# Patient Record
Sex: Male | Born: 1935
Health system: Southern US, Community
[De-identification: ages and names within clinical notes are randomized; demographics above are authoritative.]

## PROBLEM LIST (undated history)

## (undated) DIAGNOSIS — J90 Pleural effusion, not elsewhere classified: Secondary | ICD-10-CM

## (undated) DIAGNOSIS — K449 Diaphragmatic hernia without obstruction or gangrene: Secondary | ICD-10-CM

## (undated) DIAGNOSIS — K5790 Diverticulosis of intestine, part unspecified, without perforation or abscess without bleeding: Secondary | ICD-10-CM

## (undated) DIAGNOSIS — E039 Hypothyroidism, unspecified: Secondary | ICD-10-CM

## (undated) DIAGNOSIS — I480 Paroxysmal atrial fibrillation: Secondary | ICD-10-CM

## (undated) DIAGNOSIS — T4145XA Adverse effect of unspecified anesthetic, initial encounter: Secondary | ICD-10-CM

## (undated) DIAGNOSIS — Z860100 Personal history of colon polyps, unspecified: Secondary | ICD-10-CM

## (undated) DIAGNOSIS — I2510 Atherosclerotic heart disease of native coronary artery without angina pectoris: Secondary | ICD-10-CM

## (undated) DIAGNOSIS — Z8679 Personal history of other diseases of the circulatory system: Secondary | ICD-10-CM

## (undated) DIAGNOSIS — C61 Malignant neoplasm of prostate: Secondary | ICD-10-CM

## (undated) DIAGNOSIS — I10 Essential (primary) hypertension: Secondary | ICD-10-CM

## (undated) DIAGNOSIS — Z9289 Personal history of other medical treatment: Secondary | ICD-10-CM

## (undated) DIAGNOSIS — Z951 Presence of aortocoronary bypass graft: Secondary | ICD-10-CM

## (undated) DIAGNOSIS — K589 Irritable bowel syndrome without diarrhea: Secondary | ICD-10-CM

## (undated) DIAGNOSIS — T8859XA Other complications of anesthesia, initial encounter: Secondary | ICD-10-CM

## (undated) DIAGNOSIS — Z9889 Other specified postprocedural states: Secondary | ICD-10-CM

## (undated) HISTORY — DX: Personal history of colon polyps, unspecified: Z86.0100

## (undated) HISTORY — DX: Personal history of other medical treatment: Z92.89

## (undated) HISTORY — DX: Irritable bowel syndrome, unspecified: K58.9

## (undated) HISTORY — PX: PROSTATE BIOPSY: SHX241

## (undated) HISTORY — DX: Diaphragmatic hernia without obstruction or gangrene: K44.9

## (undated) HISTORY — DX: Essential (primary) hypertension: I10

## (undated) HISTORY — DX: Personal history of colonic polyps: Z86.010

## (undated) HISTORY — DX: Malignant neoplasm of prostate: C61

## (undated) HISTORY — DX: Diverticulosis of intestine, part unspecified, without perforation or abscess without bleeding: K57.90

## (undated) HISTORY — PX: HEMORRHOID SURGERY: SHX153

## (undated) HISTORY — DX: Hypothyroidism, unspecified: E03.9

---

## 1989-02-22 DIAGNOSIS — C61 Malignant neoplasm of prostate: Secondary | ICD-10-CM

## 1989-02-22 HISTORY — DX: Malignant neoplasm of prostate: C61

## 2000-09-01 ENCOUNTER — Other Ambulatory Visit: Admission: RE | Admit: 2000-09-01 | Discharge: 2000-09-01 | Payer: Self-pay | Admitting: Urology

## 2000-09-01 ENCOUNTER — Encounter (INDEPENDENT_AMBULATORY_CARE_PROVIDER_SITE_OTHER): Payer: Self-pay | Admitting: Specialist

## 2000-09-12 ENCOUNTER — Other Ambulatory Visit: Admission: RE | Admit: 2000-09-12 | Discharge: 2000-09-12 | Payer: Self-pay | Admitting: Gastroenterology

## 2000-09-12 ENCOUNTER — Encounter (INDEPENDENT_AMBULATORY_CARE_PROVIDER_SITE_OTHER): Payer: Self-pay

## 2000-12-24 ENCOUNTER — Encounter: Payer: Self-pay | Admitting: Emergency Medicine

## 2000-12-24 ENCOUNTER — Emergency Department (HOSPITAL_COMMUNITY): Admission: EM | Admit: 2000-12-24 | Discharge: 2000-12-24 | Payer: Self-pay | Admitting: Emergency Medicine

## 2001-10-27 ENCOUNTER — Encounter: Payer: Self-pay | Admitting: General Surgery

## 2001-11-02 ENCOUNTER — Encounter (INDEPENDENT_AMBULATORY_CARE_PROVIDER_SITE_OTHER): Payer: Self-pay | Admitting: *Deleted

## 2001-11-02 ENCOUNTER — Ambulatory Visit (HOSPITAL_COMMUNITY): Admission: RE | Admit: 2001-11-02 | Discharge: 2001-11-02 | Payer: Self-pay | Admitting: General Surgery

## 2002-07-28 ENCOUNTER — Encounter: Payer: Self-pay | Admitting: *Deleted

## 2002-07-28 ENCOUNTER — Emergency Department (HOSPITAL_COMMUNITY): Admission: EM | Admit: 2002-07-28 | Discharge: 2002-07-28 | Payer: Self-pay | Admitting: *Deleted

## 2002-07-29 ENCOUNTER — Emergency Department (HOSPITAL_COMMUNITY): Admission: EM | Admit: 2002-07-29 | Discharge: 2002-07-29 | Payer: Self-pay | Admitting: Emergency Medicine

## 2002-07-29 ENCOUNTER — Encounter: Payer: Self-pay | Admitting: Emergency Medicine

## 2004-02-26 ENCOUNTER — Ambulatory Visit: Payer: Self-pay | Admitting: Family Medicine

## 2004-05-09 ENCOUNTER — Emergency Department (HOSPITAL_COMMUNITY): Admission: EM | Admit: 2004-05-09 | Discharge: 2004-05-09 | Payer: Self-pay | Admitting: Emergency Medicine

## 2004-06-02 ENCOUNTER — Ambulatory Visit: Payer: Self-pay | Admitting: Family Medicine

## 2004-07-16 ENCOUNTER — Ambulatory Visit: Payer: Self-pay | Admitting: Family Medicine

## 2004-08-04 ENCOUNTER — Ambulatory Visit: Payer: Self-pay | Admitting: Family Medicine

## 2004-08-13 ENCOUNTER — Ambulatory Visit: Payer: Self-pay | Admitting: Family Medicine

## 2004-12-31 ENCOUNTER — Ambulatory Visit: Payer: Self-pay | Admitting: Family Medicine

## 2005-07-29 ENCOUNTER — Ambulatory Visit: Payer: Self-pay | Admitting: Family Medicine

## 2005-08-05 ENCOUNTER — Ambulatory Visit: Payer: Self-pay | Admitting: Family Medicine

## 2005-12-17 ENCOUNTER — Ambulatory Visit: Payer: Self-pay | Admitting: Family Medicine

## 2006-07-21 ENCOUNTER — Ambulatory Visit: Payer: Self-pay | Admitting: Family Medicine

## 2006-07-21 LAB — CONVERTED CEMR LAB
ALT: 20 units/L (ref 0–40)
AST: 19 units/L (ref 0–37)
Albumin: 4.1 g/dL (ref 3.5–5.2)
Alkaline Phosphatase: 56 units/L (ref 39–117)
BUN: 11 mg/dL (ref 6–23)
Basophils Absolute: 0 10*3/uL (ref 0.0–0.1)
Basophils Relative: 0.4 % (ref 0.0–1.0)
Bilirubin, Direct: 0.1 mg/dL (ref 0.0–0.3)
CO2: 31 meq/L (ref 19–32)
Calcium: 9.2 mg/dL (ref 8.4–10.5)
Chloride: 106 meq/L (ref 96–112)
Cholesterol: 233 mg/dL (ref 0–200)
Creatinine, Ser: 1 mg/dL (ref 0.4–1.5)
Direct LDL: 163.8 mg/dL
Eosinophils Absolute: 0.1 10*3/uL (ref 0.0–0.6)
Eosinophils Relative: 1.5 % (ref 0.0–5.0)
GFR calc Af Amer: 93 mL/min
GFR calc non Af Amer: 77 mL/min
Glucose, Bld: 94 mg/dL (ref 70–99)
HCT: 48.3 % (ref 39.0–52.0)
HDL: 31.1 mg/dL — ABNORMAL LOW (ref >39.0)
Hemoglobin: 16.6 g/dL (ref 13.0–17.0)
Lymphocytes Relative: 32.2 % (ref 12.0–46.0)
MCHC: 34.3 g/dL (ref 30.0–36.0)
MCV: 90.2 fL (ref 78.0–100.0)
Monocytes Absolute: 0.5 10*3/uL (ref 0.2–0.7)
Monocytes Relative: 8.7 % (ref 3.0–11.0)
Neutro Abs: 3.6 10*3/uL (ref 1.4–7.7)
Neutrophils Relative %: 57.2 % (ref 43.0–77.0)
PSA: 12.47 ng/mL — ABNORMAL HIGH (ref 0.10–4.00)
Platelets: 209 10*3/uL (ref 150–400)
Potassium: 3.9 meq/L (ref 3.5–5.1)
RBC: 5.36 M/uL (ref 4.22–5.81)
RDW: 12.5 % (ref 11.5–14.6)
Sodium: 142 meq/L (ref 135–145)
TSH: 3.04 microintl units/mL (ref 0.35–5.50)
Total Bilirubin: 1.2 mg/dL (ref 0.3–1.2)
Total CHOL/HDL Ratio: 7.5
Total Protein: 6.9 g/dL (ref 6.0–8.3)
Triglycerides: 189 mg/dL — ABNORMAL HIGH (ref 0–149)
VLDL: 38 mg/dL (ref 0–40)
WBC: 6.2 10*3/uL (ref 4.5–10.5)

## 2006-09-05 DIAGNOSIS — Z860100 Personal history of colon polyps, unspecified: Secondary | ICD-10-CM | POA: Insufficient documentation

## 2006-09-05 DIAGNOSIS — M129 Arthropathy, unspecified: Secondary | ICD-10-CM | POA: Insufficient documentation

## 2006-09-05 DIAGNOSIS — I10 Essential (primary) hypertension: Secondary | ICD-10-CM | POA: Insufficient documentation

## 2006-12-15 ENCOUNTER — Ambulatory Visit: Payer: Self-pay | Admitting: Family Medicine

## 2006-12-15 DIAGNOSIS — E785 Hyperlipidemia, unspecified: Secondary | ICD-10-CM | POA: Insufficient documentation

## 2006-12-15 DIAGNOSIS — R972 Elevated prostate specific antigen [PSA]: Secondary | ICD-10-CM | POA: Insufficient documentation

## 2006-12-16 LAB — CONVERTED CEMR LAB
Cholesterol: 191 mg/dL (ref 0–200)
HDL: 31.4 mg/dL — ABNORMAL LOW (ref >39.0)
LDL Cholesterol: 134 mg/dL — ABNORMAL HIGH (ref 0–99)
PSA: 12.56 ng/mL — ABNORMAL HIGH (ref 0.10–4.00)
Total CHOL/HDL Ratio: 6.1
Triglycerides: 126 mg/dL (ref 0–149)
VLDL: 25 mg/dL (ref 0–40)

## 2007-05-17 ENCOUNTER — Ambulatory Visit: Payer: Self-pay | Admitting: Family Medicine

## 2007-05-18 LAB — CONVERTED CEMR LAB
Cholesterol: 215 mg/dL (ref 0–200)
Direct LDL: 159.3 mg/dL
HDL: 33 mg/dL — ABNORMAL LOW (ref >39.0)
PSA: 13.37 ng/mL — ABNORMAL HIGH (ref 0.10–4.00)
Total CHOL/HDL Ratio: 6.5
Triglycerides: 137 mg/dL (ref 0–149)
VLDL: 27 mg/dL (ref 0–40)

## 2007-08-02 ENCOUNTER — Other Ambulatory Visit: Payer: Self-pay | Admitting: Family Medicine

## 2007-08-02 ENCOUNTER — Ambulatory Visit: Payer: Self-pay | Admitting: Family Medicine

## 2007-08-02 DIAGNOSIS — M949 Disorder of cartilage, unspecified: Secondary | ICD-10-CM

## 2007-08-02 DIAGNOSIS — T50995A Adverse effect of other drugs, medicaments and biological substances, initial encounter: Secondary | ICD-10-CM | POA: Insufficient documentation

## 2007-08-02 DIAGNOSIS — D649 Anemia, unspecified: Secondary | ICD-10-CM | POA: Insufficient documentation

## 2007-08-02 DIAGNOSIS — M899 Disorder of bone, unspecified: Secondary | ICD-10-CM | POA: Insufficient documentation

## 2007-08-02 DIAGNOSIS — K219 Gastro-esophageal reflux disease without esophagitis: Secondary | ICD-10-CM | POA: Insufficient documentation

## 2007-08-02 DIAGNOSIS — R350 Frequency of micturition: Secondary | ICD-10-CM | POA: Insufficient documentation

## 2007-08-02 DIAGNOSIS — E039 Hypothyroidism, unspecified: Secondary | ICD-10-CM | POA: Insufficient documentation

## 2007-08-02 LAB — CONVERTED CEMR LAB
Bilirubin Urine: NEGATIVE
Blood in Urine, dipstick: NEGATIVE
Glucose, Urine, Semiquant: NEGATIVE
Ketones, urine, test strip: NEGATIVE
Nitrite: NEGATIVE
PSA: ABNORMAL ng/mL
Protein, U semiquant: NEGATIVE
Specific Gravity, Urine: 1.025
Urobilinogen, UA: 0.2
WBC Urine, dipstick: NEGATIVE
pH: 5

## 2007-08-04 LAB — CONVERTED CEMR LAB
AST: 22 units/L (ref 0–37)
Basophils Absolute: 0 10*3/uL (ref 0.0–0.1)
Basophils Relative: 0.1 % (ref 0.0–1.0)
Bilirubin, Direct: 0.1 mg/dL (ref 0.0–0.3)
Calcium: 8.8 mg/dL (ref 8.4–10.5)
Chloride: 106 meq/L (ref 96–112)
Cholesterol: 181 mg/dL (ref 0–200)
Creatinine, Ser: 1 mg/dL (ref 0.4–1.5)
Eosinophils Absolute: 0.1 10*3/uL (ref 0.0–0.7)
GFR calc non Af Amer: 77 mL/min
HDL: 30.4 mg/dL — ABNORMAL LOW (ref >39.0)
LDL Cholesterol: 127 mg/dL — ABNORMAL HIGH (ref 0–99)
MCHC: 35.9 g/dL (ref 30.0–36.0)
MCV: 90.7 fL (ref 78.0–100.0)
Neutrophils Relative %: 58.9 % (ref 43.0–77.0)
PSA: 13.01 ng/mL — ABNORMAL HIGH (ref 0.10–4.00)
RBC: 4.79 M/uL (ref 4.22–5.81)
RDW: 12.3 % (ref 11.5–14.6)
Sodium: 140 meq/L (ref 135–145)
TSH: 2.49 microintl units/mL (ref 0.35–5.50)
Total Bilirubin: 1 mg/dL (ref 0.3–1.2)
Triglycerides: 117 mg/dL (ref 0–149)
VLDL: 23 mg/dL (ref 0–40)

## 2007-08-11 ENCOUNTER — Ambulatory Visit: Payer: Self-pay | Admitting: Family Medicine

## 2007-08-11 LAB — CONVERTED CEMR LAB
OCCULT 1: POSITIVE
OCCULT 2: POSITIVE
OCCULT 3: NEGATIVE

## 2007-08-31 ENCOUNTER — Ambulatory Visit: Payer: Self-pay | Admitting: Family Medicine

## 2007-09-05 ENCOUNTER — Encounter: Payer: Self-pay | Admitting: Family Medicine

## 2007-11-21 ENCOUNTER — Telehealth: Payer: Self-pay | Admitting: Family Medicine

## 2007-12-06 ENCOUNTER — Ambulatory Visit: Payer: Self-pay | Admitting: Family Medicine

## 2008-07-29 ENCOUNTER — Ambulatory Visit: Payer: Self-pay | Admitting: Family Medicine

## 2008-08-08 ENCOUNTER — Ambulatory Visit: Payer: Self-pay | Admitting: Family Medicine

## 2008-08-08 DIAGNOSIS — M722 Plantar fascial fibromatosis: Secondary | ICD-10-CM | POA: Insufficient documentation

## 2008-08-08 DIAGNOSIS — R011 Cardiac murmur, unspecified: Secondary | ICD-10-CM | POA: Insufficient documentation

## 2008-08-08 LAB — CONVERTED CEMR LAB
ALT: 17 U/L (ref 0–53)
AST: 17 U/L (ref 0–37)
Albumin: 3.6 g/dL (ref 3.5–5.2)
Alkaline Phosphatase: 56 U/L (ref 39–117)
BUN: 17 mg/dL (ref 6–23)
Basophils Absolute: 0 K/uL (ref 0.0–0.1)
Basophils Relative: 0.2 % (ref 0.0–3.0)
Bilirubin Urine: NEGATIVE
Bilirubin, Direct: 0.1 mg/dL (ref 0.0–0.3)
CO2: 29 meq/L (ref 19–32)
Calcium: 8.8 mg/dL (ref 8.4–10.5)
Chloride: 111 meq/L (ref 96–112)
Cholesterol: 189 mg/dL (ref 0–200)
Creatinine, Ser: 0.9 mg/dL (ref 0.4–1.5)
Eosinophils Absolute: 0.1 K/uL (ref 0.0–0.7)
Eosinophils Relative: 2 % (ref 0.0–5.0)
GFR calc non Af Amer: 86.55 mL/min (ref >60)
Glucose, Bld: 87 mg/dL (ref 70–99)
HCT: 45.5 % (ref 39.0–52.0)
HDL: 34.9 mg/dL — ABNORMAL LOW (ref >39.00)
Hemoglobin: 16.1 g/dL (ref 13.0–17.0)
Ketones, ur: NEGATIVE mg/dL
LDL Cholesterol: 128 mg/dL — ABNORMAL HIGH (ref 0–99)
Leukocytes, UA: NEGATIVE
Lymphocytes Relative: 37.6 % (ref 12.0–46.0)
Lymphs Abs: 2.1 K/uL (ref 0.7–4.0)
MCHC: 35.5 g/dL (ref 30.0–36.0)
MCV: 89.8 fL (ref 78.0–100.0)
Monocytes Absolute: 0.5 K/uL (ref 0.1–1.0)
Monocytes Relative: 8.3 % (ref 3.0–12.0)
Neutro Abs: 2.8 K/uL (ref 1.4–7.7)
Neutrophils Relative %: 51.9 % (ref 43.0–77.0)
Nitrite: NEGATIVE
PSA: 18.38 ng/mL — ABNORMAL HIGH (ref 0.10–4.00)
Platelets: 170 K/uL (ref 150.0–400.0)
Potassium: 4.2 meq/L (ref 3.5–5.1)
RBC: 5.06 M/uL (ref 4.22–5.81)
RDW: 12.2 % (ref 11.5–14.6)
Sodium: 143 meq/L (ref 135–145)
Specific Gravity, Urine: >=1.03 (ref 1.000–1.030)
TSH: 2.93 u[IU]/mL (ref 0.35–5.50)
Total Bilirubin: 1.1 mg/dL (ref 0.3–1.2)
Total CHOL/HDL Ratio: 5
Total Protein, Urine: NEGATIVE mg/dL
Total Protein: 6.7 g/dL (ref 6.0–8.3)
Triglycerides: 132 mg/dL (ref 0.0–149.0)
Urine Glucose: NEGATIVE mg/dL
Urobilinogen, UA: 0.2 (ref 0.0–1.0)
VLDL: 26.4 mg/dL (ref 0.0–40.0)
WBC: 5.5 10*3/microliter (ref 4.5–10.5)
pH: 5.5 (ref 5.0–8.0)

## 2008-08-08 LAB — HM COLONOSCOPY

## 2008-08-13 ENCOUNTER — Encounter: Payer: Self-pay | Admitting: Gastroenterology

## 2008-08-21 ENCOUNTER — Ambulatory Visit: Payer: Self-pay

## 2008-08-21 ENCOUNTER — Encounter: Payer: Self-pay | Admitting: Family Medicine

## 2008-09-04 ENCOUNTER — Ambulatory Visit: Payer: Self-pay | Admitting: Family Medicine

## 2008-09-04 LAB — CONVERTED CEMR LAB
OCCULT 1: NEGATIVE
OCCULT 3: NEGATIVE

## 2008-11-06 ENCOUNTER — Telehealth: Payer: Self-pay | Admitting: Family Medicine

## 2009-01-01 ENCOUNTER — Encounter (INDEPENDENT_AMBULATORY_CARE_PROVIDER_SITE_OTHER): Payer: Self-pay | Admitting: *Deleted

## 2009-01-22 ENCOUNTER — Encounter: Payer: Self-pay | Admitting: Family Medicine

## 2009-02-04 ENCOUNTER — Telehealth: Payer: Self-pay | Admitting: Family Medicine

## 2009-02-05 ENCOUNTER — Ambulatory Visit: Payer: Self-pay | Admitting: Family Medicine

## 2009-02-07 ENCOUNTER — Telehealth (INDEPENDENT_AMBULATORY_CARE_PROVIDER_SITE_OTHER): Payer: Self-pay | Admitting: *Deleted

## 2009-02-18 ENCOUNTER — Encounter: Payer: Self-pay | Admitting: Family Medicine

## 2009-02-22 HISTORY — PX: COLONOSCOPY: SHX174

## 2009-02-24 ENCOUNTER — Encounter: Payer: Self-pay | Admitting: Family Medicine

## 2009-03-11 ENCOUNTER — Encounter: Payer: Self-pay | Admitting: Family Medicine

## 2009-03-11 LAB — CONVERTED CEMR LAB
CO2: 31 meq/L (ref 19–32)
Calcium: 9.5 mg/dL (ref 8.4–10.5)
GFR calc non Af Amer: 99.02 mL/min (ref >60)
Sodium: 140 meq/L (ref 135–145)

## 2009-06-20 ENCOUNTER — Ambulatory Visit: Payer: Self-pay | Admitting: Family Medicine

## 2009-06-20 LAB — CONVERTED CEMR LAB: PSA: 22.56 ng/mL — ABNORMAL HIGH (ref 0.10–4.00)

## 2009-08-06 ENCOUNTER — Ambulatory Visit: Payer: Self-pay | Admitting: Family Medicine

## 2009-08-13 ENCOUNTER — Ambulatory Visit: Payer: Self-pay | Admitting: Family Medicine

## 2009-08-13 DIAGNOSIS — L723 Sebaceous cyst: Secondary | ICD-10-CM | POA: Insufficient documentation

## 2009-08-13 DIAGNOSIS — R059 Cough, unspecified: Secondary | ICD-10-CM | POA: Insufficient documentation

## 2009-08-13 DIAGNOSIS — R05 Cough: Secondary | ICD-10-CM

## 2009-08-13 DIAGNOSIS — C61 Malignant neoplasm of prostate: Secondary | ICD-10-CM | POA: Insufficient documentation

## 2009-08-13 LAB — CONVERTED CEMR LAB
Bilirubin Urine: NEGATIVE
Bilirubin, Direct: 0.1 mg/dL (ref 0.0–0.3)
Cholesterol: 178 mg/dL (ref 0–200)
Eosinophils Absolute: 0.1 10*3/uL (ref 0.0–0.7)
GFR calc non Af Amer: 90.98 mL/min (ref >60)
HDL: 35.9 mg/dL — ABNORMAL LOW (ref >39.00)
Leukocytes, UA: NEGATIVE
Lymphs Abs: 2.1 10*3/uL (ref 0.7–4.0)
MCHC: 34.9 g/dL (ref 30.0–36.0)
MCV: 92.5 fL (ref 78.0–100.0)
Monocytes Absolute: 0.5 10*3/uL (ref 0.1–1.0)
Neutrophils Relative %: 53.5 % (ref 43.0–77.0)
Nitrite: NEGATIVE
PSA: 23.38 ng/mL — ABNORMAL HIGH (ref 0.10–4.00)
Platelets: 185 10*3/uL (ref 150.0–400.0)
Potassium: 4.3 meq/L (ref 3.5–5.1)
Sodium: 142 meq/L (ref 135–145)
TSH: 3.67 microintl units/mL (ref 0.35–5.50)
Total Bilirubin: 0.8 mg/dL (ref 0.3–1.2)
Total Protein: 6.3 g/dL (ref 6.0–8.3)
Triglycerides: 153 mg/dL — ABNORMAL HIGH (ref 0.0–149.0)
Urobilinogen, UA: 0.2 (ref 0.0–1.0)
VLDL: 30.6 mg/dL (ref 0.0–40.0)
pH: 6 (ref 5.0–8.0)

## 2009-08-29 ENCOUNTER — Ambulatory Visit: Payer: Self-pay | Admitting: Family Medicine

## 2009-10-16 ENCOUNTER — Ambulatory Visit: Payer: Self-pay | Admitting: Family Medicine

## 2009-10-21 LAB — CONVERTED CEMR LAB: OCCULT 3: NEGATIVE

## 2009-12-31 ENCOUNTER — Ambulatory Visit: Payer: Self-pay | Admitting: Family Medicine

## 2010-03-26 NOTE — Procedures (Signed)
Summary: Recall / [facility]  Recall / [facility]   Imported By: Lennie Odor 07/25/2009 14:07:42  _____________________________________________________________________  External Attachment:    Type:   Image     Comment:   External Document

## 2010-03-26 NOTE — Assessment & Plan Note (Signed)
Summary: FLU-SHOT/RCD   Nurse Visit   Allergies: No Known Drug Allergies  Orders Added: 1)  Admin 1st Vaccine [90471] 2)  Flu Vaccine 65yrs + [52841]                Flu Vaccine Consent Questions     Do you have a history of severe allergic reactions to this vaccine? no    Any prior history of allergic reactions to egg and/or gelatin? no    Do you have a sensitivity to the preservative Thimersol? no    Do you have a past history of Guillan-Barre Syndrome? no    Do you currently have an acute febrile illness? no    Have you ever had a severe reaction to latex? no    Vaccine information given and explained to patient? yes    Are you currently pregnant? no    Lot Number:[id number]   Exp Date:08/22/2010   Site Given  Left Deltoid IMu Romualdo Bolk, CMA (AAMA)  December 31, 2009 10:24 AM

## 2010-03-26 NOTE — Letter (Signed)
Summary: [facility]  [facility]   Imported By: Maryln Gottron 02/27/2009 11:02:02  _____________________________________________________________________  External Attachment:    Type:   Image     Comment:   External Document

## 2010-03-26 NOTE — Letter (Signed)
Summary: Letter from patient  Letter from patient   Imported By: Maryln Gottron 04/18/2009 13:16:05  _____________________________________________________________________  External Attachment:    Type:   Image     Comment:   External Document

## 2010-03-26 NOTE — Letter (Signed)
Summary: [facility]  [facility]   Imported By: Maryln Gottron 04/30/2009 13:03:42  _____________________________________________________________________  External Attachment:    Type:   Image     Comment:   External Document

## 2010-03-26 NOTE — Assessment & Plan Note (Signed)
Summary: cpx/njr   Vital Signs:  Patient profile:   77 year old male Height:      70 inches Weight:      198 pounds BMI:     28.51 O2 Sat:      95 % Temp:     98.1 degrees F Pulse rate:   82 / minute Pulse rhythm:   regular BP sitting:   130 / 82  (left arm) Cuff size:   regular  Vitals Entered By: Pura Spice, RN (August 13, 2009 8:30 AM) CC: go over problems  refill meds.     History of Present Illness: This 77 year old white married male he is in today to discuss his medical problems and refill his meds her medications. He has had proximal leg cancer diagnosed and has been watching but now is planning to help hold on therapy Also has a multiple right thigh would like to be examined Past urinary frequency no nocturia no dysuria and no difficulty urinating Blood pressure has been well-controlle has occasional unexplained cough  Allergies (verified): No Known Drug Allergies  Past History:  Past Surgical History: Last updated: 09/05/2006 Cholecystectomy Hemorrhoidectomy Prostate Bx Colonoscopy-10/02/2004  Family History: Last updated: 09/05/2006 Family History Diabetes 1st degree relative Family History Hypertension Family History Lung cancer Family History of Sudden Death Family History of Cardiovascular disorder Fam hx Stroke  Social History: Last updated: 09/05/2006 Retired Married Never Smoked Alcohol use-yes Drug use-no Regular exercise-no  Past Medical History: Colonic polyps, hx of Hypertension prostate cancer   Past History:  Care Management: Urology: Dr Sindy Guadeloupe   Ophthalmology: Dr Nile Riggs Dermatology: Terri Piedra   Review of Systems      See HPI  The patient denies anorexia, fever, weight loss, weight gain, vision loss, decreased hearing, hoarseness, chest pain, syncope, dyspnea on exertion, peripheral edema, prolonged cough, headaches, hemoptysis, abdominal pain, melena, hematochezia, severe indigestion/heartburn, hematuria, incontinence,  genital sores, muscle weakness, suspicious skin lesions, transient blindness, difficulty walking, depression, unusual weight change, abnormal bleeding, enlarged lymph nodes, angioedema, breast masses, and testicular masses.    Physical Exam  General:  Well-developed,well-nourished,in no acute distress; alert,appropriate and cooperative throughout examination Head:  Normocephalic and atraumatic without obvious abnormalities. No apparent alopecia or balding. Eyes:  No corneal or conjunctival inflammation noted. EOMI. Perrla. Funduscopic exam benign, without hemorrhages, exudates or papilledema. Vision grossly normal. Ears:  External ear exam shows no significant lesions or deformities.  Otoscopic examination reveals clear canals, tympanic membranes are intact bilaterally without bulging, retraction, inflammation or discharge. Hearing is grossly normal bilaterally. Nose:  External nasal examination shows no deformity or inflammation. Nasal mucosa are pink and moist without lesions or exudates. Mouth:  Oral mucosa and oropharynx without lesions or exudates.  Teeth in good repair. Neck:  No deformities, masses, or tenderness noted. Chest Wall:  No deformities, masses, tenderness or gynecomastia noted. Breasts:  No masses or gynecomastia noted Lungs:  Normal respiratory effort, chest expands symmetrically. Lungs are clear to auscultation, no crackles or wheezes. Heart:  grade 1-2  systolic aortic murmur, unchanged Abdomen:  Bowel sounds positive,abdomen soft and non-tender without masses, organomegaly or hernias noted. Rectal:  urolgist Prostate:  uROLOGIS EXAMINED Msk:  No deformity or scoliosis noted of thoracic or lumbar spine.   Pulses:  R and L carotid,radial,femoral,dorsalis pedis and posterior tibial pulses are full and equal bilaterally Extremities:  No clubbing, cyanosis, edema, or deformity noted with normal full range of motion of all joints.   Neurologic:  No cranial nerve deficits noted.  Station and gait are normal. Plantar reflexes are down-going bilaterally. DTRs are symmetrical throughout. Sensory, motor and coordinative functions appear intact. Skin:  O.5 CM sebaceous cyst expressed easily Cervical Nodes:  No lymphadenopathy noted Axillary Nodes:  No palpable lymphadenopathy Inguinal Nodes:  No significant adenopathy Psych:  Cognition and judgment appear intact. Alert and cooperative with normal attention span and concentration. No apparent delusions, illusions, hallucinations   Impression & Recommendations:  Problem # 1:  SEBACEOUS CYST (ICD-706.2) Assessment New  expressed, no other tx  Orders: Prescription Created Electronically [phone])  Problem # 2:  ADENOCARCINOMA, PROSTATE, GLEASON GRADE 3 (ICD-185) Assessment: New  Under care M>D> at [facility]  Orders: Prescription Created Electronically [phone])  Problem # 3:  HEART MURMUR, SYSTOLIC (ICD-785.2) Assessment: Unchanged aortic murmur unchanged grade 2  Problem # 4:  HYPERTENSION (ICD-401.9) Assessment: Improved  His updated medication list for this problem includes:    Lisinopril 20 Mg Tabs (Lisinopril) .[name]... 1 by mouth once daily  Orders: Prescription Created Electronically [phone])  Complete Medication List: 1)  Lisinopril 20 Mg Tabs (Lisinopril) .[name].. 1 by mouth once daily 2)  Aspirin Ec 325 Mg Tbec (Aspirin)  Other Orders: T-2 View CXR ([id number])  Patient Instructions: 1)  Pt doing fine except prostate cancer, withPSA increasing 2)  considering hormone therapy 3)  refilled lisinopril for BP 4)  to get chest Xray for unexplained periodic coughin Prescriptions: LISINOPRIL 20 MG  TABS (LISINOPRIL) 1 by mouth once daily  #30 x 11   Entered and Authorized by:   Judithann Sheen MD   Signed by:   Judithann Sheen MD on 08/13/2009   Method used:   Electronically to        [facility]  [address] [phone]* (retail)       [address]       [place], [place]  [address]        Ph: [phone] or [phone]       Fax: [phone]   RxID:   [id number]

## 2010-05-14 ENCOUNTER — Other Ambulatory Visit: Payer: Self-pay | Admitting: Family Medicine

## 2010-05-14 ENCOUNTER — Telehealth: Payer: Self-pay | Admitting: Family Medicine

## 2010-05-14 ENCOUNTER — Other Ambulatory Visit: Payer: Self-pay

## 2010-05-14 DIAGNOSIS — R972 Elevated prostate specific antigen [PSA]: Secondary | ICD-10-CM

## 2010-05-14 DIAGNOSIS — Z0000 Encounter for general adult medical examination without abnormal findings: Secondary | ICD-10-CM

## 2010-05-14 NOTE — Telephone Encounter (Signed)
[facility] called and needed lab orders put in for pt.  done

## 2010-05-19 ENCOUNTER — Emergency Department (HOSPITAL_COMMUNITY)
Admission: EM | Admit: 2010-05-19 | Discharge: 2010-05-19 | Disposition: A | Discharge status: Home / Self Care | Payer: Medicare Other | Attending: Emergency Medicine | Admitting: Emergency Medicine

## 2010-05-19 DIAGNOSIS — S0180XA Unspecified open wound of other part of head, initial encounter: Secondary | ICD-10-CM | POA: Insufficient documentation

## 2010-05-19 DIAGNOSIS — W1809XA Striking against other object with subsequent fall, initial encounter: Secondary | ICD-10-CM | POA: Insufficient documentation

## 2010-07-10 NOTE — Op Note (Signed)
TNAMEJAMAAR, HOWES NO.:  [id number]   MEDICAL RECORD NO.:  [id number]                   PATIENT TYPE:  AMB   LOCATION:  DAY                                  FACILITY:  [facility]   PHYSICIAN:  Timothy E. Earlene Plater, M.D.              DATE OF BIRTH:  1927-09-25   DATE OF PROCEDURE:  11/02/2001  DATE OF DISCHARGE:                                 OPERATIVE REPORT   PREOPERATIVE DIAGNOSES:  Internal and external hemorrhoids, mucosal prolapse  syndrome.   POSTOPERATIVE DIAGNOSES:  Internal and external hemorrhoids, mucosal  prolapse syndrome.   PROCEDURE:  TPH anopexy, complex hemorrhoidectomy.   SURGEON:  Timothy E. Earlene Plater, M.D.   ASSISTANT:  Ollen Gross. Carolynne Edouard, M.D.   ANESTHESIA:  CRNA supervised M.D.   INDICATIONS FOR PROCEDURE:  Mr. Cuffee has been seen and followed in the  office for hemorrhoidal protrusion, soilage and bleeding. He has been  treated conservatively but surgery has been recommended. Because of his  circumferential hemorrhoidal prolapse not clinical rectal prolapse, a TPH  anopexy has been recommended. Outside consultation was offered, he elected  to stay in [place]. He was prepared at home. His preoperative laboratory  data, CBC, chemistry profile, chest x-ray and EKG were completely normal. He  was identified and the permit signed.   DESCRIPTION OF PROCEDURE:  The patient was taken to the operating room,  placed supine, general endotracheal anesthesia administered. He was then  placed in the lithotomy position, perianal area was inspected, prepped and  draped in the usual fashion. The anus was patulous but the sphincters were  intact, gentle dilatation was accomplished and anoscopy carried out. No new  findings. The anus was injected around and about with 20 cc of 0.5% Marcaine  with epinephrine mixed 1:20 with Wydase. This was carried out around and  about the anal orifice and deep into the sphincters. The anterior of the  anal rectum  was avoided. This was massaged in well. Then using the standard  ASCRS and Eth[employer] protocol, the [facility] stapler device kit was used, the  introducer was placed, the anus was dilated. This was then replaced with a  33 mm fandular anoscope which was carefully placed and positioned and then  using a 2-0 Prolene suture on an SH needle, a pursestring suture was applied  to the mucosa only at approximately 5 cm proximal to the dentate line. Once  this had been secured and inspected and examined and found to be  satisfactory, the [facility] stapler was opened completely. The head of the staple  device was passed above the pursestring suture and then the remainder of the  stapler was carefully positioned in the anorectal canal and the stapling  device was then carefully closed completely. Pressure was held for 30  seconds. The stapler was fired and pressure was held for 30 seconds and then  the stapler was gently retracted. A gauze was  placed in the anal canal. The  stapler was taken to the back table and a complete ring of hemorrhoidal  mucosa was completely removed. This measured in longitudinal distance to be  2-3 cm. The anal rectum was then carefully inspected and examined for the  next 15 minutes to check for any bleeding. A single 3-0 Vicryl suture was  placed posteriorly. A second suture was placed in the left lateral position  at the staple line. The staple line was a little more distal than we would  have liked. It was approximately 1 cm proximal to the dentate line. The  prolapse was repaired, minimal anodermal hemorrhoids were still present.  This was as expected. Again after careful inspection and watchful waiting,  there was no evidence of bleeding or complications and the procedure was  complete. Gelfoam gauze was placed in the anal canal. A dry sterile dressing  was applied. He was then removed to the recovery room in good condition.   I have spoke with the patient in the day surgery  discharge area and careful  instructions have been given verbally and written. A prescription for  Vicodin was given for #36 to use as needed. He will be followed carefully as  an outpatient.                                               Timothy E. Earlene Plater, M.D.    TED/MEDQ  D:  11/02/2001  T:  11/02/2001  Job:  [id number]

## 2010-08-13 ENCOUNTER — Other Ambulatory Visit (INDEPENDENT_AMBULATORY_CARE_PROVIDER_SITE_OTHER): Payer: Medicare Other | Admitting: Family Medicine

## 2010-08-13 ENCOUNTER — Other Ambulatory Visit (INDEPENDENT_AMBULATORY_CARE_PROVIDER_SITE_OTHER): Payer: Medicare Other

## 2010-08-13 ENCOUNTER — Other Ambulatory Visit: Payer: Self-pay | Admitting: Family Medicine

## 2010-08-13 DIAGNOSIS — Z0000 Encounter for general adult medical examination without abnormal findings: Secondary | ICD-10-CM

## 2010-08-13 DIAGNOSIS — Z79899 Other long term (current) drug therapy: Secondary | ICD-10-CM

## 2010-08-13 DIAGNOSIS — I10 Essential (primary) hypertension: Secondary | ICD-10-CM

## 2010-08-13 DIAGNOSIS — I4891 Unspecified atrial fibrillation: Secondary | ICD-10-CM

## 2010-08-13 LAB — CBC WITH DIFFERENTIAL/PLATELET
Basophils Relative: 0.5 % (ref 0.0–3.0)
Eosinophils Relative: 1.4 % (ref 0.0–5.0)
HCT: 46.8 % (ref 39.0–52.0)
Hemoglobin: 16.4 g/dL (ref 13.0–17.0)
Lymphs Abs: 2.4 10*3/uL (ref 0.7–4.0)
Monocytes Relative: 8.3 % (ref 3.0–12.0)
Neutro Abs: 3.2 10*3/uL (ref 1.4–7.7)
Platelets: 207 10*3/uL (ref 150.0–400.0)
RBC: 5.21 Mil/uL (ref 4.22–5.81)
WBC: 6.3 10*3/uL (ref 4.5–10.5)

## 2010-08-13 LAB — URINALYSIS, ROUTINE W REFLEX MICROSCOPIC
Bilirubin Urine: NEGATIVE
Nitrite: NEGATIVE
Total Protein, Urine: NEGATIVE
Urine Glucose: NEGATIVE
pH: 5.5 (ref 5.0–8.0)

## 2010-08-13 LAB — BASIC METABOLIC PANEL
BUN: 17 mg/dL (ref 6–23)
Calcium: 9.1 mg/dL (ref 8.4–10.5)
Chloride: 107 mEq/L (ref 96–112)
Creatinine, Ser: 0.9 mg/dL (ref 0.4–1.5)

## 2010-08-13 LAB — LIPID PANEL
Cholesterol: 186 mg/dL (ref 0–200)
LDL Cholesterol: 126 mg/dL — ABNORMAL HIGH (ref 0–99)
Total CHOL/HDL Ratio: 5

## 2010-08-13 LAB — TSH: TSH: 4.18 u[IU]/mL (ref 0.35–5.50)

## 2010-08-13 LAB — HEPATIC FUNCTION PANEL: Total Bilirubin: 1.2 mg/dL (ref 0.3–1.2)

## 2010-08-13 LAB — PSA: PSA: 23.5 ng/mL — ABNORMAL HIGH (ref 0.10–4.00)

## 2010-08-20 ENCOUNTER — Encounter: Payer: Self-pay | Admitting: Family Medicine

## 2010-08-20 ENCOUNTER — Ambulatory Visit (INDEPENDENT_AMBULATORY_CARE_PROVIDER_SITE_OTHER): Payer: Medicare Other | Admitting: Family Medicine

## 2010-08-20 VITALS — BP 110/80 | HR 97 | Temp 98.7°F | Ht 69.5 in | Wt 183.0 lb

## 2010-08-20 DIAGNOSIS — I10 Essential (primary) hypertension: Secondary | ICD-10-CM

## 2010-08-20 DIAGNOSIS — I4891 Unspecified atrial fibrillation: Secondary | ICD-10-CM

## 2010-08-20 DIAGNOSIS — Z9889 Other specified postprocedural states: Secondary | ICD-10-CM

## 2010-08-20 DIAGNOSIS — K589 Irritable bowel syndrome without diarrhea: Secondary | ICD-10-CM

## 2010-08-20 DIAGNOSIS — Z0000 Encounter for general adult medical examination without abnormal findings: Secondary | ICD-10-CM

## 2010-08-20 DIAGNOSIS — M47812 Spondylosis without myelopathy or radiculopathy, cervical region: Secondary | ICD-10-CM

## 2010-08-20 DIAGNOSIS — Z136 Encounter for screening for cardiovascular disorders: Secondary | ICD-10-CM

## 2010-08-20 MED ORDER — LISINOPRIL 20 MG PO TABS: 20.0000 mg | ORAL_TABLET | Freq: Every day | ORAL | Status: DC

## 2010-08-20 MED ORDER — DICYCLOMINE HCL 20 MG PO TABS: ORAL_TABLET | ORAL | Status: DC

## 2010-08-21 ENCOUNTER — Encounter: Payer: Self-pay | Admitting: Family Medicine

## 2010-08-21 NOTE — Progress Notes (Signed)
  Subjective:    Patient ID: AMI THORNSBERRY, male    DOB: [birth year], 77 y.o.   MRN: [id number] this 77 year old white married male with history of adenocarcinoma prostate lesion grade 3 is in for a yearly physical examination relating that he is on watch and wait treatment for prostate  Hypertension is well controlled 110/80 his new complaints consist of muscle spasm on the right side of the neck and trapezius and this had been over the past few month one of his main complaints at this time and that in the last few months he had been having a change in bowel habits in that he has a normal bowel movement in the morning and after breakfast he has a loose stool overall length of the abdomen but also after lunch no matter what type food more years he has diarrhea no problem at night is having no blood on the other complaint that he has muscle spasm over the left lumbar CVA region also needs to schedule his colonoscopic exam immunizations are up-to-date except for zostavax : Return for this injection laterand he is considering thisHPI    Review of Systems see history of present illness     Objective:   Physical Exam the patient is a well-developed well-nourished white male who does not appear he is given age of 77 he is alert cooperative and very pleasant. HEENT ear nose and throat are negative carotid pulses good ir Lungs clear to palpation percussion and auscultation no dullness no rales no wheezing Heart normal size no murmurs irregular rhythm, electrocardiogram reveals atrial fibrillation slow ventricular rate Abdomen liver spleen and kidneys are nonpalpable no tenderness no masses slightly hyperactive bowel sounds Rectal examination reveals slightly enlarged prostate firm no nodules no pain Spine has tenderness of the cervical spine on the right Trapeziustriradiate pains into the right shoulder Muscle tightness or spasm left paravertebral lumbar spine Neurological examination negative Skin  examination negative        Assessment & Plan:  Hypertension continue Lisinopril Adenocarcinoma prostate continuedare Iof urologist Cervical arthritis to use ibuprofen when he feels indicated or needs Patient fibrillation and slow ventricular rate no treatment indicated this time discussed with cardiologist which he desires to wait Gen. Feels cold health is good Reviewed lab studies PSA has decreased since March

## 2010-08-21 NOTE — Patient Instructions (Signed)
I will schedule a :colon Examination with Dr. Jarold Motto Her year-old bowel syndrome Will prescribe dicyclomine 20 mg 30 minutes before breakfast and lunch Hypertension well controlled continue lisinopril Continued care under the urologist regarding carcinoma of the prostate

## 2010-08-24 ENCOUNTER — Encounter: Payer: Self-pay | Admitting: Gastroenterology

## 2010-09-30 ENCOUNTER — Encounter: Payer: Self-pay | Admitting: *Deleted

## 2010-10-01 ENCOUNTER — Encounter: Payer: Self-pay | Admitting: Gastroenterology

## 2010-10-01 ENCOUNTER — Ambulatory Visit (INDEPENDENT_AMBULATORY_CARE_PROVIDER_SITE_OTHER): Payer: Medicare Other | Admitting: Gastroenterology

## 2010-10-01 VITALS — BP 104/60 | HR 64 | Ht 72.0 in | Wt 193.8 lb

## 2010-10-01 DIAGNOSIS — Z1211 Encounter for screening for malignant neoplasm of colon: Secondary | ICD-10-CM

## 2010-10-01 DIAGNOSIS — Z86010 Personal history of colonic polyps: Secondary | ICD-10-CM

## 2010-10-01 MED ORDER — PEG-KCL-NACL-NASULF-NA ASC-C 100 G PO SOLR: 1.0000 | Freq: Once | ORAL | Status: DC

## 2010-10-01 NOTE — Progress Notes (Signed)
This is an creamy pleasant 77 year old Caucasian male new for followup colonoscopy per her history of recurrent colon polyps. He currently denies any GI symptomatology except for occasional crampy abdominal pain and loose stools alleviated by dicyclomine use prescribed by Dr. Dianna Limbo. Patient has a past history of IBS and previous hemorrhoidectomy. Family history is noncontributory. He denies significant acid reflux symptoms, has been treated for prostate cancer, has had previous cholecystectomy. Appetite is good his weight is stable. There is no history of melena, hematochezia, or systemic complaints such as fever or chills.   Current Medications, Allergies, Past Medical History, Past Surgical History, Family History and Social History were reviewed in [employer] record.  Pertinent Review of Systems Negative   Physical Exam: Elderly appearing elderly patient appearing younger than his stated age. He does have some papular red facial lesion  of Rosacea, and I cannot appreciate stigmata of chronic liver disease. Chest is clear and cardiac exam is unremarkable. I cannot appreciate hepatosplenomegaly, abdominal masses or tenderness. Bowel sounds are normal. Peripheral extremities are unremarkable without edema, phlebitis, or swollen joints. Mental status is normal without gross neurological deficits.    Assessment and Plan: This patient seems to be in excellent health and is a good candidate for followup colonoscopy. He has had colon polyps on several exams, and we have scheduled this followup exam with him at his convenience. I've explained to her new split dose balanced electrolyte prep, and he agrees to proceed as planned. He is continue his other medications as per primary care. Encounter Diagnoses  Name Primary?  . Special screening for malignant neoplasms, colon Yes  . Personal history of colonic polyps

## 2010-10-01 NOTE — Patient Instructions (Signed)
Your prescription(s) have been sent to you pharmacy.  Your procedure has been scheduled for 10/05/2010, please follow the seperate instructions.

## 2010-10-02 ENCOUNTER — Encounter: Payer: Self-pay | Admitting: Gastroenterology

## 2010-10-05 ENCOUNTER — Encounter: Payer: Self-pay | Admitting: Gastroenterology

## 2010-10-05 ENCOUNTER — Ambulatory Visit (AMBULATORY_SURGERY_CENTER): Payer: Medicare Other | Admitting: Gastroenterology

## 2010-10-05 DIAGNOSIS — K5730 Diverticulosis of large intestine without perforation or abscess without bleeding: Secondary | ICD-10-CM | POA: Insufficient documentation

## 2010-10-05 DIAGNOSIS — D126 Benign neoplasm of colon, unspecified: Secondary | ICD-10-CM

## 2010-10-05 DIAGNOSIS — Z86010 Personal history of colonic polyps: Secondary | ICD-10-CM

## 2010-10-05 DIAGNOSIS — Z1211 Encounter for screening for malignant neoplasm of colon: Secondary | ICD-10-CM

## 2010-10-05 MED ORDER — SODIUM CHLORIDE 0.9 % IV SOLN: 500.0000 mL | INTRAVENOUS | Status: DC

## 2010-10-05 NOTE — Patient Instructions (Signed)
Please read the handouts given to you by your recovery room nurse.   Your biopsy report will be mailed to you within 2 weeks.  You need to increase the fiber in your diet due to your diverticulosis.  You may resume your routine medications today.   IF you have any questions,  Please call us at [phone].  Thank-you.

## 2010-10-05 NOTE — Progress Notes (Signed)
Pt tolerated the procedure very well. maw

## 2010-10-06 ENCOUNTER — Telehealth: Payer: Self-pay

## 2010-10-06 NOTE — Telephone Encounter (Signed)
No ID on answering machine. 

## 2010-10-13 ENCOUNTER — Encounter: Payer: Self-pay | Admitting: Gastroenterology

## 2010-10-14 NOTE — Progress Notes (Signed)
Quick Note:  A copy has been sent out to pt. ______

## 2010-12-01 ENCOUNTER — Telehealth: Payer: Self-pay | Admitting: Family Medicine

## 2010-12-01 DIAGNOSIS — N400 Enlarged prostate without lower urinary tract symptoms: Secondary | ICD-10-CM

## 2010-12-01 NOTE — Telephone Encounter (Signed)
Pt would like to have PSA done tomorrow. Pt has appt with urologist at [facility] on 12-09-2010. Doc stafford will not be in office until 12-15-10.

## 2010-12-01 NOTE — Telephone Encounter (Signed)
Order a PSA for " BPH with obstruction "

## 2010-12-03 ENCOUNTER — Other Ambulatory Visit (INDEPENDENT_AMBULATORY_CARE_PROVIDER_SITE_OTHER): Payer: Medicare Other

## 2010-12-03 DIAGNOSIS — N401 Enlarged prostate with lower urinary tract symptoms: Secondary | ICD-10-CM

## 2010-12-03 DIAGNOSIS — N139 Obstructive and reflux uropathy, unspecified: Secondary | ICD-10-CM

## 2010-12-03 LAB — PSA: PSA: 25.91 ng/mL — ABNORMAL HIGH (ref 0.10–4.00)

## 2010-12-03 NOTE — Telephone Encounter (Signed)
Future order placed 

## 2010-12-03 NOTE — Telephone Encounter (Signed)
Pt is sch for psa today at 2pm

## 2010-12-06 ENCOUNTER — Emergency Department (HOSPITAL_COMMUNITY): Payer: Medicare Other

## 2010-12-06 ENCOUNTER — Observation Stay (HOSPITAL_COMMUNITY)
Admission: EM | Admit: 2010-12-06 | Discharge: 2010-12-08 | Disposition: A | Discharge status: Home / Self Care | Payer: Medicare Other | Attending: Internal Medicine | Admitting: Internal Medicine

## 2010-12-06 DIAGNOSIS — M25519 Pain in unspecified shoulder: Principal | ICD-10-CM | POA: Insufficient documentation

## 2010-12-06 DIAGNOSIS — Z8546 Personal history of malignant neoplasm of prostate: Secondary | ICD-10-CM | POA: Insufficient documentation

## 2010-12-06 DIAGNOSIS — I10 Essential (primary) hypertension: Secondary | ICD-10-CM | POA: Insufficient documentation

## 2010-12-06 DIAGNOSIS — I4891 Unspecified atrial fibrillation: Secondary | ICD-10-CM | POA: Insufficient documentation

## 2010-12-06 LAB — DIFFERENTIAL
Basophils Absolute: 0 10*3/uL (ref 0.0–0.1)
Lymphocytes Relative: 25 % (ref 12–46)
Lymphs Abs: 1.9 10*3/uL (ref 0.7–4.0)
Monocytes Absolute: 0.7 10*3/uL (ref 0.1–1.0)
Monocytes Relative: 9 % (ref 3–12)
Neutro Abs: 5 10*3/uL (ref 1.7–7.7)

## 2010-12-06 LAB — PROTIME-INR: INR: 1.09 (ref 0.00–1.49)

## 2010-12-06 LAB — CARDIAC PANEL(CRET KIN+CKTOT+MB+TROPI)
CK, MB: 3.4 ng/mL (ref 0.3–4.0)
Total CK: 79 U/L (ref 7–232)

## 2010-12-06 LAB — CBC
HCT: 48.6 % (ref 39.0–52.0)
Hemoglobin: 17.1 g/dL — ABNORMAL HIGH (ref 13.0–17.0)
MCHC: 35.2 g/dL (ref 30.0–36.0)
MCV: 88.4 fL (ref 78.0–100.0)

## 2010-12-06 LAB — BASIC METABOLIC PANEL
CO2: 26 mEq/L (ref 19–32)
Calcium: 9.6 mg/dL (ref 8.4–10.5)
GFR calc non Af Amer: 83 mL/min — ABNORMAL LOW (ref >90)
Sodium: 139 mEq/L (ref 135–145)

## 2010-12-06 LAB — POCT I-STAT TROPONIN I

## 2010-12-07 ENCOUNTER — Telehealth: Payer: Self-pay | Admitting: Family Medicine

## 2010-12-07 DIAGNOSIS — I359 Nonrheumatic aortic valve disorder, unspecified: Secondary | ICD-10-CM

## 2010-12-07 DIAGNOSIS — I4891 Unspecified atrial fibrillation: Secondary | ICD-10-CM

## 2010-12-07 LAB — BASIC METABOLIC PANEL
CO2: 23 mEq/L (ref 19–32)
Glucose, Bld: 108 mg/dL — ABNORMAL HIGH (ref 70–99)
Potassium: 3.9 mEq/L (ref 3.5–5.1)
Sodium: 137 mEq/L (ref 135–145)

## 2010-12-07 LAB — CARDIAC PANEL(CRET KIN+CKTOT+MB+TROPI)
CK, MB: 2.7 ng/mL (ref 0.3–4.0)
Relative Index: INVALID (ref 0.0–2.5)
Total CK: 70 U/L (ref 7–232)
Troponin I: <0.3 ng/mL (ref <0.30)

## 2010-12-07 LAB — APTT: aPTT: 31 seconds (ref 24–37)

## 2010-12-07 LAB — CBC
Platelets: 172 10*3/uL (ref 150–400)
RDW: 13.2 % (ref 11.5–15.5)
WBC: 7.6 10*3/uL (ref 4.0–10.5)

## 2010-12-07 LAB — PROTIME-INR: INR: 1.16 (ref 0.00–1.49)

## 2010-12-07 NOTE — Telephone Encounter (Signed)
Pt would like psa results.

## 2010-12-08 NOTE — Consult Note (Signed)
[name], [name] NO.:  [id number]  MEDICAL RECORD NO.:  [id number]  LOCATION:  1428                         FACILITY:  [facility]  PHYSICIAN:  Pricilla Riffle, MD, FACCDATE OF BIRTH:  07-Oct-1927  DATE OF CONSULTATION:  12/07/2010 DATE OF DISCHARGE:                                CONSULTATION   IDENTIFICATION:  The patient is an 77 year old gentleman, who we are asked to see regarding atrial fibrillation.  HISTORY:  The patient was admitted on December 06, 2010 with right clavicle pain, noted no chest pain, some dyspnea on exertion.  He was admitted and he was noted to be in atrial fibrillation with heart rates in the 80s-120s.  Echocardiogram was ordered and a thyroid panel ordered.  On talking to the patient, he denies palpitations, no dizziness, no chest pain on the left.  No passing out.  His right clavicle pain has improved since admission.  It is positional, worse with moving his shoulder.  Note:  He had a fall earlier this year, tangled with vacuum cleaner cord, usually does not fall.  No history of GI bleeding.  ALLERGIES:  None.  MEDICATIONS:  At home: 1. Lisinopril 10 here in the hospital. 2. Aspirin 325. 3. Lisinopril 10 mg. 4. Protonix 40.  PAST MEDICAL HISTORY: 1. Atrial fibrillation, first documented in June 2011 per his report. 2. Hypertension. 3. Prostate cancer being followed. 4. History of a murmur.  PAST SURGICAL HISTORY:  Hemorrhoid surgery.  SOCIAL HISTORY:  The patient is married, retired.  No tobacco and no ETOH.  PHYSICAL EXAMINATION:  GENERAL:  The patient is in no acute distress at rest. VITAL SIGNS:  Blood pressure is 121-138/76-87, pulse is in the 90s, temperature is 98, O2 sat on room air is 98%. HEENT:  Normocephalic, atraumatic.  EOMI, PERRL. NECK:  JVP is normal.  No bruits.  No thyromegaly. LUNGS:  Clear to auscultation.  No rales or wheezes. CARDIAC:  Irregularly irregular S1 and S2.  Grade 1-2/6 systolic  murmur heard best at the apex. ABDOMEN:  Supple, nontender.  No hepatomegaly.  Normal bowel sounds.  No masses.  EXTREMITIES:  No edema, 2+ pulses in the posterior tibial.  LABORATORY DATA:  Significant for hemoglobin of 15, WBC 7.6, platelets 172.  CK, troponin negative x3.  BUN and creatinine of 15 and 0.82. Potassium of 3.9.  PTT is 31, PT of 1.16.  TSH 2.577.  Chest x-ray shows cardiomegaly, hiatal hernia.  Telemetry, atrial fibrillation 90s-120s. Echocardiogram done today shows an LVEF of 55-60%, mild LVH, severe left atrial enlargement.  Mild mitral regurgitation.  Mild aortic insufficiency.  Twelve-lead EKG:  Atrial fibrillation, 92 beats per minute.  IMPRESSION: 1. Atrial fibrillation has been noted on EKGs for over a year.  He is     asymptomatic.  Right clavicle discomfort appears to be muscular.     Rates are not controlled.  I agree with discontinuation of     lisinopril as has been done.  I would change his metoprolol to 25     q.6 hours and follow heart rate, blood pressure, could then change     to b.i.d. dosing.  The patient should be  on Coumadin given his age and history of hypertension.  He is not at fall risk nor has he had a history of gastrointestinal bleeding.  He understands the increased risk of cerebrovascular accident, but would like to discuss this with Dr. Scotty Court before starting.  Dr. Scotty Court returns to the office on December 15, 2010, he will need an appointment at that time.  For now, would recommend 325 aspirin.  1. Hypertension.  Follow with change.     Pricilla Riffle, MD, [facility]     PVR/MEDQ  D:  12/07/2010  T:  12/08/2010  Job:  [id number]

## 2010-12-09 NOTE — Telephone Encounter (Signed)
Called and pt request labs be sent to his home address.  Pt is aware

## 2010-12-23 NOTE — Discharge Summary (Signed)
  NAMEAVROHOM, MCKELVIN NO.:  [id number]  MEDICAL RECORD NO.:  [id number]  LOCATION:  1428                         FACILITY:  [facility]  PHYSICIAN:  Hartley Barefoot, MD    DATE OF BIRTH:  [birth year]  DATE OF ADMISSION:  12/06/2010 DATE OF DISCHARGE:  12/08/2010                              DISCHARGE SUMMARY   DISCHARGE DIAGNOSIS:  Atrial fibrillation, rapid ventricular response.  The patient was admitted to the hospital on Telemetry.  He was started on metoprolol.  His lisinopril was stopped.  He will be discharged on 50 mg p.o. b.i.d. of metoprolol.  Cardiology was consulted.  He can follow with Dr. Tenny Craw or with Dr. Elease Hashimoto as an outpatient.  The patient was continued on aspirin 325 mg p.o. daily.  TSH was normal at 2.5.  Cardiac enzymes x3 negative.  The patient wants to discuss Coumadin with his primary care physician.  He had a 2D echo that showed normal ejection fraction of 60%.  Wall motion was normal.  No regional wall motion abnormalities.  Right clavicle pain, this is likely musculoskeletal.  This pain has resolved with ibuprofen.  Patient can follow with his primary care physician for further care.  He said that he had a prior history of right clavicle fracture that was not shown on the x-ray in this hospitalization.  The patient will need to follow up with his primary care physician for further evaluation.  His pain has resolved at this time.  Hypertension.  We will discontinue lisinopril now that he is going to be on metoprolol.  Blood pressure in the 130 range.  On the day of discharge, blood pressure 133/75, sat 96% on room air, respirations 20, pulse 84, temperature 97.9.  White blood cell 7.6, hemoglobin 15, platelet 172.  Sodium 137, potassium 3.9, chloride 105, bicarb 23, glucose 108, BUN 15, creatinine 0.82, calcium 9.0.  The patient was discharged in improved condition.  The patient was given prescription for Protonix in case that he need to  take more ibuprofen to avoid gastritis.     Hartley Barefoot, MD     BR/MEDQ  D:  12/08/2010  T:  12/09/2010  Job:  [id number]  Electronically Signed by Hartley Barefoot MD on 12/23/2010 04:10:43 PM

## 2010-12-23 NOTE — H&P (Signed)
NAMEOSMAN, CALZADILLA NO.:  [id number]  MEDICAL RECORD NO.:  [id number]  LOCATION:  1428                         FACILITY:  [facility]  PHYSICIAN:  Hartley Barefoot, MD    DATE OF BIRTH:  02/14/1928  DATE OF ADMISSION:  12/06/2010 DATE OF DISCHARGE:                             HISTORY & PHYSICAL   CHIEF COMPLAINT:  Right clavicle pain.  HISTORY OF PRESENT ILLNESS:  This is an 77 year old, history of prostate cancer, hypertension, prior history of AFib, who presents to the hospital complaining of right shoulder and right clavicle pain.  He was found to be on AFib with heart rate goes into the 126.  Patient related that night prior to admission, he was found to have pain on his right shoulder and clavicle.  The pain was getting worse over a course of the night.  He took an aspirin, but that did not help.  He said that he had a prior history of a right clavicle dislocation diagnosed by body scan in 2010.  He relates some shortness of breath on exertion. Denies any chest pain.  No shortness of breath.  He has been having some cough for the last 6 months.  Nothing significant.  ALLERGIES:  No known drug allergy.  REVIEW OF SYSTEMS:  Negative except as per HPI.  PAST MEDICAL HISTORY: 1. History of prostate cancer. 2. History of hypertension. 3. History of AFib. 4. History of heart murmur.  SOCIAL HISTORY:  He is retired.  He used to work with life [employer].  Denies alcohol, recreational drugs, or smoking.  He lives with wife in [place].  MEDICATIONS:  Lisinopril 10 mg p.o. daily.  PAST SURGICAL HISTORY: 1. Cholecystectomy. 2. Hemorrhoidectomy.  PHYSICAL EXAMINATION:  VITAL SIGNS:  Temperature 99.1, pulse 96 to 126, respirations 21, sat 98.4 on room air, blood pressure 137/94. GENERAL: Patient is well developed, in no acute distress. HEENT:  Head atraumatic, normocephalic. EYES:  Anicteric.  Pupils equal and reactive to light.   Extraocular muscles intact. NECK:  Supple.  No JVD.  No carotid bruits. CARDIOVASCULAR:  S1, S2.  Irregular rhythm and rate.  No rubs, murmurs, or gallops. LUNGS:  Bilateral good air movement.  No wheezing, crackles, or rhonchi. ABDOMEN:  Bowel sounds present, soft, nontender, and nondistended.  No rigidity.  No guarding. EXTREMITIES:  No edema. NEURO EXAM:  Alert and oriented x3.  Cranial nerves II-XII intact. Sensation grossly intact.  Motor strength 5/5, although some limitation on the right arm due to pain.  ASSESSMENT AND PLAN: 1. Atrial fibrillation.  Rate uncontrolled in the 120s, but to go     back, nonsustained, goes down to the 80s.  We will admit, cycle     cardiac enzymes, monitor on telemetry.  We will check a 2D echo,     check thyroid enzymes.  We will consider medication depending on     what his heart rate does.  The patient is not too eager to be on     multiple medications. 2. Right shoulder clavicle pain, probably arthritis.  Ibuprofen is the     only medication that he agreed to take.  Protonix to protect  stomach.3. Hypertension.  Continue with lisinopril. 4. Deep vein thrombosis prophylaxis,  sequential compression devices.     Hartley Barefoot, MD     BR/MEDQ  D:  12/06/2010  T:  12/07/2010  Job:  [id number]  Electronically Signed by Hartley Barefoot MD on 12/23/2010 04:12:39 PM

## 2010-12-29 ENCOUNTER — Other Ambulatory Visit: Payer: Self-pay | Admitting: Family Medicine

## 2011-01-04 ENCOUNTER — Telehealth: Payer: Self-pay | Admitting: Family Medicine

## 2011-01-04 MED ORDER — METOPROLOL TARTRATE 50 MG PO TABS: 50.0000 mg | ORAL_TABLET | Freq: Two times a day (BID) | ORAL | Status: DC

## 2011-01-04 NOTE — Telephone Encounter (Signed)
Call in metoprolol tartrate 50 mg bid , #60 with 2 rf

## 2011-01-04 NOTE — Telephone Encounter (Signed)
Script sent e-scribe 

## 2011-01-04 NOTE — Telephone Encounter (Signed)
Pt left a voice message and said to tell you that he is taking Metoprolol Tartrate.

## 2011-01-08 ENCOUNTER — Encounter: Payer: Self-pay | Admitting: Family Medicine

## 2011-01-08 ENCOUNTER — Ambulatory Visit (INDEPENDENT_AMBULATORY_CARE_PROVIDER_SITE_OTHER): Payer: Medicare Other | Admitting: Family Medicine

## 2011-01-08 VITALS — BP 122/80 | HR 89 | Temp 98.4°F | Wt 191.0 lb

## 2011-01-08 DIAGNOSIS — I4891 Unspecified atrial fibrillation: Secondary | ICD-10-CM

## 2011-01-08 DIAGNOSIS — I480 Paroxysmal atrial fibrillation: Secondary | ICD-10-CM

## 2011-01-08 DIAGNOSIS — C61 Malignant neoplasm of prostate: Secondary | ICD-10-CM

## 2011-01-08 NOTE — Progress Notes (Signed)
  Subjective:    Patient ID: [name], male    DOB: [birth year], 77 y.o.   MRN: [id number]  HPI77 yr old male to establish with me and to ask advice. He has a diagnosis of biopsy proven prostate cancer, but he has been told that this is very slow growing and will probably never bother him. He has been advised to do nothing about this,, and he feels this wa.himself. He asks ,y opinion. Also he has had several bouts of atrial fibrillation. He has been advised to take Coumadin, but he has resisted this due to his fear of side effects like bleeding. He does take an aspirin a day. He asks my advice.he feels fine in general.    Review of Systems  Constitutional: Negative.   Respiratory: Negative.   Cardiovascular: Negative.        Objective:   Physical Exam  Constitutional: He appears well-developed and well-nourished.  Neck: No thyromegaly present.  Cardiovascular: Normal rate, regular rhythm, normal heart sounds and intact distal pulses.   Pulmonary/Chest: Effort normal and breath sounds normal.  Lymphadenopathy:    He has no cervical adenopathy.          Assessment & Plan:  As for the prostate cancer, I totally agree with him to do nothing aggressive. Simply observe at this time. As for the atrial fibrillation, I advised him to definitely get on Coumadin because I think the odds of having a stroke far outweigh the odds of having a bleed. However he disagrees and he wants to stick with only an aspirin a day. We will monitor this closely.

## 2011-01-10 ENCOUNTER — Encounter: Payer: Self-pay | Admitting: Family Medicine

## 2011-02-14 ENCOUNTER — Emergency Department (HOSPITAL_COMMUNITY)
Admission: EM | Admit: 2011-02-14 | Discharge: 2011-02-14 | Disposition: A | Discharge status: Home / Self Care | Payer: Medicare Other | Attending: Emergency Medicine | Admitting: Emergency Medicine

## 2011-02-14 ENCOUNTER — Encounter (HOSPITAL_COMMUNITY): Payer: Self-pay

## 2011-02-14 ENCOUNTER — Other Ambulatory Visit: Payer: Self-pay

## 2011-02-14 ENCOUNTER — Emergency Department (HOSPITAL_COMMUNITY): Payer: Medicare Other

## 2011-02-14 DIAGNOSIS — R059 Cough, unspecified: Secondary | ICD-10-CM | POA: Insufficient documentation

## 2011-02-14 DIAGNOSIS — J3489 Other specified disorders of nose and nasal sinuses: Secondary | ICD-10-CM | POA: Insufficient documentation

## 2011-02-14 DIAGNOSIS — J111 Influenza due to unidentified influenza virus with other respiratory manifestations: Secondary | ICD-10-CM | POA: Insufficient documentation

## 2011-02-14 DIAGNOSIS — R5381 Other malaise: Secondary | ICD-10-CM | POA: Insufficient documentation

## 2011-02-14 DIAGNOSIS — R55 Syncope and collapse: Secondary | ICD-10-CM | POA: Insufficient documentation

## 2011-02-14 DIAGNOSIS — K589 Irritable bowel syndrome without diarrhea: Secondary | ICD-10-CM | POA: Insufficient documentation

## 2011-02-14 DIAGNOSIS — R05 Cough: Secondary | ICD-10-CM | POA: Insufficient documentation

## 2011-02-14 DIAGNOSIS — R5383 Other fatigue: Secondary | ICD-10-CM | POA: Insufficient documentation

## 2011-02-14 DIAGNOSIS — E860 Dehydration: Secondary | ICD-10-CM | POA: Insufficient documentation

## 2011-02-14 DIAGNOSIS — I10 Essential (primary) hypertension: Secondary | ICD-10-CM | POA: Insufficient documentation

## 2011-02-14 DIAGNOSIS — R509 Fever, unspecified: Secondary | ICD-10-CM | POA: Insufficient documentation

## 2011-02-14 LAB — URINALYSIS, ROUTINE W REFLEX MICROSCOPIC
Glucose, UA: NEGATIVE mg/dL
Leukocytes, UA: NEGATIVE
Specific Gravity, Urine: 1.031 — ABNORMAL HIGH (ref 1.005–1.030)
pH: 5.5 (ref 5.0–8.0)

## 2011-02-14 LAB — COMPREHENSIVE METABOLIC PANEL
Albumin: 3.5 g/dL (ref 3.5–5.2)
Alkaline Phosphatase: 72 U/L (ref 39–117)
BUN: 17 mg/dL (ref 6–23)
Calcium: 9.3 mg/dL (ref 8.4–10.5)
Potassium: 4.1 mEq/L (ref 3.5–5.1)
Total Protein: 7.3 g/dL (ref 6.0–8.3)

## 2011-02-14 LAB — DIFFERENTIAL
Basophils Relative: 0 % (ref 0–1)
Eosinophils Absolute: 0.1 10*3/uL (ref 0.0–0.7)
Monocytes Relative: 15 % — ABNORMAL HIGH (ref 3–12)
Neutrophils Relative %: 61 % (ref 43–77)

## 2011-02-14 LAB — CBC
Hemoglobin: 15.7 g/dL (ref 13.0–17.0)
MCH: 31.4 pg (ref 26.0–34.0)
MCHC: 35.2 g/dL (ref 30.0–36.0)
Platelets: 170 10*3/uL (ref 150–400)

## 2011-02-14 LAB — URINE MICROSCOPIC-ADD ON

## 2011-02-14 MED ORDER — OSELTAMIVIR PHOSPHATE 75 MG PO CAPS: 75.0000 mg | ORAL_CAPSULE | Freq: Two times a day (BID) | ORAL | Status: AC

## 2011-02-14 MED ORDER — SODIUM CHLORIDE 0.9 % IV BOLUS (SEPSIS)
500.0000 mL | Freq: Once | INTRAVENOUS | Status: AC
  Administered 2011-02-14: 500 mL via INTRAVENOUS

## 2011-02-14 MED ORDER — OSELTAMIVIR PHOSPHATE 75 MG PO CAPS
75.0000 mg | ORAL_CAPSULE | Freq: Two times a day (BID) | ORAL | Status: AC
  Administered 2011-02-14: 75 mg via ORAL
  Filled 2011-02-14: qty 1

## 2011-02-14 NOTE — ED Provider Notes (Signed)
Patient states he feels better after getting some IV fluids. He states he has been up and has felt fine.  Results for orders placed during the hospital encounter of 02/14/11  URINALYSIS, ROUTINE W REFLEX MICROSCOPIC      Component Value Range   Color, Urine AMBER (*) YELLOW    APPearance CLOUDY (*) CLEAR    Specific Gravity, Urine 1.031 (*) 1.005 - 1.030    pH 5.5  5.0 - 8.0    Glucose, UA NEGATIVE  NEGATIVE (mg/dL)   Hgb urine dipstick NEGATIVE  NEGATIVE    Bilirubin Urine NEGATIVE  NEGATIVE    Ketones, ur TRACE (*) NEGATIVE (mg/dL)   Protein, ur 30 (*) NEGATIVE (mg/dL)   Urobilinogen, UA 0.2  0.0 - 1.0 (mg/dL)   Nitrite NEGATIVE  NEGATIVE    Leukocytes, UA NEGATIVE  NEGATIVE   TROPONIN I      Component Value Range   Troponin I <0.30  <0.30 (ng/mL)  URINE MICROSCOPIC-ADD ON      Component Value Range   Squamous Epithelial / LPF RARE  RARE    WBC, UA 3-6  <3 (WBC/hpf)   RBC / HPF 3-6  <3 (RBC/hpf)   Bacteria, UA MANY (*) RARE    Casts HYALINE CASTS (*) NEGATIVE    Urine-Other MUCOUS PRESENT     Laboratory interpretation all normal except urine concentrated consistent with dehydration.  We'll check orthostatic blood pressures and see how patient feels when he ambulates in the hall and if he does well he can go home   1755 patient's blood pressure remained stable when he stands however his heart rate goes from 84 lying to 100 standing. Patient is not having any vomiting so he should be able to drink fluids at home. Patient is eager to be discharged. Dr.Belfi had discussed with me to send him home on Tamiflu.  Diagnoses that have been ruled out:  Diagnoses that are still under consideration:  Final diagnoses:  Influenza  Dehydration  Syncope   New Prescriptions   OSELTAMIVIR (TAMIFLU) 75 MG CAPSULE    Take 1 capsule (75 mg total) by mouth every 12 (twelve) hours.    Plan discharge  Devoria Albe, MD, Franz Dell, MD 02/14/11 [phone]

## 2011-02-14 NOTE — ED Provider Notes (Addendum)
History     CSN: [id number]  Arrival date & time 02/14/11  1248   First MD Initiated Contact with Patient 02/14/11 1343      Chief Complaint  Patient presents with  . Fatigue  . Nasal Congestion    (Consider location/radiation/quality/duration/timing/severity/associated sxs/prior treatment) HPI Comments: Pt with 2 days hx of nasal congestion/cough/fatigue.  Feels weak all over.  No n/v.  Has had decreased appetitie and some loose stools.  Fevers up to 101.  Was eating breakfast today and had brief syncopal episode.  No neuro deficits, slid down on to floor.  Did not hit his head.  No pain.  No CP/SOB.    Patient is a 77 y.o. male presenting with URI. The history is provided by the patient.  URI The primary symptoms include fever, fatigue and cough. Primary symptoms do not include headaches, abdominal pain, nausea, vomiting, arthralgias or rash. The current episode started 2 days ago. The problem has been gradually worsening.  Symptoms associated with the illness include congestion. The illness is not associated with chills or rhinorrhea.    Past Medical History  Diagnosis Date  . Personal history of colonic polyps 09/12/2000    tubular adenoma  . Hypertension   . Irritable bowel syndrome   . Prostate cancer 1991    sees Dr. Gala Lewandowsky at [place], observation only   . Paroxysmal atrial fibrillation     Past Surgical History  Procedure Date  . Cholecystectomy   . Hemorrhoid surgery   . Prostate biopsy     Family History  Problem Relation Age of Onset  . Diabetes    . Hypertension    . Lung cancer Brother   . Sudden death    . Heart disease    . Stroke    . Heart disease Son   . Stomach cancer Brother     History  Substance Use Topics  . Smoking status: Never Smoker   . Smokeless tobacco: Never Used  . Alcohol Use: No      Review of Systems  Constitutional: Positive for fever and fatigue. Negative for chills and diaphoresis.  HENT: Positive for congestion.  Negative for rhinorrhea and sneezing.   Eyes: Negative.   Respiratory: Positive for cough. Negative for chest tightness and shortness of breath.   Cardiovascular: Negative for chest pain and leg swelling.  Gastrointestinal: Negative for nausea, vomiting, abdominal pain, diarrhea and blood in stool.  Genitourinary: Negative for frequency, hematuria, flank pain and difficulty urinating.  Musculoskeletal: Negative for back pain and arthralgias.  Skin: Negative for rash.  Neurological: Positive for syncope and light-headedness. Negative for dizziness, speech difficulty, weakness, numbness and headaches.    Allergies  Review of patient's allergies indicates no known allergies.  Home Medications   Current Outpatient Rx  Name Route Sig Dispense Refill  . ASPIRIN 325 MG PO TABS Oral Take 325 mg by mouth daily.      [name] VITAMIN D 1000 UNITS PO CAPS Oral Take 2,000 Units by mouth daily.      [name] DICYCLOMINE HCL 20 MG PO TABS Oral Take 20 mg by mouth every 6 (six) hours as needed. 1 TAB  BEFORE BREAKFAST AND 1 30-40 MINUTES BEFORE LUNCH     . OMEGA-3 FATTY ACIDS 1000 MG PO CAPS Oral Take 2 g by mouth daily.      [name] METOPROLOL TARTRATE 50 MG PO TABS Oral Take 1 tablet (50 mg total) by mouth 2 (two) times daily. 60 tablet 2  .  NAPROXEN SODIUM 220 MG PO TABS Oral Take 220 mg by mouth 2 (two) times daily as needed. For pain     . VITAMIN C 500 MG PO TABS Oral Take 500 mg by mouth daily.     [name] VITAMIN E 400 UNITS PO CAPS Oral Take 400 Units by mouth daily.        BP 94/55  Pulse 83  Temp(Src) 98.2 F (36.8 C) (Oral)  Resp 18  SpO2 98%  Physical Exam  Constitutional: He is oriented to person, place, and time. He appears well-developed and well-nourished.  HENT:  Head: Normocephalic and atraumatic.  Eyes: Pupils are equal, round, and reactive to light.  Neck: Normal range of motion. Neck supple.  Cardiovascular: Normal rate and regular rhythm.   Murmur heard. Pulmonary/Chest: Effort normal and  breath sounds normal. No respiratory distress. He has no wheezes. He has no rales. He exhibits no tenderness.  Abdominal: Soft. Bowel sounds are normal. There is no tenderness. There is no rebound and no guarding.  Musculoskeletal: Normal range of motion. He exhibits no edema.  Lymphadenopathy:    He has no cervical adenopathy.  Neurological: He is alert and oriented to person, place, and time. He has normal strength. No cranial nerve deficit or sensory deficit. Coordination normal. GCS eye subscore is 4. GCS verbal subscore is 5. GCS motor subscore is 6.  Skin: Skin is warm and dry. No rash noted.  Psychiatric: He has a normal mood and affect.    ED Course  Procedures (including critical care time)  Results for orders placed during the hospital encounter of 02/14/11  CBC      Component Value Range   WBC 5.3  4.0 - 10.5 (K/uL)   RBC 5.00  4.22 - 5.81 (MIL/uL)   Hemoglobin 15.7  13.0 - 17.0 (g/dL)   HCT 16.1  09.6 - 04.5 (%)   MCV 89.2  78.0 - 100.0 (fL)   MCH 31.4  26.0 - 34.0 (pg)   MCHC 35.2  30.0 - 36.0 (g/dL)   RDW 40.9  81.1 - 91.4 (%)   Platelets 170  150 - 400 (K/uL)  DIFFERENTIAL      Component Value Range   Neutrophils Relative 61  43 - 77 (%)   Neutro Abs 3.2  1.7 - 7.7 (K/uL)   Lymphocytes Relative 23  12 - 46 (%)   Lymphs Abs 1.2  0.7 - 4.0 (K/uL)   Monocytes Relative 15 (*) 3 - 12 (%)   Monocytes Absolute 0.8  0.1 - 1.0 (K/uL)   Eosinophils Relative 1  0 - 5 (%)   Eosinophils Absolute 0.1  0.0 - 0.7 (K/uL)   Basophils Relative 0  0 - 1 (%)   Basophils Absolute 0.0  0.0 - 0.1 (K/uL)  COMPREHENSIVE METABOLIC PANEL      Component Value Range   Sodium 134 (*) 135 - 145 (mEq/L)   Potassium 4.1  3.5 - 5.1 (mEq/L)   Chloride 100  96 - 112 (mEq/L)   CO2 23  19 - 32 (mEq/L)   Glucose, Bld 113 (*) 70 - 99 (mg/dL)   BUN 17  6 - 23 (mg/dL)   Creatinine, Ser 7.82  0.50 - 1.35 (mg/dL)   Calcium 9.3  8.4 - 95.6 (mg/dL)   Total Protein 7.3  6.0 - 8.3 (g/dL)   Albumin 3.5   3.5 - 5.2 (g/dL)   AST 21  0 - 37 (U/L)   ALT 15  0 -  53 (U/L)   Alkaline Phosphatase 72  39 - 117 (U/L)   Total Bilirubin 0.4  0.3 - 1.2 (mg/dL)   GFR calc non Af Amer 60 (*) >90 (mL/min)   GFR calc Af Amer 70 (*) >90 (mL/min)   Dg Chest 2 View  02/14/2011  *RADIOLOGY REPORT*  Clinical Data: Congestion and shortness of breath.  CHEST - 2 VIEW  Comparison: PA and lateral chest 12/06/2010.  Findings: Large hiatal hernia is again seen.  Lungs are clear.  No pneumothorax or effusion.  Heart size is mildly enlarged. Old left clavicle fracture is noted.  IMPRESSION:  1.  Mild cardiomegaly without acute disease. 2.  Negative abdomen. 3.  Hiatal hernia.  Original Report Authenticated By: Bernadene Bell. Maricela Curet, M.D.   Results for orders placed during the hospital encounter of 02/14/11  CBC      Component Value Range   WBC 5.3  4.0 - 10.5 (K/uL)   RBC 5.00  4.22 - 5.81 (MIL/uL)   Hemoglobin 15.7  13.0 - 17.0 (g/dL)   HCT 16.1  09.6 - 04.5 (%)   MCV 89.2  78.0 - 100.0 (fL)   MCH 31.4  26.0 - 34.0 (pg)   MCHC 35.2  30.0 - 36.0 (g/dL)   RDW 40.9  81.1 - 91.4 (%)   Platelets 170  150 - 400 (K/uL)  DIFFERENTIAL      Component Value Range   Neutrophils Relative 61  43 - 77 (%)   Neutro Abs 3.2  1.7 - 7.7 (K/uL)   Lymphocytes Relative 23  12 - 46 (%)   Lymphs Abs 1.2  0.7 - 4.0 (K/uL)   Monocytes Relative 15 (*) 3 - 12 (%)   Monocytes Absolute 0.8  0.1 - 1.0 (K/uL)   Eosinophils Relative 1  0 - 5 (%)   Eosinophils Absolute 0.1  0.0 - 0.7 (K/uL)   Basophils Relative 0  0 - 1 (%)   Basophils Absolute 0.0  0.0 - 0.1 (K/uL)  COMPREHENSIVE METABOLIC PANEL      Component Value Range   Sodium 134 (*) 135 - 145 (mEq/L)   Potassium 4.1  3.5 - 5.1 (mEq/L)   Chloride 100  96 - 112 (mEq/L)   CO2 23  19 - 32 (mEq/L)   Glucose, Bld 113 (*) 70 - 99 (mg/dL)   BUN 17  6 - 23 (mg/dL)   Creatinine, Ser 7.82  0.50 - 1.35 (mg/dL)   Calcium 9.3  8.4 - 95.6 (mg/dL)   Total Protein 7.3  6.0 - 8.3 (g/dL)    Albumin 3.5  3.5 - 5.2 (g/dL)   AST 21  0 - 37 (U/L)   ALT 15  0 - 53 (U/L)   Alkaline Phosphatase 72  39 - 117 (U/L)   Total Bilirubin 0.4  0.3 - 1.2 (mg/dL)   GFR calc non Af Amer 60 (*) >90 (mL/min)   GFR calc Af Amer 70 (*) >90 (mL/min)   Dg Chest 2 View  02/14/2011  *RADIOLOGY REPORT*  Clinical Data: Congestion and shortness of breath.  CHEST - 2 VIEW  Comparison: PA and lateral chest 12/06/2010.  Findings: Large hiatal hernia is again seen.  Lungs are clear.  No pneumothorax or effusion.  Heart size is mildly enlarged. Old left clavicle fracture is noted.  IMPRESSION:  1.  Mild cardiomegaly without acute disease. 2.  Negative abdomen. 3.  Hiatal hernia.  Original Report Authenticated By: Bernadene Bell. Maricela Curet, M.D.     Date:  02/14/2011  Rate: 74  Rhythm: atrial fibrillation  QRS Axis: normal  Intervals: normal  ST/T Wave abnormalities: nonspecific ST changes  Conduction Disutrbances:none  Narrative Interpretation:   Old EKG Reviewed: unchanged     No diagnosis found.    MDM  Pt with flu-like symptoms/cough/fever.  Had very brief syncopal episode, likely related to illness and dehydration.  No CP/SOB/palpitations.  BP slightly low.  Will give IVFs, check labs/EKG  Waiting for troponin, pt getting IVFs now.  Feel that if pt is feeling better, labs okay, pt can go home.  Pt turned over to oncoming physician       Rolan Bucco, MD 02/14/11 1553  Rolan Bucco, MD 02/14/11 1555

## 2011-02-14 NOTE — ED Notes (Signed)
Pt denied dizziness during ambulation, reports feeling better after eating, drinking and IV fluid, will monitor.

## 2011-02-14 NOTE — ED Notes (Signed)
Pt complains of a low fever, congestion and weakness for two days. Wife says this am he was trying to eat his cereal and the spoon was in his mouth but the cereal was coming out, was able to drink his juice and coffee and took his medication. No Nausea at all but he states that he feels terrible

## 2011-02-26 ENCOUNTER — Encounter: Payer: Self-pay | Admitting: Family Medicine

## 2011-02-26 ENCOUNTER — Ambulatory Visit (INDEPENDENT_AMBULATORY_CARE_PROVIDER_SITE_OTHER): Payer: Medicare Other | Admitting: Family Medicine

## 2011-02-26 VITALS — BP 128/80 | HR 102 | Temp 98.9°F | Wt 186.0 lb

## 2011-02-26 DIAGNOSIS — J40 Bronchitis, not specified as acute or chronic: Secondary | ICD-10-CM

## 2011-02-26 MED ORDER — HYDROCODONE-HOMATROPINE 5-1.5 MG/5ML PO SYRP: 5.0000 mL | ORAL_SOLUTION | ORAL | Status: AC | PRN

## 2011-02-26 MED ORDER — AZITHROMYCIN 250 MG PO TABS: ORAL_TABLET | ORAL | Status: AC

## 2011-02-26 NOTE — Progress Notes (Signed)
  Subjective:    Patient ID: [name], male    DOB: Nov 07, 1927, 78 y.o.   MRN: [id number]  HPI Here for several weeks of HA, chest tightness, and a dry cough. He was seen in the ED on 02-14-11 for a viral illness and dehydration. His labs and CXR were clear. Given IV fluids and Tamiflu. He has been coughing ever since. No fevers. No NVD. Drinking fluids.    Review of Systems  Constitutional: Positive for fatigue. Negative for fever and chills.  HENT: Negative.   Eyes: Negative.   Respiratory: Positive for cough.        Objective:   Physical Exam  Constitutional: He appears well-developed and well-nourished.  HENT:  Right Ear: External ear normal.  Left Ear: External ear normal.  Nose: Nose normal.  Mouth/Throat: Oropharynx is clear and moist. No oropharyngeal exudate.  Eyes: Conjunctivae are normal.  Pulmonary/Chest: Effort normal and breath sounds normal. No respiratory distress. He has no wheezes. He has no rales. He exhibits no tenderness.  Lymphadenopathy:    He has no cervical adenopathy.          Assessment & Plan:  Recheck prn

## 2011-03-02 ENCOUNTER — Ambulatory Visit: Payer: Medicare Other | Admitting: Family Medicine

## 2011-04-06 ENCOUNTER — Other Ambulatory Visit: Payer: Self-pay | Admitting: Family Medicine

## 2011-05-30 ENCOUNTER — Other Ambulatory Visit: Payer: Self-pay | Admitting: Family Medicine

## 2011-06-28 ENCOUNTER — Other Ambulatory Visit: Payer: Self-pay | Admitting: Family Medicine

## 2011-07-08 ENCOUNTER — Telehealth: Payer: Self-pay | Admitting: Family Medicine

## 2011-07-08 DIAGNOSIS — D6859 Other primary thrombophilia: Secondary | ICD-10-CM

## 2011-07-08 DIAGNOSIS — Z0000 Encounter for general adult medical examination without abnormal findings: Secondary | ICD-10-CM

## 2011-07-08 NOTE — Telephone Encounter (Signed)
He has to have the labs drawn at the [facility] lab per clinic policy. I have no idea what he means by "anticoagulation labs"

## 2011-07-08 NOTE — Telephone Encounter (Signed)
Patient stated that he need to have an anticoagulation test done when he gets the labs done per last convo with Dr. Clent Ridges. Please advise.

## 2011-07-08 NOTE — Telephone Encounter (Signed)
Patient wants to have his cpx labs done at [facility] for July. Please advise

## 2011-07-09 NOTE — Telephone Encounter (Signed)
I spoke with pt and advised him to call lab to see if he needs a appointment.

## 2011-07-09 NOTE — Telephone Encounter (Signed)
Pt refused to come here for labs, he lives right across the [address] and has always gone there for labs. He is coming in for a CPE and needs the lab order put in computer. He requested to check his PSA, PT & INR level. Please call pt when the orders are in computer.

## 2011-07-09 NOTE — Telephone Encounter (Signed)
Orders were sent to [facility] lab

## 2011-07-09 NOTE — Telephone Encounter (Signed)
Addended by: Gershon Crane A on: 07/09/2011 04:31 PM   Modules accepted: Orders

## 2011-08-18 ENCOUNTER — Other Ambulatory Visit (INDEPENDENT_AMBULATORY_CARE_PROVIDER_SITE_OTHER): Payer: Medicare Other

## 2011-08-18 DIAGNOSIS — D6859 Other primary thrombophilia: Secondary | ICD-10-CM

## 2011-08-18 DIAGNOSIS — Z125 Encounter for screening for malignant neoplasm of prostate: Secondary | ICD-10-CM

## 2011-08-18 DIAGNOSIS — Z0000 Encounter for general adult medical examination without abnormal findings: Secondary | ICD-10-CM

## 2011-08-18 DIAGNOSIS — Z79899 Other long term (current) drug therapy: Secondary | ICD-10-CM

## 2011-08-18 LAB — HEPATIC FUNCTION PANEL
ALT: 15 U/L (ref 0–53)
AST: 17 U/L (ref 0–37)
Bilirubin, Direct: 0.2 mg/dL (ref 0.0–0.3)
Total Bilirubin: 1.1 mg/dL (ref 0.3–1.2)
Total Protein: 6.3 g/dL (ref 6.0–8.3)

## 2011-08-18 LAB — CBC WITH DIFFERENTIAL/PLATELET
Basophils Absolute: 0 10*3/uL (ref 0.0–0.1)
Eosinophils Absolute: 0.2 10*3/uL (ref 0.0–0.7)
HCT: 47.6 % (ref 39.0–52.0)
Lymphs Abs: 2.4 10*3/uL (ref 0.7–4.0)
MCHC: 33.3 g/dL (ref 30.0–36.0)
MCV: 94 fl (ref 78.0–100.0)
Monocytes Absolute: 0.6 10*3/uL (ref 0.1–1.0)
Platelets: 189 10*3/uL (ref 150.0–400.0)
RDW: 13.5 % (ref 11.5–14.6)

## 2011-08-18 LAB — LIPID PANEL
Total CHOL/HDL Ratio: 5
Triglycerides: 108 mg/dL (ref 0.0–149.0)

## 2011-08-18 LAB — BASIC METABOLIC PANEL
CO2: 28 mEq/L (ref 19–32)
Chloride: 107 mEq/L (ref 96–112)
Glucose, Bld: 90 mg/dL (ref 70–99)
Sodium: 141 mEq/L (ref 135–145)

## 2011-08-18 LAB — PROTIME-INR: Prothrombin Time: 12.4 s (ref 10.2–12.4)

## 2011-08-30 ENCOUNTER — Ambulatory Visit (INDEPENDENT_AMBULATORY_CARE_PROVIDER_SITE_OTHER): Payer: Medicare Other | Admitting: Family Medicine

## 2011-08-30 ENCOUNTER — Encounter: Payer: Self-pay | Admitting: Family Medicine

## 2011-08-30 VITALS — BP 116/62 | HR 80 | Temp 98.5°F | Ht 68.0 in | Wt 190.0 lb

## 2011-08-30 DIAGNOSIS — Z0000 Encounter for general adult medical examination without abnormal findings: Secondary | ICD-10-CM

## 2011-08-30 MED ORDER — MUPIROCIN CALCIUM 2 % EX CREA: TOPICAL_CREAM | Freq: Three times a day (TID) | CUTANEOUS | Status: AC

## 2011-08-30 NOTE — Progress Notes (Signed)
  Subjective:    Patient ID: [name], male    DOB: 03-06-1927, 78 y.o.   MRN: [id number]  HPI 78 yr old male for a cpx. He is doing well but has a few concerns. He has a scabbed lesion on the right temple that was frozen off a few years ago. This has grown back. Also he has intermittent sores in the anterior right nostril for the past few months. His BP is stable. He has been seeing Dr. Gala Lewandowsky once a year for prostate cancer, and he has been getting PSA levels twice a year. These levels are stable, and they are simply observing him now due to his age.    Review of Systems  Constitutional: Negative.   HENT: Negative.   Eyes: Negative.   Respiratory: Negative.   Cardiovascular: Negative.   Gastrointestinal: Negative.   Genitourinary: Negative.   Musculoskeletal: Negative.   Skin: Negative.   Neurological: Negative.   Hematological: Negative.   Psychiatric/Behavioral: Negative.        Objective:   Physical Exam  Constitutional: He is oriented to person, place, and time. He appears well-developed and well-nourished. No distress.  HENT:  Head: Normocephalic and atraumatic.  Right Ear: External ear normal.  Left Ear: External ear normal.  Nose: Nose normal.  Mouth/Throat: Oropharynx is clear and moist. No oropharyngeal exudate.  Eyes: Conjunctivae and EOM are normal. Pupils are equal, round, and reactive to light. Right eye exhibits no discharge. Left eye exhibits no discharge. No scleral icterus.  Neck: Neck supple. No JVD present. No tracheal deviation present. No thyromegaly present.  Cardiovascular: Normal rate, regular rhythm, normal heart sounds and intact distal pulses.  Exam reveals no gallop and no friction rub.   No murmur heard. Pulmonary/Chest: Effort normal and breath sounds normal. No respiratory distress. He has no wheezes. He has no rales. He exhibits no tenderness.  Abdominal: Soft. Bowel sounds are normal. He exhibits no distension and no mass. There is no  tenderness. There is no rebound and no guarding.  Genitourinary: Rectum normal, prostate normal and penis normal. Guaiac negative stool. No penile tenderness.  Musculoskeletal: Normal range of motion. He exhibits no edema and no tenderness.  Lymphadenopathy:    He has no cervical adenopathy.  Neurological: He is alert and oriented to person, place, and time. He has normal reflexes. No cranial nerve deficit. He exhibits normal muscle tone. Coordination normal.  Skin: Skin is warm and dry. No rash noted. He is not diaphoretic. No erythema. No pallor.       There is a scabbed crusty lesion on the right temple   Psychiatric: He has a normal mood and affect. His behavior is normal. Judgment and thought content normal.          Assessment & Plan:  Well exam. He will increase his OTC fish oil capsules to two a day. He will see Dr. Terri Piedra soon for the actinic keratosis on the temple. Use Bactroban in the nostrils bid for what is probably a colonizing staph infection.

## 2011-11-01 ENCOUNTER — Ambulatory Visit (INDEPENDENT_AMBULATORY_CARE_PROVIDER_SITE_OTHER): Payer: Medicare Other | Admitting: Family Medicine

## 2011-11-01 DIAGNOSIS — Z23 Encounter for immunization: Secondary | ICD-10-CM

## 2011-11-02 ENCOUNTER — Ambulatory Visit: Payer: Medicare Other | Admitting: Family Medicine

## 2011-11-24 ENCOUNTER — Other Ambulatory Visit: Payer: Self-pay | Admitting: Dermatology

## 2012-01-27 ENCOUNTER — Other Ambulatory Visit (INDEPENDENT_AMBULATORY_CARE_PROVIDER_SITE_OTHER): Payer: Medicare Other

## 2012-01-27 ENCOUNTER — Telehealth: Payer: Self-pay | Admitting: Family Medicine

## 2012-01-27 DIAGNOSIS — Z8546 Personal history of malignant neoplasm of prostate: Secondary | ICD-10-CM

## 2012-01-27 NOTE — Telephone Encounter (Signed)
He needs a PSA to take to Dr. Gala Lewandowsky when he goes

## 2012-01-28 ENCOUNTER — Telehealth: Payer: Self-pay | Admitting: Family Medicine

## 2012-01-28 NOTE — Progress Notes (Signed)
Quick Note:  I spoke with pt ______ 

## 2012-01-28 NOTE — Telephone Encounter (Signed)
Error

## 2012-02-23 HISTORY — PX: CATARACT EXTRACTION, BILATERAL: SHX1313

## 2012-03-03 ENCOUNTER — Encounter (HOSPITAL_COMMUNITY): Payer: Self-pay | Admitting: *Deleted

## 2012-03-03 ENCOUNTER — Emergency Department (HOSPITAL_COMMUNITY)
Admission: EM | Admit: 2012-03-03 | Discharge: 2012-03-03 | Disposition: A | Discharge status: Home / Self Care | Payer: Medicare Other | Attending: Emergency Medicine | Admitting: Emergency Medicine

## 2012-03-03 ENCOUNTER — Emergency Department (HOSPITAL_COMMUNITY): Payer: Medicare Other

## 2012-03-03 DIAGNOSIS — I499 Cardiac arrhythmia, unspecified: Secondary | ICD-10-CM | POA: Insufficient documentation

## 2012-03-03 DIAGNOSIS — C61 Malignant neoplasm of prostate: Secondary | ICD-10-CM | POA: Insufficient documentation

## 2012-03-03 DIAGNOSIS — I4891 Unspecified atrial fibrillation: Secondary | ICD-10-CM | POA: Insufficient documentation

## 2012-03-03 DIAGNOSIS — R5381 Other malaise: Secondary | ICD-10-CM | POA: Insufficient documentation

## 2012-03-03 DIAGNOSIS — R05 Cough: Secondary | ICD-10-CM | POA: Insufficient documentation

## 2012-03-03 DIAGNOSIS — K589 Irritable bowel syndrome without diarrhea: Secondary | ICD-10-CM | POA: Insufficient documentation

## 2012-03-03 DIAGNOSIS — IMO0001 Reserved for inherently not codable concepts without codable children: Secondary | ICD-10-CM | POA: Insufficient documentation

## 2012-03-03 DIAGNOSIS — Z79899 Other long term (current) drug therapy: Secondary | ICD-10-CM | POA: Insufficient documentation

## 2012-03-03 DIAGNOSIS — B9789 Other viral agents as the cause of diseases classified elsewhere: Secondary | ICD-10-CM | POA: Insufficient documentation

## 2012-03-03 DIAGNOSIS — Z860100 Personal history of colon polyps, unspecified: Secondary | ICD-10-CM | POA: Insufficient documentation

## 2012-03-03 DIAGNOSIS — I10 Essential (primary) hypertension: Secondary | ICD-10-CM | POA: Insufficient documentation

## 2012-03-03 DIAGNOSIS — R5383 Other fatigue: Secondary | ICD-10-CM | POA: Insufficient documentation

## 2012-03-03 DIAGNOSIS — R059 Cough, unspecified: Secondary | ICD-10-CM | POA: Insufficient documentation

## 2012-03-03 DIAGNOSIS — J111 Influenza due to unidentified influenza virus with other respiratory manifestations: Secondary | ICD-10-CM

## 2012-03-03 DIAGNOSIS — Z7982 Long term (current) use of aspirin: Secondary | ICD-10-CM | POA: Insufficient documentation

## 2012-03-03 MED ORDER — ACETAMINOPHEN 325 MG PO TABS
650.0000 mg | ORAL_TABLET | Freq: Once | ORAL | Status: AC
  Administered 2012-03-03: 650 mg via ORAL
  Filled 2012-03-03: qty 2

## 2012-03-03 NOTE — ED Notes (Signed)
Hx controlled a fib. Unable to obtain accurate HR on pulse ox. Pt placed on monitor and EKG performed.

## 2012-03-03 NOTE — ED Provider Notes (Signed)
History     CSN: [id number]  Arrival date & time 03/03/12  1534   First MD Initiated Contact with Patient 03/03/12 1840      Chief Complaint  Patient presents with  . Flu Like Symptoms     (Consider location/radiation/quality/duration/timing/severity/associated sxs/prior treatment) HPI This 79 year old male has a one-day history of fever cough chills body aches and some mild generalized weakness, he is taking fluids well, he is no vomiting or diarrhea, is no rash, is no confusion headache or stiff neck. He is not short of breath he has no abdominal pain. There is no treatment prior to arrival. He does not want to take Tamiflu. He does not take Coumadin due to this the risk of bleeding. He has chronic atrial fibrillation which is rate controlled on his blood pressure medicine. Past Medical History  Diagnosis Date  . Personal history of colonic polyps     tubular adenoma  . Hypertension   . Irritable bowel syndrome   . Prostate cancer 1991    sees Dr. Gala Lewandowsky at [place], observation only   . Paroxysmal atrial fibrillation     Past Surgical History  Procedure Date  . Hemorrhoid surgery   . Prostate biopsy   . Colonoscopy 2011    per Dr. Jarold Motto, benign polyps, no repeats needed     Family History  Problem Relation Age of Onset  . Diabetes    . Hypertension    . Lung cancer Brother   . Sudden death    . Heart disease    . Stroke    . Heart disease Son   . Stomach cancer Brother     History  Substance Use Topics  . Smoking status: Never Smoker   . Smokeless tobacco: Never Used  . Alcohol Use: No      Review of Systems 10 Systems reviewed and are negative for acute change except as noted in the HPI. Allergies  Review of patient's allergies indicates no known allergies.  Home Medications   Current Outpatient Rx  Name  Route  Sig  Dispense  Refill  . ASPIRIN 325 MG PO TABS   Oral   Take 325 mg by mouth daily.          [name] VITAMIN D 1000 UNITS PO CAPS  Oral   Take 2,000 Units by mouth every morning.          [name] OMEGA-3 FATTY ACIDS 1000 MG PO CAPS   Oral   Take 1 g by mouth every morning.          [name] METOPROLOL TARTRATE 50 MG PO TABS   Oral   Take 50 mg by mouth 2 (two) times daily.         [name] VITAMIN C 500 MG PO TABS   Oral   Take 500 mg by mouth every morning.          [name] VITAMIN E 400 UNITS PO CAPS   Oral   Take 400 Units by mouth every morning.            BP 140/78  Pulse 90  Temp 100.5 F (38.1 C) (Oral)  Resp 20  SpO2 100%  Physical Exam  Nursing note and vitals reviewed. Constitutional:       Awake, alert, nontoxic appearance.  HENT:  Head: Atraumatic.  Eyes: Right eye exhibits no discharge. Left eye exhibits no discharge.  Neck: Neck supple.  Cardiovascular: Normal rate.   No murmur heard.  Irregularly irregular rate and rhythm  Pulmonary/Chest: Effort normal and breath sounds normal. No respiratory distress. He has no wheezes. He has no rales. He exhibits no tenderness.  Abdominal: Soft. Bowel sounds are normal. He exhibits no distension and no mass. There is no tenderness. There is no rebound and no guarding.  Musculoskeletal: He exhibits no edema and no tenderness.       Baseline ROM, no obvious new focal weakness.  Neurological: He is alert.       Mental status and motor strength appears baseline for patient and situation.  Skin: No rash noted.  Psychiatric: He has a normal mood and affect.    ED Course  Procedures (including critical care time) ECG: History fibrillation, ventricular rate 92, normal axis, nonspecific diffuse T wave changes, no comparison ECG immediately available Labs Reviewed - No data to display Dg Chest 2 View  03/03/2012  *RADIOLOGY REPORT*  Clinical Data: Cough and fever.  CHEST - 2 VIEW  Comparison: Chest x-ray 02/14/2011.  Findings: Lung volumes are normal.  No acute consolidative air space disease.  No pleural effusions.  Mild interstitial prominence which appears  in part to correspond to some mild diffuse peribronchial cuffing.  No evidence of pulmonary edema.  Heart size is borderline enlarged.  Large hiatal hernia.  Mediastinal contours are otherwise unremarkable.  Atherosclerosis in the thoracic aorta.  IMPRESSION: 1.  Mild diffuse interstitial prominence and peribronchial cuffing, concerning for acute bronchitis. 2.  Large hiatal hernia. 3.  Atherosclerosis.   Original Report Authenticated By: Trudie Reed, M.D.      1. Influenza       MDM  Pt stable in ED with no significant deterioration in condition.Patient / Family / Caregiver informed of clinical course, understand medical decision-making process, and agree with plan.  I doubt any other EMC precluding discharge at this time including, but not necessarily limited to the following: Sepsis.  The patient does not want Tamiflu.         Hurman Horn, MD 03/04/12 [phone]

## 2012-03-03 NOTE — ED Notes (Signed)
MD at bedside to assess patient.

## 2012-03-03 NOTE — ED Notes (Signed)
Pt reports flu like symptoms of generalized body aches and fever with "throat getting raspy" since this am. Fever up to 103 at home. Has not taken any meds for symptoms.

## 2012-05-24 ENCOUNTER — Emergency Department (HOSPITAL_COMMUNITY)
Admission: EM | Admit: 2012-05-24 | Discharge: 2012-05-24 | Disposition: A | Discharge status: Home / Self Care | Payer: Medicare Other | Attending: Emergency Medicine | Admitting: Emergency Medicine

## 2012-05-24 ENCOUNTER — Encounter (HOSPITAL_COMMUNITY): Payer: Self-pay | Admitting: *Deleted

## 2012-05-24 ENCOUNTER — Emergency Department (HOSPITAL_COMMUNITY): Payer: Medicare Other

## 2012-05-24 DIAGNOSIS — W19XXXA Unspecified fall, initial encounter: Secondary | ICD-10-CM

## 2012-05-24 DIAGNOSIS — W1809XA Striking against other object with subsequent fall, initial encounter: Secondary | ICD-10-CM | POA: Insufficient documentation

## 2012-05-24 DIAGNOSIS — S0003XA Contusion of scalp, initial encounter: Secondary | ICD-10-CM | POA: Insufficient documentation

## 2012-05-24 DIAGNOSIS — Z860100 Personal history of colon polyps, unspecified: Secondary | ICD-10-CM | POA: Insufficient documentation

## 2012-05-24 DIAGNOSIS — Z79899 Other long term (current) drug therapy: Secondary | ICD-10-CM | POA: Insufficient documentation

## 2012-05-24 DIAGNOSIS — Z8679 Personal history of other diseases of the circulatory system: Secondary | ICD-10-CM | POA: Insufficient documentation

## 2012-05-24 DIAGNOSIS — Y9301 Activity, walking, marching and hiking: Secondary | ICD-10-CM | POA: Insufficient documentation

## 2012-05-24 DIAGNOSIS — Z8546 Personal history of malignant neoplasm of prostate: Secondary | ICD-10-CM | POA: Insufficient documentation

## 2012-05-24 DIAGNOSIS — S0180XA Unspecified open wound of other part of head, initial encounter: Secondary | ICD-10-CM | POA: Insufficient documentation

## 2012-05-24 DIAGNOSIS — S0083XA Contusion of other part of head, initial encounter: Secondary | ICD-10-CM

## 2012-05-24 DIAGNOSIS — S0181XA Laceration without foreign body of other part of head, initial encounter: Secondary | ICD-10-CM

## 2012-05-24 DIAGNOSIS — Y9289 Other specified places as the place of occurrence of the external cause: Secondary | ICD-10-CM | POA: Insufficient documentation

## 2012-05-24 DIAGNOSIS — W010XXA Fall on same level from slipping, tripping and stumbling without subsequent striking against object, initial encounter: Secondary | ICD-10-CM | POA: Insufficient documentation

## 2012-05-24 DIAGNOSIS — I10 Essential (primary) hypertension: Secondary | ICD-10-CM | POA: Insufficient documentation

## 2012-05-24 DIAGNOSIS — Z8719 Personal history of other diseases of the digestive system: Secondary | ICD-10-CM | POA: Insufficient documentation

## 2012-05-24 DIAGNOSIS — Z7982 Long term (current) use of aspirin: Secondary | ICD-10-CM | POA: Insufficient documentation

## 2012-05-24 LAB — CBC
Hemoglobin: 15.4 g/dL (ref 13.0–17.0)
MCV: 88.5 fL (ref 78.0–100.0)
Platelets: 172 10*3/uL (ref 150–400)
RBC: 4.95 MIL/uL (ref 4.22–5.81)
WBC: 6 10*3/uL (ref 4.0–10.5)

## 2012-05-24 LAB — POCT I-STAT, CHEM 8
BUN: 19 mg/dL (ref 6–23)
Chloride: 109 mEq/L (ref 96–112)
Creatinine, Ser: 0.9 mg/dL (ref 0.50–1.35)
Sodium: 141 mEq/L (ref 135–145)

## 2012-05-24 NOTE — ED Provider Notes (Signed)
History     CSN: [id number]  Arrival date & time 05/24/12  1623   First MD Initiated Contact with Patient 05/24/12 1657      Chief Complaint  Patient presents with  . Fall  . Facial Laceration    (Consider location/radiation/quality/duration/timing/severity/associated sxs/prior treatment) HPI Comments: [name] is a 79 y.o. Male who was walking in a parking deck, when he mis-judged, a step, off a curb. He stumbled forward, striking his head on the pavement. He did not lose consciousness. He presents complaining of pain only in his forehead and right cheek. He was treated seen by EMS, but decided to seek care, on his own. He made several stops, then went home, and then drove to the emergency department to be evaluated. He denies headache, neck pain, back pain, chest pain, abdominal pain, weakness, or dizziness. He's been using his medications, as directed. The pain in his forehead is worse with touch. There are no palliative factors.  Patient is a 79 y.o. male presenting with fall. The history is provided by the patient.  Fall    Past Medical History  Diagnosis Date  . Personal history of colonic polyps     tubular adenoma  . Hypertension   . Irritable bowel syndrome   . Prostate cancer 1991    sees Dr. Gala Lewandowsky at [place], observation only   . Paroxysmal atrial fibrillation     Past Surgical History  Procedure Laterality Date  . Hemorrhoid surgery    . Prostate biopsy    . Colonoscopy  2011    per Dr. Jarold Motto, benign polyps, no repeats needed     Family History  Problem Relation Age of Onset  . Diabetes    . Hypertension    . Lung cancer Brother   . Sudden death    . Heart disease    . Stroke    . Heart disease Son   . Stomach cancer Brother     History  Substance Use Topics  . Smoking status: Never Smoker   . Smokeless tobacco: Never Used  . Alcohol Use: No      Review of Systems  All other systems reviewed and are negative.    Allergies   Review of patient's allergies indicates no known allergies.  Home Medications   Current Outpatient Rx  Name  Route  Sig  Dispense  Refill  . aspirin 325 MG tablet   Oral   Take 325 mg by mouth daily.          . Cholecalciferol (VITAMIN D) 1000 UNITS capsule   Oral   Take 2,000 Units by mouth every morning.          . fish oil-omega-3 fatty acids 1000 MG capsule   Oral   Take 1 g by mouth every morning.          . metoprolol (LOPRESSOR) 50 MG tablet   Oral   Take 50 mg by mouth 2 (two) times daily.         . vitamin C (ASCORBIC ACID) 500 MG tablet   Oral   Take 500 mg by mouth every morning.            BP 144/91  Pulse 77  Temp(Src) 98.6 F (37 C) (Oral)  Resp 20  SpO2 98%  Physical Exam  Nursing note and vitals reviewed. Constitutional: He is oriented to person, place, and time. He appears well-developed and well-nourished.  HENT:  Head: Normocephalic.  Right Ear:  External ear normal.  Left Ear: External ear normal.  Contusion and abrasion, right cheek. No crepitation of the zygoma. Small contusion and laceration present on the right lateral eyebrow. The laceration is gaping and bleeding, somewhat. There is no associated crepitation around the orbit.  Eyes: Conjunctivae and EOM are normal. Pupils are equal, round, and reactive to light.  Neck: Normal range of motion and phonation normal. Neck supple.  Cardiovascular: Normal rate, regular rhythm, normal heart sounds and intact distal pulses.   Pulmonary/Chest: Effort normal and breath sounds normal. He exhibits no bony tenderness.  Abdominal: Soft. Normal appearance. There is no tenderness.  Musculoskeletal: Normal range of motion.  No tenderness at the midline posterior cervical spine  Neurological: He is alert and oriented to person, place, and time. He has normal strength. No cranial nerve deficit or sensory deficit. He exhibits normal muscle tone. Coordination normal.  Skin: Skin is warm, dry and  intact.  Psychiatric: He has a normal mood and affect. His behavior is normal. Judgment and thought content normal.    ED Course  Procedures (including critical care time)   LACERATION REPAIR Performed by: Flint Melter Consent: Verbal consent obtained. Risks and benefits: risks, benefits and alternatives were discussed Patient identity confirmed: provided demographic data Time out performed prior to procedure Prepped and Draped in normal sterile fashion Wound explored Laceration Location: right lateral eyebrow Laceration Length: 2.0cm No Foreign Bodies seen or palpated Anesthesia: local infiltration  Amount of cleaning: standard Skin closure: Dermabond Number of sutures or staples: NA Technique: Tissue Adhesive Patient tolerance: Patient tolerated the procedure well with no immediate complications.   Labs Reviewed  CBC  POCT I-STAT, CHEM 8   Ct Head Wo Contrast  05/24/2012  *RADIOLOGY REPORT*  Clinical Data: Larey Seat.  Hit head.  CT HEAD WITHOUT CONTRAST  Technique:  Contiguous axial images were obtained from the base of the skull through the vertex without contrast.  Comparison: None.  Findings: The ventricles are normal for age.  No extra-axial fluid collections are seen.  The brainstem and cerebellum are unremarkable. Moderate periventricular white matter disease.  No acute intracranial findings such as infarction or hemorrhage.  No mass lesions. Dense vascular calcifications.  The bony calvarium is intact.  The visualized paranasal sinuses and mastoid air cells are clear.  IMPRESSION: No acute intracranial findings or skull fracture.   Original Report Authenticated By: Rudie Meyer, M.D.    Nursing Notes Reviewed/ Care Coordinated, and agree without changes. Applicable Imaging Reviewed Interpretation of Laboratory Data incorporated into ED treatment  1. Fall, initial encounter   2. Contusion of face, initial encounter   3. Laceration of face, initial encounter        MDM  Evaluation is consistent with mechanical fall and isolated facial injuries. Doubt intracranial abnormality.Doubt metabolic instability, serious bacterial infection or impending vascular collapse; the patient is stable for discharge.      Plan: Home Medications- usual; Home Treatments- wound care; Recommended follow up- PCP prn     Flint Melter, MD 05/25/12 [phone]

## 2012-05-24 NOTE — ED Notes (Signed)
Pt to CT

## 2012-05-24 NOTE — ED Notes (Signed)
md at bedside

## 2012-05-24 NOTE — ED Notes (Signed)
Pt was walking in parking deck, misjudged step distance and fell forwards. Minor abrasion to both knees, minor abrasion to both hands, two quarter size lacerations to right side of face. Bleeding controlled. Pt reports hx of a fib, takes aspirin. Pt reports face is starting to ache 2/10.

## 2012-05-25 ENCOUNTER — Encounter (HOSPITAL_COMMUNITY): Payer: Self-pay | Admitting: Emergency Medicine

## 2012-05-25 ENCOUNTER — Emergency Department (HOSPITAL_COMMUNITY): Payer: Medicare Other

## 2012-05-25 ENCOUNTER — Emergency Department (HOSPITAL_COMMUNITY)
Admission: EM | Admit: 2012-05-25 | Discharge: 2012-05-25 | Disposition: A | Discharge status: Home / Self Care | Payer: Medicare Other | Attending: Emergency Medicine | Admitting: Emergency Medicine

## 2012-05-25 DIAGNOSIS — W010XXA Fall on same level from slipping, tripping and stumbling without subsequent striking against object, initial encounter: Secondary | ICD-10-CM | POA: Insufficient documentation

## 2012-05-25 DIAGNOSIS — Y9301 Activity, walking, marching and hiking: Secondary | ICD-10-CM | POA: Insufficient documentation

## 2012-05-25 DIAGNOSIS — IMO0002 Reserved for concepts with insufficient information to code with codable children: Secondary | ICD-10-CM | POA: Insufficient documentation

## 2012-05-25 DIAGNOSIS — I10 Essential (primary) hypertension: Secondary | ICD-10-CM | POA: Insufficient documentation

## 2012-05-25 DIAGNOSIS — W1809XA Striking against other object with subsequent fall, initial encounter: Secondary | ICD-10-CM | POA: Insufficient documentation

## 2012-05-25 DIAGNOSIS — Z8719 Personal history of other diseases of the digestive system: Secondary | ICD-10-CM | POA: Insufficient documentation

## 2012-05-25 DIAGNOSIS — I4891 Unspecified atrial fibrillation: Secondary | ICD-10-CM | POA: Insufficient documentation

## 2012-05-25 DIAGNOSIS — S0003XA Contusion of scalp, initial encounter: Secondary | ICD-10-CM | POA: Insufficient documentation

## 2012-05-25 DIAGNOSIS — Z7982 Long term (current) use of aspirin: Secondary | ICD-10-CM | POA: Insufficient documentation

## 2012-05-25 DIAGNOSIS — S63509A Unspecified sprain of unspecified wrist, initial encounter: Secondary | ICD-10-CM | POA: Insufficient documentation

## 2012-05-25 DIAGNOSIS — Z860100 Personal history of colon polyps, unspecified: Secondary | ICD-10-CM | POA: Insufficient documentation

## 2012-05-25 DIAGNOSIS — Z8546 Personal history of malignant neoplasm of prostate: Secondary | ICD-10-CM | POA: Insufficient documentation

## 2012-05-25 DIAGNOSIS — S060X9A Concussion with loss of consciousness of unspecified duration, initial encounter: Secondary | ICD-10-CM

## 2012-05-25 DIAGNOSIS — R110 Nausea: Secondary | ICD-10-CM | POA: Insufficient documentation

## 2012-05-25 DIAGNOSIS — Y9289 Other specified places as the place of occurrence of the external cause: Secondary | ICD-10-CM | POA: Insufficient documentation

## 2012-05-25 DIAGNOSIS — Z79899 Other long term (current) drug therapy: Secondary | ICD-10-CM | POA: Insufficient documentation

## 2012-05-25 LAB — POCT I-STAT, CHEM 8
BUN: 19 mg/dL (ref 6–23)
Creatinine, Ser: 1 mg/dL (ref 0.50–1.35)
Glucose, Bld: 151 mg/dL — ABNORMAL HIGH (ref 70–99)
Potassium: 4 mEq/L (ref 3.5–5.1)
Sodium: 141 mEq/L (ref 135–145)

## 2012-05-25 NOTE — ED Notes (Signed)
Per EMS--pt was seen yesterday for a fall. CT was negative and this am pt was altered on their arrival with complaints of right wrist pain. States that the pt was unable to stand however on route pt became more oriented.

## 2012-05-25 NOTE — ED Notes (Signed)
[id number]> Expected date:<BR> Expected time:<BR> Means of arrival:<BR> Comments:<BR> Hold for triage

## 2012-05-25 NOTE — ED Notes (Signed)
Pt is able to tell me with detail every event of yesterday including fall. States that he began to feel lightheaded this am after taken his grandson to court. States that he was cold and sweating and states that he was never really dizzy but was lightheaded. States that he ate breakfast this am and his first episode of lightheadedness at 0900, states that he had another episode of lightheadedness after using the restroom. States that he took ibuprofen and his regular am medications.

## 2012-05-25 NOTE — ED Notes (Signed)
Patient transported to X-ray 

## 2012-05-25 NOTE — ED Notes (Signed)
Returned from rad all scans complete.

## 2012-05-25 NOTE — ED Provider Notes (Addendum)
History    CSN: [id number] Arrival date & time 05/25/12  1000 First MD Initiated Contact with Patient 05/25/12 1014     Chief Complaint  Patient presents with  . Dizziness  . Wrist Pain   HPI Patient presents to the emergency room for evaluation of right wrist pain as well and is dizziness this morning. Patient fell yesterday when walking in a parking deck. He misjudged a step and tripped off a curb. Patient stumbled forward striking his head and face on the pavement. He did not lose consciousness at that time but did come to the emergency room for evaluation. Patient had a CT scan of the head that was normal. The patient was treated and released. Patient states he had been able to drive yesterday without any difficulty. He woke up this morning however he felt slightly dizzy. His wife was also concerned that he might of been somewhat confused in that she was not answering her questions and EMS reported that he did not answer all their questions.. Patient denies feeling confused. He states he is not sure he heard those people and they were trying to ask him questions. He did have a little bit of nausea earlier but denies any vomiting. She denies any trouble with his coordination.  Patient also noted this morning that he is having a lot of pain in his right wrist. He did not really notice that yesterday when he was evaluated. Patient denies any other complaints this morning Past Medical History  Diagnosis Date  . Personal history of colonic polyps     tubular adenoma  . Hypertension   . Irritable bowel syndrome   . Prostate cancer 1991    sees Dr. Gala Lewandowsky at [place], observation only   . Paroxysmal atrial fibrillation     Past Surgical History  Procedure Laterality Date  . Hemorrhoid surgery    . Prostate biopsy    . Colonoscopy  2011    per Dr. Jarold Motto, benign polyps, no repeats needed     Family History  Problem Relation Age of Onset  . Diabetes    . Hypertension    . Lung cancer  Brother   . Sudden death    . Heart disease    . Stroke    . Heart disease Son   . Stomach cancer Brother     History  Substance Use Topics  . Smoking status: Never Smoker   . Smokeless tobacco: Never Used  . Alcohol Use: No      Review of Systems  All other systems reviewed and are negative.    Allergies  Review of patient's allergies indicates no known allergies.  Home Medications   Current Outpatient Rx  Name  Route  Sig  Dispense  Refill  . aspirin 325 MG tablet   Oral   Take 325 mg by mouth daily.          . Cholecalciferol (VITAMIN D) 1000 UNITS capsule   Oral   Take 2,000 Units by mouth every morning.          . fish oil-omega-3 fatty acids 1000 MG capsule   Oral   Take 1 g by mouth every morning.          [name] ibuprofen (ADVIL,MOTRIN) 200 MG tablet   Oral   Take 200 mg by mouth every 6 (six) hours as needed for pain (pain).         . metoprolol (LOPRESSOR) 50 MG tablet   Oral  Take 50 mg by mouth 2 (two) times daily.         . vitamin C (ASCORBIC ACID) 500 MG tablet   Oral   Take 500 mg by mouth every morning.            BP 130/88  Pulse 89  Temp(Src) 97.6 F (36.4 C) (Oral)  Resp 16  SpO2 98%  Physical Exam  Nursing note and vitals reviewed. Constitutional: He appears well-developed and well-nourished. No distress.  HENT:  Head: Normocephalic.  Right Ear: External ear normal.  Left Ear: External ear normal.  Abrasion and contusion right forehead and face, periorbital edema and ecchymosis on the right side  Eyes: Conjunctivae are normal. Right eye exhibits no discharge. Left eye exhibits no discharge. No scleral icterus.  Neck: Neck supple. No tracheal deviation present.  Cardiovascular: Normal rate, regular rhythm and intact distal pulses.   Pulmonary/Chest: Effort normal and breath sounds normal. No stridor. No respiratory distress. He has no wheezes. He has no rales.  Abdominal: Soft. Bowel sounds are normal. He exhibits  no distension. There is no tenderness. There is no rebound and no guarding.  Musculoskeletal: He exhibits tenderness. He exhibits no edema.       Right wrist: He exhibits decreased range of motion, tenderness and bony tenderness. He exhibits no swelling and no effusion.       Cervical back: Normal. He exhibits no tenderness and no bony tenderness.  Neurological: He is alert. He has normal strength. No sensory deficit. Cranial nerve deficit:  no gross defecits noted. He exhibits normal muscle tone. He displays no seizure activity. Coordination normal. GCS eye subscore is 4. GCS verbal subscore is 5. GCS motor subscore is 6.  Skin: Skin is warm and dry. No rash noted.  Psychiatric: He has a normal mood and affect.    ED Course  Procedures (including critical care time)  Labs Reviewed  POCT I-STAT, CHEM 8 - Abnormal; Notable for the following:    Glucose, Bld 151 (*)    All other components within normal limits   Dg Chest 2 View  05/25/2012  *RADIOLOGY REPORT*  Clinical Data: Dizziness.  History of recent fall.  Right lower anterior rib pain.  CHEST - 2 VIEW  Comparison: Chest x-ray 03/03/2012.  Findings: Lung volumes are low.  No acute consolidative airspace disease.  No pneumothorax.  No definite pleural effusions. Probable mild bibasilar scarring, similar to prior examinations. Mild pulmonary venous congestion, without frank pulmonary edema. Mild cardiomegaly.  Large hiatal hernia.  Upper mediastinal contours are unremarkable.  Atherosclerosis in the thoracic aorta. Visualized bony thorax appears grossly intact.  IMPRESSION: 1.  Low lung volumes without radiographic evidence of acute cardiopulmonary disease. 2.  Mild cardiomegaly. 3.  Atherosclerosis. 4.  Large hiatal hernia.   Original Report Authenticated By: Trudie Reed, M.D.    Dg Wrist Complete Right  05/25/2012  *RADIOLOGY REPORT*  Clinical Data: Right wrist pain after fall  RIGHT WRIST - COMPLETE 3+ VIEW  Comparison: None.  Findings: No  fracture or dislocation is noted.  Sclerosis and narrowing of first carpometacarpal joint is noted consistent with osteoarthritis. Other joint spaces appear intact.  IMPRESSION: Degenerative changes as described above.  No acute abnormality seen in the right wrist.   Original Report Authenticated By: Lupita Raider.,  M.D.    Ct Head Wo Contrast  05/25/2012  *RADIOLOGY REPORT*  Clinical Data: Dizziness, recent fall, confusion  CT HEAD WITHOUT CONTRAST  Technique:  Contiguous axial images  were obtained from the base of the skull through the vertex without contrast.  Comparison: 05/24/2012  Findings:  There is mild diffuse atrophy.  Scattered periventricular and basal ganglia hypodensities compatible microvascular ischemic disease. No CT evidence of acute large territory infarct.  No intraparenchymal or extra-axial mass or hemorrhage.  Normal size and configuration of the ventricles and basilar cisterns. Vascular calcifications.  Regional soft tissues are normal.  No displaced calvarial fracture. Limited visualization of the paranasal sinuses and mastoid air cells are normal.  An osteoid osteoma versus an impacted molar is seen within the left maxillary sinus.  IMPRESSION: 1.  No acute intracranial process. 2.  Stable findings of atrophy and microvascular ischemic disease.   Original Report Authenticated By: Tacey Ruiz, MD    Ct Head Wo Contrast  05/24/2012  *RADIOLOGY REPORT*  Clinical Data: Larey Seat.  Hit head.  CT HEAD WITHOUT CONTRAST  Technique:  Contiguous axial images were obtained from the base of the skull through the vertex without contrast.  Comparison: None.  Findings: The ventricles are normal for age.  No extra-axial fluid collections are seen.  The brainstem and cerebellum are unremarkable. Moderate periventricular white matter disease.  No acute intracranial findings such as infarction or hemorrhage.  No mass lesions. Dense vascular calcifications.  The bony calvarium is intact.  The visualized  paranasal sinuses and mastoid air cells are clear.  IMPRESSION: No acute intracranial findings or skull fracture.   Original Report Authenticated By: Rudie Meyer, M.D.      1. Concussion, with loss of consciousness of unspecified duration, initial encounter   2. Sprain of wrist or hand, right, initial encounter       MDM  Pt without signs of fracture or serious injury.  He may have some mild concussive symptoms.  Doubt TIA, stroke. He has no deficits neruologically here in the ED.  He is alert, answering questions appropriately.    No sign of fracture of the wrist.  Pt mentioned upon discharge that his ribs are also a little sore.  Will perform chest xray but anticipate his injuries are soft tissue.  Offered prescription rx for pain but pt states he does not need it.     Celene Kras, MD 05/25/12 1239

## 2012-05-30 ENCOUNTER — Encounter: Payer: Self-pay | Admitting: Family Medicine

## 2012-05-30 ENCOUNTER — Ambulatory Visit (INDEPENDENT_AMBULATORY_CARE_PROVIDER_SITE_OTHER): Payer: Medicare Other | Admitting: Family Medicine

## 2012-05-30 VITALS — BP 130/84 | HR 74 | Temp 99.3°F | Wt 192.0 lb

## 2012-05-30 DIAGNOSIS — S060X1D Concussion with loss of consciousness of 30 minutes or less, subsequent encounter: Secondary | ICD-10-CM

## 2012-05-30 DIAGNOSIS — S0181XD Laceration without foreign body of other part of head, subsequent encounter: Secondary | ICD-10-CM

## 2012-05-30 DIAGNOSIS — S63501D Unspecified sprain of right wrist, subsequent encounter: Secondary | ICD-10-CM

## 2012-05-30 DIAGNOSIS — Z5189 Encounter for other specified aftercare: Secondary | ICD-10-CM

## 2012-05-30 MED ORDER — DIPHENOXYLATE-ATROPINE 2.5-0.025 MG PO TABS: 2.0000 | ORAL_TABLET | Freq: Four times a day (QID) | ORAL | Status: DC | PRN

## 2012-05-30 NOTE — Progress Notes (Signed)
  Subjective:    Patient ID: [name]., male    DOB: 19-Dec-1927, 79 y.o.   MRN: [id number]  HPI Here to follow up injuries form a fall on 05-24-12 when he stepped off a curb and fell forward. He struck his face on the ground and had a laceration along the right eyebrow. No LOC but he did sustain a concussion. He was seen in the ER that day and again on 05-25-12 after he had some spells of confusion at home. He had 2 successive CT scan of the head which were unremarkable. He has had no further HAs or nausea or neurologic deficits during the past 5 days. His main complain today is pain in the right wrist. Xrays in the ED were negative for fractures. He is wearing a wrist brace and a sling.    Review of Systems  Constitutional: Negative.   Respiratory: Negative.   Cardiovascular: Negative.   Musculoskeletal: Positive for joint swelling and arthralgias.  Neurological: Negative.        Objective:   Physical Exam  Constitutional: He is oriented to person, place, and time. He appears well-developed and well-nourished.  HENT:  There is a large closed laceration along the lateral right eyebrow, covered with [place]. There are some abrasions along the right zygoma  Cardiovascular: Normal rate, regular rhythm, normal heart sounds and intact distal pulses.   Pulmonary/Chest: Effort normal and breath sounds normal.  Musculoskeletal:  The right wrist is swollen and tender with full ROM   Neurological: He is alert and oriented to person, place, and time. He has normal reflexes. No cranial nerve deficit. He exhibits normal muscle tone. Coordination normal.          Assessment & Plan:  He seems to have recovered from the concussion, and the facial wounds are healing well. However the he has a significant wrist injury, probably some torn ligaments. We will have Orthopedics see him today.

## 2012-06-29 ENCOUNTER — Other Ambulatory Visit: Payer: Self-pay | Admitting: Family Medicine

## 2012-08-23 ENCOUNTER — Telehealth: Payer: Self-pay | Admitting: Family Medicine

## 2012-08-23 ENCOUNTER — Other Ambulatory Visit: Payer: Self-pay | Admitting: Family Medicine

## 2012-08-23 DIAGNOSIS — Z0000 Encounter for general adult medical examination without abnormal findings: Secondary | ICD-10-CM

## 2012-08-23 NOTE — Telephone Encounter (Signed)
I put future lab order in computer and I spoke with pt.

## 2012-09-19 ENCOUNTER — Other Ambulatory Visit (INDEPENDENT_AMBULATORY_CARE_PROVIDER_SITE_OTHER): Payer: Medicare Other

## 2012-09-19 DIAGNOSIS — Z0000 Encounter for general adult medical examination without abnormal findings: Secondary | ICD-10-CM

## 2012-09-19 DIAGNOSIS — R972 Elevated prostate specific antigen [PSA]: Secondary | ICD-10-CM

## 2012-09-19 DIAGNOSIS — E785 Hyperlipidemia, unspecified: Secondary | ICD-10-CM

## 2012-09-19 LAB — BASIC METABOLIC PANEL
BUN: 14 mg/dL (ref 6–23)
Calcium: 9.1 mg/dL (ref 8.4–10.5)
Chloride: 106 mEq/L (ref 96–112)
Creatinine, Ser: 0.8 mg/dL (ref 0.4–1.5)
GFR: 99.56 mL/min (ref >60.00)

## 2012-09-19 LAB — CBC WITH DIFFERENTIAL/PLATELET
Basophils Relative: 0.6 % (ref 0.0–3.0)
Eosinophils Absolute: 0.1 10*3/uL (ref 0.0–0.7)
HCT: 46.9 % (ref 39.0–52.0)
Hemoglobin: 15.8 g/dL (ref 13.0–17.0)
Lymphs Abs: 2.4 10*3/uL (ref 0.7–4.0)
MCHC: 33.8 g/dL (ref 30.0–36.0)
MCV: 93 fl (ref 78.0–100.0)
Monocytes Absolute: 0.5 10*3/uL (ref 0.1–1.0)
Neutro Abs: 3.8 10*3/uL (ref 1.4–7.7)
RBC: 5.05 Mil/uL (ref 4.22–5.81)

## 2012-09-19 LAB — LIPID PANEL
HDL: 37.6 mg/dL — ABNORMAL LOW (ref >39.00)
Total CHOL/HDL Ratio: 5

## 2012-09-19 LAB — URINALYSIS, ROUTINE W REFLEX MICROSCOPIC
Bilirubin Urine: NEGATIVE
Nitrite: NEGATIVE
Total Protein, Urine: NEGATIVE
WBC, UA: NONE SEEN (ref 0–?)
pH: 5.5 (ref 5.0–8.0)

## 2012-09-19 LAB — HEPATIC FUNCTION PANEL
AST: 20 U/L (ref 0–37)
Total Bilirubin: 1.3 mg/dL — ABNORMAL HIGH (ref 0.3–1.2)

## 2012-09-19 LAB — PSA: PSA: 20.9 ng/mL — ABNORMAL HIGH (ref 0.10–4.00)

## 2012-09-19 NOTE — Progress Notes (Signed)
Quick Note:  I left voice message with results. Pt has appointment on 09/25/12 will go over further then. ______

## 2012-09-25 ENCOUNTER — Ambulatory Visit (INDEPENDENT_AMBULATORY_CARE_PROVIDER_SITE_OTHER): Payer: Medicare Other | Admitting: Family Medicine

## 2012-09-25 ENCOUNTER — Encounter: Payer: Self-pay | Admitting: Family Medicine

## 2012-09-25 VITALS — BP 124/70 | HR 71 | Temp 98.8°F | Ht 69.0 in | Wt 190.0 lb

## 2012-09-25 DIAGNOSIS — I4891 Unspecified atrial fibrillation: Secondary | ICD-10-CM

## 2012-09-25 DIAGNOSIS — Z0000 Encounter for general adult medical examination without abnormal findings: Secondary | ICD-10-CM

## 2012-09-25 MED ORDER — DICYCLOMINE HCL 20 MG PO TABS: 20.0000 mg | ORAL_TABLET | Freq: Three times a day (TID) | ORAL | Status: DC

## 2012-09-25 MED ORDER — METOPROLOL TARTRATE 50 MG PO TABS: 50.0000 mg | ORAL_TABLET | Freq: Two times a day (BID) | ORAL | Status: DC

## 2012-09-25 MED ORDER — LEVOTHYROXINE SODIUM 50 MCG PO TABS: 50.0000 ug | ORAL_TABLET | Freq: Every day | ORAL | Status: DC

## 2012-09-25 NOTE — Progress Notes (Signed)
  Subjective:    Patient ID: [name]., male    DOB: August 16, 1927, 79 y.o.   MRN: [id number]  HPI 79 yr old male for a cpx. His only complaint is of generalized fatigue for the past few months. No chest pain or SOB or palpitations. His weight is stable. He has a hx of paroxysmal atrial fib, but this seemed to go away around 2012.    Review of Systems  Constitutional: Positive for fatigue. Negative for fever, chills, diaphoresis, activity change, appetite change and unexpected weight change.  HENT: Negative.   Eyes: Negative.   Respiratory: Negative.   Cardiovascular: Negative.   Gastrointestinal: Negative.   Genitourinary: Negative.   Musculoskeletal: Negative.   Skin: Negative.   Neurological: Negative.   Psychiatric/Behavioral: Negative.        Objective:   Physical Exam  Constitutional: He is oriented to person, place, and time. He appears well-developed and well-nourished. No distress.  HENT:  Head: Normocephalic and atraumatic.  Right Ear: External ear normal.  Left Ear: External ear normal.  Nose: Nose normal.  Mouth/Throat: Oropharynx is clear and moist. No oropharyngeal exudate.  Eyes: Conjunctivae and EOM are normal. Pupils are equal, round, and reactive to light. Right eye exhibits no discharge. Left eye exhibits no discharge. No scleral icterus.  Neck: Neck supple. No JVD present. No tracheal deviation present. No thyromegaly present.  Cardiovascular: Normal rate, normal heart sounds and intact distal pulses.  Exam reveals no gallop and no friction rub.   No murmur heard. Irregularly irregular rhythm. EKG shows atrial fibrillation with a controlled ventricular rate   Pulmonary/Chest: Effort normal and breath sounds normal. No respiratory distress. He has no wheezes. He has no rales. He exhibits no tenderness.  Abdominal: Soft. Bowel sounds are normal. He exhibits no distension and no mass. There is no tenderness. There is no rebound and no guarding.   Genitourinary: Rectum normal, prostate normal and penis normal. Guaiac negative stool. No penile tenderness.  Musculoskeletal: Normal range of motion. He exhibits no edema and no tenderness.  Lymphadenopathy:    He has no cervical adenopathy.  Neurological: He is alert and oriented to person, place, and time. He has normal reflexes. No cranial nerve deficit. He exhibits normal muscle tone. Coordination normal.  Skin: Skin is warm and dry. No rash noted. He is not diaphoretic. No erythema. No pallor.  Psychiatric: He has a normal mood and affect. His behavior is normal. Judgment and thought content normal.          Assessment & Plan:  Well exam. He is hypothyroid so we will start him on Synthroid and recheck a TSH in 90 days. He is on atrial fibrillation again so we will have him see cardiology soon.

## 2012-11-01 ENCOUNTER — Ambulatory Visit (INDEPENDENT_AMBULATORY_CARE_PROVIDER_SITE_OTHER): Payer: Medicare Other

## 2012-11-01 DIAGNOSIS — Z23 Encounter for immunization: Secondary | ICD-10-CM

## 2012-11-20 ENCOUNTER — Ambulatory Visit (INDEPENDENT_AMBULATORY_CARE_PROVIDER_SITE_OTHER): Payer: Medicare Other | Admitting: Internal Medicine

## 2012-11-20 ENCOUNTER — Encounter: Payer: Self-pay | Admitting: Internal Medicine

## 2012-11-20 VITALS — BP 105/73 | HR 73 | Ht 69.0 in | Wt 187.1 lb

## 2012-11-20 DIAGNOSIS — R0602 Shortness of breath: Secondary | ICD-10-CM

## 2012-11-20 DIAGNOSIS — E039 Hypothyroidism, unspecified: Secondary | ICD-10-CM

## 2012-11-20 DIAGNOSIS — I4891 Unspecified atrial fibrillation: Secondary | ICD-10-CM

## 2012-11-20 LAB — TSH: TSH: 2.45 u[IU]/mL (ref 0.35–5.50)

## 2012-11-20 NOTE — Patient Instructions (Signed)
Your physician recommends that you return for lab work in: today  Your physician has requested that you have an echocardiogram. Echocardiography is a painless test that uses sound waves to create images of your heart. It provides your doctor with information about the size and shape of your heart and how well your heart's chambers and valves are working. This procedure takes approximately one hour. There are no restrictions for this procedure.  Your physician has recommended that you wear a holter monitor. Holter monitors are medical devices that record the heart's electrical activity. Doctors most often use these monitors to diagnose arrhythmias. Arrhythmias are problems with the speed or rhythm of the heartbeat. The monitor is a small, portable device. You can wear one while you do your normal daily activities. This is usually used to diagnose what is causing palpitations/syncope (passing out).  Your physician wants you to follow-up in: 6 months. You will receive a reminder letter in the mail two months in advance. If you don't receive a letter, please call our office to schedule the follow-up appointment.

## 2012-11-20 NOTE — Progress Notes (Signed)
HPI Patient has a long history of afib  I saw him in 2012 while hospitalized at [facility]  Was in afib atthat time  Did not sense palpitations.  F/U with Dr Scotty Court  Not on coumadin  Patient seen by Dr Clent Ridges in August  Complained of fatigue, dyspnea.  No palpitations.  No CP Thyroid supplemented  Was in afib.     No CP  Occasional pain in L shoulder  Attrib to clavicle fx in past.  Occurs with and without activity  Patient has had 2 occasions One fall  Stopped off curb  Hit head  Next am go up  Passed out at table.  No Known Allergies  Current Outpatient Prescriptions  Medication Sig Dispense Refill  . aspirin 325 MG tablet Take 325 mg by mouth daily.       . Cholecalciferol (VITAMIN D) 1000 UNITS capsule Take 2,000 Units by mouth every morning.       . dicyclomine (BENTYL) 20 MG tablet Take 1 tablet (20 mg total) by mouth 3 (three) times daily.  270 tablet  3  . diphenoxylate-atropine (LOMOTIL) 2.5-0.025 MG per tablet Take 2 tablets by mouth 4 (four) times daily as needed for diarrhea or loose stools.  60 tablet  5  . fish oil-omega-3 fatty acids 1000 MG capsule Take 1 g by mouth every morning.       [name] ibuprofen (ADVIL,MOTRIN) 200 MG tablet Take 200 mg by mouth every 6 (six) hours as needed for pain (pain).      [name] levothyroxine (SYNTHROID, LEVOTHROID) 50 MCG tablet Take 1 tablet (50 mcg total) by mouth daily.  90 tablet  3  . metoprolol (LOPRESSOR) 50 MG tablet Take 1 tablet (50 mg total) by mouth 2 (two) times daily.  180 tablet  3  . vitamin C (ASCORBIC ACID) 500 MG tablet Take 500 mg by mouth every morning.        No current facility-administered medications for this visit.    Past Medical History  Diagnosis Date  . Personal history of colonic polyps     tubular adenoma  . Hypertension   . Irritable bowel syndrome   . Prostate cancer 1991    sees Dr. Gala Lewandowsky at [place], observation only   . Paroxysmal atrial fibrillation     Past Surgical History  Procedure Laterality Date  .  Hemorrhoid surgery    . Prostate biopsy    . Colonoscopy  2011    per Dr. Jarold Motto, benign polyps, no repeats needed   . Cataract extraction, bilateral  2014    per Dr. Nile Riggs     Family History  Problem Relation Age of Onset  . Diabetes    . Hypertension    . Lung cancer Brother   . Sudden death    . Heart disease    . Stroke    . Heart disease Son   . Stomach cancer Brother     History   Social History  . Marital Status: Married    Spouse Name: N/A    Number of Children: 3  . Years of Education: N/A   Occupational History  . Retired    Social History Main Topics  . Smoking status: Never Smoker   . Smokeless tobacco: Never Used  . Alcohol Use: No  . Drug Use: No  . Sexual Activity: No   Other Topics Concern  . Not on file   Social History Narrative  . No narrative on file  Review of Systems:  All systems reviewed.  They are negative to the above problem except as previously stated.  Vital Signs: BP 105/73  Pulse 73  Ht 5\' 9"  (1.753 m)  Wt 187 lb 1.9 oz (84.877 kg)  BMI 27.62 kg/m2  Physical Exam Patient is in NAD HEENT:  Normocephalic, atraumatic. EOMI, PERRLA.  Neck: JVP is normal.  No bruits.  Lungs: clear to auscultation. No rales no wheezes.  Heart: Regular rate and rhythm. Normal S1, S2. No S3.   No significant murmurs. PMI not displaced.  Abdomen:  Supple, nontender. Normal bowel sounds. No masses. No hepatomegaly.  Extremities:   Good distal pulses throughout. No lower extremity edema.  Musculoskeletal :moving all extremities.  Neuro:   alert and oriented x3.  CN II-XII grossly intact.  Assessment and Plan:  1. Fatigue.  I am not convinced it is related to his afib  I would get an echo and a holter monitor to evaluate  2.  Atrial fib Discussed risks of CVA with patient  I would recomm Xarelto or Eliquis  Patient is reflecting  Will get back with him    3. Hypothyroid  Continue supplement.

## 2012-11-23 ENCOUNTER — Telehealth: Payer: Self-pay | Admitting: Internal Medicine

## 2012-11-23 NOTE — Telephone Encounter (Signed)
Follow Up  Pt returning a call for thyroid test results.

## 2012-11-23 NOTE — Telephone Encounter (Signed)
Spoke with pt, aware of normal tsh

## 2012-12-01 ENCOUNTER — Encounter (INDEPENDENT_AMBULATORY_CARE_PROVIDER_SITE_OTHER): Payer: Medicare Other

## 2012-12-01 ENCOUNTER — Ambulatory Visit (HOSPITAL_COMMUNITY): Payer: Medicare Other | Attending: Internal Medicine | Admitting: Cardiology

## 2012-12-01 ENCOUNTER — Encounter: Payer: Self-pay | Admitting: Radiology

## 2012-12-01 ENCOUNTER — Other Ambulatory Visit (HOSPITAL_COMMUNITY): Payer: Self-pay | Admitting: Family Medicine

## 2012-12-01 DIAGNOSIS — I4891 Unspecified atrial fibrillation: Secondary | ICD-10-CM

## 2012-12-01 DIAGNOSIS — R0602 Shortness of breath: Secondary | ICD-10-CM

## 2012-12-01 NOTE — Progress Notes (Signed)
Patient ID: [name]., male   DOB: Jul 16, 1927, 79 y.o.   MRN: [id number] E Cardio 24 hr Holter Monitor applied

## 2012-12-01 NOTE — Progress Notes (Signed)
Echo performed. 

## 2012-12-11 ENCOUNTER — Telehealth: Payer: Self-pay | Admitting: *Deleted

## 2012-12-11 NOTE — Telephone Encounter (Signed)
Left message for pt to call, monitor reviewed by dr Tenny Craw shows atrial fib. Rates 38 to 109 bpm. Average HR 72 bpm. Rare PVC's, longest pause 2.75 sec. Keep on the same regimen.

## 2012-12-11 NOTE — Telephone Encounter (Signed)
Spoke with pt, aware of monitor and echo results

## 2013-02-09 ENCOUNTER — Telehealth: Payer: Self-pay | Admitting: Family Medicine

## 2013-02-09 NOTE — Telephone Encounter (Signed)
Per Dr. Clent Ridges okay to order lab and call pt to let him know.

## 2013-02-09 NOTE — Telephone Encounter (Signed)
Pt left a voice message and stated that he needed [place] to order a PSA and pt needs to have drawn on 02/12/13 at [facility]. He stated that this is something we normally handle and then he follows up with maybe [facility]?

## 2013-02-12 ENCOUNTER — Other Ambulatory Visit (INDEPENDENT_AMBULATORY_CARE_PROVIDER_SITE_OTHER): Payer: Medicare Other

## 2013-02-12 ENCOUNTER — Other Ambulatory Visit: Payer: Self-pay | Admitting: Family Medicine

## 2013-02-12 DIAGNOSIS — R972 Elevated prostate specific antigen [PSA]: Secondary | ICD-10-CM

## 2013-02-12 NOTE — Telephone Encounter (Signed)
I put future lab order in for [name] and pt is there now to have labs drawn.

## 2013-02-22 HISTORY — PX: CARDIAC CATHETERIZATION: SHX172

## 2013-02-25 ENCOUNTER — Inpatient Hospital Stay (HOSPITAL_COMMUNITY)
Admission: EM | Admit: 2013-02-25 | Discharge: 2013-03-06 | DRG: 217 | Disposition: A | Discharge status: Skilled Nursing Facility | Payer: Medicare HMO | Attending: Thoracic Surgery (Cardiothoracic Vascular Surgery) | Admitting: Thoracic Surgery (Cardiothoracic Vascular Surgery)

## 2013-02-25 ENCOUNTER — Encounter (HOSPITAL_COMMUNITY): Payer: Self-pay | Admitting: Emergency Medicine

## 2013-02-25 ENCOUNTER — Emergency Department (HOSPITAL_COMMUNITY): Payer: Medicare HMO

## 2013-02-25 DIAGNOSIS — J988 Other specified respiratory disorders: Secondary | ICD-10-CM | POA: Diagnosis not present

## 2013-02-25 DIAGNOSIS — I200 Unstable angina: Secondary | ICD-10-CM | POA: Diagnosis present

## 2013-02-25 DIAGNOSIS — Z860100 Personal history of colon polyps, unspecified: Secondary | ICD-10-CM

## 2013-02-25 DIAGNOSIS — I080 Rheumatic disorders of both mitral and aortic valves: Secondary | ICD-10-CM | POA: Diagnosis present

## 2013-02-25 DIAGNOSIS — Z8679 Personal history of other diseases of the circulatory system: Secondary | ICD-10-CM

## 2013-02-25 DIAGNOSIS — I959 Hypotension, unspecified: Secondary | ICD-10-CM | POA: Diagnosis not present

## 2013-02-25 DIAGNOSIS — G8918 Other acute postprocedural pain: Secondary | ICD-10-CM | POA: Diagnosis not present

## 2013-02-25 DIAGNOSIS — Z823 Family history of stroke: Secondary | ICD-10-CM

## 2013-02-25 DIAGNOSIS — J9819 Other pulmonary collapse: Secondary | ICD-10-CM | POA: Diagnosis not present

## 2013-02-25 DIAGNOSIS — E8779 Other fluid overload: Secondary | ICD-10-CM | POA: Diagnosis not present

## 2013-02-25 DIAGNOSIS — R269 Unspecified abnormalities of gait and mobility: Secondary | ICD-10-CM | POA: Diagnosis present

## 2013-02-25 DIAGNOSIS — R079 Chest pain, unspecified: Secondary | ICD-10-CM

## 2013-02-25 DIAGNOSIS — E785 Hyperlipidemia, unspecified: Secondary | ICD-10-CM | POA: Diagnosis present

## 2013-02-25 DIAGNOSIS — K589 Irritable bowel syndrome without diarrhea: Secondary | ICD-10-CM | POA: Diagnosis present

## 2013-02-25 DIAGNOSIS — E039 Hypothyroidism, unspecified: Secondary | ICD-10-CM | POA: Diagnosis present

## 2013-02-25 DIAGNOSIS — Y832 Surgical operation with anastomosis, bypass or graft as the cause of abnormal reaction of the patient, or of later complication, without mention of misadventure at the time of the procedure: Secondary | ICD-10-CM | POA: Diagnosis present

## 2013-02-25 DIAGNOSIS — E876 Hypokalemia: Secondary | ICD-10-CM | POA: Diagnosis not present

## 2013-02-25 DIAGNOSIS — Z79899 Other long term (current) drug therapy: Secondary | ICD-10-CM

## 2013-02-25 DIAGNOSIS — I2582 Chronic total occlusion of coronary artery: Secondary | ICD-10-CM | POA: Diagnosis present

## 2013-02-25 DIAGNOSIS — D62 Acute posthemorrhagic anemia: Secondary | ICD-10-CM | POA: Diagnosis not present

## 2013-02-25 DIAGNOSIS — Z801 Family history of malignant neoplasm of trachea, bronchus and lung: Secondary | ICD-10-CM

## 2013-02-25 DIAGNOSIS — I509 Heart failure, unspecified: Secondary | ICD-10-CM | POA: Diagnosis present

## 2013-02-25 DIAGNOSIS — E46 Unspecified protein-calorie malnutrition: Secondary | ICD-10-CM | POA: Diagnosis present

## 2013-02-25 DIAGNOSIS — I10 Essential (primary) hypertension: Secondary | ICD-10-CM | POA: Diagnosis present

## 2013-02-25 DIAGNOSIS — I5032 Chronic diastolic (congestive) heart failure: Secondary | ICD-10-CM | POA: Diagnosis present

## 2013-02-25 DIAGNOSIS — R112 Nausea with vomiting, unspecified: Secondary | ICD-10-CM | POA: Diagnosis not present

## 2013-02-25 DIAGNOSIS — Z800 Family history of malignant neoplasm of digestive organs: Secondary | ICD-10-CM

## 2013-02-25 DIAGNOSIS — Z7982 Long term (current) use of aspirin: Secondary | ICD-10-CM

## 2013-02-25 DIAGNOSIS — Z9849 Cataract extraction status, unspecified eye: Secondary | ICD-10-CM

## 2013-02-25 DIAGNOSIS — I2510 Atherosclerotic heart disease of native coronary artery without angina pectoris: Principal | ICD-10-CM | POA: Diagnosis present

## 2013-02-25 DIAGNOSIS — Z951 Presence of aortocoronary bypass graft: Secondary | ICD-10-CM

## 2013-02-25 DIAGNOSIS — Z8249 Family history of ischemic heart disease and other diseases of the circulatory system: Secondary | ICD-10-CM

## 2013-02-25 DIAGNOSIS — Z8546 Personal history of malignant neoplasm of prostate: Secondary | ICD-10-CM

## 2013-02-25 DIAGNOSIS — I440 Atrioventricular block, first degree: Secondary | ICD-10-CM | POA: Diagnosis not present

## 2013-02-25 DIAGNOSIS — F05 Delirium due to known physiological condition: Secondary | ICD-10-CM | POA: Diagnosis not present

## 2013-02-25 DIAGNOSIS — Z833 Family history of diabetes mellitus: Secondary | ICD-10-CM

## 2013-02-25 DIAGNOSIS — I480 Paroxysmal atrial fibrillation: Secondary | ICD-10-CM | POA: Diagnosis present

## 2013-02-25 DIAGNOSIS — Z9889 Other specified postprocedural states: Secondary | ICD-10-CM

## 2013-02-25 DIAGNOSIS — C61 Malignant neoplasm of prostate: Secondary | ICD-10-CM | POA: Diagnosis present

## 2013-02-25 DIAGNOSIS — Y9210 Unspecified residential institution as the place of occurrence of the external cause: Secondary | ICD-10-CM | POA: Diagnosis present

## 2013-02-25 DIAGNOSIS — I4891 Unspecified atrial fibrillation: Secondary | ICD-10-CM

## 2013-02-25 DIAGNOSIS — R55 Syncope and collapse: Secondary | ICD-10-CM | POA: Diagnosis not present

## 2013-02-25 DIAGNOSIS — Z7901 Long term (current) use of anticoagulants: Secondary | ICD-10-CM

## 2013-02-25 DIAGNOSIS — F411 Generalized anxiety disorder: Secondary | ICD-10-CM | POA: Diagnosis present

## 2013-02-25 HISTORY — DX: Presence of aortocoronary bypass graft: Z95.1

## 2013-02-25 HISTORY — DX: Personal history of other diseases of the circulatory system: Z86.79

## 2013-02-25 HISTORY — DX: Other specified postprocedural states: Z98.890

## 2013-02-25 LAB — CBC
HEMATOCRIT: 42 % (ref 39.0–52.0)
Hemoglobin: 15.1 g/dL (ref 13.0–17.0)
MCH: 31.7 pg (ref 26.0–34.0)
MCHC: 36 g/dL (ref 30.0–36.0)
MCV: 88.2 fL (ref 78.0–100.0)
Platelets: 160 10*3/uL (ref 150–400)
RBC: 4.76 MIL/uL (ref 4.22–5.81)
RDW: 13 % (ref 11.5–15.5)
WBC: 5.6 10*3/uL (ref 4.0–10.5)

## 2013-02-25 LAB — PROTIME-INR
INR: 1.16 (ref 0.00–1.49)
PROTHROMBIN TIME: 14.6 s (ref 11.6–15.2)

## 2013-02-25 LAB — APTT: APTT: 27 s (ref 24–37)

## 2013-02-25 LAB — COMPREHENSIVE METABOLIC PANEL
ALT: 23 U/L (ref 0–53)
AST: 27 U/L (ref 0–37)
Albumin: 3.4 g/dL — ABNORMAL LOW (ref 3.5–5.2)
Alkaline Phosphatase: 68 U/L (ref 39–117)
BUN: 23 mg/dL (ref 6–23)
CO2: 24 meq/L (ref 19–32)
Calcium: 9.1 mg/dL (ref 8.4–10.5)
Chloride: 107 mEq/L (ref 96–112)
Creatinine, Ser: 0.9 mg/dL (ref 0.50–1.35)
GFR, EST AFRICAN AMERICAN: 89 mL/min — AB (ref >90)
GFR, EST NON AFRICAN AMERICAN: 77 mL/min — AB (ref >90)
GLUCOSE: 103 mg/dL — AB (ref 70–99)
Potassium: 3.9 mEq/L (ref 3.7–5.3)
Sodium: 142 mEq/L (ref 137–147)
Total Bilirubin: 0.4 mg/dL (ref 0.3–1.2)
Total Protein: 6.5 g/dL (ref 6.0–8.3)

## 2013-02-25 LAB — POCT I-STAT TROPONIN I: Troponin i, poc: 0.05 ng/mL (ref 0.00–0.08)

## 2013-02-25 MED ORDER — SODIUM CHLORIDE 0.9 % IV SOLN
1000.0000 mL | INTRAVENOUS | Status: DC
  Administered 2013-02-25: 1000 mL via INTRAVENOUS

## 2013-02-25 MED ORDER — ASPIRIN 81 MG PO CHEW
324.0000 mg | CHEWABLE_TABLET | Freq: Once | ORAL | Status: AC
  Administered 2013-02-25: 324 mg via ORAL
  Filled 2013-02-25 (×2): qty 4

## 2013-02-25 NOTE — ED Provider Notes (Signed)
CSN: [id number]     Arrival date & time 02/25/13  1924 History   First MD Initiated Contact with Patient 02/25/13 2029     Chief Complaint  Patient presents with  . Chest Pain    Patient is a 80 y.o. male presenting with chest pain. The history is provided by the patient.  Chest Pain Pain location:  L chest Pain quality: aching   Pain radiates to:  L shoulder Pain severity:  Moderate Duration: 10-20. Timing:  Intermittent Chronicity:  New Context comment:  This would occur with exertion Relieved by:  Rest Associated symptoms: fever   Associated symptoms: no abdominal pain, no shortness of breath and not vomiting    the patient does not have a history of coronary artery disease. He does have history date fibrillation. He does not take any anticoagulation other than aspirin. The patient is followed by Dr. Harrington Challenger. Patient denies any previous episodes of chest pain. He's never had a stress test or heart catheterization. This is the first time he's never had any type of chest discomfort  Past Medical History  Diagnosis Date  . Personal history of colonic polyps     tubular adenoma  . Hypertension   . Irritable bowel syndrome   . Prostate cancer 1991    sees Dr. Carlota Raspberry at [place], observation only   . Paroxysmal atrial fibrillation   . Atrial fibrillation    Past Surgical History  Procedure Laterality Date  . Hemorrhoid surgery    . Prostate biopsy    . Colonoscopy  2011    per Dr. Sharlett Iles, benign polyps, no repeats needed   . Cataract extraction, bilateral  2014    per Dr. Gershon Crane    Family History  Problem Relation Age of Onset  . Diabetes    . Hypertension    . Lung cancer Brother   . Sudden death    . Heart disease    . Stroke    . Heart disease Son   . Stomach cancer Brother    History  Substance Use Topics  . Smoking status: Never Smoker   . Smokeless tobacco: Never Used  . Alcohol Use: No    Review of Systems  Constitutional: Positive for fever.   Respiratory: Negative for shortness of breath.   Cardiovascular: Positive for chest pain.  Gastrointestinal: Negative for vomiting and abdominal pain.  All other systems reviewed and are negative.    Allergies  Review of patient's allergies indicates no known allergies.  Home Medications   Current Outpatient Rx  Name  Route  Sig  Dispense  Refill  . aspirin 325 MG tablet   Oral   Take 325 mg by mouth daily.          . Cholecalciferol (VITAMIN D) 1000 UNITS capsule   Oral   Take 2,000 Units by mouth every morning.          . fish oil-omega-3 fatty acids 1000 MG capsule   Oral   Take 1 g by mouth every morning.          [name] ibuprofen (ADVIL,MOTRIN) 200 MG tablet   Oral   Take 200 mg by mouth every 6 (six) hours as needed for pain (pain).         [name] levothyroxine (SYNTHROID, LEVOTHROID) 50 MCG tablet   Oral   Take 1 tablet (50 mcg total) by mouth daily.   90 tablet   3   . metoprolol (LOPRESSOR) 50 MG tablet  Oral   Take 1 tablet (50 mg total) by mouth 2 (two) times daily.   180 tablet   3   . vitamin C (ASCORBIC ACID) 500 MG tablet   Oral   Take 500 mg by mouth every morning.           BP 156/91  Temp(Src) 98.1 F (36.7 C) (Oral)  Resp 15  Ht 5\' 9"  (1.753 m)  Wt 189 lb (85.73 kg)  BMI 27.90 kg/m2  SpO2 99% Physical Exam  Nursing note and vitals reviewed. Constitutional: He appears well-developed and well-nourished. No distress.  HENT:  Head: Normocephalic and atraumatic.  Right Ear: External ear normal.  Left Ear: External ear normal.  Eyes: Conjunctivae are normal. Right eye exhibits no discharge. Left eye exhibits no discharge. No scleral icterus.  Neck: Neck supple. No tracheal deviation present.  Cardiovascular: Normal rate and intact distal pulses.  An irregularly irregular rhythm present.  Pulmonary/Chest: Effort normal and breath sounds normal. No stridor. No respiratory distress. He has no wheezes. He has no rales.  Abdominal: Soft.  Bowel sounds are normal. He exhibits no distension. There is no tenderness. There is no rebound and no guarding.  Musculoskeletal: He exhibits no edema and no tenderness.  Neurological: He is alert. He has normal strength. No sensory deficit. Cranial nerve deficit:  no gross defecits noted. He exhibits normal muscle tone. He displays no seizure activity. Coordination normal.  Skin: Skin is warm and dry. No rash noted.  Psychiatric: He has a normal mood and affect.    ED Course  Procedures (including critical care time) Labs Review Labs Reviewed  COMPREHENSIVE METABOLIC PANEL - Abnormal; Notable for the following:    Glucose, Bld 103 (*)    Albumin 3.4 (*)    GFR calc non Af Amer 77 (*)    GFR calc Af Amer 89 (*)    All other components within normal limits  CBC  PROTIME-INR  APTT  POCT I-STAT TROPONIN I   Imaging Review Dg Chest Portable 1 View  02/25/2013   CLINICAL DATA:  Chest pain  EXAM: PORTABLE CHEST - 1 VIEW  COMPARISON:  DG CHEST 2 VIEW dated 05/25/2012; DG CHEST 2 VIEW dated 03/03/2012; DG CHEST 2 VIEW dated 12/06/2010  FINDINGS: There is stable mild cardiomegaly. Atherosclerotic calcification thoracic aortic arch is noted. Lung volumes are slightly low. Interstitial markings are mildly prominent, and unchanged compared to prior studies. Negative for edema, focal airspace disease, or visible pleural effusion. Negative for pneumothorax. A large hiatal hernia contour is similar to priors. Remote healed left clavicle fracture noted. No acute osseous abnormality.  IMPRESSION: 1. Low lung volumes with chronic mild interstitial prominence. No definite acute findings. 2. Stable cardiomegaly. 3. Stable large hiatal hernia. 4. Atherosclerosis.   Electronically Signed   By: Curlene Dolphin M.D.   On: 02/25/2013 22:06    EKG Interpretation    Date/Time:  Sunday February 25 2013 19:31:00 EST Ventricular Rate:  106 PR Interval:  189 QRS Duration: 93 QT Interval:  360 QTC Calculation: 478 R  Axis:   10 Text Interpretation:  Atrial fibrillation Borderline repol abnormality, lateral leads Borderline prolonged QT interval Baseline wander in lead(s) V1 V3 V4 V6 No significant change since last tracing Confirmed by Braelyn Jenson  MD-J, Tineka Uriegas ([id number]) on 02/25/2013 7:35:50 PM           Medications  0.9 %  sodium chloride infusion (1,000 mLs Intravenous New Bag/Given 02/25/13 2129)  aspirin chewable tablet 324 mg (  324 mg Oral Given 02/25/13 2155)    MDM   1. Chest pain    Pt has history of atrial fibrillation but no CAD.  Symptoms are concerning for new exercise induced angina.  Will consult medicine regarding admission for rule out, further evaluation.    Kathalene Frames, MD 02/25/13 [phone]

## 2013-02-25 NOTE — H&P (Addendum)
PCP: [name], MD  Cardiology Dr. Harrington Challenger  Chief Complaint:  Chest pain  HPI: [name]. is a 80 y.o. male   has a past medical history of Personal history of colonic polyps; Hypertension; Irritable bowel syndrome; Prostate cancer (1991); Paroxysmal atrial fibrillation; and Atrial fibrillation.   Presented with  Patient has hx of A.fib but no known hx of CAD. Last stress test was 1983. Patient was doing some unpacking and heavy lifting. He went up and down the stairs and started to noted that he was having chest pain with walking up the stairs and lifting boxes. The pain would last up to 20 min and would go away with rest. Felt like tightness it radiated slightly to left shoulder. Denies shortness of breath or diaphoresis associated with this. This is the most activity he has done in a while.  Currently chest pain free.   Review of Systems:    Pertinent positives include: chest pain,   Constitutional:  No weight loss, night sweats, Fevers, chills, fatigue, weight loss  HEENT:  No headaches, Difficulty swallowing,Tooth/dental problems,Sore throat,  No sneezing, itching, ear ache, nasal congestion, post nasal drip,  Cardio-vascular:  No  Orthopnea, PND, anasarca, dizziness, palpitations.no Bilateral lower extremity swelling  GI:  No heartburn, indigestion, abdominal pain, nausea, vomiting, diarrhea, change in bowel habits, loss of appetite, melena, blood in stool, hematemesis Resp:  no shortness of breath at rest. No dyspnea on exertion, No excess mucus, no productive cough, No non-productive cough, No coughing up of blood.No change in color of mucus.No wheezing. Skin:  no rash or lesions. No jaundice GU:  no dysuria, change in color of urine, no urgency or frequency. No straining to urinate.  No flank pain.  Musculoskeletal:  No joint pain or no joint swelling. No decreased range of motion. No back pain.  Psych:  No change in mood or affect. No depression or anxiety. No  memory loss.  Neuro: no localizing neurological complaints, no tingling, no weakness, no double vision, no gait abnormality, no slurred speech, no confusion  Otherwise ROS are negative except for above, 10 systems were reviewed  Past Medical History: Past Medical History  Diagnosis Date  . Personal history of colonic polyps     tubular adenoma  . Hypertension   . Irritable bowel syndrome   . Prostate cancer 1991    sees Dr. Carlota Raspberry at [place], observation only   . Paroxysmal atrial fibrillation   . Atrial fibrillation    Past Surgical History  Procedure Laterality Date  . Hemorrhoid surgery    . Prostate biopsy    . Colonoscopy  2011    per Dr. Sharlett Iles, benign polyps, no repeats needed   . Cataract extraction, bilateral  2014    per Dr. Gershon Crane      Medications: Prior to Admission medications   Medication Sig Start Date End Date Taking? Authorizing Provider  aspirin 325 MG tablet Take 325 mg by mouth daily.    Yes Historical Provider, MD  Cholecalciferol (VITAMIN D) 1000 UNITS capsule Take 2,000 Units by mouth every morning.    Yes Historical Provider, MD  fish oil-omega-3 fatty acids 1000 MG capsule Take 1 g by mouth every morning.    Yes Historical Provider, MD  ibuprofen (ADVIL,MOTRIN) 200 MG tablet Take 200 mg by mouth every 6 (six) hours as needed for pain (pain).   Yes Historical Provider, MD  levothyroxine (SYNTHROID, LEVOTHROID) 50 MCG tablet Take 1 tablet (50 mcg total) by mouth  daily. 09/25/12  Yes [name], MD  metoprolol (LOPRESSOR) 50 MG tablet Take 1 tablet (50 mg total) by mouth 2 (two) times daily. 09/25/12  Yes [name], MD  vitamin C (ASCORBIC ACID) 500 MG tablet Take 500 mg by mouth every morning.    Yes Historical Provider, MD    Allergies:  No Known Allergies  Social History:  Ambulatory   Independently  Lives at  Home with family   reports that he has never smoked. He has never used smokeless tobacco. He reports that he does not drink  alcohol or use illicit drugs.   Family History: family history includes Diabetes in an other family member; Heart disease in his son and another family member; Hypertension in an other family member; Lung cancer in his brother; Stomach cancer in his brother; Stroke in an other family member; Sudden death in an other family member.    Physical Exam: Patient Vitals for the past 24 hrs:  BP Temp Temp src Resp SpO2 Height Weight  02/25/13 1929 156/91 mmHg 98.1 F (36.7 C) Oral 15 99 % 5\' 9"  (1.753 m) 85.73 kg (189 lb)    1. General:  in No Acute distress 2. Psychological: Alert and Oriented 3. Head/ENT:   Moist Mucous Membranes                          Head Non traumatic, neck supple                          Normal Dentition 4. SKIN: normal   Skin turgor,  Skin clean Dry and intact no rash 5. Heart: Regular rate and rhythm no Murmur, Rub or gallop 6. Lungs: Clear to auscultation bilaterally, no wheezes or crackles   7. Abdomen: Soft, non-tender, Non distended 8. Lower extremities: no clubbing, cyanosis, or edema 9. Neurologically Grossly intact, moving all 4 extremities equally 10. MSK: Normal range of motion  body mass index is 27.9 kg/(m^2).   Labs on Admission:   Recent Labs  02/25/13 2133  NA 142  K 3.9  CL 107  CO2 24  GLUCOSE 103*  BUN 23  CREATININE 0.90  CALCIUM 9.1    Recent Labs  02/25/13 2133  AST 27  ALT 23  ALKPHOS 68  BILITOT 0.4  PROT 6.5  ALBUMIN 3.4*   No results found for this basename: LIPASE, AMYLASE,  in the last 72 hours  Recent Labs  02/25/13 2133  WBC 5.6  HGB 15.1  HCT 42.0  MCV 88.2  PLT 160   No results found for this basename: CKTOTAL, CKMB, CKMBINDEX, TROPONINI,  in the last 72 hours No results found for this basename: TSH, T4TOTAL, FREET3, T3FREE, THYROIDAB,  in the last 72 hours No results found for this basename: VITAMINB12, FOLATE, FERRITIN, TIBC, IRON, RETICCTPCT,  in the last 72 hours No results found for this  basename: HGBA1C    Estimated Creatinine Clearance: 67.5 ml/min (by C-G formula based on Cr of 0.9). ABG    Component Value Date/Time   TCO2 28 05/25/2012 1123     No results found for this basename: DDIMER     Other results:  I have pearsonaly reviewed this: ECG REPORT  Rate: 106  Rhythm: a.fib ST&T Change: slight ST segment depresion in V4-v6 possibly rate related   Cultures: No results found for this basename: sdes, specrequest, cult, reptstatus  Radiological Exams on Admission: Dg Chest Portable 1 View  02/25/2013   CLINICAL DATA:  Chest pain  EXAM: PORTABLE CHEST - 1 VIEW  COMPARISON:  DG CHEST 2 VIEW dated 05/25/2012; DG CHEST 2 VIEW dated 03/03/2012; DG CHEST 2 VIEW dated 12/06/2010  FINDINGS: There is stable mild cardiomegaly. Atherosclerotic calcification thoracic aortic arch is noted. Lung volumes are slightly low. Interstitial markings are mildly prominent, and unchanged compared to prior studies. Negative for edema, focal airspace disease, or visible pleural effusion. Negative for pneumothorax. A large hiatal hernia contour is similar to priors. Remote healed left clavicle fracture noted. No acute osseous abnormality.  IMPRESSION: 1. Low lung volumes with chronic mild interstitial prominence. No definite acute findings. 2. Stable cardiomegaly. 3. Stable large hiatal hernia. 4. Atherosclerosis.   Electronically Signed   By: Curlene Dolphin M.D.   On: 02/25/2013 22:06    Chart has been reviewed  Assessment/Plan  80 year old gentleman with history of hypertension, atrial fibrillation on aspirin 325 presents with new chest pain induced by activity worrisome for angina  Present on Admission:  . Chest pain on exertion - worrisome for unstable angina. Slightly elevated troponin. Will transfer to [facility]. Initiate heparin have spoken to cardiology who will see patient once he arrives. N.p.o. for possible procedure in a.m. We'll continue to cycle cardiac markers monitor on  telemetry obtain serial ECG. will obtain lipid panel, daily aspirin  . Atrial fibrillation - at this point rate control we'll continue metoprolol and aspirin  . HYPERTENSION continue metoprolol currently under good control   Prophylaxis:  Heparin  CODE STATUS: FULL CODE  Other plan as per orders.  I have spent a total of 65 min on this admission extra time has been spent to discuss care of with cardiology on call  [place] 02/25/2013, 10:56 PM

## 2013-02-25 NOTE — ED Notes (Signed)
Patient is alert and oriented x3.  He is complaining of intermittent chest pain that started today. He states that the chest pain only happens when he is exerting himself and resolves when he rest. He denies every having this problem before.  Currently he denies pain while sitting.

## 2013-02-26 ENCOUNTER — Other Ambulatory Visit: Payer: Self-pay | Admitting: *Deleted

## 2013-02-26 ENCOUNTER — Encounter (HOSPITAL_COMMUNITY)
Admission: EM | Disposition: A | Discharge status: Skilled Nursing Facility | Payer: Self-pay | Source: Home / Self Care | Attending: Thoracic Surgery (Cardiothoracic Vascular Surgery)

## 2013-02-26 DIAGNOSIS — I200 Unstable angina: Secondary | ICD-10-CM | POA: Diagnosis present

## 2013-02-26 DIAGNOSIS — I2510 Atherosclerotic heart disease of native coronary artery without angina pectoris: Secondary | ICD-10-CM

## 2013-02-26 HISTORY — PX: LEFT HEART CATHETERIZATION WITH CORONARY ANGIOGRAM: SHX5451

## 2013-02-26 LAB — POCT ACTIVATED CLOTTING TIME: Activated Clotting Time: 177 seconds

## 2013-02-26 LAB — CBC
HCT: 41.3 % (ref 39.0–52.0)
HEMATOCRIT: 42.9 % (ref 39.0–52.0)
Hemoglobin: 15 g/dL (ref 13.0–17.0)
Hemoglobin: 14.6 g/dL (ref 13.0–17.0)
MCH: 31.1 pg (ref 26.0–34.0)
MCH: 31 pg (ref 26.0–34.0)
MCHC: 35 g/dL (ref 30.0–36.0)
MCHC: 35.4 g/dL (ref 30.0–36.0)
MCV: 89 fL (ref 78.0–100.0)
MCV: 87.7 fL (ref 78.0–100.0)
PLATELETS: 147 10*3/uL — AB (ref 150–400)
Platelets: 159 10*3/uL (ref 150–400)
RBC: 4.82 MIL/uL (ref 4.22–5.81)
RBC: 4.71 MIL/uL (ref 4.22–5.81)
RDW: 13.1 % (ref 11.5–15.5)
RDW: 13 % (ref 11.5–15.5)
WBC: 5.6 10*3/uL (ref 4.0–10.5)
WBC: 5.1 10*3/uL (ref 4.0–10.5)

## 2013-02-26 LAB — PHOSPHORUS: Phosphorus: 3.1 mg/dL (ref 2.3–4.6)

## 2013-02-26 LAB — HEMOGLOBIN A1C
Hgb A1c MFr Bld: 5.7 % — ABNORMAL HIGH (ref <5.7)
Mean Plasma Glucose: 117 mg/dL — ABNORMAL HIGH (ref <117)

## 2013-02-26 LAB — TSH: TSH: 2.789 u[IU]/mL (ref 0.350–4.500)

## 2013-02-26 LAB — COMPREHENSIVE METABOLIC PANEL
ALK PHOS: 70 U/L (ref 39–117)
ALT: 21 U/L (ref 0–53)
AST: 23 U/L (ref 0–37)
Albumin: 3.5 g/dL (ref 3.5–5.2)
BILIRUBIN TOTAL: 0.7 mg/dL (ref 0.3–1.2)
BUN: 18 mg/dL (ref 6–23)
CALCIUM: 8.9 mg/dL (ref 8.4–10.5)
CO2: 23 meq/L (ref 19–32)
Chloride: 106 mEq/L (ref 96–112)
Creatinine, Ser: 0.84 mg/dL (ref 0.50–1.35)
GFR, EST NON AFRICAN AMERICAN: 79 mL/min — AB (ref >90)
GLUCOSE: 98 mg/dL (ref 70–99)
Potassium: 3.6 mEq/L — ABNORMAL LOW (ref 3.7–5.3)
SODIUM: 142 meq/L (ref 137–147)
Total Protein: 6.4 g/dL (ref 6.0–8.3)

## 2013-02-26 LAB — HEPARIN LEVEL (UNFRACTIONATED): Heparin Unfractionated: 0.54 IU/mL (ref 0.30–0.70)

## 2013-02-26 LAB — PROTIME-INR
INR: 1.21 (ref 0.00–1.49)
Prothrombin Time: 15 seconds (ref 11.6–15.2)

## 2013-02-26 LAB — LIPID PANEL
CHOLESTEROL: 155 mg/dL (ref 0–200)
HDL: 37 mg/dL — ABNORMAL LOW (ref >39)
LDL Cholesterol: 102 mg/dL — ABNORMAL HIGH (ref 0–99)
TRIGLYCERIDES: 78 mg/dL (ref <150)
Total CHOL/HDL Ratio: 4.2 RATIO
VLDL: 16 mg/dL (ref 0–40)

## 2013-02-26 LAB — TROPONIN I
Troponin I: <0.3 ng/mL (ref <0.30)
Troponin I: <0.3 ng/mL (ref <0.30)

## 2013-02-26 LAB — POCT I-STAT TROPONIN I: TROPONIN I, POC: 0.1 ng/mL — AB (ref 0.00–0.08)

## 2013-02-26 LAB — MAGNESIUM: Magnesium: 1.9 mg/dL (ref 1.5–2.5)

## 2013-02-26 SURGERY — LEFT HEART CATHETERIZATION WITH CORONARY ANGIOGRAM
Anesthesia: LOCAL

## 2013-02-26 MED ORDER — DOCUSATE SODIUM 100 MG PO CAPS
100.0000 mg | ORAL_CAPSULE | Freq: Two times a day (BID) | ORAL | Status: DC
  Filled 2013-02-26 (×8): qty 1

## 2013-02-26 MED ORDER — SODIUM CHLORIDE 0.9 % IJ SOLN: 3.0000 mL | Freq: Two times a day (BID) | INTRAMUSCULAR | Status: DC

## 2013-02-26 MED ORDER — MIDAZOLAM HCL 2 MG/2ML IJ SOLN
INTRAMUSCULAR | Status: AC
  Filled 2013-02-26: qty 2

## 2013-02-26 MED ORDER — ONDANSETRON HCL 4 MG/2ML IJ SOLN: 4.0000 mg | Freq: Four times a day (QID) | INTRAMUSCULAR | Status: DC | PRN

## 2013-02-26 MED ORDER — HEPARIN (PORCINE) IN NACL 100-0.45 UNIT/ML-% IJ SOLN
1100.0000 [IU]/h | INTRAMUSCULAR | Status: AC
  Administered 2013-02-26: 1100 [IU]/h via INTRAVENOUS
  Filled 2013-02-26 (×2): qty 250

## 2013-02-26 MED ORDER — ACETAMINOPHEN 650 MG RE SUPP: 650.0000 mg | Freq: Four times a day (QID) | RECTAL | Status: DC | PRN

## 2013-02-26 MED ORDER — SODIUM CHLORIDE 0.9 % IV SOLN: 250.0000 mL | INTRAVENOUS | Status: DC | PRN

## 2013-02-26 MED ORDER — SODIUM CHLORIDE 0.9 % IV SOLN: 1.0000 mL/kg/h | INTRAVENOUS | Status: AC

## 2013-02-26 MED ORDER — SODIUM CHLORIDE 0.9 % IJ SOLN: 3.0000 mL | INTRAMUSCULAR | Status: DC | PRN

## 2013-02-26 MED ORDER — HYDROCODONE-ACETAMINOPHEN 5-325 MG PO TABS: 1.0000 | ORAL_TABLET | ORAL | Status: DC | PRN

## 2013-02-26 MED ORDER — ACETAMINOPHEN 325 MG PO TABS: 650.0000 mg | ORAL_TABLET | Freq: Four times a day (QID) | ORAL | Status: DC | PRN

## 2013-02-26 MED ORDER — VERAPAMIL HCL 2.5 MG/ML IV SOLN
INTRAVENOUS | Status: AC
Start: 2013-02-26 — End: 2013-02-26
  Filled 2013-02-26: qty 2

## 2013-02-26 MED ORDER — HEPARIN BOLUS VIA INFUSION
4000.0000 [IU] | Freq: Once | INTRAVENOUS | Status: AC
  Administered 2013-02-26: 4000 [IU] via INTRAVENOUS
  Filled 2013-02-26: qty 4000

## 2013-02-26 MED ORDER — HEPARIN (PORCINE) IN NACL 2-0.9 UNIT/ML-% IJ SOLN
INTRAMUSCULAR | Status: AC
  Filled 2013-02-26: qty 1500

## 2013-02-26 MED ORDER — HEPARIN (PORCINE) IN NACL 100-0.45 UNIT/ML-% IJ SOLN
1100.0000 [IU]/h | INTRAMUSCULAR | Status: DC
  Administered 2013-02-26: 1100 [IU]/h via INTRAVENOUS
  Filled 2013-02-26 (×2): qty 250

## 2013-02-26 MED ORDER — LIDOCAINE HCL (PF) 1 % IJ SOLN
INTRAMUSCULAR | Status: AC
  Filled 2013-02-26: qty 30

## 2013-02-26 MED ORDER — LEVOTHYROXINE SODIUM 50 MCG PO TABS
50.0000 ug | ORAL_TABLET | Freq: Every day | ORAL | Status: DC
  Administered 2013-02-26 – 2013-02-27 (×2): 50 ug via ORAL
  Filled 2013-02-26 (×5): qty 1

## 2013-02-26 MED ORDER — ASPIRIN 325 MG PO TABS: 325.0000 mg | ORAL_TABLET | Freq: Every day | ORAL | Status: DC

## 2013-02-26 MED ORDER — METOPROLOL TARTRATE 50 MG PO TABS
50.0000 mg | ORAL_TABLET | Freq: Two times a day (BID) | ORAL | Status: DC
  Administered 2013-02-26 – 2013-02-27 (×3): 50 mg via ORAL
  Filled 2013-02-26 (×4): qty 1
  Filled 2013-02-26: qty 2
  Filled 2013-02-26 (×3): qty 1

## 2013-02-26 MED ORDER — ASPIRIN 325 MG PO TABS
325.0000 mg | ORAL_TABLET | Freq: Every day | ORAL | Status: DC
  Administered 2013-02-27: 325 mg via ORAL
  Filled 2013-02-26 (×3): qty 1

## 2013-02-26 MED ORDER — ASPIRIN 81 MG PO CHEW
81.0000 mg | CHEWABLE_TABLET | ORAL | Status: AC
  Administered 2013-02-26: 81 mg via ORAL
  Filled 2013-02-26: qty 1

## 2013-02-26 MED ORDER — SODIUM CHLORIDE 0.9 % IV SOLN: 1.0000 mL/kg/h | INTRAVENOUS | Status: DC

## 2013-02-26 MED ORDER — ASPIRIN 325 MG PO TABS
325.0000 mg | ORAL_TABLET | Freq: Every day | ORAL | Status: DC
  Filled 2013-02-26: qty 1

## 2013-02-26 MED ORDER — ASPIRIN 81 MG PO CHEW
81.0000 mg | CHEWABLE_TABLET | ORAL | Status: DC
  Filled 2013-02-26: qty 1

## 2013-02-26 MED ORDER — NITROGLYCERIN 0.2 MG/ML ON CALL CATH LAB
INTRAVENOUS | Status: AC
  Filled 2013-02-26: qty 1

## 2013-02-26 MED ORDER — HEPARIN SODIUM (PORCINE) 1000 UNIT/ML IJ SOLN
INTRAMUSCULAR | Status: AC
  Filled 2013-02-26: qty 1

## 2013-02-26 MED ORDER — ONDANSETRON HCL 4 MG PO TABS: 4.0000 mg | ORAL_TABLET | Freq: Four times a day (QID) | ORAL | Status: DC | PRN

## 2013-02-26 MED ORDER — SODIUM CHLORIDE 0.9 % IV SOLN
250.0000 mL | INTRAVENOUS | Status: DC | PRN
Start: 2013-02-26 — End: 2013-02-26

## 2013-02-26 MED ORDER — FENTANYL CITRATE 0.05 MG/ML IJ SOLN
INTRAMUSCULAR | Status: AC
  Filled 2013-02-26: qty 2

## 2013-02-26 NOTE — Progress Notes (Signed)
Patients belongings given to [employer]) to transport over to [facility].

## 2013-02-26 NOTE — Progress Notes (Signed)
Patient ID: Luis Buba., male   DOB: 1927/10/13, 80 y.o.   MRN: [id number]    SUBJECTIVE: No further chest pain, no complaints this morning.   [name] aspirin  325 mg Oral Daily  . docusate sodium  100 mg Oral BID  . levothyroxine  50 mcg Oral QAC breakfast  . metoprolol  50 mg Oral BID  . sodium chloride  3 mL Intravenous Q12H  . sodium chloride  3 mL Intravenous Q12H  heparin gtt    Filed Vitals:   02/25/13 1929 02/26/13 0254 02/26/13 0532  BP: 156/91 136/88 127/83  Pulse:  74 108  Temp: 98.1 F (36.7 C) 98.6 F (37 C) 98.1 F (36.7 C)  TempSrc: Oral Oral Oral  Resp: 15 16 16   Height: 5\' 9"  (1.753 m) 5\' 9"  (1.753 m)   Weight: 85.73 kg (189 lb) 86.592 kg (190 lb 14.4 oz)   SpO2: 99% 97% 96%    Intake/Output Summary (Last 24 hours) at 02/26/13 0752 Last data filed at 02/26/13 0700  Gross per 24 hour  Intake      0 ml  Output    375 ml  Net   -375 ml    LABS: Basic Metabolic Panel:  Recent Labs  02/25/13 2133 02/26/13 0521  NA 142 142  K 3.9 3.6*  CL 107 106  CO2 24 23  GLUCOSE 103* 98  BUN 23 18  CREATININE 0.90 0.84  CALCIUM 9.1 8.9  MG  --  1.9  PHOS  --  3.1   Liver Function Tests:  Recent Labs  02/25/13 2133 02/26/13 0521  AST 27 23  ALT 23 21  ALKPHOS 68 70  BILITOT 0.4 0.7  PROT 6.5 6.4  ALBUMIN 3.4* 3.5   No results found for this basename: LIPASE, AMYLASE,  in the last 72 hours CBC:  Recent Labs  02/25/13 2133 02/26/13 0521  WBC 5.6 5.6  HGB 15.1 15.0  HCT 42.0 42.9  MCV 88.2 89.0  PLT 160 159   Cardiac Enzymes:  Recent Labs  02/26/13 0521  TROPONINI <0.30   BNP: No components found with this basename: POCBNP,  D-Dimer: No results found for this basename: DDIMER,  in the last 72 hours Hemoglobin A1C: No results found for this basename: HGBA1C,  in the last 72 hours Fasting Lipid Panel: No results found for this basename: CHOL, HDL, LDLCALC, TRIG, CHOLHDL, LDLDIRECT,  in the last 72 hours Thyroid Function  Tests: No results found for this basename: TSH, T4TOTAL, FREET3, T3FREE, THYROIDAB,  in the last 72 hours Anemia Panel: No results found for this basename: VITAMINB12, FOLATE, FERRITIN, TIBC, IRON, RETICCTPCT,  in the last 72 hours  RADIOLOGY: Dg Chest Portable 1 View  02/25/2013   CLINICAL DATA:  Chest pain  EXAM: PORTABLE CHEST - 1 VIEW  COMPARISON:  DG CHEST 2 VIEW dated 05/25/2012; DG CHEST 2 VIEW dated 03/03/2012; DG CHEST 2 VIEW dated 12/06/2010  FINDINGS: There is stable mild cardiomegaly. Atherosclerotic calcification thoracic aortic arch is noted. Lung volumes are slightly low. Interstitial markings are mildly prominent, and unchanged compared to prior studies. Negative for edema, focal airspace disease, or visible pleural effusion. Negative for pneumothorax. A large hiatal hernia contour is similar to priors. Remote healed left clavicle fracture noted. No acute osseous abnormality.  IMPRESSION: 1. Low lung volumes with chronic mild interstitial prominence. No definite acute findings. 2. Stable cardiomegaly. 3. Stable large hiatal hernia. 4. Atherosclerosis.   Electronically Signed   By: Manuela Schwartz  Turner M.D.   On: 02/25/2013 22:06    PHYSICAL EXAM General: NAD Neck: No JVD, no thyromegaly or thyroid nodule.  Lungs: Clear to auscultation bilaterally with normal respiratory effort. CV: Nondisplaced PMI.  Heart regular S1/S2, no S3/S4, no murmur.  No peripheral edema.  No carotid bruit.  Normal pedal pulses.  Abdomen: Soft, nontender, no hepatosplenomegaly, no distention.  Neurologic: Alert and oriented x 3.  Psych: Normal affect. Extremities: No clubbing or cyanosis.   TELEMETRY: Reviewed telemetry pt in NSR  ASSESSMENT AND PLAN: 80 yo with history of chronic atrial fibrillation and HTN presented with unstable angina.  1. CAD: Patient has symptoms that are quite concerning for unstable angina.  He had one POC TnI that was 0.1, other next TnI was < 0.3.  I am not sure that this is truly an  NSTEMI but pre-test probability high based on unstable exertional symptoms that he has an unstable plaque.  - Continue ASA, metoprolol, heparin gtt - Add statin - LHC today (discussed with patient).  2. Chronic atrial fibrillation: Has refused coumadin in the past.  Scared of NOACs because of class action adds on TV.  Would reconsider but needs to think about it. I did discuss stroke risk with him.  Anticoagulation will be a bit complicated if he needs a stent.  For now, will not be anticoagulated.  Continue metoprolol.   Loralie Champagne 02/26/2013 7:56 AM

## 2013-02-26 NOTE — Consult Note (Signed)
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  The patient apparently was first told he had atrial fibrillation many years ago by Dr. Joni Fears. Patient has always resisted anticoagulation treatment with Coumadin for a variety of reasons including a history of frequent falls.  In 2012 the patient was hospitalized with along with rapid atrial fibrillation. At that time the patient had left shoulder pain but was not aware of irregular heart rhythms.  The patient was seen this past October for atrial fibrillation by Dr. Harrington Challenger. Other than feeling chronic fatigue with intermittent left shoulder pain, the patient did not seem to be symptomatic. The patient remained reluctant to consider long-term anticoagulation therapy to decrease his risk of stroke.  The patient has remained reasonably active physically for a gentleman his age although he does walk using a cane and still has problems with balance. This past weekend he was performing more strenuous exertion while he was working around the house, lifting and unpacking boxes. He develop chest discomfort described as tightness and heaviness radiating across his chest to left shoulder. Symptoms improved with rest. Over the next 48 hours the patient had recurrent symptoms with exertion, ultimately prompting him to be admitted to [facility]. The patient ruled out for acute myocardial infarction based on serial cardiac enzymes.   He has not had any recurrent chest pain since admission. He underwent diagnostic cardiac catheterization earlier today by Dr. Ellyn Hack demonstrating the presence of severe three-vessel coronary artery disease with preserved left ventricular systolic function. Cardiothoracic surgical consultation was requested.  Past Medical History  Diagnosis Date  . Personal history of colonic polyps     tubular adenoma  . Hypertension   . Irritable bowel syndrome   . Prostate cancer 1991    sees Dr. Carlota [name] at [place], observation only   . Paroxysmal atrial fibrillation   . Atrial fibrillation     Past Surgical History  Procedure Laterality Date  . Hemorrhoid surgery    . Prostate biopsy    . Colonoscopy  2011    per Dr. Sharlett Iles, benign polyps, no repeats needed   . Cataract extraction, bilateral  2014    per Dr. Gershon Crane     Family History  Problem Relation Age of Onset  . Diabetes    . Hypertension    . Lung cancer Brother   . Sudden death    . Heart disease    . Stroke    . Heart disease Son   . Stomach cancer Brother     History   Social History  . Marital Status: Married    Spouse Name: N/A    Number of Children: 3  . Years of Education: N/A   Occupational History  . Retired    Social History Main Topics  . Smoking status: Never Smoker   . Smokeless tobacco: Never Used  . Alcohol Use: No  .  Drug Use: No  . Sexual Activity: No   Other Topics Concern  . Not on file   Social History Narrative  . No narrative on file    Prior to Admission medications   Medication Sig Start Date End Date Taking? Authorizing Provider  aspirin 325 MG tablet Take 325 mg by mouth daily.    Yes Historical Provider, MD  Cholecalciferol (VITAMIN D) 1000 UNITS capsule Take 2,000 Units by mouth every morning.    Yes Historical Provider, MD  fish oil-omega-3 fatty acids 1000 MG capsule Take 1 g by mouth every morning.    Yes Historical Provider, MD  ibuprofen (ADVIL,MOTRIN) 200 MG tablet  Take 200 mg by mouth every 6 (six) hours as needed for pain (pain).   Yes Historical Provider, MD  levothyroxine (SYNTHROID, LEVOTHROID) 50 MCG tablet Take 1 tablet (50 mcg total) by mouth daily. 09/25/12  Yes [name], MD  metoprolol (LOPRESSOR) 50 MG tablet Take 1 tablet (50 mg total) by mouth 2 (two) times daily. 09/25/12  Yes [name], MD  vitamin C (ASCORBIC ACID) 500 MG tablet Take 500 mg by mouth every morning.    Yes Historical Provider, MD    Current Facility-Administered Medications  Medication Dose Route Frequency Provider Last Rate Last Dose  . 0.9 %  sodium chloride infusion  250 mL Intravenous PRN Leonie Man, MD      . acetaminophen (TYLENOL) tablet 650 mg  650 mg Oral Q6H PRN Toy Baker, MD       Or  . acetaminophen (TYLENOL) suppository 650 mg  650 mg Rectal Q6H PRN Toy Baker, MD      . Derrill Memo ON 02/27/2013] aspirin tablet 325 mg  325 mg Oral Daily Toy Baker, MD      . docusate sodium (COLACE) capsule 100 mg  100 mg Oral BID Toy Baker, MD      . heparin ADULT infusion 100 units/mL (25000 units/250 mL)  1,100 Units/hr Intravenous Continuous Rexene Alberts, MD      . HYDROcodone-acetaminophen (NORCO/VICODIN) 5-325 MG per tablet 1-2 tablet  1-2 tablet Oral Q4H PRN Toy Baker, MD      . levothyroxine (SYNTHROID, LEVOTHROID) tablet 50 mcg  50 mcg Oral QAC breakfast Toy Baker, MD   50 mcg at 02/26/13 0733  . metoprolol (LOPRESSOR) tablet 50 mg  50 mg Oral BID Toy Baker, MD      . ondansetron (ZOFRAN) tablet 4 mg  4 mg Oral Q6H PRN Toy Baker, MD       Or  . ondansetron (ZOFRAN) injection 4 mg  4 mg Intravenous Q6H PRN Toy Baker, MD      . sodium chloride 0.9 % injection 3 mL  3 mL Intravenous Q12H Leonie Man, MD      . sodium chloride 0.9 % injection 3 mL  3 mL Intravenous PRN Leonie Man, MD        No Known Allergies    Review of Systems:   General:  normal appetite, decreased  energy, no weight gain, no weight loss, no fever  Cardiac:  + chest pain with exertion, no chest pain at rest, no SOB with exertion, no resting SOB, no PND, no orthopnea, + palpitations, + arrhythmia, + atrial fibrillation, no LE edema, + dizzy spells, + syncope in the past but not recently  Respiratory:  no shortness of breath, no home oxygen, no productive cough, + dry cough, no bronchitis, no wheezing, no hemoptysis, no asthma,  no pain with inspiration or cough, no sleep apnea, no CPAP at night  GI:   no difficulty swallowing, no reflux, no frequent heartburn, no hiatal hernia, no abdominal pain, no constipation, + diarrhea with h/o IBS, no hematochezia, no hematemesis, no melena  GU:   no dysuria,  no frequency, no urinary tract infection, no hematuria, + enlarged prostate with prostate cancer being treated conservatively, no kidney stones, no kidney disease  Vascular:  no pain suggestive of claudication, no pain in feet, no leg cramps, no varicose veins, no DVT, no non-healing foot ulcer  Neuro:   No known stroke, no TIA's, no seizures, no headaches, no temporary blindness one eye,  no slurred speech, no peripheral neuropathy, no chronic pain, + some instability of gait, + mild memory/cognitive dysfunction  Musculoskeletal: no arthritis, no joint swelling, no myalgias, mild difficulty walking due to balance, fairly normal mobility   Skin:   no rash, no itching, no skin infections, no pressure sores or ulcerations  Psych:   no anxiety, no depression, no nervousness, no unusual recent stress  Eyes:   no blurry vision, no floaters, no recent vision changes, + wears glasses or contacts  ENT:   no hearing loss, no loose or painful teeth, no dentures, last saw dentist   Hematologic:  no easy bruising, no abnormal bleeding, no clotting disorder, no frequent epistaxis  Endocrine:  no diabetes, does not check CBG's at home     Physical Exam:   BP 157/74  Pulse 77  Temp(Src) 98.4 F (36.9 C) (Oral)   Resp 17  Ht _0  (1.753 m)  Wt 84 kg (185 lb 3 oz)  BMI 27.33 kg/m2  SpO2 100%  General:  Elderly but  well-appearing  HEENT:  Unremarkable   Neck:   no JVD, no bruits, no adenopathy   Chest:   clear to auscultation, symmetrical breath sounds, no wheezes, no rhonchi   CV:   Irregular rate and rhythm, no murmur   Abdomen:  soft, non-tender, no masses   Extremities:  warm, well-perfused, pulses diminished but palpable, no edema  Rectal/GU  Deferred  Neuro:   Grossly non-focal and symmetrical throughout  Skin:   Clean and dry, no rashes, no breakdown  Diagnostic Tests:  Echocardiography  Patient: Willett, Lefeber MR #: [id number] Study Date: 12/01/2012 Gender: M Age: 66 Height: 175.3cm Weight: 84.8kg BSA: 2.49m2 Pt. Status: Room:  ATTENDING RJacobo Forest [address]REFERRING FAlysia PennaA PERFORMING MZacarias Pontes [address] SONOGRAPHER WCoralyn Hellingcc:  ------------------------------------------------------------ LV EF: 60% - 65%  ------------------------------------------------------------ Indications: Atrial fibrillation - chronic 427.31.  ------------------------------------------------------------ History: PMH: Acquired from the patient and from the patient's chart. Atrial fibrillation. Atrial fibrillation. Coronary artery disease. Risk factors: Lifelong nonsmoker. Hypertension. Diabetes mellitus.  ------------------------------------------------------------ Study Conclusions  - Left ventricle: Systolic function was normal. The estimated ejection fraction was in the range of 60% to 65%. Wall motion was normal; there were no regional wall motion abnormalities. - Aortic valve: Mild to moderate regurgitation. - Mitral valve: Prolapse. Mild regurgitation. - Left atrium: The atrium was mildly to moderately dilated. - Right ventricle: The cavity size was mildly dilated. - Right atrium: The atrium was mildly dilated. Echocardiography. M-mode, complete 2D,  spectral Doppler, and color Doppler. Height: Height: 175.3cm. Height: 69in. Weight: Weight: 84.8kg. Weight: 186.6lb. Body mass index: BMI: 27.6kg/m^2. Body surface area: BSA: 2.022m. Blood pressure: 105/73. Patient status: Outpatient.  ------------------------------------------------------------  ------------------------------------------------------------ Left ventricle: Systolic function was normal. The estimated ejection fraction was  in the range of 60% to 65%. Wall motion was normal; there were no regional wall motion abnormalities. Early diastolic septal annular tissue Doppler velocities Ea were abnormal.  ------------------------------------------------------------ Aortic valve: Doppler: There was no stenosis. Mild to moderate regurgitation.  ------------------------------------------------------------ Aorta: Aortic root: The aortic root was normal in size. Ascending aorta: The ascending aorta was mildly dilated.  ------------------------------------------------------------ Mitral valve: There is mild mitral valve prolapse of the posterior leaflet. Prolapse. Doppler: Mild regurgitation. Peak gradient: 89mm Hg (D).  ------------------------------------------------------------ Left atrium: The atrium was mildly to moderately dilated.  ------------------------------------------------------------ Right ventricle: The cavity size was mildly dilated. Systolic function was normal.  ------------------------------------------------------------ Pulmonic valve: Structurally normal valve. Cusp separation was normal. Doppler: Transvalvular velocity was within the normal range. Trivial regurgitation.  ------------------------------------------------------------ Tricuspid valve: Doppler: Trivial regurgitation.  ------------------------------------------------------------ Pulmonary artery: Systolic pressure was within the  normal range.  ------------------------------------------------------------ Right atrium: The atrium was mildly dilated.  ------------------------------------------------------------ Pericardium: There was no pericardial effusion.  ------------------------------------------------------------ Systemic veins: Inferior vena cava: The vessel was normal in size; the respirophasic diameter changes were in the normal range (= 50%); findings are consistent with normal central venous pressure.  ------------------------------------------------------------  2D measurements Normal Doppler measurements Normal Left ventricle Main pulmonary LVID ED, 45.9 mm 43-52 artery chord, Pressure, 20 mm Hg =30 PLAX S LVID ES, 32 mm 23-38 Left ventricle chord, Ea, lat 10.95 cm/s ------ PLAX ann, tiss FS, chord, 30 % >29 DP PLAX E/Ea, lat 12.15 ------ LVPW, ED 10.8 mm ------ ann, tiss IVS/LVPW 1.19 <1.3 DP ratio, ED Ea, med 6.11 cm/s ------ Ventricular septum ann, tiss IVS, ED 12.9 mm ------ DP LVOT E/Ea, med 21.77 ------ Diam, S 23 mm ------ ann, tiss Area 4.15 cm^2 ------ DP Diam 23 mm ------ LVOT Aorta Peak vel, 66.4 cm/s ------ Root diam, 33 mm ------ S ED VTI, S 13.5 cm ------ AAo AP 41 mm ------ Stroke vol 56.1 ml ------ diam, S Stroke 27.9 ml/m^ ------ Left atrium index 2 AP dim 46 mm ------ Aortic valve AP dim 2.29 cm/m^2 <2.2 Regurg PHT 674 ms ------ index Mitral valve Peak E vel 133 cm/s ------ Decelerati 216 ms 150-23 on time 0 Peak 7 mm Hg ------ gradient, D Tricuspid valve Regurg 196 cm/s ------ peak vel Peak RV-RA 15 mm Hg ------ gradient, S Systemic veins Estimated 5 mm Hg ------ CVP Right ventricle Pressure, 20 mm Hg <30 S Sa vel, 9.35 cm/s ------ lat ann, tiss DP  ------------------------------------------------------------ Prepared and Electronically Authenticated by  Mertie Moores 2014-10-10T16:23:[id number]        CARDIAC CATHETERIZATION REPORT    NAME: [name]. MRN: [id number]  DOB: Jun 25, 1927 ADMIT DATE: 02/25/2013  Procedure Date: 02/26/2013  INTERVENTIONAL CARDIOLOGIST: Leonie Man, M.D., MS  PRIMARY CARE PROVIDER: [name], MD  PRIMARY CARDIOLOGIST: Loralie Champagne, MD  PATIENT: [name]. is a 80 y.o. male with PMH of atrial fibrillation and hypertension as well as prostate cancer who follows with Dr. Harrington Challenger for atrial fibrillation who has noted chest pain over the last several days while performing heavier work such as lifting and unpacking. The chest pain is heavy and tight and associated with some radiation to the left shoulder. The pain improves with rest and has lasted as long as 20 minutes. He kept working, having CP, stopping to rest and repeating ultimately coming in to be evalauted. He was initially seen and admitted at [facility] and his troponin was noted to be mildly elevated with symptoms concerning for  unstable angina so cardiology consulted, heparin gtt initiated and patient made NPO for potential LHC. We discussed the plan for likely LHC and the bed shortage at [facility].  PRE-OPERATIVE DIAGNOSIS:  UNSTABLE ANGINA  CLASS III-IV ANGINA  CHRONIC ATRIAL FIBRILLATION PROCEDURES PERFORMED:  DIFFICULT LEFT HEART CATHETERIZATION WITH CORONARY ANGIOGRAPHY  PROCEDURE:Consent: Risks of procedure as well as the alternatives and risks of each were explained to the (patient/caregiver). Consent for procedure obtained.  Consent for signed by MD and patient with RN witness -- placed on chart.  PROCEDURE: The patient was brought to the 2nd [facility] in the fasting state and prepped and draped in the usual sterile fashion for Right groin or radial access. A modified Allen's test with plethysmography was performed, revealing excellent Ulnar artery collateral flow. Sterile technique was used including antiseptics, cap, gloves, gown, hand hygiene, mask and sheet. Skin prep:  Chlorhexidine.  Time Out: Verified patient identification, verified procedure, site/side was marked, verified correct patient position, special equipment/implants available, medications/allergies/relevent history reviewed, required imaging and test results available. Performed  Access:  Right Radial Artery; 6 Fr Sheath -- Seldinger technique (Angiocath Micropuncture Kit) IA Radial Cocktail, IV Heparin  After multiple unsuccessful attempts were made via the radial access to engage either the right or left coronary artery due to significant subclavian artery tortuosity, the decision was made to convert to femoral access.  Right Common Femoral: 5 French Sheath -- fluoroscopically guided, modified Seldinger technique Diagnostic Left Heart Catheterization:  Radial Attempt: 5 French JACKI 4.0, JR4, EBU 3.5 - catheters advanced & exchanged over long exchange safety-J wire  The patient's right subclavian artery was extremely tortuous with several bands making catheter torquing nearly impossible. The wire was used for catheter support, despite this I was still unable to engage either the right or left coronary artery.  An essentials kit was used to allow the use of wire for catheter support, but unable to engage either the RCA or LCA.  The decision was made to convert to femoral artery access. Femoral Access Attempt: 5 French catheters advanced and exchanged over standard J-wire  Right Coronary Artery Angiography: First a JR 4 catheter followed by a No Torque catheter was used to engage the RCA  Left Coronary Artery Angiography: EBU 3.5 Guide  LV Hemodynamics (LV Gram hand-injection ): JR 4, after unsuccessful attempts at crossing the catheter, the JR 4 catheter was used. The need to switch from radial to femoral access due to tortuous subclavian artery, as well as very difficult to engage native coronary arteries regardless of the approach makes is a very difficult procedure.  TR Band: 1220 Hours, 13 mL air   Femoral Sheath: To be removed in the holding area with manual pressure  MEDICATIONS:  Anesthesia: Local Lidocaine 2 ml at right wrist; 18 right groin  Sedation: 2 mg IV Versed, 50 mcg IV fentanyl ;  Omnipaque Contrast: 180 ml  Anticoagulation: IV Heparin 4500 Units ;  Radial Cocktail: 5 mg Verapamil, 400 mcg NTG, 2 ml 2% Lidocaine in 10 ml NS Hemodynamics:  Central Aortic / Mean Pressures: 129/73 mmHg; 99 mmHg  Left Ventricular Pressures / EDP: 127/12 mmHg; 19 mmHg Left Ventriculography:  EF: Likely 55-60 %  Wall Motion: Possible inferior hypokinesis Coronary Anatomy:  Left Main: Heavily calcified, large-caliber vessel that bifurcates into the LAD, and Circumflex LAD: Large-caliber vessel that gives off 2 relatively proximal diagonal branches (D1, D2) both of which have ostial lesions. The proximal portion of the LAD  is heavily calcified with the terminal portion of the calcified segment having and 70-90% irregular stenosis just prior to the third diagonal branch (D3). There are septal to RPDA collaterals filling a portion of the PDA.  D1: Moderate caliber vessel with ostial 70-80% stenosis. This vessel courses along the anterolateral wall and otherwise has diffuse luminal irregularities.  D2: Moderate caliber vessel with proximal subtotal occlusion (95-99%) with downstream flow revealing a significant diagonal branch that bifurcates on the anterolateral wall. There is no significant disease downstream. It is a good target for bypass. Left Circumflex: Large-caliber vessel that takes a tortuous takeoff from the left main. There is a proximal subtotal occlusion (95-99%) just prior to the bifurcation into a branching, large caliber Lateral OM 1 and the AV groove circumflex that terminates as a left posterior lateral branch.  OM1: Large-caliber lateral vessel that takes off just after the significant circumflex lesion. In the early proximal segment there is a inferior branch there is a 100% occluded  that takes off from a roughly 70% stenosis. Beyond this the vessel bifurcates into moderate to large caliber superior to inferior branches. The larger superior branch has an ostial/proximal 90% stenosis followed by mesna disease in a tortuous vessel. The inferior branch is relatively free of disease.  The AV groove circumflex terminates as a Left Posterolateral Branch and gives collaterals to what appears to be the distal Right Posterolateral system. Diffuse mild luminal irregularities noted.  RCA: likely large caliber vessel that is 100% flush occluded with the SA nodal and atrial branch visible.. The atrial branch provides collaterals to the distal right coronary artery.  PATIENT DISPOSITION:  The patient was transferred to the PACU holding area in a hemodynamicaly stable, chest pain free condition.  The patient tolerated the procedure well, and there were no complications. EBL: < 15 ml  The patient was stable before, during, and after the procedure. POST-OPERATIVE DIAGNOSIS:  Severe multivessel CAD including a 100% ostial RCA and proximal inferior branch of OM 1, subtotal occlusion of proximal D2 and proximal potentially codominant Circumflex, ostial 90% superior branch of lateral OM as well as D1, mid LAD 70-90% stenosis.  Relatively preserved LVEF in a vertical heart making wall motion difficult to determine..  Moderately elevated LVEDP. PLAN OF CARE:  The patient will be transferred from [facility] to the [place] (2 H). He'll be transferred to the [facility] service currently being covered by Dr. Liam Rogers who is aware the patient's condition.  CVTS consultation has been initiated for multivessel CABG  Restart IV heparin as appropriate post catheterization.  Continue medication adjustment per primary service. Leonie Man, M.D., M.S.  [facility]  [address] [place], [place] [address]  [phone]  02/26/2013  1:09  PM     Impression:  The patient has severe three-vessel coronary artery disease with preserved left ventricular systolic function and recent onset symptoms of angina pectoris. The patient also has chronic persistent atrial fibrillation, and the patient has remained resistant to the idea of long-term anticoagulation to decrease his risk of stroke for a variety of reasons including history of multiple falls in the past. Although risks associated with surgery will be somewhat elevated because of the patient's advanced age, I agree that he would best be treated with surgical revascularization. It might be reasonable to consider concomitant Maze procedure, although I suspect this will come with significant potential for the development of bradycardia postoperatively, potentially requiring permanent pacemaker  placement.    Plan:  I have reviewed the indications, risks, and potential benefits of coronary artery bypass grafting with the patient and his family.  Alternative treatment strategies have been discussed.  The patient understands and accepts all potential associated risks of surgery including but not limited to risk of death, stroke or other neurologic complication, myocardial infarction, congestive heart failure, respiratory failure, renal failure, bleeding requiring blood transfusion and/or reexploration, aortic dissection or other major vascular complication, arrhythmia, heart block or bradycardia requiring permanent pacemaker, pneumonia, pleural effusion, wound infection, pulmonary embolus or other thromboembolic complication, chronic pain or other delayed complications related to median sternotomy, or the late recurrence of symptomatic ischemic heart disease and/or congestive heart failure.  The importance of long term risk modification have been emphasized.  We have also discussed the relative risks and benefits of concomitant Maze procedure. I've mentioned my concern that the patient may be at  somewhat increased risk for perioperative stroke, postoperative delirium, and/or problems with returning to normal physical mobility such that he may be at increased risk for the need of inpatient or outpatient rehabilitation during his recovery.  All questions have been answered. We tentatively plan to proceed with surgery on Wednesday, January 7.      Valentina Gu. Roxy Manns, MD 02/26/2013 8:39 PM  I spent in excess of 90 minutes of time directly involved in the conduct of this consultation.

## 2013-02-26 NOTE — Interval H&P Note (Signed)
History and Physical Interval Note:  02/26/2013 11:09 AM  [name].  has presented today for surgery, with the diagnosis of chest pain concerning for unstable/crescendo angina. He noted chest pressure and dyspnea with routine activity yesterday. He has not had any chest pain since admission to [facility]. He was seen and evaluated this morning by Dr. Marigene Ehlers, who has referred him for invasive cardiac evaluation for a possible unstable angina - that is more consistent with crescendo class 3-4 angina..   The various methods of treatment have been discussed with the patient and family. After consideration of risks, benefits and other options for treatment, the patient has consented to  Procedure(s): LEFT HEART CATHETERIZATION WITH CORONARY ANGIOGRAM (N/A) as a surgical intervention .  The patient's history has been reviewed, patient examined, no change in status, stable for surgery.  I have reviewed the patient's chart and labs.  Questions were answered to the patient's satisfaction.     HARDING,DAVID W  Cath Lab Visit (complete for each Cath Lab visit)  Clinical Evaluation Leading to the Procedure:   ACS: yes  Non-ACS:    Anginal Classification: CCS III  Anti-ischemic medical therapy: Minimal Therapy (1 class of medications)  Non-Invasive Test Results: No non-invasive testing performed  Prior CABG: No previous CABG

## 2013-02-26 NOTE — Progress Notes (Signed)
ANTICOAGULATION CONSULT NOTE - Follow Up Consult  Pharmacy Consult for Heparin Indication: chest pain/ACS  No Known Allergies  Patient Measurements: Height: 5\' 9"  (175.3 cm) Weight: 185 lb 3 oz (84 kg) IBW/kg (Calculated) : 70.7 Heparin Dosing Weight: 85.7 kg   Vital Signs: Temp: 97.7 F (36.5 C) (01/05 0852) Temp src: Oral (01/05 0852) BP: 142/85 mmHg (01/05 0852) Pulse Rate: 83 (01/05 0852)  Labs:  Recent Labs  02/25/13 2133 02/26/13 0521 02/26/13 0920  HGB 15.1 15.0 14.6  HCT 42.0 42.9 41.3  PLT 160 159 147*  APTT 27  --   --   LABPROT 14.6  --  15.0  INR 1.16  --  1.21  HEPARINUNFRC  --   --  0.54  CREATININE 0.90 0.84  --   TROPONINI  --  <0.30  --     Estimated Creatinine Clearance: 66.6 ml/min (by C-G formula based on Cr of 0.84).   Medical History: Past Medical History  Diagnosis Date  . Personal history of colonic polyps     tubular adenoma  . Hypertension   . Irritable bowel syndrome   . Prostate cancer 1991    sees Dr. Carlota Raspberry at [place], observation only   . Paroxysmal atrial fibrillation   . Atrial fibrillation     Assessment:  80 yr male with h/o prostate CA, AFib but no known CAD with complaint of chest pain with exertion  IV heparin started early this AM for r/o ACS/STEMI.  Cardiology saw patient this AM and notes that symptoms are concerning for unstable angina, possible NSTEMI.  Continuing heparin and plan is for cath today.  ASA, metoprolol, statin started.  1st Heparin level is therapeutic at 0.54 with infusion at 1100 units/hr.  Goal of Therapy:  Heparin level 0.3-0.7 units/ml Monitor platelets by anticoagulation protocol: Yes   Plan:   Continue heparin infusion @ 1100 units/hr.  Recheck heparin level at 1700.  F/u plan for cath.  Check daily heparin level & CBC while on heparin  Hershal Coria, PharmD 02/26/2013,10:15 AM

## 2013-02-26 NOTE — Progress Notes (Signed)
ANTICOAGULATION CONSULT NOTE - Initial Consult  Pharmacy Consult for Heparin Indication: chest pain/ACS  No Known Allergies  Patient Measurements: Height: 5\' 9"  (175.3 cm) Weight: 189 lb (85.73 kg) IBW/kg (Calculated) : 70.7 Heparin Dosing Weight: 85.7 kg   Vital Signs: Temp: 98.1 F (36.7 C) (01/04 1929) Temp src: Oral (01/04 1929) BP: 156/91 mmHg (01/04 1929)  Labs:  Recent Labs  02/25/13 2133  HGB 15.1  HCT 42.0  PLT 160  APTT 27  LABPROT 14.6  INR 1.16  CREATININE 0.90    Estimated Creatinine Clearance: 67.5 ml/min (by C-G formula based on Cr of 0.9).   Medical History: Past Medical History  Diagnosis Date  . Personal history of colonic polyps     tubular adenoma  . Hypertension   . Irritable bowel syndrome   . Prostate cancer 1991    sees Dr. Carlota Raspberry at [place], observation only   . Paroxysmal atrial fibrillation   . Atrial fibrillation     Medications:  Scheduled:   Infusions:  . sodium chloride 1,000 mL (02/25/13 2129)  . heparin     Followed by  . heparin      Assessment:  80 yr male with h/o prostate CA, AFib but no known CAD with complaint of chest pain with exertion  IV heparin to begin for r/o ACS/STEMI  Goal of Therapy:  Heparin level 0.3-0.7 units/ml Monitor platelets by anticoagulation protocol: Yes   Plan:   Heparin 4000 unit IV bolus x 1 followed by infusion @ 1100 units/hr  Check heparin level 8 hrs after heparin started  Check daily heparin level & CBC while on heparin  Melora Menon, Toribio Harbour, PharmD 02/26/2013,12:47 AM

## 2013-02-26 NOTE — CV Procedure (Signed)
CARDIAC CATHETERIZATION REPORT  NAME:  [name].   MRN: [id number] DOB:  1927/11/05   ADMIT DATE: 02/25/2013 Procedure Date: 02/26/2013  INTERVENTIONAL CARDIOLOGIST: Leonie Man, M.D., MS PRIMARY CARE PROVIDER: [name], MD PRIMARY CARDIOLOGIST: Loralie Champagne, MD  PATIENT:  [name]. is a 80 y.o. male with PMH of atrial fibrillation and hypertension as well as prostate cancer who follows with Dr. Harrington Challenger for atrial fibrillation who has noted chest pain over the last several days while performing heavier work such as lifting and unpacking. The chest pain is heavy and tight and associated with some radiation to the left shoulder. The pain improves with rest and has lasted as long as 20 minutes. He kept working, having CP, stopping to rest and repeating ultimately coming in to be evalauted. He was initially seen and admitted at [facility] and his troponin was noted to be mildly elevated with symptoms concerning for unstable angina so cardiology consulted, heparin gtt initiated and patient made NPO for potential LHC. We discussed the plan for likely LHC and the bed shortage at [facility].  PRE-OPERATIVE DIAGNOSIS:    UNSTABLE ANGINA  CLASS III-IV ANGINA  CHRONIC ATRIAL FIBRILLATION  PROCEDURES PERFORMED:    DIFFICULT LEFT HEART CATHETERIZATION WITH CORONARY ANGIOGRAPHY   PROCEDURE:Consent:  Risks of procedure as well as the alternatives and risks of each were explained to the (patient/caregiver).  Consent for procedure obtained. Consent for signed by MD and patient with RN witness -- placed on chart.   PROCEDURE: The patient was brought to the 2nd [facility] in the fasting state and prepped and draped in the usual sterile fashion for Right groin or radial access. A modified Allen's test with plethysmography was performed, revealing excellent Ulnar artery collateral flow.  Sterile technique was used including antiseptics, cap, gloves,  gown, hand hygiene, mask and sheet.  Skin prep: Chlorhexidine.  Time Out: Verified patient identification, verified procedure, site/side was marked, verified correct patient position, special equipment/implants available, medications/allergies/relevent history reviewed, required imaging and test results available.  Performed  Access:  Right Radial Artery; 6 Fr Sheath -- Seldinger technique (Angiocath Micropuncture Kit)  IA Radial Cocktail, IV Heparin   After multiple unsuccessful attempts were made via the radial access to engage either the right or left coronary artery due to significant subclavian artery tortuosity, the decision was made to convert to femoral access.  Right Common Femoral: 5 French Sheath -- fluoroscopically guided, modified Seldinger technique  Diagnostic Left Heart Catheterization:    Radial Attempt: 5 French JACKI 4.0, JR4, EBU 3.5 - catheters advanced & exchanged over long exchange safety-J wire  The patient's right subclavian artery was extremely tortuous with several bands making catheter torquing nearly impossible. The wire was used for catheter support, despite this I was still unable to engage either the right or left coronary artery.  An essentials kit was used to allow the use of wire for catheter support, but unable to engage either the RCA or LCA.  The decision was made to convert to femoral artery access.  Femoral Access Attempt: 5 French catheters advanced and exchanged over standard J-wire  Right Coronary Artery Angiography: First a JR 4 catheter followed by a No Torque catheter was used to engage the RCA  Left Coronary Artery Angiography:  EBU 3.5 Guide  LV Hemodynamics (LV Gram hand-injection ):  JR 4, after unsuccessful attempts at crossing the catheter, the JR 4 catheter was used.  The need  to switch from radial to femoral access due to tortuous subclavian artery, as well as very difficult to engage native coronary arteries regardless of the  approach makes is a very difficult procedure.  TR Band:  1220 Hours,  13 mL air  Femoral Sheath: To be removed in the holding area with manual pressure  MEDICATIONS:  Anesthesia:  Local Lidocaine  2 ml at right wrist; 18 right groin   Sedation:  2 mg IV Versed,  50 mcg IV fentanyl ;   Omnipaque Contrast:  180 ml  Anticoagulation:  IV Heparin  4500 Units ;   Radial Cocktail: 5 mg Verapamil, 400 mcg NTG, 2 ml 2% Lidocaine in 10 ml NS  Hemodynamics:  Central Aortic / Mean Pressures:  129/73 mmHg;  99 mmHg  Left Ventricular Pressures / EDP:  127/12 mmHg;  19 mmHg  Left Ventriculography:  EF:  Likely 55-60 %  Wall Motion:  Possible inferior hypokinesis  Coronary Anatomy:  Left Main:  Heavily calcified, large-caliber vessel that bifurcates into the LAD, and Circumflex LAD:  Large-caliber vessel that gives off 2 relatively proximal diagonal branches (D1, D2) both of which have ostial lesions. The proximal portion of the LAD is heavily calcified with the terminal portion of the calcified segment having and 70-90% irregular stenosis just prior to the third diagonal branch (D3). There are septal to RPDA collaterals filling a portion of the PDA.  D1:  Moderate caliber vessel with ostial 70-80% stenosis. This vessel courses along the anterolateral wall and otherwise has diffuse luminal irregularities.  D2:  Moderate caliber vessel with proximal subtotal occlusion (95-99%) with downstream flow revealing a significant diagonal branch that bifurcates on the anterolateral wall. There is no significant disease downstream. It is a good target for bypass. Left Circumflex:  Large-caliber vessel that takes a tortuous takeoff from the left main. There is a proximal subtotal occlusion (95-99%) just prior to the bifurcation into a branching, large caliber Lateral OM 1 and the AV groove circumflex that terminates as a left posterior lateral branch.  OM1:  Large-caliber lateral vessel that takes off  just after the significant circumflex lesion. In the early proximal segment there is a inferior branch there is a 100% occluded that takes off from a roughly 70% stenosis. Beyond this the vessel bifurcates into moderate to large caliber superior to inferior branches. The larger superior branch has an ostial/proximal 90% stenosis followed by mesna disease in a tortuous vessel. The inferior branch is relatively free of disease.  The AV groove circumflex terminates as a Left Posterolateral Branch and gives collaterals to what appears to be the distal Right Posterolateral system. Diffuse mild luminal irregularities noted.    RCA:  likely large caliber vessel that is 100% flush occluded with the SA nodal and atrial branch visible.. The atrial branch provides collaterals to the distal right coronary artery.    PATIENT DISPOSITION:    The patient was transferred to the PACU holding area in a hemodynamicaly stable, chest pain free condition.  The patient tolerated the procedure well, and there were no complications.  EBL:   < 15 ml  The patient was stable before, during, and after the procedure.  POST-OPERATIVE DIAGNOSIS:     Severe multivessel CAD including a 100% ostial RCA and proximal inferior branch of OM 1, subtotal occlusion of proximal D2 and proximal potentially codominant Circumflex, ostial 90% superior branch of lateral OM as well as D1, mid LAD 70-90% stenosis.  Relatively preserved LVEF in a vertical heart  making wall motion difficult to determine..  Moderately elevated LVEDP.  PLAN OF CARE:  The patient will be transferred from [facility] to the [place] (2 H). He'll be transferred to the [facility] service currently being covered by Dr. Liam Rogers who is aware the patient's condition.  CVTS consultation has been initiated for multivessel CABG  Restart IV heparin as appropriate post catheterization.  Continue medication adjustment per primary  service.   Leonie Man, M.D., M.S. [facility] [address]. [place], [place]  [address]  [phone]  02/26/2013 1:09 PM

## 2013-02-26 NOTE — Consult Note (Signed)
Reason for Consult: chest pain/unstable angina Referring Physician: Dr. Doutova  Luis E Mccloud Jr. is an 80 y.o. male.  HPI: [name] is an 80 yo man with PMH of atrial fibrillation and hypertension as well as prostate cancer who follows with Dr. Ross for atrial fibrillation who has noted chest pain over the last several days while performing heavier work such as lifting and unpacking. The chest pain is heavy and tight and associated with some radiation to the left shoulder. The pain improves with rest and has lasted as long as 20 minutes. He kept working, having CP, stopping to rest and repeating ultimately coming in to be evalauted. He was initially seen and admitted at [place] and his troponin was noted to be mildly elevated with symptoms concerning for unstable angina so cardiology consulted, heparin gtt initiated and patient made NPO for potential LHC. We discussed the plan for likely LHC and the bed shortage at [place]. No easy bleeding and no upcoming surgeries planned.    Past Medical History  Diagnosis Date  . Personal history of colonic polyps     tubular adenoma  . Hypertension   . Irritable bowel syndrome   . Prostate cancer 1991    sees Dr. Badlani at [facility], observation only   . Paroxysmal atrial fibrillation   . Atrial fibrillation     Past Surgical History  Procedure Laterality Date  . Hemorrhoid surgery    . Prostate biopsy    . Colonoscopy  2011    per Dr. Patterson, benign polyps, no repeats needed   . Cataract extraction, bilateral  2014    per Dr. Shapiro     Family History  Problem Relation Age of Onset  . Diabetes    . Hypertension    . Lung cancer Brother   . Sudden death    . Heart disease    . Stroke    . Heart disease Son   . Stomach cancer Brother     Social History:  reports that he has never smoked. He has never used smokeless tobacco. He reports that he does not drink alcohol or use illicit drugs.  Allergies: No Known  Allergies  Medications:  I have reviewed the patient's current medications. Current Facility-Administered Medications  Medication Dose Route Frequency Provider Last Rate Last Dose  . 0.9 %  sodium chloride infusion  1,000 mL Intravenous Continuous Jon R Knapp, MD 15 mL/hr at 02/25/13 2129 1,000 mL at 02/25/13 2129  . heparin ADULT infusion 100 units/mL (25000 units/250 mL)  1,100 Units/hr Intravenous Continuous Leann Trefz Poindexter, RPH 11 mL/hr at 02/26/13 0121 1,100 Units/hr at 02/26/13 0121   Current Outpatient Prescriptions  Medication Sig Dispense Refill  . aspirin 325 MG tablet Take 325 mg by mouth daily.       . Cholecalciferol (VITAMIN D) 1000 UNITS capsule Take 2,000 Units by mouth every morning.       . fish oil-omega-3 fatty acids 1000 MG capsule Take 1 g by mouth every morning.       . ibuprofen (ADVIL,MOTRIN) 200 MG tablet Take 200 mg by mouth every 6 (six) hours as needed for pain (pain).      . levothyroxine (SYNTHROID, LEVOTHROID) 50 MCG tablet Take 1 tablet (50 mcg total) by mouth daily.  90 tablet  3  . metoprolol (LOPRESSOR) 50 MG tablet Take 1 tablet (50 mg total) by mouth 2 (two) times daily.  180 tablet  3  . vitamin C (ASCORBIC ACID)   500 MG tablet Take 500 mg by mouth every morning.         Prior to Admission:  (Not in a hospital admission) Scheduled:  Continuous: . sodium chloride 1,000 mL (02/25/13 2129)  . heparin 1,100 Units/hr (02/26/13 0121)    Results for orders placed during the hospital encounter of 02/25/13 (from the past 48 hour(s))  CBC     Status: None   Collection Time    02/25/13  9:33 PM      Result Value Range   WBC 5.6  4.0 - 10.5 K/uL   RBC 4.76  4.22 - 5.81 MIL/uL   Hemoglobin 15.1  13.0 - 17.0 g/dL   HCT 42.0  39.0 - 52.0 %   MCV 88.2  78.0 - 100.0 fL   MCH 31.7  26.0 - 34.0 pg   MCHC 36.0  30.0 - 36.0 g/dL   RDW 13.0  11.5 - 15.5 %   Platelets 160  150 - 400 K/uL  COMPREHENSIVE METABOLIC PANEL     Status: Abnormal   Collection  Time    02/25/13  9:33 PM      Result Value Range   Sodium 142  137 - 147 mEq/L   Comment: Please note change in reference range.   Potassium 3.9  3.7 - 5.3 mEq/L   Comment: Please note change in reference range.   Chloride 107  96 - 112 mEq/L   CO2 24  19 - 32 mEq/L   Glucose, Bld 103 (*) 70 - 99 mg/dL   BUN 23  6 - 23 mg/dL   Creatinine, Ser 0.90  0.50 - 1.35 mg/dL   Calcium 9.1  8.4 - 10.5 mg/dL   Total Protein 6.5  6.0 - 8.3 g/dL   Albumin 3.4 (*) 3.5 - 5.2 g/dL   AST 27  0 - 37 U/L   ALT 23  0 - 53 U/L   Alkaline Phosphatase 68  39 - 117 U/L   Total Bilirubin 0.4  0.3 - 1.2 mg/dL   GFR calc non Af Amer 77 (*) >90 mL/min   GFR calc Af Amer 89 (*) >90 mL/min   Comment: (NOTE)     The eGFR has been calculated using the CKD EPI equation.     This calculation has not been validated in all clinical situations.     eGFR's persistently <90 mL/min signify possible Chronic Kidney     Disease.  PROTIME-INR     Status: None   Collection Time    02/25/13  9:33 PM      Result Value Range   Prothrombin Time 14.6  11.6 - 15.2 seconds   INR 1.16  0.00 - 1.49  APTT     Status: None   Collection Time    02/25/13  9:33 PM      Result Value Range   aPTT 27  24 - 37 seconds  POCT I-STAT TROPONIN I     Status: None   Collection Time    02/25/13  9:38 PM      Result Value Range   Troponin i, poc 0.05  0.00 - 0.08 ng/mL   Comment 3            Comment: Due to the release kinetics of cTnI,     a negative result within the first hours     of the onset of symptoms does not rule out     myocardial infarction with certainty.       If myocardial infarction is still suspected,     repeat the test at appropriate intervals.  POCT I-STAT TROPONIN I     Status: Abnormal   Collection Time    02/25/13 11:54 PM      Result Value Range   Troponin i, poc 0.10 (*) 0.00 - 0.08 ng/mL   Comment NOTIFIED PHYSICIAN     Comment 3            Comment: Due to the release kinetics of cTnI,     a negative  result within the first hours     of the onset of symptoms does not rule out     myocardial infarction with certainty.     If myocardial infarction is still suspected,     repeat the test at appropriate intervals.    Dg Chest Portable 1 View  02/25/2013   CLINICAL DATA:  Chest pain  EXAM: PORTABLE CHEST - 1 VIEW  COMPARISON:  DG CHEST 2 VIEW dated 05/25/2012; DG CHEST 2 VIEW dated 03/03/2012; DG CHEST 2 VIEW dated 12/06/2010  FINDINGS: There is stable mild cardiomegaly. Atherosclerotic calcification thoracic aortic arch is noted. Lung volumes are slightly low. Interstitial markings are mildly prominent, and unchanged compared to prior studies. Negative for edema, focal airspace disease, or visible pleural effusion. Negative for pneumothorax. A large hiatal hernia contour is similar to priors. Remote healed left clavicle fracture noted. No acute osseous abnormality.  IMPRESSION: 1. Low lung volumes with chronic mild interstitial prominence. No definite acute findings. 2. Stable cardiomegaly. 3. Stable large hiatal hernia. 4. Atherosclerosis.   Electronically Signed   By: Susan  Turner M.D.   On: 02/25/2013 22:06    Review of Systems  Constitutional: Negative for fever, chills and weight loss.  HENT: Negative for ear pain.   Eyes: Negative for blurred vision and pain.  Respiratory: Negative for cough and sputum production.   Cardiovascular: Positive for chest pain. Negative for palpitations and leg swelling.  Gastrointestinal: Negative for nausea, vomiting and abdominal pain.  Genitourinary: Negative for dysuria and hematuria.  Musculoskeletal: Positive for joint pain. Negative for back pain and myalgias.       Left shoulder  Skin: Negative for rash.  Neurological: Negative for dizziness, tingling, tremors and headaches.  Endo/Heme/Allergies: Negative for environmental allergies. Does not bruise/bleed easily.  Psychiatric/Behavioral: Negative for depression, suicidal ideas and substance abuse.    Blood pressure 156/91, temperature 98.1 F (36.7 C), temperature source Oral, resp. rate 15, height 5' 9" (1.753 m), weight 85.73 kg (189 lb), SpO2 99.00%. Physical Exam  Nursing note and vitals reviewed. Constitutional: He is oriented to person, place, and time. He appears well-developed and well-nourished. No distress.  HENT:  Head: Normocephalic and atraumatic.  Nose: Nose normal.  Mouth/Throat: Oropharynx is clear and moist. No oropharyngeal exudate.  Eyes: Conjunctivae and EOM are normal. Pupils are equal, round, and reactive to light. No scleral icterus.  Neck: Normal range of motion. Neck supple. No JVD present. No tracheal deviation present.  Cardiovascular: Normal rate, normal heart sounds and intact distal pulses.   No murmur heard. Irregularly irregular  Respiratory: Effort normal and breath sounds normal. No respiratory distress. He has no wheezes.  GI: Soft. Bowel sounds are normal. He exhibits no distension. There is no tenderness. There is no rebound.  Musculoskeletal: Normal range of motion. He exhibits no edema.  Neurological: He is alert and oriented to person, place, and time. He has normal reflexes. No cranial nerve deficit. Coordination normal.    Skin: Skin is warm and dry. No rash noted. He is not diaphoretic. No erythema.  Psychiatric: He has a normal mood and affect. His behavior is normal. Thought content normal.   Labs reviewed; inr 1.16, Trop 0.05 --> 0.1 Na 142, K 3.9, bun/cr 23/0.9, albumin 3.4, total protein 6.5, ast/alt 27/23 10/14 Echo EF 60-65%, mild/moderate AI, mild MR EKG atrial fibrillation, subtle ST-t wave/depression inf/laterally - reviewed 3 ECGs, ? Anterior infarct old Chest x-ray: cardiomegaly, chronic pulmonary changes, no acute process  Problem List Chest Pain/Unstable Angina Atrial fibrillation Hypertension Hypothyroidism Prostate Cancer  Assessment/Plan: [name] is a very pleasant 80 yo man with PMH of atrial fibrillation,  hypertension, hypothyroidism, prior prostate cancer transferred from [facility] for unstable angina and potential LHC in the AM. He follows with Dr. Harrington Challenger. He is having chest pain that is worse with exertion, concerning for angina with pain lasting upwards of 40 minutes. Other possibilities include musculoskeletal pain; however, pretest probability is high so LHC is preferred strategy. The pain sounds very typical for angina with age being his biggest risk factor.  - telemetry, NPO - received large aspirin, on heparin gtt - atorvastatin 80 mg qHS, metoprolol 50 mg q12h - agree with continued synthroid - Keep NPO, please keep [facility] notified of our preference to transfer to facilitate LHC today, 02/28/12.    Akshita Italiano 02/26/2013, 1:35 AM

## 2013-02-26 NOTE — ED Notes (Signed)
[facility] ED[facility] and Hillard Danker made aware of I Stat trop results.

## 2013-02-26 NOTE — H&P (View-Only) (Signed)
Reason for Consult: chest pain/unstable angina Referring Physician: Dr. Geralynn Ochs. is an 81 y.o. male.  HPI: Mr. Weintraub is an 80 yo man with PMH of atrial fibrillation and hypertension as well as prostate cancer who follows with Dr. Harrington Challenger for atrial fibrillation who has noted chest pain over the last several days while performing heavier work such as lifting and unpacking. The chest pain is heavy and tight and associated with some radiation to the left shoulder. The pain improves with rest and has lasted as long as 20 minutes. He kept working, having CP, stopping to rest and repeating ultimately coming in to be evalauted. He was initially seen and admitted at [facility] and his troponin was noted to be mildly elevated with symptoms concerning for unstable angina so cardiology consulted, heparin gtt initiated and patient made NPO for potential LHC. We discussed the plan for likely LHC and the bed shortage at [facility]. No easy bleeding and no upcoming surgeries planned.    Past Medical History  Diagnosis Date  . Personal history of colonic polyps     tubular adenoma  . Hypertension   . Irritable bowel syndrome   . Prostate cancer 1991    sees Dr. Carlota Raspberry at [place], observation only   . Paroxysmal atrial fibrillation   . Atrial fibrillation     Past Surgical History  Procedure Laterality Date  . Hemorrhoid surgery    . Prostate biopsy    . Colonoscopy  2011    per Dr. Sharlett Iles, benign polyps, no repeats needed   . Cataract extraction, bilateral  2014    per Dr. Gershon Crane     Family History  Problem Relation Age of Onset  . Diabetes    . Hypertension    . Lung cancer Brother   . Sudden death    . Heart disease    . Stroke    . Heart disease Son   . Stomach cancer Brother     Social History:  reports that he has never smoked. He has never used smokeless tobacco. He reports that he does not drink alcohol or use illicit drugs.  Allergies: No Known  Allergies  Medications:  I have reviewed the patient's current medications. Current Facility-Administered Medications  Medication Dose Route Frequency Provider Last Rate Last Dose  . 0.9 %  sodium chloride infusion  1,000 mL Intravenous Continuous Kathalene Frames, MD 15 mL/hr at 02/25/13 2129 1,000 mL at 02/25/13 2129  . heparin ADULT infusion 100 units/mL (25000 units/250 mL)  1,100 Units/hr Intravenous Continuous Leann Trefz Poindexter, RPH 11 mL/hr at 02/26/13 0121 1,100 Units/hr at 02/26/13 0121   Current Outpatient Prescriptions  Medication Sig Dispense Refill  . aspirin 325 MG tablet Take 325 mg by mouth daily.       . Cholecalciferol (VITAMIN D) 1000 UNITS capsule Take 2,000 Units by mouth every morning.       . fish oil-omega-3 fatty acids 1000 MG capsule Take 1 g by mouth every morning.       [name] ibuprofen (ADVIL,MOTRIN) 200 MG tablet Take 200 mg by mouth every 6 (six) hours as needed for pain (pain).      [name] levothyroxine (SYNTHROID, LEVOTHROID) 50 MCG tablet Take 1 tablet (50 mcg total) by mouth daily.  90 tablet  3  . metoprolol (LOPRESSOR) 50 MG tablet Take 1 tablet (50 mg total) by mouth 2 (two) times daily.  180 tablet  3  . vitamin C (ASCORBIC ACID)  500 MG tablet Take 500 mg by mouth every morning.         Prior to Admission:  (Not in a hospital admission) Scheduled:  Continuous: . sodium chloride 1,000 mL (02/25/13 2129)  . heparin 1,100 Units/hr (02/26/13 0121)    Results for orders placed during the hospital encounter of 02/25/13 (from the past 48 hour(s))  CBC     Status: None   Collection Time    02/25/13  9:33 PM      Result Value Range   WBC 5.6  4.0 - 10.5 K/uL   RBC 4.76  4.22 - 5.81 MIL/uL   Hemoglobin 15.1  13.0 - 17.0 g/dL   HCT 42.0  39.0 - 52.0 %   MCV 88.2  78.0 - 100.0 fL   MCH 31.7  26.0 - 34.0 pg   MCHC 36.0  30.0 - 36.0 g/dL   RDW 13.0  11.5 - 15.5 %   Platelets 160  150 - 400 K/uL  COMPREHENSIVE METABOLIC PANEL     Status: Abnormal   Collection  Time    02/25/13  9:33 PM      Result Value Range   Sodium 142  137 - 147 mEq/L   Comment: Please note change in reference range.   Potassium 3.9  3.7 - 5.3 mEq/L   Comment: Please note change in reference range.   Chloride 107  96 - 112 mEq/L   CO2 24  19 - 32 mEq/L   Glucose, Bld 103 (*) 70 - 99 mg/dL   BUN 23  6 - 23 mg/dL   Creatinine, Ser 0.90  0.50 - 1.35 mg/dL   Calcium 9.1  8.4 - 10.5 mg/dL   Total Protein 6.5  6.0 - 8.3 g/dL   Albumin 3.4 (*) 3.5 - 5.2 g/dL   AST 27  0 - 37 U/L   ALT 23  0 - 53 U/L   Alkaline Phosphatase 68  39 - 117 U/L   Total Bilirubin 0.4  0.3 - 1.2 mg/dL   GFR calc non Af Amer 77 (*) >90 mL/min   GFR calc Af Amer 89 (*) >90 mL/min   Comment: (NOTE)     The eGFR has been calculated using the CKD EPI equation.     This calculation has not been validated in all clinical situations.     eGFR's persistently <90 mL/min signify possible Chronic Kidney     Disease.  PROTIME-INR     Status: None   Collection Time    02/25/13  9:33 PM      Result Value Range   Prothrombin Time 14.6  11.6 - 15.2 seconds   INR 1.16  0.00 - 1.49  APTT     Status: None   Collection Time    02/25/13  9:33 PM      Result Value Range   aPTT 27  24 - 37 seconds  POCT I-STAT TROPONIN I     Status: None   Collection Time    02/25/13  9:38 PM      Result Value Range   Troponin i, poc 0.05  0.00 - 0.08 ng/mL   Comment 3            Comment: Due to the release kinetics of cTnI,     a negative result within the first hours     of the onset of symptoms does not rule out     myocardial infarction with certainty.  If myocardial infarction is still suspected,     repeat the test at appropriate intervals.  POCT I-STAT TROPONIN I     Status: Abnormal   Collection Time    02/25/13 11:54 PM      Result Value Range   Troponin i, poc 0.10 (*) 0.00 - 0.08 ng/mL   Comment NOTIFIED PHYSICIAN     Comment 3            Comment: Due to the release kinetics of cTnI,     a negative  result within the first hours     of the onset of symptoms does not rule out     myocardial infarction with certainty.     If myocardial infarction is still suspected,     repeat the test at appropriate intervals.    Dg Chest Portable 1 View  02/25/2013   CLINICAL DATA:  Chest pain  EXAM: PORTABLE CHEST - 1 VIEW  COMPARISON:  DG CHEST 2 VIEW dated 05/25/2012; DG CHEST 2 VIEW dated 03/03/2012; DG CHEST 2 VIEW dated 12/06/2010  FINDINGS: There is stable mild cardiomegaly. Atherosclerotic calcification thoracic aortic arch is noted. Lung volumes are slightly low. Interstitial markings are mildly prominent, and unchanged compared to prior studies. Negative for edema, focal airspace disease, or visible pleural effusion. Negative for pneumothorax. A large hiatal hernia contour is similar to priors. Remote healed left clavicle fracture noted. No acute osseous abnormality.  IMPRESSION: 1. Low lung volumes with chronic mild interstitial prominence. No definite acute findings. 2. Stable cardiomegaly. 3. Stable large hiatal hernia. 4. Atherosclerosis.   Electronically Signed   By: Susan  Turner M.D.   On: 02/25/2013 22:06    Review of Systems  Constitutional: Negative for fever, chills and weight loss.  HENT: Negative for ear pain.   Eyes: Negative for blurred vision and pain.  Respiratory: Negative for cough and sputum production.   Cardiovascular: Positive for chest pain. Negative for palpitations and leg swelling.  Gastrointestinal: Negative for nausea, vomiting and abdominal pain.  Genitourinary: Negative for dysuria and hematuria.  Musculoskeletal: Positive for joint pain. Negative for back pain and myalgias.       Left shoulder  Skin: Negative for rash.  Neurological: Negative for dizziness, tingling, tremors and headaches.  Endo/Heme/Allergies: Negative for environmental allergies. Does not bruise/bleed easily.  Psychiatric/Behavioral: Negative for depression, suicidal ideas and substance abuse.    Blood pressure 156/91, temperature 98.1 F (36.7 C), temperature source Oral, resp. rate 15, height 5' 9" (1.753 m), weight 85.73 kg (189 lb), SpO2 99.00%. Physical Exam  Nursing note and vitals reviewed. Constitutional: He is oriented to person, place, and time. He appears well-developed and well-nourished. No distress.  HENT:  Head: Normocephalic and atraumatic.  Nose: Nose normal.  Mouth/Throat: Oropharynx is clear and moist. No oropharyngeal exudate.  Eyes: Conjunctivae and EOM are normal. Pupils are equal, round, and reactive to light. No scleral icterus.  Neck: Normal range of motion. Neck supple. No JVD present. No tracheal deviation present.  Cardiovascular: Normal rate, normal heart sounds and intact distal pulses.   No murmur heard. Irregularly irregular  Respiratory: Effort normal and breath sounds normal. No respiratory distress. He has no wheezes.  GI: Soft. Bowel sounds are normal. He exhibits no distension. There is no tenderness. There is no rebound.  Musculoskeletal: Normal range of motion. He exhibits no edema.  Neurological: He is alert and oriented to person, place, and time. He has normal reflexes. No cranial nerve deficit. Coordination normal.    Skin: Skin is warm and dry. No rash noted. He is not diaphoretic. No erythema.  Psychiatric: He has a normal mood and affect. His behavior is normal. Thought content normal.   Labs reviewed; inr 1.16, Trop 0.05 --> 0.1 Na 142, K 3.9, bun/cr 23/0.9, albumin 3.4, total protein 6.5, ast/alt 27/23 10/14 Echo EF 60-65%, mild/moderate AI, mild MR EKG atrial fibrillation, subtle ST-t wave/depression inf/laterally - reviewed 3 ECGs, ? Anterior infarct old Chest x-ray: cardiomegaly, chronic pulmonary changes, no acute process  Problem List Chest Pain/Unstable Angina Atrial fibrillation Hypertension Hypothyroidism Prostate Cancer  Assessment/Plan: Mr. Tavano is a very pleasant 80 yo man with PMH of atrial fibrillation,  hypertension, hypothyroidism, prior prostate cancer transferred from [facility] for unstable angina and potential LHC in the AM. He follows with Dr. Harrington Challenger. He is having chest pain that is worse with exertion, concerning for angina with pain lasting upwards of 40 minutes. Other possibilities include musculoskeletal pain; however, pretest probability is high so LHC is preferred strategy. The pain sounds very typical for angina with age being his biggest risk factor.  - telemetry, NPO - received large aspirin, on heparin gtt - atorvastatin 80 mg qHS, metoprolol 50 mg q12h - agree with continued synthroid - Keep NPO, please keep [facility] notified of our preference to transfer to facilitate LHC today, 02/28/12.    Zamia Tyminski 02/26/2013, 1:35 AM

## 2013-02-27 ENCOUNTER — Inpatient Hospital Stay (HOSPITAL_COMMUNITY): Payer: Medicare HMO

## 2013-02-27 ENCOUNTER — Encounter (HOSPITAL_COMMUNITY): Payer: Self-pay | Admitting: Certified Registered Nurse Anesthetist

## 2013-02-27 DIAGNOSIS — Z01810 Encounter for preprocedural cardiovascular examination: Secondary | ICD-10-CM

## 2013-02-27 DIAGNOSIS — R079 Chest pain, unspecified: Secondary | ICD-10-CM

## 2013-02-27 DIAGNOSIS — I059 Rheumatic mitral valve disease, unspecified: Secondary | ICD-10-CM

## 2013-02-27 DIAGNOSIS — I200 Unstable angina: Secondary | ICD-10-CM

## 2013-02-27 DIAGNOSIS — I2510 Atherosclerotic heart disease of native coronary artery without angina pectoris: Secondary | ICD-10-CM

## 2013-02-27 DIAGNOSIS — I4891 Unspecified atrial fibrillation: Secondary | ICD-10-CM

## 2013-02-27 LAB — TYPE AND SCREEN
ABO/RH(D): O POS
ANTIBODY SCREEN: NEGATIVE

## 2013-02-27 LAB — BLOOD GAS, ARTERIAL
Acid-base deficit: 1.1 mmol/L (ref 0.0–2.0)
Bicarbonate: 22.4 mEq/L (ref 20.0–24.0)
Drawn by: 225631
FIO2: 0.21 %
O2 Saturation: 97 %
PO2 ART: 79.3 mmHg — AB (ref 80.0–100.0)
Patient temperature: 98.6
TCO2: 23.4 mmol/L (ref 0–100)
pCO2 arterial: 33.4 mmHg — ABNORMAL LOW (ref 35.0–45.0)
pH, Arterial: 7.442 (ref 7.350–7.450)

## 2013-02-27 LAB — CBC
HEMATOCRIT: 42.4 % (ref 39.0–52.0)
HEMOGLOBIN: 15.1 g/dL (ref 13.0–17.0)
MCH: 31.7 pg (ref 26.0–34.0)
MCHC: 35.6 g/dL (ref 30.0–36.0)
MCV: 88.9 fL (ref 78.0–100.0)
Platelets: 155 10*3/uL (ref 150–400)
RBC: 4.77 MIL/uL (ref 4.22–5.81)
RDW: 13.4 % (ref 11.5–15.5)
WBC: 8 10*3/uL (ref 4.0–10.5)

## 2013-02-27 LAB — URINALYSIS, ROUTINE W REFLEX MICROSCOPIC
GLUCOSE, UA: NEGATIVE mg/dL
Ketones, ur: 15 mg/dL — AB
Leukocytes, UA: NEGATIVE
NITRITE: NEGATIVE
PH: 5.5 (ref 5.0–8.0)
Protein, ur: NEGATIVE mg/dL
SPECIFIC GRAVITY, URINE: 1.029 (ref 1.005–1.030)
Urobilinogen, UA: 0.2 mg/dL (ref 0.0–1.0)

## 2013-02-27 LAB — PULMONARY FUNCTION TEST
FEF 25-75 Post: 2.23 L/sec
FEF 25-75 Pre: 1.74 L/sec
FEF2575-%CHANGE-POST: 28 %
FEF2575-%Pred-Post: 130 %
FEF2575-%Pred-Pre: 101 %
FEV1-%CHANGE-POST: 9 %
FEV1-%Pred-Post: 102 %
FEV1-%Pred-Pre: 93 %
FEV1-Post: 2.67 L
FEV1-Pre: 2.43 L
FEV1FVC-%Change-Post: 5 %
FEV1FVC-%Pred-Pre: 101 %
FEV6-%Change-Post: 4 %
FEV6-%PRED-PRE: 97 %
FEV6-%Pred-Post: 101 %
FEV6-POST: 3.5 L
FEV6-Pre: 3.34 L
FEV6FVC-%Change-Post: 0 %
FEV6FVC-%PRED-POST: 106 %
FEV6FVC-%PRED-PRE: 106 %
FVC-%Change-Post: 4 %
FVC-%PRED-PRE: 91 %
FVC-%Pred-Post: 95 %
FVC-POST: 3.53 L
FVC-Pre: 3.38 L
Post FEV1/FVC ratio: 76 %
Post FEV6/FVC ratio: 99 %
Pre FEV1/FVC ratio: 72 %
Pre FEV6/FVC Ratio: 99 %

## 2013-02-27 LAB — ABO/RH: ABO/RH(D): O POS

## 2013-02-27 LAB — SURGICAL PCR SCREEN
MRSA, PCR: NEGATIVE
STAPHYLOCOCCUS AUREUS: POSITIVE — AB

## 2013-02-27 LAB — HEPARIN LEVEL (UNFRACTIONATED): HEPARIN UNFRACTIONATED: 0.33 [IU]/mL (ref 0.30–0.70)

## 2013-02-27 LAB — URINE MICROSCOPIC-ADD ON

## 2013-02-27 MED ORDER — VANCOMYCIN HCL 1000 MG IV SOLR
INTRAVENOUS | Status: AC
  Administered 2013-02-28: 08:00:00
  Filled 2013-02-27: qty 1000

## 2013-02-27 MED ORDER — DOPAMINE-DEXTROSE 3.2-5 MG/ML-% IV SOLN
2.0000 ug/kg/min | INTRAVENOUS | Status: DC
  Filled 2013-02-27: qty 250

## 2013-02-27 MED ORDER — MUPIROCIN 2 % EX OINT
1.0000 "application " | TOPICAL_OINTMENT | Freq: Two times a day (BID) | CUTANEOUS | Status: DC
  Administered 2013-02-27: 1 via NASAL
  Filled 2013-02-27: qty 22

## 2013-02-27 MED ORDER — BISACODYL 5 MG PO TBEC
5.0000 mg | DELAYED_RELEASE_TABLET | Freq: Once | ORAL | Status: DC
  Filled 2013-02-27: qty 1

## 2013-02-27 MED ORDER — TEMAZEPAM 15 MG PO CAPS: 15.0000 mg | ORAL_CAPSULE | Freq: Once | ORAL | Status: AC | PRN

## 2013-02-27 MED ORDER — EPINEPHRINE HCL 1 MG/ML IJ SOLN
0.5000 ug/min | INTRAVENOUS | Status: DC
  Filled 2013-02-27: qty 4

## 2013-02-27 MED ORDER — CHLORHEXIDINE GLUCONATE CLOTH 2 % EX PADS
6.0000 | MEDICATED_PAD | Freq: Every day | CUTANEOUS | Status: DC
  Administered 2013-02-27: 20:00:00 6 via TOPICAL

## 2013-02-27 MED ORDER — NITROGLYCERIN IN D5W 200-5 MCG/ML-% IV SOLN
2.0000 ug/min | INTRAVENOUS | Status: DC
  Filled 2013-02-27: qty 250

## 2013-02-27 MED ORDER — CHLORHEXIDINE GLUCONATE 4 % EX LIQD
60.0000 mL | Freq: Once | CUTANEOUS | Status: AC
  Administered 2013-02-27: 4 via TOPICAL

## 2013-02-27 MED ORDER — METOPROLOL TARTRATE 12.5 MG HALF TABLET
12.5000 mg | ORAL_TABLET | Freq: Once | ORAL | Status: AC
Start: 2013-02-28 — End: 2013-02-28
  Administered 2013-02-28: 06:00:00 12.5 mg via ORAL
  Filled 2013-02-27: qty 1

## 2013-02-27 MED ORDER — SODIUM CHLORIDE 0.9 % IV SOLN
INTRAVENOUS | Status: DC
  Filled 2013-02-27: qty 40

## 2013-02-27 MED ORDER — DEXTROSE 5 % IV SOLN
30.0000 ug/min | INTRAVENOUS | Status: DC
  Filled 2013-02-27: qty 2

## 2013-02-27 MED ORDER — ALBUTEROL SULFATE (2.5 MG/3ML) 0.083% IN NEBU
2.5000 mg | INHALATION_SOLUTION | Freq: Once | RESPIRATORY_TRACT | Status: AC
  Administered 2013-02-27: 2.5 mg via RESPIRATORY_TRACT

## 2013-02-27 MED ORDER — CHLORHEXIDINE GLUCONATE 4 % EX LIQD
60.0000 mL | Freq: Once | CUTANEOUS | Status: AC
  Administered 2013-02-28: 4 via TOPICAL

## 2013-02-27 MED ORDER — MAGNESIUM SULFATE 50 % IJ SOLN
40.0000 meq | INTRAMUSCULAR | Status: DC
  Filled 2013-02-27: qty 10

## 2013-02-27 MED ORDER — SODIUM CHLORIDE 0.9 % IV SOLN
INTRAVENOUS | Status: DC
  Filled 2013-02-27: qty 30

## 2013-02-27 MED ORDER — DEXTROSE 5 % IV SOLN
750.0000 mg | INTRAVENOUS | Status: DC
  Filled 2013-02-27: qty 750

## 2013-02-27 MED ORDER — POTASSIUM CHLORIDE 2 MEQ/ML IV SOLN
80.0000 meq | INTRAVENOUS | Status: DC
  Filled 2013-02-27: qty 40

## 2013-02-27 MED ORDER — DEXTROSE 5 % IV SOLN
1.5000 g | INTRAVENOUS | Status: DC
  Filled 2013-02-27: qty 1.5

## 2013-02-27 MED ORDER — DEXMEDETOMIDINE HCL IN NACL 400 MCG/100ML IV SOLN
0.1000 ug/kg/h | INTRAVENOUS | Status: DC
  Filled 2013-02-27: qty 100

## 2013-02-27 MED ORDER — PLASMA-LYTE 148 IV SOLN
INTRAVENOUS | Status: AC
  Administered 2013-02-28: 08:00:00
  Filled 2013-02-27: qty 2.5

## 2013-02-27 MED ORDER — SODIUM CHLORIDE 0.9 % IV SOLN
INTRAVENOUS | Status: DC
  Administered 2013-02-28: 17:00:00 via INTRAVENOUS
  Filled 2013-02-27: qty 1

## 2013-02-27 MED ORDER — SODIUM CHLORIDE 0.9 % IV SOLN
1250.0000 mg | INTRAVENOUS | Status: AC
  Administered 2013-02-28: 1250 mg via INTRAVENOUS
  Filled 2013-02-27: qty 1250

## 2013-02-27 NOTE — Progress Notes (Signed)
  Echocardiogram 2D Echocardiogram has been performed.  [name] 02/27/2013, 9:28 AM

## 2013-02-27 NOTE — Progress Notes (Addendum)
*  Preliminary Results*   Pre-op Cardiac Surgery  Carotid Findings:  Findings suggest 1-39% internal carotid artery stenosis bilaterally. Vertebral arteries are patent with antegrade flow; the left vertebral artery appears to have an atypical waveform.  Upper Extremity Right Left  Brachial Pressures 105-Triphasic 93-Triphasic  Radial Waveforms Biphasic Biphasic  Ulnar Waveforms Biphasic Biphasic  Palmar Arch (Allen's Test) Within normal limits Signal obliterates with radial compression, is unaffected with ulnar compression.   Findings:   Bilateral palpable pedal pulses.  02/27/2013 3:54 PM Maudry Mayhew, RVT, RDCS, RDMS  Landry Mellow, RVT, RDMS

## 2013-02-27 NOTE — Progress Notes (Signed)
[address]       [place] [address]             [phone]     CARDIOTHORACIC SURGERY PROGRESS NOTE  1 Day Post-Op  S/P Procedure(s) (LRB): LEFT HEART CATHETERIZATION WITH CORONARY ANGIOGRAM (N/A)  Subjective: No complaints.  Denies chest pain or SOB  Objective: Vital signs in last 24 hours: Temp:  [97.7 F (36.5 C)-98.7 F (37.1 C)] 98.1 F (36.7 C) (01/06 1140) Pulse Rate:  [70-83] 70 (01/06 1140) Cardiac Rhythm:  [-] Atrial fibrillation (01/06 0745) Resp:  [16-20] 20 (01/06 1140) BP: (104-166)/(64-90) 104/64 mmHg (01/06 1140) SpO2:  [95 %-100 %] 95 % (01/06 1140) Weight:  [84.3 kg (185 lb 13.6 oz)] 84.3 kg (185 lb 13.6 oz) (01/06 0053)  Physical Exam:  Rhythm:   Afib  Breath sounds: clear  Heart sounds:  irreg  Incisions:  n/a  Abdomen:  soft  Extremities:  warm   Intake/Output from previous day: 01/05 0701 - 01/06 0700 In: 240 [P.O.:240] Out: 925 [Urine:925] Intake/Output this shift: Total I/O In: 720 [P.O.:720] Out: 150 [Urine:150]  Lab Results:  Recent Labs  02/26/13 0920 02/27/13 0520  WBC 5.1 8.0  HGB 14.6 15.1  HCT 41.3 42.4  PLT 147* 155   BMET:  Recent Labs  02/25/13 2133 02/26/13 0521  NA 142 142  K 3.9 3.6*  CL 107 106  CO2 24 23  GLUCOSE 103* 98  BUN 23 18  CREATININE 0.90 0.84  CALCIUM 9.1 8.9    CBG (last 3)  No results found for this basename: GLUCAP,  in the last 72 hours PT/INR:   Recent Labs  02/26/13 0920  LABPROT 15.0  INR 1.21    CXR:   COMPARISON: DG CHEST 2 VIEW dated 05/25/2012; DG CHEST 2 VIEW dated  03/03/2012; DG CHEST 2 VIEW dated 12/06/2010  FINDINGS:  There is stable mild cardiomegaly. Atherosclerotic calcification  thoracic aortic arch is noted. Lung volumes are slightly low.  Interstitial markings are mildly prominent, and unchanged compared  to prior studies. Negative for edema, focal airspace disease, or  visible pleural effusion. Negative for pneumothorax. A large hiatal   hernia contour is similar to priors. Remote healed left clavicle  fracture noted. No acute osseous abnormality.  IMPRESSION:  1. Low lung volumes with chronic mild interstitial prominence. No  definite acute findings.  2. Stable cardiomegaly.  3. Stable large hiatal hernia.  4. Atherosclerosis.  Electronically Signed  By: Curlene Dolphin M.D.  On: 02/25/2013 22:06   Transthoracic Echocardiography  Patient: [name], [name] MR #: [id number] Study Date: 02/27/2013 Gender: M Age: 80 Height: 175.3cm Weight: 83.9kg BSA: 14m^2 Pt. Status: Room: 6C03C  ATTENDING Nahser, Unice Cobble SONOGRAPHER [place], Inpatient cc:  ------------------------------------------------------------ LV EF: 60%  ------------------------------------------------------------ Indications: CAD of native vessels 414.01.  ------------------------------------------------------------ History: PMH: Chest pain. Atrial fibrillation. Risk factors: Dyslipidemia.  ------------------------------------------------------------ Study Conclusions  - Left ventricle: Technically difficult study. The cavity size was normal. Wall thickness was normal. The estimated ejection fraction was 60%. Wall motion was normal; there were no regional wall motion abnormalities. - Aortic valve: Sclerosis without stenosis. Trivial regurgitation. - Mitral valve: Mild regurgitation. - Left atrium: The atrium was mildly dilated. - Right ventricle: Poorly visualized. The cavity size was mildly dilated. Systolic function was mildly reduced. - Right atrium: The atrium was mildly dilated. - Pulmonary arteries: PA peak pressure: 98mm Hg (  S). Transthoracic echocardiography. M-mode, complete 2D, spectral Doppler, and color Doppler. Height: Height: 175.3cm. Height: 69in. Weight: Weight: 83.9kg. Weight: 184.6lb. Body mass index: BMI: 27.3kg/m^2. Body  surface area: BSA: 29m^2. Blood pressure: 155/89. Patient status: Inpatient. Location: ICU/CCU  ------------------------------------------------------------  ------------------------------------------------------------ Left ventricle: Technically difficult study. The cavity size was normal. Wall thickness was normal. The estimated ejection fraction was 60%. Wall motion was normal; there were no regional wall motion abnormalities.  ------------------------------------------------------------ Aortic valve: Sclerosis without stenosis. Doppler: Trivial regurgitation.  ------------------------------------------------------------ Aorta: Aortic root: The aortic root was normal in size.  ------------------------------------------------------------ Mitral valve: Mildly thickened leaflets . Doppler: Mild regurgitation. Peak gradient: 58mm Hg (D).  ------------------------------------------------------------ Left atrium: The atrium was mildly dilated.  ------------------------------------------------------------ Right ventricle: Poorly visualized. The cavity size was mildly dilated. Systolic function was mildly reduced.  ------------------------------------------------------------ Pulmonic valve: The valve appears to be grossly normal. Doppler: Mild regurgitation.  ------------------------------------------------------------ Tricuspid valve: Structurally normal valve. Leaflet separation was normal. Doppler: Transvalvular velocity was within the normal range. Mild regurgitation.  ------------------------------------------------------------ Right atrium: The atrium was mildly dilated.  ------------------------------------------------------------ Pericardium: There was no pericardial effusion.  ------------------------------------------------------------  2D measurements Normal Doppler Normal Left ventricle measurements LVID ED, 50.1 mm 43-52 Main pulmonary chord, artery PLAX  Pressure, S 38 mm =30 LVID ES, 39.2 mm 23-38 Hg chord, Left ventricle PLAX Ea, lat 14 cm/ ------- FS, chord, 22 % >29 ann, tiss s PLAX DP LVPW, ED 8.95 mm ------ E/Ea, lat 9.93 ------- IVS/LVPW 1.02 <1.3 ann, tiss ratio, ED DP Ventricular septum Ea, med 6.94 cm/ ------- IVS, ED 9.15 mm ------ ann, tiss s Aorta DP Root diam, 32 mm ------ E/Ea, med 20.03 ------- ED ann, tiss Left atrium DP AP dim 46 mm ------ Mitral valve AP dim 2.3 cm/m^2 <2.2 Peak E vel 139 cm/ ------- index s Deceleratio 218 ms 150-230 n time Peak 8 mm ------- gradient, D Hg Max regurg 517 cm/ ------- vel s Tricuspid valve Regurg peak 279 cm/ ------- vel s Peak RV-RA 31 mm ------- gradient, S Hg Max regurg 279 cm/ ------- vel s Systemic veins Estimated 7 mm ------- CVP Hg Right ventricle Pressure, S 38 mm <30 Hg Sa vel, lat 6.4 cm/ ------- ann, tiss s DP  ------------------------------------------------------------ Prepared and Electronically Authenticated by  Dola Argyle 2015-01-06T12:32:[id number]         Assessment/Plan: S/P Procedure(s) (LRB): LEFT HEART CATHETERIZATION WITH CORONARY ANGIOGRAM (N/A)  Patient remains clinically stable post catheterization. I have again reviewed the indications, risks, and potential benefits of surgery with the patient and his family. We plan to proceed with surgery tomorrow. All of their questions been addressed.    Alisabeth Selkirk H 02/27/2013 3:47 PM

## 2013-02-27 NOTE — Progress Notes (Signed)
[id number] Discussed with pt and family importance of mobility after surgery. Demonstrated getting up and down without use of arms due to sternal precautions. Pt has IS and states can get to 2590ml. Gave [facility] information sheet for family to read and [facility] booklet. Discussed how to pull up preop video to view on TV.  Will follow up after surgery. Graylon Good RN BSN 02/27/2013 1:26 PM

## 2013-02-27 NOTE — Progress Notes (Signed)
Heparin per pharmacy  80 yo male here from [facility] w/ CP. Patient s/p cath with multivessel CAD for CVTS consult  PMH: atrial fibrillation and hypertension as well as prostate cancer   Anticoagulation: heparin s/p cath. Started 8 hrs post sheath. Last rate was 1100 units/hr with heparin level at 0.54.  HL >1.1 this am but was drawn from same arm as hep.  Redrawn HL was 0.33.  Hgb 15.1 and Plt 155 K.  Will go to OR for CABG in am  Plan Continue heparin at 1100 units/hr until 2300 tonight as ordered by MD for CABG in am Daily heparin level and CBC

## 2013-02-27 NOTE — Progress Notes (Signed)
Subjective: Sleeping soundly.  No complaints  Objective: Vital signs in last 24 hours: Temp:  [97.7 F (36.5 C)-98.7 F (37.1 C)] 97.7 F (36.5 C) (01/06 0527) Pulse Rate:  [76-94] 82 (01/06 0527) Resp:  [16-18] 18 (01/06 0527) BP: (142-166)/(74-90) 154/90 mmHg (01/06 0527) SpO2:  [95 %-100 %] 95 % (01/06 0053) Weight:  [185 lb 3 oz (84 kg)-185 lb 13.6 oz (84.3 kg)] 185 lb 13.6 oz (84.3 kg) (01/06 0053) Last BM Date: 02/25/13  Intake/Output from previous day: 01/05 0701 - 01/06 0700 In: 240 [P.O.:240] Out: 925 [Urine:925] Intake/Output this shift: Total I/O In: 240 [P.O.:240] Out: 700 [Urine:700]  Medications Current Facility-Administered Medications  Medication Dose Route Frequency Provider Last Rate Last Dose  . 0.9 %  sodium chloride infusion  250 mL Intravenous PRN Leonie Man, MD      . acetaminophen (TYLENOL) tablet 650 mg  650 mg Oral Q6H PRN Toy Baker, MD       Or  . acetaminophen (TYLENOL) suppository 650 mg  650 mg Rectal Q6H PRN Toy Baker, MD      . aspirin tablet 325 mg  325 mg Oral Daily Toy Baker, MD      . bisacodyl (DULCOLAX) EC tablet 5 mg  5 mg Oral Once Rexene Alberts, MD      . chlorhexidine (HIBICLENS) 4 % liquid 4 application  60 mL Topical Once Rexene Alberts, MD       And  . Derrill Memo ON 02/28/2013] chlorhexidine (HIBICLENS) 4 % liquid 4 application  60 mL Topical Once Rexene Alberts, MD      . docusate sodium (COLACE) capsule 100 mg  100 mg Oral BID Toy Baker, MD      . heparin ADULT infusion 100 units/mL (25000 units/250 mL)  1,100 Units/hr Intravenous Continuous Rexene Alberts, MD 11 mL/hr at 02/27/13 0200 1,100 Units/hr at 02/27/13 0200  . HYDROcodone-acetaminophen (NORCO/VICODIN) 5-325 MG per tablet 1-2 tablet  1-2 tablet Oral Q4H PRN Toy Baker, MD      . levothyroxine (SYNTHROID, LEVOTHROID) tablet 50 mcg  50 mcg Oral QAC breakfast Toy Baker, MD   50 mcg at 02/26/13 0733  . metoprolol  (LOPRESSOR) tablet 50 mg  50 mg Oral BID Toy Baker, MD   50 mg at 02/26/13 2157  . [START ON 02/28/2013] metoprolol tartrate (LOPRESSOR) tablet 12.5 mg  12.5 mg Oral Once Rexene Alberts, MD      . ondansetron [facility]) tablet 4 mg  4 mg Oral Q6H PRN Toy Baker, MD       Or  . ondansetron (ZOFRAN) injection 4 mg  4 mg Intravenous Q6H PRN Toy Baker, MD      . sodium chloride 0.9 % injection 3 mL  3 mL Intravenous Q12H Leonie Man, MD      . sodium chloride 0.9 % injection 3 mL  3 mL Intravenous PRN Leonie Man, MD      . temazepam (RESTORIL) capsule 15 mg  15 mg Oral Once PRN Rexene Alberts, MD        PE: General appearance: alert, cooperative and no distress Lungs: clear to auscultation bilaterally Heart: irregularly irregular rhythm and No MRG Extremities: No LEE Pulses: 2+ and symmetric 1+ PTs Skin: Mild ecchymosis in the right wrist cath site.  Right groin is nontender.  No hematoma or ecchymosis.  Neurologic: Grossly normal  Lab Results:   Recent Labs  02/26/13 0521 02/26/13 0920 02/27/13 0520  WBC 5.6 5.1 8.0  HGB 15.0 14.6 15.1  HCT 42.9 41.3 42.4  PLT 159 147* 155   BMET  Recent Labs  02/25/13 2133 02/26/13 0521  NA 142 142  K 3.9 3.6*  CL 107 106  CO2 24 23  GLUCOSE 103* 98  BUN 23 18  CREATININE 0.90 0.84  CALCIUM 9.1 8.9   PT/INR  Recent Labs  02/25/13 2133 02/26/13 0920  LABPROT 14.6 15.0  INR 1.16 1.21   Cholesterol  Recent Labs  02/26/13 0521  CHOL 155   Lipid Panel     Component Value Date/Time   CHOL 155 02/26/2013 0521   TRIG 78 02/26/2013 0521   HDL 37* 02/26/2013 0521   CHOLHDL 4.2 02/26/2013 0521   VLDL 16 02/26/2013 0521   LDLCALC 102* 02/26/2013 0521     Cardiac Panel (last 3 results)  Recent Labs  02/26/13 0521 02/26/13 0920  TROPONINI <0.30 <0.30    Assessment/Plan   Principal Problem:   Unstable angina Active Problems:   HYPERTENSION   Chest pain on exertion   Atrial  fibrillation  Plan:  SP left heart cath which revealed severe multivessel CAD including a 100% ostial RCA and proximal inferior branch of OM 1, subtotal occlusion of proximal D2 and proximal potentially codominant Circumflex, ostial 90% superior branch of lateral OM as well as D1, mid LAD 70-90% stenosis. Relatively preserved LVEF in a vertical heart making wall motion difficult to determine.. Moderately elevated LVEDP.   CABG planned for Wednesday, Jan 7.  ASA, lopressor 50mg  BID.  BP elevated.  Will see how he is after morning meds.    LOS: 2 days    [name] 02/27/2013 6:25 AM

## 2013-02-27 NOTE — Progress Notes (Signed)
I have seen and evaluated the patient this AM along with Tarri Fuller, PA. I agree with his findings, examination as well as impression recommendations.  No further CP on IV Heparin.  Multivessel CAD noted on Cath yesterday - seen by Dr. Roxy Manns from [place] last PM - plan is CABG in AM.    BP is a bit elevated, but am reluctant to increase BP meds pre-operatively. On ASA.  Will add Statin.  Continue IV Heparin based upon extent of CAD with ACS presentation. Echo ordered.  Leonie Man, M.D., M.S. [facility] [address] [place], [place]  [address]  [phone] Pager # [phone] 02/27/2013 10:23 AM

## 2013-02-28 ENCOUNTER — Inpatient Hospital Stay (HOSPITAL_COMMUNITY): Payer: Medicare HMO | Admitting: Certified Registered Nurse Anesthetist

## 2013-02-28 ENCOUNTER — Inpatient Hospital Stay (HOSPITAL_COMMUNITY): Payer: Medicare HMO

## 2013-02-28 ENCOUNTER — Encounter (HOSPITAL_COMMUNITY)
Admission: EM | Disposition: A | Discharge status: Skilled Nursing Facility | Payer: Medicare HMO | Source: Home / Self Care | Attending: Thoracic Surgery (Cardiothoracic Vascular Surgery)

## 2013-02-28 ENCOUNTER — Encounter (HOSPITAL_COMMUNITY): Payer: Self-pay | Admitting: Thoracic Surgery (Cardiothoracic Vascular Surgery)

## 2013-02-28 ENCOUNTER — Encounter (HOSPITAL_COMMUNITY): Payer: Medicare HMO | Admitting: Certified Registered Nurse Anesthetist

## 2013-02-28 DIAGNOSIS — Z8679 Personal history of other diseases of the circulatory system: Secondary | ICD-10-CM

## 2013-02-28 DIAGNOSIS — Z9889 Other specified postprocedural states: Secondary | ICD-10-CM

## 2013-02-28 DIAGNOSIS — I4891 Unspecified atrial fibrillation: Secondary | ICD-10-CM

## 2013-02-28 DIAGNOSIS — I059 Rheumatic mitral valve disease, unspecified: Secondary | ICD-10-CM

## 2013-02-28 DIAGNOSIS — I2510 Atherosclerotic heart disease of native coronary artery without angina pectoris: Secondary | ICD-10-CM

## 2013-02-28 DIAGNOSIS — Z951 Presence of aortocoronary bypass graft: Secondary | ICD-10-CM

## 2013-02-28 HISTORY — PX: MITRAL VALVE REPAIR: SHX2039

## 2013-02-28 HISTORY — PX: CORONARY ARTERY BYPASS GRAFT: SHX141

## 2013-02-28 HISTORY — PX: MAZE: SHX5063

## 2013-02-28 HISTORY — DX: Other specified postprocedural states: Z98.890

## 2013-02-28 HISTORY — DX: Personal history of other diseases of the circulatory system: Z86.79

## 2013-02-28 HISTORY — DX: Presence of aortocoronary bypass graft: Z95.1

## 2013-02-28 HISTORY — PX: INTRAOPERATIVE TRANSESOPHAGEAL ECHOCARDIOGRAM: SHX5062

## 2013-02-28 LAB — POCT I-STAT, CHEM 8
BUN: 11 mg/dL (ref 6–23)
CHLORIDE: 107 meq/L (ref 96–112)
Calcium, Ion: 1.21 mmol/L (ref 1.13–1.30)
Creatinine, Ser: 0.8 mg/dL (ref 0.50–1.35)
GLUCOSE: 131 mg/dL — AB (ref 70–99)
HEMATOCRIT: 26 % — AB (ref 39.0–52.0)
Hemoglobin: 8.8 g/dL — ABNORMAL LOW (ref 13.0–17.0)
Potassium: 3.5 mEq/L — ABNORMAL LOW (ref 3.7–5.3)
Sodium: 142 mEq/L (ref 137–147)
TCO2: 21 mmol/L (ref 0–100)

## 2013-02-28 LAB — POCT I-STAT 4, (NA,K, GLUC, HGB,HCT)
Glucose, Bld: 96 mg/dL (ref 70–99)
Glucose, Bld: 107 mg/dL — ABNORMAL HIGH (ref 70–99)
Glucose, Bld: 94 mg/dL (ref 70–99)
Glucose, Bld: 115 mg/dL — ABNORMAL HIGH (ref 70–99)
Glucose, Bld: 117 mg/dL — ABNORMAL HIGH (ref 70–99)
Glucose, Bld: 129 mg/dL — ABNORMAL HIGH (ref 70–99)
Glucose, Bld: 122 mg/dL — ABNORMAL HIGH (ref 70–99)
HCT: 37 % — ABNORMAL LOW (ref 39.0–52.0)
HCT: 30 % — ABNORMAL LOW (ref 39.0–52.0)
HCT: 27 % — ABNORMAL LOW (ref 39.0–52.0)
HCT: 28 % — ABNORMAL LOW (ref 39.0–52.0)
HEMATOCRIT: 35 % — AB (ref 39.0–52.0)
HEMATOCRIT: 29 % — AB (ref 39.0–52.0)
HEMATOCRIT: 28 % — AB (ref 39.0–52.0)
HEMOGLOBIN: 11.9 g/dL — AB (ref 13.0–17.0)
HEMOGLOBIN: 9.9 g/dL — AB (ref 13.0–17.0)
HEMOGLOBIN: 9.5 g/dL — AB (ref 13.0–17.0)
Hemoglobin: 12.6 g/dL — ABNORMAL LOW (ref 13.0–17.0)
Hemoglobin: 10.2 g/dL — ABNORMAL LOW (ref 13.0–17.0)
Hemoglobin: 9.2 g/dL — ABNORMAL LOW (ref 13.0–17.0)
Hemoglobin: 9.5 g/dL — ABNORMAL LOW (ref 13.0–17.0)
POTASSIUM: 3.7 meq/L (ref 3.7–5.3)
Potassium: 3.7 mEq/L (ref 3.7–5.3)
Potassium: 3.6 meq/L — ABNORMAL LOW (ref 3.7–5.3)
Potassium: 4.5 meq/L (ref 3.7–5.3)
Potassium: 4.8 meq/L (ref 3.7–5.3)
Potassium: 4 mEq/L (ref 3.7–5.3)
Potassium: 4.7 mEq/L (ref 3.7–5.3)
SODIUM: 142 meq/L (ref 137–147)
Sodium: 141 mEq/L (ref 137–147)
Sodium: 142 meq/L (ref 137–147)
Sodium: 137 meq/L (ref 137–147)
Sodium: 140 meq/L (ref 137–147)
Sodium: 141 mEq/L (ref 137–147)
Sodium: 141 mEq/L (ref 137–147)

## 2013-02-28 LAB — CBC
HCT: 41.3 % (ref 39.0–52.0)
HCT: 26.8 % — ABNORMAL LOW (ref 39.0–52.0)
HEMATOCRIT: 27.5 % — AB (ref 39.0–52.0)
HEMATOCRIT: 26.8 % — AB (ref 39.0–52.0)
HEMOGLOBIN: 10.1 g/dL — AB (ref 13.0–17.0)
HEMOGLOBIN: 9.6 g/dL — AB (ref 13.0–17.0)
Hemoglobin: 14.5 g/dL (ref 13.0–17.0)
Hemoglobin: 9.5 g/dL — ABNORMAL LOW (ref 13.0–17.0)
MCH: 31.3 pg (ref 26.0–34.0)
MCH: 31.6 pg (ref 26.0–34.0)
MCH: 32.3 pg (ref 26.0–34.0)
MCH: 31.7 pg (ref 26.0–34.0)
MCHC: 35.1 g/dL (ref 30.0–36.0)
MCHC: 35.4 g/dL (ref 30.0–36.0)
MCHC: 36.7 g/dL — AB (ref 30.0–36.0)
MCHC: 35.8 g/dL (ref 30.0–36.0)
MCV: 89.2 fL (ref 78.0–100.0)
MCV: 89 fL (ref 78.0–100.0)
MCV: 87.9 fL (ref 78.0–100.0)
MCV: 88.4 fL (ref 78.0–100.0)
Platelets: 143 10*3/uL — ABNORMAL LOW (ref 150–400)
Platelets: 120 K/uL — ABNORMAL LOW (ref 150–400)
Platelets: 96 10*3/uL — ABNORMAL LOW (ref 150–400)
Platelets: 106 10*3/uL — ABNORMAL LOW (ref 150–400)
RBC: 4.63 MIL/uL (ref 4.22–5.81)
RBC: 3.01 MIL/uL — ABNORMAL LOW (ref 4.22–5.81)
RBC: 3.13 MIL/uL — ABNORMAL LOW (ref 4.22–5.81)
RBC: 3.03 MIL/uL — AB (ref 4.22–5.81)
RDW: 13.4 % (ref 11.5–15.5)
RDW: 13.4 % (ref 11.5–15.5)
RDW: 13.3 % (ref 11.5–15.5)
RDW: 13.3 % (ref 11.5–15.5)
WBC: 5.4 10*3/uL (ref 4.0–10.5)
WBC: 6.6 K/uL (ref 4.0–10.5)
WBC: 6.5 10*3/uL (ref 4.0–10.5)
WBC: 8.1 10*3/uL (ref 4.0–10.5)

## 2013-02-28 LAB — POCT I-STAT 3, ART BLOOD GAS (G3+)
ACID-BASE EXCESS: 1 mmol/L (ref 0.0–2.0)
BICARBONATE: 25.2 meq/L — AB (ref 20.0–24.0)
Bicarbonate: 24.6 mEq/L — ABNORMAL HIGH (ref 20.0–24.0)
O2 Saturation: 100 %
O2 Saturation: 97 %
PCO2 ART: 38.6 mmHg (ref 35.0–45.0)
PCO2 ART: 35.7 mmHg (ref 35.0–45.0)
PO2 ART: 81 mmHg (ref 80.0–100.0)
Patient temperature: 36.4
TCO2: 26 mmol/L (ref 0–100)
TCO2: 26 mmol/L (ref 0–100)
pH, Arterial: 7.413 (ref 7.350–7.450)
pH, Arterial: 7.454 — ABNORMAL HIGH (ref 7.350–7.450)
pO2, Arterial: 434 mmHg — ABNORMAL HIGH (ref 80.0–100.0)

## 2013-02-28 LAB — PROTIME-INR
INR: 1.57 — AB (ref 0.00–1.49)
PROTHROMBIN TIME: 18.3 s — AB (ref 11.6–15.2)

## 2013-02-28 LAB — CREATININE, SERUM
Creatinine, Ser: 0.74 mg/dL (ref 0.50–1.35)
GFR calc Af Amer: >90 mL/min (ref >90)
GFR, EST NON AFRICAN AMERICAN: 83 mL/min — AB (ref >90)

## 2013-02-28 LAB — HEMOGLOBIN AND HEMATOCRIT, BLOOD
HCT: 28.2 % — ABNORMAL LOW (ref 39.0–52.0)
Hemoglobin: 10.1 g/dL — ABNORMAL LOW (ref 13.0–17.0)

## 2013-02-28 LAB — APTT
aPTT: 37 s (ref 24–37)
aPTT: 34 seconds (ref 24–37)

## 2013-02-28 LAB — GLUCOSE, CAPILLARY
GLUCOSE-CAPILLARY: 106 mg/dL — AB (ref 70–99)
GLUCOSE-CAPILLARY: 109 mg/dL — AB (ref 70–99)
GLUCOSE-CAPILLARY: 126 mg/dL — AB (ref 70–99)
GLUCOSE-CAPILLARY: 133 mg/dL — AB (ref 70–99)
Glucose-Capillary: 122 mg/dL — ABNORMAL HIGH (ref 70–99)
Glucose-Capillary: 119 mg/dL — ABNORMAL HIGH (ref 70–99)

## 2013-02-28 LAB — POCT I-STAT GLUCOSE
Glucose, Bld: 103 mg/dL — ABNORMAL HIGH (ref 70–99)
Operator id: 3402

## 2013-02-28 LAB — MAGNESIUM: Magnesium: 2.6 mg/dL — ABNORMAL HIGH (ref 1.5–2.5)

## 2013-02-28 LAB — PLATELET COUNT: PLATELETS: 101 10*3/uL — AB (ref 150–400)

## 2013-02-28 LAB — HEPARIN LEVEL (UNFRACTIONATED): Heparin Unfractionated: <0.1 IU/mL — ABNORMAL LOW (ref 0.30–0.70)

## 2013-02-28 SURGERY — CORONARY ARTERY BYPASS GRAFTING (CABG)
Anesthesia: General | Site: Chest

## 2013-02-28 MED ORDER — SODIUM CHLORIDE 0.9 % IR SOLN
Status: DC | PRN
  Administered 2013-02-28: 3000 mL

## 2013-02-28 MED ORDER — OXYCODONE HCL 5 MG PO TABS: 5.0000 mg | ORAL_TABLET | ORAL | Status: DC | PRN

## 2013-02-28 MED ORDER — MILRINONE IN DEXTROSE 20 MG/100ML IV SOLN
INTRAVENOUS | Status: DC | PRN
  Administered 2013-02-28: .3 ug/kg/min via INTRAVENOUS

## 2013-02-28 MED ORDER — MIDAZOLAM HCL 5 MG/5ML IJ SOLN
INTRAMUSCULAR | Status: DC | PRN
  Administered 2013-02-28: 5 mg via INTRAVENOUS
  Administered 2013-02-28: 3 mg via INTRAVENOUS
  Administered 2013-02-28 (×2): 1 mg via INTRAVENOUS

## 2013-02-28 MED ORDER — PHENYLEPHRINE HCL 10 MG/ML IJ SOLN
0.0000 ug/min | INTRAMUSCULAR | Status: DC
  Filled 2013-02-28: qty 2

## 2013-02-28 MED ORDER — CALCIUM CHLORIDE 10 % IV SOLN
INTRAVENOUS | Status: DC | PRN
  Administered 2013-02-28: 500 mg via INTRAVENOUS
  Administered 2013-02-28: 250 mg via INTRAVENOUS

## 2013-02-28 MED ORDER — ASPIRIN 81 MG PO CHEW: 324.0000 mg | CHEWABLE_TABLET | Freq: Every day | ORAL | Status: DC

## 2013-02-28 MED ORDER — METOPROLOL TARTRATE 12.5 MG HALF TABLET
12.5000 mg | ORAL_TABLET | Freq: Two times a day (BID) | ORAL | Status: DC
  Administered 2013-03-01 – 2013-03-02 (×2): 12.5 mg via ORAL
  Filled 2013-02-28 (×5): qty 1

## 2013-02-28 MED ORDER — VECURONIUM BROMIDE 10 MG IV SOLR
INTRAVENOUS | Status: DC | PRN
  Administered 2013-02-28: 5 mg via INTRAVENOUS
  Administered 2013-02-28: 10 mg via INTRAVENOUS
  Administered 2013-02-28: 5 mg via INTRAVENOUS

## 2013-02-28 MED ORDER — ALBUMIN HUMAN 5 % IV SOLN
250.0000 mL | INTRAVENOUS | Status: AC | PRN
  Administered 2013-02-28 – 2013-03-01 (×4): 250 mL via INTRAVENOUS
  Filled 2013-02-28 (×2): qty 250

## 2013-02-28 MED ORDER — VANCOMYCIN HCL IN DEXTROSE 1-5 GM/200ML-% IV SOLN
1000.0000 mg | Freq: Once | INTRAVENOUS | Status: AC
  Administered 2013-02-28: 1000 mg via INTRAVENOUS
  Filled 2013-02-28: qty 200

## 2013-02-28 MED ORDER — METOPROLOL TARTRATE 25 MG/10 ML ORAL SUSPENSION
12.5000 mg | Freq: Two times a day (BID) | ORAL | Status: DC
  Administered 2013-02-28: 12.5 mg
  Filled 2013-02-28 (×5): qty 5

## 2013-02-28 MED ORDER — SODIUM CHLORIDE 0.9 % IV SOLN
200.0000 ug | INTRAVENOUS | Status: DC | PRN
  Administered 2013-02-28: .3 ug/kg/h via INTRAVENOUS

## 2013-02-28 MED ORDER — POTASSIUM CHLORIDE 10 MEQ/50ML IV SOLN
10.0000 meq | INTRAVENOUS | Status: AC
  Administered 2013-02-28 – 2013-03-01 (×3): 10 meq via INTRAVENOUS

## 2013-02-28 MED ORDER — ACETAMINOPHEN 650 MG RE SUPP
650.0000 mg | Freq: Once | RECTAL | Status: AC
  Administered 2013-02-28: 650 mg via RECTAL

## 2013-02-28 MED ORDER — ONDANSETRON HCL 4 MG/2ML IJ SOLN
4.0000 mg | Freq: Four times a day (QID) | INTRAMUSCULAR | Status: DC | PRN
Start: 2013-02-28 — End: 2013-03-06
  Administered 2013-03-03: 4 mg via INTRAVENOUS
  Filled 2013-02-28: qty 2

## 2013-02-28 MED ORDER — INSULIN REGULAR HUMAN 100 UNIT/ML IJ SOLN
INTRAMUSCULAR | Status: DC
  Filled 2013-02-28: qty 1

## 2013-02-28 MED ORDER — CHLORHEXIDINE GLUCONATE CLOTH 2 % EX PADS
6.0000 | MEDICATED_PAD | Freq: Every day | CUTANEOUS | Status: AC
  Administered 2013-02-28 – 2013-03-04 (×4): 6 via TOPICAL

## 2013-02-28 MED ORDER — DOPAMINE-DEXTROSE 3.2-5 MG/ML-% IV SOLN: 0.0000 ug/kg/min | INTRAVENOUS | Status: DC

## 2013-02-28 MED ORDER — FENTANYL CITRATE 0.05 MG/ML IJ SOLN
INTRAMUSCULAR | Status: DC | PRN
  Administered 2013-02-28 (×3): 100 ug via INTRAVENOUS
  Administered 2013-02-28: 250 ug via INTRAVENOUS
  Administered 2013-02-28: 50 ug via INTRAVENOUS
  Administered 2013-02-28: 100 ug via INTRAVENOUS
  Administered 2013-02-28: 200 ug via INTRAVENOUS
  Administered 2013-02-28: 100 ug via INTRAVENOUS
  Administered 2013-02-28: 250 ug via INTRAVENOUS

## 2013-02-28 MED ORDER — NITROGLYCERIN IN D5W 200-5 MCG/ML-% IV SOLN: 0.0000 ug/min | INTRAVENOUS | Status: DC

## 2013-02-28 MED ORDER — DOCUSATE SODIUM 100 MG PO CAPS
200.0000 mg | ORAL_CAPSULE | Freq: Every day | ORAL | Status: DC
  Filled 2013-02-28 (×2): qty 2

## 2013-02-28 MED ORDER — ACETAMINOPHEN 160 MG/5ML PO SOLN
1000.0000 mg | Freq: Four times a day (QID) | ORAL | Status: DC
  Administered 2013-02-28: 1000 mg
  Filled 2013-02-28: qty 40.6

## 2013-02-28 MED ORDER — CEFAZOLIN SODIUM-DEXTROSE 2-3 GM-% IV SOLR: INTRAVENOUS | Status: DC | PRN

## 2013-02-28 MED ORDER — ROCURONIUM BROMIDE 100 MG/10ML IV SOLN
INTRAVENOUS | Status: DC | PRN
  Administered 2013-02-28: 100 mg via INTRAVENOUS

## 2013-02-28 MED ORDER — BISACODYL 5 MG PO TBEC
10.0000 mg | DELAYED_RELEASE_TABLET | Freq: Every day | ORAL | Status: DC
Start: 2013-03-01 — End: 2013-03-03
  Filled 2013-02-28: qty 2

## 2013-02-28 MED ORDER — MILRINONE IN DEXTROSE 20 MG/100ML IV SOLN
0.3000 ug/kg/min | INTRAVENOUS | Status: DC
  Administered 2013-02-28 – 2013-03-01 (×2): 0.3 ug/kg/min via INTRAVENOUS
  Filled 2013-02-28: qty 100

## 2013-02-28 MED ORDER — PHENYLEPHRINE HCL 10 MG/ML IJ SOLN
10.0000 mg | INTRAVENOUS | Status: DC | PRN
  Administered 2013-02-28: 15 ug/min via INTRAVENOUS

## 2013-02-28 MED ORDER — DEXMEDETOMIDINE HCL IN NACL 200 MCG/50ML IV SOLN
0.1000 ug/kg/h | INTRAVENOUS | Status: DC
  Administered 2013-02-28: 0.6 ug/kg/h via INTRAVENOUS
  Administered 2013-02-28: 0.7 ug/kg/h via INTRAVENOUS
  Administered 2013-03-01: 0.5 ug/kg/h via INTRAVENOUS
  Filled 2013-02-28: qty 100
  Filled 2013-02-28: qty 50

## 2013-02-28 MED ORDER — MORPHINE SULFATE 2 MG/ML IJ SOLN
1.0000 mg | INTRAMUSCULAR | Status: AC | PRN
  Administered 2013-02-28 (×2): 2 mg via INTRAVENOUS
  Filled 2013-02-28: qty 1

## 2013-02-28 MED ORDER — ACETAMINOPHEN 500 MG PO TABS
1000.0000 mg | ORAL_TABLET | Freq: Four times a day (QID) | ORAL | Status: AC
  Administered 2013-03-01 – 2013-03-03 (×9): 1000 mg via ORAL
  Administered 2013-03-04 (×4): 500 mg via ORAL
  Administered 2013-03-05: 1000 mg via ORAL
  Filled 2013-02-28 (×18): qty 2

## 2013-02-28 MED ORDER — AMIODARONE HCL IN DEXTROSE 360-4.14 MG/200ML-% IV SOLN
60.0000 mg/h | INTRAVENOUS | Status: DC
Start: 2013-02-28 — End: 2013-02-28
  Filled 2013-02-28: qty 200

## 2013-02-28 MED ORDER — LACTATED RINGERS IV SOLN
INTRAVENOUS | Status: DC | PRN
  Administered 2013-02-28 (×2): via INTRAVENOUS

## 2013-02-28 MED ORDER — 0.9 % SODIUM CHLORIDE (POUR BTL) OPTIME
TOPICAL | Status: DC | PRN
  Administered 2013-02-28: 6000 mL

## 2013-02-28 MED ORDER — MAGNESIUM SULFATE 40 MG/ML IJ SOLN
4.0000 g | Freq: Once | INTRAMUSCULAR | Status: AC
  Administered 2013-02-28: 4 g via INTRAVENOUS
  Filled 2013-02-28: qty 100

## 2013-02-28 MED ORDER — SODIUM CHLORIDE 0.9 % IV SOLN
10.0000 g | INTRAVENOUS | Status: DC | PRN
  Administered 2013-02-28: 5 g/h via INTRAVENOUS

## 2013-02-28 MED ORDER — ALBUMIN HUMAN 5 % IV SOLN
INTRAVENOUS | Status: DC | PRN
  Administered 2013-02-28 (×2): via INTRAVENOUS

## 2013-02-28 MED ORDER — PANTOPRAZOLE SODIUM 40 MG PO TBEC
40.0000 mg | DELAYED_RELEASE_TABLET | Freq: Every day | ORAL | Status: DC
  Administered 2013-03-02 – 2013-03-06 (×5): 40 mg via ORAL
  Filled 2013-02-28 (×5): qty 1

## 2013-02-28 MED ORDER — POTASSIUM CHLORIDE 10 MEQ/50ML IV SOLN: 10.0000 meq | INTRAVENOUS | Status: AC

## 2013-02-28 MED ORDER — DEXTROSE 5 % IV SOLN
1.5000 g | Freq: Two times a day (BID) | INTRAVENOUS | Status: AC
  Administered 2013-02-28 – 2013-03-02 (×4): 1.5 g via INTRAVENOUS
  Filled 2013-02-28 (×4): qty 1.5

## 2013-02-28 MED ORDER — MIDAZOLAM HCL 2 MG/2ML IJ SOLN
2.0000 mg | INTRAMUSCULAR | Status: DC | PRN
  Administered 2013-02-28 – 2013-03-01 (×3): 2 mg via INTRAVENOUS
  Filled 2013-02-28 (×3): qty 2

## 2013-02-28 MED ORDER — LIDOCAINE HCL (CARDIAC) 20 MG/ML IV SOLN
INTRAVENOUS | Status: DC | PRN
  Administered 2013-02-28: 100 mg via INTRAVENOUS

## 2013-02-28 MED ORDER — INSULIN REGULAR BOLUS VIA INFUSION
0.0000 [IU] | Freq: Three times a day (TID) | INTRAVENOUS | Status: DC
  Filled 2013-02-28: qty 10

## 2013-02-28 MED ORDER — LACTATED RINGERS IV SOLN
INTRAVENOUS | Status: DC | PRN
  Administered 2013-02-28: 08:00:00 via INTRAVENOUS

## 2013-02-28 MED ORDER — MILRINONE IN DEXTROSE 20 MG/100ML IV SOLN
0.2500 ug/kg/min | INTRAVENOUS | Status: DC
  Filled 2013-02-28: qty 100

## 2013-02-28 MED ORDER — SODIUM CHLORIDE 0.9 % IV SOLN: INTRAVENOUS | Status: AC

## 2013-02-28 MED ORDER — ACETAMINOPHEN 160 MG/5ML PO SOLN: 650.0000 mg | Freq: Once | ORAL | Status: AC

## 2013-02-28 MED ORDER — SODIUM CHLORIDE 0.9 % IV SOLN
100.0000 [IU] | INTRAVENOUS | Status: DC | PRN
  Administered 2013-02-28: 1.1 [IU]/h via INTRAVENOUS

## 2013-02-28 MED ORDER — SODIUM CHLORIDE 0.9 % IV SOLN: 250.0000 mL | INTRAVENOUS | Status: DC

## 2013-02-28 MED ORDER — SODIUM CHLORIDE 0.9 % IJ SOLN
3.0000 mL | INTRAMUSCULAR | Status: DC | PRN
  Administered 2013-03-02: 3 mL via INTRAVENOUS

## 2013-02-28 MED ORDER — SODIUM CHLORIDE 0.9 % IJ SOLN
OROMUCOSAL | Status: DC | PRN
  Administered 2013-02-28 (×3): via TOPICAL

## 2013-02-28 MED ORDER — SODIUM CHLORIDE 0.45 % IV SOLN
INTRAVENOUS | Status: DC
  Administered 2013-02-28 (×2): via INTRAVENOUS

## 2013-02-28 MED ORDER — LACTATED RINGERS IV SOLN: 500.0000 mL | Freq: Once | INTRAVENOUS | Status: AC | PRN

## 2013-02-28 MED ORDER — LACTATED RINGERS IV SOLN: INTRAVENOUS | Status: DC

## 2013-02-28 MED ORDER — MUPIROCIN 2 % EX OINT
1.0000 "application " | TOPICAL_OINTMENT | Freq: Two times a day (BID) | CUTANEOUS | Status: AC
  Administered 2013-02-28 – 2013-03-05 (×10): 1 via NASAL
  Filled 2013-02-28: qty 22

## 2013-02-28 MED ORDER — PROPOFOL 10 MG/ML IV BOLUS
INTRAVENOUS | Status: DC | PRN
  Administered 2013-02-28: 50 mg via INTRAVENOUS

## 2013-02-28 MED ORDER — AMIODARONE LOAD VIA INFUSION
150.0000 mg | Freq: Once | INTRAVENOUS | Status: DC
Start: 2013-02-28 — End: 2013-02-28
  Filled 2013-02-28: qty 83.34

## 2013-02-28 MED ORDER — ASPIRIN EC 325 MG PO TBEC
325.0000 mg | DELAYED_RELEASE_TABLET | Freq: Every day | ORAL | Status: DC
  Administered 2013-03-01 – 2013-03-02 (×2): 325 mg via ORAL
  Filled 2013-02-28 (×2): qty 1

## 2013-02-28 MED ORDER — HEPARIN SODIUM (PORCINE) 1000 UNIT/ML IJ SOLN
INTRAMUSCULAR | Status: DC | PRN
  Administered 2013-02-28: 3 mL via INTRAVENOUS
  Administered 2013-02-28: 27 mL via INTRAVENOUS

## 2013-02-28 MED ORDER — LACTATED RINGERS IV SOLN
INTRAVENOUS | Status: DC | PRN
  Administered 2013-02-28 (×2): via INTRAVENOUS

## 2013-02-28 MED ORDER — METOPROLOL TARTRATE 1 MG/ML IV SOLN
2.5000 mg | INTRAVENOUS | Status: DC | PRN
  Administered 2013-03-02: 2.5 mg via INTRAVENOUS

## 2013-02-28 MED ORDER — PROTAMINE SULFATE 10 MG/ML IV SOLN
INTRAVENOUS | Status: DC | PRN
  Administered 2013-02-28: 50 mg via INTRAVENOUS
  Administered 2013-02-28: 230 mg via INTRAVENOUS

## 2013-02-28 MED ORDER — FAMOTIDINE IN NACL 20-0.9 MG/50ML-% IV SOLN
20.0000 mg | Freq: Two times a day (BID) | INTRAVENOUS | Status: AC
  Administered 2013-02-28 – 2013-03-01 (×2): 20 mg via INTRAVENOUS
  Filled 2013-02-28 (×2): qty 50

## 2013-02-28 MED ORDER — SODIUM CHLORIDE 0.9 % IJ SOLN
3.0000 mL | Freq: Two times a day (BID) | INTRAMUSCULAR | Status: DC
  Administered 2013-03-01 – 2013-03-05 (×4): 3 mL via INTRAVENOUS

## 2013-02-28 MED ORDER — MORPHINE SULFATE 2 MG/ML IJ SOLN
2.0000 mg | INTRAMUSCULAR | Status: DC | PRN
  Administered 2013-03-01: 2 mg via INTRAVENOUS
  Filled 2013-02-28 (×2): qty 1

## 2013-02-28 MED ORDER — BISACODYL 10 MG RE SUPP: 10.0000 mg | Freq: Every day | RECTAL | Status: DC

## 2013-02-28 SURGICAL SUPPLY — 162 items
ADAPTER CARDIO PERF ANTE/RETRO (ADAPTER) ×3 IMPLANT
APPLIER CLIP 9.375 MED OPEN (MISCELLANEOUS)
APPLIER CLIP 9.375 SM OPEN (CLIP)
ATRICLIP EXCLUSION 40 STD HAND (Clip) ×3 IMPLANT
ATTRACTOMAT 16X20 MAGNETIC DRP (DRAPES) ×6 IMPLANT
BAG DECANTER FOR FLEXI CONT (MISCELLANEOUS) ×6 IMPLANT
BANDAGE ELASTIC 4 VELCRO ST LF (GAUZE/BANDAGES/DRESSINGS) ×3 IMPLANT
BANDAGE ELASTIC 6 VELCRO ST LF (GAUZE/BANDAGES/DRESSINGS) ×3 IMPLANT
BANDAGE GAUZE ELAST BULKY 4 IN (GAUZE/BANDAGES/DRESSINGS) ×3 IMPLANT
BASKET HEART (ORDER IN 25'S) (MISCELLANEOUS) ×1
BASKET HEART (ORDER IN 25S) (MISCELLANEOUS) ×2 IMPLANT
BENZOIN TINCTURE PRP APPL 2/3 (GAUZE/BANDAGES/DRESSINGS) ×3 IMPLANT
BLADE STERNUM SYSTEM 6 (BLADE) ×6 IMPLANT
BLADE SURG 11 STRL SS (BLADE) ×9 IMPLANT
BLADE SURG ROTATE 9660 (MISCELLANEOUS) IMPLANT
BNDG GAUZE ELAST 4 BULKY (GAUZE/BANDAGES/DRESSINGS) ×3 IMPLANT
CANISTER SUCTION 2500CC (MISCELLANEOUS) ×6 IMPLANT
CANN PRFSN 3/8X14X24FR PCFC (MISCELLANEOUS)
CANN PRFSN 3/8XCNCT ST RT ANG (MISCELLANEOUS)
CANNULA EZ GLIDE AORTIC 21FR (CANNULA) ×3 IMPLANT
CANNULA FEM VENOUS REMOTE 22FR (CANNULA) ×3 IMPLANT
CANNULA GUNDRY RCSP 15FR (MISCELLANEOUS) ×6 IMPLANT
CANNULA PRFSN 3/8X14X24FR PCFC (MISCELLANEOUS) IMPLANT
CANNULA PRFSN 3/8XCNCT RT ANG (MISCELLANEOUS) IMPLANT
CANNULA VEN MTL TIP RT (MISCELLANEOUS)
CANNULA VENNOUS METAL TIP 20FR (CANNULA) ×3 IMPLANT
CANNULA VENOUS LOW PROF 34X46 (CANNULA) ×3 IMPLANT
CANNULA VRC MALB SNGL STG 24FR (MISCELLANEOUS) ×2 IMPLANT
CARDIAC SUCTION (MISCELLANEOUS) ×3 IMPLANT
CATH CPB KIT OWEN (MISCELLANEOUS) ×3 IMPLANT
CATH THORACIC 28FR (CATHETERS) IMPLANT
CATH THORACIC 28FR RT ANG (CATHETERS) IMPLANT
CATH THORACIC 36FR (CATHETERS) ×3 IMPLANT
CATH THORACIC 36FR RT ANG (CATHETERS) ×3 IMPLANT
CLAMP ISOLATOR SYNERGY LG (MISCELLANEOUS) ×3 IMPLANT
CLAMP OLL ABLATION (MISCELLANEOUS) ×3 IMPLANT
CLIP APPLIE 9.375 MED OPEN (MISCELLANEOUS) IMPLANT
CLIP APPLIE 9.375 SM OPEN (CLIP) IMPLANT
CLIP FOGARTY SPRING 6M (CLIP) IMPLANT
CLIP RETRACTION 3.0MM CORONARY (MISCELLANEOUS) ×3 IMPLANT
CLIP TI MEDIUM 24 (CLIP) IMPLANT
CLIP TI WIDE RED SMALL 24 (CLIP) IMPLANT
CONN 1/2X1/2X1/2  BEN (MISCELLANEOUS) ×1
CONN 1/2X1/2X1/2 BEN (MISCELLANEOUS) ×2 IMPLANT
CONN 3/8X1/2 ST GISH (MISCELLANEOUS) ×6 IMPLANT
CONN ST 1/4X3/8  BEN (MISCELLANEOUS) ×2
CONN ST 1/4X3/8 BEN (MISCELLANEOUS) ×4 IMPLANT
CONN Y 3/8X3/8X3/8  BEN (MISCELLANEOUS)
CONN Y 3/8X3/8X3/8 BEN (MISCELLANEOUS) IMPLANT
CONNECTOR 1/2X3/8X1/2 3 WAY (MISCELLANEOUS) ×1
CONNECTOR 1/2X3/8X1/2 3WAY (MISCELLANEOUS) ×2 IMPLANT
CONT SPEC 4OZ CLIKSEAL STRL BL (MISCELLANEOUS) ×3 IMPLANT
COUNTER NEEDLE 20 DBL MAG RED (NEEDLE) ×3 IMPLANT
COVER SURGICAL LIGHT HANDLE (MISCELLANEOUS) ×6 IMPLANT
CRADLE DONUT ADULT HEAD (MISCELLANEOUS) ×6 IMPLANT
DERMABOND ADVANCED (GAUZE/BANDAGES/DRESSINGS) ×1
DERMABOND ADVANCED .7 DNX12 (GAUZE/BANDAGES/DRESSINGS) ×2 IMPLANT
DEVICE SUT CK QUICK LOAD INDV (Prosthesis & Implant Heart) ×6 IMPLANT
DEVICE SUT CK QUICK LOAD MINI (Prosthesis & Implant Heart) ×3 IMPLANT
DRAIN CHANNEL 32F RND 10.7 FF (WOUND CARE) ×9 IMPLANT
DRAPE CARDIOVASCULAR INCISE (DRAPES) ×1
DRAPE SLUSH/WARMER DISC (DRAPES) ×6 IMPLANT
DRAPE SRG 135X102X78XABS (DRAPES) ×2 IMPLANT
DRSG COVADERM 4X14 (GAUZE/BANDAGES/DRESSINGS) ×3 IMPLANT
ELECT REM PT RETURN 9FT ADLT (ELECTROSURGICAL) ×6
ELECTRODE REM PT RTRN 9FT ADLT (ELECTROSURGICAL) ×4 IMPLANT
GLOVE BIO SURGEON STRL SZ 6 (GLOVE) ×6 IMPLANT
GLOVE BIO SURGEON STRL SZ 6.5 (GLOVE) ×6 IMPLANT
GLOVE BIO SURGEON STRL SZ7 (GLOVE) IMPLANT
GLOVE BIO SURGEON STRL SZ7.5 (GLOVE) IMPLANT
GLOVE BIOGEL PI IND STRL 6 (GLOVE) IMPLANT
GLOVE BIOGEL PI IND STRL 6.5 (GLOVE) ×4 IMPLANT
GLOVE BIOGEL PI IND STRL 7.0 (GLOVE) ×8 IMPLANT
GLOVE BIOGEL PI INDICATOR 6 (GLOVE)
GLOVE BIOGEL PI INDICATOR 6.5 (GLOVE) ×2
GLOVE BIOGEL PI INDICATOR 7.0 (GLOVE) ×4
GLOVE EUDERMIC 7 POWDERFREE (GLOVE) IMPLANT
GLOVE ORTHO TXT STRL SZ7.5 (GLOVE) ×12 IMPLANT
GOWN STRL NON-REIN LRG LVL3 (GOWN DISPOSABLE) ×24 IMPLANT
HEMOSTAT POWDER SURGIFOAM 1G (HEMOSTASIS) ×9 IMPLANT
INSERT FOGARTY 61MM (MISCELLANEOUS) IMPLANT
INSERT FOGARTY XLG (MISCELLANEOUS) ×9 IMPLANT
IV NS IRRIG 3000ML ARTHROMATIC (IV SOLUTION) ×3 IMPLANT
KIT BASIN OR (CUSTOM PROCEDURE TRAY) ×6 IMPLANT
KIT DILATOR VASC 18G NDL (KITS) ×3 IMPLANT
KIT DRAINAGE VACCUM ASSIST (KITS) ×3 IMPLANT
KIT ROOM TURNOVER OR (KITS) ×6 IMPLANT
KIT SUCTION CATH 14FR (SUCTIONS) ×24 IMPLANT
KIT SUT CK MINI COMBO 4X17 (Prosthesis & Implant Heart) ×3 IMPLANT
KIT VASOVIEW W/TROCAR VH 2000 (KITS) ×3 IMPLANT
LEAD PACING MYOCARDI (MISCELLANEOUS) ×3 IMPLANT
LINE VENT (MISCELLANEOUS) ×3 IMPLANT
LOOP VESSEL SUPERMAXI WHITE (MISCELLANEOUS) ×6 IMPLANT
MARKER GRAFT CORONARY BYPASS (MISCELLANEOUS) ×9 IMPLANT
NS IRRIG 1000ML POUR BTL (IV SOLUTION) ×33 IMPLANT
PACK OPEN HEART (CUSTOM PROCEDURE TRAY) ×3 IMPLANT
PAD ARMBOARD 7.5X6 YLW CONV (MISCELLANEOUS) ×9 IMPLANT
PAD ELECT DEFIB RADIOL ZOLL (MISCELLANEOUS) ×3 IMPLANT
PENCIL BUTTON HOLSTER BLD 10FT (ELECTRODE) ×3 IMPLANT
PROBE CRYO2-ABLATION MALLABLE (MISCELLANEOUS) ×3 IMPLANT
PUNCH AORTIC ROTATE 4.0MM (MISCELLANEOUS) ×3 IMPLANT
PUNCH AORTIC ROTATE 4.5MM 8IN (MISCELLANEOUS) IMPLANT
PUNCH AORTIC ROTATE 5MM 8IN (MISCELLANEOUS) IMPLANT
RING MITRAL MEMO 3D 30MM SMD30 (Prosthesis & Implant Heart) ×3 IMPLANT
SET CARDIOPLEGIA MPS 5001102 (MISCELLANEOUS) ×3 IMPLANT
SET IRRIG TUBING LAPAROSCOPIC (IRRIGATION / IRRIGATOR) ×3 IMPLANT
SOLUTION ANTI FOG 6CC (MISCELLANEOUS) ×3 IMPLANT
SPONGE GAUZE 4X4 12PLY (GAUZE/BANDAGES/DRESSINGS) ×3 IMPLANT
SPONGE GAUZE 4X4 12PLY STER LF (GAUZE/BANDAGES/DRESSINGS) ×3 IMPLANT
SPONGE LAP 18X18 X RAY DECT (DISPOSABLE) ×6 IMPLANT
SPONGE LAP 4X18 X RAY DECT (DISPOSABLE) ×3 IMPLANT
SUCKER INTRACARDIAC WEIGHTED (SUCKER) ×6 IMPLANT
SUT BONE WAX W31G (SUTURE) ×6 IMPLANT
SUT ETHIBOND (SUTURE) ×12 IMPLANT
SUT ETHIBOND 2 0 SH (SUTURE) ×2
SUT ETHIBOND 2 0 SH 36X2 (SUTURE) ×4 IMPLANT
SUT ETHIBOND 2-0 RB-1 WHT (SUTURE) ×9 IMPLANT
SUT ETHIBOND 3 0 SH 1 (SUTURE) ×3 IMPLANT
SUT ETHIBOND X763 2 0 SH 1 (SUTURE) ×12 IMPLANT
SUT GORETEX CV-5THC-13 36IN (SUTURE) ×33 IMPLANT
SUT MNCRL AB 3-0 PS2 18 (SUTURE) ×12 IMPLANT
SUT MNCRL AB 4-0 PS2 18 (SUTURE) IMPLANT
SUT PDS AB 1 CTX 36 (SUTURE) ×12 IMPLANT
SUT PROLENE 2 0 SH DA (SUTURE) IMPLANT
SUT PROLENE 3 0 SH 1 (SUTURE) ×3 IMPLANT
SUT PROLENE 3 0 SH DA (SUTURE) ×21 IMPLANT
SUT PROLENE 3 0 SH1 36 (SUTURE) IMPLANT
SUT PROLENE 4 0 RB 1 (SUTURE) ×6
SUT PROLENE 4 0 SH DA (SUTURE) ×9 IMPLANT
SUT PROLENE 4-0 RB1 .5 CRCL 36 (SUTURE) ×12 IMPLANT
SUT PROLENE 5 0 C 1 36 (SUTURE) IMPLANT
SUT PROLENE 6 0 C 1 30 (SUTURE) ×15 IMPLANT
SUT PROLENE 7.0 RB 3 (SUTURE) ×9 IMPLANT
SUT PROLENE 8 0 BV175 6 (SUTURE) IMPLANT
SUT PROLENE BLUE 7 0 (SUTURE) ×3 IMPLANT
SUT PROLENE POLY MONO (SUTURE) IMPLANT
SUT SILK  1 MH (SUTURE) ×4
SUT SILK 1 MH (SUTURE) ×8 IMPLANT
SUT STEEL 6MS V (SUTURE) IMPLANT
SUT STEEL STERNAL CCS#1 18IN (SUTURE) IMPLANT
SUT STEEL SZ 6 DBL 3X14 BALL (SUTURE) IMPLANT
SUT VIC AB 1 CTX 36 (SUTURE)
SUT VIC AB 1 CTX36XBRD ANBCTR (SUTURE) IMPLANT
SUT VIC AB 2-0 CT1 27 (SUTURE)
SUT VIC AB 2-0 CT1 TAPERPNT 27 (SUTURE) IMPLANT
SUT VIC AB 2-0 CTX 27 (SUTURE) IMPLANT
SUT VIC AB 3-0 SH 27 (SUTURE)
SUT VIC AB 3-0 SH 27X BRD (SUTURE) IMPLANT
SUT VIC AB 3-0 X1 27 (SUTURE) IMPLANT
SUT VICRYL 4-0 PS2 18IN ABS (SUTURE) IMPLANT
SUTURE E-PAK OPEN HEART (SUTURE) ×3 IMPLANT
SYS ATRICLIP LAA EXCLUSION 45 (CLIP) IMPLANT
SYSTEM SAHARA CHEST DRAIN ATS (WOUND CARE) ×6 IMPLANT
TAPE CLOTH SURG 4X10 WHT LF (GAUZE/BANDAGES/DRESSINGS) ×3 IMPLANT
TAPE PAPER 2X10 WHT MICROPORE (GAUZE/BANDAGES/DRESSINGS) ×3 IMPLANT
TOWEL OR 17X24 6PK STRL BLUE (TOWEL DISPOSABLE) ×12 IMPLANT
TOWEL OR 17X26 10 PK STRL BLUE (TOWEL DISPOSABLE) ×12 IMPLANT
TRAY FOLEY IC TEMP SENS 14FR (CATHETERS) ×6 IMPLANT
TUBING INSUFFLATION 10FT LAP (TUBING) ×3 IMPLANT
UNDERPAD 30X30 INCONTINENT (UNDERPADS AND DIAPERS) ×3 IMPLANT
VRC MALLEABLE SINGLE STG 24FR (MISCELLANEOUS) ×3
WATER STERILE IRR 1000ML POUR (IV SOLUTION) ×6 IMPLANT

## 2013-02-28 NOTE — Transfer of Care (Signed)
Immediate Anesthesia Transfer of Care Note  Patient: [name].  Procedure(s) Performed: Procedure(s) with comments: CORONARY ARTERY BYPASS GRAFTING (CABG) (N/A) - CABG x 2,  using left internal mammary artery and right leg saphenous vein harvested endoscopically INTRAOPERATIVE TRANSESOPHAGEAL ECHOCARDIOGRAM (N/A) MAZE (N/A)  Patient Location: SICU  Anesthesia Type:General  Level of Consciousness: Patient remains intubated per anesthesia plan  Airway & Oxygen Therapy: Patient remains intubated per anesthesia plan  Post-op Assessment: Report given to PACU RN  Post vital signs: Reviewed and stable  Complications: No apparent anesthesia complications

## 2013-02-28 NOTE — Preoperative (Signed)
Beta Blockers   Reason not to administer Beta Blockers  metoprolol 12.5 am

## 2013-02-28 NOTE — Progress Notes (Signed)
Attempting to rapid wean Pt post-op CABG/Maze/ MV ring , precedex off at 2030 and verbal queuing provided, Pt became agitated thrashing head, ventilator asynchrony and increase of BP/heart rate and peak airway pressures. Reassurance provided and attempted pain control with MSO4 2mg  IV PRN given. Pt continues to fight ventilator although being able to [facility]. Precedex returned to a rate of 0.4mg  and PRN versed IV given for agitation. Will attempt for vent weaning in 1-2 hours as Pt tolerates.

## 2013-02-28 NOTE — Progress Notes (Signed)
Echocardiogram Echocardiogram Transesophageal has been performed.  Joelene Millin 02/28/2013, 9:49 AM

## 2013-02-28 NOTE — Anesthesia Preprocedure Evaluation (Signed)
Anesthesia Evaluation  Patient identified by MRN, date of birth, ID band Patient awake    Reviewed: Allergy & Precautions, H&P , NPO status , Patient's Chart, lab work & pertinent test results, reviewed documented beta blocker date and time   History of Anesthesia Complications Negative for: history of anesthetic complications  Airway Mallampati: II TM Distance: >3 FB Neck ROM: Full    Dental  (+) Missing and Teeth Intact   Pulmonary neg pulmonary ROS,    Pulmonary exam normal       Cardiovascular hypertension, Pt. on medications and Pt. on home beta blockers + angina + CAD + dysrhythmias Atrial Fibrillation Rhythm:Irregular Rate:Normal - Systolic murmurs and - Diastolic murmurs    Neuro/Psych negative neurological ROS  negative psych ROS   GI/Hepatic Neg liver ROS, GERD-  Controlled,  Endo/Other  Hypothyroidism   Renal/GU negative Renal ROS   H/O urinary retention post op (hernia surgery). Unaware of BPH    Musculoskeletal   Abdominal Normal abdominal exam  (+)   Peds  Hematology negative hematology ROS (+)   Anesthesia Other Findings   Reproductive/Obstetrics                           Anesthesia Physical Anesthesia Plan  ASA: IV  Anesthesia Plan: General   Post-op Pain Management:    Induction: Intravenous  Airway Management Planned: Oral ETT  Additional Equipment: Arterial line, 3D TEE, CVP, PA Cath and Ultrasound Guidance Line Placement  Intra-op Plan:   Post-operative Plan: Post-operative intubation/ventilation  Informed Consent: I have reviewed the patients History and Physical, chart, labs and discussed the procedure including the risks, benefits and alternatives for the proposed anesthesia with the patient or authorized representative who has indicated his/her understanding and acceptance.   Dental advisory given  Plan Discussed with: CRNA and Surgeon  Anesthesia  Plan Comments:         Anesthesia Quick Evaluation

## 2013-02-28 NOTE — OR Nursing (Signed)
First call to SICU at 1453, Second call to SICU at [facility]

## 2013-02-28 NOTE — Op Note (Addendum)
CARDIOTHORACIC SURGERY OPERATIVE NOTE  Date of Procedure:  02/28/2013  Preoperative Diagnosis:   Severe 3-vessel Coronary Artery Disease with New-onset Angina Pectoris  Chronic Persistent Atrial Fibrillation  Mild Mitral Regurgitation  Mild Aortic Insufficiency  Postoperative Diagnosis:   Severe 3-vessel Coronary Artery Disease with New-onset Angina Pectoris  Chronic Persistent Atrial Fibrillation  Moderate-severe Mitral Regurgitation  Mild-moderate Aortic Insufficiency  Procedure:   Coronary Artery Bypass Grafting x 2  Left Internal Mammary Artery to Distal Left Anterior Descending Coronary Artery Saphenous Vein Graft to First Obtuse Marginal Branch of Left Circumflex Coronary Artery Endoscopic Vein Harvest from Right Thigh   Mitral Valve Repair  Complex valvuloplasty including triangular resection of flail posterior leaflet (P2)  Plication of prolapsing P3 portion of posterior leaflet  Sorin Memo 3D ring annuloplasty  (size 57mm, catalog #[id number], serial #[id number])   Maze Procedure   complete bilateral atrial lesion set using bipolar radiofrequency and cryothermy ablation  clipping of left atrial appendage (Atriclip size 84mm)    Surgeon: Valentina Gu. Roxy Manns, MD  Assistant: Suzzanne Cloud, PA-C and John Giovanni, PA-C  Anesthesia: Laurie Panda, MD  Operative Findings: Normal LV systolic function  Fibroelastic deficiency type degenerative disease of the mitral valve with small flail segment of posterior leaflet (P2)  Type II dysfunction with moderate-severe (3+) mitral regurgitation  Mild-moderate (2+) aortic insufficiency  Good quality LIMA conduit for grafting  Good quality SVG conduit for grafting  Distal right coronary and posterior descending coronary chronically occluded and too diseased for grafting  Intramyocardial left anterior descending coronary artery  No residual mitral regurgitation after successful valve repair            BRIEF CLINICAL NOTE  AND INDICATIONS FOR SURGERY  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 The patient apparently was first told he had atrial fibrillation many years ago by Dr. Joni Fears. Patient has always resisted anticoagulation treatment with Coumadin for a variety of reasons including a history of frequent falls. In 2012 the patient was hospitalized with along with rapid atrial fibrillation. At that time the patient had left shoulder pain but was not aware of irregular heart rhythms. The patient was seen this past October for atrial fibrillation by Dr. Harrington Challenger. Other than feeling chronic fatigue with intermittent left shoulder pain, the patient did not seem to be symptomatic. The patient remained reluctant to consider long-term anticoagulation therapy to decrease his risk of stroke.  The patient has remained reasonably active physically for a gentleman his age although he does walk using a cane and still has problems with balance. This past weekend he was performing more strenuous exertion while he was working around the house, lifting and unpacking boxes. He develop chest discomfort described as tightness and heaviness radiating across his chest to left shoulder. Symptoms improved with rest. Over the next 48 hours the patient had recurrent symptoms with exertion, ultimately prompting him to be admitted to [facility]. The patient ruled out for acute myocardial infarction based on serial cardiac enzymes. He has not had any recurrent chest pain since admission. He underwent diagnostic cardiac catheterization earlier today by Dr. Ellyn Hack demonstrating the presence of severe three-vessel coronary artery disease with preserved left ventricular systolic function. Cardiothoracic surgical consultation was requested.  A  transthoracic echocardiogram was performed the following day and felt to demonstrate mild aortic insufficiency and mild mitral regurgitation, although  sound transmission was notably poor.  The patient has been seen in consultation and counseled at length regarding the indications, risks and potential benefits of surgery.  All questions have been answered, and the patient provides full informed consent for the operation as described.      DETAILS OF THE OPERATIVE PROCEDURE  Preparation:  The patient is brought to the operating room on the above mentioned date and central monitoring was established by the anesthesia team including placement of Swan-Ganz catheter and radial arterial line. The patient is placed in the supine position on the operating table.  Intravenous antibiotics are administered. General endotracheal anesthesia is induced uneventfully. A Foley catheter is placed.  Baseline transesophageal echocardiogram was performed.  Findings were notable for normal left ventricular size and systolic function. The heart was somewhat rotated in the chest and echocardiography was felt to be challenging. However, transesophageal echocardiogram clearly demonstrated the presence of significant mitral valve prolapse with moderate to severe mitral regurgitation. There was a central jet of mitral regurgitation that filled most of the left atrium, and a second eccentric jet that coursed anteriorly away from a segment of severe prolapse involving the middle scallop (P2) of the posterior leaflet. There was some calcification in the annulus. The severity of mitral regurgitation was quantified using PISA with the vena contracta width ranging between 0.6 and 0.9. The severity of mitral regurgitation was graded 3+, although it seemed to wax and wane a little bit given the patient's irregular heart rhythm in atrial fibrillation. There was no clear flow reversal in the pulmonary veins although there was systolic  blunting. There was mild to moderate aortic insufficiency. The aortic valve is tricuspid and all 3 leaflets moved well.  The patient's chest, abdomen, both groins, and both lower extremities are prepared and draped in a sterile manner. A time out procedure is performed.   Surgical Approach and Conduit Harvest:  A median sternotomy incision was performed and the left internal mammary artery is dissected from the chest wall and prepared for bypass grafting. The left internal mammary artery is notably good quality conduit. Simultaneously, saphenous vein is obtained from the patient's right thigh using endoscopic vein harvest technique. The saphenous vein is notably good quality conduit. After removal of the saphenous vein, the small surgical incisions in the lower extremity are closed with absorbable suture. Following systemic heparinization, the left internal mammary artery was transected distally noted to have excellent flow.    Extracorporeal Cardiopulmonary Bypass and Myocardial Protection:  The pericardium is opened. The ascending aorta is mildly dilated but otherwise normal in appearance.  The right common femoral vein is cannulated using the Seldinger technique and a guidewire advanced into the right atrium using TEE guidance.  The patient is heparinized systemically and the femoral vein cannulated using a 22 Fr long femoral venous cannula.  The ascending aorta is cannulated for cardiopulmonary bypass.  Adequate heparinization is verified.   A retrograde cardioplegia cannula is placed through the right atrium into the coronary sinus.  The entire pre-bypass portion of the operation was notable for stable hemodynamics.  Cardiopulmonary bypass was begun and the surface of the heart is inspected.  Distal target vessels are selected for coronary artery bypass grafting. A second venous cannula is placed directly into the superior vena cava.  A cardioplegia cannula is placed in the ascending aorta.  A  temperature probe was placed in the interventricular septum.  The patient is cooled to 32C systemic temperature.  The aortic cross clamp is applied  and cold blood cardioplegia is delivered initially in an antegrade fashion through the aortic root.   Supplemental cardioplegia is given retrograde through the coronary sinus catheter.  Iced saline slush is applied for topical hypothermia.  The initial cardioplegic arrest is rapid with early diastolic arrest.  Repeat doses of cardioplegia are administered intermittently throughout the entire cross clamp portion of the operation through the aortic root and through the coronary sinus catheter in order to maintain completely flat electrocardiogram and septal myocardial temperature below 15C.  Myocardial protection was felt to be excellent.   Coronary Artery Bypass Grafting:   The first obtuse marginal branch of the left circumflex coronary artery was grafted using a reversed saphenous vein graft in an end-to-side fashion.  At the site of distal anastomosis the target vessel was good quality and measured approximately 2.0 mm in diameter.  The distal left anterior coronary artery was grafted with the left internal mammary artery in an end-to-side fashion.  The left internal mammary artery was notably intramyocardial throughout most of its course. Subsequently, the distal anastomosis was placed quite far distal towards the apex.  At the site of distal anastomosis the target vessel was good quality and measured approximately 1.3 mm in diameter.  Of note, the distal right coronary artery and the posterior descending coronary artery were chronically occluded. There was no suitable site for grafting in the distal right coronary circulation.     Maze Procedure (left atrial lesion set):  The AtriCure Synergy bipolar radiofrequency ablation clamp is used for all radiofrequency ablation lesions for the maze procedure.  The Atricure CryoICE nitrous oxide cryothermy  system is utilized for all cryothermy ablation lesions.   The heart is retracted towards the surgeon's side and the left sided pulmonary veins exposed.  An elliptical ablation lesion is created around the base of the left sided pulmonary veins.  A similar elliptical lesion was created around the base of the left atrial appendage.  The left atrial appendage was obliterated using an Atricure left atrial appendage clip (Atriclip, size 75mm).  The heart was replaced into the pericardial sac.  A left atriotomy incision was performed through the interatrial groove and extended partially across the back wall of the left atrium after opening the oblique sinus inferiorly.  The floor of the left atrium and the mitral valve were exposed using a self-retaining retractor.  Of note, exposure of the left atrium and in particular the mitral valve was technically challenging do to the rotated orientation of the heart.  An ablation lesion was placed around the right sided pulmonary veins using the bipolar clamp with one limb of the clamp along the endocardial surface and one along the epicardial surface posteriorly.  A bipolar ablation lesion was placed across the dome of the left atrium from the cephalad apex of the atriotomy incision to reach the cephalad apex of the elliptical lesion around the left sided pulmonary veins.  A similar bipolar lesion was placed across the back wall of the left atrium from the caudad apex of the atriotomy incision to reach the caudad apex of the elliptical lesion around the left sided pulmonary veins, thereby completing a box.  Finally another bipolar lesion was placed across the back wall of the left atrium from the caudad apex of the atriotomy incision towards the posterior mitral valve annulus.  This lesion was completed along the endocardial surface onto the posterior mitral annulus with a 3 minute duration cryothermy lesion, followed by a second cryothermy lesion along the  posterior  epicardial surface of the left atrium to the coronary sinus.  This completes the entire left side lesion set of the Cox maze procedure.   Mitral Valve Repair:  The mitral valve was inspected and notable for fibroelastic deficiency type degenerative disease. There was actually a small flail segment of P2 with 2 ruptured primary cords. There was prolapse without flail involving P3. There was moderate calcification in the posterior annulus and some sclerosis throughout both the anterior and posterior leaflets.  Interrupted 2-0 Ethibond sutures are placed circumferentially around the mitral annulus.  The flail segment of the middle scallop (P2) of the posterior leaflet is corrected using a small triangular resection. The intervening  defect in the leaflet is closed using interrupted everting simple CV 5 Gore-Tex sutures. There remains some prolapse without flail segment it P3. This was corrected using several interrupted everting CV 5 Gore-Tex plication sutures placed at the adjacent clefts between P2 and P3, and between P3 and the posterior commissure.  The valve was sized to accept a 30 mm annuloplasty ring based upon the surface area size of the anterior leaflet as well as the width and height of the anterior leaflet. A Sorin memo 3-D annuloplasty ring (size 20mm, catalog #[id number], serial [id number]) is secured in place. Each of the sutures are secured using a Cor-knot device.  After completion of the valve repair the valve was tested with saline and appears to be perfectly competent with a broad symmetrical line of coaptation of the anterior and posterior leaflets.  The atriotomy was closed using a 2-layer closure of running 3-0 Prolene suture after placing a sump drain across the mitral valve to serve as a left ventricular vent.  The single proximal coronary anastomosis is performed directly to the ascending aorta prior to removal of the aortic cross clamp.  One final dose of warm retrograde "hot shot"  cardioplegia was administered retrograde through the coronary sinus catheter while all air was evacuated through the aortic root.  The aortic cross clamp was removed after a total cross clamp time of 172 minutes.   Maze Procedure (right atrial lesion set):  The retrograde cardioplegia cannula was removed and the small hole in the right atrium extended a short distance.  The AtriCure Synergy bipolar radiofrequency ablation clamp is utilized to create a series of linear lesions in the right atrium, each with one limb of the clamp along the endocardial surface and the other along the epicardial surface. The first lesion is placed from the posterior apex of the atriotomy incision and along the lateral wall of the right atrium to reach the lateral aspect of the superior vena cava. A second lesion is placed in the opposite direction from the posterior apex of the atriotomy incision along the lateral wall to reach the lateral aspect of the inferior vena cava. A third lesion is placed from the midportion of the atriotomy incision extending at a right angle to reach the tip of the right atrial appendage. A fourth lesion is placed from the anterior apex of the atriotomy incision in an anterior and inferior direction to reach the acute margin of the heart. Finally, the cryotherapy probe is utilized to complete the right atrial lesion set by placing the probe along the endocardial surface of the right atrium from the anterior apex of the atriotomy incision to reach the tricuspid annulus at the 2:00 position. The right atriotomy incision is closed with a 2 layer closure of running 4-0 Prolene suture.   Procedure Completion:  All proximal and distal coronary anastomoses were inspected for hemostasis and appropriate graft orientation. Epicardial pacing wires are fixed to the right ventricular outflow tract and to the right atrial appendage. The patient is rewarmed to 37C temperature. The aortic and left ventricular  vents are removed.  The patient is weaned and disconnected from cardiopulmonary bypass.  The patient's rhythm at separation from bypass was sinus.  The patient was weaned from cardioplegic bypass on low dose dopamine and milrinone. Total cardiopulmonary bypass time for the operation was 207 minutes.  Followup transesophageal echocardiogram performed after separation from bypass revealed a well-seated annuloplasty ring in the mitral position. The mitral valve was functioning normally with no residual mitral regurgitation. There remained 2+ aortic insufficiency. Left ventricular function was unchanged from preoperatively.  The aortic and superior vena cava cannula were removed uneventfully. Protamine was administered to reverse the anticoagulation. The femoral venous cannula was removed and manual pressure held on the groin for 30 minutes.  The mediastinum and pleural space were inspected for hemostasis and irrigated with saline solution. There was moderate coagulopathy.  The patient received a total of 2 packs adult platelets and 2 units fresh frozen plasma due to coagulopathy and thrombocytopenia after separation from cardiopulmonary bypass and reversal of heparin with protamine.  The mediastinum and left pleural space were drained using 3 chest tubes placed through separate stab incisions inferiorly.  The soft tissues anterior to the aorta were reapproximated loosely. The sternum is closed with double strength sternal wire. The soft tissues anterior to the sternum were closed in multiple layers and the skin is closed with a running subcuticular skin closure.  The post-bypass portion of the operation was notable for stable rhythm and hemodynamics.     Patient Disposition:  The patient tolerated the procedure well and is transported to the surgical intensive care in stable condition. There are no intraoperative complications. All sponge instrument and needle counts are verified correct at completion of  the operation.     Valentina Gu. Roxy Manns MD 02/28/2013 3:42 PM

## 2013-02-28 NOTE — Progress Notes (Addendum)
Patient ID: Luis Buba., male   DOB: 1927/11/20, 80 y.o.   MRN: [id number]  SICU Evening rounds:  Hemodynamically stable on Milrinone 0.3 and dop 3 CI 2.2 Sinus rhythm  Remains asleep on vent.  Urine output ok Chest tube output low  CBC    Component Value Date/Time   WBC 6.5 02/28/2013 1615   RBC 3.13* 02/28/2013 1615   HGB 10.1* 02/28/2013 1615   HGB 9.5* 02/28/2013 1615   HCT 27.5* 02/28/2013 1615   HCT 28.0* 02/28/2013 1615   PLT 96* 02/28/2013 1615   MCV 87.9 02/28/2013 1615   MCH 32.3 02/28/2013 1615   MCHC 36.7* 02/28/2013 1615   RDW 13.3 02/28/2013 1615   LYMPHSABS 2.4 09/19/2012 0738   MONOABS 0.5 09/19/2012 0738   EOSABS 0.1 09/19/2012 0738   BASOSABS 0.0 09/19/2012 0738    BMET    Component Value Date/Time   NA 141 02/28/2013 1615   K 4.7 02/28/2013 1615   CL 106 02/26/2013 0521   CO2 23 02/26/2013 0521   GLUCOSE 122* 02/28/2013 1615   BUN 18 02/26/2013 0521   CREATININE 0.84 02/26/2013 0521   CALCIUM 8.9 02/26/2013 0521   GFRNONAA 79* 02/26/2013 0521   GFRAA >90 02/26/2013 0521    A/P: stable, wean vent as tolerated when awake.

## 2013-02-28 NOTE — Brief Op Note (Addendum)
      [address]       [place] [address]             [phone]     02/25/2013 - 02/28/2013  1:40 PM  PATIENT:  [name].  80 y.o. male  PRE-OPERATIVE DIAGNOSIS:  CAD A-FIB  POST-OPERATIVE DIAGNOSIS:  CAD A-FIB MITRAL REGURGITATION  PROCEDURE:  Procedure(s): CORONARY ARTERY BYPASS GRAFTING (CABG)X2 LIMA-LAD; SVG-OM COMPLEX MITRAL VALVE REPAIR/RING ANNULOPLASTY W/ MEMO 3-D #30 INTRAOPERATIVE TRANSESOPHAGEAL ECHOCARDIOGRAM MAZE PROCEDURE- COMPLETE EVH RIGHT THIGH  SURGEON:    Rexene Alberts, MD  ASSISTANTS:  John Giovanni, PA-C  ANESTHESIA:   Laurie Panda, MD  CROSSCLAMP TIME:   22'  CARDIOPULMONARY BYPASS TIME: 207'  FINDINGS:  Normal LV systolic function  Fibroelastic deficiency type degenerative disease of the mitral valve with small flail segment of posterior leaflet (P2)  Type II dysfunction with moderate-severe (3+) mitral regurgitation  Mild-moderate (2+) aortic insufficiency  Good quality LIMA conduit for grafting  Good quality SVG conduit for grafting  Distal right coronary and posterior descending coronary chronically occluded and too diseased for grafting  Intramyocardial left anterior descending coronary artery  No residual mitral regurgitation after successful valve repair  COMPLICATIONS: None   PRE-OPERATIVE WEIGHT: 83.5kg  PATIENT DISPOSITION:   TO SICU IN STABLE CONDITION  [name],Luis H 02/28/2013 3:32 PM

## 2013-02-28 NOTE — OR Nursing (Signed)
09:45am - called volunteer desk to update family of patient status per Dr. Roxy Manns.

## 2013-03-01 ENCOUNTER — Inpatient Hospital Stay (HOSPITAL_COMMUNITY): Payer: Medicare HMO

## 2013-03-01 LAB — POCT I-STAT 3, ART BLOOD GAS (G3+)
ACID-BASE DEFICIT: 2 mmol/L (ref 0.0–2.0)
Acid-base deficit: 3 mmol/L — ABNORMAL HIGH (ref 0.0–2.0)
Bicarbonate: 21.2 mEq/L (ref 20.0–24.0)
Bicarbonate: 20.9 mEq/L (ref 20.0–24.0)
O2 Saturation: 97 %
O2 Saturation: 94 %
TCO2: 22 mmol/L (ref 0–100)
TCO2: 22 mmol/L (ref 0–100)
pCO2 arterial: 29.2 mmHg — ABNORMAL LOW (ref 35.0–45.0)
pCO2 arterial: 33.3 mmHg — ABNORMAL LOW (ref 35.0–45.0)
pH, Arterial: 7.47 — ABNORMAL HIGH (ref 7.350–7.450)
pH, Arterial: 7.408 (ref 7.350–7.450)
pO2, Arterial: 88 mmHg (ref 80.0–100.0)
pO2, Arterial: 72 mmHg — ABNORMAL LOW (ref 80.0–100.0)

## 2013-03-01 LAB — PREPARE PLATELET PHERESIS
UNIT DIVISION: 0
Unit division: 0

## 2013-03-01 LAB — MAGNESIUM
MAGNESIUM: 2 mg/dL (ref 1.5–2.5)
Magnesium: 2.3 mg/dL (ref 1.5–2.5)

## 2013-03-01 LAB — PREPARE FRESH FROZEN PLASMA
Unit division: 0
Unit division: 0

## 2013-03-01 LAB — GLUCOSE, CAPILLARY
GLUCOSE-CAPILLARY: 107 mg/dL — AB (ref 70–99)
GLUCOSE-CAPILLARY: 114 mg/dL — AB (ref 70–99)
GLUCOSE-CAPILLARY: 114 mg/dL — AB (ref 70–99)
GLUCOSE-CAPILLARY: 133 mg/dL — AB (ref 70–99)
GLUCOSE-CAPILLARY: 149 mg/dL — AB (ref 70–99)
Glucose-Capillary: 114 mg/dL — ABNORMAL HIGH (ref 70–99)
Glucose-Capillary: 123 mg/dL — ABNORMAL HIGH (ref 70–99)
Glucose-Capillary: 111 mg/dL — ABNORMAL HIGH (ref 70–99)
Glucose-Capillary: 115 mg/dL — ABNORMAL HIGH (ref 70–99)
Glucose-Capillary: 120 mg/dL — ABNORMAL HIGH (ref 70–99)
Glucose-Capillary: 104 mg/dL — ABNORMAL HIGH (ref 70–99)
Glucose-Capillary: 104 mg/dL — ABNORMAL HIGH (ref 70–99)
Glucose-Capillary: 113 mg/dL — ABNORMAL HIGH (ref 70–99)
Glucose-Capillary: 113 mg/dL — ABNORMAL HIGH (ref 70–99)
Glucose-Capillary: 129 mg/dL — ABNORMAL HIGH (ref 70–99)
Glucose-Capillary: 125 mg/dL — ABNORMAL HIGH (ref 70–99)
Glucose-Capillary: 119 mg/dL — ABNORMAL HIGH (ref 70–99)

## 2013-03-01 LAB — CREATININE, SERUM
Creatinine, Ser: 0.91 mg/dL (ref 0.50–1.35)
GFR calc non Af Amer: 76 mL/min — ABNORMAL LOW (ref >90)
GFR, EST AFRICAN AMERICAN: 88 mL/min — AB (ref >90)

## 2013-03-01 LAB — CBC
HCT: 25.7 % — ABNORMAL LOW (ref 39.0–52.0)
HEMATOCRIT: 26.6 % — AB (ref 39.0–52.0)
HEMOGLOBIN: 9.5 g/dL — AB (ref 13.0–17.0)
Hemoglobin: 9.5 g/dL — ABNORMAL LOW (ref 13.0–17.0)
MCH: 32.1 pg (ref 26.0–34.0)
MCH: 31.8 pg (ref 26.0–34.0)
MCHC: 37 g/dL — ABNORMAL HIGH (ref 30.0–36.0)
MCHC: 35.7 g/dL (ref 30.0–36.0)
MCV: 86.8 fL (ref 78.0–100.0)
MCV: 89 fL (ref 78.0–100.0)
PLATELETS: 93 10*3/uL — AB (ref 150–400)
PLATELETS: 125 10*3/uL — AB (ref 150–400)
RBC: 2.96 MIL/uL — ABNORMAL LOW (ref 4.22–5.81)
RBC: 2.99 MIL/uL — AB (ref 4.22–5.81)
RDW: 13.1 % (ref 11.5–15.5)
RDW: 13.7 % (ref 11.5–15.5)
WBC: 8.7 10*3/uL (ref 4.0–10.5)
WBC: 12 10*3/uL — AB (ref 4.0–10.5)

## 2013-03-01 LAB — BASIC METABOLIC PANEL
BUN: 13 mg/dL (ref 6–23)
CALCIUM: 8.4 mg/dL (ref 8.4–10.5)
CO2: 21 meq/L (ref 19–32)
CREATININE: 0.73 mg/dL (ref 0.50–1.35)
Chloride: 108 mEq/L (ref 96–112)
GFR calc Af Amer: >90 mL/min (ref >90)
GFR, EST NON AFRICAN AMERICAN: 83 mL/min — AB (ref >90)
GLUCOSE: 124 mg/dL — AB (ref 70–99)
Potassium: 3.8 mEq/L (ref 3.7–5.3)
Sodium: 143 mEq/L (ref 137–147)

## 2013-03-01 LAB — POCT I-STAT, CHEM 8
BUN: 20 mg/dL (ref 6–23)
CREATININE: 0.9 mg/dL (ref 0.50–1.35)
Calcium, Ion: 1.16 mmol/L (ref 1.13–1.30)
Chloride: 106 mEq/L (ref 96–112)
GLUCOSE: 127 mg/dL — AB (ref 70–99)
HCT: 26 % — ABNORMAL LOW (ref 39.0–52.0)
HEMOGLOBIN: 8.8 g/dL — AB (ref 13.0–17.0)
POTASSIUM: 5.3 meq/L (ref 3.7–5.3)
Sodium: 139 mEq/L (ref 137–147)
TCO2: 22 mmol/L (ref 0–100)

## 2013-03-01 MED ORDER — TRAMADOL HCL 50 MG PO TABS
50.0000 mg | ORAL_TABLET | Freq: Four times a day (QID) | ORAL | Status: DC | PRN
  Administered 2013-03-01 – 2013-03-06 (×5): 50 mg via ORAL
  Filled 2013-03-01 (×5): qty 1

## 2013-03-01 MED ORDER — FUROSEMIDE 10 MG/ML IJ SOLN
10.0000 mg/h | INTRAVENOUS | Status: AC
  Administered 2013-03-01: 6 mg/h via INTRAVENOUS
  Filled 2013-03-01 (×2): qty 25

## 2013-03-01 MED ORDER — POTASSIUM CHLORIDE 10 MEQ/50ML IV SOLN
10.0000 meq | INTRAVENOUS | Status: AC
  Administered 2013-03-01 (×3): 10 meq via INTRAVENOUS

## 2013-03-01 MED ORDER — POTASSIUM CHLORIDE 10 MEQ/50ML IV SOLN
10.0000 meq | INTRAVENOUS | Status: AC
  Administered 2013-03-01 (×2): 10 meq via INTRAVENOUS
  Filled 2013-03-01 (×2): qty 50

## 2013-03-01 MED ORDER — INSULIN ASPART 100 UNIT/ML ~~LOC~~ SOLN
0.0000 [IU] | SUBCUTANEOUS | Status: DC
  Administered 2013-03-01 – 2013-03-02 (×3): 2 [IU] via SUBCUTANEOUS

## 2013-03-01 MED ORDER — OXYCODONE HCL 5 MG PO TABS
5.0000 mg | ORAL_TABLET | Freq: Four times a day (QID) | ORAL | Status: DC | PRN
  Filled 2013-03-01: qty 1

## 2013-03-01 MED FILL — Lidocaine HCl IV Inj 20 MG/ML: INTRAVENOUS | Qty: 5 | Status: AC

## 2013-03-01 MED FILL — Mannitol IV Soln 20%: INTRAVENOUS | Qty: 500 | Status: AC

## 2013-03-01 MED FILL — Heparin Sodium (Porcine) Inj 1000 Unit/ML: INTRAMUSCULAR | Qty: 10 | Status: AC

## 2013-03-01 MED FILL — Sodium Bicarbonate IV Soln 8.4%: INTRAVENOUS | Qty: 50 | Status: AC

## 2013-03-01 MED FILL — Sodium Chloride IV Soln 0.9%: INTRAVENOUS | Qty: 2000 | Status: AC

## 2013-03-01 MED FILL — Electrolyte-R (PH 7.4) Solution: INTRAVENOUS | Qty: 4000 | Status: AC

## 2013-03-01 NOTE — Progress Notes (Signed)
Patient ID: Luis Buba., male   DOB: 1927/07/17, 80 y.o.   MRN: [id number] EVENING ROUNDS NOTE :     [address]       [place] [address]             [phone]                 1 Day Post-Op Procedure(s) (LRB): CORONARY ARTERY BYPASS GRAFTING (CABG) (N/A) INTRAOPERATIVE TRANSESOPHAGEAL ECHOCARDIOGRAM (N/A) MAZE (N/A)  Total Length of Stay:  LOS: 4 days  BP 127/96  Pulse 95  Temp(Src) 99.7 F (37.6 C) (Oral)  Resp 17  Ht 5\' 9"  (1.753 m)  Wt 202 lb 13.2 oz (92 kg)  BMI 29.94 kg/m2  SpO2 97%  .Intake/Output     01/08 0701 - 01/09 0700   P.O. 480   I.V. (mL/kg) 537.1 (5.8)   Blood    NG/GT    IV Piggyback 250   Total Intake(mL/kg) 1267.1 (13.8)   Urine (mL/kg/hr) 655 (0.6)   Chest Tube 170 (0.2)   Total Output 825   Net +442.1         . sodium chloride 20 mL/hr at 03/01/13 1800  . sodium chloride    . DOPamine Stopped (03/01/13 1800)  . furosemide (LASIX) infusion 6 mg/hr (03/01/13 1800)  . lactated ringers 20 mL/hr at 03/01/13 1800  . phenylephrine (NEO-SYNEPHRINE) Adult infusion Stopped (02/28/13 1630)     Lab Results  Component Value Date   WBC 12.0* 03/01/2013   HGB 8.8* 03/01/2013   HCT 26.0* 03/01/2013   PLT 125* 03/01/2013   GLUCOSE 127* 03/01/2013   CHOL 155 02/26/2013   TRIG 78 02/26/2013   HDL 37* 02/26/2013   LDLDIRECT 159.3 05/17/2007   LDLCALC 102* 02/26/2013   ALT 21 02/26/2013   AST 23 02/26/2013   NA 139 03/01/2013   K 5.3 03/01/2013   CL 106 03/01/2013   CREATININE 0.90 03/01/2013   BUN 20 03/01/2013   CO2 21 03/01/2013   TSH 2.789 02/26/2013   PSA 20.80* 02/12/2013   INR 1.57* 02/28/2013   HGBA1C 5.7* 02/26/2013   Stable day On lasix drip Cr 0.9  Grace Isaac MD  Beeper [phone] Office [phone] 03/01/2013 7:06 PM

## 2013-03-01 NOTE — Progress Notes (Addendum)
TCTS DAILY ICU PROGRESS NOTE                   [address]            [place] [address]          [phone]   1 Day Post-Op Procedure(s) (LRB): CORONARY ARTERY BYPASS GRAFTING (CABG) (N/A) INTRAOPERATIVE TRANSESOPHAGEAL ECHOCARDIOGRAM (N/A) MAZE (N/A)  Total Length of Stay:  LOS: 4 days   Subjective: Failed multiple attempts to wean vent last night. H/o anxiety.  Slightly agitated this am, but awake, following commands.   Objective: Vital signs in last 24 hours: Temp:  [97 F (36.1 C)-99.9 F (37.7 C)] 99.3 F (37.4 C) (01/08 0800) Pulse Rate:  [56-110] 85 (01/08 0800) Cardiac Rhythm:  [-] Normal sinus rhythm;Heart block (01/08 0800) Resp:  [5-25] 19 (01/08 0800) BP: (91-125)/(55-73) 94/55 mmHg (01/08 0800) SpO2:  [97 %-100 %] 99 % (01/08 0800) FiO2 (%):  [50 %] 50 % (01/08 0800) Weight:  [184 lb 1.4 oz (83.5 kg)-202 lb 13.2 oz (92 kg)] 202 lb 13.2 oz (92 kg) (01/08 0630)  Filed Weights   02/27/13 2313 02/28/13 2015 03/01/13 0630  Weight: 184 lb 1.4 oz (83.5 kg) 184 lb 1.4 oz (83.5 kg) 202 lb 13.2 oz (92 kg)  PRE-OPERATIVE WEIGHT: 83.5kg   Weight change: 0 lb (0 kg)   Hemodynamic parameters for last 24 hours: PAP: (22-52)/(9-25) 36/19 mmHg CO:  [6.4 L/min-7 L/min] 6.6 L/min CI:  [3.1 L/min/m2-3.5 L/min/m2] 3.3 L/min/m2  Intake/Output from previous day: 01/07 0701 - 01/08 0700 In: 9257.5 [I.V.:5432.5; Blood:1625; NG/GT:100; IV FEOFHQRFX:5883] Out: 2549 [Urine:3440; Chest Tube:690]  Intake/Output this shift: Total I/O In: 158.2 [I.V.:58.2; IV Piggyback:100] Out: 60 [Urine:30; Chest Tube:30]  Current Meds: Scheduled Meds: . acetaminophen  1,000 mg Oral Q6H   Or  . acetaminophen (TYLENOL) oral liquid 160 mg/5 mL  1,000 mg Per Tube Q6H  . aspirin EC  325 mg Oral Daily   Or  . aspirin  324 mg Per Tube Daily  . bisacodyl  10 mg Oral Daily   Or  . bisacodyl  10 mg Rectal Daily  . cefUROXime (ZINACEF)  IV  1.5 g Intravenous Q12H  .  Chlorhexidine Gluconate Cloth  6 each Topical Daily  . docusate sodium  200 mg Oral Daily  . famotidine (PEPCID) IV  20 mg Intravenous Q12H  . insulin regular  0-10 Units Intravenous TID WC  . metoprolol tartrate  12.5 mg Oral BID   Or  . metoprolol tartrate  12.5 mg Per Tube BID  . mupirocin ointment  1 application Nasal BID  . [START ON 03/02/2013] pantoprazole  40 mg Oral Daily  . potassium chloride  10 mEq Intravenous Q1 Hr x 3  . sodium chloride  3 mL Intravenous Q12H   Continuous Infusions: . sodium chloride 20 mL/hr at 02/28/13 1900  . sodium chloride    . dexmedetomidine 0.2 mcg/kg/hr (03/01/13 0800)  . DOPamine 3 mcg/kg/min (02/28/13 1900)  . insulin (NOVOLIN-R) infusion    . lactated ringers 20 mL/hr at 02/28/13 1900  . milrinone 0.3 mcg/kg/min (03/01/13 0019)  . nitroGLYCERIN Stopped (02/28/13 1630)  . phenylephrine (NEO-SYNEPHRINE) Adult infusion Stopped (02/28/13 1630)   PRN Meds:.metoprolol, midazolam, morphine injection, ondansetron (ZOFRAN) IV, oxyCODONE, sodium chloride  CBGs [id number]   Physical Exam: General appearance: alert and awake on vent Heart: RRR with some PACs/PVCs Lungs: clear to auscultation bilaterally Extremities: no edema, redness or tenderness in the calves  or thighs Wound: Dressed and dry  Lab Results: CBC: Recent Labs  02/28/13 2200 02/28/13 2203 03/01/13 0330  WBC 8.1  --  8.7  HGB 9.6* 8.8* 9.5*  HCT 26.8* 26.0* 25.7*  PLT 106*  --  93*   BMET:  Recent Labs  02/28/13 2203 03/01/13 0330  NA 142 143  K 3.5* 3.8  CL 107 108  CO2  --  21  GLUCOSE 131* 124*  BUN 11 13  CREATININE 0.80 0.73  CALCIUM  --  8.4    PT/INR:  Recent Labs  02/28/13 1615  LABPROT 18.3*  INR 1.57*   Radiology: Dg Chest 2 View  02/27/2013   CLINICAL DATA:  Shortness of breath  EXAM: CHEST  2 VIEW  COMPARISON:  02/25/2013  FINDINGS: Cardiac shadow remains enlarged. A large hiatal hernia is again  identified. The lungs are well aerated bilaterally. No focal infiltrate is seen. No acute bony abnormality is noted. An old left clavicular fracture is again noted.  IMPRESSION: No acute abnormality seen.   Electronically Signed   By: Inez Catalina M.D.   On: 02/27/2013 16:18   Dg Chest Portable 1 View In Am  03/01/2013   CLINICAL DATA:  Status post CABG. Coronary artery disease and atrial fibrillation. Hypertension.  EXAM: PORTABLE CHEST - 1 VIEW  COMPARISON:  DG CHEST 1V PORT dated 02/28/2013; DG CHEST 2 VIEW dated 02/27/2013  FINDINGS: Endotracheal tube terminates at the level of the clavicles, 6.2 cm above carina. Nasogastric tube extends beyond the inferior aspect of the film.  A right IJ Swan-Ganz catheter is unchanged in position. The tip is likely within the proximal right pulmonary artery or pulmonary outflow tract.  Mediastinal drain and at least 1 left-sided chest tube. Left atrial occlusion device.  Cardiomegaly accentuated by AP portable technique. Probable small left pleural effusion. No pneumothorax. Low lung volumes with resultant pulmonary interstitial prominence. No change in left base airspace disease/ atelectasis.  IMPRESSION: Endotracheal tube borderline high in position, 6.2 cm above carina. Consider advancement or attention on follow-up.  Otherwise, expected appearance after median sternotomy with left base atelectasis and possible small left pleural effusion.  No pneumothorax.   Electronically Signed   By: Abigail Miyamoto M.D.   On: 03/01/2013 07:53   Dg Chest Portable 1 View  02/28/2013   CLINICAL DATA:  Postoperative from CABG  EXAM: PORTABLE CHEST - 1 VIEW  COMPARISON:  PA and lateral chest dated February 27, 2013  FINDINGS: The lungs are slightly less well inflated today. The interstitial markings are not dramatically changed. The cardiopericardial silhouette is mildly enlarged. The pulmonary vascularity is minimally prominent centrally. There is no evidence of a pneumothorax nor significant  pleural effusion.  The endotracheal tube tip lies approximately 6.5 cm above the crotch of the carina. The Swan-Ganz type catheter tip lies fairly centrally likely in the proximal right main pulmonary artery. The esophagogastric tube tip projects at the level of the GE junction and advancement is needed. The left lower chest tube tip lies in the region of the medial costophrenic angle. A mediastinal drain tip projects at approximately the T7 level. A left atrial appendage clip is demonstrated.  IMPRESSION: 1. The appearance of the mediastinal structures and lung parenchyma is consistent with the post CABG state. Very mild interstitial edema and subsegmental atelectasis is noted on the left. 2. The support tubes and lines appear to be appropriately positioned with the exception of the esophagogastric tube which needs to be advanced by  at least 20 cm.   Electronically Signed   By: David  Martinique   On: 02/28/2013 16:49     Assessment/Plan: S/P Procedure(s) (LRB): CORONARY ARTERY BYPASS GRAFTING (CABG) (N/A) INTRAOPERATIVE TRANSESOPHAGEAL ECHOCARDIOGRAM (N/A) MAZE (N/A) Pulm- stable, weaning vent at present.  Hopefully will be able to extubate this am. CV- generally SR overnight, few brief episodes AF.  On Dopamine, Milrinone.  Wean drips as able. CBGs stable on insulin drip.  Wean as tolerated. No h/o DM. Routine POD#1 progression.  COLLINS,GINA H 03/01/2013 8:30 AM  Recently extubated Neuro intact NSR w/ few PAC's Will wean drips, diurese and mibilize OOB to chair

## 2013-03-01 NOTE — Progress Notes (Signed)
Second attempt to wean precedex and rapid wean vent. unsuccessful d/t Pt becoming agitated and restless with ventilator asynchrony when precedex rate decreased to 0.2mg , pain management attempted again with MSO4 2mg  IV given without success. Precedex drip increased back to 0.7mg  and Versed 2mg  IV given to maintain RASS -2 till 0530 when another attempt will take place, this was explained to Pt with a nod of understanding. Will continue to monitor/assess.

## 2013-03-01 NOTE — Procedures (Signed)
Extubation Procedure Note  Patient Details:   Name: [name]. DOB: [birth year] MRN: [id number]      Pt extubated to [facility] per SICU wean protocol.  Pt tolerated well. Parameters done on vent; NIF -20, FVC 0.81 with moderate effort.     Evaluation  O2 sats: stable throughout Complications: No apparent complications Patient did tolerate procedure well. Bilateral Breath Sounds: Clear;Diminished Suctioning: Airway Yes  [name] 03/01/2013, 9:24 AM

## 2013-03-01 NOTE — Anesthesia Postprocedure Evaluation (Signed)
5 Anesthesia Post-op Note  Patient: [name].  Procedure(s) Performed: Procedure(s) with comments: CORONARY ARTERY BYPASS GRAFTING (CABG) (N/A) - CABG x 2,  using left internal mammary artery and right leg saphenous vein harvested endoscopically INTRAOPERATIVE TRANSESOPHAGEAL ECHOCARDIOGRAM (N/A) MAZE (N/A)  Patient Location: SICU  Anesthesia Type:General  Level of Consciousness: awake and confused  Airway and Oxygen Therapy: Patient Spontanous Breathing, aerosol face mask and Pt was just extubated this morning  Post-op Pain: moderate  Post-op Assessment: Post-op Vital signs reviewed, Respiratory Function Stable, Patent Airway and No signs of Nausea or vomiting  Post-op Vital Signs: Reviewed and stable  Complications: No apparent anesthesia complications

## 2013-03-01 NOTE — Progress Notes (Signed)
K+= 3.5 and creat= 0.8 w/ urine o/p > 30cc/hr; TCTS KCL protocol initiated with 10 mEq KCL in 50cc IV x 3, each over one hour.

## 2013-03-02 ENCOUNTER — Encounter (HOSPITAL_COMMUNITY): Payer: Self-pay | Admitting: Anesthesiology

## 2013-03-02 ENCOUNTER — Encounter (HOSPITAL_COMMUNITY): Payer: Self-pay | Admitting: Thoracic Surgery (Cardiothoracic Vascular Surgery)

## 2013-03-02 ENCOUNTER — Inpatient Hospital Stay (HOSPITAL_COMMUNITY): Payer: Medicare HMO

## 2013-03-02 LAB — BASIC METABOLIC PANEL
BUN: 19 mg/dL (ref 6–23)
CO2: 22 mEq/L (ref 19–32)
Calcium: 8.4 mg/dL (ref 8.4–10.5)
Chloride: 105 mEq/L (ref 96–112)
Creatinine, Ser: 1.1 mg/dL (ref 0.50–1.35)
GFR calc Af Amer: 70 mL/min — ABNORMAL LOW (ref >90)
GFR calc non Af Amer: 60 mL/min — ABNORMAL LOW (ref >90)
Glucose, Bld: 138 mg/dL — ABNORMAL HIGH (ref 70–99)
Potassium: 3.8 mEq/L (ref 3.7–5.3)
Sodium: 140 mEq/L (ref 137–147)

## 2013-03-02 LAB — CBC
HCT: 25.6 % — ABNORMAL LOW (ref 39.0–52.0)
Hemoglobin: 9 g/dL — ABNORMAL LOW (ref 13.0–17.0)
MCH: 31.6 pg (ref 26.0–34.0)
MCHC: 35.2 g/dL (ref 30.0–36.0)
MCV: 89.8 fL (ref 78.0–100.0)
Platelets: 96 10*3/uL — ABNORMAL LOW (ref 150–400)
RBC: 2.85 MIL/uL — ABNORMAL LOW (ref 4.22–5.81)
RDW: 13.9 % (ref 11.5–15.5)
WBC: 10.1 10*3/uL (ref 4.0–10.5)

## 2013-03-02 LAB — GLUCOSE, CAPILLARY
GLUCOSE-CAPILLARY: 125 mg/dL — AB (ref 70–99)
GLUCOSE-CAPILLARY: 135 mg/dL — AB (ref 70–99)
Glucose-Capillary: 118 mg/dL — ABNORMAL HIGH (ref 70–99)

## 2013-03-02 MED ORDER — WARFARIN - PHARMACIST DOSING INPATIENT
Freq: Every day | Status: DC
  Administered 2013-03-02 – 2013-03-05 (×2)

## 2013-03-02 MED ORDER — WARFARIN SODIUM 2.5 MG PO TABS
2.5000 mg | ORAL_TABLET | Freq: Every day | ORAL | Status: DC
  Administered 2013-03-02 – 2013-03-05 (×4): 2.5 mg via ORAL
  Filled 2013-03-02 (×5): qty 1

## 2013-03-02 MED ORDER — FUROSEMIDE 40 MG PO TABS
40.0000 mg | ORAL_TABLET | Freq: Two times a day (BID) | ORAL | Status: AC
  Administered 2013-03-03 – 2013-03-05 (×6): 40 mg via ORAL
  Filled 2013-03-02 (×7): qty 1

## 2013-03-02 MED ORDER — POTASSIUM CHLORIDE CRYS ER 20 MEQ PO TBCR
20.0000 meq | EXTENDED_RELEASE_TABLET | Freq: Two times a day (BID) | ORAL | Status: DC
  Filled 2013-03-02 (×2): qty 1

## 2013-03-02 MED ORDER — SODIUM CHLORIDE 0.9 % IJ SOLN
3.0000 mL | Freq: Two times a day (BID) | INTRAMUSCULAR | Status: DC
  Administered 2013-03-02 – 2013-03-05 (×6): 3 mL via INTRAVENOUS

## 2013-03-02 MED ORDER — LEVOTHYROXINE SODIUM 50 MCG PO TABS
50.0000 ug | ORAL_TABLET | Freq: Every day | ORAL | Status: DC
  Administered 2013-03-03 – 2013-03-06 (×4): 50 ug via ORAL
  Filled 2013-03-02 (×5): qty 1

## 2013-03-02 MED ORDER — ASPIRIN EC 81 MG PO TBEC
81.0000 mg | DELAYED_RELEASE_TABLET | Freq: Every day | ORAL | Status: DC
  Administered 2013-03-03 – 2013-03-06 (×4): 81 mg via ORAL
  Filled 2013-03-02 (×5): qty 1

## 2013-03-02 MED ORDER — SODIUM CHLORIDE 0.9 % IV SOLN: 250.0000 mL | INTRAVENOUS | Status: DC | PRN

## 2013-03-02 MED ORDER — MOVING RIGHT ALONG BOOK
Freq: Once | Status: AC
  Administered 2013-03-02: 10:00:00
  Filled 2013-03-02: qty 1

## 2013-03-02 MED ORDER — MAGNESIUM HYDROXIDE 400 MG/5ML PO SUSP: 30.0000 mL | Freq: Every day | ORAL | Status: DC | PRN

## 2013-03-02 MED ORDER — METOPROLOL TARTRATE 12.5 MG HALF TABLET: 12.5000 mg | ORAL_TABLET | Freq: Two times a day (BID) | ORAL | Status: DC

## 2013-03-02 MED ORDER — SODIUM CHLORIDE 0.9 % IJ SOLN: 3.0000 mL | INTRAMUSCULAR | Status: DC | PRN

## 2013-03-02 MED ORDER — WARFARIN VIDEO
Freq: Once | Status: AC
  Administered 2013-03-02: 18:00:00

## 2013-03-02 MED ORDER — METOPROLOL TARTRATE 25 MG PO TABS
25.0000 mg | ORAL_TABLET | Freq: Two times a day (BID) | ORAL | Status: DC
  Filled 2013-03-02 (×2): qty 1

## 2013-03-02 MED ORDER — ENOXAPARIN SODIUM 40 MG/0.4ML ~~LOC~~ SOLN
40.0000 mg | SUBCUTANEOUS | Status: DC
  Administered 2013-03-03 – 2013-03-05 (×3): 40 mg via SUBCUTANEOUS
  Filled 2013-03-02 (×4): qty 0.4

## 2013-03-02 MED ORDER — AMIODARONE HCL 200 MG PO TABS
200.0000 mg | ORAL_TABLET | Freq: Two times a day (BID) | ORAL | Status: DC
  Administered 2013-03-02 – 2013-03-06 (×8): 200 mg via ORAL
  Filled 2013-03-02 (×10): qty 1

## 2013-03-02 MED ORDER — COUMADIN BOOK
Freq: Once | Status: AC
  Administered 2013-03-02: 11:00:00
  Filled 2013-03-02: qty 1

## 2013-03-02 MED ORDER — METOPROLOL TARTRATE 12.5 MG HALF TABLET
12.5000 mg | ORAL_TABLET | Freq: Two times a day (BID) | ORAL | Status: DC
  Administered 2013-03-02 – 2013-03-06 (×7): 12.5 mg via ORAL
  Filled 2013-03-02 (×9): qty 1

## 2013-03-02 NOTE — Progress Notes (Signed)
After sleeve removal while pressure applied to central line IJ site for hemostasis pt HR dropped in to 50's with concomitant drop in BP - see attached VS. (Pt also received AM 12.5 mg of lopressor) Pacemaker attached and started AAI @ 80 and BP returned 114/66 (78). Pt remained A and O x4, GCS 15, denied any dizziness, pain, discomfort, or difficulty breathing. Will continue to monitor pt.

## 2013-03-02 NOTE — Evaluation (Signed)
Physical Therapy Evaluation Patient Details Name: [name]. MRN: [id number] DOB: 1927/11/12 Today's Date: 03/02/2013 Time: 0865-7846 PT Time Calculation (min): 22 min  PT Assessment / Plan / Recommendation History of Present Illness  Patient has hx of A.fib but no known hx of CAD. Last stress test was 1983. Patient was doing some unpacking and heavy lifting. He went up and down the stairs and started to noted that he was having chest pain with walking up the stairs and lifting boxes. The pain would last up to 20 min and would go away with rest. Felt like tightness it radiated slightly to left shoulder. Denies shortness of breath or diaphoresis associated with this. This is the most activity he has done in a while. On admission, pt underwent cath and angiogram.  Now s/p CABG.  Clinical Impression  Pt admitted with SOB,cp.  Pt currently with functional limitations due to the deficits listed below (see PT Problem List).  Pt will benefit from skilled PT to increase their independence and safety with mobility to allow discharge home with wife's available assist.      PT Assessment  Patient needs continued PT services    Follow Up Recommendations  Home health PT;Supervision for mobility/OOB    Does the patient have the potential to tolerate intense rehabilitation      Barriers to Discharge        Equipment Recommendations   (TBA)    Recommendations for Other Services     Frequency Min 3X/week    Precautions / Restrictions Precautions Precautions: Fall;Sternal   Pertinent Vitals/Pain sats with ambulation maintained in upper 90's      Mobility  Bed Mobility General bed mobility comments: not tested Transfers Overall transfer level: Needs assistance Transfers: Sit to/from Stand Sit to Stand: Min assist General transfer comment: help to come forward given sternal precautions Ambulation/Gait Ambulation/Gait assistance: Min guard;Min assist Ambulation Distance (Feet): 330  Feet Assistive device: Rolling walker (2 wheeled) Gait Pattern/deviations: Step-through pattern;Scissoring;Wide base of support Gait velocity: slower Gait velocity interpretation: Below normal speed for age/gender    Exercises     PT Diagnosis: Generalized weakness;Other (comment) (decr activity tolerance)  PT Problem List: Decreased strength;Decreased activity tolerance;Decreased balance;Decreased knowledge of use of DME;Decreased knowledge of precautions;Cardiopulmonary status limiting activity PT Treatment Interventions: DME instruction;Gait training;Stair training;Functional mobility training;Therapeutic activities;Patient/family education;Balance training     PT Goals(Current goals can be found in the care plan section) Acute Rehab PT Goals Patient Stated Goal: Get home and be able to do everything I need to do . PT Goal Formulation: With patient Time For Goal Achievement: 03/16/13 Potential to Achieve Goals: Good  Visit Information  Last PT Received On: 03/02/13 Assistance Needed: +1 History of Present Illness: Patient has hx of A.fib but no known hx of CAD. Last stress test was 1983. Patient was doing some unpacking and heavy lifting. He went up and down the stairs and started to noted that he was having chest pain with walking up the stairs and lifting boxes. The pain would last up to 20 min and would go away with rest. Felt like tightness it radiated slightly to left shoulder. Denies shortness of breath or diaphoresis associated with this. This is the most activity he has done in a while. On admission, pt underwent cath and angiogram.  Now s/p CABG.       Prior [place] expects to be discharged to:: Private residence Living Arrangements: Spouse/significant other Available Help at Discharge: Family  Type of Home: House Home Access: Stairs to enter [employer] of Steps: 3 Entrance Stairs-Rails: Right;Left Home Layout: Two  level;Bed/bath upstairs Alternate Level Stairs-Number of Steps: 13 Alternate Level Stairs-Rails: Right;Left Home Equipment: Cane - single point (TBA) Prior Function Level of Independence: Independent Communication Communication: No difficulties;HOH    Cognition  Cognition Arousal/Alertness: Awake/alert Behavior During Therapy: WFL for tasks assessed/performed Overall Cognitive Status: Within Functional Limits for tasks assessed    Extremity/Trunk Assessment Upper Extremity Assessment Upper Extremity Assessment: Defer to OT evaluation Lower Extremity Assessment Lower Extremity Assessment: Overall WFL for tasks assessed;Generalized weakness Cervical / Trunk Assessment Cervical / Trunk Assessment: Normal   Balance Balance Overall balance assessment: Needs assistance (as he fatigued started drifting L with occaisional scissorin)  End of Session PT - End of Session Activity Tolerance: Patient tolerated treatment well Patient left: in chair;with call bell/phone within reach Nurse Communication: Mobility status  GP     Essica Kiker, Tessie Fass 03/02/2013, 4:28 PM

## 2013-03-02 NOTE — OR Nursing (Signed)
LATE ENTRY: Added mitral valve repair to procedures performed.

## 2013-03-02 NOTE — Progress Notes (Signed)
[address]       [place] [address]             [phone]        CARDIOTHORACIC SURGERY PROGRESS NOTE   R2 Days Post-Op Procedure(s) (LRB): CORONARY ARTERY BYPASS GRAFTING (CABG) (N/A) INTRAOPERATIVE TRANSESOPHAGEAL ECHOCARDIOGRAM (N/A) MAZE (N/A) MITRAL VALVE REPAIR  Subjective: Looks very good.  Mild soreness in chest.  Ambulating w/ assistance.  Breathing comfortably.  Wants breakfast.  Objective: Vital signs: BP Readings from Last 1 Encounters:  03/02/13 112/71   Pulse Readings from Last 1 Encounters:  03/02/13 81   Resp Readings from Last 1 Encounters:  03/02/13 19   Temp Readings from Last 1 Encounters:  03/02/13 98.1 F (36.7 C) Oral    Hemodynamics: PAP: (47-53)/(22-34) 48/22 mmHg  Physical Exam:  Rhythm:   Sinus w/ PAC's  Breath sounds: Clear w/ few bibasilar crackles  Heart sounds:  RRR w/out murmur  Incisions:  Dressings dry, intact  Abdomen:  Soft, non-distended, non-tender  Extremities:  Warm, well-perfused    Intake/Output from previous day: 01/08 0701 - 01/09 0700 In: 1725.1 [P.O.:480; I.V.:895.1; IV Piggyback:350] Out: 2035 [Urine:1515; Chest Tube:520] Intake/Output this shift: Total I/O In: 266 [P.O.:240; I.V.:26] Out: 200 [Urine:200]  Lab Results:  CBC: Recent Labs  03/01/13 1629 03/01/13 1635 03/02/13 0410  WBC 12.0*  --  10.1  HGB 9.5* 8.8* 9.0*  HCT 26.6* 26.0* 25.6*  PLT 125*  --  96*    BMET:  Recent Labs  03/01/13 0330  03/01/13 1635 03/02/13 0410  NA 143  --  139 140  K 3.8  --  5.3 3.8  CL 108  --  106 105  CO2 21  --   --  22  GLUCOSE 124*  --  127* 138*  BUN 13  --  20 19  CREATININE 0.73  < > 0.90 1.10  CALCIUM 8.4  --   --  8.4  < > = values in this interval not displayed.   CBG (last 3)   Recent Labs  03/01/13 1631 03/01/13 2329 03/02/13 0740  GLUCAP 125* 119* 118*    ABG    Component Value Date/Time   PHART 7.408 03/01/2013 0901   PCO2ART 33.3* 03/01/2013 0901   PO2ART 72.0* 03/01/2013 0901   HCO3 20.9 03/01/2013 0901   TCO2 22 03/01/2013 1635   ACIDBASEDEF 3.0* 03/01/2013 0901   O2SAT 94.0 03/01/2013 0901    CXR: CLINICAL DATA: Postop for median sternotomy. Hypertension.  EXAM:  PORTABLE CHEST - 1 VIEW  COMPARISON: DG CHEST 1V PORT dated 03/01/2013; DG CHEST 1V PORT dated  02/28/2013  FINDINGS:  Removal of endotracheal and nasogastric tubes. Swan-Ganz catheter  removal with right IJ Cordis sheath remaining in place. The sheath  is likely kinked at the skin surface. Median sternotomy with left  atrial appendage occlusion device. Removal of mediastinal drain . A  left-sided chest tube remains in place.  Cardiomegaly accentuated by AP portable technique. Probable small  bilateral pleural effusions. No pneumothorax. Diminished lung  volumes. This accentuates the pulmonary interstitium. Question mild  superimposed pulmonary venous congestion. Increased right and  similar left base airspace disease.  IMPRESSION:  Extubation. Decreased lung volumes with suspicion of developing  pulmonary venous congestion.  Increased right and similar left base Airspace disease, likely  atelectasis.  Probable small bilateral pleural effusions, but no pneumothorax.  Electronically Signed  By: Abigail Miyamoto M.D.  On: 03/02/2013 07:32   Assessment/Plan:  S/P Procedure(s) (LRB): CORONARY ARTERY BYPASS GRAFTING (CABG) (N/A) INTRAOPERATIVE TRANSESOPHAGEAL ECHOCARDIOGRAM (N/A) MAZE (N/A)  Doing very well POD2 Maintaining sinus rhythm w/ PACs, BP stable off all drips Expected post op acute blood loss anemia, stable Expected post op volume excess, mild, diuresing some on lasix drip Expected post op atelectasis, mild-moderate Slightly limited mobility w/ unstable gait preoperatively   D/C chest tubes  Increase metoprolol and add low dose amiodarone  Start coumadin  Lovenox for DVT prophylaxis  Continue lasix drip today  Transfer step down   [name],[name]  H 03/02/2013 9:31 AM

## 2013-03-02 NOTE — Progress Notes (Signed)
POD # 2 CABG, mitral repair, maze  BP 110/78  Pulse 71  Temp(Src) 98.2 F (36.8 C) (Oral)  Resp 27  Ht 5\' 9"  (1.753 m)  Wt 197 lb 1.5 oz (89.4 kg)  BMI 29.09 kg/m2  SpO2 100%   Intake/Output Summary (Last 24 hours) at 03/02/13 1658 Last data filed at 03/02/13 1600  Gross per 24 hour  Intake 1695.05 ml  Output   1980 ml  Net -284.95 ml    Currently in junctional rhythm  Awaiting bed on 2000

## 2013-03-03 ENCOUNTER — Inpatient Hospital Stay (HOSPITAL_COMMUNITY): Payer: Medicare HMO

## 2013-03-03 LAB — CBC
HEMATOCRIT: 27.4 % — AB (ref 39.0–52.0)
Hemoglobin: 9.6 g/dL — ABNORMAL LOW (ref 13.0–17.0)
MCH: 31.6 pg (ref 26.0–34.0)
MCHC: 35 g/dL (ref 30.0–36.0)
MCV: 90.1 fL (ref 78.0–100.0)
Platelets: 120 10*3/uL — ABNORMAL LOW (ref 150–400)
RBC: 3.04 MIL/uL — AB (ref 4.22–5.81)
RDW: 14.1 % (ref 11.5–15.5)
WBC: 11.4 10*3/uL — AB (ref 4.0–10.5)

## 2013-03-03 LAB — BASIC METABOLIC PANEL
BUN: 26 mg/dL — ABNORMAL HIGH (ref 6–23)
CHLORIDE: 100 meq/L (ref 96–112)
CO2: 27 meq/L (ref 19–32)
Calcium: 8.5 mg/dL (ref 8.4–10.5)
Creatinine, Ser: 1.18 mg/dL (ref 0.50–1.35)
GFR calc Af Amer: 64 mL/min — ABNORMAL LOW (ref >90)
GFR calc non Af Amer: 55 mL/min — ABNORMAL LOW (ref >90)
Glucose, Bld: 115 mg/dL — ABNORMAL HIGH (ref 70–99)
POTASSIUM: 3.4 meq/L — AB (ref 3.7–5.3)
Sodium: 139 mEq/L (ref 137–147)

## 2013-03-03 LAB — PROTIME-INR
INR: 1.48 (ref 0.00–1.49)
PROTHROMBIN TIME: 17.5 s — AB (ref 11.6–15.2)

## 2013-03-03 MED ORDER — BISACODYL 10 MG RE SUPP: 10.0000 mg | Freq: Every day | RECTAL | Status: DC | PRN

## 2013-03-03 MED ORDER — BISACODYL 5 MG PO TBEC
10.0000 mg | DELAYED_RELEASE_TABLET | Freq: Every day | ORAL | Status: DC | PRN
  Administered 2013-03-05: 10 mg via ORAL
  Filled 2013-03-03: qty 2

## 2013-03-03 MED ORDER — POTASSIUM CHLORIDE CRYS ER 20 MEQ PO TBCR
40.0000 meq | EXTENDED_RELEASE_TABLET | Freq: Two times a day (BID) | ORAL | Status: AC
  Administered 2013-03-03 – 2013-03-05 (×6): 40 meq via ORAL
  Filled 2013-03-03 (×7): qty 2

## 2013-03-03 NOTE — Progress Notes (Signed)
Pt ambulated in hallway 600 ft with rolling walker on room air. Pt tolerated activity well. Will continue to monitor.

## 2013-03-03 NOTE — Progress Notes (Signed)
Pt ambulated in hallway 500 ft with rolling walker on room air. Pt slightly unsteady at times. Pt tolerated activity well. Will continue to monitor.

## 2013-03-03 NOTE — Progress Notes (Signed)
Pt completed ambulation with RN upon arrival.  Pt stated he will ambulate again this pm.  Reviewed CRP II with pt and pt is interested in CRP II in [place].  Leverne Humbles, RN 3:15 PM 03/03/2013

## 2013-03-03 NOTE — Progress Notes (Addendum)
[address]       [place] [address]             [phone]          3 Days Post-Op Procedure(s) (LRB): CORONARY ARTERY BYPASS GRAFTING (CABG) (N/A) INTRAOPERATIVE TRANSESOPHAGEAL ECHOCARDIOGRAM (N/A) MAZE (N/A) MITRAL VALVE REPAIR (MVR) (N/A)  Subjective: Sore. Had an episode of nausea and vomiting overnight, but feels better this am and ate a good breakfast.   Objective: Vital signs in last 24 hours: Patient Vitals for the past 24 hrs:  BP Temp Temp src Pulse Resp SpO2 Height Weight  03/03/13 0356 107/71 mmHg 98.5 F (36.9 C) Oral 91 18 95 % - 193 lb 12.6 oz (87.9 kg)  03/02/13 2028 120/70 mmHg 98.2 F (36.8 C) Oral 94 18 96 % - -  03/02/13 1805 134/81 mmHg 97.8 F (36.6 C) Oral 86 20 99 % 5\' 9"  (1.753 m) 200 lb 11.2 oz (91.037 kg)  03/02/13 1700 - - - - - 100 % - -  03/02/13 1600 110/78 mmHg - - 71 27 100 % - -  03/02/13 1539 - 98.2 F (36.8 C) Oral - - - - -  03/02/13 1500 98/58 mmHg - - 73 15 95 % - -  03/02/13 1400 117/77 mmHg - - 78 20 100 % - -  03/02/13 1300 120/80 mmHg - - 80 21 99 % - -  03/02/13 1200 116/75 mmHg - - 81 21 100 % - -  03/02/13 1100 114/66 mmHg 98.3 F (36.8 C) Oral 79 15 99 % - -  03/02/13 1050 84/47 mmHg - - 55 20 97 % - -  03/02/13 1000 100/73 mmHg - - 81 17 100 % - -  03/02/13 0900 112/71 mmHg - - 81 19 99 % - -   Current Weight  03/03/13 193 lb 12.6 oz (87.9 kg)   PRE-OPERATIVE WEIGHT: 83.5kg    Intake/Output from previous day: 01/09 0701 - 01/10 0700 In: 658 [P.O.:560; I.V.:98] Out: 2295 [Urine:2245; Chest Tube:50]    PHYSICAL EXAM:  Heart: RRR Lungs: Few coarse BS bilaterally Wound: Clean and dry Extremities: Trace LE edema    Lab Results: CBC: Recent Labs  03/02/13 0410 03/03/13 0242  WBC 10.1 11.4*  HGB 9.0* 9.6*  HCT 25.6* 27.4*  PLT 96* 120*   BMET:  Recent Labs  03/02/13 0410 03/03/13 0242  NA 140 139  K 3.8 3.4*  CL 105 100  CO2 22 27  GLUCOSE 138* 115*  BUN 19 26*   CREATININE 1.10 1.18  CALCIUM 8.4 8.5    PT/INR:  Recent Labs  03/03/13 0242  LABPROT 17.5*  INR 1.48   CXR: FINDINGS:  Right IJ sheath has been removed. The patient is status post CABG.  There are small bilateral pleural effusions and basilar atelectasis.  Interstitial edema seen on the prior study is resolved. Hiatal  hernia is noted.  IMPRESSION:  Small bilateral pleural effusions and basilar atelectasis.  Resolved interstitial edema.  Hiatal hernia.    Assessment/Plan: S/P Procedure(s) (LRB): CORONARY ARTERY BYPASS GRAFTING (CABG) (N/A) INTRAOPERATIVE TRANSESOPHAGEAL ECHOCARDIOGRAM (N/A) MAZE (N/A) MITRAL VALVE REPAIR (MVR) (N/A) CV- SR/accelerated junctional, BPs low normal.  Continue Amio, Lopressor, Coumadin. HR stable so will roll and tape EPWs. Mild volume overload- diurese. Hypokalemia- replace K+. Pulm- continue pulm toilet, IS, will add flutter valve. Pt states he has IBS and requests d/c [place]. Mobilize, continue PT/CRPI.     LOS: 6  days    COLLINS,GINA H 03/03/2013  Patient seen and examined, agree with above

## 2013-03-04 LAB — PROTIME-INR
INR: 1.37 (ref 0.00–1.49)
Prothrombin Time: 16.5 seconds — ABNORMAL HIGH (ref 11.6–15.2)

## 2013-03-04 NOTE — Progress Notes (Signed)
Pt ambulated with rolling walker on RA 526ft. Tolerated well.

## 2013-03-04 NOTE — Progress Notes (Signed)
Pt ambulated in hallway 350 ft with rolling walker on room air. Pt slightly unsteady at times. Pt tolerated activity well. Will continue to monitor

## 2013-03-04 NOTE — Progress Notes (Signed)
   CARE MANAGEMENT NOTE 03/04/2013  Patient:  [name], [name]   Account Number:  [id number]  Date Initiated:  03/02/2013  Documentation initiated by:  [facility]  Subjective/Objective Assessment:   adm: Chest Pain     Action/Plan:   discharge planning   Anticipated DC Date:  03/05/2013   Anticipated DC Plan:  [place]  CM consult      [facility] Choice  HOME HEALTH   Choice offered to / List presented to:  C-1 Patient        [place] arranged  HH-1 RN  [place].   Status of service:  In process, will continue to follow Medicare Important Message given?   (If response is "NO", the following Medicare IM given date fields will be blank) Date Medicare IM given:   Date Additional Medicare IM given:    Discharge Disposition:  [place]  Per UR Regulation:    If discussed at Long Length of Stay Meetings, dates discussed:    Comments:  03/04/13 CM spoke with pt and spouse of pt in room to offer choice.  Pt chooses [facility] to render HHPT/RN services. Address and contact number were verified.  DME may still be ordered.  Referral faxed to [facility] with probable discharge 1/12-13.  No other CM needs were communicated.  CM will continue to follow for possible DME orders.  Mariane Masters, BSN, CM [phone].

## 2013-03-04 NOTE — Progress Notes (Addendum)
       [address]       [place] [address]             [phone]          4 Days Post-Op Procedure(s) (LRB): CORONARY ARTERY BYPASS GRAFTING (CABG) (N/A) INTRAOPERATIVE TRANSESOPHAGEAL ECHOCARDIOGRAM (N/A) MAZE (N/A) MITRAL VALVE REPAIR (MVR) (N/A)  Subjective: Feeling well, no complaints this am.   Objective: Vital signs in last 24 hours: Patient Vitals for the past 24 hrs:  BP Temp Temp src Pulse Resp SpO2 Weight  03/04/13 0457 100/56 mmHg 98.2 F (36.8 C) Oral 75 18 96 % 192 lb (87.091 kg)  03/04/13 0100 124/76 mmHg - - 80 - - -  03/03/13 2131 - - - 74 - - -  03/03/13 2100 94/45 mmHg - - 75 - - -  03/03/13 1952 105/55 mmHg 99.5 F (37.5 C) Oral 80 16 97 % -  03/03/13 1410 103/56 mmHg 99.4 F (37.4 C) Oral 69 18 100 % -  03/03/13 1030 109/66 mmHg - - 87 18 100 % -   Current Weight  03/04/13 192 lb (87.091 kg)  PRE-OPERATIVE WEIGHT: 83.5kg    Intake/Output from previous day: 01/10 0701 - 01/11 0700 In: 1260 [P.O.:1260] Out: 1225 [Urine:1225]    PHYSICAL EXAM:  Heart: RRR Lungs: Clear Wound: Clean and dry Extremities: Mild LE edema    Lab Results: CBC: Recent Labs  03/02/13 0410 03/03/13 0242  WBC 10.1 11.4*  HGB 9.0* 9.6*  HCT 25.6* 27.4*  PLT 96* 120*   BMET:  Recent Labs  03/02/13 0410 03/03/13 0242  NA 140 139  K 3.8 3.4*  CL 105 100  CO2 22 27  GLUCOSE 138* 115*  BUN 19 26*  CREATININE 1.10 1.18  CALCIUM 8.4 8.5    PT/INR:  Recent Labs  03/04/13 0506  LABPROT 16.5*  INR 1.37      Assessment/Plan: S/P Procedure(s) (LRB): CORONARY ARTERY BYPASS GRAFTING (CABG) (N/A) INTRAOPERATIVE TRANSESOPHAGEAL ECHOCARDIOGRAM (N/A) MAZE (N/A) MITRAL VALVE REPAIR (MVR) (N/A)  CV- acc junctional rhythm, no AF.  Continue Amio, Lopressor, Coumadin.  Vol overload- diurese.  Ambulate, pulm toilet.    He is doing very well.  He lives with his wife, who is handicapped, and he states they are working on other  assistance so he can go home. Will place case management consult and order HHRN/PT as recommended.  Hopefully home in next few days.   LOS: 7 days    COLLINS,GINA H 03/04/2013  Patient seen and examined, agree with above

## 2013-03-05 ENCOUNTER — Encounter (HOSPITAL_COMMUNITY): Payer: Self-pay | Admitting: Thoracic Surgery (Cardiothoracic Vascular Surgery)

## 2013-03-05 DIAGNOSIS — Z9889 Other specified postprocedural states: Secondary | ICD-10-CM

## 2013-03-05 DIAGNOSIS — Z951 Presence of aortocoronary bypass graft: Secondary | ICD-10-CM

## 2013-03-05 LAB — PROTIME-INR
INR: 1.31 (ref 0.00–1.49)
Prothrombin Time: 16 seconds — ABNORMAL HIGH (ref 11.6–15.2)

## 2013-03-05 MED ORDER — LISINOPRIL 2.5 MG PO TABS
2.5000 mg | ORAL_TABLET | Freq: Every day | ORAL | Status: DC
  Administered 2013-03-05 – 2013-03-06 (×2): 2.5 mg via ORAL
  Filled 2013-03-05 (×2): qty 1

## 2013-03-05 MED ORDER — POTASSIUM CHLORIDE CRYS ER 20 MEQ PO TBCR
20.0000 meq | EXTENDED_RELEASE_TABLET | Freq: Every day | ORAL | Status: DC
  Administered 2013-03-06: 20 meq via ORAL
  Filled 2013-03-05 (×2): qty 1

## 2013-03-05 MED ORDER — CEFUROXIME SODIUM 1.5 G IJ SOLR: 1.5000 g | INTRAMUSCULAR | Status: DC | PRN

## 2013-03-05 MED ORDER — FUROSEMIDE 40 MG PO TABS
40.0000 mg | ORAL_TABLET | Freq: Every day | ORAL | Status: DC
  Filled 2013-03-05: qty 1

## 2013-03-05 MED ORDER — DEXTROSE 5 % IV SOLN
1.5000 g | INTRAVENOUS | Status: DC | PRN
  Administered 2013-02-28: .75 g via INTRAVENOUS
  Administered 2013-02-28: 1.5 g via INTRAVENOUS

## 2013-03-05 MED ORDER — ATORVASTATIN CALCIUM 10 MG PO TABS
10.0000 mg | ORAL_TABLET | Freq: Every day | ORAL | Status: DC
Start: 2013-03-05 — End: 2013-03-06
  Administered 2013-03-05: 10 mg via ORAL
  Filled 2013-03-05 (×3): qty 1

## 2013-03-05 MED ORDER — POTASSIUM CHLORIDE CRYS ER 20 MEQ PO TBCR
20.0000 meq | EXTENDED_RELEASE_TABLET | Freq: Once | ORAL | Status: AC
  Administered 2013-03-05: 20 meq via ORAL

## 2013-03-05 NOTE — Progress Notes (Signed)
      [address]       [place] [address]             [phone]     CARDIOTHORACIC SURGERY PROGRESS NOTE  5 Days Post-Op  S/P Procedure(s) (LRB): CORONARY ARTERY BYPASS GRAFTING (CABG) (N/A) INTRAOPERATIVE TRANSESOPHAGEAL ECHOCARDIOGRAM (N/A) MAZE (N/A) MITRAL VALVE REPAIR (MVR) (N/A)  Subjective: Looks and feels well.  Slept well last night.  Mild soreness in chest.  Good appetite.  Bowels working.  No SOB.  Reportedly walking fairly well and hopes to go home soon.  Objective: Vital signs in last 24 hours: Temp:  [98.5 F (36.9 C)-98.8 F (37.1 C)] 98.7 F (37.1 C) (01/12 0300) Pulse Rate:  [78-82] 82 (01/12 0300) Cardiac Rhythm:  [-] Normal sinus rhythm;Heart block (01/12 0743) Resp:  [18] 18 (01/12 0300) BP: (114-135)/(60-97) 123/72 mmHg (01/12 0300) SpO2:  [95 %-98 %] 98 % (01/12 0300) Weight:  [86.4 kg (190 lb 7.6 oz)] 86.4 kg (190 lb 7.6 oz) (01/12 0300)  Physical Exam:  Rhythm:   sinus  Breath sounds: Few bibasilar crackles  Heart sounds:  RRR w/out murmur  Incisions:  Clean and dry  Abdomen:  Soft, non-distended, non-tender  Extremities:  Warm, well-perfused, no edema   Intake/Output from previous day: 01/11 0701 - 01/12 0700 In: 720 [P.O.:720] Out: 400 [Urine:400] Intake/Output this shift: Total I/O In: 120 [P.O.:120] Out: 100 [Urine:100]  Lab Results:  Recent Labs  03/03/13 0242  WBC 11.4*  HGB 9.6*  HCT 27.4*  PLT 120*   BMET:  Recent Labs  03/03/13 0242  NA 139  K 3.4*  CL 100  CO2 27  GLUCOSE 115*  BUN 26*  CREATININE 1.18  CALCIUM 8.5    CBG (last 3)  No results found for this basename: GLUCAP,  in the last 72 hours PT/INR:   Recent Labs  03/05/13 0455  LABPROT 16.0*  INR 1.31    CXR:  N/A  Assessment/Plan: S/P Procedure(s) (LRB): CORONARY ARTERY BYPASS GRAFTING (CABG) (N/A) INTRAOPERATIVE TRANSESOPHAGEAL ECHOCARDIOGRAM (N/A) MAZE (N/A) MITRAL VALVE REPAIR (MVR) (N/A)  Doing very well  POD5 Maintaining NSR w/ stable BP Expected post op acute blood loss anemia, stable Expected post op volume excess, mild, diuresing, weight approaching baseline Expected post op atelectasis, mild Mildly limited mobility with slight instability of gait, present pre op and exacerbated by surgery   Mobilize  PT  Continue coumadin  Start low dose ACE-I and statin  Possible d/c home tomorrow if appropriate home arrangements in place   [facility] H 03/05/2013 9:04 AM

## 2013-03-05 NOTE — Progress Notes (Signed)
CARDIAC REHAB PHASE I   PRE:  Rate/Rhythm: 90 SR  BP:  Supine:   Sitting: 129/66  Standing:    SaO2: 97 RA  MODE:  Ambulation: 1040 ft   POST:  Rate/Rhythm: 94  BP:  Supine:   Sitting: 134/80  Standing:    SaO2: 96 RA 6060-0459 Assisted X 1 and used walker to ambulate. Gait steady with walker. VS stable. Pt would like rolling walker to use at home. As pt walked further his gait seemed to improve. Pt back to recliner after walk with call light in reach.   Rodney Langton RN 03/05/2013 10:23 AM

## 2013-03-05 NOTE — Progress Notes (Signed)
EPW d/c at this time per MD order; VSS; bedrest until 1145; will cont. To monitor.

## 2013-03-05 NOTE — Care Management Note (Signed)
Page 1 of 2   03/06/2013     3:25:26 PM   CARE MANAGEMENT NOTE 03/06/2013  Patient:  [name], [name]   Account Number:  [id number]  Date Initiated:  03/02/2013  Documentation initiated by:  [facility]  Subjective/Objective Assessment:   adm: Chest Pain     Action/Plan:   discharge planning   Anticipated DC Date:  03/06/2013   Anticipated DC Plan:  [place]  CM consult      [facility] Choice  HOME HEALTH   Choice offered to / List presented to:  C-1 Patient   DME arranged  [place]      DME agency  [place] arranged  HH-1 RN  [place].   Status of service:  Completed, signed off Medicare Important Message given?   (If response is "NO", the following Medicare IM given date fields will be blank) Date Medicare IM given:   Date Additional Medicare IM given:    Discharge Disposition:  [place]  Per UR Regulation:  Reviewed for med. necessity/level of care/duration of stay  If discussed at [place] of Stay Meetings, dates discussed:    Comments:  03/06/13 Brooks Stotz,RN,BSN [phone] MET WITH PT AND DAUGHTER, BRYNN TO [place]. DAUGHTER UPSET, STATES "YOU HAVEN'T DONE YOUR JOB AND ORDERED THE CHAIR FROM [facility], IT HASN'T BEEN DELIVERED." EXPLAINED THAT CHAIR HAD BEEN ORDERED, AND [facility] WOULD CONTACT HER FOR DELIVERY.  JUSTIN WITH [facility] WAS CALLED TO ROOM TO ASSIST; DAUGHTER NOW STATING SHE WANTS LIFT CHAIR INSTEAD OF GERI CHAIR.  WE EXPLAINED THAT SHE OR A FAMILY MEMBER WOULD HAVE TO GO PICK OUT THE CHAIR AT [facility]. I OFFERED TO FAX ORDER FOR LIFT CHAIR TO THE RETAIL STORE FOR HER, AS CURRENTLY ALL PA'S ARE IN SURGERY, AND UNABLE TO WRITE RX FOR LIFT CHAIR.  FAXED ORDER FOR LIFT CHAIR TO [phone].([facility] FOR [facility]) 1200 CALLED TO PT'S ROOM, AS DTR FURIOUS, STATING THAT SHE IS GOING TO  CALL MY BOSS, AS I AM NOT DOING MY JOB.  PT STATES I NEED ADDITIONAL TRAINING FOR MY JOB.  DTR STATES I HAVE NOT FAXED THE ORDER FOR CHAIR, AS THEY DO NOT HAVE IT. ASSURED DTR THAT I DID FAX IT, THAT PERHAPS IT HAS NOT MADE IT TO RETAIL STORE YET FROM CENTRAL INTAKE.    SHE STATED THAT I WAS MAKING EXCUSES AND INSISTED ON MY BOSS'S NAME AND PHONE #.  INFORMATION GIVEN.  DTR RELIEVED ME OF MY DUTIES, AND THEN CALLED ME A STUPID BI_CH.  03/05/13 Jillane Po,RN,BSN [phone] PT REQUESTS RW FOR HOME AND GERI CHAIR.  PT STATES WIFE IS IN WHEELCHAIR, BUT IS VERY CAPABLE; COOKS FOR THEM, ETC. GERI CHAIR COVERED BY INSURANCE, AND PT WILL PAY ONLY $26/MO FOR RENTAL.  HE IS HAPPY WITH THAT.  REFERRAL TO [facility] FOR DME NEEDS; PLAN DELIVERY OF GERI CHAIR TO PT HOME ON 1/13.  CONFIRMED WITH PT WIFE AND DAUGHTER BRYNN.[phone])    ATTEMPTED TO SPEAK WITH SON,LARRY IN [place], BUT HE WAS NOT AVAILABLE.  LEFT MESSAGE ON VM.   ([place]).  03/04/13 CM spoke with pt and spouse of pt in room to offer choice.  Pt chooses [facility] to render HHPT/RN services. Address and contact number  were verified.  DME may still be ordered.  Referral faxed to [facility] with probable discharge 1/12-13.  No other CM needs were communicated.  CM will continue to follow for possible DME orders.  Mariane Masters, BSN, CM [phone].

## 2013-03-05 NOTE — Progress Notes (Signed)
Physical Therapy Treatment Patient Details Name: [name]. MRN: [id number] DOB: 18-Apr-1927 Today's Date: 03/05/2013 Time: 2458-0998 PT Time Calculation (min): 31 min  PT Assessment / Plan / Recommendation  History of Present Illness Patient has hx of A.fib but no known hx of CAD. Last stress test was 1983. Patient was doing some unpacking and heavy lifting. He went up and down the stairs and started to noted that he was having chest pain with walking up the stairs and lifting boxes. The pain would last up to 20 min and would go away with rest. Felt like tightness it radiated slightly to left shoulder. Denies shortness of breath or diaphoresis associated with this. This is the most activity he has done in a while. On admission, pt underwent cath and angiogram.  Now s/p CABG.   PT Comments   Pt progressing well towards physical therapy goals. Able to perform standing exercise well with VC's to not bear too much weight through UE's on walker. May be able to progress to [facility] or no AD next session.   Follow Up Recommendations  Home health PT;Supervision for mobility/OOB     Does the patient have the potential to tolerate intense rehabilitation     Barriers to Discharge        Equipment Recommendations  Rolling walker with 5" wheels    Recommendations for Other Services    Frequency Min 3X/week   Progress towards PT Goals Progress towards PT goals: Progressing toward goals  Plan Current plan remains appropriate    Precautions / Restrictions Precautions Precautions: Fall;Sternal Precaution Comments: Pt able to recall 2 sternal precautions. Pt educated on all sternal precautions. Restrictions Weight Bearing Restrictions: No   Pertinent Vitals/Pain Pt reports no pain throughout session.     Mobility  Bed Mobility General bed mobility comments: NT, as pt was received sitting in chair Transfers Overall transfer level: Needs assistance Equipment used: Rolling walker (2  wheeled) Transfers: Sit to/from Stand Sit to Stand: Min guard General transfer comment: VC's for hand placement close to body to maintain sternal precautions. Pt able to use momentum to stand without physical assist.  Ambulation/Gait Ambulation/Gait assistance: Supervision Ambulation Distance (Feet): 500 Feet Assistive device: Rolling walker (2 wheeled) Gait Pattern/deviations: Step-through pattern;Decreased stride length;Trunk flexed Gait velocity: Decreased General Gait Details: Pt wanting to continue ambulating, but gait training ended by therapist for time. Pt cued for walker placement and safety awareness, as he was pushing the walker aside to turn and sit in the chair. Pt often stopped ambulation to talk, and then would continue ambulating.     Exercises General Exercises - Lower Extremity Straight Leg Raises: 15 reps;Standing Hip Flexion/Marching: 15 reps Toe Raises: 15 reps Heel Raises: 15 reps Mini-Sqauts: 10 reps   PT Diagnosis:    PT Problem List:   PT Treatment Interventions:     PT Goals (current goals can now be found in the care plan section) Acute Rehab PT Goals Patient Stated Goal: Get home and be able to do everything I need to do . PT Goal Formulation: With patient Time For Goal Achievement: 03/16/13 Potential to Achieve Goals: Good  Visit Information  Last PT Received On: 03/05/13 Assistance Needed: +1 History of Present Illness: Patient has hx of A.fib but no known hx of CAD. Last stress test was 1983. Patient was doing some unpacking and heavy lifting. He went up and down the stairs and started to noted that he was having chest pain with walking up the  stairs and lifting boxes. The pain would last up to 20 min and would go away with rest. Felt like tightness it radiated slightly to left shoulder. Denies shortness of breath or diaphoresis associated with this. This is the most activity he has done in a while. On admission, pt underwent cath and angiogram.  Now  s/p CABG.    Subjective Data  Subjective: "Let's go a little further." Patient Stated Goal: Get home and be able to do everything I need to do .   Cognition  Cognition Arousal/Alertness: Awake/alert Behavior During Therapy: WFL for tasks assessed/performed Overall Cognitive Status: Within Functional Limits for tasks assessed    Balance  Balance Overall balance assessment: No apparent balance deficits (not formally assessed)  End of Session PT - End of Session Equipment Utilized During Treatment: Gait belt Activity Tolerance: Patient tolerated treatment well Patient left: in chair;with call bell/phone within reach;with family/visitor present Nurse Communication: Mobility status   GP     Jolyn Lent 03/05/2013, 3:38 PM  Jolyn Lent, [place], DPT [phone]

## 2013-03-05 NOTE — Discharge Summary (Signed)
[address]       [place] [address]             [phone]              Discharge Summary  Name: [name]. DOB: 1927/05/16 80 y.o. MRN: [id number]   Admission Date: 02/25/2013 Discharge Date:     Admitting Diagnosis:   Discharge Diagnosis:  Severe 3 vessel coronary artery disease Chronic persistent atrial fibrillation Moderate to severe mitral regurgitation Mild to moderate aortic insufficiency Expected postop blood loss anemia  Past Medical History  Diagnosis Date  . Personal history of colonic polyps     tubular adenoma  . Hypertension   . Irritable bowel syndrome   . Prostate cancer 1991    sees Dr. Carlota Raspberry at [facility], observation only   . Atrial fibrillation   . S/P mitral valve repair, maze procedure, and CABG x2 02/28/2013    Complex valvuloplasty including triangular resection of posterior leaflet, 30 mm Sorin Memo 3D ring annuloplasty  . S/P CABG x 2 02/28/2013    LIMA to LAD, SVG to OM1, EVH via right thigh  . S/P Maze operation for atrial fibrillation 02/28/2013    Complete bilateral atrial lesion set using bipolar radiofrequency and cryothermy ablation with clipping of LA appendage      Procedures: 02/28/2013 Coronary Artery Bypass Grafting x 2  Left Internal Mammary Artery to Distal Left Anterior Descending Coronary Artery  Saphenous Vein Graft to First Obtuse Marginal Branch of Left Circumflex Endoscopic Vein Harvest from Right Thigh  Mitral Valve Repair Complex valvuloplasty including triangular resection of flail posterior leaflet (P2)  Plication of prolapsing P3 portion of posterior leaflet  Sorin Memo 3D ring annuloplasty (size 64mm, catalog #[id number], serial #[id number])  Maze Procedure  complete bilateral atrial lesion set using bipolar radiofrequency and cryothermy ablation  clipping of left atrial appendage (Atriclip size 69mm)     HPI:  The patient is a 80 y.o. male with a known history of atrial fibrillation  followed by Dr. Harrington Challenger.  He presented to the ER at [facility] on 02/25/2013 complaining of new onset chest pain with exertion.   He related having intermittent pain over the preceding few days, worsening on the day of admission after he lifted some boxes.  The pain radiated to his left shoulder and was relieved with rest.  His troponin was mildly elevated on arrival, and he was admitted for further evaluation. He was seen by cardiology, ruled out for myocardial infarction, and subsequently was transferred to [facility] for cardiac catheterization.   Hospital Course:  The patient was admitted to [facility] on 02/25/2013.  Cardiac catheterization was performed on 02/26/2013, and revealed severe 3 vessel coronary artery disease with preserved left ventricular function. A cardiac surgery consult was requested for consideration of surgical revascularization.  Dr. Roxy Manns saw the patient and reviewed his films. He felt that the patient would benefit from CABG as well as Maze procedure. All risks, benefits and alternatives of surgery were explained in detail, and the patient agreed to proceed.   The patient was taken to the operating room and underwent the above procedure. Intraoperatively, transesophageal echocardiogram revealed significant mitral valve prolapse with moderate to severe mitral regurgitation, therefore, a mitral valve repair was also performed.   The postoperative course has generally been uneventful.  He was extubated on postop day 1, and remained in the ICU for further monitoring.  He was initially maintained on  Dopamine and Milrinone drips, but these were ultimately weaned and discontinued.  By postop day 2, he was ready for transfer to stepdown.    Overall, he has progressed well.  He has maintained sinus rhythm post op, and blood pressures have been stable.  Incisions are healing well.  He has been mildly volume overloaded, and was started on Lasix, to which he is responding well.  He is ambulating in  the halls with physical therapy and cardiac rehab and is progressing well with mobility.  He is tolerating a regular diet.  He has been evaluated on today's date and is stable for discharge home.  Recent vital signs:  Filed Vitals:   03/05/13 0300  BP: 123/72  Pulse: 82  Temp: 98.7 F (37.1 C)  Resp: 18    Recent laboratory studies:  CBC: Recent Labs  03/03/13 0242  WBC 11.4*  HGB 9.6*  HCT 27.4*  PLT 120*   BMET:  Recent Labs  03/03/13 0242  NA 139  K 3.4*  CL 100  CO2 27  GLUCOSE 115*  BUN 26*  CREATININE 1.18  CALCIUM 8.5    PT/INR:  Recent Labs  03/05/13 0455  LABPROT 16.0*  INR 1.31     Discharge Medications:      Medication List    STOP taking these medications       ibuprofen 200 MG tablet  Commonly known as:  ADVIL,MOTRIN      TAKE these medications       amiodarone 200 MG tablet  Commonly known as:  PACERONE  Take 1 tablet (200 mg total) by mouth 2 (two) times daily after a meal. For 1 week, then decrease to 200 mg daily     aspirin 325 MG tablet  Take 325 mg by mouth daily.     atorvastatin 10 MG tablet  Commonly known as:  LIPITOR  Take 1 tablet (10 mg total) by mouth daily at 6 PM.     fish oil-omega-3 fatty acids 1000 MG capsule  Take 1 g by mouth every morning.     furosemide 40 MG tablet  Commonly known as:  LASIX  Take 1 tablet (40 mg total) by mouth daily. For 3 Days     levothyroxine 50 MCG tablet  Commonly known as:  SYNTHROID, LEVOTHROID  Take 1 tablet (50 mcg total) by mouth daily.     lisinopril 2.5 MG tablet  Commonly known as:  PRINIVIL,ZESTRIL  Take 1 tablet (2.5 mg total) by mouth daily.     metoprolol tartrate 25 MG tablet  Commonly known as:  LOPRESSOR  Take 0.5 tablets (12.5 mg total) by mouth 2 (two) times daily.     potassium chloride SA 20 MEQ tablet  Commonly known as:  K-DUR,KLOR-CON  Take 1 tablet (20 mEq total) by mouth daily. For 3 Days     traMADol 50 MG tablet  Commonly known as:  ULTRAM   Take 1 tablet (50 mg total) by mouth every 6 (six) hours as needed for moderate pain.     vitamin C 500 MG tablet  Commonly known as:  ASCORBIC ACID  Take 500 mg by mouth every morning.     Vitamin D 1000 UNITS capsule  Take 2,000 Units by mouth every morning.     warfarin 2.5 MG tablet  Commonly known as:  COUMADIN  Take 1 tablet (2.5 mg total) by mouth daily at 6 PM.       The patient has been discharged  on:   1.Beta Blocker:  Yes [ x  ]                              No   [   ]                              If No, reason:  2.Ace Inhibitor/ARB: Yes [x   ]                                     No  [    ]                                     If No, reason:  3.Statin:   Yes [x   ]                  No  [   ]                  If No, reason:  4.Ecasa:  Yes  [ x  ]                  No   [   ]                  If No, reason:     Discharge Instructions:  The patient is to refrain from driving, heavy lifting or strenuous activity.  May shower daily and clean incisions with soap and water.  May resume regular diet.   Follow Up:    COLLINS,GINA H 03/05/2013, 9:18 AM

## 2013-03-05 NOTE — Discharge Instructions (Addendum)
Information on my medicine - Coumadin   (Warfarin)  This medication education was reviewed with me or my healthcare representative as part of my discharge preparation.  The pharmacist that spoke with me during my hospital stay was:  Bajbus, Lauren, RPH  Why was Coumadin prescribed for you? Coumadin was prescribed for you because you have a blood clot or a medical condition that can cause an increased risk of forming blood clots. Blood clots can cause serious health problems by blocking the flow of blood to the heart, lung, or brain. Coumadin can prevent harmful blood clots from forming. As a reminder your indication for Coumadin is:   Stroke Prevention Because Of Atrial Fibrillation  What test will check on my response to Coumadin? While on Coumadin (warfarin) you will need to have an INR test regularly to ensure that your dose is keeping you in the desired range. The INR (international normalized ratio) number is calculated from the result of the laboratory test called prothrombin time (PT).  If an INR APPOINTMENT HAS NOT ALREADY BEEN MADE FOR YOU please schedule an appointment to have this lab work done by your health care provider within 7 days. Your INR goal is usually a number between:  2 to 3 or your provider may give you a more narrow range like 2-2.5.  Ask your health care provider during an office visit what your goal INR is.  What  do you need to  know  About  COUMADIN? Take Coumadin (warfarin) exactly as prescribed by your healthcare provider about the same time each day.  DO NOT stop taking without talking to the doctor who prescribed the medication.  Stopping without other blood clot prevention medication to take the place of Coumadin may increase your risk of developing a new clot or stroke.  Get refills before you run out.  What do you do if you miss a dose? If you miss a dose, take it as soon as you remember on the same day then continue your regularly scheduled regimen the next  day.  Do not take two doses of Coumadin at the same time.  Important Safety Information A possible side effect of Coumadin (Warfarin) is an increased risk of bleeding. You should call your healthcare provider right away if you experience any of the following:   Bleeding from an injury or your nose that does not stop.   Unusual colored urine (red or dark Flesch) or unusual colored stools (red or black).   Unusual bruising for unknown reasons.   A serious fall or if you hit your head (even if there is no bleeding).  Some foods or medicines interact with Coumadin (warfarin) and might alter your response to warfarin. To help avoid this:   Eat a balanced diet, maintaining a consistent amount of Vitamin K.   Notify your provider about major diet changes you plan to make.   Avoid alcohol or limit your intake to 1 drink for women and 2 drinks for men per day. (1 drink is 5 oz. wine, 12 oz. beer, or 1.5 oz. liquor.)  Make sure that ANY health care provider who prescribes medication for you knows that you are taking Coumadin (warfarin).  Also make sure the healthcare provider who is monitoring your Coumadin knows when you have started a new medication including herbals and non-prescription products.  Coumadin (Warfarin)  Major Drug Interactions  Increased Warfarin Effect Decreased Warfarin Effect  Alcohol (large quantities) Antibiotics (esp. Septra/Bactrim, Flagyl, Cipro) Amiodarone (Cordarone) Aspirin (ASA)  Cimetidine (Tagamet) Megestrol (Megace) NSAIDs (ibuprofen, naproxen, etc.) Piroxicam (Feldene) Propafenone (Rythmol SR) Propranolol (Inderal) Isoniazid (INH) Posaconazole (Noxafil) Barbiturates (Phenobarbital) Carbamazepine (Tegretol) Chlordiazepoxide (Librium) Cholestyramine (Questran) Griseofulvin Oral Contraceptives Rifampin Sucralfate (Carafate) Vitamin K   Coumadin (Warfarin) Major Herbal Interactions  Increased Warfarin Effect Decreased Warfarin Effect   Garlic Ginseng Ginkgo biloba Coenzyme Q10 Green tea St. Johns wort    Coumadin (Warfarin) FOOD Interactions  Eat a consistent number of servings per week of foods HIGH in Vitamin K (1 serving =  cup)  Collards (cooked, or boiled & drained) Kale (cooked, or boiled & drained) Mustard greens (cooked, or boiled & drained) Parsley *serving size only =  cup Spinach (cooked, or boiled & drained) Swiss chard (cooked, or boiled & drained) Turnip greens (cooked, or boiled & drained)  Eat a consistent number of servings per week of foods MEDIUM-HIGH in Vitamin K (1 serving = 1 cup)  Asparagus (cooked, or boiled & drained) Broccoli (cooked, boiled & drained, or raw & chopped) Brussel sprouts (cooked, or boiled & drained) *serving size only =  cup Lettuce, raw (green leaf, endive, romaine) Spinach, raw Turnip greens, raw & chopped   These websites have more information on Coumadin (warfarin):  FailFactory.se; VeganReport.com.au;  Coronary Artery Bypass Grafting, Care After Refer to this sheet in the next few weeks. These instructions provide you with information on caring for yourself after your procedure. Your health care provider may also give you more specific instructions. Your treatment has been planned according to current medical practices, but problems sometimes occur. Call your health care provider if you have any problems or questions after your procedure. WHAT TO EXPECT AFTER THE PROCEDURE Recovery from surgery will be different for everyone. Some people feel well after 3 or 4 weeks, while for others it takes longer. After your procedure, it is typical to have the following:  Nausea and a lack of appetite.   Constipation.  Weakness and fatigue.   Depression or irritability.   Pain or discomfort at your incision site. HOME CARE INSTRUCTIONS  Only take over-the-counter or prescription medicines as directed by your health care provider. Take all  medicines exactly as directed. Do not stop taking medicines or start any new medicines without first checking with your health care provider.   Take your pulse as directed by your health care provider.  Perform deep breathing as directed by your health care provider. If you were given a device called an incentive spirometer, use it to practice deep breathing several times a day. Support your chest with a pillow or your arms when you take deep breaths or cough.  Keep incision areas clean, dry, and protected. Remove or change any bandages (dressings) only as directed by your health care provider. You may have skin adhesive strips over the incision areas. Do not take the strips off. They will fall off on their own.  Check incision areas daily for any swelling, redness, or drainage.  If incisions were made in your legs, do the following:  Avoid crossing your legs.   Avoid sitting for long periods of time. Change positions every 30 minutes.   Elevate your legs when you are sitting.   Wear compression stockings as directed by your health care provider. These stockings help keep blood clots from forming in your legs.  Take showers once your health care provider approves. Until then, only take sponge baths. Pat incisions dry. Do not rub incisions with a washcloth or towel. Do not take tub baths or go  swimming until your health care provider approves.  Eat foods that are high in fiber, such as raw fruits and vegetables, whole grains, beans, and nuts. Meats should be lean cut. Avoid canned, processed, and fried foods.  Drink enough fluids to keep your urine clear or pale yellow.  Weigh yourself every day. This helps identify if you are retaining fluid that may make your heart and lungs work harder.   Rest and limit activity as directed by your health care provider. You may be instructed to:  Stop any activity at once if you have chest pain, shortness of breath, irregular heartbeats, or  dizziness. Get help right away if you have any of these symptoms.  Move around frequently for short periods or take short walks as directed by your health care provider. Increase your activities gradually. You may need physical therapy or cardiac rehabilitation to help strengthen your muscles and build your endurance.  Avoid lifting, pushing, or pulling anything heavier than 10 lb (4.5 kg) for at least 6 weeks after surgery.  Do not drive until your health care provider approves.  Ask your health care provider when you may return to work and resume sexual activity.  Follow up with your health care provider as directed.  SEEK MEDICAL CARE IF:  You have swelling, redness, increasing pain, or drainage at the site of an incision.   You develop a fever.   You have swelling in your ankles or legs.   You have pain in your legs.   You have weight gain of 2 or more pounds a day.  You are nauseous or vomit.  You have diarrhea. SEEK IMMEDIATE MEDICAL CARE IF:  You have chest pain that goes to your jaw or arms.  You have shortness of breath.   You have a fast or irregular heartbeat.   You notice a "clicking" in your breastbone (sternum) when you move.   You have numbness or weakness in your arms or legs.  You feel dizzy or lightheaded.  MAKE SURE YOU:  Understand these instructions.  Will watch your condition.  Will get help right away if you are not doing well or get worse. Document Released: 08/28/2004 Document Revised: 10/11/2012 Document Reviewed: 07/18/2012 [facility] Patient Information 2014 [employer], [place].  Endoscopic Saphenous Vein Harvesting Care After Refer to this sheet in the next few weeks. These instructions provide you with information on caring for yourself after your procedure. Your caregiver may also give you more specific instructions. Your treatment has been planned according to current medical practices, but problems sometimes occur. Call your  caregiver if you have any problems or questions after your procedure. HOME CARE INSTRUCTIONS Medicine  Take whatever pain medicine your surgeon prescribes. Follow the directions carefully. Do not take over-the-counter pain medicine unless your surgeon says it is okay. Some pain medicine can cause bleeding problems for several weeks after surgery.  Follow your surgeon's instructions about driving. You will probably not be permitted to drive after heart surgery.  Take any medicines your surgeon prescribes. Any medicines you took before your heart surgery should be checked with your caregiver before you start taking them again. Wound care  Ask your surgeon how long you should keep wearing your elastic bandage or stocking.  Check the area around your surgical cuts (incisions) whenever your bandages (dressings) are changed. Look for any redness or swelling.  You will need to return to have the stitches (sutures) or staples taken out. Ask your surgeon when to do that.  Ask your surgeon when you can shower or bathe. Activity  Try to keep your legs raised when you are sitting.  Do any exercises your caregivers have given you. These may include deep breathing exercises, coughing, walking, or other exercises. SEEK MEDICAL CARE IF:  You have any questions about your medicines.  You have more leg pain, especially if your pain medicine stops working.  New or growing bruises develop on your leg.  Your leg swells, feels tight, or becomes red.  You have numbness in your leg. SEEK IMMEDIATE MEDICAL CARE IF:  Your pain gets much worse.  Blood or fluid leaks from any of the incisions.  Your incisions become warm, swollen, or red.  You have chest pain.  You have trouble breathing.  You have a fever.  You have more pain near your leg incision. MAKE SURE YOU:  Understand these instructions.  Will watch your condition.  Will get help right away if you are not doing well or get  worse. Document Released: 10/21/2010 Document Revised: 05/03/2011 Document Reviewed: 10/21/2010 [facility] Patient Information 2014 [place], [place].

## 2013-03-05 NOTE — Addendum Note (Signed)
Addendum created 03/05/13 1656 by Josephine Igo, CRNA   Modules edited: Anesthesia Medication Administration

## 2013-03-05 NOTE — Progress Notes (Signed)
MD paged at this time to clarify whether pt may transfer to regular 2w bed per request of patient placement; will await callback.

## 2013-03-05 NOTE — Addendum Note (Signed)
Addendum created 03/05/13 1652 by Josephine Igo, CRNA   Modules edited: Anesthesia Medication Administration

## 2013-03-05 NOTE — Progress Notes (Signed)
Patient Name: [name]. Date of Encounter: 03/05/2013    Length of Stay: 8  SUBJECTIVE  The patient feels well, denies CP or SOB, started ambulating.  CURRENT MEDS . acetaminophen  1,000 mg Oral Q6H  . amiodarone  200 mg Oral BID PC  . aspirin EC  81 mg Oral Daily  . atorvastatin  10 mg Oral q1800  . enoxaparin (LOVENOX) injection  40 mg Subcutaneous Q24H  . furosemide  40 mg Oral BID  . [START ON 03/06/2013] furosemide  40 mg Oral Daily  . levothyroxine  50 mcg Oral QAC breakfast  . lisinopril  2.5 mg Oral Daily  . metoprolol  12.5 mg Oral BID  . pantoprazole  40 mg Oral Daily  . [START ON 03/06/2013] potassium chloride  20 mEq Oral Daily  . potassium chloride  40 mEq Oral BID  . sodium chloride  3 mL Intravenous Q12H  . sodium chloride  3 mL Intravenous Q12H  . warfarin  2.5 mg Oral q1800  . Warfarin - Pharmacist Dosing Inpatient   Does not apply q1800    OBJECTIVE  Filed Vitals:   03/04/13 1958 03/04/13 2214 03/05/13 0300 03/05/13 1037  BP: 120/74 124/60 123/72 126/62  Pulse: 81 78 82 82  Temp: 98.8 F (37.1 C)  98.7 F (37.1 C)   TempSrc: Oral  Oral   Resp: 18  18   Height:      Weight:   190 lb 7.6 oz (86.4 kg)   SpO2: 97%  98%     Intake/Output Summary (Last 24 hours) at 03/05/13 1040 Last data filed at 03/05/13 0941  Gross per 24 hour  Intake    600 ml  Output    600 ml  Net      0 ml   Filed Weights   03/03/13 0356 03/04/13 0457 03/05/13 0300  Weight: 193 lb 12.6 oz (87.9 kg) 192 lb (87.091 kg) 190 lb 7.6 oz (86.4 kg)    PHYSICAL EXAM  General: Pleasant, NAD. Neuro: Alert and oriented X 3. Moves all extremities spontaneously. Psych: Normal affect. HEENT:  Normal  Neck: Supple without bruits or JVD. Lungs:  Resp regular and unlabored, mild rales B/L Heart: RRR no s3, s4, or murmurs. Abdomen: Soft, non-tender, non-distended, BS + x 4.  Extremities: No clubbing, cyanosis or edema. DP/PT/Radials 2+ and equal  bilaterally.  Accessory Clinical Findings  CBC  Recent Labs  03/03/13 0242  WBC 11.4*  HGB 9.6*  HCT 27.4*  MCV 90.1  PLT 937*   Basic Metabolic Panel  Recent Labs  03/03/13 0242  NA 139  K 3.4*  CL 100  CO2 27  GLUCOSE 115*  BUN 26*  CREATININE 1.18  CALCIUM 8.5   Radiology/Studies  Dg Chest 2 View  03/03/2013   CLINICAL DATA:  Atelectasis.  EXAM: CHEST  2 VIEW  COMPARISON:  Plain film of the chest 03/02/2013.  FINDINGS: Right IJ sheath has been removed. The patient is status post CABG. There are small bilateral pleural effusions and basilar atelectasis. Interstitial edema seen on the prior study is resolved. Hiatal hernia is noted.  IMPRESSION: Small bilateral pleural effusions and basilar atelectasis.  Resolved interstitial edema.  Hiatal hernia.   Electronically Signed   By: Inge Rise M.D.   On: 03/03/2013 04:28    TELE: SR 70-80 BPM    ASSESSMENT AND PLAN  Principal Problem:   S/P mitral valve repair, maze procedure, and CABG x2 Active Problems:  HYPERTENSION   Chest pain on exertion   Atrial fibrillation   Unstable angina   S/P CABG x 2   S/P Maze operation for atrial fibrillation  1. CAD, S/P mitral valve repair, maze procedure, and CABG x2, post op day #5, doing well, anticipated discharge tomorrow Continue ASA, atorvastatin, metoprolol and ACEI.  2. HTN - controlled  3. Parox A-fib - continue amiodarone and coumadine, amiodarone should be changed to once daily in 1 week  4. Post op fluid overload, preserved LFEV, continue the same dose of Lasix until the next clinic visit  5. Hypokalemia- we will replace  The patient should follow with Dr Harrington Challenger in her clinic within the 7-10 days post dicharge   Signed, Ena Dawley, H MD, [facility] 03/05/2013

## 2013-03-06 DIAGNOSIS — I2510 Atherosclerotic heart disease of native coronary artery without angina pectoris: Secondary | ICD-10-CM

## 2013-03-06 LAB — PROTIME-INR
INR: 1.4 (ref 0.00–1.49)
Prothrombin Time: 16.8 seconds — ABNORMAL HIGH (ref 11.6–15.2)

## 2013-03-06 MED ORDER — TRAMADOL HCL 50 MG PO TABS: 50.0000 mg | ORAL_TABLET | Freq: Four times a day (QID) | ORAL | Status: DC | PRN

## 2013-03-06 MED ORDER — FUROSEMIDE 40 MG PO TABS: 40.0000 mg | ORAL_TABLET | Freq: Every day | ORAL | Status: DC

## 2013-03-06 MED ORDER — METOPROLOL TARTRATE 25 MG PO TABS: 12.5000 mg | ORAL_TABLET | Freq: Two times a day (BID) | ORAL | Status: DC

## 2013-03-06 MED ORDER — ATORVASTATIN CALCIUM 10 MG PO TABS: 10.0000 mg | ORAL_TABLET | Freq: Every day | ORAL | Status: DC

## 2013-03-06 MED ORDER — AMIODARONE HCL 200 MG PO TABS: 200.0000 mg | ORAL_TABLET | Freq: Two times a day (BID) | ORAL | Status: DC

## 2013-03-06 MED ORDER — LISINOPRIL 2.5 MG PO TABS: 2.5000 mg | ORAL_TABLET | Freq: Every day | ORAL | Status: DC

## 2013-03-06 MED ORDER — POTASSIUM CHLORIDE CRYS ER 20 MEQ PO TBCR: 20.0000 meq | EXTENDED_RELEASE_TABLET | Freq: Every day | ORAL | Status: DC

## 2013-03-06 MED ORDER — WARFARIN SODIUM 2.5 MG PO TABS: 2.5000 mg | ORAL_TABLET | Freq: Every day | ORAL | Status: DC

## 2013-03-06 NOTE — Progress Notes (Signed)
      [address]       [place] [address]             [phone]     CARDIOTHORACIC SURGERY PROGRESS NOTE  6 Days Post-Op  S/P Procedure(s) (LRB): CORONARY ARTERY BYPASS GRAFTING (CABG) (N/A) INTRAOPERATIVE TRANSESOPHAGEAL ECHOCARDIOGRAM (N/A) MAZE (N/A) MITRAL VALVE REPAIR (MVR) (N/A)  Subjective: Feels well.   Wants to go home.  Objective: Vital signs in last 24 hours: Temp:  [98.3 F (36.8 C)-98.9 F (37.2 C)] 98.3 F (36.8 C) (01/13 0346) Pulse Rate:  [68-82] 69 (01/13 0346) Cardiac Rhythm:  [-] Atrial fibrillation (01/12 2025) Resp:  [18] 18 (01/13 0346) BP: (98-126)/(51-83) 102/58 mmHg (01/13 0346) SpO2:  [94 %-97 %] 96 % (01/13 0346) Weight:  [86.047 kg (189 lb 11.2 oz)] 86.047 kg (189 lb 11.2 oz) (01/13 0346)  Physical Exam:  Rhythm:   sinus  Breath sounds: clear  Heart sounds:  RRR  Incisions:  Clean and dry  Abdomen:  soft  Extremities:  warm   Intake/Output from previous day: 01/12 0701 - 01/13 0700 In: 1320 [P.O.:1320] Out: 1050 [Urine:1050] Intake/Output this shift:    Lab Results: No results found for this basename: WBC, HGB, HCT, PLT,  in the last 72 hours BMET: No results found for this basename: NA, K, CL, CO2, GLUCOSE, BUN, CREATININE, CALCIUM,  in the last 72 hours  CBG (last 3)  No results found for this basename: GLUCAP,  in the last 72 hours PT/INR:   Recent Labs  03/06/13 0511  LABPROT 16.8*  INR 1.40    CXR:  N/A  Assessment/Plan: S/P Procedure(s) (LRB): CORONARY ARTERY BYPASS GRAFTING (CABG) (N/A) INTRAOPERATIVE TRANSESOPHAGEAL ECHOCARDIOGRAM (N/A) MAZE (N/A) MITRAL VALVE REPAIR (MVR) (N/A)  Doing well D/C home today  OWEN,CLARENCE H 03/06/2013 10:19 AM

## 2013-03-06 NOTE — Progress Notes (Signed)
Pt/family given discharge instructions, medication lists, follow up appointments, and when to call the doctor.  Pt/family verbalizes understanding. Pt given signs and symptoms of infection. Called volunteers to transport daughter and patient to main entrance for discharge.

## 2013-03-06 NOTE — Progress Notes (Signed)
Subjective:  Up in chair. No complaints, anxious to go home.  Objective:  Vital Signs in the last 24 hours: Temp:  [98.3 F (36.8 C)-98.9 F (37.2 C)] 98.3 F (36.8 C) (01/13 0346) Pulse Rate:  [68-82] 69 (01/13 0346) Resp:  [18] 18 (01/13 0346) BP: (98-126)/(51-83) 102/58 mmHg (01/13 0346) SpO2:  [94 %-97 %] 96 % (01/13 0346) Weight:  [189 lb 11.2 oz (86.047 kg)] 189 lb 11.2 oz (86.047 kg) (01/13 0346)  Intake/Output from previous day:  Intake/Output Summary (Last 24 hours) at 03/06/13 0851 Last data filed at 03/06/13 0500  Gross per 24 hour  Intake   1200 ml  Output    950 ml  Net    250 ml    Physical Exam: General appearance: alert, cooperative and no distress Lungs: decreased breath sounds Lt base. Heart: irregularly irregular rhythm   Rate: 70  Rhythm: atrial fibrillation  Lab Results: No results found for this basename: WBC, HGB, PLT,  in the last 72 hours No results found for this basename: NA, K, CL, CO2, GLUCOSE, BUN, CREATININE,  in the last 72 hours No results found for this basename: TROPONINI, CK, MB,  in the last 72 hours  Recent Labs  03/06/13 0511  INR 1.40    Imaging: Imaging results have been reviewed  Cardiac Studies:  Assessment/Plan:   Principal Problem:   Unstable angina Active Problems:   S/P mitral valve repair, maze procedure, and CABG x2 02/29/12   PAF- pre and post op, now on Amiodarone   CAD - LIMA-LAD, SVG-OM Feb 29 2012   ADENOCARCINOMA, PROSTATE, GLEASON GRADE 3   HYPERLIPIDEMIA   HYPERTENSION    PLAN:  He had seen Dr Harrington Challenger in 2012 for PAF and Coumadin had been recommended but he didn't want to take it. He is agreeable to short term Coumadin for now. Decrease Amiodarone to 200 mg daily in 7 days per Dr Meda Coffee. We will arrange follow up with Dr Harrington Challenger.   Kerin Ransom PA-C [facility] [phone] 03/06/2013, 8:51 AM

## 2013-03-06 NOTE — Progress Notes (Signed)
Pt and daughter were present for discharge instructions. Both were upset that chair had not been delivered to home.  I tried to explain process but daughter "did not want to hear any excuses".  Family expressed that they had this same problem at another facility and they knew the only way to get it taken care of was to contact the [facility]. Daughter asked that I find phone number for CM Julie's superior. Unable to resolve issue as family was certain only management could take care of their issues. [name]

## 2013-03-06 NOTE — Progress Notes (Signed)
Removed CT sutures and applied benzoin and 1/2 " steri strips.  Pt tolerated procedure well. Pt given signs and symptoms of infection. [name]

## 2013-03-06 NOTE — Progress Notes (Signed)
Ena Dawley, Lemmie Evens 03/06/2013

## 2013-03-06 NOTE — Progress Notes (Signed)
[phone] Cardiac Rehab Completed discharge education with pt and daughter. They voice understanding. We have pt signed up for Outpt. CRP in [place]. Deon Pilling, RN 03/06/2013 11:52 AM

## 2013-03-06 NOTE — Progress Notes (Signed)
Physical Therapy Treatment Patient Details Name: [name]. MRN: [id number] DOB: 12/14/1927 Today's Date: 03/06/2013 Time: 1308-6578 PT Time Calculation (min): 19 min  PT Assessment / Plan / Recommendation  History of Present Illness Patient has hx of A.fib but no known hx of CAD. Last stress test was 1983. Patient was doing some unpacking and heavy lifting. He went up and down the stairs and started to noted that he was having chest pain with walking up the stairs and lifting boxes. The pain would last up to 20 min and would go away with rest. Felt like tightness it radiated slightly to left shoulder. Denies shortness of breath or diaphoresis associated with this. This is the most activity he has done in a while. On admission, pt underwent cath and angiogram.  Now s/p CABG.   PT Comments   Pt ready for d/c once ezuipment is wrapped up.  He will benefit from [employer] to address strength endurance and precautions.   Follow Up Recommendations  Home health PT;Supervision for mobility/OOB     Does the patient have the potential to tolerate intense rehabilitation     Barriers to Discharge        Equipment Recommendations  Rolling walker with 5" wheels    Recommendations for Other Services    Frequency Min 3X/week   Progress towards PT Goals Progress towards PT goals: Progressing toward goals  Plan Current plan remains appropriate    Precautions / Restrictions Precautions Precautions: Fall;Sternal   Pertinent Vitals/Pain     Mobility  Transfers Overall transfer level: Needs assistance Transfers: Sit to/from Stand Sit to Stand: Min guard General transfer comment: cues to be more aware of sternal precautions Ambulation/Gait Ambulation/Gait assistance: Supervision Ambulation Distance (Feet): 450 Feet Assistive device: Rolling walker (2 wheeled) Gait Pattern/deviations: Step-through pattern;Decreased stride length;Trunk flexed Gait velocity: Decreased Stairs:  (pt deferred  stairs)    Exercises     PT Diagnosis:    PT Problem List:   PT Treatment Interventions:     PT Goals (current goals can now be found in the care plan section) Acute Rehab PT Goals PT Goal Formulation: With patient Time For Goal Achievement: 03/16/13 Potential to Achieve Goals: Good  Visit Information  Last PT Received On: 03/06/13 Assistance Needed: +1 History of Present Illness: Patient has hx of A.fib but no known hx of CAD. Last stress test was 1983. Patient was doing some unpacking and heavy lifting. He went up and down the stairs and started to noted that he was having chest pain with walking up the stairs and lifting boxes. The pain would last up to 20 min and would go away with rest. Felt like tightness it radiated slightly to left shoulder. Denies shortness of breath or diaphoresis associated with this. This is the most activity he has done in a while. On admission, pt underwent cath and angiogram.  Now s/p CABG.    Subjective Data  Subjective: Let's walk and check the height of my walker   Cognition  Cognition Arousal/Alertness: Awake/alert Behavior During Therapy: WFL for tasks assessed/performed Overall Cognitive Status: Within Functional Limits for tasks assessed    Balance  Balance Overall balance assessment: No apparent balance deficits (not formally assessed)  End of Session PT - End of Session Activity Tolerance: Patient tolerated treatment well Patient left: in chair;with family/visitor present Nurse Communication: Mobility status   GP     Lorissa Kishbaugh, Tessie Fass 03/06/2013, 12:44 PM 03/06/2013  Donnella Sham, [place] [phone]  (  pager)

## 2013-03-09 ENCOUNTER — Telehealth: Payer: Self-pay | Admitting: Cardiology

## 2013-03-09 NOTE — Telephone Encounter (Signed)
Returned call to Shirlean Mylar.  Informed pt is scheduled for INR check on 1.20.15 at the [facility] location and is followed by Dr. Kathlen Mody at that office.  Verbalized understanding.

## 2013-03-09 NOTE — Telephone Encounter (Signed)
Dr.Harding saw the patient in the hospital and did a heart cath on him .[name] He was sent home on coumadin .[name] No orders to check inr . Wants to know if Dr. Ellyn Hack wants them to check that( Does not think patient has been seen in office as of yet ) .[name] Please call Shirlean Mylar at [phone] if you have any questions    Thanks.[name]

## 2013-03-12 ENCOUNTER — Emergency Department (HOSPITAL_COMMUNITY): Payer: Medicare HMO

## 2013-03-12 ENCOUNTER — Inpatient Hospital Stay (HOSPITAL_COMMUNITY): Payer: Medicare HMO

## 2013-03-12 ENCOUNTER — Inpatient Hospital Stay (HOSPITAL_COMMUNITY)
Admission: EM | Admit: 2013-03-12 | Discharge: 2013-03-14 | Disposition: A | Discharge status: Skilled Nursing Facility | Payer: Medicare HMO | Source: Home / Self Care | Attending: Internal Medicine | Admitting: Internal Medicine

## 2013-03-12 ENCOUNTER — Encounter (HOSPITAL_COMMUNITY): Payer: Self-pay | Admitting: Thoracic Surgery (Cardiothoracic Vascular Surgery)

## 2013-03-12 DIAGNOSIS — D126 Benign neoplasm of colon, unspecified: Secondary | ICD-10-CM

## 2013-03-12 DIAGNOSIS — I509 Heart failure, unspecified: Secondary | ICD-10-CM

## 2013-03-12 DIAGNOSIS — Z1211 Encounter for screening for malignant neoplasm of colon: Secondary | ICD-10-CM

## 2013-03-12 DIAGNOSIS — E039 Hypothyroidism, unspecified: Secondary | ICD-10-CM

## 2013-03-12 DIAGNOSIS — R55 Syncope and collapse: Secondary | ICD-10-CM

## 2013-03-12 DIAGNOSIS — I2510 Atherosclerotic heart disease of native coronary artery without angina pectoris: Principal | ICD-10-CM

## 2013-03-12 DIAGNOSIS — Z9889 Other specified postprocedural states: Secondary | ICD-10-CM

## 2013-03-12 DIAGNOSIS — K219 Gastro-esophageal reflux disease without esophagitis: Secondary | ICD-10-CM

## 2013-03-12 DIAGNOSIS — Z951 Presence of aortocoronary bypass graft: Secondary | ICD-10-CM

## 2013-03-12 DIAGNOSIS — R972 Elevated prostate specific antigen [PSA]: Secondary | ICD-10-CM

## 2013-03-12 DIAGNOSIS — I200 Unstable angina: Secondary | ICD-10-CM

## 2013-03-12 DIAGNOSIS — K5730 Diverticulosis of large intestine without perforation or abscess without bleeding: Secondary | ICD-10-CM

## 2013-03-12 DIAGNOSIS — J90 Pleural effusion, not elsewhere classified: Secondary | ICD-10-CM

## 2013-03-12 DIAGNOSIS — I959 Hypotension, unspecified: Secondary | ICD-10-CM

## 2013-03-12 DIAGNOSIS — M129 Arthropathy, unspecified: Secondary | ICD-10-CM

## 2013-03-12 DIAGNOSIS — I10 Essential (primary) hypertension: Secondary | ICD-10-CM

## 2013-03-12 DIAGNOSIS — L723 Sebaceous cyst: Secondary | ICD-10-CM

## 2013-03-12 DIAGNOSIS — Z860100 Personal history of colon polyps, unspecified: Secondary | ICD-10-CM

## 2013-03-12 DIAGNOSIS — T50995A Adverse effect of other drugs, medicaments and biological substances, initial encounter: Secondary | ICD-10-CM

## 2013-03-12 DIAGNOSIS — C61 Malignant neoplasm of prostate: Secondary | ICD-10-CM

## 2013-03-12 DIAGNOSIS — R791 Abnormal coagulation profile: Secondary | ICD-10-CM | POA: Diagnosis present

## 2013-03-12 DIAGNOSIS — I5032 Chronic diastolic (congestive) heart failure: Secondary | ICD-10-CM | POA: Diagnosis present

## 2013-03-12 DIAGNOSIS — D649 Anemia, unspecified: Secondary | ICD-10-CM

## 2013-03-12 DIAGNOSIS — I5031 Acute diastolic (congestive) heart failure: Secondary | ICD-10-CM

## 2013-03-12 DIAGNOSIS — I480 Paroxysmal atrial fibrillation: Secondary | ICD-10-CM

## 2013-03-12 DIAGNOSIS — J310 Chronic rhinitis: Secondary | ICD-10-CM

## 2013-03-12 DIAGNOSIS — M949 Disorder of cartilage, unspecified: Secondary | ICD-10-CM

## 2013-03-12 DIAGNOSIS — I4891 Unspecified atrial fibrillation: Secondary | ICD-10-CM

## 2013-03-12 DIAGNOSIS — R410 Disorientation, unspecified: Secondary | ICD-10-CM

## 2013-03-12 DIAGNOSIS — M899 Disorder of bone, unspecified: Secondary | ICD-10-CM

## 2013-03-12 DIAGNOSIS — E785 Hyperlipidemia, unspecified: Secondary | ICD-10-CM

## 2013-03-12 HISTORY — DX: Adverse effect of unspecified anesthetic, initial encounter: T41.45XA

## 2013-03-12 HISTORY — DX: Pleural effusion, not elsewhere classified: J90

## 2013-03-12 HISTORY — DX: Atherosclerotic heart disease of native coronary artery without angina pectoris: I25.10

## 2013-03-12 HISTORY — DX: Other complications of anesthesia, initial encounter: T88.59XA

## 2013-03-12 LAB — URINALYSIS, ROUTINE W REFLEX MICROSCOPIC
Bilirubin Urine: NEGATIVE
Glucose, UA: NEGATIVE mg/dL
Hgb urine dipstick: NEGATIVE
Ketones, ur: NEGATIVE mg/dL
Leukocytes, UA: NEGATIVE
NITRITE: NEGATIVE
PH: 6.5 (ref 5.0–8.0)
Protein, ur: NEGATIVE mg/dL
Specific Gravity, Urine: 1.011 (ref 1.005–1.030)
UROBILINOGEN UA: 1 mg/dL (ref 0.0–1.0)

## 2013-03-12 LAB — BASIC METABOLIC PANEL
BUN: 17 mg/dL (ref 6–23)
CHLORIDE: 97 meq/L (ref 96–112)
CO2: 25 meq/L (ref 19–32)
Calcium: 8.9 mg/dL (ref 8.4–10.5)
Creatinine, Ser: 0.93 mg/dL (ref 0.50–1.35)
GFR calc non Af Amer: 76 mL/min — ABNORMAL LOW (ref >90)
GFR, EST AFRICAN AMERICAN: 88 mL/min — AB (ref >90)
GLUCOSE: 100 mg/dL — AB (ref 70–99)
POTASSIUM: 4.4 meq/L (ref 3.7–5.3)
Sodium: 135 mEq/L — ABNORMAL LOW (ref 137–147)

## 2013-03-12 LAB — CBC WITH DIFFERENTIAL/PLATELET
BASOS PCT: 0 % (ref 0–1)
Basophils Absolute: 0 10*3/uL (ref 0.0–0.1)
Eosinophils Absolute: 0.2 10*3/uL (ref 0.0–0.7)
Eosinophils Relative: 2 % (ref 0–5)
HEMATOCRIT: 31.7 % — AB (ref 39.0–52.0)
HEMOGLOBIN: 10.5 g/dL — AB (ref 13.0–17.0)
LYMPHS ABS: 0.6 10*3/uL — AB (ref 0.7–4.0)
Lymphocytes Relative: 6 % — ABNORMAL LOW (ref 12–46)
MCH: 30.6 pg (ref 26.0–34.0)
MCHC: 33.1 g/dL (ref 30.0–36.0)
MCV: 92.4 fL (ref 78.0–100.0)
MONOS PCT: 8 % (ref 3–12)
Monocytes Absolute: 0.8 10*3/uL (ref 0.1–1.0)
NEUTROS ABS: 8.4 10*3/uL — AB (ref 1.7–7.7)
Neutrophils Relative %: 84 % — ABNORMAL HIGH (ref 43–77)
Platelets: 352 10*3/uL (ref 150–400)
RBC: 3.43 MIL/uL — ABNORMAL LOW (ref 4.22–5.81)
RDW: 13.7 % (ref 11.5–15.5)
WBC: 9.9 10*3/uL (ref 4.0–10.5)

## 2013-03-12 LAB — TROPONIN I: Troponin I: <0.3 ng/mL (ref <0.30)

## 2013-03-12 LAB — PROTIME-INR
INR: 1.69 — ABNORMAL HIGH (ref 0.00–1.49)
Prothrombin Time: 19.4 seconds — ABNORMAL HIGH (ref 11.6–15.2)

## 2013-03-12 LAB — PRO B NATRIURETIC PEPTIDE: Pro B Natriuretic peptide (BNP): 1901 pg/mL — ABNORMAL HIGH (ref 0–450)

## 2013-03-12 LAB — APTT: APTT: 36 s (ref 24–37)

## 2013-03-12 MED ORDER — SODIUM CHLORIDE 0.9 % IJ SOLN: 3.0000 mL | INTRAMUSCULAR | Status: DC | PRN

## 2013-03-12 MED ORDER — LEVOTHYROXINE SODIUM 50 MCG PO TABS
50.0000 ug | ORAL_TABLET | Freq: Every day | ORAL | Status: DC
  Administered 2013-03-13 – 2013-03-14 (×2): 50 ug via ORAL
  Filled 2013-03-12 (×3): qty 1

## 2013-03-12 MED ORDER — ATORVASTATIN CALCIUM 10 MG PO TABS
10.0000 mg | ORAL_TABLET | Freq: Every day | ORAL | Status: DC
  Administered 2013-03-12 – 2013-03-13 (×2): 10 mg via ORAL
  Filled 2013-03-12 (×3): qty 1

## 2013-03-12 MED ORDER — VITAMIN C 500 MG PO TABS
500.0000 mg | ORAL_TABLET | Freq: Every morning | ORAL | Status: DC
  Administered 2013-03-13 – 2013-03-14 (×2): 500 mg via ORAL
  Filled 2013-03-12 (×2): qty 1

## 2013-03-12 MED ORDER — ASPIRIN EC 325 MG PO TBEC
325.0000 mg | DELAYED_RELEASE_TABLET | Freq: Every day | ORAL | Status: DC
  Administered 2013-03-12: 325 mg via ORAL
  Filled 2013-03-12 (×3): qty 1

## 2013-03-12 MED ORDER — OMEGA-3 FATTY ACIDS 1000 MG PO CAPS: 1.0000 g | ORAL_CAPSULE | Freq: Every morning | ORAL | Status: DC

## 2013-03-12 MED ORDER — WARFARIN SODIUM 4 MG PO TABS
4.0000 mg | ORAL_TABLET | Freq: Once | ORAL | Status: DC
  Filled 2013-03-12: qty 1

## 2013-03-12 MED ORDER — FUROSEMIDE 10 MG/ML IJ SOLN
20.0000 mg | Freq: Every day | INTRAMUSCULAR | Status: DC
  Administered 2013-03-12 – 2013-03-13 (×2): 20 mg via INTRAVENOUS
  Filled 2013-03-12 (×3): qty 2

## 2013-03-12 MED ORDER — SODIUM CHLORIDE 0.9 % IV SOLN: 250.0000 mL | INTRAVENOUS | Status: DC | PRN

## 2013-03-12 MED ORDER — ALUM & MAG HYDROXIDE-SIMETH 200-200-20 MG/5ML PO SUSP: 30.0000 mL | Freq: Four times a day (QID) | ORAL | Status: DC | PRN

## 2013-03-12 MED ORDER — VITAMIN D 1000 UNITS PO CAPS
2000.0000 [IU] | ORAL_CAPSULE | Freq: Every morning | ORAL | Status: DC
  Filled 2013-03-12 (×2): qty 2

## 2013-03-12 MED ORDER — SODIUM CHLORIDE 0.9 % IJ SOLN
3.0000 mL | Freq: Two times a day (BID) | INTRAMUSCULAR | Status: DC
  Administered 2013-03-13 – 2013-03-14 (×5): 3 mL via INTRAVENOUS

## 2013-03-12 MED ORDER — ACETAMINOPHEN 650 MG RE SUPP: 650.0000 mg | Freq: Four times a day (QID) | RECTAL | Status: DC | PRN

## 2013-03-12 MED ORDER — ONDANSETRON HCL 4 MG/2ML IJ SOLN: 4.0000 mg | Freq: Four times a day (QID) | INTRAMUSCULAR | Status: DC | PRN

## 2013-03-12 MED ORDER — PIPERACILLIN-TAZOBACTAM 3.375 G IVPB 30 MIN
3.3750 g | Freq: Once | INTRAVENOUS | Status: DC
  Filled 2013-03-12: qty 50

## 2013-03-12 MED ORDER — METOPROLOL TARTRATE 12.5 MG HALF TABLET
12.5000 mg | ORAL_TABLET | Freq: Two times a day (BID) | ORAL | Status: DC
  Administered 2013-03-12 – 2013-03-14 (×5): 12.5 mg via ORAL
  Filled 2013-03-12 (×6): qty 1

## 2013-03-12 MED ORDER — ACETAMINOPHEN 325 MG PO TABS
650.0000 mg | ORAL_TABLET | Freq: Four times a day (QID) | ORAL | Status: DC | PRN
  Administered 2013-03-12: 650 mg via ORAL
  Filled 2013-03-12: qty 2

## 2013-03-12 MED ORDER — VANCOMYCIN HCL 10 G IV SOLR
1500.0000 mg | Freq: Once | INTRAVENOUS | Status: DC
  Filled 2013-03-12: qty 1500

## 2013-03-12 MED ORDER — LORAZEPAM 0.5 MG PO TABS: 0.5000 mg | ORAL_TABLET | Freq: Two times a day (BID) | ORAL | Status: DC | PRN

## 2013-03-12 MED ORDER — IOHEXOL 350 MG/ML SOLN
100.0000 mL | Freq: Once | INTRAVENOUS | Status: AC | PRN
  Administered 2013-03-12: 100 mL via INTRAVENOUS

## 2013-03-12 MED ORDER — OMEGA-3-ACID ETHYL ESTERS 1 G PO CAPS
1.0000 g | ORAL_CAPSULE | Freq: Every morning | ORAL | Status: DC
  Administered 2013-03-13 – 2013-03-14 (×2): 1 g via ORAL
  Filled 2013-03-12 (×2): qty 1

## 2013-03-12 MED ORDER — WARFARIN - PHARMACIST DOSING INPATIENT: Freq: Every day | Status: DC

## 2013-03-12 MED ORDER — SODIUM CHLORIDE 0.9 % IJ SOLN
3.0000 mL | Freq: Two times a day (BID) | INTRAMUSCULAR | Status: DC
  Administered 2013-03-13 – 2013-03-14 (×3): 3 mL via INTRAVENOUS

## 2013-03-12 MED ORDER — ENOXAPARIN SODIUM 40 MG/0.4ML ~~LOC~~ SOLN
40.0000 mg | SUBCUTANEOUS | Status: DC
  Administered 2013-03-12: 40 mg via SUBCUTANEOUS
  Filled 2013-03-12 (×2): qty 0.4

## 2013-03-12 MED ORDER — DOCUSATE SODIUM 100 MG PO CAPS
100.0000 mg | ORAL_CAPSULE | Freq: Two times a day (BID) | ORAL | Status: DC
Start: 2013-03-12 — End: 2013-03-14
  Administered 2013-03-12: 100 mg via ORAL
  Filled 2013-03-12 (×6): qty 1

## 2013-03-12 MED ORDER — ONDANSETRON HCL 4 MG PO TABS: 4.0000 mg | ORAL_TABLET | Freq: Four times a day (QID) | ORAL | Status: DC | PRN

## 2013-03-12 MED ORDER — HALOPERIDOL LACTATE 5 MG/ML IJ SOLN
1.0000 mg | Freq: Four times a day (QID) | INTRAMUSCULAR | Status: DC | PRN
  Filled 2013-03-12: qty 0.2

## 2013-03-12 NOTE — ED Notes (Signed)
1 Blood Culture drawn.

## 2013-03-12 NOTE — ED Notes (Signed)
Pt given hot packs for feet.

## 2013-03-12 NOTE — ED Notes (Signed)
Pt here for near syncope epsidoe while walking to restroom, pt had open heart surgery 2 weeks ago and released on Tuesday, pt reports sob with exertion.

## 2013-03-12 NOTE — Progress Notes (Signed)
ANTICOAGULATION CONSULT NOTE - Initial Consult  Pharmacy Consult for Coumadin Indication: afib, MV repair  No Known Allergies  Patient Measurements: Height: 5\' 9"  (175.3 cm) Weight: 189 lb 13.1 oz (86.1 kg) IBW/kg (Calculated) : 70.7 Heparin Dosing Weight: n/a  Vital Signs: Temp: 98.3 F (36.8 C) (01/19 1830) Temp src: Oral (01/19 1830) BP: 138/63 mmHg (01/19 1830) Pulse Rate: 129 (01/19 1830)  Labs:  Recent Labs  03/12/13 0945 03/12/13 1222  HGB 10.5*  --   HCT 31.7*  --   PLT 352  --   APTT  --  36  LABPROT  --  19.4*  INR  --  1.69*  CREATININE 0.93  --   TROPONINI <0.30  --     Estimated Creatinine Clearance: 65.5 ml/min (by C-G formula based on Cr of 0.93).   Medical History: Past Medical History  Diagnosis Date  . Personal history of colonic polyps     tubular adenoma  . Hypertension   . Irritable bowel syndrome   . Prostate cancer 1991    sees Dr. Carlota Raspberry at [facility], observation only   . Atrial fibrillation   . S/P mitral valve repair, maze procedure, and CABG x2 02/28/2013    Complex valvuloplasty including triangular resection of posterior leaflet, 30 mm Sorin Memo 3D ring annuloplasty  . S/P CABG x 2 02/28/2013    LIMA to LAD, SVG to OM1, EVH via right thigh  . S/P Maze operation for atrial fibrillation 02/28/2013    Complete bilateral atrial lesion set using bipolar radiofrequency and cryothermy ablation with clipping of LA appendage  . Pleural effusion, bilateral 03/12/2013    Small to moderate, L>R    Medications:  Scheduled:  . aspirin EC  325 mg Oral Daily  . atorvastatin  10 mg Oral q1800  . docusate sodium  100 mg Oral BID  . enoxaparin (LOVENOX) injection  40 mg Subcutaneous Q24H  . [START ON 03/13/2013] fish oil-omega-3 fatty acids  1 g Oral q morning - 10a  . furosemide  20 mg Intravenous Daily  . levothyroxine  50 mcg Oral Daily  . metoprolol tartrate  12.5 mg Oral BID  . sodium chloride  3 mL Intravenous Q12H  . sodium chloride  3 mL  Intravenous Q12H  . [START ON 03/13/2013] vitamin C  500 mg Oral q morning - 10a  . [START ON 03/13/2013] Vitamin D  2,000 Units Oral q morning - 10a    Assessment: 80 yo male on chronic Coumadin for afib and MV repair admitted with failure to thrive, SOB, and mild congestion from CHF.  S/p CABG x 2, mitral valve repair and MAZE 02/28/13.  INR subtherapeutic today at 1.69.  Per admission med rec, last dose of Coumadin was last night.  PTA Coumadin dose 2.5 mg daily.    Goal of Therapy:  INR 2-3 Monitor platelets by anticoagulation protocol: Yes   Plan:  1. Coumadin 4 mg x 1 tonight. 2. Daily PT/INR.  Uvaldo Rising, BCPS  Clinical Pharmacist Pager [phone]  03/12/2013 6:48 PM

## 2013-03-12 NOTE — Consult Note (Addendum)
Reason for Consult: Near syncope  Requesting Physician: [facility]  HPI: This is a 80 y.o. male with a past medical history significant for CAD, PAF, MV disease. He had been seen by Dr Harrington Challenger in the past for PAF. He had declined Coumadin Rx then. He was admitted to [facility] 02/25/13 with chest pain and SOB. The patient was transfered to [facility] on 02/25/2013. Cardiac catheterization was performed on 02/26/2013, and revealed severe 3 vessel coronary artery disease with preserved left ventricular function. He had mild MR by TTE. A cardiac surgery consult was requested for consideration of surgical revascularization. Dr. Roxy Manns saw the patient and reviewed his films. He felt that the patient would benefit from CABG as well as Maze procedure. On 02/28/13 he had CABG x 2 LIMA-LAD and an SVG-OM.  Intra OP transesophageal echocardiogram revealed significant mitral valve prolapse with moderate to severe mitral regurgitation, therefore, a mitral valve repair was also performed as well as a Maze procedure. The postoperative course was complicated by PAF. He was placed on Amiodarone and Coumadin before discharge.  He was discharged 03/06/13 to home with his family.          Since discharge he has been a little slow to progress. He has had some dyspnea and at night has been confused at times. Today he was sitting on the commode when he became dizzy and called out for help. In the ER his BNP is 1901. His CXR suggested vascular congestion. He is not in CHF on exam. His B/P is in the 90's. His telemetry shows AF with CVR.     PMHx:  Past Medical History  Diagnosis Date  . Personal history of colonic polyps     tubular adenoma  . Hypertension   . Irritable bowel syndrome   . Prostate cancer 1991    sees Dr. Carlota Raspberry at [place], observation only   . Atrial fibrillation   . S/P mitral valve repair, maze procedure, and CABG x2 02/28/2013    Complex valvuloplasty including triangular resection of posterior leaflet, 30 mm Sorin  Memo 3D ring annuloplasty  . S/P CABG x 2 02/28/2013    LIMA to LAD, SVG to OM1, EVH via right thigh  . S/P Maze operation for atrial fibrillation 02/28/2013    Complete bilateral atrial lesion set using bipolar radiofrequency and cryothermy ablation with clipping of LA appendage  . Pleural effusion, bilateral 03/12/2013    Small to moderate, L>R   Past Surgical History  Procedure Laterality Date  . Hemorrhoid surgery    . Prostate biopsy    . Colonoscopy  2011    per Dr. Sharlett Iles, benign polyps, no repeats needed   . Cataract extraction, bilateral  2014    per Dr. Gershon Crane   . Coronary artery bypass graft N/A 02/28/2013    Procedure: CORONARY ARTERY BYPASS GRAFTING (CABG);  Surgeon: Rexene Alberts, MD;  Location: [place];  Service: Open Heart Surgery;  Laterality: N/A;  CABG x 2,  using left internal mammary artery and right leg saphenous vein harvested endoscopically  . Intraoperative transesophageal echocardiogram N/A 02/28/2013    Procedure: INTRAOPERATIVE TRANSESOPHAGEAL ECHOCARDIOGRAM;  Surgeon: Rexene Alberts, MD;  Location: [place];  Service: Open Heart Surgery;  Laterality: N/A;  . Maze N/A 02/28/2013    Procedure: MAZE;  Surgeon: Rexene Alberts, MD;  Location: [place];  Service: Open Heart Surgery;  Laterality: N/A;  . Mitral valve repair N/A 02/28/2013    Procedure: MITRAL VALVE REPAIR (MVR);  Surgeon: Rexene Alberts, MD;  Location: [place];  Service: Open Heart Surgery;  Laterality: N/A;    FAMHx: Diabetes, HTN, ling cancer   SOCHx:  reports that he has never smoked. He has never used smokeless tobacco. He reports that he does not drink alcohol or use illicit drugs.  ALLERGIES: No Known Allergies  ROS: Pertinent items are noted in HPI. See H&P for complete details.  HOME MEDICATIONS:  (Not in a hospital admission)  HOSPITAL MEDICATIONS: I have reviewed the patient's current medications.  VITALS: Blood pressure 147/63, pulse 85, temperature 99 F (37.2 C), temperature source  Oral, resp. rate 18, SpO2 100.00%.  PHYSICAL EXAM: General appearance: alert, cooperative, no distress and pale Neck: no carotid bruit and no JVD Lungs: decreased breath sounds at bases, Lt > Rt Heart: irregularly irregular rhythm and no MR or rub heard Abdomen: soft, non-tender; bowel sounds normal; no masses,  no organomegaly Extremities: 1+ ankle edema bilat Pulses: dimminished Skin: Pale, cool, dry Neurologic: Grossly normal  LABS: Results for orders placed during the hospital encounter of 03/12/13 (from the past 48 hour(s))  CBC WITH DIFFERENTIAL     Status: Abnormal   Collection Time    03/12/13  9:45 AM      Result Value Range   WBC 9.9  4.0 - 10.5 K/uL   RBC 3.43 (*) 4.22 - 5.81 MIL/uL   Hemoglobin 10.5 (*) 13.0 - 17.0 g/dL   HCT 31.7 (*) 39.0 - 52.0 %   MCV 92.4  78.0 - 100.0 fL   MCH 30.6  26.0 - 34.0 pg   MCHC 33.1  30.0 - 36.0 g/dL   RDW 13.7  11.5 - 15.5 %   Platelets 352  150 - 400 K/uL   Neutrophils Relative % 84 (*) 43 - 77 %   Neutro Abs 8.4 (*) 1.7 - 7.7 K/uL   Lymphocytes Relative 6 (*) 12 - 46 %   Lymphs Abs 0.6 (*) 0.7 - 4.0 K/uL   Monocytes Relative 8  3 - 12 %   Monocytes Absolute 0.8  0.1 - 1.0 K/uL   Eosinophils Relative 2  0 - 5 %   Eosinophils Absolute 0.2  0.0 - 0.7 K/uL   Basophils Relative 0  0 - 1 %   Basophils Absolute 0.0  0.0 - 0.1 K/uL  BASIC METABOLIC PANEL     Status: Abnormal   Collection Time    03/12/13  9:45 AM      Result Value Range   Sodium 135 (*) 137 - 147 mEq/L   Potassium 4.4  3.7 - 5.3 mEq/L   Chloride 97  96 - 112 mEq/L   CO2 25  19 - 32 mEq/L   Glucose, Bld 100 (*) 70 - 99 mg/dL   BUN 17  6 - 23 mg/dL   Creatinine, Ser 0.93  0.50 - 1.35 mg/dL   Calcium 8.9  8.4 - 10.5 mg/dL   GFR calc non Af Amer 76 (*) >90 mL/min   GFR calc Af Amer 88 (*) >90 mL/min   Comment: (NOTE)     The eGFR has been calculated using the CKD EPI equation.     This calculation has not been validated in all clinical situations.     eGFR's  persistently <90 mL/min signify possible Chronic Kidney     Disease.  TROPONIN I     Status: None   Collection Time    03/12/13  9:45 AM  Result Value Range   Troponin I <0.30  <0.30 ng/mL   Comment:            Due to the release kinetics of cTnI,     a negative result within the first hours     of the onset of symptoms does not rule out     myocardial infarction with certainty.     If myocardial infarction is still suspected,     repeat the test at appropriate intervals.  URINALYSIS, ROUTINE W REFLEX MICROSCOPIC     Status: None   Collection Time    03/12/13 10:34 AM      Result Value Range   Color, Urine YELLOW  YELLOW   APPearance CLEAR  CLEAR   Specific Gravity, Urine 1.011  1.005 - 1.030   pH 6.5  5.0 - 8.0   Glucose, UA NEGATIVE  NEGATIVE mg/dL   Hgb urine dipstick NEGATIVE  NEGATIVE   Bilirubin Urine NEGATIVE  NEGATIVE   Ketones, ur NEGATIVE  NEGATIVE mg/dL   Protein, ur NEGATIVE  NEGATIVE mg/dL   Urobilinogen, UA 1.0  0.0 - 1.0 mg/dL   Nitrite NEGATIVE  NEGATIVE   Leukocytes, UA NEGATIVE  NEGATIVE   Comment: MICROSCOPIC NOT DONE ON URINES WITH NEGATIVE PROTEIN, BLOOD, LEUKOCYTES, NITRITE, OR GLUCOSE <1000 mg/dL.  PROTIME-INR     Status: Abnormal   Collection Time    03/12/13 12:22 PM      Result Value Range   Prothrombin Time 19.4 (*) 11.6 - 15.2 seconds   INR 1.69 (*) 0.00 - 1.49  APTT     Status: None   Collection Time    03/12/13 12:22 PM      Result Value Range   aPTT 36  24 - 37 seconds  PRO B NATRIURETIC PEPTIDE     Status: Abnormal   Collection Time    03/12/13 12:22 PM      Result Value Range   Pro B Natriuretic peptide (BNP) 1901.0 (*) 0 - 450 pg/mL    EKG:   IMAGING: Ct Angio Chest Pe W/cm &/or Wo Cm  03/12/2013   CLINICAL DATA:  Near syncope. Shortness of breath. Chest pain. Clinical suspicion for pulmonary embolism.  EXAM: CT ANGIOGRAPHY CHEST WITH CONTRAST  TECHNIQUE: Multidetector CT imaging of the chest was performed using the standard  protocol during bolus administration of intravenous contrast. Multiplanar CT image reconstructions including MIPs were obtained to evaluate the vascular anatomy.  CONTRAST:  188m OMNIPAQUE IOHEXOL 350 MG/ML SOLN  COMPARISON:  Chest radiograph on 03/12/13  FINDINGS: Satisfactory opacification of pulmonary arteries noted, and no pulmonary emboli identified. No evidence of thoracic aortic dissection or aneurysm. No evidence of mediastinal hematoma or mass. A large hiatal hernia is seen. No evidence of lymphadenopathy.  Moderate size bilateral pleural effusions are seen with compressive atelectasis in both lower lobes. Tiny pericardial effusion also noted. Diffuse interstitial thickening and ground-glass pulmonary opacity seen bilaterally, suspicious for mild pulmonary edema.  Review of the MIP images confirms the above findings.  IMPRESSION: No evidence of pulmonary embolism.  Findings consistent with mild congestive heart failure. Moderate bilateral pleural effusions and lower lobe compressive atelectasis.  Large hiatal hernia.   Electronically Signed   By: JEarle GellM.D.   On: 03/12/2013 12:27   Dg Chest Port 1 View  03/12/2013   CLINICAL DATA:  Syncope. Shortness of breath. Decreased breath sounds in left lung base.  EXAM: PORTABLE CHEST - 1 VIEW  COMPARISON:  03/03/2013  FINDINGS: Cardiomegaly and prior CABG again noted. Pulmonary vascular congestion again demonstrated, however there is no evidence of frank pulmonary edema. There is increased opacity in the medial left retrocardiac lung base, and left lower lobe pneumonia cannot be excluded.  IMPRESSION: Increased opacity in medial left retrocardiac lung base. Cannot exclude left lower lobe pneumonia. Follow-up with two view chest radiograph recommended.  Stable cardiomegaly and pulmonary vascular congestion.   Electronically Signed   By: Earle Gell M.D.   On: 03/12/2013 10:31    IMPRESSION: Principal Problem:   Pre-syncope Active Problems:   S/P  mitral valve repair, maze procedure, and CABG x2 02/29/12   PAF- pre and post op, now on Amiodarone   CAD - LIMA-LAD, SVG-OM Feb 29 2012   Atrial fibrillation- back in AF with CVR   Hypotension, unspecified   Pleural effusion, bilateral   ADENOCARCINOMA, PROSTATE, GLEASON GRADE 3   HYPOTHYROIDISM   HYPERLIPIDEMIA   HYPERTENSION   CHF (congestive heart failure)   Anemia- post op   Subtherapeutic international normalized ratio (INR)   RECOMMENDATION: Will follow, hold ACE for now. He may still be volume overloaded but would avoid aggressive diuresis. His DC wgt was 189, and his admission wgt was 190.   Time Spent Directly with Patient: 35 minutes  Erlene Quan [phone] beeper 03/12/2013, 3:08 PM   Patient seen and examined personally. Agree with above.  80 year old male with recent coronary artery bypass/maze/mitral valve repair with acute delirium worsened at night. Recent episode of increased dizziness.  1. Acute delirium-agree with observation. Exclude infectious etiology postoperatively. Electrolytes appear normal. Sodium within normal limits. Question possible medication, therefore amiodarone is currently on hold but my suspicion for amiodarone as the cause for his delirium is quite low and in the setting of his recent Maze procedure and sinus rhythm upon discharge, I would like to continue his amiodarone.  2. Atrial fibrillation-currently heart rate is in the 110-120 range. Status post Maze procedure. I would like to continue his amiodarone. It will be challenging to provide rate control for him if his blood pressure remains low. Most recently, 147/63. On admission 97/68. INR is currently subtherapeutic at 1.69. We will continue with low-dose metoprolol tartrate 12.5 mg twice a day. Try to further uptitrate if possible.  3. Chronic anticoagulation-warfarin per pharmacy. Currently subtherapeutic.  4. Postoperative anemia.  5. Near syncope/hypotension-currently improved. No  evidence of pulmonary embolism. Moderate bilateral pleural effusions noted with lower lobe compressive atelectasis bilaterally. Reluctant currently to administer diuresis as he was recently hypotensive. Clinically, currently not short of breath. Discharge weight very comparable to admit weight. Holding ACE inhibitor.  We will follow with you. I do also believe that taking care of him at home is very challenging for his family. May need skilled nursing facility.

## 2013-03-12 NOTE — ED Provider Notes (Signed)
CSN: [id number]     Arrival date & time 03/12/13  0855 History   First MD Initiated Contact with Patient 03/12/13 [phone]     Chief Complaint  Patient presents with  . Near Syncope    HPI: [name] is an 80 yo M with history of HTN, atrial fibrillation, CAD s/p CABG and mitral valve repair 12 days ago, who presents with near syncope. He had a bowel movement this morning, he did have to strain. He got up and was walking to his bedroom, and felt as though he needed to have another bowel movement. He sat back down on the toilet. When he stood up the second time, he felt lightheaded and his legs became weak. He started to fall but his son caught him before he hit the floor. They assisted him to a chair and called EMS. He did not have LOC. He denies CP, diarrhea, vomiting or nausea. He did endorse mild SOB when straining to have a BM but denies this now. His family is concerned about night-time agitation and hallucinations he has had since taking Tramadol for pain. He has intermittently had leg swelling which has improved now. He has been eating and drinking fluids. No urinary symptoms. No exertional chest pain or SOB.    Past Medical History  Diagnosis Date  . Personal history of colonic polyps     tubular adenoma  . Hypertension   . Irritable bowel syndrome   . Prostate cancer 1991    sees Dr. Carlota Raspberry at [place], observation only   . Atrial fibrillation   . S/P mitral valve repair, maze procedure, and CABG x2 02/28/2013    Complex valvuloplasty including triangular resection of posterior leaflet, 30 mm Sorin Memo 3D ring annuloplasty  . S/P CABG x 2 02/28/2013    LIMA to LAD, SVG to OM1, EVH via right thigh  . S/P Maze operation for atrial fibrillation 02/28/2013    Complete bilateral atrial lesion set using bipolar radiofrequency and cryothermy ablation with clipping of LA appendage   Past Surgical History  Procedure Laterality Date  . Hemorrhoid surgery    . Prostate biopsy    . Colonoscopy  2011     per Dr. Sharlett Iles, benign polyps, no repeats needed   . Cataract extraction, bilateral  2014    per Dr. Gershon Crane   . Coronary artery bypass graft N/A 02/28/2013    Procedure: CORONARY ARTERY BYPASS GRAFTING (CABG);  Surgeon: Rexene Alberts, MD;  Location: [place];  Service: Open Heart Surgery;  Laterality: N/A;  CABG x 2,  using left internal mammary artery and right leg saphenous vein harvested endoscopically  . Intraoperative transesophageal echocardiogram N/A 02/28/2013    Procedure: INTRAOPERATIVE TRANSESOPHAGEAL ECHOCARDIOGRAM;  Surgeon: Rexene Alberts, MD;  Location: [place];  Service: Open Heart Surgery;  Laterality: N/A;  . Maze N/A 02/28/2013    Procedure: MAZE;  Surgeon: Rexene Alberts, MD;  Location: [place];  Service: Open Heart Surgery;  Laterality: N/A;  . Mitral valve repair N/A 02/28/2013    Procedure: MITRAL VALVE REPAIR (MVR);  Surgeon: Rexene Alberts, MD;  Location: [place];  Service: Open Heart Surgery;  Laterality: N/A;   Family History  Problem Relation Age of Onset  . Diabetes    . Hypertension    . Lung cancer Brother   . Sudden death    . Heart disease    . Stroke    . Heart disease Son   . Stomach cancer Brother  History  Substance Use Topics  . Smoking status: Never Smoker   . Smokeless tobacco: Never Used  . Alcohol Use: No    Review of Systems  Constitutional: Positive for appetite change (decreased) and fatigue. Negative for fever and chills.  Eyes: Negative for photophobia and visual disturbance.  Respiratory: Positive for shortness of breath (during BM). Negative for cough.   Cardiovascular: Negative for chest pain and leg swelling.  Gastrointestinal: Negative for nausea, vomiting, abdominal pain, diarrhea and constipation.  Genitourinary: Negative for dysuria, frequency and decreased urine volume.  Musculoskeletal: Negative for arthralgias, back pain, gait problem and myalgias.  Skin: Positive for wound (well healing surgical incisions). Negative for  color change.  Neurological: Positive for syncope and light-headedness. Negative for dizziness and headaches.  Psychiatric/Behavioral: Negative for confusion and agitation.  All other systems reviewed and are negative.    Allergies  Review of patient's allergies indicates no known allergies.  Home Medications   Current Outpatient Rx  Name  Route  Sig  Dispense  Refill  . amiodarone (PACERONE) 200 MG tablet   Oral   Take 1 tablet (200 mg total) by mouth 2 (two) times daily after a meal. For 1 week, then decrease to 200 mg daily   60 tablet   1   . aspirin 325 MG tablet   Oral   Take 325 mg by mouth daily.          [name] atorvastatin (LIPITOR) 10 MG tablet   Oral   Take 10 mg by mouth daily. At 6pm         . Cholecalciferol (VITAMIN D) 1000 UNITS capsule   Oral   Take 2,000 Units by mouth every morning.          . fish oil-omega-3 fatty acids 1000 MG capsule   Oral   Take 1 g by mouth every morning.          [name] levothyroxine (SYNTHROID, LEVOTHROID) 50 MCG tablet   Oral   Take 1 tablet (50 mcg total) by mouth daily.   90 tablet   3   . lisinopril (PRINIVIL,ZESTRIL) 2.5 MG tablet   Oral   Take 1 tablet (2.5 mg total) by mouth daily.   30 tablet   3   . metoprolol tartrate (LOPRESSOR) 25 MG tablet   Oral   Take 12.5 mg by mouth 2 (two) times daily.         . traMADol (ULTRAM) 50 MG tablet   Oral   Take 1 tablet (50 mg total) by mouth every 6 (six) hours as needed for moderate pain.   30 tablet   3   . vitamin C (ASCORBIC ACID) 500 MG tablet   Oral   Take 500 mg by mouth every morning.          . warfarin (COUMADIN) 2.5 MG tablet   Oral   Take 1 tablet (2.5 mg total) by mouth daily at 6 PM.   30 tablet   3     Dispense as written.    BP 97/68  Pulse 85  Temp(Src) 98.4 F (36.9 C) (Oral)  Resp 16  SpO2 96% Physical Exam  Nursing note and vitals reviewed. Constitutional: He is oriented to person, place, and time. No distress.  Elderly  male, laying in bed, appears comfortable.   HENT:  Head: Normocephalic and atraumatic.  Mouth/Throat: Oropharynx is clear and moist.  Eyes: Conjunctivae and EOM are normal. Pupils are equal, round, and reactive to  light.  Neck: Normal range of motion. Neck supple. JVD present.  Cardiovascular: Normal rate, normal heart sounds and intact distal pulses.  An irregularly irregular rhythm present.  Pulmonary/Chest: Effort normal. No respiratory distress. He has decreased breath sounds (in bilateral bases).  Midline surgical scar, well healing, no significant surrounding redness or drainage.   Upper abdominal incisions well healing.   Abdominal: Soft. Bowel sounds are normal. There is no tenderness. There is no rebound and no guarding.  Musculoskeletal: Normal range of motion. He exhibits edema (1+ LE edema, symmetric. ). He exhibits no tenderness.  Neurological: He is alert and oriented to person, place, and time. No cranial nerve deficit. Coordination normal.  Skin: Skin is warm and dry. No rash noted.  Psychiatric: He has a normal mood and affect. His behavior is normal.    ED Course  Procedures (including critical care time) Labs Review Labs Reviewed  CBC WITH DIFFERENTIAL - Abnormal; Notable for the following:    RBC 3.43 (*)    Hemoglobin 10.5 (*)    HCT 31.7 (*)    Neutrophils Relative % 84 (*)    Neutro Abs 8.4 (*)    Lymphocytes Relative 6 (*)    Lymphs Abs 0.6 (*)    All other components within normal limits  BASIC METABOLIC PANEL - Abnormal; Notable for the following:    Sodium 135 (*)    Glucose, Bld 100 (*)    GFR calc non Af Amer 76 (*)    GFR calc Af Amer 88 (*)    All other components within normal limits  PROTIME-INR - Abnormal; Notable for the following:    Prothrombin Time 19.4 (*)    INR 1.69 (*)    All other components within normal limits  PRO B NATRIURETIC PEPTIDE - Abnormal; Notable for the following:    Pro B Natriuretic peptide (BNP) 1901.0 (*)    All  other components within normal limits  CULTURE, BLOOD (ROUTINE X 2)  CULTURE, BLOOD (ROUTINE X 2)  TROPONIN I  URINALYSIS, ROUTINE W REFLEX MICROSCOPIC  APTT   Imaging Review Dg Chest Port 1 View  03/12/2013   CLINICAL DATA:  Syncope. Shortness of breath. Decreased breath sounds in left lung base.  EXAM: PORTABLE CHEST - 1 VIEW  COMPARISON:  03/03/2013  FINDINGS: Cardiomegaly and prior CABG again noted. Pulmonary vascular congestion again demonstrated, however there is no evidence of frank pulmonary edema. There is increased opacity in the medial left retrocardiac lung base, and left lower lobe pneumonia cannot be excluded.  IMPRESSION: Increased opacity in medial left retrocardiac lung base. Cannot exclude left lower lobe pneumonia. Follow-up with two view chest radiograph recommended.  Stable cardiomegaly and pulmonary vascular congestion.   Electronically Signed   By: Earle Gell M.D.   On: 03/12/2013 10:31    EKG Interpretation    Date/Time:  Monday March 12 2013 09:11:21 EST Ventricular Rate:  77 PR Interval:    QRS Duration: 95 QT Interval:  385 QTC Calculation: 436 R Axis:   -29 Text Interpretation:  Atrial fibrillation Borderline left axis deviation Low voltage, precordial leads Anteroseptal infarct, old Confirmed by Mcihael Hinderman  MD, ANTHONY ([id number]) on 03/12/2013 10:43:47 AM            MDM  80 yo M with history of HTN, atrial fibrillation, CAD s/p CABG and mitral valve repair 12 days ago, who presents with near syncope. ECG without arrythmia, prolonged QTc or ischemic changes. O2 sats 95% on RA. Otherwise HD  stable. Initial CXR shows questionable L sided opacity concerning for PNA. Given mild hypoxia, syncope and recent surgery, concern for PE. Obtained CTA which shows pulmonary edema and bilateral pleural effusions but no PE. BNP elevated to 1900 making CHF likely. Hgb 10.5, WBC 9.9, normal lytes and cr. UA negative. Troponin undetectable, low suspicion for ACS. CT did not show  PNA, so abx not given. INR 1.6.  Given evidence of CHF in the gentleman who recently underwent CABG, will admit for further management. CT surgery evaluated in the ED as well. The patient and his family were updated on w/u and need for admission, they were in agreement with plan.   Reviewed imaging, labs and previous medical records, utilized in MDM  Discussed case with Dr. Zenia Resides.   Clinical Impresion 1. Near syncope 2. Pulmonary edema 3. H/o recent CABG   Louretta Shorten, MD 03/12/13 1734

## 2013-03-12 NOTE — ED Provider Notes (Signed)
I saw and evaluated the patient, reviewed the resident's note and I agree with the findings and plan.  EKG Interpretation    Date/Time:  Monday March 12 2013 09:11:21 EST Ventricular Rate:  77 PR Interval:    QRS Duration: 95 QT Interval:  385 QTC Calculation: 436 R Axis:   -29 Text Interpretation:  Atrial fibrillation Borderline left axis deviation Low voltage, precordial leads Anteroseptal infarct, old Confirmed by Gerald ([id number]) on 03/12/2013 10:43:47 AM           Patient here with a near syncopal event associated with shortness of breath and cough. Chest x-ray shows early pneumonia, left side. Will place patient on vancomycin and Zosyn for HCAP. Patient to be admitted to the hospitalist  Leota Jacobsen, MD 03/12/13 1056

## 2013-03-12 NOTE — ED Notes (Signed)
Pt's antibiotics held per Dr Zenia Resides.

## 2013-03-12 NOTE — H&P (Addendum)
[facility]                                                                                    Patient Demographics  [name], is a 80 y.o. male  MRN: [id number]   DOB - [birth year]  Admit Date - 03/12/2013  Outpatient Primary MD for the patient is [name], MD   With History of -  Past Medical History  Diagnosis Date  . Personal history of colonic polyps     tubular adenoma  . Hypertension   . Irritable bowel syndrome   . Prostate cancer 1991    sees Dr. Carlota Raspberry at [facility], observation only   . Atrial fibrillation   . S/P mitral valve repair, maze procedure, and CABG x2 02/28/2013    Complex valvuloplasty including triangular resection of posterior leaflet, 30 mm Sorin Memo 3D ring annuloplasty  . S/P CABG x 2 02/28/2013    LIMA to LAD, SVG to OM1, EVH via right thigh  . S/P Maze operation for atrial fibrillation 02/28/2013    Complete bilateral atrial lesion set using bipolar radiofrequency and cryothermy ablation with clipping of LA appendage  . Pleural effusion, bilateral 03/12/2013    Small to moderate, L>R      Past Surgical History  Procedure Laterality Date  . Hemorrhoid surgery    . Prostate biopsy    . Colonoscopy  2011    per Dr. Sharlett Iles, benign polyps, no repeats needed   . Cataract extraction, bilateral  2014    per Dr. Gershon Crane   . Coronary artery bypass graft N/A 02/28/2013    Procedure: CORONARY ARTERY BYPASS GRAFTING (CABG);  Surgeon: Rexene Alberts, MD;  Location: [place];  Service: Open Heart Surgery;  Laterality: N/A;  CABG x 2,  using left internal mammary artery and right leg saphenous vein harvested endoscopically  . Intraoperative transesophageal echocardiogram N/A 02/28/2013    Procedure: INTRAOPERATIVE TRANSESOPHAGEAL ECHOCARDIOGRAM;  Surgeon: Rexene Alberts, MD;  Location: [place];  Service: Open Heart Surgery;  Laterality: N/A;  . Maze N/A 02/28/2013    Procedure: MAZE;  Surgeon: Rexene Alberts, MD;  Location: [place];  Service: Open Heart  Surgery;  Laterality: N/A;  . Mitral valve repair N/A 02/28/2013    Procedure: MITRAL VALVE REPAIR (MVR);  Surgeon: Rexene Alberts, MD;  Location: [place];  Service: Open Heart Surgery;  Laterality: N/A;    in for   Chief Complaint  Patient presents with  . Near Syncope     HPI  [name]  is a 80 y.o. male, with a PMH of Mitral value repair, CABG, and Maze procedure on 01.07.15, a fib, HTN, IBS, and prostate CA. He is presenting today to the ED with pre syncope that occurred this morning while having a BM. Pt stated that he had SOB during the episode. Pt did not fall or hit head or lose consciousness during the episode. Pt and his family also admit to pt having visual hallucinations and progressive weakness which began after d/c from hospital after recent surgery. That patient realizes that his hallucinations are not real, yet he sees bugs on the wall.  This  morning in the ED he saw old movie stars lined up in the hallway.  Family states that pt has a lot of agitation at night with inability to stay still and continued use of his arms despite surgeon's orders. Pt's family would like him to go to SNF for rehab upon d/c from this admission.   In the emergency department his BNP is elevated at 1901, and CT chest shows early CHF.  We will admit him for acute delirium and CHF.    Review of Systems    In addition to the HPI above,  ++ for progressively worsening SOB and weakness since discharge from the hospital on 1/12. No Fever-chills, No Headache, No changes with Vision or hearing, No Chest pain No Abdominal pain, No Nausea or Vomiting, Bowel movements are regular   A full 10 point Review of Systems was done, except as stated above, all other Review of Systems were negative.  Social History History  Substance Use Topics  . Smoking status: Never Smoker   . Smokeless tobacco: Never Used  . Alcohol Use: No  Cared for my his wife, daughter and grand son at home.  Both the wife and  daughter are in a wheel chair.  Family History Family History  Problem Relation Age of Onset  . Diabetes    . Hypertension    . Lung cancer Brother   . Sudden death    . Heart disease    . Stroke    . Heart disease Son   . Stomach cancer Brother      Prior to Admission medications   Medication Sig Start Date End Date Taking? Authorizing Provider  amiodarone (PACERONE) 200 MG tablet Take 1 tablet (200 mg total) by mouth 2 (two) times daily after a meal. For 1 week, then decrease to 200 mg daily 03/06/13  Yes Erin Barrett, PA-C  aspirin 325 MG tablet Take 325 mg by mouth daily.    Yes Historical Provider, MD  atorvastatin (LIPITOR) 10 MG tablet Take 10 mg by mouth daily. At 6pm   Yes Historical Provider, MD  Cholecalciferol (VITAMIN D) 1000 UNITS capsule Take 2,000 Units by mouth every morning.    Yes Historical Provider, MD  fish oil-omega-3 fatty acids 1000 MG capsule Take 1 g by mouth every morning.    Yes Historical Provider, MD  levothyroxine (SYNTHROID, LEVOTHROID) 50 MCG tablet Take 1 tablet (50 mcg total) by mouth daily. 09/25/12  Yes [name], MD  lisinopril (PRINIVIL,ZESTRIL) 2.5 MG tablet Take 1 tablet (2.5 mg total) by mouth daily. 03/06/13  Yes Erin Barrett, PA-C  metoprolol tartrate (LOPRESSOR) 25 MG tablet Take 12.5 mg by mouth 2 (two) times daily.   Yes Historical Provider, MD  traMADol (ULTRAM) 50 MG tablet Take 1 tablet (50 mg total) by mouth every 6 (six) hours as needed for moderate pain. 03/06/13  Yes Erin Barrett, PA-C  vitamin C (ASCORBIC ACID) 500 MG tablet Take 500 mg by mouth every morning.    Yes Historical Provider, MD  warfarin (COUMADIN) 2.5 MG tablet Take 1 tablet (2.5 mg total) by mouth daily at 6 PM. 03/06/13  Yes Erin Barrett, PA-C    No Known Allergies  Physical Exam  Vitals  Blood pressure 97/68, pulse 85, temperature 98.4 F (36.9 C), temperature source Oral, resp. rate 16, SpO2 96.00%.   General:  lying in bed, NAD, very pleasant, but upset  about hallucinations.   Psych:  Awake Alert, Oriented X 3.  Neuro:  No F.N deficits, Sensation intact all 4 extremities.  ENT:  Ears and Eyes appear Normal, Conjunctivae clear, PERRLA. Moist Oral Mucosa.  Neck:  Supple Neck, No JVD, No cervical lymphadenopathy appreciated.  Respiratory:  Symmetrical Chest wall movement, Good air movement bilaterally, CTAB.  Cardiac:  irreg irreg,  No Gallops, Rubs or Murmurs, No Parasternal Heave.  Abdomen:  Positive Bowel Sounds, Abdomen Soft, Non tender, No organomegaly appriciated  Skin:  No Cyanosis, Normal Skin Turgor, No Skin Rash or Bruise. Ecchymosis on arms B/L.   Extremities:  1+ pitting edema in lower legs B/L, Normal ROM.   Data Review  CBC  Recent Labs Lab 03/12/13 0945  WBC 9.9  HGB 10.5*  HCT 31.7*  PLT 352  MCV 92.4  MCH 30.6  MCHC 33.1  RDW 13.7  LYMPHSABS 0.6*  MONOABS 0.8  EOSABS 0.2  BASOSABS 0.0   ------------------------------------------------------------------------------------------------------------------  Chemistries   Recent Labs Lab 03/12/13 0945  NA 135*  K 4.4  CL 97  CO2 25  GLUCOSE 100*  BUN 17  CREATININE 0.93  CALCIUM 8.9   --------------------------------------------------------------------------------------  Coagulation profile  Recent Labs Lab 03/06/13 0511 03/12/13 1222  INR 1.40 1.69*   ------------------------------------------------------------------------------------------------------------------- ------------------------------------------------------------------------------------------------------------------- Cardiac Enzymes  Recent Labs Lab 03/12/13 0945  TROPONINI <0.30   -----------------------------------------------------------------------------------------------------------------  ---------------------------------------------------------------------------------------------------------------  Urinalysis    Component Value Date/Time   COLORURINE YELLOW  03/12/2013 1034   APPEARANCEUR CLEAR 03/12/2013 1034   LABSPEC 1.011 03/12/2013 1034   PHURINE 6.5 03/12/2013 1034   GLUCOSEU NEGATIVE 03/12/2013 1034   HGBUR NEGATIVE 03/12/2013 1034   HGBUR negative 08/02/2007 [place] 03/12/2013 [place] 03/12/2013 1034   PROTEINUR NEGATIVE 03/12/2013 1034   UROBILINOGEN 1.0 03/12/2013 1034   NITRITE NEGATIVE 03/12/2013 1034   [place] 03/12/2013 1034    ----------------------------------------------------------------------------------------------------------------  Imaging results:   Dg Chest 2 View  03/03/2013   CLINICAL DATA:  Atelectasis.  EXAM: CHEST  2 VIEW  COMPARISON:  Plain film of the chest 03/02/2013.  FINDINGS: Right IJ sheath has been removed. The patient is status post CABG. There are small bilateral pleural effusions and basilar atelectasis. Interstitial edema seen on the prior study is resolved. Hiatal hernia is noted.  IMPRESSION: Small bilateral pleural effusions and basilar atelectasis.  Resolved interstitial edema.  Hiatal hernia.   Electronically Signed   By: Inge Rise M.D.   On: 03/03/2013 04:28   Dg Chest 2 View  02/27/2013   CLINICAL DATA:  Shortness of breath  EXAM: CHEST  2 VIEW  COMPARISON:  02/25/2013  FINDINGS: Cardiac shadow remains enlarged. A large hiatal hernia is again identified. The lungs are well aerated bilaterally. No focal infiltrate is seen. No acute bony abnormality is noted. An old left clavicular fracture is again noted.  IMPRESSION: No acute abnormality seen.   Electronically Signed   By: Inez Catalina M.D.   On: 02/27/2013 16:18   Ct Angio Chest Pe W/cm &/or Wo Cm  03/12/2013   CLINICAL DATA:  Near syncope. Shortness of breath. Chest pain. Clinical suspicion for pulmonary embolism.  EXAM: CT ANGIOGRAPHY CHEST WITH CONTRAST  TECHNIQUE: Multidetector CT imaging of the chest was performed using the standard protocol during bolus administration of intravenous contrast.  Multiplanar CT image reconstructions including MIPs were obtained to evaluate the vascular anatomy.  CONTRAST:  187mL OMNIPAQUE IOHEXOL 350 MG/ML SOLN  COMPARISON:  Chest radiograph on 03/12/13  FINDINGS: Satisfactory opacification of pulmonary arteries noted, and no pulmonary emboli identified.  No evidence of thoracic aortic dissection or aneurysm. No evidence of mediastinal hematoma or mass. A large hiatal hernia is seen. No evidence of lymphadenopathy.  Moderate size bilateral pleural effusions are seen with compressive atelectasis in both lower lobes. Tiny pericardial effusion also noted. Diffuse interstitial thickening and ground-glass pulmonary opacity seen bilaterally, suspicious for mild pulmonary edema.  Review of the MIP images confirms the above findings.  IMPRESSION: No evidence of pulmonary embolism.  Findings consistent with mild congestive heart failure. Moderate bilateral pleural effusions and lower lobe compressive atelectasis.  Large hiatal hernia.   Electronically Signed   By: Earle Gell M.D.   On: 03/12/2013 12:27   Dg Chest Port 1 View  03/12/2013   CLINICAL DATA:  Syncope. Shortness of breath. Decreased breath sounds in left lung base.  EXAM: PORTABLE CHEST - 1 VIEW  COMPARISON:  03/03/2013  FINDINGS: Cardiomegaly and prior CABG again noted. Pulmonary vascular congestion again demonstrated, however there is no evidence of frank pulmonary edema. There is increased opacity in the medial left retrocardiac lung base, and left lower lobe pneumonia cannot be excluded.  IMPRESSION: Increased opacity in medial left retrocardiac lung base. Cannot exclude left lower lobe pneumonia. Follow-up with two view chest radiograph recommended.  Stable cardiomegaly and pulmonary vascular congestion.   Electronically Signed   By: Earle Gell M.D.   On: 03/12/2013 10:31   Dg Chest Port 1 View  03/02/2013   CLINICAL DATA:  Postop for median sternotomy.  Hypertension.  EXAM: PORTABLE CHEST - 1 VIEW  COMPARISON:   DG CHEST 1V PORT dated 03/01/2013; DG CHEST 1V PORT dated 02/28/2013  FINDINGS: Removal of endotracheal and nasogastric tubes. Swan-Ganz catheter removal with right IJ Cordis sheath remaining in place. The sheath is likely kinked at the skin surface. Median sternotomy with left atrial appendage occlusion device. Removal of mediastinal drain . A left-sided chest tube remains in place.  Cardiomegaly accentuated by AP portable technique. Probable small bilateral pleural effusions. No pneumothorax. Diminished lung volumes. This accentuates the pulmonary interstitium. Question mild superimposed pulmonary venous congestion. Increased right and similar left base airspace disease.  IMPRESSION: Extubation. Decreased lung volumes with suspicion of developing pulmonary venous congestion.  Increased right and similar left base Airspace disease, likely atelectasis.  Probable small bilateral pleural effusions, but no pneumothorax.   Electronically Signed   By: Abigail Miyamoto M.D.   On: 03/02/2013 07:32   My personal review of EKG: Rhythm: A Fib, Rate  85 /min, QTc 436, no Acute ST changes    Assessment & Plan  Principal Problem:   Pre-syncope Active Problems:   CHF (congestive heart failure)   HYPOTHYROIDISM   HYPERTENSION   S/P mitral valve repair, maze procedure, and CABG x2 02/29/12   Anemia   Atrial fibrillation   Subtherapeutic international normalized ratio (INR)   Hypotension, unspecified   Pleural effusion, bilateral   Principal Problem:  Acute Delirium:  -Started immediately post surgery. -Reviewed pts meds and could be secondary to Amiodarone, post op complication or underlying dementia,? Narcotic use for postoperative pain. Although Amiodarone can cause Hallucinations-suspect other causes to be the etiology here, hold for now.If better rate control needed, suspect amiodarone can be resumed. -Have ordered TSH, B12, Folate, RPR, to rule out reversible causes of dementia -urine cultures pending   -Haldol PRN  Active Problems:  Pre-Syncope: -Likely secondary to vagal response during BM with underlying Hypotension, could be from orthostatics -Will CT Head to be safe - patient is on coumadin. -Have  ordered Orthostatics  -Will defer decision on echo to cards / cvts -CVTS consulted as MVR surgery was very recent. -Cards consulted.  Hypotension: -Continue with low dose Metoprolol for now-attempt to cautiously diurese with lasix -Have ordered Orthostatics for tomorrow  A Fib:  -On admission INR is sub therapeutic at 1.69 -attempt rate control with metoprolol for now, amiodarone on hold -Coumadin per pharmacy  Acute diastolic CHF:  -Pt has B/L pleural effusions per CT scan; see below.  -start IV lasix, given soft BP on admission beta blockers were diuresis is being attempted -Did not start the pt on ACEI due to Hypotension upon admission -Low salt diet, daily weights, In and Outs. -Have consulted Cardiology  Anemia:  -Hgb was 10.5 upon admission  -Was d/c on 01.10.15 with a Hgb of 9.6 -Likely secondary to Cardiac Surgery done on 01.07.15 -Will transfuse if Hgb drops below 8 or pt has significant sxs  Sub-Therapeutic INR:  -On admission INR 1.69 -Coumadin per pharmacy consult   Hypothyroidism:  -Have ordered TSH -Continue Levothyroxine   DVT Prophylaxis:   Coumadin   AM Labs Ordered, also please review Full Orders  Family Communication:      Code Status: Full Code   Likely DC to  SNF for rehab before going home.   Condition: Stable and Inpatient   Time spent in minutes : 60   Lang Snow A PA-S on 03/12/2013 at 2:52 PM  Between 7am to 7pm - Pager - [phone]  After 7pm go to [email or web address] - password [email or web address]  And look for the night coverage person covering me after hours  [facility] Office  [phone]

## 2013-03-12 NOTE — ED Notes (Signed)
Pt given drink as permitted by Dr Zenia Resides.

## 2013-03-12 NOTE — H&P (Signed)
[address]       [place] [address]             [phone]          CARDIOTHORACIC SURGERY HISTORY AND PHYSICAL EXAM  PCP is [name], MD Referring Provider is Leota Jacobsen, MD Primary Cardiologist is ROSS, [name], MD   Chief Complaint:  Shortness of breath and near syncope  HPI:  Patient is well-known to me from recent hospitalization.  He is an 80 year old retired [employer] fund[employer] from [place] with no previous history of coronary artery disease but history notable for long-standing hypertension, chronic persistent atrial fibrillation, and prostate cancer. The patient apparently was first told he had atrial fibrillation many years ago by Dr. Joni Fears. Patient has always resisted anticoagulation treatment with Coumadin for a variety of reasons including a history of frequent falls. In 2012 the patient was hospitalized with along with rapid atrial fibrillation. At that time the patient had left shoulder pain but was not aware of irregular heart rhythms. The patient was seen this past October for atrial fibrillation by Dr. Harrington Challenger. Other than feeling chronic fatigue with intermittent left shoulder pain, the patient did not seem to be symptomatic. The first week of January he presented with accelerating symptoms of angina pectoris. He was found to have severe three-vessel coronary artery disease with preserved left ventricular systolic function. Although preoperative transthoracic echocardiogram revealed only moderate aortic insufficiency and moderate mitral regurgitation, transesophageal echocardiogram performed at the time of surgery revealed that the mitral regurgitation was at least moderate if not severe due to the presence of mitral valve prolapse with a flail segment of the posterior leaflet. The patient subsequently underwent mitral valve repair, coronary artery bypass grafting x2, and Maze procedure on 02/28/2013.  The patient's postoperative recovery  in the hospital was somewhat slow but overall uncomplicated.  Although he initially remained in sinus rhythm, prior to discharge he went back into rate-controlled atrial fibrillation.  He was discharged home the end of last week with home health arrangements in place and round-the-clock care provided by his family.  Since going home the patient has not progressed. By report he has not been ambulating as much as he was in the hospital prior to discharge. He has had some shortness of breath intermittently. He has had some mild confusion, particularly at night. He has not been sleeping well.  This morning while getting up from the commode after having a bowel movement he became severely dizzy and weak, calling his son for help. Although he did not sustain a fall, his son had to support him and sit him in a chair. EMS was called and he was brought to the emergency department.  Past Medical History  Diagnosis Date  . Personal history of colonic polyps     tubular adenoma  . Hypertension   . Irritable bowel syndrome   . Prostate cancer 1991    sees Dr. Carlota Raspberry at [facility], observation only   . Atrial fibrillation   . S/P mitral valve repair, maze procedure, and CABG x2 02/28/2013    Complex valvuloplasty including triangular resection of posterior leaflet, 30 mm Sorin Memo 3D ring annuloplasty  . S/P CABG x 2 02/28/2013    LIMA to LAD, SVG to OM1, EVH via right thigh  . S/P Maze operation for atrial fibrillation 02/28/2013    Complete bilateral atrial lesion set using bipolar radiofrequency and cryothermy ablation with clipping of LA appendage  .  Pleural effusion, bilateral 03/12/2013    Small to moderate, L>R    Past Surgical History  Procedure Laterality Date  . Hemorrhoid surgery    . Prostate biopsy    . Colonoscopy  2011    per Dr. Sharlett Iles, benign polyps, no repeats needed   . Cataract extraction, bilateral  2014    per Dr. Gershon Crane   . Coronary artery bypass graft N/A 02/28/2013    Procedure:  CORONARY ARTERY BYPASS GRAFTING (CABG);  Surgeon: Rexene Alberts, MD;  Location: [facility];  Service: Open Heart Surgery;  Laterality: N/A;  CABG x 2,  using left internal mammary artery and right leg saphenous vein harvested endoscopically  . Intraoperative transesophageal echocardiogram N/A 02/28/2013    Procedure: INTRAOPERATIVE TRANSESOPHAGEAL ECHOCARDIOGRAM;  Surgeon: Rexene Alberts, MD;  Location: [facility];  Service: Open Heart Surgery;  Laterality: N/A;  . Maze N/A 02/28/2013    Procedure: MAZE;  Surgeon: Rexene Alberts, MD;  Location: [facility];  Service: Open Heart Surgery;  Laterality: N/A;  . Mitral valve repair N/A 02/28/2013    Procedure: MITRAL VALVE REPAIR (MVR);  Surgeon: Rexene Alberts, MD;  Location: [facility];  Service: Open Heart Surgery;  Laterality: N/A;    Family History  Problem Relation Age of Onset  . Diabetes    . Hypertension    . Lung cancer Brother   . Sudden death    . Heart disease    . Stroke    . Heart disease Son   . Stomach cancer Brother     Social History History  Substance Use Topics  . Smoking status: Never Smoker   . Smokeless tobacco: Never Used  . Alcohol Use: No    Prior to Admission medications   Medication Sig Start Date End Date Taking? Authorizing Provider  amiodarone (PACERONE) 200 MG tablet Take 1 tablet (200 mg total) by mouth 2 (two) times daily after a meal. For 1 week, then decrease to 200 mg daily 03/06/13  Yes Erin Barrett, PA-C  aspirin 325 MG tablet Take 325 mg by mouth daily.    Yes Historical Provider, MD  atorvastatin (LIPITOR) 10 MG tablet Take 10 mg by mouth daily. At 6pm   Yes Historical Provider, MD  Cholecalciferol (VITAMIN D) 1000 UNITS capsule Take 2,000 Units by mouth every morning.    Yes Historical Provider, MD  fish oil-omega-3 fatty acids 1000 MG capsule Take 1 g by mouth every morning.    Yes Historical Provider, MD  levothyroxine (SYNTHROID, LEVOTHROID) 50 MCG tablet Take 1 tablet (50 mcg total) by mouth daily. 09/25/12  Yes  [name], MD  lisinopril (PRINIVIL,ZESTRIL) 2.5 MG tablet Take 1 tablet (2.5 mg total) by mouth daily. 03/06/13  Yes Erin Barrett, PA-C  metoprolol tartrate (LOPRESSOR) 25 MG tablet Take 12.5 mg by mouth 2 (two) times daily.   Yes Historical Provider, MD  traMADol (ULTRAM) 50 MG tablet Take 1 tablet (50 mg total) by mouth every 6 (six) hours as needed for moderate pain. 03/06/13  Yes Erin Barrett, PA-C  vitamin C (ASCORBIC ACID) 500 MG tablet Take 500 mg by mouth every morning.    Yes Historical Provider, MD  warfarin (COUMADIN) 2.5 MG tablet Take 1 tablet (2.5 mg total) by mouth daily at 6 PM. 03/06/13  Yes Erin Barrett, PA-C    No Known Allergies  Review of Systems:  General:  decreased appetite, decreased energy   Respiratory:  no cough, no wheezing, no hemoptysis, no pain  with inspiration or cough, + shortness of breath   Cardiac:   no chest pain or tightness, + exertional SOB, no resting SOB, no PND, no orthopnea, no LE edema, no palpitations, no syncope  GI:   no difficulty swallowing, no hematochezia, no hematemesis, no melena, no constipation, no diarrhea, + h/o IBS  GU:   no dysuria, no urgency, no frequency   Musculoskeletal: no arthritis, no arthralgia   Vascular:  no pain suggestive of claudication   Neuro:   no symptoms suggestive of TIA's, no seizures, no headaches, no peripheral neuropathy, + confusion but he is currently oriented x3  Endocrine:  Negative   HEENT:  no loose teeth or painful teeth,  no recent vision changes  Psych:   no anxiety, no depression    Physical Exam:   BP 97/68  Pulse 85  Temp(Src) 98.4 F (36.9 C) (Oral)  Resp 16  SpO2 96%  General:  Thin and frail-appearing  HEENT:  Unremarkable   Neck:   no JVD, no bruits, no adenopathy   Chest:   clear to auscultation, symmetrical breath sounds, no wheezes, no rhonchi   CV:   RRR, no murmur   Abdomen:  soft, non-tender, no masses   Extremities:  warm, well-perfused, pulses not palpable, trace LE  edema  Rectal/GU  Deferred  Neuro:   Grossly non-focal and symmetrical throughout  Skin:   Clean and dry, no rashes, no breakdown  Diagnostic Tests:  CT ANGIOGRAPHY CHEST WITH CONTRAST  TECHNIQUE:  Multidetector CT imaging of the chest was performed using the  standard protocol during bolus administration of intravenous  contrast. Multiplanar CT image reconstructions including MIPs were  obtained to evaluate the vascular anatomy.  CONTRAST: 172mL OMNIPAQUE IOHEXOL 350 MG/ML SOLN  COMPARISON: Chest radiograph on 03/12/13  FINDINGS:  Satisfactory opacification of pulmonary arteries noted, and no  pulmonary emboli identified. No evidence of thoracic aortic  dissection or aneurysm. No evidence of mediastinal hematoma or mass.  A large hiatal hernia is seen. No evidence of lymphadenopathy.  Moderate size bilateral pleural effusions are seen with compressive  atelectasis in both lower lobes. Tiny pericardial effusion also  noted. Diffuse interstitial thickening and ground-glass pulmonary  opacity seen bilaterally, suspicious for mild pulmonary edema.  Review of the MIP images confirms the above findings.  IMPRESSION:  No evidence of pulmonary embolism.  Findings consistent with mild congestive heart failure. Moderate  bilateral pleural effusions and lower lobe compressive atelectasis.  Large hiatal hernia.  Electronically Signed  By: Earle Gell M.D.  On: 03/12/2013 12:27   Impression:  The patient has not progressed since hospital discharge and appears to be failing to thrive. He has shortness of breath related to chronic diastolic congestive heart failure with mild congestion on exam, although overall he does not appear to be very volume overloaded.  He does have small to moderate sized bilateral pleural effusions, somewhat larger on the left than the right.  Neither are terribly large, but left thoracentesis could be considered for therapeutic benefit if shortness of breath remains a  problem or gets worse.  The patient's near syncopal episode is likely related to his marginal blood pressure. Given the relatively low blood pressure I would be reluctant to push diuretics very aggressively. Overall I suspect the patient will ultimately need transfer to inpatient rehabilitation or alternatively discharged to a skilled nursing facility, as I think it is clear that the home arrangements are not adequate for the level of support he requires.  Recommendations:  I agree with plans to hold the patient's ACE inhibitor as long as his blood pressure remains somewhat low. I would favor checking a followup echocardiogram. The patient needs to resume cardiac rehabilitation and be out of bed as much as possible. He needs aggressive nutritional support. We will continue to follow along and consider ultrasound-guided thoracentesis.    Valentina Gu. Roxy Manns, MD

## 2013-03-13 ENCOUNTER — Encounter (HOSPITAL_COMMUNITY): Payer: Self-pay | Admitting: General Practice

## 2013-03-13 DIAGNOSIS — I5031 Acute diastolic (congestive) heart failure: Secondary | ICD-10-CM

## 2013-03-13 DIAGNOSIS — Z951 Presence of aortocoronary bypass graft: Secondary | ICD-10-CM

## 2013-03-13 DIAGNOSIS — R404 Transient alteration of awareness: Secondary | ICD-10-CM

## 2013-03-13 DIAGNOSIS — I359 Nonrheumatic aortic valve disorder, unspecified: Secondary | ICD-10-CM

## 2013-03-13 DIAGNOSIS — J90 Pleural effusion, not elsewhere classified: Secondary | ICD-10-CM

## 2013-03-13 LAB — COMPREHENSIVE METABOLIC PANEL
ALBUMIN: 2.9 g/dL — AB (ref 3.5–5.2)
ALT: 54 U/L — AB (ref 0–53)
AST: 36 U/L (ref 0–37)
Alkaline Phosphatase: 93 U/L (ref 39–117)
BUN: 12 mg/dL (ref 6–23)
CO2: 23 meq/L (ref 19–32)
CREATININE: 0.9 mg/dL (ref 0.50–1.35)
Calcium: 8.6 mg/dL (ref 8.4–10.5)
Chloride: 100 mEq/L (ref 96–112)
GFR calc Af Amer: 89 mL/min — ABNORMAL LOW (ref >90)
GFR, EST NON AFRICAN AMERICAN: 77 mL/min — AB (ref >90)
Glucose, Bld: 114 mg/dL — ABNORMAL HIGH (ref 70–99)
Potassium: 3.9 mEq/L (ref 3.7–5.3)
SODIUM: 137 meq/L (ref 137–147)
Total Bilirubin: 0.7 mg/dL (ref 0.3–1.2)
Total Protein: 6.4 g/dL (ref 6.0–8.3)

## 2013-03-13 LAB — PROTIME-INR
INR: 1.97 — AB (ref 0.00–1.49)
Prothrombin Time: 21.8 seconds — ABNORMAL HIGH (ref 11.6–15.2)

## 2013-03-13 LAB — CBC
HCT: 28.8 % — ABNORMAL LOW (ref 39.0–52.0)
HEMOGLOBIN: 9.7 g/dL — AB (ref 13.0–17.0)
MCH: 30.8 pg (ref 26.0–34.0)
MCHC: 33.7 g/dL (ref 30.0–36.0)
MCV: 91.4 fL (ref 78.0–100.0)
Platelets: 375 10*3/uL (ref 150–400)
RBC: 3.15 MIL/uL — AB (ref 4.22–5.81)
RDW: 13.5 % (ref 11.5–15.5)
WBC: 10.3 10*3/uL (ref 4.0–10.5)

## 2013-03-13 LAB — VITAMIN B12: Vitamin B-12: 1336 pg/mL — ABNORMAL HIGH (ref 211–911)

## 2013-03-13 LAB — FOLATE RBC: RBC Folate: 1156 ng/mL — ABNORMAL HIGH (ref >280)

## 2013-03-13 LAB — TSH: TSH: 2.827 u[IU]/mL (ref 0.350–4.500)

## 2013-03-13 LAB — RPR: RPR Ser Ql: NONREACTIVE

## 2013-03-13 MED ORDER — ASPIRIN EC 81 MG PO TBEC
81.0000 mg | DELAYED_RELEASE_TABLET | Freq: Every day | ORAL | Status: DC
  Administered 2013-03-13 – 2013-03-14 (×2): 81 mg via ORAL
  Filled 2013-03-13 (×2): qty 1

## 2013-03-13 MED ORDER — AMIODARONE HCL 200 MG PO TABS
200.0000 mg | ORAL_TABLET | Freq: Two times a day (BID) | ORAL | Status: DC
  Administered 2013-03-13 – 2013-03-14 (×3): 200 mg via ORAL
  Filled 2013-03-13 (×5): qty 1

## 2013-03-13 MED ORDER — ENSURE PUDDING PO PUDG
1.0000 | Freq: Two times a day (BID) | ORAL | Status: DC
  Administered 2013-03-13 – 2013-03-14 (×3): 1 via ORAL

## 2013-03-13 MED ORDER — VITAMIN D3 25 MCG (1000 UNIT) PO TABS
2000.0000 [IU] | ORAL_TABLET | Freq: Every day | ORAL | Status: DC
  Administered 2013-03-13 – 2013-03-14 (×2): 2000 [IU] via ORAL
  Filled 2013-03-13 (×2): qty 2

## 2013-03-13 MED ORDER — WARFARIN SODIUM 4 MG PO TABS
4.0000 mg | ORAL_TABLET | Freq: Once | ORAL | Status: AC
  Administered 2013-03-13: 4 mg via ORAL
  Filled 2013-03-13: qty 1

## 2013-03-13 NOTE — Progress Notes (Addendum)
      [address]       [place] [address]             [phone]     CARDIOTHORACIC SURGERY PROGRESS NOTE  Subjective: Mildly confused.  Reorients easily.  Denies pain, SOB  Objective: Vital signs in last 24 hours: Temp:  [98 F (36.7 C)-99.3 F (37.4 C)] 98 F (36.7 C) (01/20 0406) Pulse Rate:  [63-129] 73 (01/20 0406) Cardiac Rhythm:  [-] Atrial fibrillation (01/19 2000) Resp:  [14-33] 20 (01/20 0406) BP: (97-154)/(60-112) 114/69 mmHg (01/20 0406) SpO2:  [88 %-100 %] 99 % (01/20 0406) Weight:  [83.4 kg (183 lb 13.8 oz)-86.1 kg (189 lb 13.1 oz)] 83.4 kg (183 lb 13.8 oz) (01/20 0406)  Physical Exam:  Rhythm:   Afib w/ controlled rate  Breath sounds: Diminished at bases, otherwise clear  Heart sounds:  Irregular, no murmur  Incisions:  Clean and dry  Abdomen:  Soft, non-distended, non-tender  Extremities:  Warm, well-perfused    Intake/Output from previous day: 01/19 0701 - 01/20 0700 In: -  Out: 1100 [Urine:1100] Intake/Output this shift: Total I/O In: 360 [P.O.:360] Out: -   Lab Results:  Recent Labs  03/12/13 0945 03/13/13 0440  WBC 9.9 10.3  HGB 10.5* 9.7*  HCT 31.7* 28.8*  PLT 352 375   BMET:  Recent Labs  03/12/13 0945 03/13/13 0440  NA 135* 137  K 4.4 3.9  CL 97 100  CO2 25 23  GLUCOSE 100* 114*  BUN 17 12  CREATININE 0.93 0.90  CALCIUM 8.9 8.6    CBG (last 3)  No results found for this basename: GLUCAP,  in the last 72 hours PT/INR:   Recent Labs  03/13/13 0440  LABPROT 21.8*  INR 1.97*    CXR:  N/A  Assessment/Plan:  Hypotension with near syncope  Much improved overnight  Continue to hold ACE-I  Post-op delirium, mild, intermittent  Patient at high fall risk  Home arrangements inadequate  Typically patients like this improve as they recover from their surgery  Sedatives of all types should be avoided  Chronic diastolic congestive heart failure  Mild and clinically improving  Needs gentle  diuresis  Persistent atrial fibrillation  Rate controlled and stable  Therapeutic on coumadin  No roll for lovenox in this setting  Pleural effusions  Small to moderate  Continue to follow  Protein-depleted malnutrition  Encourage po's  Add dietary supplements  S/P mitral valve repair, CABG + maze  Check routine ECHO  Overall stable/improved  Re-consult cardiac rehab  Disposition  Ultimately will need transfer to inpatient rehab or d/c to SNF  Consult PT/OT  I would be happy to take Mr Couey on my service   OWEN,CLARENCE H 03/13/2013 8:06 AM

## 2013-03-13 NOTE — Progress Notes (Signed)
Patient ambulated in hallway 156ft; patient tolerated well; 1 assist with wheelchair; patient used wheelchair to hold on to.  BARNETT, Marsh Dolly

## 2013-03-13 NOTE — Evaluation (Signed)
Physical Therapy Evaluation Patient Details Name: [name]. MRN: [id number] DOB: 10-04-1927 Today's Date: 03/13/2013 Time: 5093-2671 PT Time Calculation (min): 25 min  PT Assessment / Plan / Recommendation History of Present Illness  [name]  is a 80 y.o. male, with a PMH of Mitral value repair, CABG, and Maze procedure on 01.07.15, a fib, HTN, IBS, and prostate CA. He is presenting today to the ED with pre syncope that occurred this morning while having a BM. Pt stated that he had SOB during the episode. Pt did not fall or hit head or lose consciousness during the episode. Pt and his family also admit to pt having visual hallucinations and progressive weakness which began after d/c from hospital after recent surgery. That patient realizes that his hallucinations are not real, yet he sees bugs on the wall.  This morning in the ED he saw old movie stars lined up in the hallway.  Family states that pt has a lot of agitation at night with inability to stay still and continued use of his arms despite surgeon's orders. Pt's family would like him to go to SNF for rehab upon d/c from this admission.   In the emergency department his BNP is elevated at 1901, and CT chest shows early CHF.  We will admit him for acute delirium and CHF.    Clinical Impression   Pt admitted with the above problems after recent d/c s/p CABG.  Pt currently with functional limitations due to the deficits listed below (see PT Problem List).  Pt will benefit from skilled PT to increase their independence and safety with mobility to allow discharge to the venue listed below.         PT Assessment  Patient needs continued PT services    Follow Up Recommendations  SNF    Does the patient have the potential to tolerate intense rehabilitation      Barriers to Discharge Decreased caregiver support      Equipment Recommendations  Other (comment) (TBA)    Recommendations for Other Services     Frequency Min  3X/week    Precautions / Restrictions Precautions Precautions: Fall;Sternal   Pertinent Vitals/Pain       Mobility  Transfers Overall transfer level: Needs assistance Transfers: Sit to/from Stand Sit to Stand: Min assist General transfer comment: cues to be more aware of sternal precautions Ambulation/Gait Ambulation/Gait assistance: Min assist;Min guard Ambulation Distance (Feet): 170 Feet Assistive device: None (but needs assistive device due to instability) Gait Pattern/deviations: Step-through pattern;Scissoring;Staggering left;Staggering right;Drifts right/left;Narrow base of support Gait velocity: Decreased Gait velocity interpretation: Below normal speed for age/gender    Exercises     PT Diagnosis: Generalized weakness;Abnormality of gait  PT Problem List: Decreased strength;Decreased balance;Decreased knowledge of use of DME;Decreased safety awareness PT Treatment Interventions: DME instruction;Gait training;Functional mobility training;Therapeutic activities;Stair training;Balance training;Patient/family education     PT Goals(Current goals can be found in the care plan section) Acute Rehab PT Goals Patient Stated Goal: Get home and be able to do everything I need to do . PT Goal Formulation: With patient Time For Goal Achievement: 03/20/13 Potential to Achieve Goals: Good  Visit Information  Last PT Received On: 03/13/13 Assistance Needed: +1 History of Present Illness: [name]  is a 80 y.o. male, with a PMH of Mitral value repair, CABG, and Maze procedure on 01.07.15, a fib, HTN, IBS, and prostate CA. He is presenting today to the ED with pre syncope that occurred this morning while having  a BM. Pt stated that he had SOB during the episode. Pt did not fall or hit head or lose consciousness during the episode. Pt and his family also admit to pt having visual hallucinations and progressive weakness which began after d/c from hospital after recent surgery. That  patient realizes that his hallucinations are not real, yet he sees bugs on the wall.  This morning in the ED he saw old movie stars lined up in the hallway.  Family states that pt has a lot of agitation at night with inability to stay still and continued use of his arms despite surgeon's orders. Pt's family would like him to go to SNF for rehab upon d/c from this admission.   In the emergency department his BNP is elevated at 1901, and CT chest shows early CHF.  We will admit him for acute delirium and CHF.         Prior [place] expects to be discharged to:: Skilled nursing facility Living Arrangements: Spouse/significant other Available Help at Discharge: Family;Other (Comment) (but pt is usually the caregiver and needs to be I) Type of Home: House Home Access: Stairs to enter [employer] of Steps: 3 Entrance Stairs-Rails: Right;Left Home Layout: Two level;Bed/bath upstairs Alternate Level Stairs-Number of Steps: 13 Alternate Level Stairs-Rails: Right;Left Home Equipment: Cane - single point Prior Function Level of Independence: Independent with assistive device(s);Independent Communication Communication: No difficulties;HOH    Cognition  Cognition Arousal/Alertness: Awake/alert Behavior During Therapy: WFL for tasks assessed/performed Overall Cognitive Status: Impaired/Different from baseline    Extremity/Trunk Assessment Lower Extremity Assessment Lower Extremity Assessment: Overall WFL for tasks assessed;Generalized weakness Cervical / Trunk Assessment Cervical / Trunk Assessment: Normal   Balance Standardized Balance Assessment Standardized Balance Assessment : Berg Balance Test Berg Balance Test Sit to Stand: Able to stand  independently using hands (no use of hands) Standing Unsupported: Able to stand safely 2 minutes Sitting with Back Unsupported but Feet Supported on Floor or Stool: Able to sit safely and securely 2  minutes Stand to Sit: Controls descent by using hands (no hands) Transfers: Able to transfer safely, definite need of hands Standing Unsupported with Eyes Closed: Able to stand 10 seconds safely Standing Ubsupported with Feet Together: Able to place feet together independently and stand 1 minute safely From Standing, Reach Forward with Outstretched Arm: Can reach forward >12 cm safely (5") From Standing Position, Pick up Object from Floor: Able to pick up shoe, needs supervision From Standing Position, Turn to Look Behind Over each Shoulder: Looks behind from both sides and weight shifts well Turn 360 Degrees: Able to turn 360 degrees safely one side only in 4 seconds or less Standing Unsupported, Alternately Place Feet on Step/Stool: Able to complete >2 steps/needs minimal assist Standing Unsupported, One Foot in Front: Able to plae foot ahead of the other independently and hold 30 seconds Standing on One Leg: Able to lift leg independently and hold equal to or more than 3 seconds Total Score: 44  End of Session PT - End of Session Activity Tolerance: Patient tolerated treatment well Patient left: in chair;with chair alarm set Nurse Communication: Mobility status  GP     [place], Tessie Fass 03/13/2013, 11:25 AM 03/13/2013  Donnella Sham, [place] [phone]  (pager)

## 2013-03-13 NOTE — Progress Notes (Signed)
Subjective: No SOB or complaints.    Objective: Vital signs in last 24 hours: Temp:  [98 F (36.7 C)-99.3 F (37.4 C)] 98 F (36.7 C) (01/20 0406) Pulse Rate:  [63-129] 73 (01/20 0406) Resp:  [14-33] 20 (01/20 0406) BP: (97-154)/(60-112) 114/69 mmHg (01/20 0406) SpO2:  [88 %-100 %] 99 % (01/20 0406) Weight:  [183 lb 13.8 oz (83.4 kg)-189 lb 13.1 oz (86.1 kg)] 183 lb 13.8 oz (83.4 kg) (01/20 0406) Last BM Date: 03/12/13  Intake/Output from previous day: 01/19 0701 - 01/20 0700 In: -  Out: 1100 [Urine:1100] Intake/Output this shift: Total I/O In: 360 [P.O.:360] Out: 50 [Urine:50]  Medications Current Facility-Administered Medications  Medication Dose Route Frequency Provider Last Rate Last Dose  . 0.9 %  sodium chloride infusion  250 mL Intravenous PRN Melton Alar, PA-C      . acetaminophen (TYLENOL) tablet 650 mg  650 mg Oral Q6H PRN Melton Alar, PA-C   650 mg at 03/12/13 2105   Or  . acetaminophen (TYLENOL) suppository 650 mg  650 mg Rectal Q6H PRN Melton Alar, PA-C      . alum & mag hydroxide-simeth (MAALOX/MYLANTA) 200-200-20 MG/5ML suspension 30 mL  30 mL Oral Q6H PRN Melton Alar, PA-C      . amiodarone (PACERONE) tablet 200 mg  200 mg Oral BID PC Shanker Kristeen Mans, MD      . aspirin EC tablet 81 mg  81 mg Oral Daily Rexene Alberts, MD      . atorvastatin (LIPITOR) tablet 10 mg  10 mg Oral q1800 Melton Alar, PA-C   10 mg at 03/12/13 2108  . docusate sodium (COLACE) capsule 100 mg  100 mg Oral BID Melton Alar, PA-C   100 mg at 03/12/13 2223  . furosemide (LASIX) injection 20 mg  20 mg Intravenous Daily Jonetta Osgood, MD   20 mg at 03/12/13 2101  . levothyroxine (SYNTHROID, LEVOTHROID) tablet 50 mcg  50 mcg Oral QAC breakfast Melton Alar, PA-C   50 mcg at 03/13/13 0636  . metoprolol tartrate (LOPRESSOR) tablet 12.5 mg  12.5 mg Oral BID Melton Alar, PA-C   12.5 mg at 03/12/13 2224  . omega-3 acid ethyl esters (LOVAZA) capsule 1 g  1 g  Oral q morning - 10a Shanker Kristeen Mans, MD      . ondansetron [facility]) tablet 4 mg  4 mg Oral Q6H PRN Melton Alar, PA-C       Or  . ondansetron [facility]) injection 4 mg  4 mg Intravenous Q6H PRN Melton Alar, PA-C      . sodium chloride 0.9 % injection 3 mL  3 mL Intravenous Q12H Marianne L York, PA-C      . sodium chloride 0.9 % injection 3 mL  3 mL Intravenous Q12H Melton Alar, PA-C   3 mL at 03/13/13 0134  . sodium chloride 0.9 % injection 3 mL  3 mL Intravenous PRN Melton Alar, PA-C      . vitamin C (ASCORBIC ACID) tablet 500 mg  500 mg Oral q morning - 10a Bobby Rumpf York, PA-C      . Vitamin D 2,000 Units  2,000 Units Oral q morning - 10a Marianne L York, PA-C      . warfarin (COUMADIN) tablet 4 mg  4 mg Oral Once Pat Patrick, RPH      . Warfarin - Pharmacist Dosing Inpatient   Does  not apply q1800 Pat Patrick, RPH        PE: General appearance: alert, cooperative and no distress Neck: no JVD Lungs: clear to auscultation bilaterally Heart: irregularly irregular rhythm Abdomen: +BS Extremities: Trace ankle edema Pulses: 2+ and symmetric 0 left DP 2+ right DP both feet are warm. Skin: Warm and dry Neurologic: Grossly normal  Lab Results:   Recent Labs  03/12/13 0945 03/13/13 0440  WBC 9.9 10.3  HGB 10.5* 9.7*  HCT 31.7* 28.8*  PLT 352 375   BMET  Recent Labs  03/12/13 0945 03/13/13 0440  NA 135* 137  K 4.4 3.9  CL 97 100  CO2 25 23  GLUCOSE 100* 114*  BUN 17 12  CREATININE 0.93 0.90  CALCIUM 8.9 8.6   PT/INR  Recent Labs  03/12/13 1222 03/13/13 0440  LABPROT 19.4* 21.8*  INR 1.69* 1.97*    Assessment/Plan   Principal Problem:   Pre-syncope Active Problems:   ADENOCARCINOMA, PROSTATE, GLEASON GRADE 3   HYPOTHYROIDISM   HYPERLIPIDEMIA   HYPERTENSION   PAF- pre and post op, now on Amiodarone   S/P mitral valve repair, maze procedure, and CABG x2 02/29/12   CAD - LIMA-LAD, SVG-OM Feb 29 2012   CHF (congestive heart  failure)   Anemia- post op   Atrial fibrillation- back in AF with CVR   Subtherapeutic international normalized ratio (INR)   Hypotension, unspecified   Pleural effusion, bilateral   Delirium  Plan :  Recent mitral valve repair, coronary artery bypass grafting x2, and Maze procedure on 02/28/2013.   Maintaining Afib with a controlled rate.  INR just below therapeutic.  BP controlled this morning.  No acute finding on head ct.  No PE on CTA.  Mild CHF.  Net fluids: -1.1L however, no intake charted.  Amiodarone 200mg  BID, ASA, coumadin, lipitor, lopresor 12.5, lovaza.  Weight is 183 which is decreased from 189 during previous admission.  Needs PT to get him up moving.     LOS: 1 day    HAGER, BRYAN 03/13/2013 8:38 AM   Agree with above. Personally seen and examined.  Reviewed Dr. Guy Sandifer note.  Gentle diuresis, pleural effusions.  Afib, rate controlled. Continue with amiodarone.  Will follow along.    Candee Furbish, MD

## 2013-03-13 NOTE — Progress Notes (Signed)
Clinical Social Work Department BRIEF PSYCHOSOCIAL ASSESSMENT 03/13/2013  Patient:  Luis, [name]     Account Number:  [id number]     Admit date:  03/12/2013  Clinical Social Worker:  Megan Salon  Date/Time:  03/13/2013 02:51 PM  Referred by:  Physician  Date Referred:  03/13/2013 Referred for  SNF Placement   Other Referral:   Interview type:  Other - See comment Other interview type:   CSW spoke to patient and patient's wife by bedside. Patient's wife asked social worker to also call patient's daughter.    PSYCHOSOCIAL DATA Living Status:  WIFE Admitted from facility:   Level of care:   Primary support name:  [name] Primary support relationship to patient:  SPOUSE Degree of support available:   Good, supportive family    CURRENT CONCERNS Current Concerns  Post-Acute Placement   Other Concerns:    SOCIAL WORK ASSESSMENT / PLAN Clinical Social Worker received referral for SNF placement at d/c. CSW introduced self and explained reason for visit. Patient had visitor by bedside, patient's wife. CSW explained SNF process and provided SNF packet to patient and family.  Patient reported he is agreeable for SNF placement. CSW will complete FL2 for MD's signature and will update patient and family when bed offers are received.    CSW then called patient's daughter to explain the SNF process and explain the social worker role in patient's discharge. Daughter stated she understands and is agreeable to placement. CSW explained to daughter that family may not have a lot of time to visit facilities, so social worker encouraged patient's daughter to go ahead and start looking at the list and thinking about a facility. Daughter stated she will do that. Social worker then explained that she will provide bed offers to patient and patient's family tomorrow morning. Daughter thanked Education officer, museum for visit.   Assessment/plan status:  Psychosocial Support/Ongoing Assessment of  Needs Other assessment/ plan:   Information/referral to community resources:   SNF list    PATIENT'S/FAMILY'S RESPONSE TO PLAN OF CARE: Patient and patient's wife were in agreement to the snf option at discharge. Patient stated that he and his wife will make the decision of placement, but to inform patient's daughter of plan. Patient and patient's wife thanked Education officer, museum for coming in to speak with them.        Jeanette Caprice, MSW, [place]

## 2013-03-13 NOTE — Progress Notes (Signed)
[facility] for Coumadin Indication: afib, MV repair  No Known Allergies  Labs:  Recent Labs  03/12/13 0945 03/12/13 1222 03/13/13 0440  HGB 10.5*  --  9.7*  HCT 31.7*  --  28.8*  PLT 352  --  375  APTT  --  36  --   LABPROT  --  19.4* 21.8*  INR  --  1.69* 1.97*  CREATININE 0.93  --  0.90  TROPONINI <0.30  --   --     Estimated Creatinine Clearance: 62.2 ml/min (by C-G formula based on Cr of 0.9).  Assessment: 80 yo male on chronic Coumadin for afib and MV repair admitted with failure to thrive,  SOB, and mild congestion from CHF.  S/p CABG x 2, mitral valve repair and MAZE 02/28/13.  INR = 1.97  Goal of Therapy:  INR 2-3 Monitor platelets by anticoagulation protocol: Yes   Plan:  1. Repeat Coumadin 4 mg x 1 tonight. 2. Daily PT/INR.  Thank you. Anette Guarneri, PharmD [phone]   03/13/2013 9:22 AM

## 2013-03-13 NOTE — Progress Notes (Signed)
Patient ambulated in hallway with front wheel walker; distance ambulated-517ft; patient tolerated well; patient in chair with call light in reach; chair alarm on.  BARNETT, [place]

## 2013-03-13 NOTE — Progress Notes (Signed)
CARDIAC REHAB PHASE I   PRE:  Rate/Rhythm: 73 afib    BP: sitting 94/60    SaO2:   MODE:  Ambulation: 550 ft   POST:  Rate/Rhythm: 95 afib    BP: sitting 126/70     SaO2:   Pt to BR (for BM) then walked with assist x1. Unsteady without RW (150 ft). Used RW rest of walk. Denied problems. BP low but stable, increased after walk.  Will f/u. [phone]  Josephina Shih [place] CES, ACSM 03/13/2013 2:30 PM

## 2013-03-13 NOTE — Care Management Note (Signed)
    Page 1 of 1   03/14/2013     4:47:00 PM   CARE MANAGEMENT NOTE 03/14/2013  Patient:  [name], [name]   Account Number:  [id number]  Date Initiated:  03/13/2013  Documentation initiated by:  Eland Lamantia  Subjective/Objective Assessment:   PT ADM ON 1/19 WITH PRESYNCOPE S/P RECENT CABG.  PTA, PT RESIDES AT [facility].     Action/Plan:   WILL LIKELY NEED SNF AT DC; PT/OT PENDING.  CSW CONSULTED TO FACILITATE DC TO SNF, IF NEEDED.   Anticipated DC Date:  03/15/2013   Anticipated DC Plan:  SKILLED NURSING FACILITY  In-house referral  Clinical Social Worker      DC Planning Services  CM consult      Choice offered to / List presented to:             Status of service:  Completed, signed off Medicare Important Message given?   (If response is "NO", the following Medicare IM given date fields will be blank) Date Medicare IM given:   Date Additional Medicare IM given:    Discharge Disposition:  [place]  Per UR Regulation:  Reviewed for med. necessity/level of care/duration of stay  If discussed at [place] of Stay Meetings, dates discussed:    Comments:  03/14/13 Anjalee Cope,RN,BSN [phone] PT DISCHARGED TO SNF TODAY, PER CSW ARRANGEMENTS.

## 2013-03-13 NOTE — Progress Notes (Addendum)
INITIAL NUTRITION ASSESSMENT  DOCUMENTATION CODES Per approved criteria  -Not Applicable   INTERVENTION: Ensure Pudding po BID, each supplement provides 170 kcal and 4 grams of protein RD to follow for nutrition care plan  NUTRITION DIAGNOSIS: Inadequate oral intake related to decreased appetite as evidenced by wife report  Goal: Pt to meet >/= 90% of their estimated nutrition needs   Monitor:  PO & supplemental intake, weight, labs, I/O's  Reason for Assessment: Consult  80 y.o. male  Admitting Dx: Pre-syncope  ASSESSMENT: Patient with PMH of recent MVR, Maze procedure and CABG 02/28/13; since discharge from hospital post-op patient has not been progressing well; + SOB, mild confusion, weakness; EMS was called and patient was brought to ER.  Patient receiving Echocardiogram upon RD visit.  Spoke with wife at bedside.  Reports she prepares 3 meals per day for Mr. [name], however, he only consumes small amounts at a sitting.  States his weight has been stable (UBW ~ 190 lb).  PO intake variable at 10-100% per flowsheet records.  He doesn't care for Ensure supplements.  RD offered Ensure Pudding -- patient's wife feels he might like it better -- RD to order.  Height: Ht Readings from Last 1 Encounters:  03/12/13 5\' 9"  (1.753 m)    Weight: Wt Readings from Last 1 Encounters:  03/13/13 183 lb 13.8 oz (83.4 kg)    Ideal Body Weight: 160 lb  % Ideal Body Weight: 114%  Wt Readings from Last 10 Encounters:  03/13/13 183 lb 13.8 oz (83.4 kg)  03/06/13 189 lb 11.2 oz (86.047 kg)  03/06/13 189 lb 11.2 oz (86.047 kg)  03/06/13 189 lb 11.2 oz (86.047 kg)  11/20/12 187 lb 1.9 oz (84.877 kg)  09/25/12 190 lb (86.183 kg)  05/30/12 192 lb (87.091 kg)  08/30/11 190 lb (86.183 kg)  02/26/11 186 lb (84.369 kg)  01/08/11 191 lb (86.637 kg)    Usual Body Weight: 187 lb  % Usual Body Weight: 98%  BMI:  Body mass index is 27.14 kg/(m^2).  Estimated Nutritional Needs: Kcal:  1800-2000 Protein: 90-100 gm Fluid: 1.8-2.0 L  Skin: sternal & leg surgical incisions   Diet Order: Cardiac  EDUCATION NEEDS: -No education needs identified at this time   Intake/Output Summary (Last 24 hours) at 03/13/13 1017 Last data filed at 03/13/13 0801  Gross per 24 hour  Intake    360 ml  Output   1150 ml  Net   -790 ml    Labs:   Recent Labs Lab 03/12/13 0945 03/13/13 0440  NA 135* 137  K 4.4 3.9  CL 97 100  CO2 25 23  BUN 17 12  CREATININE 0.93 0.90  CALCIUM 8.9 8.6  GLUCOSE 100* 114*    Scheduled Meds: . amiodarone  200 mg Oral BID PC  . aspirin EC  81 mg Oral Daily  . atorvastatin  10 mg Oral q1800  . docusate sodium  100 mg Oral BID  . furosemide  20 mg Intravenous Daily  . levothyroxine  50 mcg Oral QAC breakfast  . metoprolol tartrate  12.5 mg Oral BID  . omega-3 acid ethyl esters  1 g Oral q morning - 10a  . sodium chloride  3 mL Intravenous Q12H  . sodium chloride  3 mL Intravenous Q12H  . vitamin C  500 mg Oral q morning - 10a  . Vitamin D  2,000 Units Oral q morning - 10a  . warfarin  4 mg Oral ONCE-1800  .  Warfarin - Pharmacist Dosing Inpatient   Does not apply q1800    Continuous Infusions:   Past Medical History  Diagnosis Date  . Personal history of colonic polyps     tubular adenoma  . Hypertension   . Irritable bowel syndrome   . Prostate cancer 1991    sees Dr. Carlota Raspberry at [place], observation only   . Atrial fibrillation   . S/P mitral valve repair, maze procedure, and CABG x2 02/28/2013    Complex valvuloplasty including triangular resection of posterior leaflet, 30 mm Sorin Memo 3D ring annuloplasty  . S/P CABG x 2 02/28/2013    LIMA to LAD, SVG to OM1, EVH via right thigh  . S/P Maze operation for atrial fibrillation 02/28/2013    Complete bilateral atrial lesion set using bipolar radiofrequency and cryothermy ablation with clipping of LA appendage  . Pleural effusion, bilateral 03/12/2013    Small to moderate, L>R  .  Complication of anesthesia     "serious cognisance problems since my OR in January" (03/13/2013    Past Surgical History  Procedure Laterality Date  . Hemorrhoid surgery    . Prostate biopsy    . Colonoscopy  2011    per Dr. Sharlett Iles, benign polyps, no repeats needed   . Cataract extraction, bilateral Bilateral 2014    per Dr. Gershon Crane   . Coronary artery bypass graft N/A 02/28/2013    Procedure: CORONARY ARTERY BYPASS GRAFTING (CABG);  Surgeon: Rexene Alberts, MD;  Location: [facility];  Service: Open Heart Surgery;  Laterality: N/A;  CABG x 2,  using left internal mammary artery and right leg saphenous vein harvested endoscopically  . Intraoperative transesophageal echocardiogram N/A 02/28/2013    Procedure: INTRAOPERATIVE TRANSESOPHAGEAL ECHOCARDIOGRAM;  Surgeon: Rexene Alberts, MD;  Location: [facility];  Service: Open Heart Surgery;  Laterality: N/A;  . Maze N/A 02/28/2013    Procedure: MAZE;  Surgeon: Rexene Alberts, MD;  Location: [facility];  Service: Open Heart Surgery;  Laterality: N/A;  . Mitral valve repair N/A 02/28/2013    Procedure: MITRAL VALVE REPAIR (MVR);  Surgeon: Rexene Alberts, MD;  Location: [facility];  Service: Open Heart Surgery;  Laterality: N/A;  . Cardiac catheterization      Arthur Holms, RD, LDN Pager #: [phone] After-Hours Pager #: [phone]

## 2013-03-13 NOTE — Progress Notes (Signed)
Clinical Social Work Department CLINICAL SOCIAL WORK PLACEMENT NOTE 03/13/2013  Patient:  [name], [name]  Account Number:  [id number] Admit date:  03/12/2013  Clinical Social Worker:  Megan Salon  Date/time:  03/13/2013 03:03 PM  Clinical Social Work is seeking post-discharge placement for this patient at the following level of care:   [place]   (*CSW will update this form in Epic as items are completed)   03/13/2013  Patient/family provided with [place] Department of Clinical Social Work's list of facilities offering this level of care within the geographic area requested by the patient (or if unable, by the patient's family).  03/13/2013  Patient/family informed of their freedom to choose among providers that offer the needed level of care, that participate in Medicare, Medicaid or managed care program needed by the patient, have an available bed and are willing to accept the patient.    Patient/family informed of [facility]' ownership interest in [facility], as well as of the fact that they are under no obligation to receive care at this facility.  PASARR submitted to EDS on 03/13/2013 PASARR number received from EDS on 03/13/2013  FL2 transmitted to all facilities in geographic area requested by pt/family on  03/13/2013 FL2 transmitted to all facilities within larger geographic area on   Patient informed that his/her managed care company has contracts with or will negotiate with  certain facilities, including the following:     Patient/family informed of bed offers received:   Patient chooses bed at  Physician recommends and patient chooses bed at    Patient to be transferred to  on   Patient to be transferred to facility by   The following physician request were entered in Epic:   Additional Comments:  Jeanette Caprice, MSW, [place]

## 2013-03-13 NOTE — Progress Notes (Signed)
[facility] PROGRESS NOTE  [name]. MEQ:[id number] DOB: March 23, 1927 DOA: 03/12/2013 PCP: [name], MD  Assessment/Plan: 1. Functional decline/failure to thrive. I suspect patient having a history of underlying cognitive impairment, recent coronary artery bypass grafting/hospitalization contributing to development of delirium. CT scan of brain performed on admission showed no acute intracranial abnormalities. Lab work unremarkable as he does not appear to have an obvious source of infection, TSH within normal limits, white count at 10,300. Physical therapy/occupational therapy consult. I suspect he will require asthma placement. Will await further recommendations 2. Coronary artery disease. Status post coronary artery bypass grafting x2. Cardiology consulted. He does not appear to have active cardiac issues at this time. We'll continue antiplatelet therapy with aspirin, statin, beta blocker.  3. Atrial fibrillation. He remains rate controlled, we'll continue amiodarone and anticoagulation. 4. Anticoagulation. Pharmacy consulted for warfarin management. INR at 1.97 with PT of 21.8. 5. Protein calorie malnutrition. Provide protein boost 3 times a day 6. Near syncope. Suspect secondary to hypotension. Will pressures improved this morning. We'll continue to monitor, holding ACE inhibitor therapy.  Code Status: Full Code Disposition Plan: Physical therapy/occupational therapy consult, continue supportive care in the inpatient service.   Consultants:  Cardiothoracic surgery  Cardiology  Physical therapy  Occupational therapy    HPI/Subjective: Patient is an 80 year old gentleman who was admitted to the medicine service on 03/12/2013. He was recently admitted to [facility] hospital on 02/25/2013 at which time he underwent chest pain. On this date he was transferred to [facility] where he underwent cardiac catheterization 02/26/2013 and found to have three-vessel disease. On  02/28/2013 he underwent coronary artery bypass grafting x2 with LIMA to LAD and SVG to OM. Postoperative course complicated by development of atrial fibrillation for which he was treated with amiodarone and started on anticoagulation with Coumadin. He was discharged home in stable condition however has been doing poorly at home; developing functional decline, hallucinations, agitation.  Objective: Filed Vitals:   03/13/13 1449  BP: 91/56  Pulse: 98  Temp:   Resp:     Intake/Output Summary (Last 24 hours) at 03/13/13 1658 Last data filed at 03/13/13 1243  Gross per 24 hour  Intake    720 ml  Output   1650 ml  Net   -930 ml   Filed Weights   03/12/13 1830 03/13/13 0406  Weight: 86.1 kg (189 lb 13.1 oz) 83.4 kg (183 lb 13.8 oz)    Exam:   General:  Patient does not appear to be in acute distress, he is mildly confused however nontoxic, awake and alert.  Cardiovascular: Regular rate and rhythm normal S1-S2 pedal edema noted  Respiratory: Patient having mild bibasilar crackles, normal respiratory effort, slightly diminished breath sounds to bases.  Abdomen: Soft nontender nondistended positive bowel sound  Musculoskeletal: Pedal edema noted  Data Reviewed: Basic Metabolic Panel:  Recent Labs Lab 03/12/13 0945 03/13/13 0440  NA 135* 137  K 4.4 3.9  CL 97 100  CO2 25 23  GLUCOSE 100* 114*  BUN 17 12  CREATININE 0.93 0.90  CALCIUM 8.9 8.6   Liver Function Tests:  Recent Labs Lab 03/13/13 0440  AST 36  ALT 54*  ALKPHOS 93  BILITOT 0.7  PROT 6.4  ALBUMIN 2.9*   No results found for this basename: LIPASE, AMYLASE,  in the last 168 hours No results found for this basename: AMMONIA,  in the last 168 hours CBC:  Recent Labs Lab 03/12/13 0945 03/13/13 0440  WBC 9.9 10.3  NEUTROABS 8.4*  --   HGB 10.5* 9.7*  HCT 31.7* 28.8*  MCV 92.4 91.4  PLT 352 375   Cardiac Enzymes:  Recent Labs Lab 03/12/13 0945  TROPONINI <0.30   BNP (last 3  results)  Recent Labs  03/12/13 1222  PROBNP 1901.0*   CBG: No results found for this basename: GLUCAP,  in the last 168 hours  Recent Results (from the past 240 hour(s))  CULTURE, BLOOD (ROUTINE X 2)     Status: None   Collection Time    03/12/13 12:25 PM      Result Value Range Status   Specimen Description BLOOD RIGHT ANTECUBITAL   Final   Special Requests BOTTLES DRAWN AEROBIC AND ANAEROBIC 10MLS   Final   Culture  Setup Time     Final   Value: 03/12/2013 16:07     Performed at [facility]   Culture     Final   Value:        BLOOD CULTURE RECEIVED NO GROWTH TO DATE CULTURE WILL BE HELD FOR 5 DAYS BEFORE ISSUING A FINAL NEGATIVE REPORT     Performed at [facility]   Report Status PENDING   Incomplete  CULTURE, BLOOD (ROUTINE X 2)     Status: None   Collection Time    03/12/13 12:30 PM      Result Value Range Status   Specimen Description BLOOD RIGHT HAND   Final   Special Requests BOTTLES DRAWN AEROBIC AND ANAEROBIC 10MLS   Final   Culture  Setup Time     Final   Value: 03/12/2013 16:07     Performed at [facility]   Culture     Final   Value:        BLOOD CULTURE RECEIVED NO GROWTH TO DATE CULTURE WILL BE HELD FOR 5 DAYS BEFORE ISSUING A FINAL NEGATIVE REPORT     Performed at [facility]   Report Status PENDING   Incomplete     Studies: Ct Head Wo Contrast  03/12/2013   CLINICAL DATA:  Dizziness and confusion.  EXAM: CT HEAD WITHOUT CONTRAST  TECHNIQUE: Contiguous axial images were obtained from the base of the skull through the vertex without intravenous contrast.  COMPARISON:  Head CT scan 05/25/2012.  FINDINGS: The brain is atrophic with chronic microvascular ischemic change. No evidence of acute abnormality including infarction, hemorrhage, mass lesion, mass effect, midline shift or abnormal extra-axial fluid collection is identified. Extensive atherosclerotic vascular disease is again seen. The calvarium is intact. There is no  hydrocephalus or pneumocephalus.  IMPRESSION: No acute finding.  Atrophy, chronic microvascular ischemic change and atherosclerosis.   Electronically Signed   By: Inge Rise M.D.   On: 03/12/2013 19:29   Ct Angio Chest Pe W/cm &/or Wo Cm  03/12/2013   CLINICAL DATA:  Near syncope. Shortness of breath. Chest pain. Clinical suspicion for pulmonary embolism.  EXAM: CT ANGIOGRAPHY CHEST WITH CONTRAST  TECHNIQUE: Multidetector CT imaging of the chest was performed using the standard protocol during bolus administration of intravenous contrast. Multiplanar CT image reconstructions including MIPs were obtained to evaluate the vascular anatomy.  CONTRAST:  1106mL OMNIPAQUE IOHEXOL 350 MG/ML SOLN  COMPARISON:  Chest radiograph on 03/12/13  FINDINGS: Satisfactory opacification of pulmonary arteries noted, and no pulmonary emboli identified. No evidence of thoracic aortic dissection or aneurysm. No evidence of mediastinal hematoma or mass. A large hiatal hernia is seen. No evidence of lymphadenopathy.  Moderate size bilateral pleural  effusions are seen with compressive atelectasis in both lower lobes. Tiny pericardial effusion also noted. Diffuse interstitial thickening and ground-glass pulmonary opacity seen bilaterally, suspicious for mild pulmonary edema.  Review of the MIP images confirms the above findings.  IMPRESSION: No evidence of pulmonary embolism.  Findings consistent with mild congestive heart failure. Moderate bilateral pleural effusions and lower lobe compressive atelectasis.  Large hiatal hernia.   Electronically Signed   By: Earle Gell M.D.   On: 03/12/2013 12:27   Dg Chest Port 1 View  03/12/2013   CLINICAL DATA:  Syncope. Shortness of breath. Decreased breath sounds in left lung base.  EXAM: PORTABLE CHEST - 1 VIEW  COMPARISON:  03/03/2013  FINDINGS: Cardiomegaly and prior CABG again noted. Pulmonary vascular congestion again demonstrated, however there is no evidence of frank pulmonary edema.  There is increased opacity in the medial left retrocardiac lung base, and left lower lobe pneumonia cannot be excluded.  IMPRESSION: Increased opacity in medial left retrocardiac lung base. Cannot exclude left lower lobe pneumonia. Follow-up with two view chest radiograph recommended.  Stable cardiomegaly and pulmonary vascular congestion.   Electronically Signed   By: Earle Gell M.D.   On: 03/12/2013 10:31    Scheduled Meds: . amiodarone  200 mg Oral BID PC  . aspirin EC  81 mg Oral Daily  . atorvastatin  10 mg Oral q1800  . cholecalciferol  2,000 Units Oral Daily  . docusate sodium  100 mg Oral BID  . feeding supplement (ENSURE)  1 Container Oral BID BM  . furosemide  20 mg Intravenous Daily  . levothyroxine  50 mcg Oral QAC breakfast  . metoprolol tartrate  12.5 mg Oral BID  . omega-3 acid ethyl esters  1 g Oral q morning - 10a  . sodium chloride  3 mL Intravenous Q12H  . sodium chloride  3 mL Intravenous Q12H  . vitamin C  500 mg Oral q morning - 10a  . warfarin  4 mg Oral ONCE-1800  . Warfarin - Pharmacist Dosing Inpatient   Does not apply q1800   Continuous Infusions:   Principal Problem:   Pre-syncope Active Problems:   ADENOCARCINOMA, PROSTATE, GLEASON GRADE 3   HYPOTHYROIDISM   HYPERLIPIDEMIA   HYPERTENSION   PAF- pre and post op, now on Amiodarone   S/P mitral valve repair, maze procedure, and CABG x2 02/29/12   CAD - LIMA-LAD, SVG-OM Feb 29 2012   CHF (congestive heart failure)   Anemia- post op   Atrial fibrillation- back in AF with CVR   Hypotension, unspecified   Pleural effusion, bilateral   Delirium    Time spent: 35 minutes    Kelvin Cellar  [facility] Pager [phone]. If 7PM-7AM, please contact night-coverage at [email or web address], password [facility] 03/13/2013, 4:58 PM  LOS: 1 day

## 2013-03-13 NOTE — Progress Notes (Signed)
  Echocardiogram 2D Echocardiogram has been performed.  Gaius Ishaq FRANCES 03/13/2013, 3:34 PM

## 2013-03-14 ENCOUNTER — Other Ambulatory Visit: Payer: Self-pay | Admitting: *Deleted

## 2013-03-14 LAB — PROTIME-INR
INR: 1.64 — ABNORMAL HIGH (ref 0.00–1.49)
Prothrombin Time: 19 seconds — ABNORMAL HIGH (ref 11.6–15.2)

## 2013-03-14 LAB — BASIC METABOLIC PANEL
BUN: 12 mg/dL (ref 6–23)
CO2: 20 mEq/L (ref 19–32)
Calcium: 8.5 mg/dL (ref 8.4–10.5)
Chloride: 103 mEq/L (ref 96–112)
Creatinine, Ser: 0.86 mg/dL (ref 0.50–1.35)
GFR, EST NON AFRICAN AMERICAN: 78 mL/min — AB (ref >90)
Glucose, Bld: 106 mg/dL — ABNORMAL HIGH (ref 70–99)
Potassium: 4 mEq/L (ref 3.7–5.3)
Sodium: 138 mEq/L (ref 137–147)

## 2013-03-14 LAB — URINE CULTURE
Colony Count: NO GROWTH
Culture: NO GROWTH

## 2013-03-14 LAB — CBC
HCT: 29.5 % — ABNORMAL LOW (ref 39.0–52.0)
HEMOGLOBIN: 9.8 g/dL — AB (ref 13.0–17.0)
MCH: 30.8 pg (ref 26.0–34.0)
MCHC: 33.2 g/dL (ref 30.0–36.0)
MCV: 92.8 fL (ref 78.0–100.0)
Platelets: 345 10*3/uL (ref 150–400)
RBC: 3.18 MIL/uL — ABNORMAL LOW (ref 4.22–5.81)
RDW: 13.6 % (ref 11.5–15.5)
WBC: 9.9 10*3/uL (ref 4.0–10.5)

## 2013-03-14 MED ORDER — FUROSEMIDE 40 MG PO TABS: 40.0000 mg | ORAL_TABLET | Freq: Two times a day (BID) | ORAL | Status: DC

## 2013-03-14 MED ORDER — WARFARIN SODIUM 6 MG PO TABS
6.0000 mg | ORAL_TABLET | Freq: Once | ORAL | Status: DC
Start: 2013-03-14 — End: 2013-03-14
  Filled 2013-03-14: qty 1

## 2013-03-14 MED ORDER — WARFARIN SODIUM 2.5 MG PO TABS: 2.5000 mg | ORAL_TABLET | Freq: Every day | ORAL | Status: DC

## 2013-03-14 MED ORDER — ASPIRIN 81 MG PO TABS: 81.0000 mg | ORAL_TABLET | Freq: Every day | ORAL | Status: DC

## 2013-03-14 MED ORDER — ENSURE PUDDING PO PUDG: 1.0000 | Freq: Two times a day (BID) | ORAL | Status: DC

## 2013-03-14 MED ORDER — FUROSEMIDE 40 MG PO TABS
40.0000 mg | ORAL_TABLET | Freq: Two times a day (BID) | ORAL | Status: DC
Start: 2013-03-14 — End: 2013-03-14
  Administered 2013-03-14: 40 mg via ORAL
  Filled 2013-03-14 (×3): qty 1

## 2013-03-14 MED ORDER — TRAMADOL HCL 50 MG PO TABS: 50.0000 mg | ORAL_TABLET | Freq: Four times a day (QID) | ORAL | Status: DC | PRN

## 2013-03-14 MED ORDER — POTASSIUM CHLORIDE ER 10 MEQ PO TBCR: 20.0000 meq | EXTENDED_RELEASE_TABLET | Freq: Every day | ORAL | Status: DC

## 2013-03-14 NOTE — Progress Notes (Signed)
Subjective:  Better today. Walking. No SOB. AFIB converted to NSR with 1st degree AVB. Excellent.   Output about 1Liter.   Objective:  Vital Signs in the last 24 hours: Temp:  [98 F (36.7 C)-99.7 F (37.6 C)] 98 F (36.7 C) (01/21 0609) Pulse Rate:  [72-133] 85 (01/21 0609) Resp:  [18-20] 18 (01/21 0609) BP: (91-140)/(50-88) 127/51 mmHg (01/21 0609) SpO2:  [96 %-100 %] 96 % (01/21 0609) Weight:  [181 lb (82.1 kg)] 181 lb (82.1 kg) (01/21 0609)  Intake/Output from previous day: 01/20 0701 - 01/21 0700 In: 1080 [P.O.:1080] Out: 1000 [Urine:1000]   Physical Exam: General: Well developed, well nourished, in no acute distress. Head:  Normocephalic and atraumatic. Lungs: Clear to auscultation and percussion. Heart: Normal S1 and S2. Occasional ectopy.  No murmur, rubs or gallops.  Abdomen: soft, non-tender, positive bowel sounds. Extremities: No clubbing or cyanosis. No edema. Neurologic: Alert in NAD    Lab Results:  Recent Labs  03/13/13 0440 03/14/13 0448  WBC 10.3 9.9  HGB 9.7* 9.8*  PLT 375 345    Recent Labs  03/13/13 0440 03/14/13 0448  NA 137 138  K 3.9 4.0  CL 100 103  CO2 23 20  GLUCOSE 114* 106*  BUN 12 12  CREATININE 0.90 0.86    Recent Labs  03/12/13 0945  TROPONINI <0.30   Hepatic Function Panel  Recent Labs  03/13/13 0440  PROT 6.4  ALBUMIN 2.9*  AST 36  ALT 54*  ALKPHOS 93  BILITOT 0.7   Imaging: Ct Head Wo Contrast  03/12/2013   CLINICAL DATA:  Dizziness and confusion.  EXAM: CT HEAD WITHOUT CONTRAST  TECHNIQUE: Contiguous axial images were obtained from the base of the skull through the vertex without intravenous contrast.  COMPARISON:  Head CT scan 05/25/2012.  FINDINGS: The brain is atrophic with chronic microvascular ischemic change. No evidence of acute abnormality including infarction, hemorrhage, mass lesion, mass effect, midline shift or abnormal extra-axial fluid collection is identified. Extensive atherosclerotic  vascular disease is again seen. The calvarium is intact. There is no hydrocephalus or pneumocephalus.  IMPRESSION: No acute finding.  Atrophy, chronic microvascular ischemic change and atherosclerosis.   Electronically Signed   By: Inge Rise M.D.   On: 03/12/2013 19:29   Ct Angio Chest Pe W/cm &/or Wo Cm  03/12/2013   CLINICAL DATA:  Near syncope. Shortness of breath. Chest pain. Clinical suspicion for pulmonary embolism.  EXAM: CT ANGIOGRAPHY CHEST WITH CONTRAST  TECHNIQUE: Multidetector CT imaging of the chest was performed using the standard protocol during bolus administration of intravenous contrast. Multiplanar CT image reconstructions including MIPs were obtained to evaluate the vascular anatomy.  CONTRAST:  147mL OMNIPAQUE IOHEXOL 350 MG/ML SOLN  COMPARISON:  Chest radiograph on 03/12/13  FINDINGS: Satisfactory opacification of pulmonary arteries noted, and no pulmonary emboli identified. No evidence of thoracic aortic dissection or aneurysm. No evidence of mediastinal hematoma or mass. A large hiatal hernia is seen. No evidence of lymphadenopathy.  Moderate size bilateral pleural effusions are seen with compressive atelectasis in both lower lobes. Tiny pericardial effusion also noted. Diffuse interstitial thickening and ground-glass pulmonary opacity seen bilaterally, suspicious for mild pulmonary edema.  Review of the MIP images confirms the above findings.  IMPRESSION: No evidence of pulmonary embolism.  Findings consistent with mild congestive heart failure. Moderate bilateral pleural effusions and lower lobe compressive atelectasis.  Large hiatal hernia.   Electronically Signed   By: Earle Gell M.D.   On:  03/12/2013 12:27   Dg Chest Port 1 View  03/12/2013   CLINICAL DATA:  Syncope. Shortness of breath. Decreased breath sounds in left lung base.  EXAM: PORTABLE CHEST - 1 VIEW  COMPARISON:  03/03/2013  FINDINGS: Cardiomegaly and prior CABG again noted. Pulmonary vascular congestion again  demonstrated, however there is no evidence of frank pulmonary edema. There is increased opacity in the medial left retrocardiac lung base, and left lower lobe pneumonia cannot be excluded.  IMPRESSION: Increased opacity in medial left retrocardiac lung base. Cannot exclude left lower lobe pneumonia. Follow-up with two view chest radiograph recommended.  Stable cardiomegaly and pulmonary vascular congestion.   Electronically Signed   By: Earle Gell M.D.   On: 03/12/2013 10:31   Personally viewed.   Telemetry: No longer afib. NSR. PVC's Personally viewed.   ECHO 03/13/13:   - Left ventricle: The cavity size was normal. There was mild concentric hypertrophy. Systolic function was normal. The estimated ejection fraction was in the range of 55% to 60%. Wall motion was normal; there were no regional wall motion abnormalities. The study is not technically sufficient to allow evaluation of LV diastolic function. - Aortic valve: Mild regurgitation. - Mitral valve: The findings are consistent with mild stenosis. Valve area by continuity equation (using LVOT flow): 1.66cm^2. Post repair.  - Left atrium: The atrium was moderately dilated. - Right ventricle: The cavity size was moderately dilated. Wall thickness was normal. - Right atrium: The atrium was mildly dilated. - Pulmonary arteries: PA peak pressure: 51mm Hg (S).   Assessment/Plan:   80 year old post op MV repair and MAZE admitted with PAF and delirium.   1) AFIB - now NSR. Continue with amiodarone 200mg  BID as well as metoprolol 12.5 BID. Post Maze. Hold ACE-I.   2) Delerium - seems improved. Per primary team.  3) Pleural effusions - gentle diuresis. On lasix 20mg  IV. Will change to 40mg  PO BID to continue with further diuresis. Creat 0.86. Will need BMET monitored. K 4.0  4) CAD - post CABG - stable. No angina.   5) Chronic anticoagulation - subtheraputic. 1.64. Per pharmacy.   6) Chronic diastolic HF - as above.   SNF at  discharge. PT. Should be ready today or tomorrow.   Brynlynn Walko, [place] 03/14/2013, 9:33 AM

## 2013-03-14 NOTE — Progress Notes (Signed)
Report called to [facility] and report given to [place], [place]. SBAR and Kardex used to give report. Opportunity to ask questions was given. Pt is being transported to [place] by his family

## 2013-03-14 NOTE — Progress Notes (Addendum)
Clinical Social Worker facilitated patient discharge by contacting the patient, family and facility, [facility]. Patient agreeable to this plan and arranging transport via family . CSW spoke to facility, which stated they have insurance auth. CSW also called pt's daughter to inform her of insurance approval. Daughter was please and stated she is going to pick up patient to bring him to the facility. CSW will sign off, as social work intervention is no longer needed.  Jeanette Caprice, MSW, [place]

## 2013-03-14 NOTE — Progress Notes (Signed)
CARDIAC REHAB PHASE I   PRE:  Rate/Rhythm: 86 afib  BP:  Supine:   Sitting: 94/62  Standing:    SaO2: 97%RA  MODE:  Ambulation: 750 ft   POST:  Rate/Rhythm: 91  BP:  Supine:   Sitting: 112/70  Standing:    SaO2: 97%RA 1145-1205 Pt walked 750 ft on RA after shoes put on and using rolling walker. Asst. x1 . Tolerated increase in distance well. Family in room. To recliner after walk and lunch set up.   Graylon Good, RN BSN  03/14/2013 12:02 PM

## 2013-03-14 NOTE — Progress Notes (Signed)
Clinical Social Work Department CLINICAL SOCIAL WORK PLACEMENT NOTE 03/14/2013  Patient:  [name], [name]  Account Number:  [id number] Admit date:  03/12/2013  Clinical Social Worker:  Megan Salon  Date/time:  03/13/2013 03:03 PM  Clinical Social Work is seeking post-discharge placement for this patient at the following level of care:   [place]   (*CSW will update this form in Epic as items are completed)   03/13/2013  Patient/family provided with [place] Department of Clinical Social Work's list of facilities offering this level of care within the geographic area requested by the patient (or if unable, by the patient's family).  03/13/2013  Patient/family informed of their freedom to choose among providers that offer the needed level of care, that participate in Medicare, Medicaid or managed care program needed by the patient, have an available bed and are willing to accept the patient.    Patient/family informed of [facility]' ownership interest in [facility], as well as of the fact that they are under no obligation to receive care at this facility.  PASARR submitted to EDS on 03/13/2013 PASARR number received from EDS on 03/13/2013  FL2 transmitted to all facilities in geographic area requested by pt/family on  03/13/2013 FL2 transmitted to all facilities within larger geographic area on   Patient informed that his/her managed care company has contracts with or will negotiate with  certain facilities, including the following:     Patient/family informed of bed offers received:  03/14/2013 Patient chooses bed at [facility] [place] Physician recommends and patient chooses bed at    Patient to be transferred to [facility] [place] on  03/14/2013 Patient to be transferred to facility by [facility]  The following physician request were entered in Epic:   Additional Comments:  Jeanette Caprice, MSW, [place]

## 2013-03-14 NOTE — Progress Notes (Signed)
OT Cancellation Note  Patient Details Name: [name]. MRN: [id number] DOB: [birth year]   Cancelled Treatment:    Reason Eval/Treat Not Completed: OT screened, no needs identified, will sign off Pt transferring to SNF this pm  Lucille Passy, OTR/L [phone]  03/14/2013, 2:50 PM

## 2013-03-14 NOTE — Progress Notes (Signed)
[facility] for Coumadin Indication: afib, MV repair  No Known Allergies  Labs:  Recent Labs  03/12/13 0945 03/12/13 1222 03/13/13 0440 03/14/13 0448  HGB 10.5*  --  9.7* 9.8*  HCT 31.7*  --  28.8* 29.5*  PLT 352  --  375 345  APTT  --  36  --   --   LABPROT  --  19.4* 21.8* 19.0*  INR  --  1.69* 1.97* 1.64*  CREATININE 0.93  --  0.90 0.86  TROPONINI <0.30  --   --   --     Estimated Creatinine Clearance: 65.1 ml/min (by C-G formula based on Cr of 0.86).  Assessment: 80 yo male on chronic Coumadin for afib and MV repair admitted with failure to thrive,  SOB, and mild congestion from CHF.  S/p CABG x 2, mitral valve repair and MAZE 02/28/13.  INR = 1.64 (dose not charted 1/19)  Goal of Therapy:  INR 2-3 Monitor platelets by anticoagulation protocol: Yes   Plan:  1. Coumadin 6 mg po x 1 tonight. 2. Daily PT/INR.  Thank you. Anette Guarneri, PharmD [phone]   03/14/2013 9:07 AM

## 2013-03-14 NOTE — Discharge Summary (Signed)
PATIENT DETAILS Name: [name]. Age: 80 y.o. Sex: male Date of Birth: 1927-05-12 MRN: [id number]. Admit Date: 03/12/2013 Admitting Physician: Jonetta Osgood, MD PCP:FRY,STEPHEN A, MD  Recommendations for Outpatient Follow-up:  1.  Coumadin 6 mg on 03/14/13, and then from 03/15/13 please dose Coumadin at 2.5 mg daily. Please check a INR and next 2-3 days. 2. Started on Lasix this admission, please check electrolytes in the next 2-3 days.  PRIMARY DISCHARGE DIAGNOSIS:  Principal Problem:   Pre-syncope Active Problems:   ADENOCARCINOMA, PROSTATE, GLEASON GRADE 3   HYPOTHYROIDISM   HYPERLIPIDEMIA   HYPERTENSION   PAF- pre and post op, now on Amiodarone   S/P mitral valve repair, maze procedure, and CABG x2 02/29/12   CAD - LIMA-LAD, SVG-OM Feb 29 2012   CHF (congestive heart failure)   Anemia- post op   Atrial fibrillation- back in AF with CVR   Hypotension, unspecified   Pleural effusion, bilateral   Delirium      PAST MEDICAL HISTORY: Past Medical History  Diagnosis Date  . Personal history of colonic polyps     tubular adenoma  . Hypertension   . Irritable bowel syndrome   . Prostate cancer 1991    sees Dr. Carlota Raspberry at [place], observation only   . Atrial fibrillation   . S/P mitral valve repair, maze procedure, and CABG x2 02/28/2013    Complex valvuloplasty including triangular resection of posterior leaflet, 30 mm Sorin Memo 3D ring annuloplasty  . S/P CABG x 2 02/28/2013    LIMA to LAD, SVG to OM1, EVH via right thigh  . S/P Maze operation for atrial fibrillation 02/28/2013    Complete bilateral atrial lesion set using bipolar radiofrequency and cryothermy ablation with clipping of LA appendage  . Pleural effusion, bilateral 03/12/2013    Small to moderate, L>R  . Complication of anesthesia     "serious cognisance problems since my OR in January" (03/13/2013  . Coronary artery disease     DISCHARGE MEDICATIONS:   Medication List    STOP taking these  medications       lisinopril 2.5 MG tablet  Commonly known as:  PRINIVIL,ZESTRIL      TAKE these medications       amiodarone 200 MG tablet  Commonly known as:  PACERONE  Take 1 tablet (200 mg total) by mouth 2 (two) times daily after a meal. For 1 week, then decrease to 200 mg daily     aspirin 81 MG tablet  Take 1 tablet (81 mg total) by mouth daily.     atorvastatin 10 MG tablet  Commonly known as:  LIPITOR  Take 10 mg by mouth daily. At 6pm     feeding supplement (ENSURE) Pudg  Take 1 Container by mouth 2 (two) times daily between meals.     fish oil-omega-3 fatty acids 1000 MG capsule  Take 1 g by mouth every morning.     furosemide 40 MG tablet  Commonly known as:  LASIX  Take 1 tablet (40 mg total) by mouth 2 (two) times daily.     levothyroxine 50 MCG tablet  Commonly known as:  SYNTHROID, LEVOTHROID  Take 1 tablet (50 mcg total) by mouth daily.     metoprolol tartrate 25 MG tablet  Commonly known as:  LOPRESSOR  Take 12.5 mg by mouth 2 (two) times daily.     potassium chloride 10 MEQ tablet  Commonly known as:  K-DUR  Take 2 tablets (20 mEq  total) by mouth daily.     traMADol 50 MG tablet  Commonly known as:  ULTRAM  Take 1 tablet (50 mg total) by mouth every 6 (six) hours as needed for moderate pain.     vitamin C 500 MG tablet  Commonly known as:  ASCORBIC ACID  Take 500 mg by mouth every morning.     Vitamin D 1000 UNITS capsule  Take 2,000 Units by mouth every morning.     warfarin 2.5 MG tablet  Commonly known as:  COUMADIN  Take 1 tablet (2.5 mg total) by mouth daily at 6 PM. Dose 6 mg on 03/14/13, and then usual home dose of 2.5 mg 03/15/13.        ALLERGIES:  No Known Allergies  BRIEF HPI:  See H&P, Labs, Consult and Test reports for all details in brief,is a 80 y.o. male, with a PMH of Mitral value repair, CABG, and Maze procedure on 01.07.15, a fib, HTN, IBS, and prostate CA. He is presenting today to the ED with pre syncope that  occurred on the morning of his admit day while having a BM.Pt and his family also admit to pt having visual hallucinations and progressive weakness which began after d/c from hospital after recent surgery.  CONSULTATIONS:   cardiology and Cardiothoracic surgery  PERTINENT RADIOLOGIC STUDIES: Dg Chest 2 View  03/03/2013   CLINICAL DATA:  Atelectasis.  EXAM: CHEST  2 VIEW  COMPARISON:  Plain film of the chest 03/02/2013.  FINDINGS: Right IJ sheath has been removed. The patient is status post CABG. There are small bilateral pleural effusions and basilar atelectasis. Interstitial edema seen on the prior study is resolved. Hiatal hernia is noted.  IMPRESSION: Small bilateral pleural effusions and basilar atelectasis.  Resolved interstitial edema.  Hiatal hernia.   Electronically Signed   By: Inge Rise M.D.   On: 03/03/2013 04:28   Dg Chest 2 View  02/27/2013   CLINICAL DATA:  Shortness of breath  EXAM: CHEST  2 VIEW  COMPARISON:  02/25/2013  FINDINGS: Cardiac shadow remains enlarged. A large hiatal hernia is again identified. The lungs are well aerated bilaterally. No focal infiltrate is seen. No acute bony abnormality is noted. An old left clavicular fracture is again noted.  IMPRESSION: No acute abnormality seen.   Electronically Signed   By: Inez Catalina M.D.   On: 02/27/2013 16:18   Ct Head Wo Contrast  03/12/2013   CLINICAL DATA:  Dizziness and confusion.  EXAM: CT HEAD WITHOUT CONTRAST  TECHNIQUE: Contiguous axial images were obtained from the base of the skull through the vertex without intravenous contrast.  COMPARISON:  Head CT scan 05/25/2012.  FINDINGS: The brain is atrophic with chronic microvascular ischemic change. No evidence of acute abnormality including infarction, hemorrhage, mass lesion, mass effect, midline shift or abnormal extra-axial fluid collection is identified. Extensive atherosclerotic vascular disease is again seen. The calvarium is intact. There is no hydrocephalus or  pneumocephalus.  IMPRESSION: No acute finding.  Atrophy, chronic microvascular ischemic change and atherosclerosis.   Electronically Signed   By: Inge Rise M.D.   On: 03/12/2013 19:29   Ct Angio Chest Pe W/cm &/or Wo Cm  03/12/2013   CLINICAL DATA:  Near syncope. Shortness of breath. Chest pain. Clinical suspicion for pulmonary embolism.  EXAM: CT ANGIOGRAPHY CHEST WITH CONTRAST  TECHNIQUE: Multidetector CT imaging of the chest was performed using the standard protocol during bolus administration of intravenous contrast. Multiplanar CT image reconstructions including MIPs were obtained  to evaluate the vascular anatomy.  CONTRAST:  162mL OMNIPAQUE IOHEXOL 350 MG/ML SOLN  COMPARISON:  Chest radiograph on 03/12/13  FINDINGS: Satisfactory opacification of pulmonary arteries noted, and no pulmonary emboli identified. No evidence of thoracic aortic dissection or aneurysm. No evidence of mediastinal hematoma or mass. A large hiatal hernia is seen. No evidence of lymphadenopathy.  Moderate size bilateral pleural effusions are seen with compressive atelectasis in both lower lobes. Tiny pericardial effusion also noted. Diffuse interstitial thickening and ground-glass pulmonary opacity seen bilaterally, suspicious for mild pulmonary edema.  Review of the MIP images confirms the above findings.  IMPRESSION: No evidence of pulmonary embolism.  Findings consistent with mild congestive heart failure. Moderate bilateral pleural effusions and lower lobe compressive atelectasis.  Large hiatal hernia.   Electronically Signed   By: Earle Gell M.D.   On: 03/12/2013 12:27   Dg Chest Port 1 View  03/12/2013   CLINICAL DATA:  Syncope. Shortness of breath. Decreased breath sounds in left lung base.  EXAM: PORTABLE CHEST - 1 VIEW  COMPARISON:  03/03/2013  FINDINGS: Cardiomegaly and prior CABG again noted. Pulmonary vascular congestion again demonstrated, however there is no evidence of frank pulmonary edema. There is increased  opacity in the medial left retrocardiac lung base, and left lower lobe pneumonia cannot be excluded.  IMPRESSION: Increased opacity in medial left retrocardiac lung base. Cannot exclude left lower lobe pneumonia. Follow-up with two view chest radiograph recommended.  Stable cardiomegaly and pulmonary vascular congestion.   Electronically Signed   By: Earle Gell M.D.   On: 03/12/2013 10:31   Dg Chest Port 1 View  03/02/2013   CLINICAL DATA:  Postop for median sternotomy.  Hypertension.  EXAM: PORTABLE CHEST - 1 VIEW  COMPARISON:  DG CHEST 1V PORT dated 03/01/2013; DG CHEST 1V PORT dated 02/28/2013  FINDINGS: Removal of endotracheal and nasogastric tubes. Swan-Ganz catheter removal with right IJ Cordis sheath remaining in place. The sheath is likely kinked at the skin surface. Median sternotomy with left atrial appendage occlusion device. Removal of mediastinal drain . A left-sided chest tube remains in place.  Cardiomegaly accentuated by AP portable technique. Probable small bilateral pleural effusions. No pneumothorax. Diminished lung volumes. This accentuates the pulmonary interstitium. Question mild superimposed pulmonary venous congestion. Increased right and similar left base airspace disease.  IMPRESSION: Extubation. Decreased lung volumes with suspicion of developing pulmonary venous congestion.  Increased right and similar left base Airspace disease, likely atelectasis.  Probable small bilateral pleural effusions, but no pneumothorax.   Electronically Signed   By: Abigail Miyamoto M.D.   On: 03/02/2013 07:32   Dg Chest Portable 1 View In Am  03/01/2013   CLINICAL DATA:  Status post CABG. Coronary artery disease and atrial fibrillation. Hypertension.  EXAM: PORTABLE CHEST - 1 VIEW  COMPARISON:  DG CHEST 1V PORT dated 02/28/2013; DG CHEST 2 VIEW dated 02/27/2013  FINDINGS: Endotracheal tube terminates at the level of the clavicles, 6.2 cm above carina. Nasogastric tube extends beyond the inferior aspect of the film.   A right IJ Swan-Ganz catheter is unchanged in position. The tip is likely within the proximal right pulmonary artery or pulmonary outflow tract.  Mediastinal drain and at least 1 left-sided chest tube. Left atrial occlusion device.  Cardiomegaly accentuated by AP portable technique. Probable small left pleural effusion. No pneumothorax. Low lung volumes with resultant pulmonary interstitial prominence. No change in left base airspace disease/ atelectasis.  IMPRESSION: Endotracheal tube borderline high in position, 6.2 cm above carina.  Consider advancement or attention on follow-up.  Otherwise, expected appearance after median sternotomy with left base atelectasis and possible small left pleural effusion.  No pneumothorax.   Electronically Signed   By: Abigail Miyamoto M.D.   On: 03/01/2013 07:53   Dg Chest Portable 1 View  02/28/2013   CLINICAL DATA:  Postoperative from CABG  EXAM: PORTABLE CHEST - 1 VIEW  COMPARISON:  PA and lateral chest dated February 27, 2013  FINDINGS: The lungs are slightly less well inflated today. The interstitial markings are not dramatically changed. The cardiopericardial silhouette is mildly enlarged. The pulmonary vascularity is minimally prominent centrally. There is no evidence of a pneumothorax nor significant pleural effusion.  The endotracheal tube tip lies approximately 6.5 cm above the crotch of the carina. The Swan-Ganz type catheter tip lies fairly centrally likely in the proximal right main pulmonary artery. The esophagogastric tube tip projects at the level of the GE junction and advancement is needed. The left lower chest tube tip lies in the region of the medial costophrenic angle. A mediastinal drain tip projects at approximately the T7 level. A left atrial appendage clip is demonstrated.  IMPRESSION: 1. The appearance of the mediastinal structures and lung parenchyma is consistent with the post CABG state. Very mild interstitial edema and subsegmental atelectasis is noted on  the left. 2. The support tubes and lines appear to be appropriately positioned with the exception of the esophagogastric tube which needs to be advanced by at least 20 cm.   Electronically Signed   By: David  Martinique   On: 02/28/2013 16:49   Dg Chest Portable 1 View  02/25/2013   CLINICAL DATA:  Chest pain  EXAM: PORTABLE CHEST - 1 VIEW  COMPARISON:  DG CHEST 2 VIEW dated 05/25/2012; DG CHEST 2 VIEW dated 03/03/2012; DG CHEST 2 VIEW dated 12/06/2010  FINDINGS: There is stable mild cardiomegaly. Atherosclerotic calcification thoracic aortic arch is noted. Lung volumes are slightly low. Interstitial markings are mildly prominent, and unchanged compared to prior studies. Negative for edema, focal airspace disease, or visible pleural effusion. Negative for pneumothorax. A large hiatal hernia contour is similar to priors. Remote healed left clavicle fracture noted. No acute osseous abnormality.  IMPRESSION: 1. Low lung volumes with chronic mild interstitial prominence. No definite acute findings. 2. Stable cardiomegaly. 3. Stable large hiatal hernia. 4. Atherosclerosis.   Electronically Signed   By: Curlene Dolphin M.D.   On: 02/25/2013 22:06     PERTINENT LAB RESULTS: CBC:  Recent Labs  03/13/13 0440 03/14/13 0448  WBC 10.3 9.9  HGB 9.7* 9.8*  HCT 28.8* 29.5*  PLT 375 345   CMET CMP     Component Value Date/Time   NA 138 03/14/2013 0448   K 4.0 03/14/2013 0448   CL 103 03/14/2013 0448   CO2 20 03/14/2013 0448   GLUCOSE 106* 03/14/2013 0448   BUN 12 03/14/2013 0448   CREATININE 0.86 03/14/2013 0448   CALCIUM 8.5 03/14/2013 0448   PROT 6.4 03/13/2013 0440   ALBUMIN 2.9* 03/13/2013 0440   AST 36 03/13/2013 0440   ALT 54* 03/13/2013 0440   ALKPHOS 93 03/13/2013 0440   BILITOT 0.7 03/13/2013 0440   GFRNONAA 78* 03/14/2013 0448   GFRAA >90 03/14/2013 0448    GFR Estimated Creatinine Clearance: 65.1 ml/min (by C-G formula based on Cr of 0.86). No results found for this basename: LIPASE, AMYLASE,  in the  last 72 hours  Recent Labs  03/12/13 0945  TROPONINI <0.30  No components found with this basename: POCBNP,  No results found for this basename: DDIMER,  in the last 72 hours No results found for this basename: HGBA1C,  in the last 72 hours No results found for this basename: CHOL, HDL, LDLCALC, TRIG, CHOLHDL, LDLDIRECT,  in the last 72 hours  Recent Labs  03/13/13 0440  TSH 2.827    Recent Labs  03/13/13 0440  VITAMINB12 1336*   Coags:  Recent Labs  03/13/13 0440 03/14/13 0448  INR 1.97* 1.64*   Microbiology: Recent Results (from the past 240 hour(s))  CULTURE, BLOOD (ROUTINE X 2)     Status: None   Collection Time    03/12/13 12:25 PM      Result Value Range Status   Specimen Description BLOOD RIGHT ANTECUBITAL   Final   Special Requests BOTTLES DRAWN AEROBIC AND ANAEROBIC 10MLS   Final   Culture  Setup Time     Final   Value: 03/12/2013 16:07     Performed at [facility]   Culture     Final   Value:        BLOOD CULTURE RECEIVED NO GROWTH TO DATE CULTURE WILL BE HELD FOR 5 DAYS BEFORE ISSUING A FINAL NEGATIVE REPORT     Performed at [facility]   Report Status PENDING   Incomplete  CULTURE, BLOOD (ROUTINE X 2)     Status: None   Collection Time    03/12/13 12:30 PM      Result Value Range Status   Specimen Description BLOOD RIGHT HAND   Final   Special Requests BOTTLES DRAWN AEROBIC AND ANAEROBIC 10MLS   Final   Culture  Setup Time     Final   Value: 03/12/2013 16:07     Performed at [facility]   Culture     Final   Value:        BLOOD CULTURE RECEIVED NO GROWTH TO DATE CULTURE WILL BE HELD FOR 5 DAYS BEFORE ISSUING A FINAL NEGATIVE REPORT     Performed at [facility]   Report Status PENDING   Incomplete  URINE CULTURE     Status: None   Collection Time    03/13/13  8:29 AM      Result Value Range Status   Specimen Description URINE, RANDOM   Final   Special Requests NONE   Final   Culture  Setup Time     Final    Value: 03/13/2013 13:33     Performed at [place]     Final   Value: NO GROWTH     Performed at [facility]   Culture     Final   Value: NO GROWTH     Performed at [facility]   Report Status 03/14/2013 FINAL   Final     BRIEF HOSPITAL COURSE:  Functional decline/failure to thrive -suspect patient having a history of underlying cognitive impairment, recent coronary artery bypass grafting/hospitalization contributing to development of delirium. CT scan of brain performed on admission showed no acute intracranial abnormalities. Lab work unremarkable as he does not appear to have an obvious source of infection, blood and urine cultures were negative as well TSH within normal limits, white count at 10,300. Physical therapy/occupational therapy consulted, current recommendations are to go to SNF. Family agreeable, currently stable for discharge.  Acute on chronic diastolic heart failure - Admitted and started on Lasix. Significantly better with diuresis. -  With transition to oral Lasix at 40 mg by mouth twice a day on discharge. - Has follow up appointment with cardiology already scheduled (see below) - Since started on Lasix this admission, please check chemistries in the next 3-4 days  Bilateral pleural effusion - Secondary to underlying chronic diastolic heart failure, recent CABG - Gentle diuresis as above. Please check chest x-ray at follow up with either cardiology or cardiothoracic surgery  Presyncopal episode -? Secondary to vasovagal-from straining during bowel movements. We have also been secondary to mild hypotension that was documented on admission. ACE inhibitor was discontinued. - Monitored on telemetry, echocardiogram done did not show any significant abnormalities. Known history of A. fib, maintained on amiodarone and Coumadin. No further workup currently recommended by cardiology.  History of coronary artery disease- status  post recent CABG - Seen by cardiothoracic surgery this admission, no further recommendations, stable to discharge to SNF. - Patient has scheduled appointment with cardiothoracic surgery-see below  History of recent mitral valve repair - Echocardiogram did not show any major abnormalities. Seen by both cards and cardiothoracic surgery this admission. No further recommendations. Has scheduled outpatient followup with both cardiology and cardiothoracic surgery (see below)  History of atrial fibrillation with recent maze procedure during CABG and mitral valve repair - Unfortunately continues to be in A. fib-rate controlled with metoprolol, amiodarone. Anticoagulated with Coumadin. INR subtherapeutic at the time of discharge, we will increase Coumadin to 6 mg on 1/21, from 1/22, resume home dose of 2.5 mg.  Delirium - Likely postoperative delirium. - Resolving with supportive care - As noted above, no indication of any infection or electrolyte abnormalities. Blood and urine cultures negative. CT head negative.  Protein calorie malnutrition.  -Provide protein boost 3 times a day  TODAY-DAY OF DISCHARGE:  Subjective:   Lyn Records today has no headache,no chest abdominal pain,no new weakness tingling or numbness, feels much better wants to go home today.   Objective:   Blood pressure 113/64, pulse 87, temperature 98.9 F (37.2 C), temperature source Oral, resp. rate 18, height 5\' 9"  (1.753 m), weight 82.1 kg (181 lb), SpO2 99.00%.  Intake/Output Summary (Last 24 hours) at 03/14/13 1120 Last data filed at 03/14/13 0846  Gross per 24 hour  Intake    960 ml  Output    650 ml  Net    310 ml   Filed Weights   03/12/13 1830 03/13/13 0406 03/14/13 0609  Weight: 86.1 kg (189 lb 13.1 oz) 83.4 kg (183 lb 13.8 oz) 82.1 kg (181 lb)    Exam Awake Alert, Oriented *3, No new F.N deficits, Normal affect [place].AT,PERRAL Supple Neck,No JVD, No cervical lymphadenopathy appriciated.  Symmetrical  Chest wall movement, Good air movement bilaterally, CTAB RRR,No Gallops,Rubs or new Murmurs, No Parasternal Heave +ve B.Sounds, Abd Soft, Non tender, No organomegaly appriciated, No rebound -guarding or rigidity. No Cyanosis, Clubbing or edema, No new Rash or bruise  DISCHARGE CONDITION: Stable  DISPOSITION: SNF  DISCHARGE INSTRUCTIONS:    Activity:  As tolerated with Full fall precautions use walker/cane & assistance as needed  Diet recommendation: Heart Healthy diet Fluid restriction 1.5 lit/day       Discharge Orders   Future Appointments Provider Department Dept Phone   03/23/2013 8:50 AM Sharmon Revere [place] [facility] [phone]   03/26/2013 2:00 PM Rexene Alberts, MD [facility] [phone]   Future Orders Complete By Expires   ([place]) Call MD:  April Manson  you have any of the following symptoms: 1) 3 pound weight gain in 24 hours or 5 pounds in 1 week 2) shortness of breath, with or without a dry hacking cough 3) swelling in the hands, feet or stomach 4) if you have to sleep on extra pillows at night in order to breathe.  As directed    Call MD for:  redness, tenderness, or signs of infection (pain, swelling, redness, odor or green/yellow discharge around incision site)  As directed    Diet - low sodium heart healthy  As directed    Increase activity slowly  As directed       Follow-up Information   Follow up with [name], MD.   Specialty:  Family Medicine   Contact information:   [place] [address] [phone]       Schedule an appointment as soon as possible for a visit to follow up. (After discharge from skilled nursing facility)       Total Time spent on discharge equals 45 minutes.  SignedOren Binet 03/14/2013 11:20 AM

## 2013-03-14 NOTE — Progress Notes (Addendum)
       [address]       [place] [address]             [phone]               Subjective: Feeling much better today.  Walked 3 times yesterday and main complaint was foot pain from plantar fasciitis.  No dizziness or dyspnea.   Objective: Vital signs in last 24 hours: Patient Vitals for the past 24 hrs:  BP Temp Temp src Pulse Resp SpO2 Weight  03/14/13 0609 127/51 mmHg 98 F (36.7 C) Oral 85 18 96 % 181 lb (82.1 kg)  03/13/13 1945 134/88 mmHg 99.7 F (37.6 C) Oral 133 - 99 % -  03/13/13 1735 108/50 mmHg - - 106 - - -  03/13/13 1449 91/56 mmHg - - 98 - - -  03/13/13 1447 111/67 mmHg - - 92 - - -  03/13/13 1443 123/51 mmHg 98.6 F (37 C) Oral 93 18 99 % -  03/13/13 1357 140/80 mmHg 98.5 F (36.9 C) Oral 77 18 100 % -  03/13/13 0956 125/62 mmHg 98.3 F (36.8 C) Oral 72 20 99 % -   Current Weight  03/14/13 181 lb (82.1 kg)     Intake/Output from previous day: 01/20 0701 - 01/21 0700 In: 1080 [P.O.:1080] Out: 1000 [Urine:1000]    PHYSICAL EXAM:  Heart: RRR Lungs: Slightly diminished BS in bases, no crackles Wound: Healing well Extremities: No LE edema    Lab Results: CBC: Recent Labs  03/13/13 0440 03/14/13 0448  WBC 10.3 9.9  HGB 9.7* 9.8*  HCT 28.8* 29.5*  PLT 375 345   BMET:  Recent Labs  03/13/13 0440 03/14/13 0448  NA 137 138  K 3.9 4.0  CL 100 103  CO2 23 20  GLUCOSE 114* 106*  BUN 12 12  CREATININE 0.90 0.86  CALCIUM 8.6 8.5    PT/INR:  Recent Labs  03/14/13 0448  LABPROT 19.0*  INR 1.64*   2D Echo (1/20): Study Conclusions  - Left ventricle: The cavity size was normal. There was mild concentric hypertrophy. Systolic function was normal. The estimated ejection fraction was in the range of 55% to 60%. Wall motion was normal; there were no regional wall motion abnormalities. The study is not technically sufficient to allow evaluation of LV diastolic function. - Aortic valve: Mild regurgitation. -  Mitral valve: The findings are consistent with mild stenosis. Valve area by continuity equation (using LVOT flow): 1.66cm^2. - Left atrium: The atrium was moderately dilated. - Right ventricle: The cavity size was moderately dilated. Wall thickness was normal. - Right atrium: The atrium was mildly dilated. - Pulmonary arteries: PA peak pressure: 62mm Hg (S).      Assessment/Plan: Chronic diastolic CHF- diuresing well, wt down 1 kg overnight.  Continue Lasix. Hypotension- BPs improving.  Continue to hold ACE-I. Postop delirium- alert and oriented, very appropriate this am.  No narcotics. CV- PA, appears to be in SR this am.  Continue current meds. Echo stable. Bilateral pleural effusions- stable, continue diuresis and monitor. Deconditioning- PT/OT, CRPI. Disp- SNF at discharge.   LOS: 2 days    COLLINS,GINA H 03/14/2013  I have seen and examined the patient and agree with the assessment and plan as outlined.  Jenasia Dolinar H 03/14/2013 9:33 AM

## 2013-03-14 NOTE — Progress Notes (Signed)
Patient refuses to wear red fall risk socks. Patient educated on the importance of wearing them. He is unsteady on his feet. Patient is close to nurses station and Bed alarm is on.   Domingo Dimes RN

## 2013-03-14 NOTE — Progress Notes (Signed)
Per 2w Network engineer, patient's daughter wants to see Education officer, museum. Social worker went into the room, daughter was not in the room at the time.  CSW presented bed offers to patient and patient's wife by bedside. Patient stated he would like to pursue [facility]. CSW then called [facility] and confirmed a bed for patient.   Jeanette Caprice, MSW, [place]

## 2013-03-16 ENCOUNTER — Non-Acute Institutional Stay (SKILLED_NURSING_FACILITY): Payer: Medicare HMO | Admitting: Internal Medicine

## 2013-03-16 ENCOUNTER — Encounter: Payer: Self-pay | Admitting: Internal Medicine

## 2013-03-16 DIAGNOSIS — R404 Transient alteration of awareness: Secondary | ICD-10-CM

## 2013-03-16 DIAGNOSIS — E039 Hypothyroidism, unspecified: Secondary | ICD-10-CM

## 2013-03-16 DIAGNOSIS — I509 Heart failure, unspecified: Secondary | ICD-10-CM

## 2013-03-16 DIAGNOSIS — Z7901 Long term (current) use of anticoagulants: Secondary | ICD-10-CM

## 2013-03-16 DIAGNOSIS — J90 Pleural effusion, not elsewhere classified: Secondary | ICD-10-CM

## 2013-03-16 DIAGNOSIS — I10 Essential (primary) hypertension: Secondary | ICD-10-CM

## 2013-03-16 DIAGNOSIS — D649 Anemia, unspecified: Secondary | ICD-10-CM

## 2013-03-16 DIAGNOSIS — Z9889 Other specified postprocedural states: Secondary | ICD-10-CM

## 2013-03-16 DIAGNOSIS — I4891 Unspecified atrial fibrillation: Secondary | ICD-10-CM

## 2013-03-16 DIAGNOSIS — I5032 Chronic diastolic (congestive) heart failure: Secondary | ICD-10-CM

## 2013-03-16 DIAGNOSIS — E785 Hyperlipidemia, unspecified: Secondary | ICD-10-CM

## 2013-03-16 DIAGNOSIS — R410 Disorientation, unspecified: Secondary | ICD-10-CM

## 2013-03-16 NOTE — Assessment & Plan Note (Signed)
TSH - 2.87 three days ago; will continue synthroid 50 ncg

## 2013-03-16 NOTE — Assessment & Plan Note (Signed)
Felt secondary to recent acute on chronic CHF; needs repear DCR at f/u with cards on 1/30.

## 2013-03-16 NOTE — Assessment & Plan Note (Signed)
HDL-37, LDL 102; goal would be <70; pt on Lipitor 10 mg daily; will be rechecked by cards

## 2013-03-16 NOTE — Assessment & Plan Note (Signed)
ACE was d/c because BP too low; now on  Lopressor 12.5 mg BID and Lasix 40 mg BID

## 2013-03-16 NOTE — ED Provider Notes (Signed)
I saw and evaluated the patient, reviewed the resident's note and I agree with the findings and plan.  EKG Interpretation    Date/Time:  Monday March 12 2013 09:11:21 EST Ventricular Rate:  77 PR Interval:    QRS Duration: 95 QT Interval:  385 QTC Calculation: 436 R Axis:   -29 Text Interpretation:  Atrial fibrillation Borderline left axis deviation Low voltage, precordial leads Anteroseptal infarct, old Confirmed by Zenia Resides  MD, [place] ([id number]) on 03/12/2013 10:43:47 AM             Leota Jacobsen, MD 03/16/13 [phone]

## 2013-03-16 NOTE — Assessment & Plan Note (Signed)
Hb 9.8; B12 and folate without deficiency;will monitor weekly every Friday pm

## 2013-03-16 NOTE — Assessment & Plan Note (Signed)
Improved greatly with Lasix which will continue BID;also fluid restriction 1.5 L; pt on Bblocker and ASA; ACE d/c sec to hypotension

## 2013-03-16 NOTE — Progress Notes (Signed)
MRN: [id number] Name: [name].  Sex: male Age: 80 y.o. DOB: [birth year]  [place] #: Karren [facility] Facility/Room: 125A Level Of Care: SNF Provider: Inocencio Homes D Emergency Contacts: Extended Emergency Contact Information Primary Emergency Contact: Silverio,Geraldine Address: [address]          [place] [address] [place] Phone: [phone] Relation: Spouse  Code Status: FULL  Allergies: Review of patient's allergies indicates no known allergies.  Chief Complaint  Patient presents with  . nursing home admission    HPI: Patient is 80 y.o. male who underwent CABG X 2, mitral valve repair and MAZE procedure for a fib 02/28/2013, who is admitted to SNF for OT/PT after having been sent home and failing as well as having post op delirium which is much improved. Pt is back in a fib and on coumadin. Past Medical History  Diagnosis Date  . Personal history of colonic polyps     tubular adenoma  . Hypertension   . Irritable bowel syndrome   . Prostate cancer 1991    sees Dr. Carlota Raspberry at [place], observation only   . Atrial fibrillation   . S/P mitral valve repair, maze procedure, and CABG x2 02/28/2013    Complex valvuloplasty including triangular resection of posterior leaflet, 30 mm Sorin Memo 3D ring annuloplasty  . S/P CABG x 2 02/28/2013    LIMA to LAD, SVG to OM1, EVH via right thigh  . S/P Maze operation for atrial fibrillation 02/28/2013    Complete bilateral atrial lesion set using bipolar radiofrequency and cryothermy ablation with clipping of LA appendage  . Pleural effusion, bilateral 03/12/2013    Small to moderate, L>R  . Complication of anesthesia     "serious cognisance problems since my OR in January" (03/13/2013  . Coronary artery disease     Past Surgical History  Procedure Laterality Date  . Hemorrhoid surgery    . Prostate biopsy    . Colonoscopy  2011    per Dr. Sharlett Iles, benign polyps, no repeats needed   . Cataract extraction,  bilateral Bilateral 2014    per Dr. Gershon Crane   . Coronary artery bypass graft N/A 02/28/2013    Procedure: CORONARY ARTERY BYPASS GRAFTING (CABG);  Surgeon: Rexene Alberts, MD;  Location: [place];  Service: Open Heart Surgery;  Laterality: N/A;  CABG x 2,  using left internal mammary artery and right leg saphenous vein harvested endoscopically  . Intraoperative transesophageal echocardiogram N/A 02/28/2013    Procedure: INTRAOPERATIVE TRANSESOPHAGEAL ECHOCARDIOGRAM;  Surgeon: Rexene Alberts, MD;  Location: [place];  Service: Open Heart Surgery;  Laterality: N/A;  . Maze N/A 02/28/2013    Procedure: MAZE;  Surgeon: Rexene Alberts, MD;  Location: [place];  Service: Open Heart Surgery;  Laterality: N/A;  . Mitral valve repair N/A 02/28/2013    Procedure: MITRAL VALVE REPAIR (MVR);  Surgeon: Rexene Alberts, MD;  Location: [place];  Service: Open Heart Surgery;  Laterality: N/A;  . Cardiac catheterization        Medication List       This list is accurate as of: 03/16/13 10:36 AM.  Always use your most recent med list.               amiodarone 200 MG tablet  Commonly known as:  PACERONE  Take 200 mg by mouth 2 (two) times daily after a meal. For 1 week, then decrease to 200 mg daily     aspirin 81  MG tablet  Take 1 tablet (81 mg total) by mouth daily.     atorvastatin 10 MG tablet  Commonly known as:  LIPITOR  Take 10 mg by mouth daily. At 6pm     feeding supplement (ENSURE) Pudg  Take 1 Container by mouth 2 (two) times daily between meals.     fish oil-omega-3 fatty acids 1000 MG capsule  Take 1 g by mouth every morning.     furosemide 40 MG tablet  Commonly known as:  LASIX  Take 1 tablet (40 mg total) by mouth 2 (two) times daily.     levothyroxine 50 MCG tablet  Commonly known as:  SYNTHROID, LEVOTHROID  Take 1 tablet (50 mcg total) by mouth daily.     metoprolol tartrate 25 MG tablet  Commonly known as:  LOPRESSOR  Take 12.5 mg by mouth 2 (two) times daily.     potassium  chloride 10 MEQ tablet  Commonly known as:  K-DUR  Take 2 tablets (20 mEq total) by mouth daily.     traMADol 50 MG tablet  Commonly known as:  ULTRAM  Take 1 tablet (50 mg total) by mouth every 6 (six) hours as needed for moderate pain.     vitamin C 500 MG tablet  Commonly known as:  ASCORBIC ACID  Take 500 mg by mouth every morning.     Vitamin D 1000 UNITS capsule  Take 2,000 Units by mouth every morning.     warfarin 2.5 MG tablet  Commonly known as:  COUMADIN  Take 1 tablet (2.5 mg total) by mouth daily at 6 PM. Dose 6 mg on 03/14/13, and then usual home dose of 2.5 mg 03/15/13.        Meds ordered this encounter  Medications  . amiodarone (PACERONE) 200 MG tablet    Sig: Take 200 mg by mouth 2 (two) times daily after a meal. For 1 week, then decrease to 200 mg daily    Immunization History  Administered Date(s) Administered  . Influenza Split 11/01/2011  . Influenza Whole 12/15/2006, 12/06/2007, 12/31/2009  . Influenza,inj,Quad PF,36+ Mos 11/01/2012  . Pneumococcal Polysaccharide-23 02/23/2004  . Td 02/22/2002    History  Substance Use Topics  . Smoking status: Never Smoker   . Smokeless tobacco: Never Used  . Alcohol Use: No    Family history is noncontributory    Review of Systems  DATA OBTAINED: from patient:PT HAS NO C/O,SAYS HIS HALLUCINATIONS AND CONFUSION IS MUCH MUCH BETTER GENERAL: Feels well no fevers, fatigue, appetite changes SKIN: No itching, rash; DENIES PAIN FROM CHEST INCISION EYES: No eye pain, redness, discharge EARS: No earache, tinnitus, change in hearing NOSE: No congestion, drainage or bleeding  MOUTH/THROAT: No mouth or tooth pain, No sore throat, No difficulty chewing or swallowing  RESPIRATORY: No cough, wheezing, SOB CARDIAC: No chest pain, palpitations, lower extremity edema  GI: No abdominal pain, No N/V/D or constipation, No heartburn or reflux  GU: No dysuria, frequency or urgency, or incontinence  MUSCULOSKELETAL: No  unrelieved bone/joint pain NEUROLOGIC: No headache, dizziness or focal weakness PSYCHIATRIC: No overt anxiety or sadness. Sleeps well. No behavior issue.   Filed Vitals:   03/16/13 0953  BP: 127/66  Pulse: 86  Temp: 98.9 F (37.2 C)  Resp: 18    Physical Exam  GENERAL APPEARANCE: Alert, conversant. Appropriately groomed. No acute distress.  SKIN: No diaphoresis rash; chest incision healing without infection HEAD: Normocephalic, atraumatic  EYES: Conjunctiva/lids clear. Pupils round, reactive. EOMs intact.  EARS: External exam WNL, canals clear. Hearing grossly normal.  NOSE: No deformity or discharge.  MOUTH/THROAT: Lips w/o lesions. RESPIRATORY: Breathing is even, unlabored. Lung sounds are clear   CARDIOVASCULAR: Heart RRR no murmurs, rubs or gallops. No peripheral edema.  GASTROINTESTINAL: Abdomen is soft, non-tender, not distended w/ normal bowel sounds. GENITOURINARY: Bladder non tender, not distended  MUSCULOSKELETAL: No abnormal joints or musculature NEUROLOGIC: Oriented X3. Cranial nerves 2-12 grossly intact. Moves all extremities no tremor. PSYCHIATRIC: Mood and affect appropriate to situation, no behavioral issues  Patient Active Problem List   Diagnosis Date Noted  . Current use of long term anticoagulation 03/16/2013  . Chronic diastolic CHF (congestive heart failure) 03/12/2013  . Anemia- post op 03/12/2013  . Atrial fibrillation- back in AF with CVR 03/12/2013  . Pre-syncope 03/12/2013  . Hypotension, unspecified 03/12/2013  . Pleural effusion, bilateral 03/12/2013  . Delirium 03/12/2013  . CAD - LIMA-LAD, SVG-OM Feb 29 2012 03/06/2013  . S/P mitral valve repair, maze procedure, and CABG x2 02/29/12 02/28/2013  . Unstable angina 02/26/2013  . PAF- pre and post op, now on Amiodarone 02/25/2013  . Paroxysmal atrial fibrillation 01/10/2011  . Special screening for malignant neoplasms, colon 10/05/2010  . Benign neoplasm of colon 10/05/2010  . Diverticulosis of  colon (without mention of hemorrhage) 10/05/2010  . ADENOCARCINOMA, PROSTATE, GLEASON GRADE 3 08/13/2009  . SEBACEOUS CYST 08/13/2009  . HYPOTHYROIDISM 08/02/2007  . GERD 08/02/2007  . OSTEOPENIA 08/02/2007  . UNS ADVRS EFF OTH RX MEDICINAL&BIOLOGICAL SBSTNC 08/02/2007  . HYPERLIPIDEMIA 12/15/2006  . RHINITIS, CHRONIC 12/15/2006  . PROSTATE SPECIFIC ANTIGEN, ELEVATED 12/15/2006  . HYPERTENSION 09/05/2006  . ARTHRITIS 09/05/2006  . COLONIC POLYPS, HX OF 09/05/2006    CBC    Component Value Date/Time   WBC 9.9 03/14/2013 0448   RBC 3.18* 03/14/2013 0448   HGB 9.8* 03/14/2013 0448   HCT 29.5* 03/14/2013 0448   PLT 345 03/14/2013 0448   MCV 92.8 03/14/2013 0448   LYMPHSABS 0.6* 03/12/2013 0945   MONOABS 0.8 03/12/2013 0945   EOSABS 0.2 03/12/2013 0945   BASOSABS 0.0 03/12/2013 0945    CMP     Component Value Date/Time   NA 138 03/14/2013 0448   K 4.0 03/14/2013 0448   CL 103 03/14/2013 0448   CO2 20 03/14/2013 0448   GLUCOSE 106* 03/14/2013 0448   BUN 12 03/14/2013 0448   CREATININE 0.86 03/14/2013 0448   CALCIUM 8.5 03/14/2013 0448   PROT 6.4 03/13/2013 0440   ALBUMIN 2.9* 03/13/2013 0440   AST 36 03/13/2013 0440   ALT 54* 03/13/2013 0440   ALKPHOS 93 03/13/2013 0440   BILITOT 0.7 03/13/2013 0440   GFRNONAA 78* 03/14/2013 0448   GFRAA >90 03/14/2013 0448    Assessment and Plan  S/P mitral valve repair, maze procedure, and CABG x2 02/29/12 Then was d/c to home. Returned to hospital and defecation syncope and family reported his having visual hallucinations and increased weakness since d/c to home; cards and CTS saw pt, d/c ACE for low BP and had no further recommendations; PT/OT  Delirium Felt to be post op delirium; no indication of infection or electrolyte abn, cx negative, CT head neg; resolving  Chronic diastolic CHF (congestive heart failure) Improved greatly with Lasix which will continue BID;also fluid restriction 1.5 L; pt on Bblocker and ASA; ACE d/c sec to hypotension  Pleural  effusion, bilateral Felt secondary to recent acute on chronic CHF; needs repear DCR at f/u with cards on 1/30.  Atrial  fibrillation- back in AF with CVR Pt underwent MAZE procedure 1/7 but pt went back into a fib. Rate controlled on Pacerone BID until 1/28, then 200 mg daily after that, and lopressor; anticoagulated on coumadin just started, INR due today on 2.5 mg daily for only several days after a load of 6 mg on 1/21  HYPERTENSION ACE was d/c because BP too low; now on  Lopressor 12.5 mg BID and Lasix 40 mg BID  HYPERLIPIDEMIA HDL-37, LDL 102; goal would be <70; pt on Lipitor 10 mg daily; will be rechecked by cards  Current use of long term anticoagulation Pt was loaded with 6 mg coumadin on 1/21 the 2.5 daily after that;will; adjust for INR 2.5-3.5 secondary to valve repair  Anemia- post op Hb 9.8; B12 and folate without deficiency;will monitor weekly every Friday pm  HYPOTHYROIDISM TSH - 2.87 three days ago; will continue synthroid 50 ncg    Hennie Duos, MD

## 2013-03-16 NOTE — Assessment & Plan Note (Signed)
Felt to be post op delirium; no indication of infection or electrolyte abn, cx negative, CT head neg; resolving

## 2013-03-16 NOTE — Assessment & Plan Note (Signed)
Then was d/c to home. Returned to hospital and defecation syncope and family reported his having visual hallucinations and increased weakness since d/c to home; cards and CTS saw pt, d/c ACE for low BP and had no further recommendations; PT/OT

## 2013-03-16 NOTE — Assessment & Plan Note (Signed)
Pt was loaded with 6 mg coumadin on 1/21 the 2.5 daily after that;will; adjust for INR 2.5-3.5 secondary to valve repair

## 2013-03-16 NOTE — Assessment & Plan Note (Signed)
Pt underwent MAZE procedure 1/7 but pt went back into a fib. Rate controlled on Pacerone BID until 1/28, then 200 mg daily after that, and lopressor; anticoagulated on coumadin just started, INR due today on 2.5 mg daily for only several days after a load of 6 mg on 1/21

## 2013-03-18 LAB — CULTURE, BLOOD (ROUTINE X 2)
CULTURE: NO GROWTH
Culture: NO GROWTH

## 2013-03-19 ENCOUNTER — Telehealth: Payer: Self-pay | Admitting: Physician Assistant

## 2013-03-19 DIAGNOSIS — Z48812 Encounter for surgical aftercare following surgery on the circulatory system: Secondary | ICD-10-CM

## 2013-03-19 NOTE — Telephone Encounter (Signed)
New message     Dad is in a rehab facility----will he still need to keep his appt on the 30th?   They will need to arrange for transportation to get him here if he needs to keepit.

## 2013-03-19 NOTE — Telephone Encounter (Signed)
I cb and advised pt's wife that yes pt does need to keep his appt with Brynda Rim. PA 1/30 @ 8:50. Pt's wife said ok and thank you.

## 2013-03-21 ENCOUNTER — Encounter: Payer: Self-pay | Admitting: *Deleted

## 2013-03-22 ENCOUNTER — Other Ambulatory Visit: Payer: Self-pay | Admitting: *Deleted

## 2013-03-22 DIAGNOSIS — I2510 Atherosclerotic heart disease of native coronary artery without angina pectoris: Secondary | ICD-10-CM

## 2013-03-23 ENCOUNTER — Telehealth: Payer: Self-pay | Admitting: *Deleted

## 2013-03-23 ENCOUNTER — Ambulatory Visit (INDEPENDENT_AMBULATORY_CARE_PROVIDER_SITE_OTHER): Payer: Medicare HMO | Admitting: Physician Assistant

## 2013-03-23 ENCOUNTER — Encounter: Payer: Self-pay | Admitting: Physician Assistant

## 2013-03-23 VITALS — BP 132/79 | HR 98 | Ht 69.0 in | Wt 178.6 lb

## 2013-03-23 DIAGNOSIS — Z9889 Other specified postprocedural states: Secondary | ICD-10-CM

## 2013-03-23 DIAGNOSIS — I5030 Unspecified diastolic (congestive) heart failure: Secondary | ICD-10-CM

## 2013-03-23 DIAGNOSIS — I10 Essential (primary) hypertension: Secondary | ICD-10-CM

## 2013-03-23 DIAGNOSIS — I509 Heart failure, unspecified: Secondary | ICD-10-CM

## 2013-03-23 DIAGNOSIS — I4891 Unspecified atrial fibrillation: Secondary | ICD-10-CM

## 2013-03-23 DIAGNOSIS — E785 Hyperlipidemia, unspecified: Secondary | ICD-10-CM

## 2013-03-23 DIAGNOSIS — I2510 Atherosclerotic heart disease of native coronary artery without angina pectoris: Secondary | ICD-10-CM

## 2013-03-23 LAB — BASIC METABOLIC PANEL
BUN: 16 mg/dL (ref 6–23)
CHLORIDE: 101 meq/L (ref 96–112)
CO2: 27 meq/L (ref 19–32)
Calcium: 9.1 mg/dL (ref 8.4–10.5)
Creatinine, Ser: 1.1 mg/dL (ref 0.4–1.5)
GFR: 68.58 mL/min (ref >60.00)
GLUCOSE: 126 mg/dL — AB (ref 70–99)
POTASSIUM: 4.2 meq/L (ref 3.5–5.1)
SODIUM: 135 meq/L (ref 135–145)

## 2013-03-23 MED ORDER — WARFARIN SODIUM 2.5 MG PO TABS: ORAL_TABLET | ORAL | Status: DC

## 2013-03-23 MED ORDER — METOPROLOL TARTRATE 25 MG PO TABS: 25.0000 mg | ORAL_TABLET | Freq: Two times a day (BID) | ORAL | Status: DC

## 2013-03-23 NOTE — Telephone Encounter (Signed)
lmptcb for lab results 

## 2013-03-23 NOTE — Telephone Encounter (Signed)
lmom labs ok, no changes to be made

## 2013-03-23 NOTE — Progress Notes (Signed)
[place] [place], [place]  [address] Phone: [phone] Fax:  [phone]  Date:  03/23/2013   ID:  Luis Buba., DOB 1927/12/10, MRN [id number]  PCP:  [name], MD  Cardiologist:  Dr. Dorris Carnes     History of Present Illness: Luis Tabacco. is a 80 y.o. male with a hx of AFib, HTN, HL, hypothyroidism, prostate CA.  He was admitted to the hospital 1/4-1/13 with unstable angina.  LHC demonstrated 3 v CAD and was referred for CABG + MAZE procedure.  Intraoperative TEE demonstrated MVP with mod to severe MR.  He underwent CABG (L-LAD, S-OM1), MV repair and MAZE with cryothermy ablation clipping of LAA.  Post op course c/b AFib and was placed on Amiodarone.  Carotid US (02/27/13):  1-39% bilateral.  He was readmitted 1/19-1/21. He presented with weakness and near syncope in the setting of hypotension, delirium (likely postoperative) and mild volume overload in the setting of atrial fibrillation with rapid rate. CT scan was negative for pulmonary embolism. He did have some pleural effusions on chest x-ray.  He was gently diuresed. Echo (03/13/2013):  Mild LVH, EF 55-60%, mild AI, mild MS, mod LAE, mod RVE, mild RAE, PASP 31.  Notes indicate he was back in sinus rhythm prior to discharge. He was continued on Coumadin. He was discharged to a skilled nursing facility.  He is currently living at [facility]).  He is progressing well.  Denies chest soreness.  Denies significant DOE.  No orthopnea, PND, edema.  No syncope.  He thinks he will be d/c to home next week.    Recent Labs: 02/26/2013: HDL Cholesterol 37*; LDL (calc) 102*  03/12/2013: Pro B Natriuretic peptide (BNP) 1901.0*  03/13/2013: ALT 54*; TSH 2.827  03/14/2013: Creatinine 0.86; Hemoglobin 9.8*; Potassium 4.0   Wt Readings from Last 3 Encounters:  03/14/13 181 lb (82.1 kg)  03/06/13 189 lb 11.2 oz (86.047 kg)  03/06/13 189 lb 11.2 oz (86.047 kg)     Past Medical History  Diagnosis Date  .  Personal history of colonic polyps     tubular adenoma  . Hypertension   . Irritable bowel syndrome   . Prostate cancer 1991    sees Dr. Carlota Raspberry at [place], observation only   . Atrial fibrillation   . S/P mitral valve repair, maze procedure, and CABG x2 02/28/2013    Complex valvuloplasty including triangular resection of posterior leaflet, 30 mm Sorin Memo 3D ring annuloplasty  . S/P CABG x 2 02/28/2013    LIMA to LAD, SVG to OM1, EVH via right thigh  . S/P Maze operation for atrial fibrillation 02/28/2013    Complete bilateral atrial lesion set using bipolar radiofrequency and cryothermy ablation with clipping of LA appendage  . Pleural effusion, bilateral 03/12/2013    Small to moderate, L>R  . Complication of anesthesia     "serious cognisance problems since my OR in January" (03/13/2013  . Coronary artery disease     Current Outpatient Prescriptions  Medication Sig Dispense Refill  . amiodarone (PACERONE) 200 MG tablet Take 200 mg by mouth 2 (two) times daily after a meal. For 1 week, then decrease to 200 mg daily      . aspirin 81 MG tablet Take 1 tablet (81 mg total) by mouth daily.      [name] atorvastatin (LIPITOR) 10 MG tablet Take 10 mg by mouth daily. At 6pm      . Cholecalciferol (VITAMIN D)  1000 UNITS capsule Take 2,000 Units by mouth every morning.       . feeding supplement, ENSURE, (ENSURE) PUDG Take 1 Container by mouth 2 (two) times daily between meals.    0  . fish oil-omega-3 fatty acids 1000 MG capsule Take 1 g by mouth every morning.       . furosemide (LASIX) 40 MG tablet Take 1 tablet (40 mg total) by mouth 2 (two) times daily.  30 tablet    . levothyroxine (SYNTHROID, LEVOTHROID) 50 MCG tablet Take 1 tablet (50 mcg total) by mouth daily.  90 tablet  3  . metoprolol tartrate (LOPRESSOR) 25 MG tablet Take 12.5 mg by mouth 2 (two) times daily.      . potassium chloride (K-DUR) 10 MEQ tablet Take 2 tablets (20 mEq total) by mouth daily.      . traMADol (ULTRAM) 50 MG tablet  Take 1 tablet (50 mg total) by mouth every 6 (six) hours as needed for moderate pain.  120 tablet  5  . vitamin C (ASCORBIC ACID) 500 MG tablet Take 500 mg by mouth every morning.       . warfarin (COUMADIN) 2.5 MG tablet Take 1 tablet (2.5 mg total) by mouth daily at 6 PM. Dose 6 mg on 03/14/13, and then usual home dose of 2.5 mg 03/15/13.  30 tablet  3   No current facility-administered medications for this visit.    Allergies:   Review of patient's allergies indicates no known allergies.   Social History:  The patient  reports that he has never smoked. He has never used smokeless tobacco. He reports that he does not drink alcohol or use illicit drugs.   Family History:  The patient's family history includes Diabetes in an other family member; Heart disease in his son and another family member; Hypertension in an other family member; Lung cancer in his brother; Stomach cancer in his brother; Stroke in an other family member; Sudden death in an other family member.   ROS:  Please see the history of present illness.      All other systems reviewed and negative.   PHYSICAL EXAM: VS:  BP 132/79  Pulse 98  Ht 5\' 9"  (1.753 m)  Wt 178 lb 9.6 oz (81.012 kg)  BMI 26.36 kg/m2 Well nourished, well developed, in no acute distress HEENT: normal Neck: no JVD Cardiac:  normal S1, S2; irregular rhythm; no murmur Chest: Median sternotomy well healed without erythema or discharge Lungs:  clear to auscultation bilaterally, no wheezing, rhonchi or rales Abd: soft, nontender, no hepatomegaly Ext: no edema Skin: warm and dry Neuro:  CNs 2-12 intact, no focal abnormalities noted  EKG:  NSR, HR 98, first degree AV block with frequent PACs     ASSESSMENT AND PLAN:  1. Atrial Fibrillation:  He is s/p MAZE procedure with LAA clipping.  He is currently in NSR.  Continue current dose of Amiodarone for now.  Will likely d/c this in the future.  Coumadin is managed at SNF.  HR is a little high.  I will increase  his Metoprolol Tartrate to 25 BID as his BP can tolerate.   2. Mitral Regurgitation, s/p MV Repair:  Stable by echo in the hospital recently. 3. CAD, s/p CABG:  No angina.  Continue ASA, statin.  Refer to cardiac rehab as he will be leaving SNF soon. 4. Hypertension:  Controlled. 5. Heart Failure with Preserved Ejection Fraction (HFpEF):  Volume stable.  Continue current Rx.  Check BMET today. 6. Hyperlipidemia:  Continue statin.   7. Disposition:  F/u with Dr. Dorris Carnes or me in 4-6 weeks.  He sees Dr. Roxy Manns next week.   Signed, Richardson Dopp, PA-C  03/23/2013 8:18 AM

## 2013-03-23 NOTE — Patient Instructions (Signed)
INCREASE METOPROLOL TO 25 MG 1 TABLET TWICE DAILY   LAB WORK TODAY; BMET  You have been referred to [place]  Your physician recommends that you schedule a follow-up appointment ON 04/23/13 @ 8:45 WITH DR. Nevin Bloodgood ROSS

## 2013-03-26 ENCOUNTER — Ambulatory Visit (INDEPENDENT_AMBULATORY_CARE_PROVIDER_SITE_OTHER): Payer: Self-pay | Admitting: Thoracic Surgery (Cardiothoracic Vascular Surgery)

## 2013-03-26 ENCOUNTER — Ambulatory Visit
Admission: RE | Admit: 2013-03-26 | Discharge: 2013-03-26 | Disposition: A | Discharge status: Home / Self Care | Payer: Medicare HMO | Source: Ambulatory Visit | Attending: Thoracic Surgery (Cardiothoracic Vascular Surgery) | Admitting: Thoracic Surgery (Cardiothoracic Vascular Surgery)

## 2013-03-26 ENCOUNTER — Encounter: Payer: Self-pay | Admitting: Thoracic Surgery (Cardiothoracic Vascular Surgery)

## 2013-03-26 VITALS — BP 122/74 | HR 90 | Resp 20 | Ht 69.0 in | Wt 178.0 lb

## 2013-03-26 DIAGNOSIS — Z9889 Other specified postprocedural states: Secondary | ICD-10-CM

## 2013-03-26 DIAGNOSIS — Z951 Presence of aortocoronary bypass graft: Secondary | ICD-10-CM

## 2013-03-26 DIAGNOSIS — I2510 Atherosclerotic heart disease of native coronary artery without angina pectoris: Secondary | ICD-10-CM

## 2013-03-26 DIAGNOSIS — Z8679 Personal history of other diseases of the circulatory system: Secondary | ICD-10-CM | POA: Insufficient documentation

## 2013-03-26 NOTE — Progress Notes (Signed)
[address]       [place] [address]             [phone]     CARDIOTHORACIC SURGERY OFFICE NOTE  PCP is [name], MD Primary Cardiologist is ROSS, [name], MD  HPI:  Patient returns for followup status post mitral valve repair, coronary artery bypass grafting x2, and Maze procedure on 02/28/2013.  His initial postoperative recovery in the hospital was overall uncomplicated although somewhat slow. Although he was initially sinus rhythm early after surgery, he developed recurrent persistent rate controlled atrial fibrillation prior to hospital discharge. He was initially discharged home but subsequently was readmitted to the hospital 2 days later with a brief episode of dizziness, generalized weakness, and mild confusion. After a fairly quick recovery he was discharged again from the hospital, this time to a local skilled nursing facility where he remains presently.  While he was hospitalized a second time he was noted to be in sinus rhythm. Since hospital discharge his prothrombin time was checked the end of last week. By report his INR was 1.65. His dose of Coumadin was increased.  He was seen in followup by Richardson Dopp at Dr Harrington Challenger' office on 03/23/2013.  He reportedly remained in sinus rhythm at that time. His dose of metoprolol was increased to 25 mg twice daily.  Since then the patient developed several recurrent nosebleeds. Coumadin has been discontinued for the past 2 days. The patient returns for office for followup today.  The patient reports feeling better and increasing his exercise tolerance. He denies any significant pain in his chest and he is no longer taking any sort of pain relievers other than occasional Tylenol. He notes that he has not had any further problems with disorientation or confusion such as what developed in associated with previous use of oral pain relievers.  He states that he is sleeping well and his strength is improving. He denies any  shortness of breath. He denies any dizzy spells. He hopes to be discharged home soon.   Current Outpatient Prescriptions  Medication Sig Dispense Refill  . aspirin 81 MG tablet Take 1 tablet (81 mg total) by mouth daily.      [name] atorvastatin (LIPITOR) 10 MG tablet Take 10 mg by mouth daily. At 6pm      . Cholecalciferol (VITAMIN D) 1000 UNITS capsule Take 2,000 Units by mouth every morning.       . feeding supplement, ENSURE, (ENSURE) PUDG Take 1 Container by mouth 2 (two) times daily between meals.    0  . fish oil-omega-3 fatty acids 1000 MG capsule Take 1 g by mouth every morning.       . furosemide (LASIX) 40 MG tablet Take 1 tablet (40 mg total) by mouth 2 (two) times daily.  30 tablet    . levothyroxine (SYNTHROID, LEVOTHROID) 50 MCG tablet Take 1 tablet (50 mcg total) by mouth daily.  90 tablet  3  . metoprolol tartrate (LOPRESSOR) 25 MG tablet Take 1 tablet (25 mg total) by mouth 2 (two) times daily.      . potassium chloride (K-DUR) 10 MEQ tablet Take 2 tablets (20 mEq total) by mouth daily.      . traMADol (ULTRAM) 50 MG tablet Take 1 tablet (50 mg total) by mouth every 6 (six) hours as needed for moderate pain.  120 tablet  5  . vitamin C (ASCORBIC ACID) 500 MG tablet Take 500 mg by mouth every  morning.       [name] amiodarone (PACERONE) 200 MG tablet Take 200 mg by mouth daily.       [name] warfarin (COUMADIN) 2.5 MG tablet PER MELISSA, RN @ SNF PT ON 4 MG 03/23/13       No current facility-administered medications for this visit.      Physical Exam:   BP 122/74  Pulse 90  Resp 20  Ht 5\' 9"  (1.753 m)  Wt 178 lb (80.74 kg)  BMI 26.27 kg/m2  SpO2 97%  General:  Well developed, well nourished  Chest:   Clear to auscultation in all fields  CV:   A. Fib, rate controlled  Incisions:  Healing nicely, clean and dry   Abdomen:  Soft and nontender  Extremities:  Warm and well perfused  Diagnostic Tests:  2-channel telemetry rhythm strip demonstrates atrial fibrillation with a  controlled ventricular rate   CHEST 2 VIEW  COMPARISON: 03/12/2013  FINDINGS:  Small left pleural effusion. Left base atelectasis noted. Heart size  is upper limits normal, decreased since prior study. Changes of  prior CABG and valve replacement. No confluent opacity on the right.  No pneumothorax. No acute bony abnormality.  IMPRESSION:  Small left pleural effusion with left base atelectasis.  Electronically Signed  By: Rolm Baptise M.D.  On: 03/26/2013 13:52     Impression:  Patient appears to be making good progress nearly 4 weeks status post mitral valve repair, coronary artery bypass grafting, and Maze procedure. 2 channel telemetry rhythm strip demonstrates recurrence of atrial fibrillation with stable heart rate. Coumadin therapy is temporarily on hold because of recurrent epistaxis over the last few days. Otherwise the patient seems to be doing well.   Plan:  I've encouraged patient to continue to gradually increase his physical activity. It might be reasonable to consider discharge to home care in the near future depending upon his progress with physical therapy at [facility] skilled nursing facility. If he is to be discharged home then I would recommend immediately beginning home health physical therapy until he is ready to be transitioned to the outpatient cardiac rehabilitation program. I have suggested that the patient should consider resuming Coumadin next week if his nosebleeds have stopped completely. We discussed the risks of stroke associated with atrial fibrillation and recent mitral valve repair. If epistaxis recurs once Coumadin has been resumed then the patient may have to stop Coumadin indefinitely and accept a slightly increased risk of stroke. Fortunately, his left atrial appendage was clipped at the time of surgery.  We have not made any other change to the patient's current medications.  The patient is scheduled to followup with Dr. Harrington Challenger early next month. We  will plan to see him back in 2 months or sooner as needed.    Valentina Gu. Roxy Manns, MD 03/26/2013 2:43 PM

## 2013-03-26 NOTE — Patient Instructions (Signed)
Resume taking Coumadin next week at the same dose as previously if nosebleeds has not recurred.  The patient should continue to avoid any heavy lifting or strenuous use of arms or shoulders for at least a total of three months from the time of surgery.  Begin home health physical therapy as soon as possible once to our discharge from the skilled nursing facility and transitioned to outpatient cardiac rehabilitation once physical therapy feels you are ready.

## 2013-03-27 ENCOUNTER — Encounter: Payer: Self-pay | Admitting: Internal Medicine

## 2013-03-27 ENCOUNTER — Non-Acute Institutional Stay (SKILLED_NURSING_FACILITY): Payer: Medicare HMO | Admitting: Internal Medicine

## 2013-03-27 DIAGNOSIS — C61 Malignant neoplasm of prostate: Secondary | ICD-10-CM

## 2013-03-27 DIAGNOSIS — I480 Paroxysmal atrial fibrillation: Secondary | ICD-10-CM

## 2013-03-27 DIAGNOSIS — E039 Hypothyroidism, unspecified: Secondary | ICD-10-CM

## 2013-03-27 DIAGNOSIS — Z951 Presence of aortocoronary bypass graft: Secondary | ICD-10-CM

## 2013-03-27 DIAGNOSIS — I4891 Unspecified atrial fibrillation: Secondary | ICD-10-CM

## 2013-03-27 DIAGNOSIS — Z9889 Other specified postprocedural states: Secondary | ICD-10-CM

## 2013-03-27 DIAGNOSIS — Z8679 Personal history of other diseases of the circulatory system: Secondary | ICD-10-CM

## 2013-03-27 DIAGNOSIS — I2510 Atherosclerotic heart disease of native coronary artery without angina pectoris: Secondary | ICD-10-CM

## 2013-03-27 DIAGNOSIS — I509 Heart failure, unspecified: Secondary | ICD-10-CM

## 2013-03-27 DIAGNOSIS — J90 Pleural effusion, not elsewhere classified: Secondary | ICD-10-CM

## 2013-03-27 DIAGNOSIS — I10 Essential (primary) hypertension: Secondary | ICD-10-CM

## 2013-03-27 DIAGNOSIS — E785 Hyperlipidemia, unspecified: Secondary | ICD-10-CM

## 2013-03-27 DIAGNOSIS — I5032 Chronic diastolic (congestive) heart failure: Secondary | ICD-10-CM

## 2013-03-27 DIAGNOSIS — D649 Anemia, unspecified: Secondary | ICD-10-CM

## 2013-03-27 DIAGNOSIS — I200 Unstable angina: Secondary | ICD-10-CM

## 2013-03-27 NOTE — Progress Notes (Signed)
MRN: [id number] Name: [name].  Sex: male Age: 80 y.o. DOB: 04/28/1927  [place] #: Karren Burly Facility/Room: 125A Level Of Care: SNF Provider: Inocencio Homes D Emergency Contacts: Extended Emergency Contact Information Primary Emergency Contact: Hildreth,Geraldine Address: [address]          [place] [address] [place] Phone: [phone] Relation: Spouse    Allergies: Review of patient's allergies indicates no known allergies.  Chief Complaint  Patient presents with  . Discharge Note    HPI: Patient is 80 y.o. male who underwent mitral valve repair and MAZE operation for atrial fibrillation who was admitted for PT/OT who is now stable to be discharged to home.  Past Medical History  Diagnosis Date  . Personal history of colonic polyps     tubular adenoma  . Hypertension   . Irritable bowel syndrome   . Prostate cancer 1991    sees Dr. Carlota Raspberry at [facility], observation only   . Atrial fibrillation   . S/P mitral valve repair, maze procedure, and CABG x2 02/28/2013    Complex valvuloplasty including triangular resection of posterior leaflet, 30 mm Sorin Memo 3D ring annuloplasty  . S/P CABG x 2 02/28/2013    LIMA to LAD, SVG to OM1, EVH via right thigh  . S/P Maze operation for atrial fibrillation 02/28/2013    Complete bilateral atrial lesion set using bipolar radiofrequency and cryothermy ablation with clipping of LA appendage  . Pleural effusion, bilateral 03/12/2013    Small to moderate, L>R  . Complication of anesthesia     "serious cognisance problems since my OR in January" (03/13/2013  . Coronary artery disease     Past Surgical History  Procedure Laterality Date  . Hemorrhoid surgery    . Prostate biopsy    . Colonoscopy  2011    per Dr. Sharlett Iles, benign polyps, no repeats needed   . Cataract extraction, bilateral Bilateral 2014    per Dr. Gershon Crane   . Coronary artery bypass graft N/A 02/28/2013    Procedure: CORONARY ARTERY  BYPASS GRAFTING (CABG);  Surgeon: Rexene Alberts, MD;  Location: [facility];  Service: Open Heart Surgery;  Laterality: N/A;  CABG x 2,  using left internal mammary artery and right leg saphenous vein harvested endoscopically  . Intraoperative transesophageal echocardiogram N/A 02/28/2013    Procedure: INTRAOPERATIVE TRANSESOPHAGEAL ECHOCARDIOGRAM;  Surgeon: Rexene Alberts, MD;  Location: [facility];  Service: Open Heart Surgery;  Laterality: N/A;  . Maze N/A 02/28/2013    Procedure: MAZE;  Surgeon: Rexene Alberts, MD;  Location: [facility];  Service: Open Heart Surgery;  Laterality: N/A;  . Mitral valve repair N/A 02/28/2013    Procedure: MITRAL VALVE REPAIR (MVR);  Surgeon: Rexene Alberts, MD;  Location: [facility];  Service: Open Heart Surgery;  Laterality: N/A;  . Cardiac catheterization        Medication List       This list is accurate as of: 03/27/13  1:09 PM.  Always use your most recent med list.               amiodarone 200 MG tablet  Commonly known as:  PACERONE  Take 200 mg by mouth daily.     aspirin 81 MG tablet  Take 1 tablet (81 mg total) by mouth daily.     atorvastatin 10 MG tablet  Commonly known as:  LIPITOR  Take 10 mg by mouth daily. At 6pm     fish  oil-omega-3 fatty acids 1000 MG capsule  Take 1 g by mouth every morning.     furosemide 40 MG tablet  Commonly known as:  LASIX  Take 1 tablet (40 mg total) by mouth 2 (two) times daily.     levothyroxine 50 MCG tablet  Commonly known as:  SYNTHROID, LEVOTHROID  Take 1 tablet (50 mcg total) by mouth daily.     metoprolol tartrate 25 MG tablet  Commonly known as:  LOPRESSOR  Take 1 tablet (25 mg total) by mouth 2 (two) times daily.     potassium chloride 10 MEQ tablet  Commonly known as:  K-DUR  Take 2 tablets (20 mEq total) by mouth daily.     traMADol 50 MG tablet  Commonly known as:  ULTRAM  Take 1 tablet (50 mg total) by mouth every 6 (six) hours as needed for moderate pain.     TYLENOL 500 MG tablet  Generic drug:   acetaminophen  Take 1,000 mg by mouth every 6 (six) hours as needed.     vitamin C 500 MG tablet  Commonly known as:  ASCORBIC ACID  Take 500 mg by mouth every morning.     Vitamin D 1000 UNITS capsule  Take 2,000 Units by mouth every morning.     warfarin 2.5 MG tablet  Commonly known as:  COUMADIN  PER MELISSA, RN @ SNF PT ON 4 MG 03/23/13        Meds ordered this encounter  Medications  . acetaminophen (TYLENOL) 500 MG tablet    Sig: Take 1,000 mg by mouth every 6 (six) hours as needed.    Immunization History  Administered Date(s) Administered  . Influenza Split 11/01/2011  . Influenza Whole 12/15/2006, 12/06/2007, 12/31/2009  . Influenza,inj,Quad PF,36+ Mos 11/01/2012  . Pneumococcal Polysaccharide-23 02/23/2004  . Td 02/22/2002    History  Substance Use Topics  . Smoking status: Never Smoker   . Smokeless tobacco: Never Used  . Alcohol Use: No    Filed Vitals:   03/27/13 1308  BP: 125/70  Pulse: 80  Temp: 97.8 F (36.6 C)  Resp: 18    Physical Exam  GENERAL APPEARANCE: Alert, conversant. Appropriately groomed. No acute distress.  HEENT: Unremarkable. RESPIRATORY: Breathing is even, unlabored. Lung sounds are clear   CARDIOVASCULAR: Heart RRR no murmurs, rubs or gallops. No peripheral edema.  GASTROINTESTINAL: Abdomen is soft, non-tender, not distended w/ normal bowel sounds.  NEUROLOGIC: Cranial nerves 2-12 grossly intact. Moves all extremities no tremor.  Patient Active Problem List   Diagnosis Date Noted  . S/P CABG x 2 03/26/2013  . S/P MVR (mitral valve repair) 03/26/2013  . S/P Maze operation for atrial fibrillation 03/26/2013  . Current use of long term anticoagulation 03/16/2013  . Chronic diastolic CHF (congestive heart failure) 03/12/2013  . Anemia- post op 03/12/2013  . Atrial fibrillation- back in AF with CVR 03/12/2013  . Pre-syncope 03/12/2013  . Hypotension, unspecified 03/12/2013  . Pleural effusion, bilateral 03/12/2013  .  Delirium 03/12/2013  . CAD - LIMA-LAD, SVG-OM Feb 29 2012 03/06/2013  . S/P mitral valve repair, maze procedure, and CABG x2 02/29/12 02/28/2013  . Unstable angina 02/26/2013  . PAF- pre and post op, now on Amiodarone 02/25/2013  . Paroxysmal atrial fibrillation 01/10/2011  . Special screening for malignant neoplasms, colon 10/05/2010  . Benign neoplasm of colon 10/05/2010  . Diverticulosis of colon (without mention of hemorrhage) 10/05/2010  . ADENOCARCINOMA, PROSTATE, GLEASON GRADE 3 08/13/2009  . SEBACEOUS CYST 08/13/2009  .  HYPOTHYROIDISM 08/02/2007  . GERD 08/02/2007  . OSTEOPENIA 08/02/2007  . UNS ADVRS EFF OTH RX MEDICINAL&BIOLOGICAL SBSTNC 08/02/2007  . HYPERLIPIDEMIA 12/15/2006  . RHINITIS, CHRONIC 12/15/2006  . PROSTATE SPECIFIC ANTIGEN, ELEVATED 12/15/2006  . HYPERTENSION 09/05/2006  . ARTHRITIS 09/05/2006  . COLONIC POLYPS, HX OF 09/05/2006    CBC    Component Value Date/Time   WBC 9.9 03/14/2013 0448   RBC 3.18* 03/14/2013 0448   HGB 9.8* 03/14/2013 0448   HCT 29.5* 03/14/2013 0448   PLT 345 03/14/2013 0448   MCV 92.8 03/14/2013 0448   LYMPHSABS 0.6* 03/12/2013 0945   MONOABS 0.8 03/12/2013 0945   EOSABS 0.2 03/12/2013 0945   BASOSABS 0.0 03/12/2013 0945    CMP     Component Value Date/Time   NA 135 03/23/2013 0935   K 4.2 03/23/2013 0935   CL 101 03/23/2013 0935   CO2 27 03/23/2013 0935   GLUCOSE 126* 03/23/2013 0935   BUN 16 03/23/2013 0935   CREATININE 1.1 03/23/2013 0935   CALCIUM 9.1 03/23/2013 0935   PROT 6.4 03/13/2013 0440   ALBUMIN 2.9* 03/13/2013 0440   AST 36 03/13/2013 0440   ALT 54* 03/13/2013 0440   ALKPHOS 93 03/13/2013 0440   BILITOT 0.7 03/13/2013 0440   GFRNONAA 78* 03/14/2013 0448   GFRAA >90 03/14/2013 0448    Assessment and Plan  Pt is being discharged to home in stable and improved condition. Secondary to recent nose bleeds pt's coumadin has been temporarily stopped by Cardiology. Pt is aware he needs follow up with ENT for continued  bleeds.    Hennie Duos, MD

## 2013-04-02 ENCOUNTER — Telehealth: Payer: Self-pay | Admitting: Internal Medicine

## 2013-04-02 NOTE — Telephone Encounter (Signed)
New message     Pt has had a nosebleed since sat 1-31 thru sun.  [facility] stopped coumadin and checked it on Sunday---INR was 1.6.  Pt had nosebleeds mon and tue after coumadin was stopped.  What do he do now---start coumadin on what dosage.?  Pt saw Richardson Dopp on last fri the 30th and changed it 5mg  daily.  This is when the bleeding started.  Pt is also on amiodarone.

## 2013-04-03 ENCOUNTER — Ambulatory Visit: Payer: Medicare HMO | Admitting: Family Medicine

## 2013-04-03 NOTE — Telephone Encounter (Signed)
Spoke with pt.  Had nosebleed on 1/31 and 2/1 after blowing his nose at the rehab center.  He stopped Coumadin on that  Sunday.   Pt states he thinks he was changed to 5mg  of Coumadin daily and this is what caused his nosebleeds.  He thinks his INR was 1.6 on 2/1.  No records of this information in chart.  Per pt, Dr. Roxy Manns said stop Coumadin for 1 week after his appt on 2/2 due to nosebleeds and his question is what to do now.  We have no records of any of this from [facility].  Explained to pt that we would have to obtain records then recommend appropriate dose at that time.  Also explained nosebleeds after blowing his nose are not uncommon and occur frequently while on Coumadin.

## 2013-04-04 ENCOUNTER — Ambulatory Visit (INDEPENDENT_AMBULATORY_CARE_PROVIDER_SITE_OTHER): Payer: Medicare HMO | Admitting: Family Medicine

## 2013-04-04 ENCOUNTER — Encounter: Payer: Self-pay | Admitting: Family Medicine

## 2013-04-04 VITALS — BP 120/68 | HR 69 | Temp 99.1°F | Ht 69.0 in | Wt 180.0 lb

## 2013-04-04 DIAGNOSIS — Z9889 Other specified postprocedural states: Secondary | ICD-10-CM

## 2013-04-04 DIAGNOSIS — Z8679 Personal history of other diseases of the circulatory system: Secondary | ICD-10-CM

## 2013-04-04 DIAGNOSIS — I10 Essential (primary) hypertension: Secondary | ICD-10-CM

## 2013-04-04 NOTE — Progress Notes (Signed)
   Subjective:    Patient ID: Luis Buba., male    DOB: August 14, 1927, 80 y.o.   MRN: [id number]  HPI Here to follow up after a recent surgery with some complications after that. On 02-28-13 he underwent a CABG combined with a mitral valve repair and a MAZE procedure per Dr. Darylene Price. The surgery actually went quite well, but after he went home he had periods of confusion and visual hallucinations that were felt to be delayed effects of general anesthesia. These have steadily improved since then, and he has had no hallucinations for several weeks now. He still has some periods of mild confusion however. Then he developed nosebleeds that were apparently due to his anticoagulation with Coumadin, so the Coumadin was stopped. He then had a syncopal episode while having a BM, and he was readmitted from 03-12-13 to 03-14-13. The workup was unremarkable then, and the syncopal spell was chalked up to a vasovagal episode. He was then sent to [facility] for rehab until 03-27-13, when he was able to return home. He saw Dr. Roxy Manns on 03-26-13, and he was pleased with his progress. He remains in atrial fibrillation, but his ventricular rates and BP have been well controlled. He remains on Amiodarone for now. He feels fine physically with no chest pain and no SOB.    Review of Systems  Constitutional: Negative.   Respiratory: Negative.   Cardiovascular: Negative.   Neurological: Negative.        Objective:   Physical Exam  Constitutional: He is oriented to person, place, and time. He appears well-developed and well-nourished. No distress.  Walks with a cane   Cardiovascular: Normal rate, normal heart sounds and intact distal pulses.  Exam reveals no gallop and no friction rub.   No murmur heard. Irregular rhythm   Pulmonary/Chest: Effort normal and breath sounds normal.  Neurological: He is alert and oriented to person, place, and time.          Assessment & Plan:  He seems to be recovering well  from his cardiac procedures, and he is to see Dr. Harrington Challenger again on 04-23-13. I asked him to speak to Dr. Harrington Challenger as to whether he will need long term anticoagulation. If so, possibly Plavix and aspirin would be options rather than going back on Coumadin. He will see [place] prn

## 2013-04-04 NOTE — Progress Notes (Signed)
Pre visit review using our clinic review tool, if applicable. No additional management support is needed unless otherwise documented below in the visit note. 

## 2013-04-05 ENCOUNTER — Telehealth: Payer: Self-pay | Admitting: Family Medicine

## 2013-04-05 NOTE — Telephone Encounter (Signed)
Relevant patient education assigned to patient using Emmi. ° °

## 2013-04-06 ENCOUNTER — Encounter: Payer: Self-pay | Admitting: Pharmacist

## 2013-04-06 MED ORDER — WARFARIN SODIUM 5 MG PO TABS: 5.0000 mg | ORAL_TABLET | Freq: Every day | ORAL | Status: DC

## 2013-04-11 NOTE — Telephone Encounter (Signed)
Received records from [facility].  1/23- INR 1.5 on 2.5mg  daily 1/26- INR 1.4- dose increased to 3mg  daily 1/28- INR 1.3-  Dose increased to 4mg  daily 1/30 no documented INR available but dose increased to 5mg  daily 2/1- INR 1.6  Discussed readings with pt.  Suggested restarting 5mg  daily and rechecking INR in 1 week.  He is agreeable.

## 2013-04-13 DIAGNOSIS — R269 Unspecified abnormalities of gait and mobility: Secondary | ICD-10-CM

## 2013-04-13 DIAGNOSIS — C61 Malignant neoplasm of prostate: Secondary | ICD-10-CM

## 2013-04-13 DIAGNOSIS — IMO0001 Reserved for inherently not codable concepts without codable children: Secondary | ICD-10-CM

## 2013-04-13 DIAGNOSIS — I509 Heart failure, unspecified: Secondary | ICD-10-CM

## 2013-04-16 ENCOUNTER — Ambulatory Visit (INDEPENDENT_AMBULATORY_CARE_PROVIDER_SITE_OTHER): Payer: Self-pay | Admitting: Thoracic Surgery (Cardiothoracic Vascular Surgery)

## 2013-04-16 VITALS — BP 119/72 | HR 72 | Resp 16 | Ht 69.0 in | Wt 179.0 lb

## 2013-04-16 DIAGNOSIS — Z9889 Other specified postprocedural states: Secondary | ICD-10-CM

## 2013-04-16 DIAGNOSIS — Z951 Presence of aortocoronary bypass graft: Secondary | ICD-10-CM

## 2013-04-16 DIAGNOSIS — Z8679 Personal history of other diseases of the circulatory system: Secondary | ICD-10-CM

## 2013-04-16 MED ORDER — NYSTATIN-TRIAMCINOLONE 100000-0.1 UNIT/GM-% EX CREA: 1.0000 "application " | TOPICAL_CREAM | Freq: Two times a day (BID) | CUTANEOUS | Status: DC

## 2013-04-16 NOTE — Progress Notes (Signed)
[address]       [place] [address]             [phone]     CARDIOTHORACIC SURGERY OFFICE NOTE  Referring Provider is Fay Records, MD PCP is [name], MD   HPI:  Patient returns for followup status post mitral valve repair, coronary artery bypass grafting x2, and Maze procedure on 02/28/2013. His initial postoperative recovery in the hospital was overall uncomplicated although somewhat slow. Although he was initially sinus rhythm early after surgery, he developed recurrent persistent rate controlled atrial fibrillation prior to hospital discharge. He was initially discharged home but subsequently was readmitted to the hospital 2 days later with a brief episode of dizziness, generalized weakness, and mild confusion. After a fairly quick recovery he was discharged again from the hospital initially to a local skilled nursing facility.  He was last seen here in our office on 03/26/2013, and since then he has gone back home.  Coumadin was stopped temporarily because of problems with nosebleeds, although recently he has been back on Coumadin again. Over the last few weeks the patient reports he has made good progress, but he developed a small rash involving the skin around the surgical incision just above the right knee from endoscopic vein harvest. He returns for office today to have this skin rash checked. He has not had any fevers or chills. He reports that he has been walking a regular basis and making good progress. He has not had any dizzy spells or tachypalpitations. His appetite is fairly good.   Current Outpatient Prescriptions  Medication Sig Dispense Refill  . aspirin 81 MG tablet Take 1 tablet (81 mg total) by mouth daily.      [name] atorvastatin (LIPITOR) 10 MG tablet Take 10 mg by mouth daily. At 6pm      . Cholecalciferol (VITAMIN D) 1000 UNITS capsule Take 2,000 Units by mouth every morning.       . fish oil-omega-3 fatty acids 1000 MG capsule Take 1  g by mouth every morning.       . furosemide (LASIX) 40 MG tablet Take 40 mg by mouth daily.      [name] levothyroxine (SYNTHROID, LEVOTHROID) 50 MCG tablet Take 1 tablet (50 mcg total) by mouth daily.  90 tablet  3  . metoprolol tartrate (LOPRESSOR) 25 MG tablet Take 1 tablet (25 mg total) by mouth 2 (two) times daily.      . potassium chloride (K-DUR) 10 MEQ tablet Take 10 mEq by mouth daily.      . vitamin C (ASCORBIC ACID) 500 MG tablet Take 500 mg by mouth every morning.       . warfarin (COUMADIN) 5 MG tablet Take 1 tablet (5 mg total) by mouth daily.  30 tablet  1  . amiodarone (PACERONE) 200 MG tablet Take 200 mg by mouth daily.       [name] nystatin-triamcinolone (MYCOLOG II) cream Apply 1 application topically 2 (two) times daily.  30 g  0   No current facility-administered medications for this visit.      Physical Exam:   BP 119/72  Pulse 72  Resp 16  Ht 5\' 9"  (1.753 m)  Wt 179 lb (81.194 kg)  BMI 26.42 kg/m2  SpO2 98%  General:  Well-appearing  Chest:   Clear to auscultation  CV:   Regular rate and rhythm without murmur  Incisions:  Clean and dry and healing nicely, sternum is  stable. There is a dry scaly erythematous rash extending several centimeters proximal to and anterior to the small incision just above the right knee from endoscopic vein harvest. This does not appear to be cellulitis and there is no associated swelling, but rather characteristics of the rash are more suggestive of topical yeast infection.  Abdomen:  Soft nontender  Extremities:  Warm and well-perfused with no lower extremity edema  Diagnostic Tests:  n/a   Impression:  Possible cutaneous yeast infection involving the skin around the small surgical incision just above the right knee from endoscopic vein harvest. There is no sign of cellulitis or deep soft tissue infection.  Plan:  Topical nystatin cream for one week. The patient will return if this does not resolve the problem. He will otherwise  return in 4 weeks for routine followup and rhythm check.   Valentina Gu. Roxy Manns, MD 04/16/2013 1:12 PM

## 2013-04-16 NOTE — Patient Instructions (Signed)
Apply Nystatin ointment to rash twice daily x 7 days  Call and return to see [place] if rash gets worse or you develop increased swelling, redness or drainage

## 2013-04-19 ENCOUNTER — Ambulatory Visit (HOSPITAL_COMMUNITY): Payer: Medicare HMO

## 2013-04-23 ENCOUNTER — Encounter (HOSPITAL_COMMUNITY): Payer: Medicare HMO

## 2013-04-23 ENCOUNTER — Encounter: Payer: Self-pay | Admitting: Internal Medicine

## 2013-04-23 ENCOUNTER — Ambulatory Visit (INDEPENDENT_AMBULATORY_CARE_PROVIDER_SITE_OTHER): Payer: Medicare HMO | Admitting: Pharmacist

## 2013-04-23 ENCOUNTER — Ambulatory Visit (INDEPENDENT_AMBULATORY_CARE_PROVIDER_SITE_OTHER): Payer: Medicare HMO | Admitting: Internal Medicine

## 2013-04-23 VITALS — BP 124/71 | HR 73 | Ht 69.0 in | Wt 179.0 lb

## 2013-04-23 DIAGNOSIS — Z9889 Other specified postprocedural states: Secondary | ICD-10-CM

## 2013-04-23 DIAGNOSIS — I4891 Unspecified atrial fibrillation: Secondary | ICD-10-CM

## 2013-04-23 LAB — CBC
HCT: 38.2 % — ABNORMAL LOW (ref 39.0–52.0)
Hemoglobin: 12.4 g/dL — ABNORMAL LOW (ref 13.0–17.0)
MCHC: 32.4 g/dL (ref 30.0–36.0)
MCV: 91 fl (ref 78.0–100.0)
PLATELETS: 255 10*3/uL (ref 150.0–400.0)
RBC: 4.2 Mil/uL — AB (ref 4.22–5.81)
RDW: 13.6 % (ref 11.5–14.6)
WBC: 6.8 10*3/uL (ref 4.5–10.5)

## 2013-04-23 LAB — BASIC METABOLIC PANEL
BUN: 20 mg/dL (ref 6–23)
CO2: 30 mEq/L (ref 19–32)
Calcium: 9 mg/dL (ref 8.4–10.5)
Chloride: 102 mEq/L (ref 96–112)
Creatinine, Ser: 1 mg/dL (ref 0.4–1.5)
GFR: 74.03 mL/min (ref >60.00)
GLUCOSE: 92 mg/dL (ref 70–99)
POTASSIUM: 3.9 meq/L (ref 3.5–5.1)
Sodium: 137 mEq/L (ref 135–145)

## 2013-04-23 LAB — POCT INR: INR: 3.3

## 2013-04-23 NOTE — Patient Instructions (Signed)
Your physician recommends that you return for lab work today for Bmet and CBC.  Your physician recommends that you continue on your current medications as directed. Please refer to the Current Medication list given to you today.  Your physician recommends that you follow up as scheduled.

## 2013-04-24 NOTE — Progress Notes (Signed)
HPI [name] is an 80 yo who I saw in September 2014 for dyspnea and fatigue  Hx of afib (not on coumadin)  Echo done.  Normal LV function  Mild MR  Holter with atrial fibrillation  dyspnea and fatuigue  No CP  Occasional shoulder pain Underwent L heart cath.  Ths showed.  Calcified L main; LAD with 80 to 90% mid LAD; D1 70 to 80%; D2 99%; ; LCx 95 to 99% prox  AV groove artery gives collaterals to R.  RCA100%  R to R collaterals   The patient underwent CABG (LIMA to LAD, SVG to OM1) with MVRepair and Maze procedure.  He was just seen by Remus Loffler on 2/23.    SInce seen he denies palpitations.  One episode of double vision about 1 month ago that lasted less than a min  No other neurologic complaints Soreness is improving.  Breathing is OK     :  No Known Allergies  Current Outpatient Prescriptions  Medication Sig Dispense Refill  . aspirin 81 MG tablet Take 1 tablet (81 mg total) by mouth daily.      [name] atorvastatin (LIPITOR) 10 MG tablet Take 10 mg by mouth daily. At 6pm      . Cholecalciferol (VITAMIN D) 1000 UNITS capsule Take 2,000 Units by mouth every morning.       . fish oil-omega-3 fatty acids 1000 MG capsule Take 1 g by mouth every morning.       . furosemide (LASIX) 40 MG tablet Take 40 mg by mouth daily.      [name] levothyroxine (SYNTHROID, LEVOTHROID) 50 MCG tablet Take 1 tablet (50 mcg total) by mouth daily.  90 tablet  3  . metoprolol tartrate (LOPRESSOR) 25 MG tablet Take 1 tablet (25 mg total) by mouth 2 (two) times daily.      [name] nystatin-triamcinolone (MYCOLOG II) cream Apply 1 application topically 2 (two) times daily.  30 g  0  . potassium chloride (K-DUR) 10 MEQ tablet Take 10 mEq by mouth daily.      . vitamin C (ASCORBIC ACID) 500 MG tablet Take 500 mg by mouth every morning.       . warfarin (COUMADIN) 5 MG tablet Take 1 tablet (5 mg total) by mouth daily.  30 tablet  1  . amiodarone (PACERONE) 200 MG tablet Take 200 mg by mouth daily.        No current facility-administered  medications for this visit.    Past Medical History  Diagnosis Date  . Personal history of colonic polyps     tubular adenoma  . Hypertension   . Irritable bowel syndrome   . Prostate cancer 1991    sees Dr. Carlota Raspberry at [facility], observation only   . Atrial fibrillation   . S/P mitral valve repair, maze procedure, and CABG x2 02/28/2013    Complex valvuloplasty including triangular resection of posterior leaflet, 30 mm Sorin Memo 3D ring annuloplasty  . S/P CABG x 2 02/28/2013    LIMA to LAD, SVG to OM1, EVH via right thigh  . S/P Maze operation for atrial fibrillation 02/28/2013    Complete bilateral atrial lesion set using bipolar radiofrequency and cryothermy ablation with clipping of LA appendage  . Pleural effusion, bilateral 03/12/2013    Small to moderate, L>R  . Complication of anesthesia     "serious cognisance problems since my OR in January" (03/13/2013  . Coronary artery disease     Past Surgical History  Procedure  Laterality Date  . Hemorrhoid surgery    . Prostate biopsy    . Colonoscopy  2011    per Dr. Sharlett Iles, benign polyps, no repeats needed   . Cataract extraction, bilateral Bilateral 2014    per Dr. Gershon Crane   . Coronary artery bypass graft N/A 02/28/2013    Procedure: CORONARY ARTERY BYPASS GRAFTING (CABG);  Surgeon: Rexene Alberts, MD;  Location: [facility];  Service: Open Heart Surgery;  Laterality: N/A;  CABG x 2,  using left internal mammary artery and right leg saphenous vein harvested endoscopically  . Intraoperative transesophageal echocardiogram N/A 02/28/2013    Procedure: INTRAOPERATIVE TRANSESOPHAGEAL ECHOCARDIOGRAM;  Surgeon: Rexene Alberts, MD;  Location: [facility];  Service: Open Heart Surgery;  Laterality: N/A;  . Maze N/A 02/28/2013    Procedure: MAZE;  Surgeon: Rexene Alberts, MD;  Location: [facility];  Service: Open Heart Surgery;  Laterality: N/A;  . Mitral valve repair N/A 02/28/2013    Procedure: MITRAL VALVE REPAIR (MVR);  Surgeon: Rexene Alberts, MD;   Location: [facility];  Service: Open Heart Surgery;  Laterality: N/A;  . Cardiac catheterization      Family History  Problem Relation Age of Onset  . Diabetes    . Hypertension    . Lung cancer Brother   . Sudden death    . Heart disease    . Stroke    . Heart disease Son   . Stomach cancer Brother     History   Social History  . Marital Status: Married    Spouse Name: N/A    Number of Children: 3  . Years of Education: N/A   Occupational History  . Retired    Social History Main Topics  . Smoking status: Never Smoker   . Smokeless tobacco: Never Used  . Alcohol Use: No  . Drug Use: No  . Sexual Activity: No   Other Topics Concern  . Not on file   Social History Narrative  . No narrative on file    Review of Systems:  All systems reviewed.  They are negative to the above problem except as previously stated.  Vital Signs: BP 124/71  Pulse 73  Ht 5\' 9"  (1.753 m)  Wt 179 lb (81.194 kg)  BMI 26.42 kg/m2  Physical Exam Patient is in NAD HEENT:  Normocephalic, atraumatic. EOMI, PERRLA.  Neck: JVP is normal.  No bruits.  Lungs: clear to auscultation. No rales no wheezes.  Chest :  INcision is healing well Heart: Regular rate and rhythm. Normal S1, S2. No S3.   No significant murmurs. PMI not displaced.  Abdomen:  Supple, nontender. Normal bowel sounds. No masses. No hepatomegaly.  Extremities:   Good distal pulses throughout. No lower extremity edema.  Musculoskeletal :moving all extremities.  Neuro:   alert and oriented x3.  CN II-XII grossly intact.  Assessment and Plan:  1.  CAD  REcovering well from recent CABG  2.  Atrial fib.  S/p surgical MAZE  I would kieep on low dose amiodarone and coumadin.  Eventual  taper off meds  3.  HL  Keep on statin  Follow.    Will set to see the patient in May

## 2013-04-25 ENCOUNTER — Encounter (HOSPITAL_COMMUNITY): Payer: Medicare HMO

## 2013-04-26 ENCOUNTER — Encounter (HOSPITAL_COMMUNITY)
Admission: RE | Admit: 2013-04-26 | Discharge: 2013-04-26 | Disposition: A | Discharge status: Home / Self Care | Payer: Medicare HMO | Source: Ambulatory Visit | Attending: Internal Medicine | Admitting: Internal Medicine

## 2013-04-26 DIAGNOSIS — Z5189 Encounter for other specified aftercare: Secondary | ICD-10-CM | POA: Insufficient documentation

## 2013-04-26 DIAGNOSIS — I10 Essential (primary) hypertension: Secondary | ICD-10-CM | POA: Insufficient documentation

## 2013-04-26 DIAGNOSIS — I509 Heart failure, unspecified: Secondary | ICD-10-CM | POA: Insufficient documentation

## 2013-04-26 DIAGNOSIS — Z951 Presence of aortocoronary bypass graft: Secondary | ICD-10-CM | POA: Insufficient documentation

## 2013-04-26 DIAGNOSIS — Z9889 Other specified postprocedural states: Secondary | ICD-10-CM | POA: Insufficient documentation

## 2013-04-26 DIAGNOSIS — I4891 Unspecified atrial fibrillation: Secondary | ICD-10-CM | POA: Insufficient documentation

## 2013-04-26 NOTE — Progress Notes (Signed)
Cardiac Rehab Medication Review by a Pharmacist  Does the patient  feel that his/her medications are working for him/her?  yes  Has the patient been experiencing any side effects to the medications prescribed?  yes  Does the patient measure his/her own blood pressure or blood glucose at home?  Not regularly, checks it maybe once a week.   Does the patient have any problems obtaining medications due to transportation or finances?   no  Understanding of regimen: excellent Understanding of indications: excellent Potential of compliance: good  Angelica Chessman 04/26/2013 8:15 AM

## 2013-04-27 ENCOUNTER — Encounter (HOSPITAL_COMMUNITY): Payer: Medicare HMO

## 2013-04-30 ENCOUNTER — Encounter (HOSPITAL_COMMUNITY)
Admission: RE | Admit: 2013-04-30 | Discharge: 2013-04-30 | Disposition: A | Discharge status: Home / Self Care | Payer: Medicare HMO | Source: Ambulatory Visit | Attending: Internal Medicine | Admitting: Internal Medicine

## 2013-04-30 ENCOUNTER — Ambulatory Visit (INDEPENDENT_AMBULATORY_CARE_PROVIDER_SITE_OTHER): Payer: Medicare HMO | Admitting: *Deleted

## 2013-04-30 DIAGNOSIS — Z9889 Other specified postprocedural states: Secondary | ICD-10-CM

## 2013-04-30 DIAGNOSIS — I4891 Unspecified atrial fibrillation: Secondary | ICD-10-CM

## 2013-04-30 LAB — POCT INR: INR: 4.5

## 2013-04-30 NOTE — Progress Notes (Signed)
Pt in today for his first day of exercise in the Cardiac Rehab phase II program.  Pt tolerated light exercise with no complaints.  Pt used cane to assist with ambulation.  Pt took one rest break in between 2nd third station and ate a peanut butter and jelly sandwich.  Monitor showed Sr with first degree block and occasional pacs.  Pt rate is regular and he is on coumadin and is finishing ammiodorone.  PHQ2 score 0. Continue to monitor.

## 2013-05-02 ENCOUNTER — Encounter (HOSPITAL_COMMUNITY)
Admission: RE | Admit: 2013-05-02 | Discharge: 2013-05-02 | Disposition: A | Discharge status: Home / Self Care | Payer: Medicare HMO | Source: Ambulatory Visit | Attending: Internal Medicine | Admitting: Internal Medicine

## 2013-05-04 ENCOUNTER — Encounter (HOSPITAL_COMMUNITY): Payer: Medicare HMO

## 2013-05-07 ENCOUNTER — Encounter (HOSPITAL_COMMUNITY)
Admission: RE | Admit: 2013-05-07 | Discharge: 2013-05-07 | Disposition: A | Discharge status: Home / Self Care | Payer: Medicare HMO | Source: Ambulatory Visit | Attending: Internal Medicine | Admitting: Internal Medicine

## 2013-05-07 ENCOUNTER — Ambulatory Visit (INDEPENDENT_AMBULATORY_CARE_PROVIDER_SITE_OTHER): Payer: Medicare HMO | Admitting: General Practice

## 2013-05-07 DIAGNOSIS — I4891 Unspecified atrial fibrillation: Secondary | ICD-10-CM

## 2013-05-07 DIAGNOSIS — Z5181 Encounter for therapeutic drug level monitoring: Secondary | ICD-10-CM | POA: Insufficient documentation

## 2013-05-07 DIAGNOSIS — Z9889 Other specified postprocedural states: Secondary | ICD-10-CM

## 2013-05-07 LAB — POCT INR: INR: 2.6

## 2013-05-07 NOTE — Progress Notes (Signed)
Due to insurance copays, pt plans to graduate early on Friday 3/20.  Reviewed home exercise with pt today.  Pt plans to join [employer] program and walking at home for exercise.  Reviewed THR, pulse, RPE, sign and symptoms, and when to call 911 or MD.  Pt voiced understanding. Alberteen Sam, MA, ACSM RCEP

## 2013-05-07 NOTE — Progress Notes (Signed)
Pre visit review using our clinic review tool, if applicable. No additional management support is needed unless otherwise documented below in the visit note. 

## 2013-05-09 ENCOUNTER — Encounter (HOSPITAL_COMMUNITY)
Admission: RE | Admit: 2013-05-09 | Discharge: 2013-05-09 | Disposition: A | Discharge status: Home / Self Care | Payer: Medicare HMO | Source: Ambulatory Visit | Attending: Internal Medicine | Admitting: Internal Medicine

## 2013-05-11 ENCOUNTER — Encounter (HOSPITAL_COMMUNITY)
Admission: RE | Admit: 2013-05-11 | Discharge: 2013-05-11 | Disposition: A | Discharge status: Home / Self Care | Payer: Medicare HMO | Source: Ambulatory Visit | Attending: Internal Medicine | Admitting: Internal Medicine

## 2013-05-11 ENCOUNTER — Other Ambulatory Visit: Payer: Self-pay | Admitting: *Deleted

## 2013-05-11 DIAGNOSIS — I10 Essential (primary) hypertension: Secondary | ICD-10-CM

## 2013-05-11 MED ORDER — LEVOTHYROXINE SODIUM 50 MCG PO TABS: 50.0000 ug | ORAL_TABLET | Freq: Every day | ORAL | Status: DC

## 2013-05-11 MED ORDER — METOPROLOL TARTRATE 25 MG PO TABS: 25.0000 mg | ORAL_TABLET | Freq: Two times a day (BID) | ORAL | Status: DC

## 2013-05-11 MED ORDER — POTASSIUM CHLORIDE ER 10 MEQ PO TBCR: 10.0000 meq | EXTENDED_RELEASE_TABLET | Freq: Every day | ORAL | Status: DC

## 2013-05-11 MED ORDER — ATORVASTATIN CALCIUM 10 MG PO TABS: 10.0000 mg | ORAL_TABLET | Freq: Every day | ORAL | Status: DC

## 2013-05-11 MED ORDER — FUROSEMIDE 40 MG PO TABS: 40.0000 mg | ORAL_TABLET | Freq: Every day | ORAL | Status: DC

## 2013-05-11 NOTE — Progress Notes (Signed)
[name]. 80 y.o. male Nutrition Note Spoke with pt.  Nutrition Survey reviewed with pt. Pt is not currently following the Therapeutic Lifestyle Changes diet. Age-appropriate nutrition discussed.  Low sodium intake for CHF discussed. Pt is prediabetic according to pt's last A1c. Pre-diabetes discussed. Pt is on Coumadin and is following a diet Consistent in Vitamin K intake. Pt continues to be anemic. Foods high in iron reviewed. Pt expressed understanding of the information reviewed. Pt aware of nutrition education classes offered.  Nutrition Diagnosis   Food-and nutrition-related knowledge deficit related to lack of exposure to information as related to diagnosis of: ? CVD ? Pre-DM (A1c 5.7) ?  Nutrition RX/ Estimated Daily Nutrition Needs for: wt maintenance 2150-2450 Kcal, 70-80 gm fat, 14-16 gm sat fat, 2.1-2.5 gm trans-fat, <1500 mg sodium  Nutrition Intervention   Pt's individual nutrition plan reviewed with pt.   Benefits of adopting Therapeutic Lifestyle Changes discussed when Medficts reviewed.   Pt to attend the Portion Distortion class   Pt given handouts for: ? Nutrition I class ? Nutrition II class   Continue client-centered nutrition education by RD, as part of interdisciplinary care. Goal(s)   Pt to describe the benefit of including fruits, vegetables, whole grains, and low-fat dairy products in a heart healthy meal plan. Monitor and Evaluate progress toward nutrition goal with team. Nutrition Risk: Change to Moderate [name], M.Ed, RD, LDN, CDE 05/11/2013 11:36 AM

## 2013-05-11 NOTE — Telephone Encounter (Signed)
Rx sent to pharmacy   

## 2013-05-11 NOTE — Progress Notes (Signed)
[name] graduates today and plans to continues exercise on his own due to [employer] co pay.

## 2013-05-14 ENCOUNTER — Ambulatory Visit (INDEPENDENT_AMBULATORY_CARE_PROVIDER_SITE_OTHER): Payer: Self-pay | Admitting: Thoracic Surgery (Cardiothoracic Vascular Surgery)

## 2013-05-14 ENCOUNTER — Ambulatory Visit (INDEPENDENT_AMBULATORY_CARE_PROVIDER_SITE_OTHER): Payer: Medicare HMO | Admitting: General Practice

## 2013-05-14 ENCOUNTER — Encounter (HOSPITAL_COMMUNITY): Payer: Medicare HMO

## 2013-05-14 ENCOUNTER — Encounter: Payer: Self-pay | Admitting: Thoracic Surgery (Cardiothoracic Vascular Surgery)

## 2013-05-14 VITALS — BP 112/64 | HR 66 | Resp 20 | Ht 69.0 in | Wt 183.0 lb

## 2013-05-14 DIAGNOSIS — Z5181 Encounter for therapeutic drug level monitoring: Secondary | ICD-10-CM

## 2013-05-14 DIAGNOSIS — Z9889 Other specified postprocedural states: Secondary | ICD-10-CM

## 2013-05-14 DIAGNOSIS — Z951 Presence of aortocoronary bypass graft: Secondary | ICD-10-CM

## 2013-05-14 DIAGNOSIS — Z8679 Personal history of other diseases of the circulatory system: Secondary | ICD-10-CM

## 2013-05-14 DIAGNOSIS — I2510 Atherosclerotic heart disease of native coronary artery without angina pectoris: Secondary | ICD-10-CM

## 2013-05-14 DIAGNOSIS — I4891 Unspecified atrial fibrillation: Secondary | ICD-10-CM

## 2013-05-14 LAB — POCT INR: INR: 2.5

## 2013-05-14 NOTE — Progress Notes (Signed)
Pre visit review using our clinic review tool, if applicable. No additional management support is needed unless otherwise documented below in the visit note. 

## 2013-05-14 NOTE — Progress Notes (Signed)
[address]       [place] [address]             [phone]     CARDIOTHORACIC SURGERY OFFICE NOTE  Referring Provider is Fay Records, MD PCP is [name], MD   HPI:  Patient returns for followup status post mitral valve repair, coronary artery bypass grafting x2, and Maze procedure on 02/28/2013.  He was last seen here in the office on 04/16/2013. Since then he has made excellent progress. He was seen in followup by Dr. Harrington Challenger on 04/23/2013.  He returns to the office today reporting that overall he feels quite well. However, he did have a transient episode of double vision last week, similar to the episode he had back in February. He otherwise has done remarkably well. He has no residual soreness in his chest. His exercise tolerance continues to improve and he is going to the gym regularly participating in the [employer] exercise program.  He denies any tachypalpitations or dizzy spells. He continues to get his Coumadin dose adjusted through the Coumadin clinic. He hopes to be taken off of amiodarone.    Current Outpatient Prescriptions  Medication Sig Dispense Refill  . amiodarone (PACERONE) 200 MG tablet Take 200 mg by mouth daily. Per Dr. Harrington Challenger patient stated he will stop taking 04/30/13.      [name] aspirin 81 MG tablet Take 81 mg by mouth daily.      [name] atorvastatin (LIPITOR) 10 MG tablet Take 1 tablet (10 mg total) by mouth daily. At 6pm  90 tablet  1  . Cholecalciferol (VITAMIN D) 1000 UNITS capsule Take 2,000 Units by mouth every morning.       . fish oil-omega-3 fatty acids 1000 MG capsule Take 1 g by mouth every morning.       . furosemide (LASIX) 40 MG tablet Take 1 tablet (40 mg total) by mouth daily.  90 tablet  1  . levothyroxine (SYNTHROID, LEVOTHROID) 50 MCG tablet Take 1 tablet (50 mcg total) by mouth daily.  90 tablet  3  . metoprolol tartrate (LOPRESSOR) 25 MG tablet Take 1 tablet (25 mg total) by mouth 2 (two) times daily.  180 tablet  1  .  potassium chloride (K-DUR) 10 MEQ tablet Take 1 tablet (10 mEq total) by mouth daily.  90 tablet  1  . vitamin C (ASCORBIC ACID) 500 MG tablet Take 500 mg by mouth every morning.       . warfarin (COUMADIN) 5 MG tablet Take 1 tablet (5 mg total) by mouth daily.  30 tablet  1   No current facility-administered medications for this visit.      Physical Exam:   BP 112/64  Pulse 66  Resp 20  Ht 5\' 9"  (1.753 m)  Wt 183 lb (83.008 kg)  BMI 27.01 kg/m2  SpO2 98%  General:  Well-appearing  Chest:   Clear to auscultation  CV:   Regular rate and rhythm without murmur  Incisions:  Well-healed, sternum is stable  Abdomen:  Soft nontender  Extremities:  Warm and well-perfused. Previous rash on right lower extremity has completely resolved  Diagnostic Tests:  2 channel telemetry rhythm strip demonstrates normal sinus rhythm   Impression:  Patient is doing well more than 2-1/2 months status post mitral valve repair, coronary artery bypass grafting x2, and Maze procedure. He is maintaining sinus rhythm. He is therapeutic on Coumadin. He did have a second episode of double  vision last week that was very brief, lasting only a few seconds. He is asking to be taken off of amiodarone.  Plan:  I've instructed him to go ahead and stop taking amiodarone. If he continues to have transient visual disturbances he may need a neurologic workup, potentially to include a repeat echocardiogram if not a TEE.  We have not made any other changes in his current medications. I've encouraged him to continue to gradually increase his physical activity as tolerated. He will return in 4 months for routine followup and rhythm check.   Valentina Gu. Roxy Manns, MD 05/14/2013 1:55 PM

## 2013-05-14 NOTE — Patient Instructions (Signed)
Stop taking amiodarone 

## 2013-05-15 ENCOUNTER — Telehealth: Payer: Self-pay | Admitting: Family Medicine

## 2013-05-15 NOTE — Telephone Encounter (Signed)
[id number] - [place], [place] [address] requesting refill of warfarin (COUMADIN) 5 MG tablet

## 2013-05-16 ENCOUNTER — Other Ambulatory Visit: Payer: Self-pay | Admitting: General Practice

## 2013-05-16 ENCOUNTER — Encounter (HOSPITAL_COMMUNITY): Payer: Medicare HMO

## 2013-05-16 MED ORDER — WARFARIN SODIUM 5 MG PO TABS: ORAL_TABLET | ORAL | Status: DC

## 2013-05-18 ENCOUNTER — Encounter (HOSPITAL_COMMUNITY): Payer: Medicare HMO

## 2013-05-21 ENCOUNTER — Encounter (HOSPITAL_COMMUNITY): Payer: Medicare HMO

## 2013-05-23 ENCOUNTER — Encounter (HOSPITAL_COMMUNITY): Payer: Medicare HMO

## 2013-05-25 ENCOUNTER — Encounter (HOSPITAL_COMMUNITY): Payer: Medicare HMO

## 2013-05-28 ENCOUNTER — Encounter (HOSPITAL_COMMUNITY): Payer: Medicare HMO

## 2013-05-29 ENCOUNTER — Ambulatory Visit (INDEPENDENT_AMBULATORY_CARE_PROVIDER_SITE_OTHER): Payer: Medicare HMO | Admitting: General Practice

## 2013-05-29 DIAGNOSIS — Z9889 Other specified postprocedural states: Secondary | ICD-10-CM

## 2013-05-29 DIAGNOSIS — Z5181 Encounter for therapeutic drug level monitoring: Secondary | ICD-10-CM

## 2013-05-29 DIAGNOSIS — I4891 Unspecified atrial fibrillation: Secondary | ICD-10-CM

## 2013-05-29 LAB — POCT INR: INR: 2

## 2013-05-29 NOTE — Progress Notes (Signed)
Pre visit review using our clinic review tool, if applicable. No additional management support is needed unless otherwise documented below in the visit note. 

## 2013-05-30 ENCOUNTER — Encounter (HOSPITAL_COMMUNITY): Payer: Medicare HMO

## 2013-06-01 ENCOUNTER — Encounter (HOSPITAL_COMMUNITY): Payer: Medicare HMO

## 2013-06-04 ENCOUNTER — Ambulatory Visit: Payer: Medicare HMO | Admitting: Internal Medicine

## 2013-06-04 ENCOUNTER — Ambulatory Visit: Payer: Medicare HMO | Admitting: Thoracic Surgery (Cardiothoracic Vascular Surgery)

## 2013-06-04 ENCOUNTER — Encounter (HOSPITAL_COMMUNITY): Payer: Medicare HMO

## 2013-06-06 ENCOUNTER — Encounter (HOSPITAL_COMMUNITY): Payer: Medicare HMO

## 2013-06-08 ENCOUNTER — Encounter (HOSPITAL_COMMUNITY): Payer: Medicare HMO

## 2013-06-11 ENCOUNTER — Encounter (HOSPITAL_COMMUNITY): Payer: Medicare HMO

## 2013-06-13 ENCOUNTER — Encounter (HOSPITAL_COMMUNITY): Payer: Medicare HMO

## 2013-06-15 ENCOUNTER — Encounter: Payer: Self-pay | Admitting: Internal Medicine

## 2013-06-15 ENCOUNTER — Ambulatory Visit (INDEPENDENT_AMBULATORY_CARE_PROVIDER_SITE_OTHER): Payer: Medicare HMO | Admitting: Internal Medicine

## 2013-06-15 ENCOUNTER — Encounter (HOSPITAL_COMMUNITY): Payer: Medicare HMO

## 2013-06-15 VITALS — BP 110/65 | HR 72 | Ht 69.0 in | Wt 183.0 lb

## 2013-06-15 DIAGNOSIS — Z79899 Other long term (current) drug therapy: Secondary | ICD-10-CM

## 2013-06-15 DIAGNOSIS — Z5181 Encounter for therapeutic drug level monitoring: Secondary | ICD-10-CM

## 2013-06-15 DIAGNOSIS — I4891 Unspecified atrial fibrillation: Secondary | ICD-10-CM

## 2013-06-15 DIAGNOSIS — Z951 Presence of aortocoronary bypass graft: Secondary | ICD-10-CM

## 2013-06-15 DIAGNOSIS — I2510 Atherosclerotic heart disease of native coronary artery without angina pectoris: Secondary | ICD-10-CM

## 2013-06-15 DIAGNOSIS — Z8679 Personal history of other diseases of the circulatory system: Secondary | ICD-10-CM

## 2013-06-15 DIAGNOSIS — Z9889 Other specified postprocedural states: Secondary | ICD-10-CM

## 2013-06-15 NOTE — Patient Instructions (Signed)
Your physician recommends that you continue on your current medications as directed. Please refer to the Current Medication list given to you today.  Your physician has requested that you have an echocardiogram. Echocardiography is a painless test that uses sound waves to create images of your heart. It provides your doctor with information about the size and shape of your heart and how well your heart's chambers and valves are working. This procedure takes approximately one hour. There are no restrictions for this procedure.  Your physician recommends that you return for lab work ON SAME DAY AS YOU COME IN FOR ECHOCARDIOGRAM. (CBC, BMET, LIPID, AST)

## 2013-06-15 NOTE — Progress Notes (Addendum)
HPI [name] is an 80 yo who was seen in September 2014 for dyspnea and fatigue  Hx of afib (not on coumadin)  Echo done.  Normal LV function  Mild MR  Holter with atrial fibrillation  dyspnea and fatuigue  No CP  Occasional shoulder pain Underwent L heart cath.  Ths showed.  Calcified L main; LAD with 80 to 90% mid LAD; D1 70 to 80%; D2 99%; ; LCx 95 to 99% prox  AV groove artery gives collaterals to R.  RCA100%  R to R collaterals   The patient underwent CABG (LIMA to LAD, SVG to OM1) with MVRepair and Maze procedure.  He is recovering well  Breathing is OK  No palpitations.  No dizziness He is no longer in cardiac rehab but going to [facility].    :  No Known Allergies  Current Outpatient Prescriptions  Medication Sig Dispense Refill  . amiodarone (PACERONE) 200 MG tablet Take 200 mg by mouth daily. Per Dr. Harrington Challenger patient stated he will stop taking 04/30/13.      [name] aspirin 81 MG tablet Take 81 mg by mouth daily.      [name] atorvastatin (LIPITOR) 10 MG tablet Take 1 tablet (10 mg total) by mouth daily. At 6pm  90 tablet  1  . Cholecalciferol (VITAMIN D) 1000 UNITS capsule Take 2,000 Units by mouth every morning.       . fish oil-omega-3 fatty acids 1000 MG capsule Take 1 g by mouth every morning.       . furosemide (LASIX) 40 MG tablet Take 1 tablet (40 mg total) by mouth daily.  90 tablet  1  . levothyroxine (SYNTHROID, LEVOTHROID) 50 MCG tablet Take 1 tablet (50 mcg total) by mouth daily.  90 tablet  3  . metoprolol tartrate (LOPRESSOR) 25 MG tablet Take 1 tablet (25 mg total) by mouth 2 (two) times daily.  180 tablet  1  . potassium chloride (K-DUR) 10 MEQ tablet Take 1 tablet (10 mEq total) by mouth daily.  90 tablet  1  . vitamin C (ASCORBIC ACID) 500 MG tablet Take 500 mg by mouth every morning.       . warfarin (COUMADIN) 5 MG tablet Take as directed by anticoagulation clinic  30 tablet  3   No current facility-administered medications for this visit.    Past Medical History   Diagnosis Date  . Personal history of colonic polyps     tubular adenoma  . Hypertension   . Irritable bowel syndrome   . Prostate cancer 1991    sees Dr. Carlota Raspberry at [facility], observation only   . Atrial fibrillation   . S/P mitral valve repair, maze procedure, and CABG x2 02/28/2013    Complex valvuloplasty including triangular resection of posterior leaflet, 30 mm Sorin Memo 3D ring annuloplasty  . S/P CABG x 2 02/28/2013    LIMA to LAD, SVG to OM1, EVH via right thigh  . S/P Maze operation for atrial fibrillation 02/28/2013    Complete bilateral atrial lesion set using bipolar radiofrequency and cryothermy ablation with clipping of LA appendage  . Pleural effusion, bilateral 03/12/2013    Small to moderate, L>R  . Complication of anesthesia     "serious cognisance problems since my OR in January" (03/13/2013  . Coronary artery disease     Past Surgical History  Procedure Laterality Date  . Hemorrhoid surgery    . Prostate biopsy    . Colonoscopy  2011    per  Dr. Sharlett Iles, benign polyps, no repeats needed   . Cataract extraction, bilateral Bilateral 2014    per Dr. Gershon Crane   . Coronary artery bypass graft N/A 02/28/2013    Procedure: CORONARY ARTERY BYPASS GRAFTING (CABG);  Surgeon: Rexene Alberts, MD;  Location: [facility];  Service: Open Heart Surgery;  Laterality: N/A;  CABG x 2,  using left internal mammary artery and right leg saphenous vein harvested endoscopically  . Intraoperative transesophageal echocardiogram N/A 02/28/2013    Procedure: INTRAOPERATIVE TRANSESOPHAGEAL ECHOCARDIOGRAM;  Surgeon: Rexene Alberts, MD;  Location: [facility];  Service: Open Heart Surgery;  Laterality: N/A;  . Maze N/A 02/28/2013    Procedure: MAZE;  Surgeon: Rexene Alberts, MD;  Location: [facility];  Service: Open Heart Surgery;  Laterality: N/A;  . Mitral valve repair N/A 02/28/2013    Procedure: MITRAL VALVE REPAIR (MVR);  Surgeon: Rexene Alberts, MD;  Location: [facility];  Service: Open Heart Surgery;  Laterality:  N/A;  . Cardiac catheterization      Family History  Problem Relation Age of Onset  . Diabetes    . Hypertension    . Lung cancer Brother   . Sudden death    . Heart disease    . Stroke    . Heart disease Son   . Stomach cancer Brother     History   Social History  . Marital Status: Married    Spouse Name: N/A    Number of Children: 3  . Years of Education: N/A   Occupational History  . Retired    Social History Main Topics  . Smoking status: Never Smoker   . Smokeless tobacco: Never Used  . Alcohol Use: No  . Drug Use: No  . Sexual Activity: No   Other Topics Concern  . Not on file   Social History Narrative  . No narrative on file    Review of Systems:  All systems reviewed.  They are negative to the above problem except as previously stated.  Vital Signs: There were no vitals taken for this visit.  Physical Exam Patient is in NAD HEENT:  Normocephalic, atraumatic. EOMI, PERRLA.  Neck: JVP is normal.  No bruits.  Lungs: clear to auscultation. No rales no wheezes.  Chest :  INcision is healing well Heart: Regular rate and rhythm. Normal S1, S2. No S3.   No significant murmurs. PMI not displaced.  Abdomen:  Supple, nontender. Normal bowel sounds. No masses. No hepatomegaly.  Extremities:   Good distal pulses throughout. No lower extremity edema.  Musculoskeletal :moving all extremities.  Neuro:   alert and oriented x3.  CN II-XII grossly intact.  EKG  SR 72   Assessment and Plan:  1.  CAD  S/p CABG  Doing well.  Volume status looks good  Will check labs.    2.  Atrial fib.  S/p surgical MAZE Off of amio  Remains in SR  Keep on coumadin for now.  Check labs    3.  MR  Will get echo now that repaired  3.  HL  Keep on statin  Check lipids  Will set to see the patient end of august/setpember

## 2013-06-18 ENCOUNTER — Encounter (HOSPITAL_COMMUNITY): Payer: Medicare HMO

## 2013-06-19 ENCOUNTER — Ambulatory Visit (INDEPENDENT_AMBULATORY_CARE_PROVIDER_SITE_OTHER): Payer: Medicare HMO | Admitting: General Practice

## 2013-06-19 ENCOUNTER — Other Ambulatory Visit: Payer: Self-pay | Admitting: General Practice

## 2013-06-19 DIAGNOSIS — Z5181 Encounter for therapeutic drug level monitoring: Secondary | ICD-10-CM

## 2013-06-19 DIAGNOSIS — I4891 Unspecified atrial fibrillation: Secondary | ICD-10-CM

## 2013-06-19 DIAGNOSIS — Z9889 Other specified postprocedural states: Secondary | ICD-10-CM

## 2013-06-19 LAB — POCT INR: INR: 2.3

## 2013-06-19 NOTE — Progress Notes (Signed)
Pre visit review using our clinic review tool, if applicable. No additional management support is needed unless otherwise documented below in the visit note. 

## 2013-06-20 ENCOUNTER — Encounter (HOSPITAL_COMMUNITY): Payer: Medicare HMO

## 2013-06-22 ENCOUNTER — Encounter (HOSPITAL_COMMUNITY): Payer: Medicare HMO

## 2013-06-25 ENCOUNTER — Encounter (HOSPITAL_COMMUNITY): Payer: Medicare HMO

## 2013-06-27 ENCOUNTER — Other Ambulatory Visit (HOSPITAL_COMMUNITY): Payer: Medicare HMO

## 2013-06-27 ENCOUNTER — Other Ambulatory Visit: Payer: Medicare HMO

## 2013-06-27 ENCOUNTER — Encounter (HOSPITAL_COMMUNITY): Payer: Medicare HMO

## 2013-06-29 ENCOUNTER — Encounter (HOSPITAL_COMMUNITY): Payer: Medicare HMO

## 2013-06-29 ENCOUNTER — Other Ambulatory Visit: Payer: Medicare HMO

## 2013-07-02 ENCOUNTER — Other Ambulatory Visit (INDEPENDENT_AMBULATORY_CARE_PROVIDER_SITE_OTHER): Payer: Medicare HMO

## 2013-07-02 ENCOUNTER — Encounter (HOSPITAL_COMMUNITY): Payer: Medicare HMO

## 2013-07-02 ENCOUNTER — Ambulatory Visit (HOSPITAL_COMMUNITY): Payer: Medicare HMO | Attending: Cardiology | Admitting: Cardiology

## 2013-07-02 DIAGNOSIS — Z5181 Encounter for therapeutic drug level monitoring: Secondary | ICD-10-CM

## 2013-07-02 DIAGNOSIS — I059 Rheumatic mitral valve disease, unspecified: Secondary | ICD-10-CM | POA: Insufficient documentation

## 2013-07-02 DIAGNOSIS — Z951 Presence of aortocoronary bypass graft: Secondary | ICD-10-CM

## 2013-07-02 DIAGNOSIS — Z9889 Other specified postprocedural states: Secondary | ICD-10-CM

## 2013-07-02 DIAGNOSIS — I2510 Atherosclerotic heart disease of native coronary artery without angina pectoris: Secondary | ICD-10-CM

## 2013-07-02 DIAGNOSIS — Z8679 Personal history of other diseases of the circulatory system: Secondary | ICD-10-CM

## 2013-07-02 DIAGNOSIS — Z79899 Other long term (current) drug therapy: Secondary | ICD-10-CM

## 2013-07-02 DIAGNOSIS — I5032 Chronic diastolic (congestive) heart failure: Secondary | ICD-10-CM

## 2013-07-02 DIAGNOSIS — I4891 Unspecified atrial fibrillation: Secondary | ICD-10-CM

## 2013-07-02 LAB — CBC
HCT: 39.1 % (ref 39.0–52.0)
HEMOGLOBIN: 13 g/dL (ref 13.0–17.0)
MCHC: 33.2 g/dL (ref 30.0–36.0)
MCV: 87.4 fl (ref 78.0–100.0)
PLATELETS: 194 10*3/uL (ref 150.0–400.0)
RBC: 4.48 Mil/uL (ref 4.22–5.81)
RDW: 15.3 % (ref 11.5–15.5)
WBC: 5.4 10*3/uL (ref 4.0–10.5)

## 2013-07-02 LAB — BASIC METABOLIC PANEL
BUN: 20 mg/dL (ref 6–23)
CO2: 28 meq/L (ref 19–32)
Calcium: 8.9 mg/dL (ref 8.4–10.5)
Chloride: 105 mEq/L (ref 96–112)
Creatinine, Ser: 1 mg/dL (ref 0.4–1.5)
GFR: 73.99 mL/min (ref >60.00)
Glucose, Bld: 81 mg/dL (ref 70–99)
POTASSIUM: 4.2 meq/L (ref 3.5–5.1)
SODIUM: 140 meq/L (ref 135–145)

## 2013-07-02 LAB — LIPID PANEL
CHOL/HDL RATIO: 3
Cholesterol: 116 mg/dL (ref 0–200)
HDL: 45 mg/dL (ref >39.00)
LDL CALC: 60 mg/dL (ref 0–99)
TRIGLYCERIDES: 55 mg/dL (ref 0.0–149.0)
VLDL: 11 mg/dL (ref 0.0–40.0)

## 2013-07-02 LAB — AST: AST: 24 U/L (ref 0–37)

## 2013-07-02 NOTE — Progress Notes (Signed)
Echo performed. 

## 2013-07-03 ENCOUNTER — Encounter: Payer: Self-pay | Admitting: Internal Medicine

## 2013-07-03 NOTE — Telephone Encounter (Signed)
New message  ° ° °Returning call back to nurse.  °

## 2013-07-03 NOTE — Telephone Encounter (Signed)
Follow up ° ° ° ° °Returning a nurses call °

## 2013-07-04 ENCOUNTER — Encounter (HOSPITAL_COMMUNITY): Payer: Medicare HMO

## 2013-07-04 NOTE — Telephone Encounter (Signed)
This encounter was created in error - please disregard.

## 2013-07-06 ENCOUNTER — Encounter (HOSPITAL_COMMUNITY): Payer: Medicare HMO

## 2013-07-09 ENCOUNTER — Encounter (HOSPITAL_COMMUNITY): Payer: Medicare HMO

## 2013-07-11 ENCOUNTER — Encounter (HOSPITAL_COMMUNITY): Payer: Medicare HMO

## 2013-07-13 ENCOUNTER — Encounter (HOSPITAL_COMMUNITY): Payer: Medicare HMO

## 2013-07-16 ENCOUNTER — Encounter (HOSPITAL_COMMUNITY): Payer: Medicare HMO

## 2013-07-17 ENCOUNTER — Ambulatory Visit (INDEPENDENT_AMBULATORY_CARE_PROVIDER_SITE_OTHER): Payer: Medicare HMO | Admitting: Family Medicine

## 2013-07-17 DIAGNOSIS — Z5181 Encounter for therapeutic drug level monitoring: Secondary | ICD-10-CM

## 2013-07-17 DIAGNOSIS — Z9889 Other specified postprocedural states: Secondary | ICD-10-CM

## 2013-07-17 DIAGNOSIS — I4891 Unspecified atrial fibrillation: Secondary | ICD-10-CM

## 2013-07-17 LAB — POCT INR: INR: 2.1

## 2013-07-18 ENCOUNTER — Encounter (HOSPITAL_COMMUNITY): Payer: Medicare HMO

## 2013-07-20 ENCOUNTER — Encounter (HOSPITAL_COMMUNITY): Payer: Medicare HMO

## 2013-07-23 ENCOUNTER — Encounter (HOSPITAL_COMMUNITY): Payer: Medicare HMO

## 2013-07-25 ENCOUNTER — Encounter (HOSPITAL_COMMUNITY): Payer: Medicare HMO

## 2013-07-27 ENCOUNTER — Encounter (HOSPITAL_COMMUNITY): Payer: Medicare HMO

## 2013-08-14 ENCOUNTER — Ambulatory Visit (INDEPENDENT_AMBULATORY_CARE_PROVIDER_SITE_OTHER): Payer: Medicare HMO | Admitting: Family Medicine

## 2013-08-14 DIAGNOSIS — Z9889 Other specified postprocedural states: Secondary | ICD-10-CM

## 2013-08-14 DIAGNOSIS — Z5181 Encounter for therapeutic drug level monitoring: Secondary | ICD-10-CM

## 2013-08-14 DIAGNOSIS — I4891 Unspecified atrial fibrillation: Secondary | ICD-10-CM

## 2013-08-14 LAB — POCT INR: INR: 2.4

## 2013-09-11 ENCOUNTER — Ambulatory Visit (INDEPENDENT_AMBULATORY_CARE_PROVIDER_SITE_OTHER): Payer: Medicare HMO | Admitting: General Practice

## 2013-09-11 DIAGNOSIS — Z5181 Encounter for therapeutic drug level monitoring: Secondary | ICD-10-CM

## 2013-09-11 DIAGNOSIS — I4891 Unspecified atrial fibrillation: Secondary | ICD-10-CM

## 2013-09-11 DIAGNOSIS — Z9889 Other specified postprocedural states: Secondary | ICD-10-CM

## 2013-09-11 LAB — POCT INR: INR: 3

## 2013-09-11 NOTE — Progress Notes (Signed)
Pre visit review using our clinic review tool, if applicable. No additional management support is needed unless otherwise documented below in the visit note. 

## 2013-09-12 ENCOUNTER — Telehealth: Payer: Self-pay | Admitting: Family Medicine

## 2013-09-12 ENCOUNTER — Other Ambulatory Visit: Payer: Self-pay | Admitting: Family Medicine

## 2013-09-12 NOTE — Telephone Encounter (Signed)
[facility] needs confirmation it is ok to change the company that makes the coum. The previous company no longer makes the coum he is getting. pls call [employer]

## 2013-09-17 ENCOUNTER — Ambulatory Visit (INDEPENDENT_AMBULATORY_CARE_PROVIDER_SITE_OTHER): Payer: Medicare HMO | Admitting: Thoracic Surgery (Cardiothoracic Vascular Surgery)

## 2013-09-17 ENCOUNTER — Encounter: Payer: Self-pay | Admitting: Thoracic Surgery (Cardiothoracic Vascular Surgery)

## 2013-09-17 VITALS — BP 118/67 | HR 67 | Resp 20 | Ht 69.0 in | Wt 188.0 lb

## 2013-09-17 DIAGNOSIS — I480 Paroxysmal atrial fibrillation: Secondary | ICD-10-CM

## 2013-09-17 DIAGNOSIS — I4891 Unspecified atrial fibrillation: Secondary | ICD-10-CM

## 2013-09-17 DIAGNOSIS — Z9889 Other specified postprocedural states: Secondary | ICD-10-CM

## 2013-09-17 DIAGNOSIS — Z8679 Personal history of other diseases of the circulatory system: Secondary | ICD-10-CM

## 2013-09-17 DIAGNOSIS — Z951 Presence of aortocoronary bypass graft: Secondary | ICD-10-CM

## 2013-09-17 NOTE — Progress Notes (Signed)
[address]       [place] [address]             [phone]     CARDIOTHORACIC SURGERY OFFICE NOTE  Referring Provider is Fay Records, MD PCP is [name], MD   HPI:   Patient returns for followup status post mitral valve repair, coronary artery bypass grafting x2, and Maze procedure on 02/28/2013. He was last seen here in the office on 05/14/2013.  Since then he has been seen in followup by Dr. Harrington Challenger, and he underwent a followup echocardiogram 07/02/2013. This demonstrates normal left ventricular size and systolic function was intact mitral valve repair and no significant mitral regurgitation. Mean transvalvular gradient across the mitral valve was estimated 5 mmHg.  He returns to the office for routine followup today now 6 months following his original surgery. He reports that he is doing exceptionally well. He continues to exercise on a regular basis, although he states that recently he has had to cut back on frequency because his wife has been ill. His exercise tolerance is good and he reports no exertional shortness of breath. He has not had any tachypalpitations or dizzy spells. He has not had any further episodes of transient visual disturbances. He has not had any problems related to long-term Coumadin anticoagulation.   Current Outpatient Prescriptions  Medication Sig Dispense Refill  . aspirin 81 MG tablet Take 81 mg by mouth daily.      [name] atorvastatin (LIPITOR) 10 MG tablet Take 1 tablet (10 mg total) by mouth daily. At 6pm  90 tablet  1  . Cholecalciferol (VITAMIN D) 1000 UNITS capsule Take 2,000 Units by mouth every morning.       . fish oil-omega-3 fatty acids 1000 MG capsule Take 1 g by mouth every morning.       . furosemide (LASIX) 40 MG tablet Take 1 tablet (40 mg total) by mouth daily.  90 tablet  1  . levothyroxine (SYNTHROID, LEVOTHROID) 50 MCG tablet Take 1 tablet (50 mcg total) by mouth daily.  90 tablet  3  . metoprolol tartrate  (LOPRESSOR) 25 MG tablet Take 1 tablet (25 mg total) by mouth 2 (two) times daily.  180 tablet  1  . potassium chloride (K-DUR,KLOR-CON) 10 MEQ tablet 1 tab daily      . vitamin C (ASCORBIC ACID) 500 MG tablet Take 500 mg by mouth every morning.       . warfarin (COUMADIN) 5 MG tablet Take as directed by anticoagulation clinic  90 tablet  1   No current facility-administered medications for this visit.      Physical Exam:   BP 118/67  Pulse 67  Resp 20  Ht 5\' 9"  (1.753 m)  Wt 188 lb (85.276 kg)  BMI 27.75 kg/m2  SpO2 98%  General:  Well-appearing  Chest:   Clear to auscultation  CV:   Regular rate and rhythm without murmur  Incisions:  Completely healed  Abdomen:  Soft and nontender  Extremities:  Warm and well-perfused  Diagnostic Tests:  2 channel telemetry rhythm strip demonstrates normal sinus rhythm   Transthoracic Echocardiography  Patient: [name], [place] MR #: [id number] Study Date: 07/02/2013 Gender: M Age: 80 Height: 175.3cm Weight: 83kg BSA: 1.16m^2 Pt. Status: Room:  ATTENDING Otila Back, Nevin Bloodgood [place], Nevin Bloodgood SONOGRAPHER Coralyn Helling PERFORMING [facility], Outpatient cc:  ------------------------------------------------------------ LV EF: 55% - 60%  ------------------------------------------------------------ Indications: 414.01 Coronary atherosclerosis - native artery.  ------------------------------------------------------------  History: PMH: Acquired from the patient and from the patient's chart. Atrial fibrillation. Coronary artery disease. Congestive heart failure. Risk factors: Hypertension.  ------------------------------------------------------------ Study Conclusions  - Left ventricle: The cavity size was normal. Wall thickness was increased in a pattern of mild LVH. There was mild focal basal hypertrophy of the septum. Systolic function was normal. The estimated ejection fraction was in the range of  55% to 60%. Wall motion was normal; there were no regional wall motion abnormalities. Doppler parameters are consistent with high ventricular filling pressure. - Aortic valve: Valve mobility was mildly restricted. Mild regurgitation. - Aortic root: The aortic root was mildly dilated. - Ascending aorta: The ascending aorta was mildly dilated. - Mitral valve: Prior procedures included surgical repair. The findings are consistent with mild stenosis. - Left atrium: The atrium was severely dilated. - Right ventricle: The cavity size was mildly dilated. - Right atrium: The atrium was mildly dilated. - Pulmonary arteries: Systolic pressure was mildly increased. PA peak pressure: 45mm Hg (S). Impressions:  - Compared to 03/13/13, no significant change.  ------------------------------------------------------------ Labs, prior tests, procedures, and surgery: Coronary artery bypass grafting.  Transthoracic echocardiography. M-mode, complete 2D, spectral Doppler, and color Doppler. Height: Height: 175.3cm. Height: 69in. Weight: Weight: 83kg. Weight: 182.6lb. Body mass index: BMI: 27kg/m^2. Body surface area: BSA: 1.50m^2. Blood pressure: 110/65. Patient status: Outpatient. Location: [facility]  ------------------------------------------------------------  ------------------------------------------------------------ Left ventricle: The cavity size was normal. Wall thickness was increased in a pattern of mild LVH. There was mild focal basal hypertrophy of the septum. Systolic function was normal. The estimated ejection fraction was in the range of 55% to 60%. Wall motion was normal; there were no regional wall motion abnormalities. Doppler parameters are consistent with high ventricular filling pressure.  ------------------------------------------------------------ Aortic valve: Trileaflet; mildly calcified leaflets. Valve mobility was mildly restricted. Doppler:  Transvalvular velocity was within the normal range. There was no stenosis. Mild regurgitation.  ------------------------------------------------------------ Aorta: Aortic root: The aortic root was mildly dilated. Ascending aorta: The ascending aorta was mildly dilated.  ------------------------------------------------------------ Mitral valve: Prior procedures included surgical repair. Mobility was not restricted. Doppler: The findings are consistent with mild stenosis. Trivial regurgitation. Valve area by pressure half-time: 1.64cm^2. Indexed valve area by pressure half-time: 0.82cm^2/m^2. Valve area by continuity equation (using LVOT flow): 1.39cm^2. Indexed valve area by continuity equation (using LVOT flow): 0.7cm^2/m^2. Mean gradient: 82mm Hg (D). Peak gradient: 73mm Hg (D).  ------------------------------------------------------------ Left atrium: The atrium was severely dilated.  ------------------------------------------------------------ Right ventricle: The cavity size was mildly dilated. Systolic function was normal.  ------------------------------------------------------------ Pulmonic valve: Doppler: Transvalvular velocity was within the normal range. There was no evidence for stenosis. Trivial regurgitation.  ------------------------------------------------------------ Tricuspid valve: Structurally normal valve. Doppler: Transvalvular velocity was within the normal range. Mild regurgitation.  ------------------------------------------------------------ Pulmonary artery: Systolic pressure was mildly increased.  ------------------------------------------------------------ Right atrium: The atrium was mildly dilated.  ------------------------------------------------------------ Pericardium: There was no pericardial effusion.  ------------------------------------------------------------ Systemic veins: Inferior vena cava: The vessel was normal in  size.  ------------------------------------------------------------  2D measurements Normal Doppler measurements Normal Left ventricle Main pulmonary LVID ED, 45.1 mm 43-52 artery chord, Pressure, 33 mm Hg =30 PLAX S LVID ES, 30.6 mm 23-38 Left ventricle chord, Ea, lat 8.51 cm/s ------ PLAX ann, tiss FS, chord, 32 % >29 DP PLAX E/Ea, lat 19.2 ------ LVPW, ED 11.8 mm ------ ann, tiss 7 IVS/LVPW 1.15 <1.3 DP ratio, ED Ea, med 6.24 cm/s ------ Ventricular septum ann, tiss IVS, ED 13.6 mm ------ DP LVOT E/Ea, med  26.2 ------ Diam, S 24 mm ------ ann, tiss 8 Area 4.52 cm^2 ------ DP Diam 24 mm ------ LVOT Aorta Peak vel, 71 cm/s ------ Root diam, 41 mm ------ S ED VTI, S 19.3 cm ------ AAo AP 38 mm ------ Stroke vol 87.3 ml ------ diam, S Stroke 43.9 ml/m^2 ------ Left atrium index AP dim 42 mm ------ Aortic valve AP dim 2.11 cm/m^2 <2.2 Regurg PHT 503 ms ------ index Mitral valve Peak E vel 164 cm/s ------ Peak A vel 77.3 cm/s ------ Mean vel, 102 cm/s ------ D Decelerati 385 ms 150-23 on time 0 Pressure 134 ms ------ half-time Mean 5 mm Hg ------ gradient, D Peak 12 mm Hg ------ gradient, D Peak E/A 2.1 ------ ratio Area (PHT) 1.64 cm^2 ------ Area index 0.82 cm^2/m ------ (PHT) ^2 Area 1.39 cm^2 ------ (LVOT) continuity Area index 0.7 cm^2/m ------ (LVOT ^2 cont) Annulus 62.6 cm ------ VTI Tricuspid valve Regurg 274 cm/s ------ peak vel Peak RV-RA 30 mm Hg ------ gradient, S Systemic veins Estimated 3 mm Hg ------ CVP Right ventricle Pressure, 33 mm Hg <30 S Sa vel, 10.2 cm/s ------ lat ann, 3 tiss DP  ------------------------------------------------------------ Prepared and Electronically Authenticated by  Kirk Ruths 2015-05-11T08:50:29.500    Impression:  Patient is doing very well 6 months following mitral valve repair, and Maze procedure, and coronary artery bypass grafting. He is maintaining sinus rhythm. His exercise  tolerance is quite good and he denies any symptoms of exertional shortness of breath. Late followup echocardiogram looks good with normal left ventricular function and intact mitral valve repair.   Plan:  The patient will return in 6 months for routine followup and rhythm check. He has been reminded regarding the long-term need for antibiotic prophylaxis for all dental cleaning and related procedures.  I spent in excess of 15 minutes during the conduct of this office consultation and >50% of this time involved direct face-to-face encounter with the patient for counseling and/or coordination of their care.   Valentina Gu. Roxy Manns, MD 09/17/2013 2:03 PM

## 2013-09-17 NOTE — Patient Instructions (Signed)

## 2013-09-19 ENCOUNTER — Telehealth: Payer: Self-pay | Admitting: Internal Medicine

## 2013-09-19 NOTE — Telephone Encounter (Signed)
I spoke with [name] at [facility] she will refax the plan of care attention Dr. Harrington Challenger. Forwarded to her nurse Job Founds RN

## 2013-09-19 NOTE — Telephone Encounter (Signed)
New problem   Joyce case Freight forwarder w/[employer] want to know if we received her fax for care and treatment plan for pt. Please advise

## 2013-10-09 ENCOUNTER — Ambulatory Visit (INDEPENDENT_AMBULATORY_CARE_PROVIDER_SITE_OTHER): Payer: Medicare HMO | Admitting: *Deleted

## 2013-10-09 DIAGNOSIS — Z9889 Other specified postprocedural states: Secondary | ICD-10-CM

## 2013-10-09 DIAGNOSIS — I4891 Unspecified atrial fibrillation: Secondary | ICD-10-CM

## 2013-10-09 LAB — POCT INR: INR: 2.6

## 2013-11-01 ENCOUNTER — Other Ambulatory Visit: Payer: Self-pay | Admitting: Family Medicine

## 2013-11-09 ENCOUNTER — Ambulatory Visit (INDEPENDENT_AMBULATORY_CARE_PROVIDER_SITE_OTHER): Payer: Medicare HMO | Admitting: Family Medicine

## 2013-11-09 DIAGNOSIS — Z23 Encounter for immunization: Secondary | ICD-10-CM

## 2013-11-20 ENCOUNTER — Ambulatory Visit (INDEPENDENT_AMBULATORY_CARE_PROVIDER_SITE_OTHER): Payer: Medicare HMO | Admitting: *Deleted

## 2013-11-20 DIAGNOSIS — Z5181 Encounter for therapeutic drug level monitoring: Secondary | ICD-10-CM

## 2013-11-20 DIAGNOSIS — Z9889 Other specified postprocedural states: Secondary | ICD-10-CM

## 2013-11-20 DIAGNOSIS — I4891 Unspecified atrial fibrillation: Secondary | ICD-10-CM

## 2013-11-20 LAB — POCT INR: INR: 2.6

## 2013-12-04 ENCOUNTER — Telehealth: Payer: Self-pay | Admitting: Family Medicine

## 2013-12-04 ENCOUNTER — Other Ambulatory Visit: Payer: Self-pay | Admitting: Family Medicine

## 2013-12-04 DIAGNOSIS — Z0000 Encounter for general adult medical examination without abnormal findings: Secondary | ICD-10-CM

## 2013-12-04 NOTE — Telephone Encounter (Signed)
Pt is scheduled for a physical on 01/22/14 and would like his labs drawn at the [facility] office. Please put a order in for labs he will go there 01/15/14.  Pt said make sure PSA is included in lab

## 2013-12-04 NOTE — Telephone Encounter (Signed)
Okay per Dr. Sarajane Jews. I put future lab order in computer for [facility] location and spoke with pt.

## 2014-01-02 ENCOUNTER — Ambulatory Visit (INDEPENDENT_AMBULATORY_CARE_PROVIDER_SITE_OTHER): Payer: Medicare HMO

## 2014-01-02 ENCOUNTER — Telehealth: Payer: Self-pay | Admitting: Family

## 2014-01-02 DIAGNOSIS — Z5181 Encounter for therapeutic drug level monitoring: Secondary | ICD-10-CM

## 2014-01-02 LAB — POCT INR: INR: 1.9

## 2014-01-02 NOTE — Telephone Encounter (Signed)
Instructions: Take Coumadin 1 1/2 tablets today and then resume normal dose of 1 tablet everyday except Saturday take 1/2 tablet. Re-check in 4 weeks.    Pt made aware.  No questions or concerns voiced.

## 2014-01-02 NOTE — Telephone Encounter (Signed)
Agree with recommendations.  

## 2014-01-15 ENCOUNTER — Other Ambulatory Visit (INDEPENDENT_AMBULATORY_CARE_PROVIDER_SITE_OTHER): Payer: Medicare HMO

## 2014-01-15 DIAGNOSIS — Z0000 Encounter for general adult medical examination without abnormal findings: Secondary | ICD-10-CM

## 2014-01-15 DIAGNOSIS — E039 Hypothyroidism, unspecified: Secondary | ICD-10-CM

## 2014-01-15 DIAGNOSIS — R972 Elevated prostate specific antigen [PSA]: Secondary | ICD-10-CM

## 2014-01-15 DIAGNOSIS — I10 Essential (primary) hypertension: Secondary | ICD-10-CM

## 2014-01-15 DIAGNOSIS — E785 Hyperlipidemia, unspecified: Secondary | ICD-10-CM

## 2014-01-15 LAB — CBC WITH DIFFERENTIAL/PLATELET
BASOS ABS: 0 10*3/uL (ref 0.0–0.1)
BASOS PCT: 0.5 % (ref 0.0–3.0)
Eosinophils Absolute: 0.2 10*3/uL (ref 0.0–0.7)
Eosinophils Relative: 2.3 % (ref 0.0–5.0)
HCT: 45.7 % (ref 39.0–52.0)
HEMOGLOBIN: 15.1 g/dL (ref 13.0–17.0)
Lymphocytes Relative: 34.7 % (ref 12.0–46.0)
Lymphs Abs: 2.3 10*3/uL (ref 0.7–4.0)
MCHC: 33.1 g/dL (ref 30.0–36.0)
MCV: 91.9 fl (ref 78.0–100.0)
MONOS PCT: 8.1 % (ref 3.0–12.0)
Monocytes Absolute: 0.5 10*3/uL (ref 0.1–1.0)
NEUTROS ABS: 3.6 10*3/uL (ref 1.4–7.7)
Neutrophils Relative %: 54.4 % (ref 43.0–77.0)
Platelets: 214 10*3/uL (ref 150.0–400.0)
RBC: 4.98 Mil/uL (ref 4.22–5.81)
RDW: 13.5 % (ref 11.5–15.5)
WBC: 6.7 10*3/uL (ref 4.0–10.5)

## 2014-01-15 LAB — URINALYSIS
Bilirubin Urine: NEGATIVE
Ketones, ur: NEGATIVE
LEUKOCYTES UA: NEGATIVE
NITRITE: NEGATIVE
Specific Gravity, Urine: 1.015 (ref 1.000–1.030)
Total Protein, Urine: NEGATIVE
UROBILINOGEN UA: 0.2 (ref 0.0–1.0)
Urine Glucose: NEGATIVE
pH: 6 (ref 5.0–8.0)

## 2014-01-15 LAB — LIPID PANEL
CHOL/HDL RATIO: 3
Cholesterol: 121 mg/dL (ref 0–200)
HDL: 38.9 mg/dL — ABNORMAL LOW (ref >39.00)
LDL CALC: 72 mg/dL (ref 0–99)
NonHDL: 82.1
TRIGLYCERIDES: 50 mg/dL (ref 0.0–149.0)
VLDL: 10 mg/dL (ref 0.0–40.0)

## 2014-01-15 LAB — BASIC METABOLIC PANEL
BUN: 17 mg/dL (ref 6–23)
CHLORIDE: 103 meq/L (ref 96–112)
CO2: 27 mEq/L (ref 19–32)
CREATININE: 0.9 mg/dL (ref 0.4–1.5)
Calcium: 8.9 mg/dL (ref 8.4–10.5)
GFR: 91.2 mL/min (ref >60.00)
Glucose, Bld: 77 mg/dL (ref 70–99)
Potassium: 4.2 mEq/L (ref 3.5–5.1)
Sodium: 140 mEq/L (ref 135–145)

## 2014-01-15 LAB — HEPATIC FUNCTION PANEL
ALBUMIN: 4 g/dL (ref 3.5–5.2)
ALT: 21 U/L (ref 0–53)
AST: 27 U/L (ref 0–37)
Alkaline Phosphatase: 72 U/L (ref 39–117)
BILIRUBIN DIRECT: 0.2 mg/dL (ref 0.0–0.3)
TOTAL PROTEIN: 6.8 g/dL (ref 6.0–8.3)
Total Bilirubin: 0.7 mg/dL (ref 0.2–1.2)

## 2014-01-15 LAB — PSA: PSA: 16.73 ng/mL — AB (ref 0.10–4.00)

## 2014-01-15 LAB — TSH: TSH: 3.93 u[IU]/mL (ref 0.35–4.50)

## 2014-01-22 ENCOUNTER — Encounter: Payer: Self-pay | Admitting: Family Medicine

## 2014-01-22 ENCOUNTER — Ambulatory Visit (INDEPENDENT_AMBULATORY_CARE_PROVIDER_SITE_OTHER): Payer: Medicare HMO | Admitting: Family Medicine

## 2014-01-22 VITALS — BP 115/66 | HR 68 | Temp 98.1°F | Ht 69.0 in | Wt 186.0 lb

## 2014-01-22 DIAGNOSIS — Z0000 Encounter for general adult medical examination without abnormal findings: Secondary | ICD-10-CM

## 2014-01-22 DIAGNOSIS — Z23 Encounter for immunization: Secondary | ICD-10-CM

## 2014-01-22 MED ORDER — METOPROLOL TARTRATE 25 MG PO TABS: 25.0000 mg | ORAL_TABLET | Freq: Two times a day (BID) | ORAL | Status: DC

## 2014-01-22 MED ORDER — ATORVASTATIN CALCIUM 10 MG PO TABS: ORAL_TABLET | ORAL | Status: DC

## 2014-01-22 MED ORDER — DICYCLOMINE HCL 20 MG PO TABS: 20.0000 mg | ORAL_TABLET | Freq: Three times a day (TID) | ORAL | Status: DC

## 2014-01-22 MED ORDER — FUROSEMIDE 20 MG PO TABS: 20.0000 mg | ORAL_TABLET | Freq: Every day | ORAL | Status: DC

## 2014-01-22 NOTE — Progress Notes (Signed)
   Subjective:    Patient ID: Luis Buba., male    DOB: 22-Apr-1927, 80 y.o.   MRN: [id number]  HPI 80 yr old male for a cpx. He feels well in general. His cardiac status is stable since he had the ablation procedure with no signs of atrial fibrillation. He has a long hx of IBS with frequent loose stools, and he wants to try something again for this.    Review of Systems  Constitutional: Negative.   HENT: Negative.   Eyes: Negative.   Respiratory: Negative.   Cardiovascular: Negative.   Gastrointestinal: Negative.   Genitourinary: Negative.   Musculoskeletal: Negative.   Skin: Negative.   Neurological: Negative.   Psychiatric/Behavioral: Negative.        Objective:   Physical Exam  Constitutional: He is oriented to person, place, and time. He appears well-developed and well-nourished. No distress.  HENT:  Head: Normocephalic and atraumatic.  Right Ear: External ear normal.  Left Ear: External ear normal.  Nose: Nose normal.  Mouth/Throat: Oropharynx is clear and moist. No oropharyngeal exudate.  Eyes: Conjunctivae and EOM are normal. Pupils are equal, round, and reactive to light. Right eye exhibits no discharge. Left eye exhibits no discharge. No scleral icterus.  Neck: Neck supple. No JVD present. No tracheal deviation present. No thyromegaly present.  Cardiovascular: Normal rate, regular rhythm, normal heart sounds and intact distal pulses.  Exam reveals no gallop and no friction rub.   No murmur heard. Pulmonary/Chest: Effort normal and breath sounds normal. No respiratory distress. He has no wheezes. He has no rales. He exhibits no tenderness.  Abdominal: Soft. Bowel sounds are normal. He exhibits no distension and no mass. There is no tenderness. There is no rebound and no guarding.  Genitourinary: Rectum normal, prostate normal and penis normal. Guaiac negative stool. No penile tenderness.  Musculoskeletal: Normal range of motion. He exhibits no edema or tenderness.   Lymphadenopathy:    He has no cervical adenopathy.  Neurological: He is alert and oriented to person, place, and time. He has normal reflexes. No cranial nerve deficit. He exhibits normal muscle tone. Coordination normal.  Skin: Skin is warm and dry. No rash noted. He is not diaphoretic. No erythema. No pallor.  Psychiatric: He has a normal mood and affect. His behavior is normal. Judgment and thought content normal.          Assessment & Plan:  Well exam. Try Dicyclomine for the IBS.

## 2014-01-22 NOTE — Progress Notes (Signed)
Pre visit review using our clinic review tool, if applicable. No additional management support is needed unless otherwise documented below in the visit note. 

## 2014-01-22 NOTE — Addendum Note (Signed)
Addended by: Aggie Hacker A on: 01/22/2014 11:14 AM   Modules accepted: Orders

## 2014-01-30 ENCOUNTER — Ambulatory Visit (INDEPENDENT_AMBULATORY_CARE_PROVIDER_SITE_OTHER): Payer: Medicare HMO

## 2014-01-30 ENCOUNTER — Encounter: Payer: Self-pay | Admitting: Family Medicine

## 2014-01-30 ENCOUNTER — Telehealth: Payer: Self-pay | Admitting: Family

## 2014-01-30 ENCOUNTER — Telehealth: Payer: Self-pay | Admitting: Family Medicine

## 2014-01-30 DIAGNOSIS — Z5181 Encounter for therapeutic drug level monitoring: Secondary | ICD-10-CM

## 2014-01-30 LAB — POCT INR: INR: 1.5

## 2014-01-30 NOTE — Telephone Encounter (Signed)
Agree with plan 

## 2014-01-30 NOTE — Telephone Encounter (Signed)
I updated pt chart with Aspirin 81 mg.

## 2014-01-31 ENCOUNTER — Encounter (HOSPITAL_COMMUNITY): Payer: Self-pay | Admitting: Cardiology

## 2014-02-04 ENCOUNTER — Other Ambulatory Visit: Payer: Self-pay | Admitting: Family Medicine

## 2014-02-05 ENCOUNTER — Telehealth: Payer: Self-pay | Admitting: Family Medicine

## 2014-02-05 ENCOUNTER — Telehealth: Payer: Self-pay | Admitting: Family

## 2014-02-05 ENCOUNTER — Ambulatory Visit (INDEPENDENT_AMBULATORY_CARE_PROVIDER_SITE_OTHER): Payer: Medicare HMO

## 2014-02-05 ENCOUNTER — Other Ambulatory Visit: Payer: Self-pay | Admitting: Family Medicine

## 2014-02-05 DIAGNOSIS — Z5181 Encounter for therapeutic drug level monitoring: Secondary | ICD-10-CM

## 2014-02-05 DIAGNOSIS — I4891 Unspecified atrial fibrillation: Secondary | ICD-10-CM

## 2014-02-05 LAB — POCT INR: INR: 1.9

## 2014-02-05 MED ORDER — WARFARIN SODIUM 5 MG PO TABS: ORAL_TABLET | ORAL | Status: DC

## 2014-02-05 NOTE — Telephone Encounter (Signed)
Agree with plan 

## 2014-02-05 NOTE — Telephone Encounter (Signed)
Pt request refill of the following: warfarin (COUMADIN) 5 MG tablet     pharmacy: [facility]

## 2014-02-12 ENCOUNTER — Ambulatory Visit (INDEPENDENT_AMBULATORY_CARE_PROVIDER_SITE_OTHER): Payer: Medicare HMO

## 2014-02-12 DIAGNOSIS — Z5181 Encounter for therapeutic drug level monitoring: Secondary | ICD-10-CM

## 2014-02-12 LAB — POCT INR: INR: 1.7

## 2014-02-19 ENCOUNTER — Ambulatory Visit (INDEPENDENT_AMBULATORY_CARE_PROVIDER_SITE_OTHER): Payer: Medicare HMO | Admitting: Family Medicine

## 2014-02-19 DIAGNOSIS — Z5181 Encounter for therapeutic drug level monitoring: Secondary | ICD-10-CM

## 2014-02-19 LAB — POCT INR: INR: 2.2

## 2014-02-21 ENCOUNTER — Telehealth: Payer: Self-pay | Admitting: Family Medicine

## 2014-02-21 NOTE — Telephone Encounter (Signed)
I spoke with pt and he will check with pharmacy to see if script is ready.

## 2014-02-21 NOTE — Telephone Encounter (Signed)
Pt left a voice message stating that the [employer] needs a prior authorization, have you received the form yet for this?

## 2014-02-21 NOTE — Telephone Encounter (Signed)
Yes. The approval was faxed to the pharmacy.

## 2014-03-19 ENCOUNTER — Ambulatory Visit: Payer: Medicare HMO

## 2014-03-25 ENCOUNTER — Ambulatory Visit (INDEPENDENT_AMBULATORY_CARE_PROVIDER_SITE_OTHER): Payer: Medicare HMO | Admitting: Thoracic Surgery (Cardiothoracic Vascular Surgery)

## 2014-03-25 ENCOUNTER — Encounter: Payer: Self-pay | Admitting: Thoracic Surgery (Cardiothoracic Vascular Surgery)

## 2014-03-25 VITALS — BP 105/67 | HR 67 | Resp 16 | Ht 69.0 in | Wt 186.0 lb

## 2014-03-25 DIAGNOSIS — Z9889 Other specified postprocedural states: Secondary | ICD-10-CM

## 2014-03-25 NOTE — Progress Notes (Signed)
[address]       [place] [address]             [phone]     CARDIOTHORACIC SURGERY OFFICE NOTE  Referring Provider is Fay Records, MD PCP is [name], MD   HPI:  Patient returns for followup approximately 1 year status post mitral valve repair, coronary artery bypass grafting x2, and Maze procedure on 02/28/2013. He was last seen here in the office on 09/17/2013.  He is followed by Dr. Harrington Challenger, although he has not seen her since 06/15/13.  Today [name] reports that he has been feeling very well without any complaints or concerns.  Since his last visit he has been walking more on his own.  He completed 5 weeks of cardiac rehab and switched to the silver sneakers program.  He has been compliant with all prescribed medications and notes a change in his Lasix dose by his PCP from 40 daily to 20 daily because frequent urination was impacting his ADLs.  This change has worked for him.  He denies any extremity swelling or dyspnea.  He denies CP and palpitations.     Current Outpatient Prescriptions  Medication Sig Dispense Refill  . aspirin 81 MG tablet Take 81 mg by mouth daily.    [name] atorvastatin (LIPITOR) 10 MG tablet TAKE ONE TABLET BY MOUTH DAILY AT 6 PM. 90 tablet 3  . Cholecalciferol (VITAMIN D) 1000 UNITS capsule Take 2,000 Units by mouth every morning.     . dicyclomine (BENTYL) 20 MG tablet Take 1 tablet (20 mg total) by mouth 3 (three) times daily before meals. 270 tablet 3  . fish oil-omega-3 fatty acids 1000 MG capsule Take 1 g by mouth every morning.     . furosemide (LASIX) 20 MG tablet Take 1 tablet (20 mg total) by mouth daily. 90 tablet 3  . levothyroxine (SYNTHROID, LEVOTHROID) 50 MCG tablet     . metoprolol tartrate (LOPRESSOR) 25 MG tablet Take 1 tablet (25 mg total) by mouth 2 (two) times daily. 180 tablet 3  . vitamin C (ASCORBIC ACID) 500 MG tablet Take 500 mg by mouth every morning.     . warfarin (COUMADIN) 5 MG tablet Take as  directed by anticoagulation clinic 90 tablet 0   No current facility-administered medications for this visit.      Physical Exam:   BP 105/67 mmHg  Pulse 67  Resp 16  Ht 5\' 9"  (1.753 m)  Wt 186 lb (84.369 kg)  BMI 27.45 kg/m2  SpO2 98%  General:  Well appearing in NAD  Chest:   CTA b/l, a/p, no wheezes or rhonchi  CV:   RRR, no murmur appreciated  Incisions:  Well healed  Abdomen:  NT, ND, +BS  Extremities:  Warm and well perfused.  No edema  Diagnostic Tests:  03/25/14  2 Channel Telemetry Rhythm Strip:  NSR @67bpm    Impression:  Patient is doing very well approximately 1 year following mitral valve repair, Maze procedure, and coronary artery bypass grafting.  Normal Sinus Rhythm today.  Plan:  The patient will return for routine follow-up and rhythm check in one year. He has been reminded regarding the long-term need for antibiotic prophylaxis for all dental cleaning and related procedures. He has been instructed to follow-up with Dr. Harrington Challenger He is amenable to follow-up via [facility] on a bi-annual basis.  Luis Hockey, PA-S   I have seen and examined the patient and  agree with the assessment and plan as outlined.  I spent in excess of 15 minutes during the conduct of this office consultation and >50% of this time involved direct face-to-face encounter with the patient for counseling and/or coordination of their care.  Valentina Gu. Roxy Manns, MD 03/25/2014 12:09 PM

## 2014-03-25 NOTE — Patient Instructions (Addendum)
Schedule appointment in [facility] in 6 months  Endocarditis is a potentially serious infection of heart valves or inside lining of the heart.  It occurs more commonly in patients with diseased heart valves (such as patient's with aortic or mitral valve disease) and in patients who have undergone heart valve repair or replacement.  Certain surgical and dental procedures may put you at risk, such as dental cleaning, other dental procedures, or any surgery involving the respiratory, urinary, gastrointestinal tract, gallbladder or prostate gland.   To minimize your chances for develooping endocarditis, maintain good oral health and seek prompt medical attention for any infections involving the mouth, teeth, gums, skin or urinary tract.  Always notify your doctor or dentist about your underlying heart valve condition before having any invasive procedures. You will need to take antibiotics before certain procedures.

## 2014-03-26 ENCOUNTER — Ambulatory Visit (INDEPENDENT_AMBULATORY_CARE_PROVIDER_SITE_OTHER): Payer: Medicare HMO | Admitting: General Practice

## 2014-03-26 ENCOUNTER — Telehealth: Payer: Self-pay | Admitting: Family

## 2014-03-26 DIAGNOSIS — Z5181 Encounter for therapeutic drug level monitoring: Secondary | ICD-10-CM

## 2014-03-26 LAB — POCT INR: INR: 2.3

## 2014-03-26 NOTE — Progress Notes (Signed)
Pre visit review using our clinic review tool, if applicable. No additional management support is needed unless otherwise documented below in the visit note. 

## 2014-03-26 NOTE — Telephone Encounter (Signed)
Agree with plan 

## 2014-04-23 ENCOUNTER — Ambulatory Visit (INDEPENDENT_AMBULATORY_CARE_PROVIDER_SITE_OTHER): Payer: Medicare HMO | Admitting: General Practice

## 2014-04-23 DIAGNOSIS — Z5181 Encounter for therapeutic drug level monitoring: Secondary | ICD-10-CM

## 2014-04-23 LAB — POCT INR: INR: 2.4

## 2014-04-23 NOTE — Progress Notes (Signed)
Pre visit review using our clinic review tool, if applicable. No additional management support is needed unless otherwise documented below in the visit note. 

## 2014-04-23 NOTE — Progress Notes (Signed)
Agree with plan 

## 2014-05-03 ENCOUNTER — Emergency Department (HOSPITAL_COMMUNITY): Payer: Medicare HMO

## 2014-05-03 ENCOUNTER — Emergency Department (HOSPITAL_COMMUNITY)
Admission: EM | Admit: 2014-05-03 | Discharge: 2014-05-03 | Disposition: A | Discharge status: Home / Self Care | Payer: Medicare HMO | Attending: Emergency Medicine | Admitting: Emergency Medicine

## 2014-05-03 ENCOUNTER — Encounter (HOSPITAL_COMMUNITY): Payer: Self-pay | Admitting: Emergency Medicine

## 2014-05-03 DIAGNOSIS — Z79899 Other long term (current) drug therapy: Secondary | ICD-10-CM | POA: Insufficient documentation

## 2014-05-03 DIAGNOSIS — Z7901 Long term (current) use of anticoagulants: Secondary | ICD-10-CM

## 2014-05-03 DIAGNOSIS — K589 Irritable bowel syndrome without diarrhea: Secondary | ICD-10-CM | POA: Diagnosis not present

## 2014-05-03 DIAGNOSIS — I2510 Atherosclerotic heart disease of native coronary artery without angina pectoris: Secondary | ICD-10-CM | POA: Insufficient documentation

## 2014-05-03 DIAGNOSIS — Z8679 Personal history of other diseases of the circulatory system: Secondary | ICD-10-CM

## 2014-05-03 DIAGNOSIS — R42 Dizziness and giddiness: Secondary | ICD-10-CM | POA: Diagnosis not present

## 2014-05-03 DIAGNOSIS — Z8709 Personal history of other diseases of the respiratory system: Secondary | ICD-10-CM | POA: Insufficient documentation

## 2014-05-03 DIAGNOSIS — Z86010 Personal history of colonic polyps: Secondary | ICD-10-CM | POA: Insufficient documentation

## 2014-05-03 DIAGNOSIS — I10 Essential (primary) hypertension: Secondary | ICD-10-CM | POA: Insufficient documentation

## 2014-05-03 DIAGNOSIS — Z8546 Personal history of malignant neoplasm of prostate: Secondary | ICD-10-CM | POA: Diagnosis not present

## 2014-05-03 DIAGNOSIS — Z7982 Long term (current) use of aspirin: Secondary | ICD-10-CM | POA: Insufficient documentation

## 2014-05-03 DIAGNOSIS — R001 Bradycardia, unspecified: Secondary | ICD-10-CM | POA: Insufficient documentation

## 2014-05-03 DIAGNOSIS — Z951 Presence of aortocoronary bypass graft: Secondary | ICD-10-CM

## 2014-05-03 DIAGNOSIS — Z9889 Other specified postprocedural states: Secondary | ICD-10-CM | POA: Insufficient documentation

## 2014-05-03 DIAGNOSIS — I480 Paroxysmal atrial fibrillation: Secondary | ICD-10-CM | POA: Diagnosis present

## 2014-05-03 HISTORY — DX: Paroxysmal atrial fibrillation: I48.0

## 2014-05-03 LAB — BASIC METABOLIC PANEL
Anion gap: 9 (ref 5–15)
BUN: 15 mg/dL (ref 6–23)
CO2: 26 mmol/L (ref 19–32)
CREATININE: 0.91 mg/dL (ref 0.50–1.35)
Calcium: 8.7 mg/dL (ref 8.4–10.5)
Chloride: 105 mmol/L (ref 96–112)
GFR calc Af Amer: 88 mL/min — ABNORMAL LOW (ref >90)
GFR, EST NON AFRICAN AMERICAN: 76 mL/min — AB (ref >90)
GLUCOSE: 100 mg/dL — AB (ref 70–99)
Potassium: 3.9 mmol/L (ref 3.5–5.1)
SODIUM: 140 mmol/L (ref 135–145)

## 2014-05-03 LAB — URINALYSIS, ROUTINE W REFLEX MICROSCOPIC
Bilirubin Urine: NEGATIVE
Glucose, UA: NEGATIVE mg/dL
Hgb urine dipstick: NEGATIVE
Ketones, ur: NEGATIVE mg/dL
LEUKOCYTES UA: NEGATIVE
Nitrite: NEGATIVE
PROTEIN: NEGATIVE mg/dL
SPECIFIC GRAVITY, URINE: 1.024 (ref 1.005–1.030)
UROBILINOGEN UA: 0.2 mg/dL (ref 0.0–1.0)
pH: 6 (ref 5.0–8.0)

## 2014-05-03 LAB — I-STAT TROPONIN, ED: Troponin i, poc: 0 ng/mL (ref 0.00–0.08)

## 2014-05-03 LAB — CBC
HCT: 40.7 % (ref 39.0–52.0)
Hemoglobin: 14 g/dL (ref 13.0–17.0)
MCH: 30.4 pg (ref 26.0–34.0)
MCHC: 34.4 g/dL (ref 30.0–36.0)
MCV: 88.5 fL (ref 78.0–100.0)
Platelets: 135 10*3/uL — ABNORMAL LOW (ref 150–400)
RBC: 4.6 MIL/uL (ref 4.22–5.81)
RDW: 13.2 % (ref 11.5–15.5)
WBC: 4.9 10*3/uL (ref 4.0–10.5)

## 2014-05-03 LAB — CBG MONITORING, ED: Glucose-Capillary: 80 mg/dL (ref 70–99)

## 2014-05-03 LAB — PROTIME-INR
INR: 1.87 — AB (ref 0.00–1.49)
PROTHROMBIN TIME: 21.7 s — AB (ref 11.6–15.2)

## 2014-05-03 MED ORDER — MECLIZINE HCL 25 MG PO TABS
25.0000 mg | ORAL_TABLET | Freq: Once | ORAL | Status: AC
  Administered 2014-05-03: 25 mg via ORAL
  Filled 2014-05-03: qty 1

## 2014-05-03 MED ORDER — MECLIZINE HCL 25 MG PO TABS: 25.0000 mg | ORAL_TABLET | Freq: Three times a day (TID) | ORAL | Status: DC | PRN

## 2014-05-03 NOTE — ED Notes (Signed)
Attempted Orthostatics and pt was too dizzy to attempt to sit on the side of the bed. MD to be made aware.

## 2014-05-03 NOTE — ED Notes (Signed)
Cardiology at the bedside.

## 2014-05-03 NOTE — ED Provider Notes (Signed)
CSN: [id number]     Arrival date & time 05/03/14  0840 History   First MD Initiated Contact with Patient 05/03/14 [phone]     Chief Complaint  Patient presents with  . Dizziness     (Consider location/radiation/quality/duration/timing/severity/associated sxs/prior Treatment) HPI Comments: Pt states that when he woke up this morning he was very dizzy. He had to use the restroom but when he went to sit on the side of the bead he felt dizzy. He states that he head felt dizzy and then the room was spinning. He was never able to stand unasisted  Patient is a 81 y.o. male presenting with dizziness. The history is provided by the patient. No language interpreter was used.  Dizziness Quality:  Room spinning Severity:  Moderate Onset quality:  Sudden Timing:  Constant Progression:  Unchanged Chronicity:  New Context comment:  Change of position Relieved by:  Being still Worsened by:  Movement Ineffective treatments:  None tried Associated symptoms: no chest pain, no nausea, no palpitations, no shortness of breath, no vision changes and no vomiting     Past Medical History  Diagnosis Date  . Personal history of colonic polyps     tubular adenoma  . Hypertension   . Irritable bowel syndrome   . Prostate cancer 1991    sees Dr. Carlota Raspberry at [place], observation only   . Atrial fibrillation   . S/P mitral valve repair, maze procedure, and CABG x2 02/28/2013    Complex valvuloplasty including triangular resection of posterior leaflet, 30 mm Sorin Memo 3D ring annuloplasty  . S/P CABG x 2 02/28/2013    LIMA to LAD, SVG to OM1, EVH via right thigh  . S/P Maze operation for atrial fibrillation 02/28/2013    Complete bilateral atrial lesion set using bipolar radiofrequency and cryothermy ablation with clipping of LA appendage  . Pleural effusion, bilateral 03/12/2013    Small to moderate, L>R  . Complication of anesthesia     "serious cognisance problems since my OR in January" (03/13/2013  . Coronary  artery disease    Past Surgical History  Procedure Laterality Date  . Hemorrhoid surgery    . Prostate biopsy    . Colonoscopy  2011    per Dr. Sharlett Iles, benign polyps, no repeats needed   . Cataract extraction, bilateral Bilateral 2014    per Dr. Gershon Crane   . Coronary artery bypass graft N/A 02/28/2013    Procedure: CORONARY ARTERY BYPASS GRAFTING (CABG);  Surgeon: Rexene Alberts, MD;  Location: [facility];  Service: Open Heart Surgery;  Laterality: N/A;  CABG x 2,  using left internal mammary artery and right leg saphenous vein harvested endoscopically  . Intraoperative transesophageal echocardiogram N/A 02/28/2013    Procedure: INTRAOPERATIVE TRANSESOPHAGEAL ECHOCARDIOGRAM;  Surgeon: Rexene Alberts, MD;  Location: [facility];  Service: Open Heart Surgery;  Laterality: N/A;  . Maze N/A 02/28/2013    Procedure: MAZE;  Surgeon: Rexene Alberts, MD;  Location: [facility];  Service: Open Heart Surgery;  Laterality: N/A;  . Mitral valve repair N/A 02/28/2013    Procedure: MITRAL VALVE REPAIR (MVR);  Surgeon: Rexene Alberts, MD;  Location: [place];  Service: Open Heart Surgery;  Laterality: N/A;  . Cardiac catheterization    . Left heart catheterization with coronary angiogram N/A 02/26/2013    Procedure: LEFT HEART CATHETERIZATION WITH CORONARY ANGIOGRAM;  Surgeon: Leonie Man, MD;  Location: [facility] CATH LAB;  Service: Cardiovascular;  Laterality: N/A;   Family History  Problem  Relation Age of Onset  . Diabetes    . Hypertension    . Lung cancer Brother   . Sudden death    . Heart disease    . Stroke    . Heart disease Son   . Stomach cancer Brother    History  Substance Use Topics  . Smoking status: Never Smoker   . Smokeless tobacco: Never Used  . Alcohol Use: No    Review of Systems  Respiratory: Negative for shortness of breath.   Cardiovascular: Negative for chest pain and palpitations.  Gastrointestinal: Negative for nausea and vomiting.  Neurological: Positive for dizziness.  All other  systems reviewed and are negative.     Allergies  Review of patient's allergies indicates no known allergies.  Home Medications   Prior to Admission medications   Medication Sig Start Date End Date Taking? Authorizing Provider  aspirin 81 MG tablet Take 81 mg by mouth daily.   Yes Historical Provider, MD  atorvastatin (LIPITOR) 10 MG tablet TAKE ONE TABLET BY MOUTH DAILY AT 6 PM. 01/22/14  Yes [name], MD  Cholecalciferol (VITAMIN D) 1000 UNITS capsule Take 2,000 Units by mouth every morning.    Yes Historical Provider, MD  dicyclomine (BENTYL) 20 MG tablet Take 1 tablet (20 mg total) by mouth 3 (three) times daily before meals. 01/22/14  Yes [name], MD  fish oil-omega-3 fatty acids 1000 MG capsule Take 1 g by mouth every morning.    Yes Historical Provider, MD  furosemide (LASIX) 20 MG tablet Take 1 tablet (20 mg total) by mouth daily. 01/22/14  Yes [name], MD  levothyroxine (SYNTHROID, LEVOTHROID) 50 MCG tablet 50 mcg every morning.  11/10/13  Yes Historical Provider, MD  metoprolol tartrate (LOPRESSOR) 25 MG tablet Take 1 tablet (25 mg total) by mouth 2 (two) times daily. 01/22/14  Yes [name], MD  vitamin C (ASCORBIC ACID) 500 MG tablet Take 500 mg by mouth every morning.    Yes Historical Provider, MD  warfarin (COUMADIN) 5 MG tablet Take as directed by anticoagulation clinic Patient taking differently: Take 5-7.5 mg by mouth See admin instructions. Take 5 mg mon,tues,thurs,sat and Sunday and 7.5 mg on wed and Friday 02/05/14  Yes Timoteo Gaul, FNP   BP 142/80 mmHg  Pulse 44  Temp(Src) 98.1 F (36.7 C) (Oral)  Resp 12  Ht 5\' 9"  (1.753 m)  Wt 184 lb (83.462 kg)  BMI 27.16 kg/m2  SpO2 99% Physical Exam  Constitutional: He is oriented to person, place, and time. He appears well-developed and well-nourished.  HENT:  Head: Normocephalic and atraumatic.  Cardiovascular: Normal rate and regular rhythm.   Pulmonary/Chest: Effort normal and breath sounds  normal.  Abdominal: Soft. Bowel sounds are normal.  Neurological: He is alert and oriented to person, place, and time.  Negative finger to nose: no pronator drift:some nystagmus noted with sitting up  Skin: Skin is warm and dry.  Psychiatric: He has a normal mood and affect.  Nursing note and vitals reviewed.   ED Course  Procedures (including critical care time) Labs Review Labs Reviewed  CBC - Abnormal; Notable for the following:    Platelets 135 (*)    All other components within normal limits  BASIC METABOLIC PANEL - Abnormal; Notable for the following:    Glucose, Bld 100 (*)    GFR calc non Af Amer 76 (*)    GFR calc Af Amer 88 (*)    All  other components within normal limits  PROTIME-INR - Abnormal; Notable for the following:    Prothrombin Time 21.7 (*)    INR 1.87 (*)    All other components within normal limits  URINALYSIS, ROUTINE W REFLEX MICROSCOPIC  CBG MONITORING, ED  Randolm Idol, ED    Imaging Review Dg Chest 2 View  05/03/2014   CLINICAL DATA:  Dizziness and hypertension  EXAM: CHEST  2 VIEW  COMPARISON:  03/26/2013  FINDINGS: Cardiac shadow is stable. Postoperative changes are again seen. A large hiatal hernia is again noted. The lungs are well aerated bilaterally. No focal infiltrate is noted. Degenerative change of thoracic spine is seen.  IMPRESSION: Large hiatal hernia stable.  No acute abnormality noted.   Electronically Signed   By: Inez Catalina M.D.   On: 05/03/2014 10:18   Ct Head Wo Contrast  05/03/2014   CLINICAL DATA:  Dizziness  EXAM: CT HEAD WITHOUT CONTRAST  TECHNIQUE: Contiguous axial images were obtained from the base of the skull through the vertex without intravenous contrast.  COMPARISON:  CT 03/12/2013  FINDINGS: Generalized atrophy. Chronic microvascular ischemic changes in the white matter.  Negative for acute infarct.  Negative for hemorrhage or mass.  Extensive atherosclerotic calcification.  Calvarium intact.  IMPRESSION: Atrophy and  chronic microvascular ischemia.  No acute abnormality.   Electronically Signed   By: Franchot Gallo M.D.   On: 05/03/2014 10:32     EKG Interpretation   Date/Time:  Friday May 03 2014 08:48:30 EST Ventricular Rate:  69 PR Interval:    QRS Duration: 104 QT Interval:  468 QTC Calculation: 501 R Axis:   17 Text Interpretation:  Atrial fibrillation Ventricular premature complex  Anteroseptal infarct, age indeterminate Prolonged QT interval which is new  Baseline wander in lead(s) II aVR aVF V1 Confirmed by DOCHERTY  MD, MEGAN  ([id number]) on 05/03/2014 9:23:09 AM      MDM   Final diagnoses:  Dizziness  Bradycardia  Vertigo    9:42 AM Pts heart rate is in the 40's 12:46 PM Pt hear rate in the 70's at this time:pt is ambulatory with some mild dizziness still. Consulted cardiology as the bradycardia and dizziness coincided  2:52 PM Pt seen by cardiology and cleared to go home and given follow up. Pt continues to be able to ambulate and feel better.will send home with meclizine   Glendell Docker, NP 05/03/14 1455  Ernestina Patches, MD 05/04/14 [phone]

## 2014-05-03 NOTE — ED Notes (Signed)
Per EMS: woke uip this am felt dizzy with sitting up, called EMS. Dizziness is intermittent but occurs with movement, hx of afib and open heart surgery.  Did have orthostatic changes, went from 267 systolic to 124 systolic with laying down to a sitting position.  His CBG 98, not had breakfast nor daily medications

## 2014-05-03 NOTE — ED Notes (Signed)
Patient transported to CT 

## 2014-05-03 NOTE — ED Notes (Signed)
Pt feeling a little better. States he got up and walked with some other people earlier and wasn't 100 % but was better than when he came in.

## 2014-05-03 NOTE — Consult Note (Signed)
Reason for Consult: dizziness, concern for atrial fib   Referring Physician: ER MD   PCP:  [name], MD  Primary Cardiologist:Dr. Keenan Bachelor. is an 81 y.o. male.    Chief Complaint: dizziness    HPI: 81 year old male with a Hx of afib on coumadin CHA2DS2VASc score of 4 ( HTN, age, vascular, and age)  Echo done. Normal LV function Mild MR Holter with atrial fibrillation9/2014. No CP Occasional shoulder pain  Underwent L heart cath. 2014 Ths showed. Calcified L main; LAD with 80 to 90% mid LAD; D1 70 to 80%; D2 99%; ; LCx 95 to 99% prox AV groove artery gives collaterals to R. RCA100% R to R collaterals The patient then underwent CABG (LIMA to LAD, SVG to OM1) with MVRepair and Maze procedure.  Echo 07/03/14 mild LVH, EF 55-60%, aortic root mildly dilated.  Mild MS PA pk pressure 33.  He has been in [place] on EKGs since 02/2013. No longer on amiodarone.   Today he presents due to dizziness.  He was in usual state of health until he sat up upon wakening this am.  He had room spinning dizziness.  No nausea, no chest pain, no diaphoresis.  He has had no awareness of palpitations.   No slurred speech, no weakness in limbs.   He called EMS due to the severity.    When sitting up.  + orthostatic changes 045 systolic to 997 systolic, from lying to sitting.   He does admit to tree allergies and recent cough and sneezing.   Glucose 98.  Troponin is 0.00, INR low at 1.87. CT head without acute changes.    EKG is read as a fib but is SR with PAC and no acute changes. Pt has walked here and did well.   Past Medical History  Diagnosis Date  . Personal history of colonic polyps     tubular adenoma  . Hypertension   . Irritable bowel syndrome   . Prostate cancer 1991    sees Dr. Carlota Raspberry at [place], observation only   . S/P mitral valve repair, maze procedure, and CABG x2 02/28/2013    Complex valvuloplasty including triangular resection of posterior leaflet, 30  mm Sorin Memo 3D ring annuloplasty  . S/P CABG x 2 02/28/2013    LIMA to LAD, SVG to OM1, EVH via right thigh  . S/P Maze operation for atrial fibrillation 02/28/2013    Complete bilateral atrial lesion set using bipolar radiofrequency and cryothermy ablation with clipping of LA appendage  . Pleural effusion, bilateral 03/12/2013    Small to moderate, L>R  . Complication of anesthesia     "serious cognisance problems since my OR in January" (03/13/2013  . Coronary artery disease   . PAF (paroxysmal atrial fibrillation) 05/03/2014    Past Surgical History  Procedure Laterality Date  . Hemorrhoid surgery    . Prostate biopsy    . Colonoscopy  2011    per Dr. Sharlett Iles, benign polyps, no repeats needed   . Cataract extraction, bilateral Bilateral 2014    per Dr. Gershon Crane   . Coronary artery bypass graft N/A 02/28/2013    Procedure: CORONARY ARTERY BYPASS GRAFTING (CABG);  Surgeon: Rexene Alberts, MD;  Location: [place];  Service: Open Heart Surgery;  Laterality: N/A;  CABG x 2,  using left internal mammary artery and right leg saphenous vein harvested endoscopically  . Intraoperative transesophageal echocardiogram N/A 02/28/2013  Procedure: INTRAOPERATIVE TRANSESOPHAGEAL ECHOCARDIOGRAM;  Surgeon: Rexene Alberts, MD;  Location: [place];  Service: Open Heart Surgery;  Laterality: N/A;  . Maze N/A 02/28/2013    Procedure: MAZE;  Surgeon: Rexene Alberts, MD;  Location: [place];  Service: Open Heart Surgery;  Laterality: N/A;  . Mitral valve repair N/A 02/28/2013    Procedure: MITRAL VALVE REPAIR (MVR);  Surgeon: Rexene Alberts, MD;  Location: [place];  Service: Open Heart Surgery;  Laterality: N/A;  . Cardiac catheterization    . Left heart catheterization with coronary angiogram N/A 02/26/2013    Procedure: LEFT HEART CATHETERIZATION WITH CORONARY ANGIOGRAM;  Surgeon: Leonie Man, MD;  Location: [facility] CATH LAB;  Service: Cardiovascular;  Laterality: N/A;    Family History  Problem Relation Age of Onset  .  Diabetes    . Hypertension    . Lung cancer Brother   . Sudden death    . Heart disease    . Stroke    . Heart disease Son   . Stomach cancer Brother    Social History:  reports that he has never smoked. He has never used smokeless tobacco. He reports that he does not drink alcohol or use illicit drugs.  Allergies: No Known Allergies  OUTPATIENT MEDICATIONS: No current facility-administered medications on file prior to encounter.   Current Outpatient Prescriptions on File Prior to Encounter  Medication Sig Dispense Refill  . aspirin 81 MG tablet Take 81 mg by mouth daily.    [name] atorvastatin (LIPITOR) 10 MG tablet TAKE ONE TABLET BY MOUTH DAILY AT 6 PM. 90 tablet 3  . Cholecalciferol (VITAMIN D) 1000 UNITS capsule Take 2,000 Units by mouth every morning.     . dicyclomine (BENTYL) 20 MG tablet Take 1 tablet (20 mg total) by mouth 3 (three) times daily before meals. 270 tablet 3  . fish oil-omega-3 fatty acids 1000 MG capsule Take 1 g by mouth every morning.     . furosemide (LASIX) 20 MG tablet Take 1 tablet (20 mg total) by mouth daily. 90 tablet 3  . levothyroxine (SYNTHROID, LEVOTHROID) 50 MCG tablet 50 mcg every morning.     . metoprolol tartrate (LOPRESSOR) 25 MG tablet Take 1 tablet (25 mg total) by mouth 2 (two) times daily. 180 tablet 3  . vitamin C (ASCORBIC ACID) 500 MG tablet Take 500 mg by mouth every morning.     . warfarin (COUMADIN) 5 MG tablet Take as directed by anticoagulation clinic (Patient taking differently: Take 5-7.5 mg by mouth See admin instructions. Take 5 mg mon,tues,thurs,sat and Sunday and 7.5 mg on wed and Friday) 90 tablet 0     Results for orders placed or performed during the hospital encounter of 05/03/14 (from the past 48 hour(s))  CBC  (at [facility])     Status: Abnormal   Collection Time: 05/03/14  9:11 AM  Result Value Ref Range   WBC 4.9 4.0 - 10.5 K/uL   RBC 4.60 4.22 - 5.81 MIL/uL   Hemoglobin 14.0 13.0 - 17.0 g/dL   HCT 40.7  39.0 - 52.0 %   MCV 88.5 78.0 - 100.0 fL   MCH 30.4 26.0 - 34.0 pg   MCHC 34.4 30.0 - 36.0 g/dL   RDW 13.2 11.5 - 15.5 %   Platelets 135 (L) 150 - 400 K/uL  Basic metabolic panel  (at [facility])     Status: Abnormal   Collection Time: 05/03/14  9:11 AM  Result Value Ref Range   Sodium 140 135 - 145 mmol/L   Potassium 3.9 3.5 - 5.1 mmol/L   Chloride 105 96 - 112 mmol/L   CO2 26 19 - 32 mmol/L   Glucose, Bld 100 (H) 70 - 99 mg/dL   BUN 15 6 - 23 mg/dL   Creatinine, Ser 0.91 0.50 - 1.35 mg/dL   Calcium 8.7 8.4 - 10.5 mg/dL   GFR calc non Af Amer 76 (L) >90 mL/min   GFR calc Af Amer 88 (L) >90 mL/min    Comment: (NOTE) The eGFR has been calculated using the CKD EPI equation. This calculation has not been validated in all clinical situations. eGFR's persistently <90 mL/min signify possible Chronic Kidney Disease.    Anion gap 9 5 - 15  Protime-INR     Status: Abnormal   Collection Time: 05/03/14  9:11 AM  Result Value Ref Range   Prothrombin Time 21.7 (H) 11.6 - 15.2 seconds   INR 1.87 (H) 0.00 - 1.49  CBG, ED     Status: None   Collection Time: 05/03/14  9:26 AM  Result Value Ref Range   Glucose-Capillary 80 70 - 99 mg/dL  I-stat troponin, ED (not at [facility])     Status: None   Collection Time: 05/03/14  9:28 AM  Result Value Ref Range   Troponin i, poc 0.00 0.00 - 0.08 ng/mL   Comment 3            Comment: Due to the release kinetics of cTnI, a negative result within the first hours of the onset of symptoms does not rule out myocardial infarction with certainty. If myocardial infarction is still suspected, repeat the test at appropriate intervals.   Urinalysis, Routine w reflex microscopic     Status: None   Collection Time: 05/03/14 10:20 AM  Result Value Ref Range   Color, Urine YELLOW YELLOW   APPearance CLEAR CLEAR   Specific Gravity, Urine 1.024 1.005 - 1.030   pH 6.0 5.0 - 8.0   Glucose, UA NEGATIVE NEGATIVE mg/dL   Hgb urine dipstick NEGATIVE NEGATIVE     Bilirubin Urine NEGATIVE NEGATIVE   Ketones, ur NEGATIVE NEGATIVE mg/dL   Protein, ur NEGATIVE NEGATIVE mg/dL   Urobilinogen, UA 0.2 0.0 - 1.0 mg/dL   Nitrite NEGATIVE NEGATIVE   Leukocytes, UA NEGATIVE NEGATIVE    Comment: MICROSCOPIC NOT DONE ON URINES WITH NEGATIVE PROTEIN, BLOOD, LEUKOCYTES, NITRITE, OR GLUCOSE <1000 mg/dL.   Dg Chest 2 View  05/03/2014   CLINICAL DATA:  Dizziness and hypertension  EXAM: CHEST  2 VIEW  COMPARISON:  03/26/2013  FINDINGS: Cardiac shadow is stable. Postoperative changes are again seen. A large hiatal hernia is again noted. The lungs are well aerated bilaterally. No focal infiltrate is noted. Degenerative change of thoracic spine is seen.  IMPRESSION: Large hiatal hernia stable.  No acute abnormality noted.   Electronically Signed   By: Inez Catalina M.D.   On: 05/03/2014 10:18   Ct Head Wo Contrast  05/03/2014   CLINICAL DATA:  Dizziness  EXAM: CT HEAD WITHOUT CONTRAST  TECHNIQUE: Contiguous axial images were obtained from the base of the skull through the vertex without intravenous contrast.  COMPARISON:  CT 03/12/2013  FINDINGS: Generalized atrophy. Chronic microvascular ischemic changes in the white matter.  Negative for acute infarct.  Negative for hemorrhage or mass.  Extensive atherosclerotic calcification.  Calvarium intact.  IMPRESSION: Atrophy and chronic microvascular ischemia.  No acute abnormality.   Electronically Signed  By: Franchot Gallo M.D.   On: 05/03/2014 10:32    ROS: General:+ allergies no colds or fevers, no weight changes Skin:no rashes or ulcers HEENT:no blurred vision, some congestion + sneezing CV:see HPI PUL:see HPI GI:no diarrhea constipation or melena, no indigestion GU:no hematuria, no dysuria MS:no joint pain, no claudication Neuro:no syncope, no lightheadedness- + room spinning dizziness Endo:no diabetes, no thyroid disease   Blood pressure 125/78, pulse 73, temperature 98 F (36.7 C), temperature source Oral, resp.  rate 17, height '5\' 9"'  (1.753 m), weight 184 lb (83.462 kg), SpO2 97 %.  Wt Readings from Last 3 Encounters:  05/03/14 184 lb (83.462 kg)  03/25/14 186 lb (84.369 kg)  01/22/14 186 lb (84.369 kg)    PE: General:Pleasant affect, NAD Skin:Warm and dry, brisk capillary refill HEENT:normocephalic, sclera clear, mucus membranes moist Neck:supple, no JVD, no bruits  Heart:S1S2 RRR without murmur, gallup, rub or click Lungs:clear without rales, rhonchi, or wheezes SEL:TRVU, non tender, + BS, do not palpate liver spleen or masses Ext:no lower ext edema, 2+ pedal pulses, 2+ radial pulses Neuro:alert and oriented X 3, MAE, follows commands, + facial symmetry with smile and frown, upper and lower extremity with equal and strong strength.  Finger nose touch normal.      Assessment/Plan Principal Problem:   Dizziness- with room spinning and now in SR most likley vertigo.  Stable CT of head.  Would add Antivert.    Active Problems:   Paroxysmal atrial fibrillation- maintaining SR with PAC and occ PVC  CHA2DS2VASc score 4   Current use of long term anticoagulation- INR 1.8 last check 2.4 on 04/23/14   S/P CABG x 03-2012   S/P MVR (mitral valve repair)   S/P Maze operation for atrial fibrillation  MD to see and further plans.     [place]  Nurse Practitioner Certified [place] Pager [phone] or after 5pm or weekends call [phone] 05/03/2014, 1:56 PM     Patient seen and examined and history reviewed. Agree with above findings and plan. Very pleasant 81 yo WM with above cardiac history presents with acute vertigo this am. No syncope. No nausea. Has problems with sinus allergies every year at this time. Symptoms improved with meclizine. CT of head without acute change. Ecg shows NSR with PACs (no Afib). Labs are ok. Exam is benign. INR is subtherapeutic. From a cardiac standpoint he is stable to go home. Can use meclizine prn vertigo. Suggested taking Claritin for  sinus allergy. If symptoms persist he may benefit from vestibular exercises. Will arrange follow up with Dr. Harrington Challenger.  Peter Martinique, [place] 05/03/2014 2:29 PM

## 2014-05-03 NOTE — Discharge Instructions (Signed)

## 2014-05-06 ENCOUNTER — Telehealth: Payer: Self-pay

## 2014-05-06 NOTE — Telephone Encounter (Signed)
Pt called and states he was seen in the ED on Friday for vertigo.  Pt is currently on meclizine and pt feels like he needs a decongestant. Pt states he is clogged up, has yellow mucus, sneezing, and a little cough. Asked if pt had tried OTC Mucinex and pt states no but he would like Dr. Sarajane Jews to advise due to being on multiple medications for his heart.

## 2014-05-06 NOTE — Telephone Encounter (Signed)
Per Dr. Sarajane Jews, pt can take plain Mucinex and I spoke with pt and went over this information.

## 2014-05-07 ENCOUNTER — Telehealth: Payer: Self-pay

## 2014-05-07 ENCOUNTER — Other Ambulatory Visit: Payer: Self-pay | Admitting: General Practice

## 2014-05-07 MED ORDER — WARFARIN SODIUM 5 MG PO TABS: ORAL_TABLET | ORAL | Status: DC

## 2014-05-07 NOTE — Telephone Encounter (Signed)
[facility] refill request for WARFARIN 5MG  #[phone]

## 2014-05-08 ENCOUNTER — Other Ambulatory Visit: Payer: Self-pay | Admitting: *Deleted

## 2014-05-08 MED ORDER — LEVOTHYROXINE SODIUM 50 MCG PO TABS: 50.0000 ug | ORAL_TABLET | Freq: Every morning | ORAL | Status: DC

## 2014-05-14 NOTE — Progress Notes (Signed)
Cardiology Office Note   Date:  05/15/2014   ID:  Luis Buba., DOB 18-Jun-1927, MRN [id number]  PCP:  [name], MD  Cardiologist:  Dr. Dorris Carnes     Chief Complaint  Patient presents with  . Hospitalization Follow-up    ER visit for dizziness  . Coronary Artery Disease  . Atrial Fibrillation     History of Present Illness: [name]. is a 81 y.o. male with a hx of PAFib, HTN, HL, hypothyroidism, prostate CA. He was admitted to the hospital 02/2013 with unstable angina. LHC demonstrated 3 v CAD and was referred for CABG + MAZE procedure. Intraoperative TEE demonstrated MVP with mod to severe MR. He underwent CABG (L-LAD, S-OM1), MV repair and MAZE with cryothermy ablation clipping of LAA. Post op course c/b AFib and was placed on Amiodarone. Carotid US (02/27/13): 1-39% bilateral. He was readmitted 02/2013. He presented with weakness and near syncope in the setting of hypotension, delirium (likely postoperative) and mild volume overload in the setting of atrial fibrillation with rapid rate. CT scan was negative for pulmonary embolism.  Last seen by Dr. Dorris Carnes 05/2013.  He was seen in the emergency room by Dr. Martinique 05/03/14 with symptoms of dizziness consistent with BPPV. Head CT was unremarkable. ECG demonstrated NSR. When necessary meclizine was recommended. He returns for follow-up.  He continues to have episodes of dizziness that he describes as spinning.  This occurs while laying flat.  He feels better when he stands up.  He has some increased sinus congestion of late and URI symptoms with a mild cough.  His PCP has him on Mucinex.  He denies syncope, near syncope.  He denies chest pain, shortness of breath, orthopnea, PND, edema.       Studies/Reports Reviewed Today:  Echo 07/02/13 - Mild LVH. There was mild focal basal hypertrophy of the septum. EF 55-60%. Wall motion was normal.  Doppler parameters are consistent with high ventricular filling  pressure. - Aortic valve: Valve mobility was mildly restricted. Mild regurgitation. - Aortic root: The aortic root was mildly dilated. - Ascending aorta: The ascending aorta was mildly dilated. - Mitral valve: Prior procedures included surgical repair.  The findings are consistent with mild stenosis. - Left atrium: The atrium was severely dilated. - Right ventricle: The cavity size was mildly dilated. - Right atrium: The atrium was mildly dilated. - PA peak pressure: 80mm Hg (S). Impressions:  Compared to 03/13/13, no significant change.   Past Medical History  Diagnosis Date  . Personal history of colonic polyps     tubular adenoma  . Hypertension   . Irritable bowel syndrome   . Prostate cancer 1991    sees Dr. Carlota Raspberry at [place], observation only   . S/P mitral valve repair, maze procedure, and CABG x2 02/28/2013    Complex valvuloplasty including triangular resection of posterior leaflet, 30 mm Sorin Memo 3D ring annuloplasty  . S/P CABG x 2 02/28/2013    LIMA to LAD, SVG to OM1, EVH via right thigh  . S/P Maze operation for atrial fibrillation 02/28/2013    Complete bilateral atrial lesion set using bipolar radiofrequency and cryothermy ablation with clipping of LA appendage  . Pleural effusion, bilateral 03/12/2013    Small to moderate, L>R  . Complication of anesthesia     "serious cognisance problems since my OR in January" (03/13/2013  . Coronary artery disease   . PAF (paroxysmal atrial fibrillation) 05/03/2014    Past Surgical  History  Procedure Laterality Date  . Hemorrhoid surgery    . Prostate biopsy    . Colonoscopy  2011    per Dr. Sharlett Iles, benign polyps, no repeats needed   . Cataract extraction, bilateral Bilateral 2014    per Dr. Gershon Crane   . Coronary artery bypass graft N/A 02/28/2013    Procedure: CORONARY ARTERY BYPASS GRAFTING (CABG);  Surgeon: Rexene Alberts, MD;  Location: [facility];  Service: Open Heart Surgery;  Laterality: N/A;  CABG x 2,  using left internal  mammary artery and right leg saphenous vein harvested endoscopically  . Intraoperative transesophageal echocardiogram N/A 02/28/2013    Procedure: INTRAOPERATIVE TRANSESOPHAGEAL ECHOCARDIOGRAM;  Surgeon: Rexene Alberts, MD;  Location: [facility];  Service: Open Heart Surgery;  Laterality: N/A;  . Maze N/A 02/28/2013    Procedure: MAZE;  Surgeon: Rexene Alberts, MD;  Location: [facility];  Service: Open Heart Surgery;  Laterality: N/A;  . Mitral valve repair N/A 02/28/2013    Procedure: MITRAL VALVE REPAIR (MVR);  Surgeon: Rexene Alberts, MD;  Location: [facility];  Service: Open Heart Surgery;  Laterality: N/A;  . Cardiac catheterization    . Left heart catheterization with coronary angiogram N/A 02/26/2013    Procedure: LEFT HEART CATHETERIZATION WITH CORONARY ANGIOGRAM;  Surgeon: Leonie Man, MD;  Location: [facility] CATH LAB;  Service: Cardiovascular;  Laterality: N/A;     Current Outpatient Prescriptions  Medication Sig Dispense Refill  . aspirin 81 MG tablet Take 81 mg by mouth daily.    [name] atorvastatin (LIPITOR) 10 MG tablet TAKE ONE TABLET BY MOUTH DAILY AT 6 PM. 90 tablet 3  . Cholecalciferol (VITAMIN D3 PO) Take 2,000 Units by mouth daily.    [name] dicyclomine (BENTYL) 20 MG tablet Take 1 tablet (20 mg total) by mouth 3 (three) times daily before meals. 270 tablet 3  . Fexofenadine HCl (MUCINEX ALLERGY PO) Take 1 tablet by mouth as needed (for congestion).     . fish oil-omega-3 fatty acids 1000 MG capsule Take 1 g by mouth every morning.     . furosemide (LASIX) 20 MG tablet Take 1 tablet (20 mg total) by mouth daily. 90 tablet 3  . levothyroxine (SYNTHROID, LEVOTHROID) 50 MCG tablet Take 1 tablet (50 mcg total) by mouth every morning. 90 tablet 3  . meclizine (ANTIVERT) 25 MG tablet Take 1 tablet (25 mg total) by mouth 3 (three) times daily as needed. 20 tablet 0  . metoprolol tartrate (LOPRESSOR) 25 MG tablet Take 1 tablet (25 mg total) by mouth 2 (two) times daily. 180 tablet 3  . vitamin C (ASCORBIC ACID)  500 MG tablet Take 500 mg by mouth every morning.     . warfarin (COUMADIN) 5 MG tablet Take as directed by anticoagulation clinic 105 tablet 1   No current facility-administered medications for this visit.    Allergies:   Review of patient's allergies indicates no known allergies.    Social History:  The patient  reports that he has never smoked. He has never used smokeless tobacco. He reports that he does not drink alcohol or use illicit drugs.   Family History:  The patient's family history includes Cancer in his brother and daughter; Diabetes in his sister and another family member; Heart disease in his son and another family member; Hypertension in his father, mother, and another family member; Lung cancer in his brother; Stomach cancer in his brother; Stroke in an other family member; Sudden death in his father,  mother, and another family member.    ROS:   Please see the history of present illness.   Review of Systems  Constitution: Negative for fever.  HENT: Positive for congestion and nosebleeds.   Respiratory: Positive for cough. Negative for wheezing.        Yellow - taking mucinex  Gastrointestinal: Positive for diarrhea. Negative for hematochezia, melena and vomiting.       +IBS  Neurological: Positive for vertigo.  All other systems reviewed and are negative.    PHYSICAL EXAM: VS:  BP 110/70 mmHg  Pulse 71  Ht 5\' 9"  (1.753 m)  Wt 182 lb (82.555 kg)  BMI 26.86 kg/m2    Wt Readings from Last 3 Encounters:  05/15/14 182 lb (82.555 kg)  05/03/14 184 lb (83.462 kg)  03/25/14 186 lb (84.369 kg)     GEN: Well nourished, well developed, in no acute distress HEENT: normal Neck: no JVD, no masses Cardiac:  Normal S1/S2, RRR; no murmur ,  no rubs or gallops, no edema  Respiratory:  clear to auscultation bilaterally, no wheezing, rhonchi or rales. GI: soft, nontender, nondistended, + BS MS: no deformity or atrophy Skin: warm and dry  Neuro:  CNs II-XII intact,  Strength and sensation are intact Psych: Normal affect   EKG:  EKG is ordered today.  It demonstrates:   NSR, HR 71, first degree AV block, PR 220 ms, no change from prior tracing   Recent Labs: 01/15/2014: ALT 21; TSH 3.93 05/03/2014: BUN 15; Creatinine 0.91; Hemoglobin 14.0; Platelets 135*; Potassium 3.9; Sodium 140    Lipid Panel    Component Value Date/Time   CHOL 121 01/15/2014 0805   TRIG 50.0 01/15/2014 0805   HDL 38.90* 01/15/2014 0805   CHOLHDL 3 01/15/2014 0805   VLDL 10.0 01/15/2014 0805   LDLCALC 72 01/15/2014 0805   LDLDIRECT 159.3 05/17/2007 0845      ASSESSMENT AND PLAN:  Dizziness He has symptoms of BPPV.  Meclizine is helping.  His symptoms are improved.  We discussed referral to [facility].  He would like see if it resolves on it's own.   I advised him to contact his PCP or [place] for a referral in the next 2 weeks if it is not resolved.  Coronary artery disease involving native coronary artery of native heart without angina pectoris No angina.  Continue beta-blocker, statin, ASA.  PAF (paroxysmal atrial fibrillation), CHA2DS2VASc score 4 Maintaining NSR s/p Maze.  Continue coumadin.  Essential hypertension Controlled on Metoprolol.  HLD (hyperlipidemia) Continue Lipitor.  Recent LDL ok.  Chronic diastolic CHF (congestive heart failure) Stable volume.  Continue current Rx.  S/P MVR (mitral valve repair)  Continue SBE prophylaxis.  Arrange FU echo in May 2016.     Current medicines are reviewed at length with the patient today.  The patient does not have concerns regarding medicines.  The following changes have been made:  no change  Labs/ tests ordered today include:  No orders of the defined types were placed in this encounter.    Disposition:   FU with Dr. Dorris Carnes 07/2014.   Signed, Versie Starks, MHS 05/15/2014 3:13 PM    [facility] [address], [place], [place]  [address] Phone: [phone]; Fax:  [phone]

## 2014-05-15 ENCOUNTER — Encounter: Payer: Self-pay | Admitting: Physician Assistant

## 2014-05-15 ENCOUNTER — Ambulatory Visit (INDEPENDENT_AMBULATORY_CARE_PROVIDER_SITE_OTHER): Payer: Medicare HMO | Admitting: Physician Assistant

## 2014-05-15 VITALS — BP 110/70 | HR 71 | Ht 69.0 in | Wt 182.0 lb

## 2014-05-15 DIAGNOSIS — I2510 Atherosclerotic heart disease of native coronary artery without angina pectoris: Secondary | ICD-10-CM | POA: Diagnosis not present

## 2014-05-15 DIAGNOSIS — I10 Essential (primary) hypertension: Secondary | ICD-10-CM

## 2014-05-15 DIAGNOSIS — Z8679 Personal history of other diseases of the circulatory system: Secondary | ICD-10-CM

## 2014-05-15 DIAGNOSIS — Z9889 Other specified postprocedural states: Secondary | ICD-10-CM | POA: Diagnosis not present

## 2014-05-15 DIAGNOSIS — R42 Dizziness and giddiness: Secondary | ICD-10-CM

## 2014-05-15 DIAGNOSIS — I480 Paroxysmal atrial fibrillation: Secondary | ICD-10-CM | POA: Diagnosis not present

## 2014-05-15 DIAGNOSIS — E785 Hyperlipidemia, unspecified: Secondary | ICD-10-CM

## 2014-05-15 DIAGNOSIS — I5032 Chronic diastolic (congestive) heart failure: Secondary | ICD-10-CM

## 2014-05-15 NOTE — Patient Instructions (Signed)
Your physician has requested that you have an echocardiogram TO BE DONE IN MAY 2016. Echocardiography is a painless test that uses sound waves to create images of your heart. It provides your doctor with information about the size and shape of your heart and how well your heart's chambers and valves are working. This procedure takes approximately one hour. There are no restrictions for this procedure.  RESCHEDULE DR. ROSS TO June 2016  CALL IF VERTIGO CONTINUES SO THAT WE OR DR. Sarajane Jews MAY REFER YOU FOR VESTIBULAR REHAB

## 2014-05-31 ENCOUNTER — Telehealth: Payer: Self-pay

## 2014-06-04 ENCOUNTER — Ambulatory Visit (INDEPENDENT_AMBULATORY_CARE_PROVIDER_SITE_OTHER): Payer: Medicare HMO | Admitting: General Practice

## 2014-06-04 DIAGNOSIS — Z5181 Encounter for therapeutic drug level monitoring: Secondary | ICD-10-CM | POA: Diagnosis not present

## 2014-06-04 LAB — POCT INR: INR: 3

## 2014-06-04 NOTE — Progress Notes (Signed)
Pre visit review using our clinic review tool, if applicable. No additional management support is needed unless otherwise documented below in the visit note. 

## 2014-06-04 NOTE — Progress Notes (Signed)
Agree with plan 

## 2014-06-05 NOTE — Telephone Encounter (Signed)
Error; no contact made

## 2014-06-07 ENCOUNTER — Ambulatory Visit: Payer: Medicare HMO | Admitting: Internal Medicine

## 2014-07-02 ENCOUNTER — Other Ambulatory Visit (HOSPITAL_COMMUNITY): Payer: Medicare HMO

## 2014-07-02 ENCOUNTER — Other Ambulatory Visit: Payer: Self-pay

## 2014-07-02 ENCOUNTER — Ambulatory Visit (HOSPITAL_COMMUNITY): Payer: Medicare HMO | Attending: Physician Assistant

## 2014-07-02 ENCOUNTER — Encounter: Payer: Self-pay | Admitting: Physician Assistant

## 2014-07-02 DIAGNOSIS — I4891 Unspecified atrial fibrillation: Secondary | ICD-10-CM | POA: Diagnosis not present

## 2014-07-02 DIAGNOSIS — I059 Rheumatic mitral valve disease, unspecified: Secondary | ICD-10-CM | POA: Insufficient documentation

## 2014-07-02 DIAGNOSIS — Z9889 Other specified postprocedural states: Secondary | ICD-10-CM

## 2014-07-02 DIAGNOSIS — Z8679 Personal history of other diseases of the circulatory system: Secondary | ICD-10-CM

## 2014-07-03 ENCOUNTER — Telehealth: Payer: Self-pay | Admitting: *Deleted

## 2014-07-03 ENCOUNTER — Other Ambulatory Visit: Payer: Self-pay | Admitting: Family Medicine

## 2014-07-03 ENCOUNTER — Telehealth: Payer: Self-pay | Admitting: Family Medicine

## 2014-07-03 DIAGNOSIS — R972 Elevated prostate specific antigen [PSA]: Secondary | ICD-10-CM

## 2014-07-03 NOTE — Telephone Encounter (Signed)
Per Dr. Sarajane Jews okay to order lab. I did put in a future order and spoke with pt.

## 2014-07-03 NOTE — Telephone Encounter (Signed)
Pt requesting a lab order for PSA and usually goes to [facility].

## 2014-07-03 NOTE — Telephone Encounter (Signed)
Reached pt today on his cell #. Pt notified of echo results by phone today with verbal understanding. Verified Dr. Harrington Challenger appt in June 2016. Pt asked for echo results to be mailed to him, will put in mail today to verified address for pt.

## 2014-07-09 ENCOUNTER — Other Ambulatory Visit (INDEPENDENT_AMBULATORY_CARE_PROVIDER_SITE_OTHER): Payer: Medicare HMO

## 2014-07-09 DIAGNOSIS — R972 Elevated prostate specific antigen [PSA]: Secondary | ICD-10-CM

## 2014-07-09 LAB — PSA: PSA: 18.78 ng/mL — ABNORMAL HIGH (ref 0.10–4.00)

## 2014-07-16 ENCOUNTER — Ambulatory Visit (INDEPENDENT_AMBULATORY_CARE_PROVIDER_SITE_OTHER): Payer: Medicare HMO | Admitting: General Practice

## 2014-07-16 DIAGNOSIS — Z5181 Encounter for therapeutic drug level monitoring: Secondary | ICD-10-CM | POA: Diagnosis not present

## 2014-07-16 LAB — POCT INR: INR: 2.9

## 2014-07-16 NOTE — Progress Notes (Signed)
Pre visit review using our clinic review tool, if applicable. No additional management support is needed unless otherwise documented below in the visit note. 

## 2014-07-16 NOTE — Progress Notes (Signed)
Agree with plan 

## 2014-08-03 ENCOUNTER — Other Ambulatory Visit: Payer: Self-pay | Admitting: Family Medicine

## 2014-08-05 ENCOUNTER — Other Ambulatory Visit: Payer: Self-pay | Admitting: General Practice

## 2014-08-05 MED ORDER — WARFARIN SODIUM 5 MG PO TABS: ORAL_TABLET | ORAL | Status: DC

## 2014-08-15 ENCOUNTER — Ambulatory Visit (INDEPENDENT_AMBULATORY_CARE_PROVIDER_SITE_OTHER): Payer: Medicare HMO | Admitting: Internal Medicine

## 2014-08-15 ENCOUNTER — Encounter: Payer: Self-pay | Admitting: Internal Medicine

## 2014-08-15 VITALS — BP 104/50 | HR 64 | Ht 69.0 in | Wt 181.4 lb

## 2014-08-15 DIAGNOSIS — I10 Essential (primary) hypertension: Secondary | ICD-10-CM

## 2014-08-15 DIAGNOSIS — E785 Hyperlipidemia, unspecified: Secondary | ICD-10-CM | POA: Diagnosis not present

## 2014-08-15 NOTE — Progress Notes (Signed)
Cardiology Office Note   Date:  08/15/2014   ID:  Luis Buba., DOB June 08, 1927, MRN [id number]  PCP:  [name], MD  Cardiologist:   Dorris Carnes, MD   No chief complaint on file.     History of Present Illness: Luis Furry. is a 81 y.o. male with a history of PAF, HTN, HL, hypothyroidism, prostate CA. He was admitted to the hospital 02/2013 with unstable angina. LHC demonstrated 3 v CAD and was referred for CABG + MAZE procedure. Intraoperative TEE demonstrated MVP with mod to severe MR. He underwent CABG (L-LAD, S-OM1), MV repair and MAZE with cryothermy ablation clipping of LAA. Post op course c/b AFib and was placed on Amiodarone. Carotid US (02/27/13): 1-39% bilateral. He was readmitted 02/2013. He presented with weakness and near syncope in the setting of hypotension, delirium (likely postoperative) and mild volume overload in the setting of atrial fibrillation with rapid rate. CT scan was negative for pulmonary embolism. Has had episdoes of BPV befoere.    Since seen in clinic breathig is OK  No dizziness Episode of dizziness in march  Pt feels that it was due to allergies.   No palpitations   No Cp     Current Outpatient Prescriptions  Medication Sig Dispense Refill  . amoxicillin (AMOXIL) 500 MG capsule TAKE 4 CAPSULES BY MOUTH 1 HOUR prior TO dental appointment.  1  . aspirin 81 MG tablet Take 81 mg by mouth daily.    [name] atorvastatin (LIPITOR) 10 MG tablet TAKE ONE TABLET BY MOUTH DAILY AT 6 PM. 90 tablet 3  . Cholecalciferol (VITAMIN D3 PO) Take 2,000 Units by mouth daily.    . fish oil-omega-3 fatty acids 1000 MG capsule Take 1 g by mouth every morning.     . furosemide (LASIX) 20 MG tablet Take 1 tablet (20 mg total) by mouth daily. 90 tablet 3  . levothyroxine (SYNTHROID, LEVOTHROID) 50 MCG tablet Take 1 tablet (50 mcg total) by mouth every morning. 90 tablet 3  . meclizine (ANTIVERT) 25 MG tablet Take 1 tablet (25 mg total) by mouth 3 (three) times  daily as needed. 20 tablet 0  . metoprolol tartrate (LOPRESSOR) 25 MG tablet Take 1 tablet (25 mg total) by mouth 2 (two) times daily. 180 tablet 3  . vitamin C (ASCORBIC ACID) 500 MG tablet Take 500 mg by mouth every morning.     . warfarin (COUMADIN) 5 MG tablet Take as directed by anticoagulation clinic 105 tablet 1   No current facility-administered medications for this visit.    Allergies:   Other   Past Medical History  Diagnosis Date  . Personal history of colonic polyps     tubular adenoma  . Hypertension   . Irritable bowel syndrome   . Prostate cancer 1991    sees Dr. Carlota Raspberry at [place], observation only   . S/P mitral valve repair, maze procedure, and CABG x2 02/28/2013    Complex valvuloplasty including triangular resection of posterior leaflet, 30 mm Sorin Memo 3D ring annuloplasty  . S/P CABG x 2 02/28/2013    LIMA to LAD, SVG to OM1, EVH via right thigh  . S/P Maze operation for atrial fibrillation 02/28/2013    Complete bilateral atrial lesion set using bipolar radiofrequency and cryothermy ablation with clipping of LA appendage  . Pleural effusion, bilateral 03/12/2013    Small to moderate, L>R  . Complication of anesthesia     "serious cognisance problems since my OR  in January" (03/13/2013  . Coronary artery disease   . PAF (paroxysmal atrial fibrillation) 05/03/2014  . Hx of echocardiogram     Echo 5/16:  Mild focal basal septal hypertrophy, EF 55%, mild AI, MV repair ok with trivial MR (mean 3 mmHg), mild LAE, mild RAE, PASP 35 mmHg    Past Surgical History  Procedure Laterality Date  . Hemorrhoid surgery    . Prostate biopsy    . Colonoscopy  2011    per Dr. Sharlett Iles, benign polyps, no repeats needed   . Cataract extraction, bilateral Bilateral 2014    per Dr. Gershon Crane   . Coronary artery bypass graft N/A 02/28/2013    Procedure: CORONARY ARTERY BYPASS GRAFTING (CABG);  Surgeon: Rexene Alberts, MD;  Location: [facility];  Service: Open Heart Surgery;  Laterality:  N/A;  CABG x 2,  using left internal mammary artery and right leg saphenous vein harvested endoscopically  . Intraoperative transesophageal echocardiogram N/A 02/28/2013    Procedure: INTRAOPERATIVE TRANSESOPHAGEAL ECHOCARDIOGRAM;  Surgeon: Rexene Alberts, MD;  Location: [facility];  Service: Open Heart Surgery;  Laterality: N/A;  . Maze N/A 02/28/2013    Procedure: MAZE;  Surgeon: Rexene Alberts, MD;  Location: [facility];  Service: Open Heart Surgery;  Laterality: N/A;  . Mitral valve repair N/A 02/28/2013    Procedure: MITRAL VALVE REPAIR (MVR);  Surgeon: Rexene Alberts, MD;  Location: [facility];  Service: Open Heart Surgery;  Laterality: N/A;  . Cardiac catheterization    . Left heart catheterization with coronary angiogram N/A 02/26/2013    Procedure: LEFT HEART CATHETERIZATION WITH CORONARY ANGIOGRAM;  Surgeon: Leonie Man, MD;  Location: [facility] CATH LAB;  Service: Cardiovascular;  Laterality: N/A;     Social History:  The patient  reports that he has never smoked. He has never used smokeless tobacco. He reports that he does not drink alcohol or use illicit drugs.   Family History:  The patient's family history includes Cancer in his brother and daughter; Diabetes in his sister and another family member; Heart disease in his son and another family member; Hypertension in his father, mother, and another family member; Lung cancer in his brother; Stomach cancer in his brother; Stroke in an other family member; Sudden death in his father, mother, and another family member.    ROS:  Please see the history of present illness. All other systems are reviewed and  Negative to the above problem except as noted.    PHYSICAL EXAM: VS:  BP 104/50 mmHg  Pulse 64  Ht 5\' 9"  (1.753 m)  Wt 181 lb 6.4 oz (82.283 kg)  BMI 26.78 kg/m2  SpO2 96%  GEN: Well nourished, well developed, in no acute distress HEENT: normal Neck: no JVD, carotid bruits, or masses Cardiac: RRR; no murmurs, rubs, or gallops,no edema    Respiratory:  clear to auscultation bilaterally, normal work of breathing GI: soft, nontender, nondistended, + BS  No hepatomegaly  MS: no deformity Moving all extremities   Skin: warm and dry, no rash Neuro:  Strength and sensation are intact Psych: euthymic mood, full affect   EKG:  EKG is not ordered today.   Lipid Panel    Component Value Date/Time   CHOL 121 01/15/2014 0805   TRIG 50.0 01/15/2014 0805   HDL 38.90* 01/15/2014 0805   CHOLHDL 3 01/15/2014 0805   VLDL 10.0 01/15/2014 0805   LDLCALC 72 01/15/2014 0805   LDLDIRECT 159.3 05/17/2007 0845  Wt Readings from Last 3 Encounters:  08/15/14 181 lb 6.4 oz (82.283 kg)  05/15/14 182 lb (82.555 kg)  05/03/14 184 lb (83.462 kg)      ASSESSMENT AND PLAN:  1.  CAD  No CP  No SOB   2. MV dz  S/p MVRepair   Valve sounds good    3.  Afib S/p MAZE  Has seen C Owen    HL  Contineu statin    5.  Dizziness  Rare episode      Disposition:   FU with me in 6 mon  jan  Signed, Dorris Carnes, MD  08/15/2014 10:44 AM    [facility] [address], [place], [place]  [address] Phone: [phone]; Fax: [phone]

## 2014-08-27 ENCOUNTER — Ambulatory Visit (INDEPENDENT_AMBULATORY_CARE_PROVIDER_SITE_OTHER): Payer: Medicare HMO | Admitting: General Practice

## 2014-08-27 DIAGNOSIS — Z5181 Encounter for therapeutic drug level monitoring: Secondary | ICD-10-CM

## 2014-08-27 LAB — POCT INR: INR: 4.4

## 2014-08-27 NOTE — Progress Notes (Signed)
Pre visit review using our clinic review tool, if applicable. No additional management support is needed unless otherwise documented below in the visit note. 

## 2014-08-27 NOTE — Progress Notes (Signed)
I have reviewed and agree with the plan. 

## 2014-09-17 ENCOUNTER — Ambulatory Visit (INDEPENDENT_AMBULATORY_CARE_PROVIDER_SITE_OTHER): Payer: Medicare HMO | Admitting: General Practice

## 2014-09-17 DIAGNOSIS — Z5181 Encounter for therapeutic drug level monitoring: Secondary | ICD-10-CM

## 2014-09-17 LAB — POCT INR: INR: 3.4

## 2014-09-17 NOTE — Progress Notes (Signed)
I have reviewed and agree with the plan. 

## 2014-09-17 NOTE — Progress Notes (Signed)
Pre visit review using our clinic review tool, if applicable. No additional management support is needed unless otherwise documented below in the visit note. 

## 2014-10-15 ENCOUNTER — Ambulatory Visit (INDEPENDENT_AMBULATORY_CARE_PROVIDER_SITE_OTHER): Payer: Medicare HMO | Admitting: General Practice

## 2014-10-15 DIAGNOSIS — Z5181 Encounter for therapeutic drug level monitoring: Secondary | ICD-10-CM | POA: Diagnosis not present

## 2014-10-15 LAB — POCT INR: INR: 2.1

## 2014-10-15 NOTE — Progress Notes (Signed)
I have reviewed and agree with the plan. 

## 2014-10-15 NOTE — Progress Notes (Signed)
Pre visit review using our clinic review tool, if applicable. No additional management support is needed unless otherwise documented below in the visit note. 

## 2014-10-29 ENCOUNTER — Other Ambulatory Visit: Payer: Self-pay | Admitting: Family Medicine

## 2014-11-11 ENCOUNTER — Ambulatory Visit (INDEPENDENT_AMBULATORY_CARE_PROVIDER_SITE_OTHER): Payer: Medicare HMO | Admitting: Family Medicine

## 2014-11-11 DIAGNOSIS — Z23 Encounter for immunization: Secondary | ICD-10-CM | POA: Diagnosis not present

## 2014-11-12 ENCOUNTER — Ambulatory Visit (INDEPENDENT_AMBULATORY_CARE_PROVIDER_SITE_OTHER): Payer: Medicare HMO | Admitting: General Practice

## 2014-11-12 DIAGNOSIS — Z5181 Encounter for therapeutic drug level monitoring: Secondary | ICD-10-CM | POA: Diagnosis not present

## 2014-11-12 LAB — POCT INR: INR: 2.1

## 2014-11-12 NOTE — Progress Notes (Signed)
Pre visit review using our clinic review tool, if applicable. No additional management support is needed unless otherwise documented below in the visit note. 

## 2014-11-13 NOTE — Progress Notes (Signed)
I have reviewed and agree with the plan. 

## 2014-12-10 ENCOUNTER — Ambulatory Visit (INDEPENDENT_AMBULATORY_CARE_PROVIDER_SITE_OTHER): Payer: Medicare HMO | Admitting: General Practice

## 2014-12-10 DIAGNOSIS — Z5181 Encounter for therapeutic drug level monitoring: Secondary | ICD-10-CM | POA: Diagnosis not present

## 2014-12-10 LAB — POCT INR: INR: 2

## 2014-12-10 NOTE — Progress Notes (Signed)
I have reviewed and agree with the plan. 

## 2014-12-10 NOTE — Progress Notes (Signed)
Pre visit review using our clinic review tool, if applicable. No additional management support is needed unless otherwise documented below in the visit note. 

## 2014-12-13 ENCOUNTER — Telehealth: Payer: Self-pay | Admitting: Family Medicine

## 2014-12-13 NOTE — Telephone Encounter (Signed)
Pt is schedule for 12/2 for physical and would like to go to [facility] for cpx labs.

## 2014-12-17 ENCOUNTER — Other Ambulatory Visit: Payer: Self-pay | Admitting: Family Medicine

## 2014-12-17 DIAGNOSIS — Z0000 Encounter for general adult medical examination without abnormal findings: Secondary | ICD-10-CM

## 2014-12-17 NOTE — Telephone Encounter (Signed)
Per Dr. Sarajane Jews okay to order labs, plus a PSA. I did put future lab order in computer for [place] and I spoke with pt.

## 2014-12-18 DIAGNOSIS — D225 Melanocytic nevi of trunk: Secondary | ICD-10-CM | POA: Diagnosis not present

## 2014-12-18 DIAGNOSIS — D1801 Hemangioma of skin and subcutaneous tissue: Secondary | ICD-10-CM | POA: Diagnosis not present

## 2014-12-18 DIAGNOSIS — L820 Inflamed seborrheic keratosis: Secondary | ICD-10-CM | POA: Diagnosis not present

## 2014-12-18 DIAGNOSIS — L821 Other seborrheic keratosis: Secondary | ICD-10-CM | POA: Diagnosis not present

## 2014-12-20 DIAGNOSIS — E039 Hypothyroidism, unspecified: Secondary | ICD-10-CM | POA: Diagnosis not present

## 2014-12-20 DIAGNOSIS — E782 Mixed hyperlipidemia: Secondary | ICD-10-CM | POA: Diagnosis not present

## 2014-12-20 DIAGNOSIS — I10 Essential (primary) hypertension: Secondary | ICD-10-CM | POA: Diagnosis not present

## 2015-01-18 IMAGING — CT CT HEAD W/O CM
2 series · 17 of 30 positions shown, 20 images · non-contrast
Comparison: None.

CLINICAL DATA: Fell.  Hit head.

CT HEAD WITHOUT CONTRAST
TECHNIQUE: Contiguous axial images were obtained from the base of
the skull through the vertex without contrast.

[Series 2: bone windows · axial · 0.50mm/px · z∈[-284,-173]mm · 8 of 49 slices shown]
[im 6/49  bone]
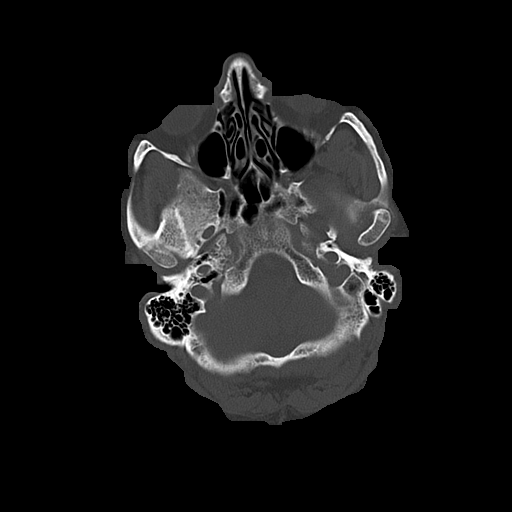
[im 11/49  bone]
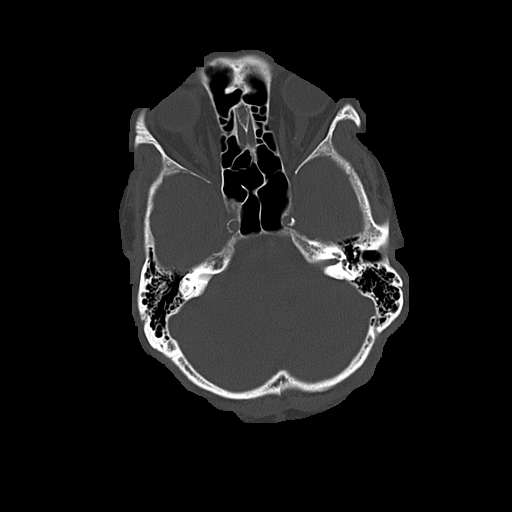
[im 17/49  bone]
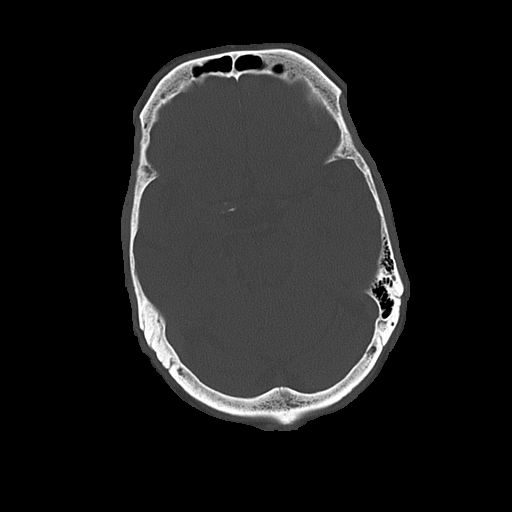
[im 22/49  bone]
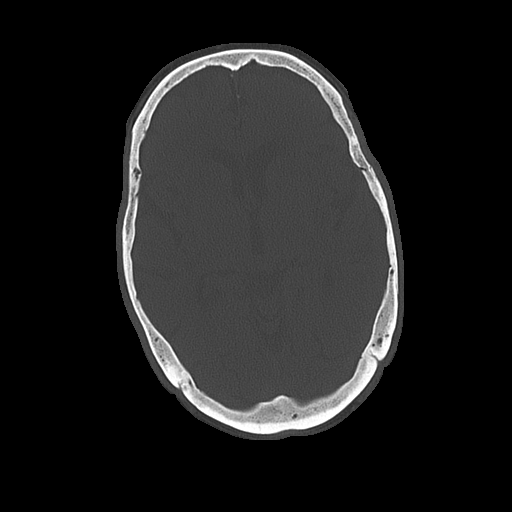
[im 27/49  bone]
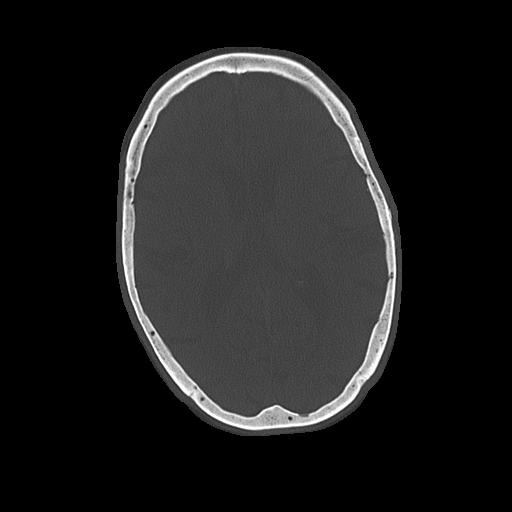
[im 33/49  bone]
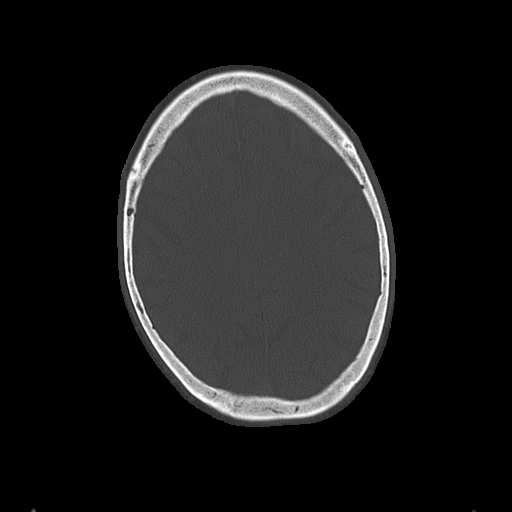
[im 38/49  bone]
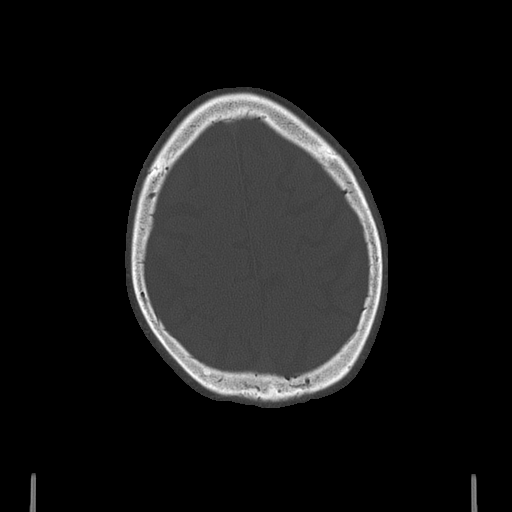
[im 43/49  bone]
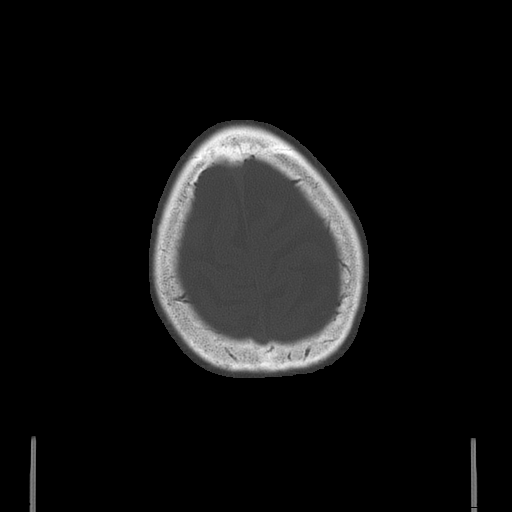

[Series 3: head w/o · axial · non-contrast · 0.50mm/px · z∈[-289,-169]mm · 9 of 30 slices shown, 12 images]
[im 3/30  brain]
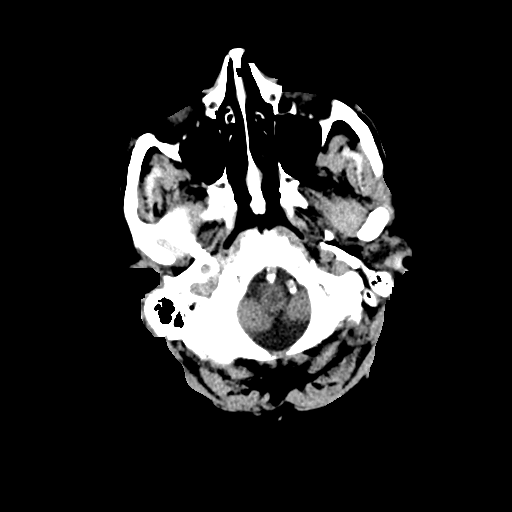
[im 3/30  bone]
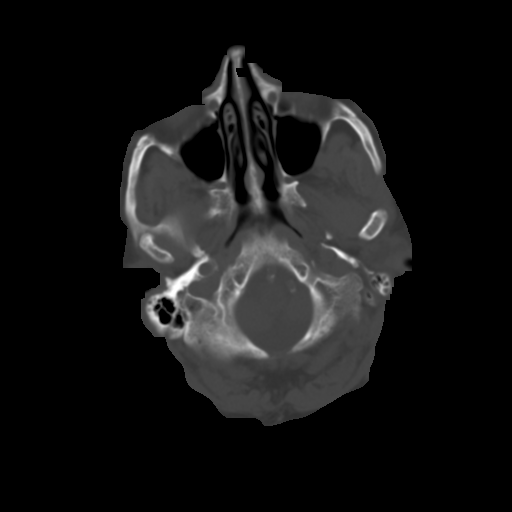
[im 6/30  brain]
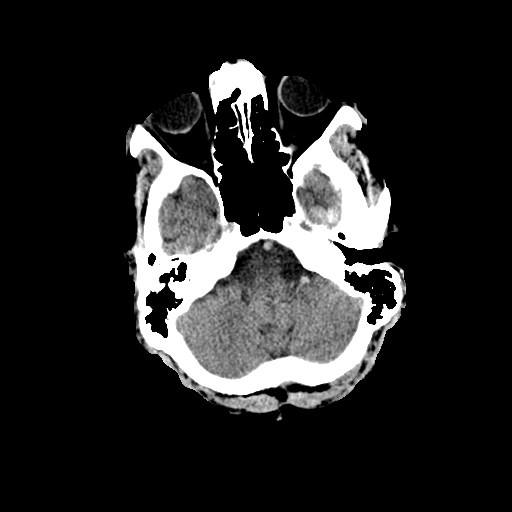
[im 9/30  brain]
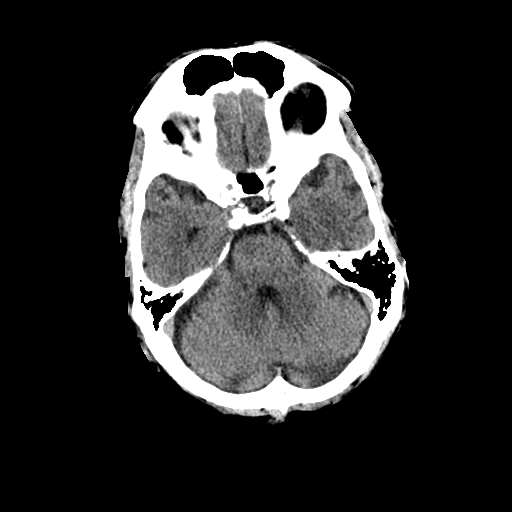
[im 12/30  brain]
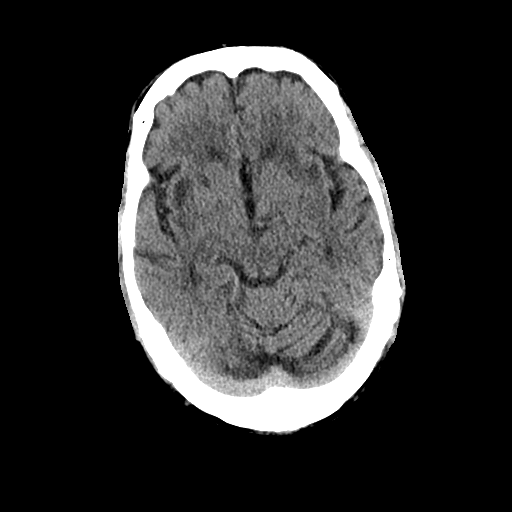
[im 15/30  brain]
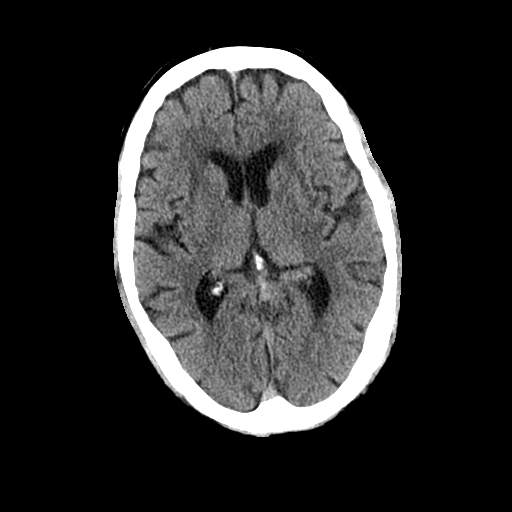
[im 15/30  bone]
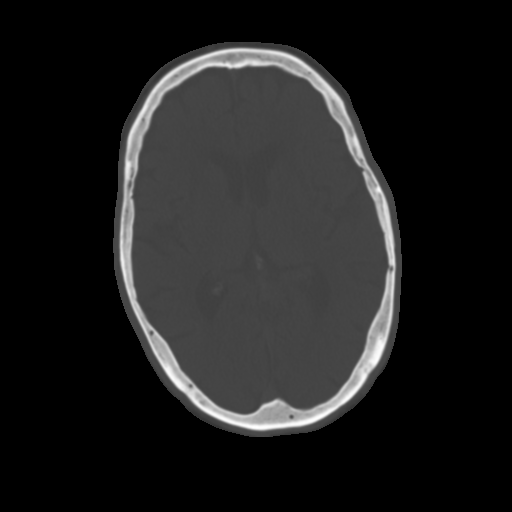
[im 18/30  brain]
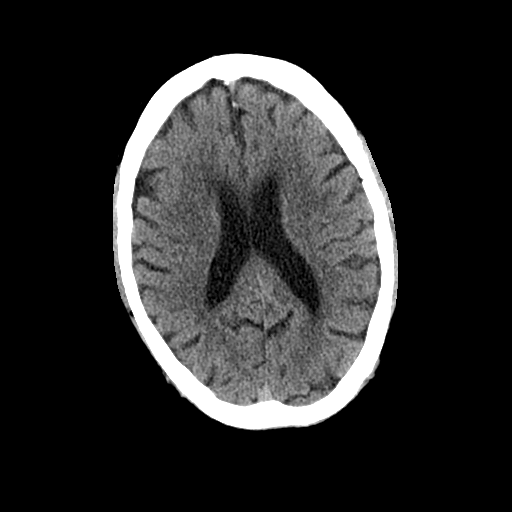
[im 21/30  brain]
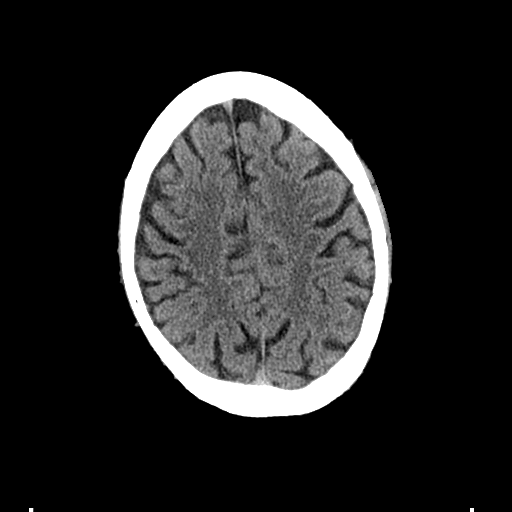
[im 24/30  brain]
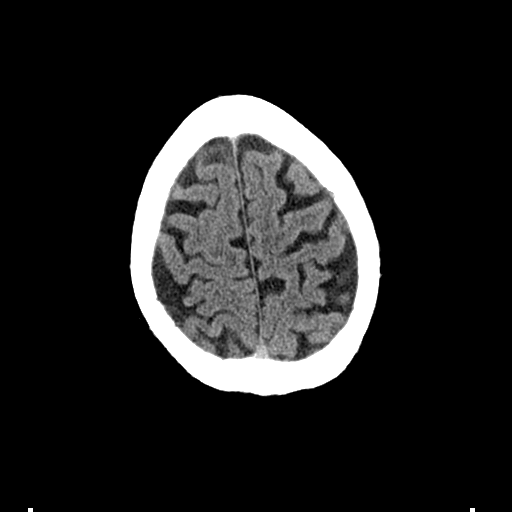
[im 27/30  brain]
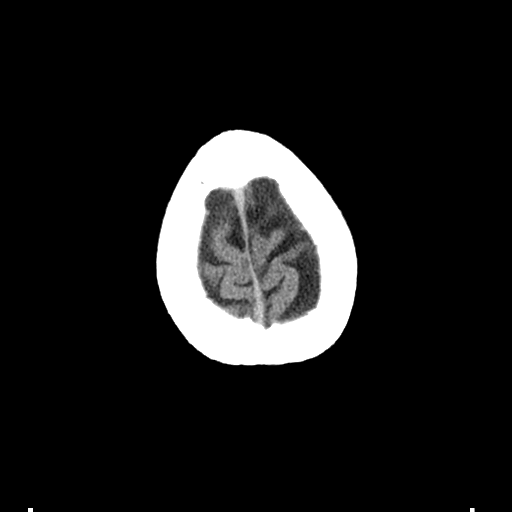
[im 27/30  bone]
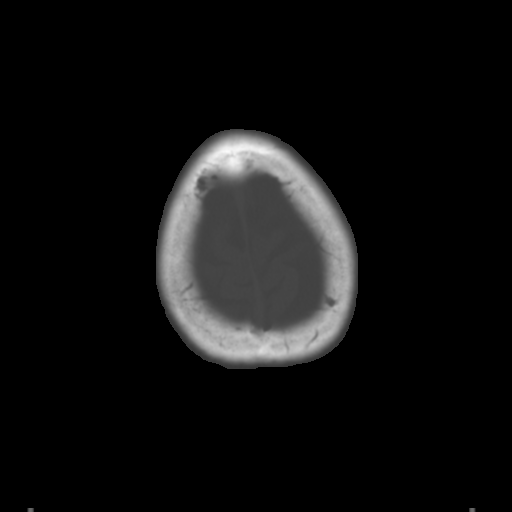

[17 of 30 positions shown; findings below may reference images not displayed]

FINDINGS: The ventricles are normal for age.  No extra-axial fluid
collections are seen.  The brainstem and cerebellum are
unremarkable. Moderate periventricular white matter disease.  No
acute intracranial findings such as infarction or hemorrhage.  No
mass lesions. Dense vascular calcifications.

The bony calvarium is intact.  The visualized paranasal sinuses and
mastoid air cells are clear.
IMPRESSION: No acute intracranial findings or skull fracture.

## 2015-01-19 IMAGING — CR DG WRIST COMPLETE 3+V*R*
4 series · 4 of 4 positions shown · non-contrast
Comparison: None.

CLINICAL DATA: Right wrist pain after fall

RIGHT WRIST - COMPLETE 3+ VIEW

[x wrist pa right]
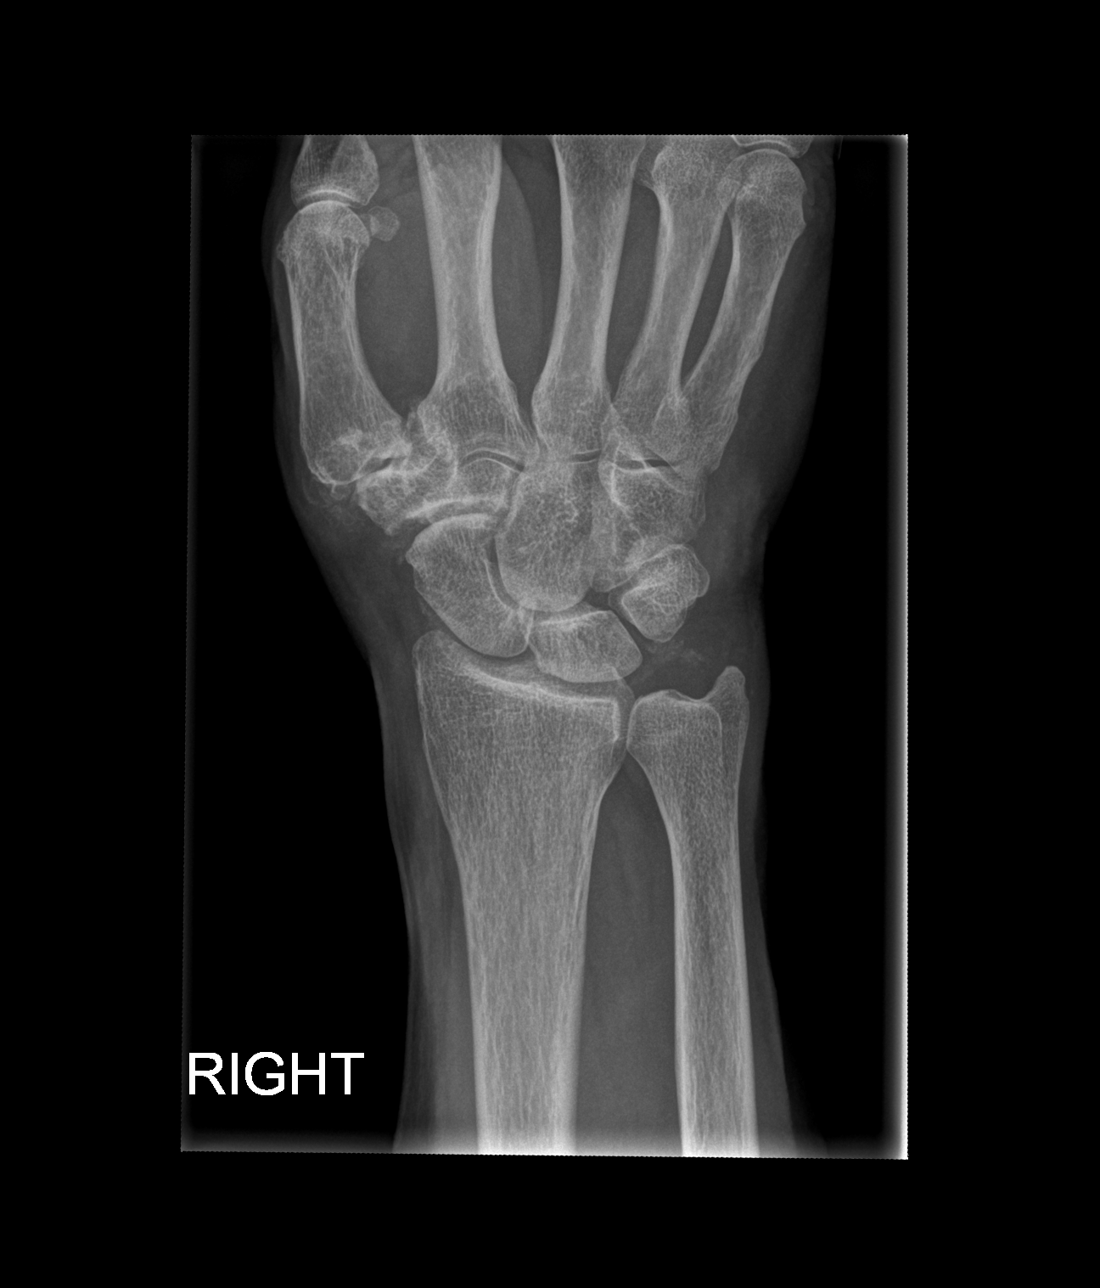

[x wrist obl right]
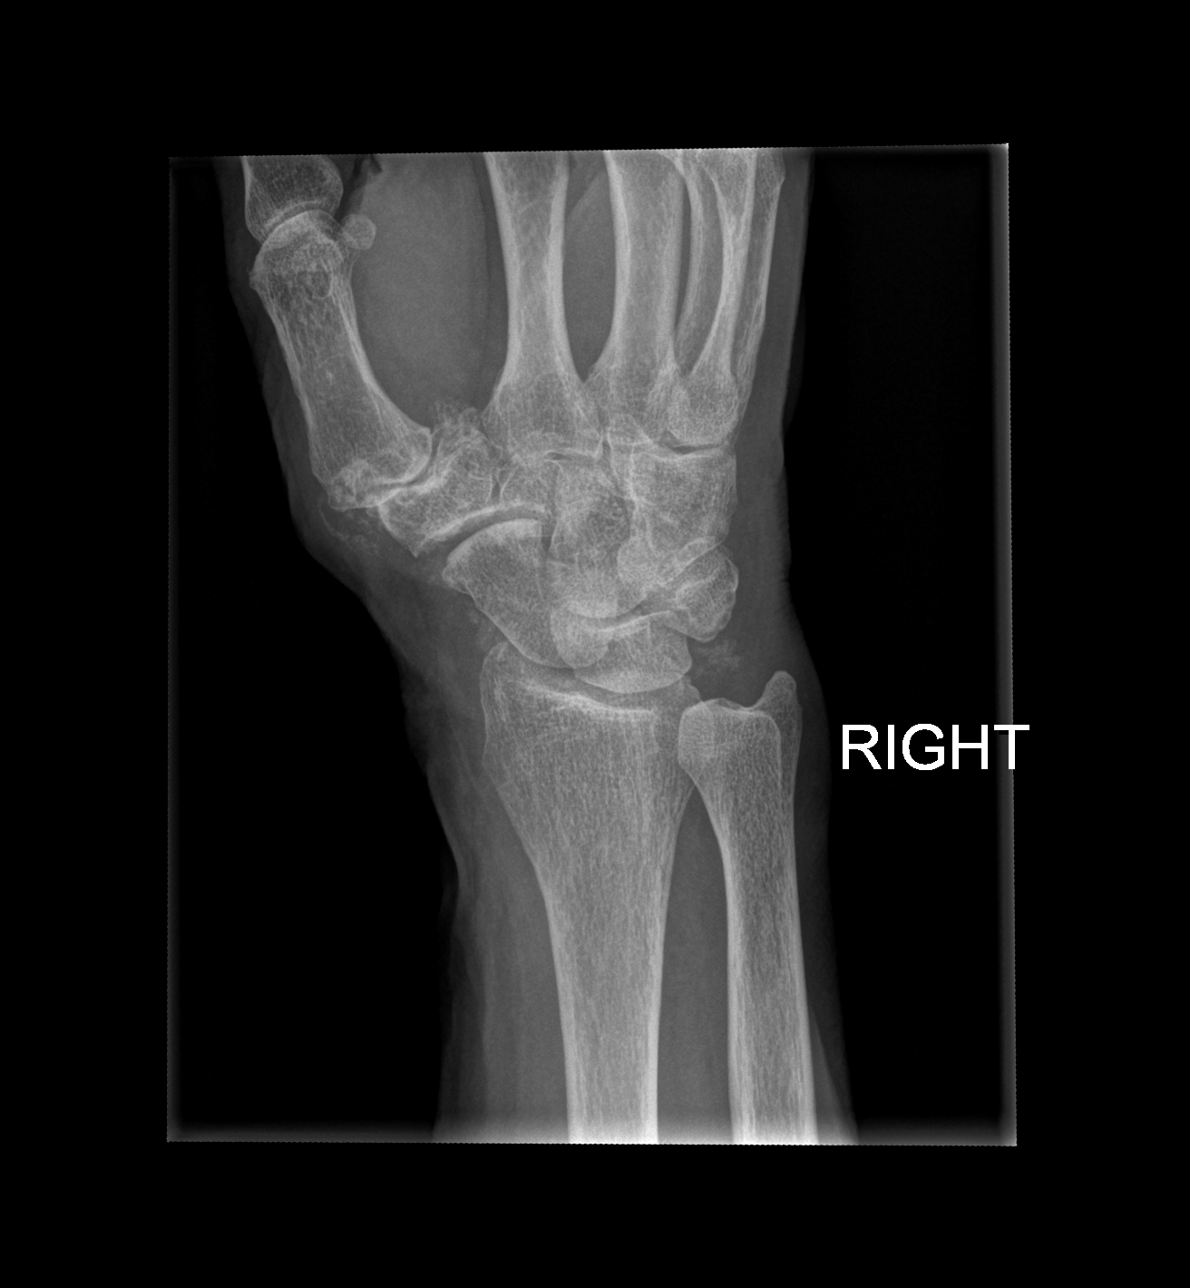

[x wrist lat right]
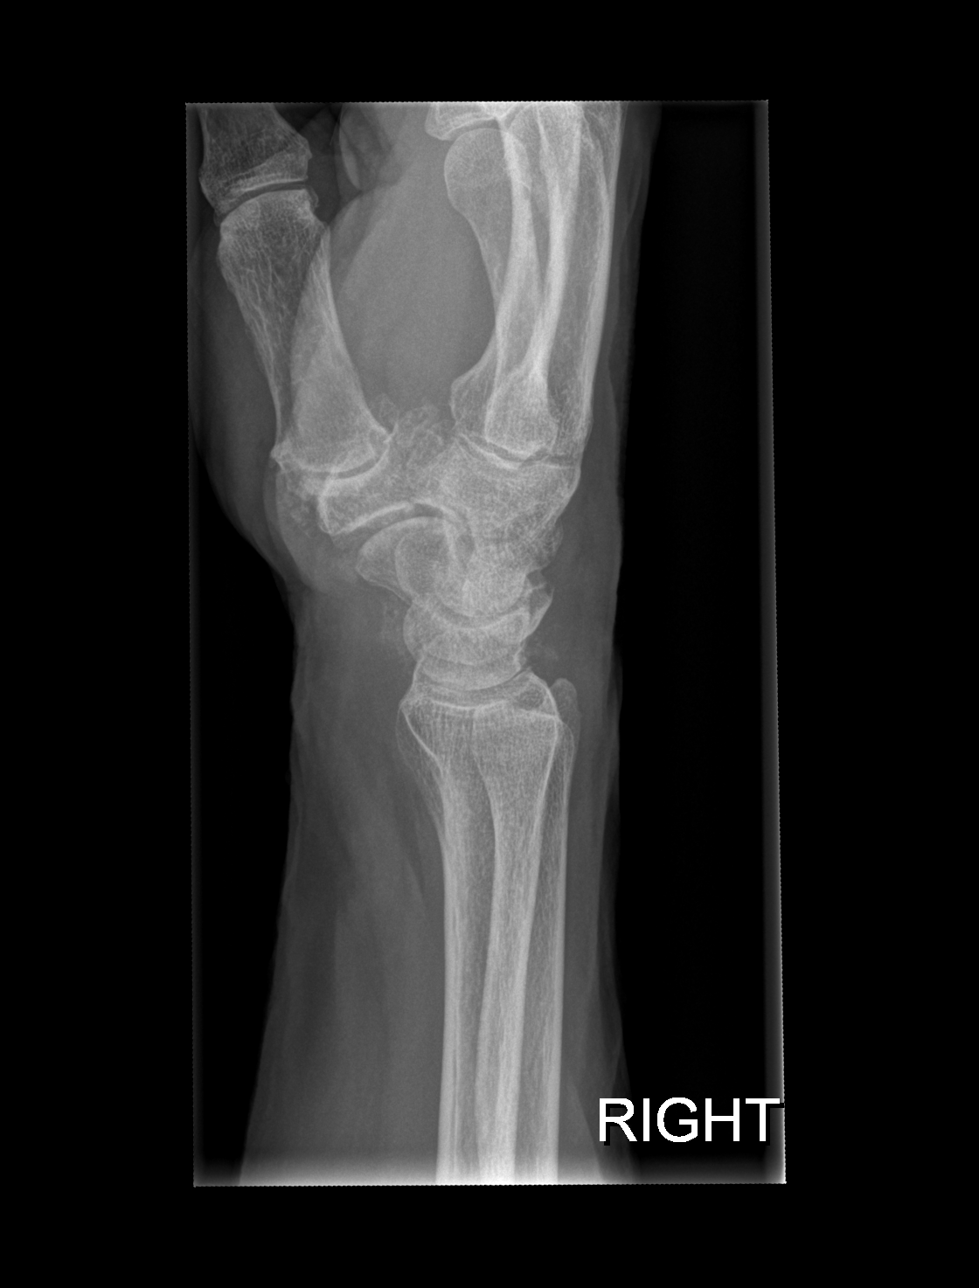

[x wrist navicular view right]
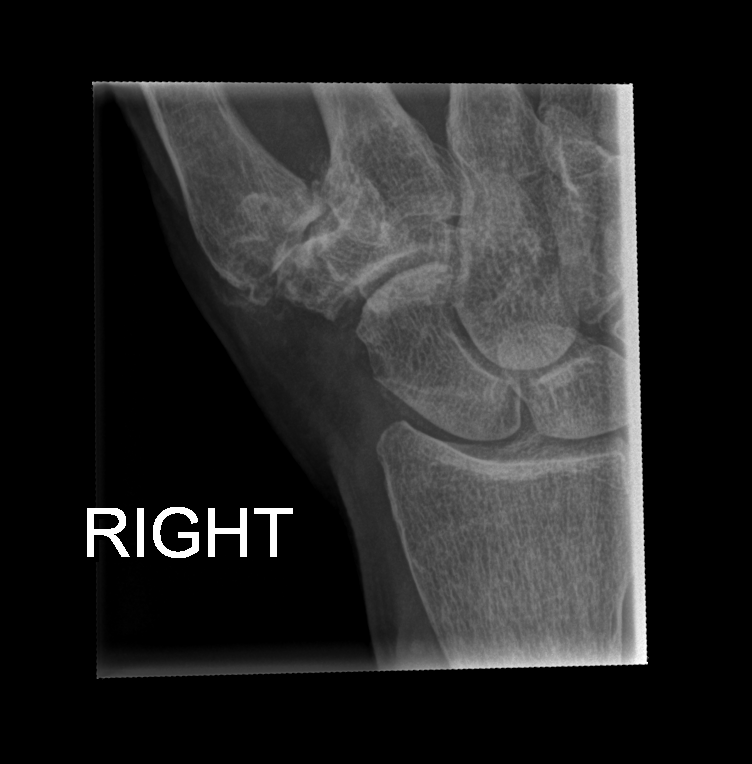

[4 of 4 positions shown; findings below may reference images not displayed]

FINDINGS: No fracture or dislocation is noted.  Sclerosis and
narrowing of first carpometacarpal joint is noted consistent with
osteoarthritis. Other joint spaces appear intact.
IMPRESSION: Degenerative changes as described above.  No acute abnormality seen
in the right wrist.

## 2015-01-20 ENCOUNTER — Other Ambulatory Visit (INDEPENDENT_AMBULATORY_CARE_PROVIDER_SITE_OTHER): Payer: Medicare HMO

## 2015-01-20 DIAGNOSIS — Z8546 Personal history of malignant neoplasm of prostate: Secondary | ICD-10-CM

## 2015-01-20 DIAGNOSIS — E785 Hyperlipidemia, unspecified: Secondary | ICD-10-CM | POA: Diagnosis not present

## 2015-01-20 DIAGNOSIS — Z0000 Encounter for general adult medical examination without abnormal findings: Secondary | ICD-10-CM

## 2015-01-20 LAB — PSA: PSA: 20.02 ng/mL — AB (ref 0.10–4.00)

## 2015-01-20 LAB — LIPID PANEL
CHOLESTEROL: 116 mg/dL (ref 0–200)
HDL: 39.6 mg/dL (ref >39.00)
LDL Cholesterol: 60 mg/dL (ref 0–99)
NonHDL: 76.26
Total CHOL/HDL Ratio: 3
Triglycerides: 83 mg/dL (ref 0.0–149.0)
VLDL: 16.6 mg/dL (ref 0.0–40.0)

## 2015-01-20 LAB — BASIC METABOLIC PANEL
BUN: 19 mg/dL (ref 6–23)
CALCIUM: 9.2 mg/dL (ref 8.4–10.5)
CO2: 29 mEq/L (ref 19–32)
CREATININE: 0.81 mg/dL (ref 0.40–1.50)
Chloride: 104 mEq/L (ref 96–112)
GFR: 96.19 mL/min (ref >60.00)
Glucose, Bld: 81 mg/dL (ref 70–99)
Potassium: 4.4 mEq/L (ref 3.5–5.1)
Sodium: 141 mEq/L (ref 135–145)

## 2015-01-20 LAB — URINALYSIS
Bilirubin Urine: NEGATIVE
KETONES UR: NEGATIVE
Leukocytes, UA: NEGATIVE
Nitrite: NEGATIVE
Specific Gravity, Urine: 1.015 (ref 1.000–1.030)
Total Protein, Urine: NEGATIVE
URINE GLUCOSE: NEGATIVE
UROBILINOGEN UA: 0.2 (ref 0.0–1.0)
pH: 6.5 (ref 5.0–8.0)

## 2015-01-20 LAB — CBC WITH DIFFERENTIAL/PLATELET
BASOS ABS: 0 10*3/uL (ref 0.0–0.1)
Basophils Relative: 0.4 % (ref 0.0–3.0)
Eosinophils Absolute: 0.2 10*3/uL (ref 0.0–0.7)
Eosinophils Relative: 2.3 % (ref 0.0–5.0)
HCT: 46.4 % (ref 39.0–52.0)
Hemoglobin: 15.6 g/dL (ref 13.0–17.0)
LYMPHS ABS: 2.2 10*3/uL (ref 0.7–4.0)
Lymphocytes Relative: 31.7 % (ref 12.0–46.0)
MCHC: 33.5 g/dL (ref 30.0–36.0)
MCV: 91.7 fl (ref 78.0–100.0)
MONO ABS: 0.5 10*3/uL (ref 0.1–1.0)
MONOS PCT: 7.8 % (ref 3.0–12.0)
NEUTROS PCT: 57.8 % (ref 43.0–77.0)
Neutro Abs: 4 10*3/uL (ref 1.4–7.7)
Platelets: 175 10*3/uL (ref 150.0–400.0)
RBC: 5.06 Mil/uL (ref 4.22–5.81)
RDW: 13.6 % (ref 11.5–15.5)
WBC: 7 10*3/uL (ref 4.0–10.5)

## 2015-01-20 LAB — HEPATIC FUNCTION PANEL
ALT: 17 U/L (ref 0–53)
AST: 18 U/L (ref 0–37)
Albumin: 3.9 g/dL (ref 3.5–5.2)
Alkaline Phosphatase: 66 U/L (ref 39–117)
BILIRUBIN DIRECT: 0.2 mg/dL (ref 0.0–0.3)
BILIRUBIN TOTAL: 0.8 mg/dL (ref 0.2–1.2)
Total Protein: 6.5 g/dL (ref 6.0–8.3)

## 2015-01-20 LAB — TSH: TSH: 3.36 u[IU]/mL (ref 0.35–4.50)

## 2015-01-21 ENCOUNTER — Ambulatory Visit (INDEPENDENT_AMBULATORY_CARE_PROVIDER_SITE_OTHER): Payer: Medicare HMO | Admitting: General Practice

## 2015-01-21 DIAGNOSIS — Z5181 Encounter for therapeutic drug level monitoring: Secondary | ICD-10-CM

## 2015-01-21 LAB — POCT INR: INR: 1.7

## 2015-01-21 NOTE — Progress Notes (Signed)
I have reviewed and agree with the plan. 

## 2015-01-21 NOTE — Progress Notes (Signed)
Pre visit review using our clinic review tool, if applicable. No additional management support is needed unless otherwise documented below in the visit note. 

## 2015-01-24 ENCOUNTER — Other Ambulatory Visit: Payer: Self-pay | Admitting: Family Medicine

## 2015-01-24 ENCOUNTER — Other Ambulatory Visit: Payer: Self-pay | Admitting: General Practice

## 2015-01-24 ENCOUNTER — Ambulatory Visit (INDEPENDENT_AMBULATORY_CARE_PROVIDER_SITE_OTHER): Payer: Medicare HMO | Admitting: Family Medicine

## 2015-01-24 ENCOUNTER — Encounter: Payer: Self-pay | Admitting: Family Medicine

## 2015-01-24 VITALS — BP 132/69 | HR 62 | Temp 98.7°F | Ht 69.0 in | Wt 185.0 lb

## 2015-01-24 DIAGNOSIS — Z0000 Encounter for general adult medical examination without abnormal findings: Secondary | ICD-10-CM

## 2015-01-24 MED ORDER — WARFARIN SODIUM 5 MG PO TABS: ORAL_TABLET | ORAL | Status: DC

## 2015-01-24 NOTE — Telephone Encounter (Signed)
Levothyroxine filled on 05/08/14 for one year.  Request denied.

## 2015-01-24 NOTE — Progress Notes (Signed)
   Subjective:    Patient ID: [name]., male    DOB: 02-Apr-1927, 81 y.o.   MRN: [id number]  HPI 81 yr old male for a cpx. He is doing quite well. He sees Urology once a year and he sees Dr. Harrington Challenger for cardiac care regularly.    Review of Systems  Constitutional: Negative.   HENT: Negative.   Eyes: Negative.   Respiratory: Negative.   Cardiovascular: Negative.   Gastrointestinal: Negative.   Genitourinary: Negative.   Musculoskeletal: Negative.   Skin: Negative.   Neurological: Negative.   Psychiatric/Behavioral: Negative.        Objective:   Physical Exam  Constitutional: He is oriented to person, place, and time. He appears well-developed and well-nourished. No distress.  HENT:  Head: Normocephalic and atraumatic.  Right Ear: External ear normal.  Left Ear: External ear normal.  Nose: Nose normal.  Mouth/Throat: Oropharynx is clear and moist. No oropharyngeal exudate.  Eyes: Conjunctivae and EOM are normal. Pupils are equal, round, and reactive to light. Right eye exhibits no discharge. Left eye exhibits no discharge. No scleral icterus.  Neck: Neck supple. No JVD present. No tracheal deviation present. No thyromegaly present.  Cardiovascular: Normal rate, regular rhythm, normal heart sounds and intact distal pulses.  Exam reveals no gallop and no friction rub.   No murmur heard. Pulmonary/Chest: Effort normal and breath sounds normal. No respiratory distress. He has no wheezes. He has no rales. He exhibits no tenderness.  Abdominal: Soft. Bowel sounds are normal. He exhibits no distension and no mass. There is no tenderness. There is no rebound and no guarding.  Genitourinary: Rectum normal, prostate normal and penis normal. Guaiac negative stool. No penile tenderness.  Musculoskeletal: Normal range of motion. He exhibits no edema or tenderness.  Lymphadenopathy:    He has no cervical adenopathy.  Neurological: He is alert and oriented to person, place, and time. He  has normal reflexes. No cranial nerve deficit. He exhibits normal muscle tone. Coordination normal.  Skin: Skin is warm and dry. No rash noted. He is not diaphoretic. No erythema. No pallor.  Psychiatric: He has a normal mood and affect. His behavior is normal. Judgment and thought content normal.          Assessment & Plan:  Well exam. We discussed diet and exercise .

## 2015-02-18 ENCOUNTER — Ambulatory Visit (INDEPENDENT_AMBULATORY_CARE_PROVIDER_SITE_OTHER): Payer: Medicare HMO | Admitting: General Practice

## 2015-02-18 DIAGNOSIS — Z5181 Encounter for therapeutic drug level monitoring: Secondary | ICD-10-CM

## 2015-02-18 LAB — POCT INR: INR: 2.6

## 2015-02-18 NOTE — Progress Notes (Signed)
Pre visit review using our clinic review tool, if applicable. No additional management support is needed unless otherwise documented below in the visit note. 

## 2015-02-27 ENCOUNTER — Other Ambulatory Visit: Payer: Self-pay

## 2015-02-27 MED ORDER — ATORVASTATIN CALCIUM 10 MG PO TABS: ORAL_TABLET | ORAL | Status: DC

## 2015-02-27 MED ORDER — METOPROLOL TARTRATE 25 MG PO TABS: 25.0000 mg | ORAL_TABLET | Freq: Two times a day (BID) | ORAL | Status: DC

## 2015-02-27 MED ORDER — FUROSEMIDE 20 MG PO TABS: 20.0000 mg | ORAL_TABLET | Freq: Every day | ORAL | Status: DC

## 2015-02-27 MED ORDER — LEVOTHYROXINE SODIUM 50 MCG PO TABS: 50.0000 ug | ORAL_TABLET | Freq: Every morning | ORAL | Status: DC

## 2015-03-11 ENCOUNTER — Other Ambulatory Visit: Payer: Self-pay

## 2015-03-12 ENCOUNTER — Telehealth: Payer: Self-pay | Admitting: Family Medicine

## 2015-03-12 NOTE — Telephone Encounter (Signed)
Pt has switched to [facility] on [address] and they use a different brand of  levothyroxine (SYNTHROID, LEVOTHROID) 50 MCG tablet Than [facility]  Pharmacist want to know if ok to switch to the brand they use?

## 2015-03-13 NOTE — Telephone Encounter (Signed)
Notified pharmacy that is is okay to switch brand. Patient states he will call back for a lab appt.

## 2015-03-13 NOTE — Telephone Encounter (Signed)
Yes it is okay to switch, but let's plan on checking a TSH in 90 days

## 2015-03-16 NOTE — Progress Notes (Addendum)
Cardiology Office Note   Date:  03/17/2015   ID:  Luis Buba., DOB 1927/07/05, MRN [id number]  PCP:  [name], MD  Cardiologist:   Dorris Carnes, MD   No chief complaint on file.  F/U of CAD   History of Present Illness: Luis Jovanovic. is a 82 y.o. male with a history ofPAF, HTN, HL,  hypothyroidism, prostate CA. and CAD   IN 2015. LHC demonstrated 3 v CAD and was referred for CABG + MAZE procedure. Intraoperative TEE demonstrated MVP with mod to severe MR. He underwent CABG (L-LAD, S-OM1), MV repair and MAZE with cryothermy ablation clipping of LAA. Post op course c/b AFib and was placed on Amiodarone. Carotid US (02/27/13): 1-39% bilateral.   I saw him in June 2016  Since seen pt deneis CP  Breathing is OK  No palpitations   One episode of dizziness  Unable to get from bed.   Rx for vertigo  None since.       Current Outpatient Prescriptions  Medication Sig Dispense Refill  . amoxicillin (AMOXIL) 500 MG capsule TAKE 4 CAPSULES BY MOUTH 1 HOUR prior TO dental appointment.  1  . aspirin 81 MG tablet Take 81 mg by mouth daily.    [name] atorvastatin (LIPITOR) 10 MG tablet TAKE 1 TABLET BY MOUTH EVERY DAY AT 6 p.m. 90 tablet 0  . Cholecalciferol (VITAMIN D3 PO) Take 2,000 Units by mouth daily.    . fish oil-omega-3 fatty acids 1000 MG capsule Take 1 g by mouth every morning.     . furosemide (LASIX) 20 MG tablet Take 1 tablet (20 mg total) by mouth daily. 90 tablet 0  . levothyroxine (SYNTHROID, LEVOTHROID) 50 MCG tablet Take 1 tablet (50 mcg total) by mouth every morning. 90 tablet 3  . meclizine (ANTIVERT) 25 MG tablet Take 1 tablet (25 mg total) by mouth 3 (three) times daily as needed. 20 tablet 0  . metoprolol tartrate (LOPRESSOR) 25 MG tablet Take 1 tablet (25 mg total) by mouth 2 (two) times daily. 180 tablet 0  . vitamin C (ASCORBIC ACID) 500 MG tablet Take 500 mg by mouth every morning.     . warfarin (COUMADIN) 5 MG tablet Take as directed by  anticoagulation clinic 90 tablet 1   No current facility-administered medications for this visit.    Allergies:   Other   Past Medical History  Diagnosis Date  . Personal history of colonic polyps     tubular adenoma  . Hypertension   . Irritable bowel syndrome   . Prostate cancer [facility]) 1991    sees Dr. Carlota Raspberry at [facility], observation only   . S/P mitral valve repair, maze procedure, and CABG x2 02/28/2013    Complex valvuloplasty including triangular resection of posterior leaflet, 30 mm Sorin Memo 3D ring annuloplasty  . S/P CABG x 2 02/28/2013    LIMA to LAD, SVG to OM1, EVH via right thigh  . S/P Maze operation for atrial fibrillation 02/28/2013    Complete bilateral atrial lesion set using bipolar radiofrequency and cryothermy ablation with clipping of LA appendage  . Pleural effusion, bilateral 03/12/2013    Small to moderate, L>R  . Complication of anesthesia     "serious cognisance problems since my OR in January" (03/13/2013  . Coronary artery disease   . PAF (paroxysmal atrial fibrillation) ([place]) 05/03/2014  . Hx of echocardiogram     Echo 5/16:  Mild focal basal septal hypertrophy, EF 55%, mild  AI, MV repair ok with trivial MR (mean 3 mmHg), mild LAE, mild RAE, PASP 35 mmHg    Past Surgical History  Procedure Laterality Date  . Hemorrhoid surgery    . Prostate biopsy    . Colonoscopy  2011    per Dr. Sharlett Iles, benign polyps, no repeats needed   . Cataract extraction, bilateral Bilateral 2014    per Dr. Gershon Crane   . Coronary artery bypass graft N/A 02/28/2013    Procedure: CORONARY ARTERY BYPASS GRAFTING (CABG);  Surgeon: Rexene Alberts, MD;  Location: [facility];  Service: Open Heart Surgery;  Laterality: N/A;  CABG x 2,  using left internal mammary artery and right leg saphenous vein harvested endoscopically  . Intraoperative transesophageal echocardiogram N/A 02/28/2013    Procedure: INTRAOPERATIVE TRANSESOPHAGEAL ECHOCARDIOGRAM;  Surgeon: Rexene Alberts, MD;  Location: [facility];   Service: Open Heart Surgery;  Laterality: N/A;  . Maze N/A 02/28/2013    Procedure: MAZE;  Surgeon: Rexene Alberts, MD;  Location: [facility];  Service: Open Heart Surgery;  Laterality: N/A;  . Mitral valve repair N/A 02/28/2013    Procedure: MITRAL VALVE REPAIR (MVR);  Surgeon: Rexene Alberts, MD;  Location: [facility] [place];  Service: Open Heart Surgery;  Laterality: N/A;  . Cardiac catheterization    . Left heart catheterization with coronary angiogram N/A 02/26/2013    Procedure: LEFT HEART CATHETERIZATION WITH CORONARY ANGIOGRAM;  Surgeon: Leonie Man, MD;  Location: [facility] C[facility] LAB;  Service: Cardiovascular;  Laterality: N/A;     Social History:  The patient  reports that he has never smoked. He has never used smokeless tobacco. He reports that he does not drink alcohol or use illicit drugs.   Family History:  The patient's family history includes Cancer in his brother and daughter; Diabetes in his sister; Heart disease in his son; Hypertension in his father and mother; Lung cancer in his brother; Stomach cancer in his brother; Sudden death in his father and mother.    ROS:  Please see the history of present illness. All other systems are reviewed and  Negative to the above problem except as noted.    PHYSICAL EXAM: VS:  BP 128/60 mmHg  Pulse 64  Ht 5\' 9"  (1.753 m)  Wt 82.918 kg (182 lb 12.8 oz)  BMI 26.98 kg/m2  SpO2 96%  GEN: Well nourished, well developed, in no acute distress HEENT: normal Neck: no JVD, carotid bruits, or masses Cardiac: RRR; no murmurs, rubs, or gallops,no edema  Respiratory:  clear to auscultation bilaterally, normal work of breathing GI: soft, nontender, nondistended, + BS  No hepatomegaly  MS: no deformity Moving all extremities   Skin: warm and dry, no rash Neuro:  Strength and sensation are intact Psych: euthymic mood, full affect   EKG:  EKG is  ordered today.  SB 59 bpm  First degree AV block  PR 216 msec   Lipid Panel    Component Value Date/Time   CHOL 116  01/20/2015 0738   TRIG 83.0 01/20/2015 0738   HDL 39.60 01/20/2015 0738   CHOLHDL 3 01/20/2015 0738   VLDL 16.6 01/20/2015 0738   LDLCALC 60 01/20/2015 0738   LDLDIRECT 159.3 05/17/2007 0845      Wt Readings from Last 3 Encounters:  03/17/15 82.918 kg (182 lb 12.8 oz)  01/24/15 83.915 kg (185 lb)  08/15/14 82.283 kg (181 lb 6.4 oz)      ASSESSMENT AND PLAN:  1  CAD No symptoms to sugg  angina  Follow   2  MV  S/p repair  Doing good  Exam good    3.  Afib  S/p MAZE and LAA clipping  CLinically in SR  Pt wants to stop coumadin.  I agree given surgical actions and that she remains in [place] is still a sl risk risk clot but risk benefit appears to favor stopping to avoid bleeding complications    Keep on ASA  4  HL  Good on statin    5  Dizziness One episode in 6 month that sounds like vertigo  None since   6.  Shoulder  Pain  Pt with L shoulder pain  Refer to [facility] in sports medciine    Disposition:   FU with  in Fall    Signed, Dorris Carnes, MD  03/17/2015 11:18 AM    [address], [place], [place]  [address] Phone: [phone]; Fax: [phone]

## 2015-03-17 ENCOUNTER — Ambulatory Visit (INDEPENDENT_AMBULATORY_CARE_PROVIDER_SITE_OTHER): Payer: Medicare HMO | Admitting: Internal Medicine

## 2015-03-17 ENCOUNTER — Encounter: Payer: Self-pay | Admitting: Internal Medicine

## 2015-03-17 ENCOUNTER — Telehealth: Payer: Self-pay | Admitting: Family Medicine

## 2015-03-17 VITALS — BP 128/60 | HR 64 | Ht 69.0 in | Wt 182.8 lb

## 2015-03-17 DIAGNOSIS — M25512 Pain in left shoulder: Secondary | ICD-10-CM | POA: Diagnosis not present

## 2015-03-17 DIAGNOSIS — I5032 Chronic diastolic (congestive) heart failure: Secondary | ICD-10-CM | POA: Diagnosis not present

## 2015-03-17 MED ORDER — ASPIRIN EC 325 MG PO TBEC: 325.0000 mg | DELAYED_RELEASE_TABLET | Freq: Every day | ORAL | Status: DC

## 2015-03-17 MED ORDER — NITROGLYCERIN 0.4 MG SL SUBL: 0.4000 mg | SUBLINGUAL_TABLET | SUBLINGUAL | Status: DC | PRN

## 2015-03-17 NOTE — Patient Instructions (Signed)
Your physician has recommended you make the following change in your medication:  1.) start aspirin 325 mg once daily 2.) stop warfarin and baby aspirin  You have been referred to Dr. Drusilla Kanner, Family Practice/Sports Medicine for your left shoulder pain.  Your physician wants you to follow-up in: Aug, 2017 with Dr. Harrington Challenger.  You will receive a reminder letter in the mail two months in advance. If you don't receive a letter, please call our office to schedule the follow-up appointment.

## 2015-03-17 NOTE — Telephone Encounter (Signed)
Pt request to update pharmacy, now using [facility]. I updated this in pt's chart.

## 2015-03-19 ENCOUNTER — Ambulatory Visit: Payer: Medicare HMO

## 2015-03-31 ENCOUNTER — Encounter: Payer: Self-pay | Admitting: Thoracic Surgery (Cardiothoracic Vascular Surgery)

## 2015-03-31 ENCOUNTER — Ambulatory Visit (INDEPENDENT_AMBULATORY_CARE_PROVIDER_SITE_OTHER): Payer: Medicare HMO | Admitting: Thoracic Surgery (Cardiothoracic Vascular Surgery)

## 2015-03-31 VITALS — BP 116/64 | HR 61 | Resp 16 | Ht 69.0 in | Wt 182.0 lb

## 2015-03-31 DIAGNOSIS — Z9889 Other specified postprocedural states: Secondary | ICD-10-CM | POA: Diagnosis not present

## 2015-03-31 NOTE — Progress Notes (Addendum)
[address]       [place] [address]             [phone]     CARDIOTHORACIC SURGERY OFFICE NOTE  Referring Provider is Fay Records, MD PCP is [name], MD   HPI:  Patient is an 82 year old gentleman who returns for routine follow-up and rhythm check approximately 2 years status post mitral valve repair, coronary artery bypass grafting 2, and maze procedure on 02/28/2013.  He was last seen here in our office on 03/25/2014. Since then he has been seen in follow-up by Dr. Harrington Challenger on several occasions. Routine follow-up echocardiogram performed 07/02/2014 revealed intact mitral valve repair with trivial residual mitral regurgitation and normal left ventricular systolic function with ejection fraction estimated 55%.  The patient has remained in sinus rhythm and was recently taken off of warfarin anticoagulation by Dr. Harrington Challenger. He returns to our office for routine follow-up today. He states that he feels remarkably well and noticeably considerably improved in comparison with how he felt prior to his surgery more than 2 years ago. He remains remarkably active for a gentleman his age. He states that he only gets short of breath with strenuous physical activity. He denies any symptoms of exertional shortness of breath or chest discomfort with ordinary activities. He had a brief episode of dizziness without syncope in March 2016. Otherwise he has not had palpitations, dizzy spells, or syncope.  Overall he reports feeling remarkably well.   Current Outpatient Prescriptions  Medication Sig Dispense Refill  . amoxicillin (AMOXIL) 500 MG capsule TAKE 4 CAPSULES BY MOUTH 1 HOUR prior TO dental appointment.  1  . aspirin EC 325 MG tablet Take 1 tablet (325 mg total) by mouth daily. 30 tablet 0  . atorvastatin (LIPITOR) 10 MG tablet TAKE 1 TABLET BY MOUTH EVERY DAY AT 6 p.m. 90 tablet 0  . Cholecalciferol (VITAMIN D3 PO) Take 2,000 Units by mouth daily.    . fish oil-omega-3  fatty acids 1000 MG capsule Take 1 g by mouth every morning.     . furosemide (LASIX) 20 MG tablet Take 1 tablet (20 mg total) by mouth daily. 90 tablet 0  . levothyroxine (SYNTHROID, LEVOTHROID) 50 MCG tablet Take 1 tablet (50 mcg total) by mouth every morning. 90 tablet 3  . meclizine (ANTIVERT) 25 MG tablet Take 1 tablet (25 mg total) by mouth 3 (three) times daily as needed. 20 tablet 0  . metoprolol tartrate (LOPRESSOR) 25 MG tablet Take 1 tablet (25 mg total) by mouth 2 (two) times daily. 180 tablet 0  . nitroGLYCERIN (NITROSTAT) 0.4 MG SL tablet Place 1 tablet (0.4 mg total) under the tongue every 5 (five) minutes as needed for chest pain. 25 tablet 3  . vitamin C (ASCORBIC ACID) 500 MG tablet Take 500 mg by mouth every morning.      No current facility-administered medications for this visit.      Physical Exam:   BP 116/64 mmHg  Pulse 61  Resp 16  Ht 5\' 9"  (1.753 m)  Wt 182 lb (82.555 kg)  BMI 26.86 kg/m2  SpO2 98%  General:  Well-appearing  Chest:   Clear to auscultation  CV:   Regular rate and rhythm without murmur  Incisions:  Completely healed, sternum is stable  Abdomen:  Soft and nontender  Extremities:  Warm and well-perfused  Diagnostic Tests:  2 channel telemetry rhythm strips demonstrates normal sinus rhythm   Echocardiography  Patient:  Yordano, Delbianco MR #:    [id number] Study Date: 07/02/2014 Gender:   M Age:    71 Height:   175.3 cm Weight:   82.6 kg BSA:    2.02 m^2 Pt. Status: Room:  SONOGRAPHER Victorio Palm, RDCS ATTENDING  Richardson Dopp T Faylene Million, Scott T REFERRING  Richardson Dopp T PERFORMING  [facility], Outpatient  cc:  ------------------------------------------------------------------- LV EF: 55%  ------------------------------------------------------------------- Indications:   Mitral valve disease (I05.9). Atrial fibrillation  (I48.91).  ------------------------------------------------------------------- History:  PMH: Acquired from the patient and from the patient&'s chart. Atrial fibrillation. Coronary artery disease. Mild mitral stenosis. Risk factors: Hypertension. Dyslipidemia.  ------------------------------------------------------------------- Study Conclusions  - Left ventricle: The cavity size was normal. There was mild focal basal hypertrophy of the septum. The estimated ejection fraction was 55%. - Aortic valve: There was mild regurgitation. - Mitral valve: S/P repair with annuloplasty ring and trivial resideual MR. Valve area by pressure half-time: 1.68 cm^2. Valve area by continuity equation (using LVOT flow): 2.22 cm^2. - Left atrium: The atrium was mildly dilated. - Right atrium: The atrium was mildly dilated. - Atrial septum: No defect or patent foramen ovale was identified. - Pulmonary arteries: PA peak pressure: 35 mm Hg (S).  ------------------------------------------------------------------- Labs, prior tests, procedures, and surgery: Echocardiography (January 2015).  The mitral valve showed mild stenosis. EF was 60% and PA pressure was 33 (systolic).  Catheterization.  The study demonstrated coronary artery disease. Coronary artery bypass grafting.   Mitral valve repair. Maze procedure. Cryothermy ablation clipping of the LAA. Echocardiography. M-mode, complete 2D, spectral Doppler, and color Doppler. Birthdate: Patient birthdate: Sep 10, 1927. Age: Patient is 82 yr old. Sex: Gender: male.  BMI: 26.9 kg/m^2. Blood pressure:   110/70 Patient status: Outpatient. Study date: Study date: 07/02/2014. Study time: 02:04 PM. Location: [facility]  -------------------------------------------------------------------  ------------------------------------------------------------------- Left ventricle: The cavity size was normal. There was mild  focal basal hypertrophy of the septum. The estimated ejection fraction was 55%.  ------------------------------------------------------------------- Aortic valve:  Mildly calcified leaflets. Doppler: There was mild regurgitation.  ------------------------------------------------------------------- Aorta: Ascending aorta: The ascending aorta was mildly dilated.  ------------------------------------------------------------------- Mitral valve: S/P repair with annuloplasty ring and trivial resideual MR. Doppler:   Valve area by pressure half-time: 1.68 cm^2. Indexed valve area by pressure half-time: 0.83 cm^2/m^2. Valve area by continuity equation (using LVOT flow): 2.22 cm^2. Indexed valve area by continuity equation (using LVOT flow): 1.1 cm^2/m^2.  Mean gradient (D): 3 mm Hg. Peak gradient (D): 8 mm Hg.  ------------------------------------------------------------------- Left atrium: The atrium was mildly dilated.  ------------------------------------------------------------------- Atrial septum: No defect or patent foramen ovale was identified.  ------------------------------------------------------------------- Right ventricle: The cavity size was normal. Wall thickness was normal. Systolic function was normal.  ------------------------------------------------------------------- Pulmonic valve:  Doppler: There was trivial regurgitation.  ------------------------------------------------------------------- Tricuspid valve:  Doppler: There was mild regurgitation.  ------------------------------------------------------------------- Right atrium: The atrium was mildly dilated.  ------------------------------------------------------------------- Pericardium: The pericardium was normal in appearance.  ------------------------------------------------------------------- Measurements  Left ventricle              Value     Reference LV  ID, ED, PLAX chordal          43.9 mm    43 - 52 LV ID, ES, PLAX chordal          26.9 mm    23 - 38 LV fx shortening, PLAX chordal      39  %    >=29 LV PW thickness, ED  13.4 mm    --------- IVS/LV PW ratio, ED            0.87      <=1.3 Stroke volume, 2D             134  ml    --------- Stroke volume/bsa, 2D           66  ml/m^2  ---------  Ventricular septum            Value     Reference IVS thickness, ED             11.6 mm    ---------  LVOT                   Value     Reference LVOT ID, S                28  mm    --------- LVOT area                 6.16 cm^2   --------- LVOT ID                  28  mm    --------- LVOT peak velocity, S           85.7 cm/s   --------- LVOT mean velocity, S           54.7 cm/s   --------- LVOT VTI, S                21.8 cm    --------- LVOT peak gradient, S           3   mm Hg  --------- Stroke volume (SV), LVOT DP        134.2 ml    --------- Stroke index (SV/bsa), LVOT DP      66.5 ml/m^2  ---------  Aortic valve               Value     Reference Aortic regurg pressure half-time     565  ms    ---------  Aorta                   Value     Reference Aortic root ID, ED            41  mm    --------- Ascending aorta ID, A-P, S        40  mm    ---------  Left atrium                Value     Reference LA ID, A-P, ES              46  mm    --------- LA ID/bsa, A-P          (H)   2.28 cm/m^2  <=2.2 LA volume, S               98  ml    --------- LA volume/bsa, S              48.5 ml/m^2  --------- LA volume, ES, 1-p A4C          78  ml    --------- LA volume/bsa, ES, 1-p A4C        38.6 ml/m^2  --------- LA volume, ES, 1-p A2C          118  ml    --------- LA volume/bsa,  ES, 1-p A2C        58.4 ml/m^2  ---------  Mitral valve               Value     Reference Mitral E-wave peak velocity        141  cm/s   --------- Mitral A-wave peak velocity        54.8 cm/s   --------- Mitral mean velocity, D          82.3 cm/s   --------- Mitral deceleration time     (H)   401  ms    150 - 230 Mitral pressure half-time         140  ms    --------- Mitral mean gradient, D          3   mm Hg  --------- Mitral peak gradient, D          8   mm Hg  --------- Mitral E/A ratio, peak          2.1      --------- Mitral valve area, PHT, DP        1.68 cm^2   --------- Mitral valve area/bsa, PHT, DP      0.83 cm^2/m^2 --------- Mitral valve area, LVOT          2.22 cm^2   --------- continuity Mitral valve area/bsa, LVOT        1.1  cm^2/m^2 --------- continuity Mitral annulus VTI, D           60.4 cm    ---------  Pulmonary arteries            Value     Reference PA pressure, S, DP        (H)   35  mm Hg  <=30  Tricuspid valve              Value     Reference Tricuspid regurg peak velocity      284  cm/s   --------- Tricuspid peak RV-RA gradient       32  mm Hg  ---------  Systemic veins              Value     Reference Estimated CVP               3   mm Hg  ---------  Right ventricle              Value     Reference RV pressure, S, DP        (H)   35  mm Hg   <=30  Legend: (L) and (H) mark values outside specified reference range.  ------------------------------------------------------------------- Prepared and Electronically Authenticated by  Jenkins Rouge, M.D. 2016-05-10T15:46:38   Impression:  Patient is doing remarkably well and maintaining sinus rhythm approximately 2 years status post mitral valve repair, coronary artery bypass grafting, and Maze procedure.  Plan:  In the future the patient will call and return to see [place] as needed. He will continue to follow-up intermittently with Dr. Harrington Challenger.  The patient has been reminded regarding the importance of dental hygiene and the lifelong need for antibiotic prophylaxis for all dental cleanings and other related invasive procedures.  I spent in excess of 15 minutes during the conduct of this office consultation and >50% of this time involved direct face-to-face encounter with the patient for counseling and/or coordination of their care.    Valentina Gu. Roxy Manns, MD 03/31/2015 12:08 PM

## 2015-03-31 NOTE — Patient Instructions (Signed)

## 2015-04-29 NOTE — Telephone Encounter (Signed)
Left message for pt to call back.  Needs tsh around April 18

## 2015-04-29 NOTE — Telephone Encounter (Signed)
Pt will call back after he starts the new brand name. Pt had not started, and will call back when refill is almost gone to get tsh labs.

## 2015-05-27 ENCOUNTER — Ambulatory Visit: Payer: Self-pay | Admitting: General Practice

## 2015-06-24 ENCOUNTER — Telehealth: Payer: Self-pay | Admitting: Family Medicine

## 2015-06-24 DIAGNOSIS — R69 Illness, unspecified: Secondary | ICD-10-CM | POA: Diagnosis not present

## 2015-06-24 NOTE — Telephone Encounter (Signed)
Pt left a voice message, it's time to have PSA lab draw, requesting [facility] location.

## 2015-06-25 ENCOUNTER — Other Ambulatory Visit: Payer: Self-pay | Admitting: Family Medicine

## 2015-06-25 DIAGNOSIS — R972 Elevated prostate specific antigen [PSA]: Secondary | ICD-10-CM

## 2015-06-25 NOTE — Telephone Encounter (Signed)
Per Dr. Sarajane Jews okay to order. I did put in a future lab order for [place] location and spoke with pt.

## 2015-06-27 ENCOUNTER — Other Ambulatory Visit (INDEPENDENT_AMBULATORY_CARE_PROVIDER_SITE_OTHER): Payer: Medicare HMO

## 2015-06-27 DIAGNOSIS — R972 Elevated prostate specific antigen [PSA]: Secondary | ICD-10-CM

## 2015-06-27 LAB — PSA: PSA: 17.86 ng/mL — ABNORMAL HIGH (ref 0.10–4.00)

## 2015-07-03 DIAGNOSIS — R972 Elevated prostate specific antigen [PSA]: Secondary | ICD-10-CM | POA: Diagnosis not present

## 2015-07-03 DIAGNOSIS — Z0000 Encounter for general adult medical examination without abnormal findings: Secondary | ICD-10-CM | POA: Diagnosis not present

## 2015-07-03 DIAGNOSIS — N401 Enlarged prostate with lower urinary tract symptoms: Secondary | ICD-10-CM | POA: Diagnosis not present

## 2015-07-03 DIAGNOSIS — N138 Other obstructive and reflux uropathy: Secondary | ICD-10-CM | POA: Diagnosis not present

## 2015-07-03 DIAGNOSIS — C61 Malignant neoplasm of prostate: Secondary | ICD-10-CM | POA: Diagnosis not present

## 2015-07-03 DIAGNOSIS — R3915 Urgency of urination: Secondary | ICD-10-CM | POA: Diagnosis not present

## 2015-07-17 ENCOUNTER — Other Ambulatory Visit: Payer: Self-pay | Admitting: Family Medicine

## 2015-08-04 ENCOUNTER — Emergency Department (HOSPITAL_COMMUNITY)
Admission: EM | Admit: 2015-08-04 | Discharge: 2015-08-04 | Disposition: A | Discharge status: Home / Self Care | Payer: Medicare HMO | Attending: Emergency Medicine | Admitting: Emergency Medicine

## 2015-08-04 ENCOUNTER — Encounter (HOSPITAL_COMMUNITY): Payer: Self-pay | Admitting: Emergency Medicine

## 2015-08-04 DIAGNOSIS — I2510 Atherosclerotic heart disease of native coronary artery without angina pectoris: Secondary | ICD-10-CM | POA: Insufficient documentation

## 2015-08-04 DIAGNOSIS — Z951 Presence of aortocoronary bypass graft: Secondary | ICD-10-CM | POA: Diagnosis not present

## 2015-08-04 DIAGNOSIS — M5441 Lumbago with sciatica, right side: Secondary | ICD-10-CM | POA: Diagnosis not present

## 2015-08-04 DIAGNOSIS — Z79899 Other long term (current) drug therapy: Secondary | ICD-10-CM | POA: Diagnosis not present

## 2015-08-04 DIAGNOSIS — M545 Low back pain: Secondary | ICD-10-CM | POA: Diagnosis present

## 2015-08-04 DIAGNOSIS — I10 Essential (primary) hypertension: Secondary | ICD-10-CM | POA: Diagnosis not present

## 2015-08-04 DIAGNOSIS — I480 Paroxysmal atrial fibrillation: Secondary | ICD-10-CM | POA: Diagnosis not present

## 2015-08-04 DIAGNOSIS — Z7982 Long term (current) use of aspirin: Secondary | ICD-10-CM | POA: Diagnosis not present

## 2015-08-04 DIAGNOSIS — Z8546 Personal history of malignant neoplasm of prostate: Secondary | ICD-10-CM | POA: Diagnosis not present

## 2015-08-04 MED ORDER — ACETAMINOPHEN 325 MG PO TABS
650.0000 mg | ORAL_TABLET | Freq: Once | ORAL | Status: DC
  Filled 2015-08-04: qty 2

## 2015-08-04 NOTE — ED Notes (Addendum)
Patient states he has had bilateral back lower pain since yesterday morning. Denies bowel or bladder problems. Reports difficulty turning from side to side. Patient ambulates with cane. Denies fall/injury/trauma.

## 2015-08-04 NOTE — ED Provider Notes (Signed)
CSN: [id number]     Arrival date & time 08/04/15  1253 History   First MD Initiated Contact with Patient 08/04/15 1834     Chief Complaint  Patient presents with  . Back Pain   (Consider location/radiation/quality/duration/timing/severity/associated sxs/prior Treatment) HPI 82 y.o. male with a hx of HTN, CAD, presents to the Emergency Department today complaining of right lower back pain with onset around 1430 yesterday. Noted pain when getting out of bed. States pain feels like aching sensation and is 9/10. Pt is able to ambulate with his cane. No hx back surgeries. No hx of the same. No urinary symptoms. No loss of bowel or bladder function. No fevers. No saddle anesthesia. Presents today due to continued pain. No CP/SOB/ABD pain. No numbness/tingling. No other symptoms.   Past Medical History  Diagnosis Date  . Personal history of colonic polyps     tubular adenoma  . Hypertension   . Irritable bowel syndrome   . Prostate cancer [facility]) 1991    sees Dr. Carlota Raspberry at [facility], observation only   . S/P mitral valve repair, maze procedure, and CABG x2 02/28/2013    Complex valvuloplasty including triangular resection of posterior leaflet, 30 mm Sorin Memo 3D ring annuloplasty  . S/P CABG x 2 02/28/2013    LIMA to LAD, SVG to OM1, EVH via right thigh  . S/P Maze operation for atrial fibrillation 02/28/2013    Complete bilateral atrial lesion set using bipolar radiofrequency and cryothermy ablation with clipping of LA appendage  . Pleural effusion, bilateral 03/12/2013    Small to moderate, L>R  . Complication of anesthesia     "serious cognisance problems since my OR in January" (03/13/2013  . Coronary artery disease   . PAF (paroxysmal atrial fibrillation) ([place]) 05/03/2014  . Hx of echocardiogram     Echo 5/16:  Mild focal basal septal hypertrophy, EF 55%, mild AI, MV repair ok with trivial MR (mean 3 mmHg), mild LAE, mild RAE, PASP 35 mmHg   Past Surgical History  Procedure Laterality Date  .  Hemorrhoid surgery    . Prostate biopsy    . Colonoscopy  2011    per Dr. Sharlett Iles, benign polyps, no repeats needed   . Cataract extraction, bilateral Bilateral 2014    per Dr. Gershon Crane   . Coronary artery bypass graft N/A 02/28/2013    Procedure: CORONARY ARTERY BYPASS GRAFTING (CABG);  Surgeon: Rexene Alberts, MD;  Location: [place];  Service: Open Heart Surgery;  Laterality: N/A;  CABG x 2,  using left internal mammary artery and right leg saphenous vein harvested endoscopically  . Intraoperative transesophageal echocardiogram N/A 02/28/2013    Procedure: INTRAOPERATIVE TRANSESOPHAGEAL ECHOCARDIOGRAM;  Surgeon: Rexene Alberts, MD;  Location: [place];  Service: Open Heart Surgery;  Laterality: N/A;  . Maze N/A 02/28/2013    Procedure: MAZE;  Surgeon: Rexene Alberts, MD;  Location: [place];  Service: Open Heart Surgery;  Laterality: N/A;  . Mitral valve repair N/A 02/28/2013    Procedure: MITRAL VALVE REPAIR (MVR);  Surgeon: Rexene Alberts, MD;  Location: [place];  Service: Open Heart Surgery;  Laterality: N/A;  . Cardiac catheterization    . Left heart catheterization with coronary angiogram N/A 02/26/2013    Procedure: LEFT HEART CATHETERIZATION WITH CORONARY ANGIOGRAM;  Surgeon: Leonie Man, MD;  Location: [facility] CATH LAB;  Service: Cardiovascular;  Laterality: N/A;   Family History  Problem Relation Age of Onset  . Diabetes    . Hypertension    .  Lung cancer Brother   . Sudden death    . Heart disease    . Stroke    . Heart disease Son   . Stomach cancer Brother   . Hypertension Mother   . Hypertension Father   . Sudden death Mother   . Sudden death Father   . Cancer Daughter   . Cancer Brother   . Diabetes Sister    Social History  Substance Use Topics  . Smoking status: Never Smoker   . Smokeless tobacco: Never Used  . Alcohol Use: No    Review of Systems ROS reviewed and all are negative for acute change except as noted in the HPI.  Allergies  Other  Home Medications    Prior to Admission medications   Medication Sig Start Date End Date Taking? Authorizing Provider  amoxicillin (AMOXIL) 500 MG capsule TAKE 4 CAPSULES BY MOUTH 1 HOUR prior TO dental appointment. 07/30/14   Historical Provider, MD  aspirin EC 325 MG tablet Take 1 tablet (325 mg total) by mouth daily. 03/17/15   Fay Records, MD  atorvastatin (LIPITOR) 10 MG tablet TAKE 1 TABLET BY MOUTH EVERY DAY AT 6PM 07/17/15   [name], MD  Cholecalciferol (VITAMIN D3 PO) Take 2,000 Units by mouth daily.    Historical Provider, MD  fish oil-omega-3 fatty acids 1000 MG capsule Take 1 g by mouth every morning.     Historical Provider, MD  furosemide (LASIX) 20 MG tablet Take 1 tablet (20 mg total) by mouth daily. 02/27/15   [name], MD  levothyroxine (SYNTHROID, LEVOTHROID) 50 MCG tablet Take 1 tablet (50 mcg total) by mouth every morning. 02/27/15   [name], MD  meclizine (ANTIVERT) 25 MG tablet Take 1 tablet (25 mg total) by mouth 3 (three) times daily as needed. 05/03/14   Glendell Docker, NP  metoprolol tartrate (LOPRESSOR) 25 MG tablet TAKE 1 TABLET BY MOUTH TWICE DAILY 07/17/15   [name], MD  nitroGLYCERIN (NITROSTAT) 0.4 MG SL tablet Place 1 tablet (0.4 mg total) under the tongue every 5 (five) minutes as needed for chest pain. 03/17/15   Fay Records, MD  vitamin C (ASCORBIC ACID) 500 MG tablet Take 500 mg by mouth every morning.     Historical Provider, MD   BP 146/65 mmHg  Pulse 71  Temp(Src) 98.3 F (36.8 C) (Oral)  Resp 18  Ht 5\' 9"  (1.753 m)  Wt 83.008 kg  BMI 27.01 kg/m2  SpO2 100%   Physical Exam  Constitutional: He is oriented to person, place, and time. He appears well-developed and well-nourished.  HENT:  Head: Normocephalic and atraumatic.  Eyes: EOM are normal. Pupils are equal, round, and reactive to light.  Neck: Normal range of motion. Neck supple. No tracheal deviation present.  Cardiovascular: Normal rate, regular rhythm, normal heart sounds and intact distal  pulses.   Pulmonary/Chest: Effort normal and breath sounds normal.  Abdominal: Soft. Bowel sounds are normal. There is no tenderness.  Musculoskeletal: Normal range of motion.       Lumbar back: Normal. He exhibits normal range of motion, no tenderness, no bony tenderness, no swelling, no edema, no deformity, no laceration, no pain, no spasm and normal pulse.  TTP Right Low Back. No Spinous process tenderness. Able to ambulate. ROM causes discomfort of right low back.   Neurological: He is alert and oriented to person, place, and time.  Skin: Skin is warm and dry.  Psychiatric: He has a  normal mood and affect. His behavior is normal. Thought content normal.  Nursing note and vitals reviewed.  ED Course  Procedures (including critical care time) Labs Review Labs Reviewed - No data to display  Imaging Review No results found. I have personally reviewed and evaluated these images and lab results as part of my medical decision-making.   EKG Interpretation None      MDM  I have reviewed the relevant previous healthcare records. I obtained HPI from historian. Patient discussed with supervising physician  ED Course:  Assessment: Patient is a 52 with a hx of HTN, CAD who presents to the ED with back pain. No neurological deficits appreciated. Patient is ambulatory. No warning symptoms of back pain including: fecal incontinence, urinary retention or overflow incontinence, night sweats, waking from sleep with back pain, unexplained fevers or weight loss, h/o cancer, IVDU, recent trauma. No concern for cauda equina, epidural abscess, or other serious cause of back pain. Discussed with supervising physician who has seen patient and agrees with plan and care. Conservative measures such as rest, ice/heat and pain medicine indicated with PCP follow-up if no improvement with conservative management.   Disposition/Plan:  DC Home Additional Verbal discharge instructions given and discussed with  patient.  Pt Instructed to f/u with PCP in the next week for evaluation and treatment of symptoms. Return precautions given Pt acknowledges and agrees with plan  Supervising Physician Leo Grosser, MD   Final diagnoses:  Right-sided low back pain with right-sided sciatica     Shary Decamp, PA-C 08/04/15 2028  Leo Grosser, MD 08/05/15 0145

## 2015-08-04 NOTE — Discharge Instructions (Signed)
Please read and follow all provided instructions.  Your diagnoses today include:  1. Right-sided low back pain with right-sided sciatica    Tests performed today include:  Vital signs - see below for your results today  Medications prescribed:   Take any prescribed medications only as directed.  Home care instructions:   Follow any educational materials contained in this packet  Please rest, use ice or heat on your back for the next several days  Do not lift, push, pull anything more than 10 pounds for the next week  Follow-up instructions: Please follow-up with your primary care provider in the next 1 week for further evaluation of your symptoms.   Return instructions:  SEEK IMMEDIATE MEDICAL ATTENTION IF YOU HAVE:  New numbness, tingling, weakness, or problem with the use of your arms or legs  Severe back pain not relieved with medications  Loss control of your bowels or bladder  Increasing pain in any areas of the body (such as chest or abdominal pain)  Shortness of breath, dizziness, or fainting.   Worsening nausea (feeling sick to your stomach), vomiting, fever, or sweats  Any other emergent concerns regarding your health   Additional Information:  Your vital signs today were: BP 146/65 mmHg   Pulse 71   Temp(Src) 98.3 F (36.8 C) (Oral)   Resp 18   Ht 5\' 9"  (1.753 m)   Wt 83.008 kg   BMI 27.01 kg/m2   SpO2 100% If your blood pressure (BP) was elevated above 135/85 this visit, please have this repeated by your doctor within one month. --------------

## 2015-09-10 ENCOUNTER — Encounter: Payer: Self-pay | Admitting: Internal Medicine

## 2015-10-20 ENCOUNTER — Other Ambulatory Visit: Payer: Self-pay | Admitting: Family Medicine

## 2015-10-20 NOTE — Telephone Encounter (Signed)
Rx refill sent to pharmacy. 

## 2015-11-12 ENCOUNTER — Ambulatory Visit (INDEPENDENT_AMBULATORY_CARE_PROVIDER_SITE_OTHER): Payer: Medicare HMO | Admitting: Family Medicine

## 2015-11-12 DIAGNOSIS — Z23 Encounter for immunization: Secondary | ICD-10-CM

## 2015-11-13 ENCOUNTER — Encounter: Payer: Self-pay | Admitting: Family Medicine

## 2015-11-13 NOTE — Progress Notes (Signed)
   Subjective:    Patient ID: [name]., male    DOB: 04/24/1927, 82 y.o.   MRN: [id number]  HPI He is given a flu shot.   Review of Systems     Objective:   Physical Exam        Assessment & Plan:

## 2015-12-03 DIAGNOSIS — Z7689 Persons encountering health services in other specified circumstances: Secondary | ICD-10-CM

## 2015-12-04 ENCOUNTER — Other Ambulatory Visit: Payer: Self-pay | Admitting: Family Medicine

## 2015-12-19 ENCOUNTER — Telehealth: Payer: Self-pay | Admitting: Family Medicine

## 2015-12-19 NOTE — Telephone Encounter (Signed)
Pt states he always goes to [place] for his CPX labs because he lives down the street from their lab. Would like to go again this year, has cpx on 12/05.  Can you put the orders in?

## 2015-12-23 ENCOUNTER — Other Ambulatory Visit: Payer: Self-pay | Admitting: Family Medicine

## 2015-12-23 DIAGNOSIS — Z0000 Encounter for general adult medical examination without abnormal findings: Secondary | ICD-10-CM

## 2015-12-23 NOTE — Telephone Encounter (Signed)
Per Dr. Sarajane Jews okay to order. I did put in future lab orders for [place] office and spoke with pt.

## 2016-01-20 ENCOUNTER — Other Ambulatory Visit (INDEPENDENT_AMBULATORY_CARE_PROVIDER_SITE_OTHER): Payer: Medicare HMO

## 2016-01-20 DIAGNOSIS — Z0000 Encounter for general adult medical examination without abnormal findings: Secondary | ICD-10-CM | POA: Diagnosis not present

## 2016-01-20 LAB — BASIC METABOLIC PANEL
BUN: 20 mg/dL (ref 6–23)
CHLORIDE: 104 meq/L (ref 96–112)
CO2: 31 mEq/L (ref 19–32)
Calcium: 9.1 mg/dL (ref 8.4–10.5)
Creatinine, Ser: 0.93 mg/dL (ref 0.40–1.50)
GFR: 81.82 mL/min (ref >60.00)
Glucose, Bld: 146 mg/dL — ABNORMAL HIGH (ref 70–99)
POTASSIUM: 4.3 meq/L (ref 3.5–5.1)
SODIUM: 142 meq/L (ref 135–145)

## 2016-01-20 LAB — URINALYSIS
BILIRUBIN URINE: NEGATIVE
Hgb urine dipstick: NEGATIVE
KETONES UR: NEGATIVE
Leukocytes, UA: NEGATIVE
NITRITE: NEGATIVE
PH: 5.5 (ref 5.0–8.0)
Specific Gravity, Urine: 1.025 (ref 1.000–1.030)
TOTAL PROTEIN, URINE-UPE24: NEGATIVE
URINE GLUCOSE: NEGATIVE
Urobilinogen, UA: 0.2 (ref 0.0–1.0)

## 2016-01-20 LAB — TSH: TSH: 1.79 u[IU]/mL (ref 0.35–4.50)

## 2016-01-20 LAB — HEPATIC FUNCTION PANEL
ALBUMIN: 3.8 g/dL (ref 3.5–5.2)
ALK PHOS: 64 U/L (ref 39–117)
ALT: 21 U/L (ref 0–53)
AST: 21 U/L (ref 0–37)
Bilirubin, Direct: 0.1 mg/dL (ref 0.0–0.3)
TOTAL PROTEIN: 6.3 g/dL (ref 6.0–8.3)
Total Bilirubin: 0.6 mg/dL (ref 0.2–1.2)

## 2016-01-20 LAB — LIPID PANEL
CHOLESTEROL: 103 mg/dL (ref 0–200)
HDL: 41.5 mg/dL (ref >39.00)
LDL Cholesterol: 42 mg/dL (ref 0–99)
NonHDL: 61.79
TRIGLYCERIDES: 97 mg/dL (ref 0.0–149.0)
Total CHOL/HDL Ratio: 2
VLDL: 19.4 mg/dL (ref 0.0–40.0)

## 2016-01-20 LAB — PSA: PSA: 10.34 ng/mL — ABNORMAL HIGH (ref 0.10–4.00)

## 2016-01-21 LAB — CBC WITH DIFFERENTIAL/PLATELET
BASOS ABS: 0 10*3/uL (ref 0.0–0.1)
BASOS PCT: 0.4 % (ref 0.0–3.0)
EOS ABS: 0.1 10*3/uL (ref 0.0–0.7)
Eosinophils Relative: 1.7 % (ref 0.0–5.0)
HCT: 40.8 % (ref 39.0–52.0)
HEMOGLOBIN: 13.8 g/dL (ref 13.0–17.0)
LYMPHS PCT: 26.6 % (ref 12.0–46.0)
Lymphs Abs: 1.7 10*3/uL (ref 0.7–4.0)
MCHC: 33.7 g/dL (ref 30.0–36.0)
MCV: 90.6 fl (ref 78.0–100.0)
MONO ABS: 0.4 10*3/uL (ref 0.1–1.0)
Monocytes Relative: 6 % (ref 3.0–12.0)
NEUTROS ABS: 4.2 10*3/uL (ref 1.4–7.7)
Neutrophils Relative %: 65.3 % (ref 43.0–77.0)
PLATELETS: 181 10*3/uL (ref 150.0–400.0)
RBC: 4.51 Mil/uL (ref 4.22–5.81)
RDW: 13.3 % (ref 11.5–15.5)
WBC: 6.4 10*3/uL (ref 4.0–10.5)

## 2016-01-23 NOTE — Progress Notes (Signed)
Called and left a voicemail for pt to return call to office.

## 2016-01-27 ENCOUNTER — Encounter: Payer: Self-pay | Admitting: Family Medicine

## 2016-01-27 ENCOUNTER — Ambulatory Visit (INDEPENDENT_AMBULATORY_CARE_PROVIDER_SITE_OTHER): Payer: Medicare HMO | Admitting: Family Medicine

## 2016-01-27 VITALS — BP 122/67 | HR 63 | Temp 98.7°F | Ht 69.0 in | Wt 171.0 lb

## 2016-01-27 DIAGNOSIS — R739 Hyperglycemia, unspecified: Secondary | ICD-10-CM | POA: Diagnosis not present

## 2016-01-27 DIAGNOSIS — Z0000 Encounter for general adult medical examination without abnormal findings: Secondary | ICD-10-CM | POA: Diagnosis not present

## 2016-01-27 LAB — HEMOGLOBIN A1C: HEMOGLOBIN A1C: 5.6 % (ref 4.6–6.5)

## 2016-01-27 MED ORDER — METOPROLOL TARTRATE 25 MG PO TABS: 25.0000 mg | ORAL_TABLET | Freq: Two times a day (BID) | ORAL | 3 refills | Status: DC

## 2016-01-27 MED ORDER — LEVOTHYROXINE SODIUM 50 MCG PO TABS: 50.0000 ug | ORAL_TABLET | Freq: Every morning | ORAL | 3 refills | Status: DC

## 2016-01-27 MED ORDER — ATORVASTATIN CALCIUM 10 MG PO TABS: ORAL_TABLET | ORAL | 3 refills | Status: DC

## 2016-01-27 MED ORDER — FUROSEMIDE 20 MG PO TABS: 20.0000 mg | ORAL_TABLET | Freq: Every day | ORAL | 3 refills | Status: DC

## 2016-01-27 NOTE — Progress Notes (Signed)
Pre visit review using our clinic review tool, if applicable. No additional management support is needed unless otherwise documented below in the visit note. 

## 2016-01-27 NOTE — Progress Notes (Signed)
   Subjective:    Patient ID: [name]., male    DOB: August 03, 1927, 82 y.o.   MRN: [id number]  HPI 82 yr old male for a well exam. He feels fine. His BP is stable. He will see Cardiology in January. He sees Dr. Tresa Moore for prostate checks twice a year. His recent fasting glucose was 146 and he admits to eating a lot of sweets in his diet. His sister has type 2 diabetes.    Review of Systems  Constitutional: Negative.   HENT: Negative.   Eyes: Negative.   Respiratory: Negative.   Cardiovascular: Negative.   Gastrointestinal: Negative.   Genitourinary: Negative.   Musculoskeletal: Negative.   Skin: Negative.   Neurological: Negative.   Psychiatric/Behavioral: Negative.        Objective:   Physical Exam  Constitutional: He is oriented to person, place, and time. He appears well-developed and well-nourished. No distress.  HENT:  Head: Normocephalic and atraumatic.  Right Ear: External ear normal.  Left Ear: External ear normal.  Nose: Nose normal.  Mouth/Throat: Oropharynx is clear and moist. No oropharyngeal exudate.  Eyes: Conjunctivae and EOM are normal. Pupils are equal, round, and reactive to light. Right eye exhibits no discharge. Left eye exhibits no discharge. No scleral icterus.  Neck: Neck supple. No JVD present. No tracheal deviation present. No thyromegaly present.  Cardiovascular: Normal rate, regular rhythm, normal heart sounds and intact distal pulses.  Exam reveals no gallop and no friction rub.   No murmur heard. Pulmonary/Chest: Effort normal and breath sounds normal. No respiratory distress. He has no wheezes. He has no rales. He exhibits no tenderness.  Abdominal: Soft. Bowel sounds are normal. He exhibits no distension and no mass. There is no tenderness. There is no rebound and no guarding.  Musculoskeletal: Normal range of motion. He exhibits no edema or tenderness.  Lymphadenopathy:    He has no cervical adenopathy.  Neurological: He is alert and  oriented to person, place, and time. He has normal reflexes. No cranial nerve deficit. He exhibits normal muscle tone. Coordination normal.  Skin: Skin is warm and dry. No rash noted. He is not diaphoretic. No erythema. No pallor.  Psychiatric: He has a normal mood and affect. His behavior is normal. Judgment and thought content normal.          Assessment & Plan:  Well exam. We discussed diet and exercise. Check a baseline A1c today.  [name], MD

## 2016-03-09 DIAGNOSIS — R69 Illness, unspecified: Secondary | ICD-10-CM | POA: Diagnosis not present

## 2016-05-01 ENCOUNTER — Other Ambulatory Visit: Payer: Self-pay | Admitting: Family Medicine

## 2016-06-30 ENCOUNTER — Telehealth: Payer: Self-pay | Admitting: Family Medicine

## 2016-06-30 NOTE — Telephone Encounter (Signed)
Pt left a voice message, requesting PSA for upcoming Urology appointment and also might be due for a A1c, would like to get labs done at [facility].

## 2016-07-01 ENCOUNTER — Other Ambulatory Visit (INDEPENDENT_AMBULATORY_CARE_PROVIDER_SITE_OTHER): Payer: Medicare HMO

## 2016-07-01 ENCOUNTER — Other Ambulatory Visit: Payer: Self-pay | Admitting: Family Medicine

## 2016-07-01 DIAGNOSIS — R972 Elevated prostate specific antigen [PSA]: Secondary | ICD-10-CM | POA: Diagnosis not present

## 2016-07-01 DIAGNOSIS — R739 Hyperglycemia, unspecified: Secondary | ICD-10-CM | POA: Diagnosis not present

## 2016-07-01 LAB — HEMOGLOBIN A1C: HEMOGLOBIN A1C: 5.6 % (ref 4.6–6.5)

## 2016-07-01 LAB — PSA: PSA: 11.43 ng/mL — ABNORMAL HIGH (ref 0.10–4.00)

## 2016-07-01 NOTE — Telephone Encounter (Signed)
Please set up both of these tests as he wishes

## 2016-07-01 NOTE — Telephone Encounter (Signed)
I put both lab orders in computer for [place] location, per pt request and spoke with pt.

## 2016-07-06 DIAGNOSIS — R3915 Urgency of urination: Secondary | ICD-10-CM | POA: Diagnosis not present

## 2016-07-06 DIAGNOSIS — C61 Malignant neoplasm of prostate: Secondary | ICD-10-CM | POA: Diagnosis not present

## 2016-10-05 DIAGNOSIS — R69 Illness, unspecified: Secondary | ICD-10-CM | POA: Diagnosis not present

## 2016-10-15 ENCOUNTER — Encounter: Payer: Self-pay | Admitting: Internal Medicine

## 2016-10-15 ENCOUNTER — Ambulatory Visit (INDEPENDENT_AMBULATORY_CARE_PROVIDER_SITE_OTHER): Payer: Medicare HMO | Admitting: Internal Medicine

## 2016-10-15 VITALS — BP 128/78 | HR 65 | Ht 69.0 in | Wt 179.8 lb

## 2016-10-15 DIAGNOSIS — I480 Paroxysmal atrial fibrillation: Secondary | ICD-10-CM

## 2016-10-15 DIAGNOSIS — E782 Mixed hyperlipidemia: Secondary | ICD-10-CM

## 2016-10-15 DIAGNOSIS — I2510 Atherosclerotic heart disease of native coronary artery without angina pectoris: Secondary | ICD-10-CM | POA: Diagnosis not present

## 2016-10-15 NOTE — Progress Notes (Signed)
Cardiology Office Note   Date:  10/15/2016   ID:  [name]., DOB [birth year], MRN [id number]  PCP:  [name], MD  Cardiologist:   Dorris Carnes, MD   No chief complaint on file.  F/U of CAD   History of Present Illness: [name]. is a 83 y.o. male with a history ofPAF, HTN, HL,  hypothyroidism, prostate CA. and CAD   IN 2015. LHC demonstrated 3 v CAD and was referred for CABG + MAZE procedure. Intraoperative TEE demonstrated MVP with mod to severe MR. He underwent CABG (L-LAD, S-OM1), MV repair and MAZE with cryothermy ablation clipping of LAA. Post op course c/b AFib and was placed on Amiodarone. Carotid US (02/27/13): 1-39% bilateral.    I saw him in 2017  Pt denies CP   Breathing is OK Moved to [facility]   Current Outpatient Prescriptions  Medication Sig Dispense Refill  . amoxicillin (AMOXIL) 500 MG capsule TAKE 4 CAPSULES BY MOUTH 1 HOUR prior TO dental appointment.  1  . aspirin EC 325 MG tablet Take 1 tablet (325 mg total) by mouth daily. 30 tablet 0  . atorvastatin (LIPITOR) 10 MG tablet TAKE 1 TABLET BY MOUTH EVERY DAY AT 6:00 PM 90 tablet 3  . Cholecalciferol (VITAMIN D3 PO) Take 2,000 Units by mouth daily.    . fish oil-omega-3 fatty acids 1000 MG capsule Take 1 g by mouth every morning.     . furosemide (LASIX) 20 MG tablet TAKE 1 TABLET BY MOUTH EVERY DAY 90 tablet 1  . levothyroxine (SYNTHROID, LEVOTHROID) 50 MCG tablet Take 1 tablet (50 mcg total) by mouth every morning. 90 tablet 3  . meclizine (ANTIVERT) 25 MG tablet Take 1 tablet (25 mg total) by mouth 3 (three) times daily as needed. 20 tablet 0  . metoprolol tartrate (LOPRESSOR) 25 MG tablet Take 1 tablet (25 mg total) by mouth 2 (two) times daily. 180 tablet 3  . nitroGLYCERIN (NITROSTAT) 0.4 MG SL tablet Place 1 tablet (0.4 mg total) under the tongue every 5 (five) minutes as needed for chest pain. 25 tablet 3  . vitamin C (ASCORBIC ACID) 500 MG tablet Take 500 mg by mouth  every morning.      No current facility-administered medications for this visit.     Allergies:   Other   Past Medical History:  Diagnosis Date  . Complication of anesthesia    "serious cognisance problems since my OR in January" (03/13/2013  . Coronary artery disease   . Hx of echocardiogram    Echo 5/16:  Mild focal basal septal hypertrophy, EF 55%, mild AI, MV repair ok with trivial MR (mean 3 mmHg), mild LAE, mild RAE, PASP 35 mmHg  . Hypertension   . Irritable bowel syndrome   . PAF (paroxysmal atrial fibrillation) ([place]) 05/03/2014  . Personal history of colonic polyps    tubular adenoma  . Pleural effusion, bilateral 03/12/2013   Small to moderate, L>R  . Prostate cancer [facility]) 1991   sees Dr. Carlota Raspberry at [facility], observation only   . S/P CABG x 2 02/28/2013   LIMA to LAD, SVG to OM1, EVH via right thigh  . S/P Maze operation for atrial fibrillation 02/28/2013   Complete bilateral atrial lesion set using bipolar radiofrequency and cryothermy ablation with clipping of LA appendage  . S/P mitral valve repair, maze procedure, and CABG x2 02/28/2013   Complex valvuloplasty including triangular resection of posterior leaflet, 30 mm Sorin  Memo 3D ring annuloplasty    Past Surgical History:  Procedure Laterality Date  . CARDIAC CATHETERIZATION    . CATARACT EXTRACTION, BILATERAL Bilateral 2014   per Dr. Gershon Crane   . COLONOSCOPY  2011   per Dr. Sharlett Iles, benign polyps, no repeats needed   . CORONARY ARTERY BYPASS GRAFT N/A 02/28/2013   Procedure: CORONARY ARTERY BYPASS GRAFTING (CABG);  Surgeon: Rexene Alberts, MD;  Location: [place];  Service: Open Heart Surgery;  Laterality: N/A;  CABG x 2,  using left internal mammary artery and right leg saphenous vein harvested endoscopically  . HEMORRHOID SURGERY    . INTRAOPERATIVE TRANSESOPHAGEAL ECHOCARDIOGRAM N/A 02/28/2013   Procedure: INTRAOPERATIVE TRANSESOPHAGEAL ECHOCARDIOGRAM;  Surgeon: Rexene Alberts, MD;  Location: [place];  Service: Open  Heart Surgery;  Laterality: N/A;  . LEFT HEART CATHETERIZATION WITH CORONARY ANGIOGRAM N/A 02/26/2013   Procedure: LEFT HEART CATHETERIZATION WITH CORONARY ANGIOGRAM;  Surgeon: Leonie Man, MD;  Location: [facility] CATH LAB;  Service: Cardiovascular;  Laterality: N/A;  . MAZE N/A 02/28/2013   Procedure: MAZE;  Surgeon: Rexene Alberts, MD;  Location: [place];  Service: Open Heart Surgery;  Laterality: N/A;  . MITRAL VALVE REPAIR N/A 02/28/2013   Procedure: MITRAL VALVE REPAIR (MVR);  Surgeon: Rexene Alberts, MD;  Location: [place];  Service: Open Heart Surgery;  Laterality: N/A;  . PROSTATE BIOPSY       Social History:  The patient  reports that he has never smoked. He has never used smokeless tobacco. He reports that he does not drink alcohol or use drugs.   Family History:  The patient's family history includes Cancer in his brother and daughter; Diabetes in his sister and unknown relative; Heart disease in his son and unknown relative; Hypertension in his father, mother, and unknown relative; Lung cancer in his brother; Stomach cancer in his brother; Stroke in his unknown relative; Sudden death in his father, mother, and unknown relative.    ROS:  Please see the history of present illness. All other systems are reviewed and  Negative to the above problem except as noted.    PHYSICAL EXAM: VS:  BP 128/78   Pulse 65   Ht 5\' 9"  (1.753 m)   Wt 179 lb 12.8 oz (81.6 kg)   BMI 26.55 kg/m   GEN: Well nourished, well developed, in no acute distress HEENT: normal Neck: no JVD, carotid bruits, or masses Cardiac: RRR; no murmurs, rubs, or gallops,no edema  Respiratory:  clear to auscultation bilaterally, normal work of breathing GI: soft, nontender, nondistended, + BS  No hepatomegaly  MS: no deformity Moving all extremities   Skin: warm and dry, no rash Neuro:  Strength and sensation are intact Psych: euthymic mood, full affect   EKG:  EKG is  ordered today. SR 63 with frequ PACs  PR interval 206  msec Lipid Panel    Component Value Date/Time   CHOL 103 01/20/2016 1556   TRIG 97.0 01/20/2016 1556   HDL 41.50 01/20/2016 1556   CHOLHDL 2 01/20/2016 1556   VLDL 19.4 01/20/2016 1556   LDLCALC 42 01/20/2016 1556   LDLDIRECT 159.3 05/17/2007 0845      Wt Readings from Last 3 Encounters:  10/15/16 179 lb 12.8 oz (81.6 kg)  01/27/16 171 lb (77.6 kg)  08/04/15 183 lb (83 kg)      ASSESSMENT AND PLAN:  1  CAD No symptoms to sugg angina  Follow   2  MV  S/p repair No  murmurs on exam    3.  Afib  S/p MAZE and LAA clipping  NO symptomatic recurrences  COntinue metoprolol   4  HL  Good on statin    5  Dizziness Denies   6.  Shoulder  Pain  Resolved   Disposition:   FU with me in 1 year      Signed, Dorris Carnes, MD  10/15/2016 11:00 AM    [address] [place], [place], [place]  [address] Phone: [phone]; Fax: [phone]

## 2016-10-15 NOTE — Patient Instructions (Signed)
Your physician recommends that you continue on your current medications as directed. Please refer to the Current Medication list given to you today. Your physician wants you to follow-up in: 1 year with Dr. Ross.  You will receive a reminder letter in the mail two months in advance. If you don't receive a letter, please call our office to schedule the follow-up appointment.  

## 2016-11-11 ENCOUNTER — Encounter: Payer: Self-pay | Admitting: Family Medicine

## 2016-11-15 ENCOUNTER — Ambulatory Visit (INDEPENDENT_AMBULATORY_CARE_PROVIDER_SITE_OTHER): Payer: Medicare HMO | Admitting: Family Medicine

## 2016-11-15 DIAGNOSIS — Z23 Encounter for immunization: Secondary | ICD-10-CM

## 2016-11-16 DIAGNOSIS — R69 Illness, unspecified: Secondary | ICD-10-CM | POA: Diagnosis not present

## 2017-01-14 ENCOUNTER — Encounter: Payer: Self-pay | Admitting: Family Medicine

## 2017-01-14 ENCOUNTER — Ambulatory Visit (INDEPENDENT_AMBULATORY_CARE_PROVIDER_SITE_OTHER): Payer: Medicare HMO | Admitting: Family Medicine

## 2017-01-14 VITALS — BP 140/76 | HR 70 | Temp 98.5°F | Ht 69.0 in | Wt 185.5 lb

## 2017-01-14 DIAGNOSIS — R350 Frequency of micturition: Secondary | ICD-10-CM

## 2017-01-14 LAB — POCT URINALYSIS DIPSTICK
BILIRUBIN UA: NEGATIVE
Glucose, UA: NEGATIVE
KETONES UA: NEGATIVE
Leukocytes, UA: NEGATIVE
NITRITE UA: NEGATIVE
PH UA: 6 (ref 5.0–8.0)
Protein, UA: NEGATIVE
RBC UA: NEGATIVE
Spec Grav, UA: 1.015 (ref 1.010–1.025)
Urobilinogen, UA: 0.2 E.U./dL

## 2017-01-14 MED ORDER — MIRABEGRON ER 25 MG PO TB24: 25.0000 mg | ORAL_TABLET | Freq: Every day | ORAL | 1 refills | Status: DC

## 2017-01-14 NOTE — Progress Notes (Signed)
   Subjective:    Patient ID: Luis Buba., male    DOB: 11/11/1927, 83 y.o.   MRN: [id number]  HPI Urinary frequency- 'almost a continual flow of urine' since Wednesday.  Hx of 'a little leakage' and long standing prostate cancer (since 1999).  Increased leaking at night- started wearing Depends on Wednesday night and in the AM, 'it was absolutely soaked' and this was in addition to going to the bathroom multiple times overnight.  Also had to change his Depend during the day due to risk of overflow/leaking.  Tuesday drank 'a quart of lemon lime soda'.  Denies blood or pain w/ urination.  Some intermittent R groin pain.  Called urologist this AM and was told sxs are not prostate related.  Flow has not slowed since Wed.   Review of Systems For ROS see HPI     Objective:   Physical Exam  Constitutional: He appears well-developed and well-nourished. No distress.  HENT:  Head: Normocephalic and atraumatic.  Abdominal: Soft. Bowel sounds are normal. He exhibits no distension. There is no tenderness (no suprapubic or CVA tenderness.  no inguinal TTP or obvious bulge on PE). There is no rebound and no guarding.  Genitourinary:  Genitourinary Comments: Pt deferred GU exam today- 'I don't have any pain'  Neurological: He is alert.  Skin: Skin is warm and dry.  Vitals reviewed.         Assessment & Plan:  Frequency of urination- pt acutely developed high volume urinary frequency and incontinence starting Wednesday.  Is now soaking through Depends.  No evidence of UTI on UA today.  Encouraged pt to call Urology on Monday and in the meantime, will start Myrbetriq to help w/ symptoms (if insurance approves).  Will not consider other bladder agents given his multiple cardiac comorbidities.  Reviewed supportive care and red flags that should prompt return.  Pt expressed understanding and is in agreement w/ plan.

## 2017-01-14 NOTE — Patient Instructions (Signed)
Follow up as needed or as scheduled Please call your urologist and schedule an appt on Monday Start the Myrbetriq once daily (this may not be approved by your insurance but we're going to try!) Continue to wear the depends Limit caffeine intake- stick with water! Call with any questions or concerns Hang in there!

## 2017-01-18 DIAGNOSIS — R338 Other retention of urine: Secondary | ICD-10-CM | POA: Diagnosis not present

## 2017-01-18 DIAGNOSIS — R3915 Urgency of urination: Secondary | ICD-10-CM | POA: Diagnosis not present

## 2017-01-18 DIAGNOSIS — C61 Malignant neoplasm of prostate: Secondary | ICD-10-CM | POA: Diagnosis not present

## 2017-01-21 ENCOUNTER — Other Ambulatory Visit: Payer: Self-pay | Admitting: Urology

## 2017-01-26 ENCOUNTER — Ambulatory Visit (INDEPENDENT_AMBULATORY_CARE_PROVIDER_SITE_OTHER): Payer: Medicare HMO | Admitting: Family Medicine

## 2017-01-26 ENCOUNTER — Encounter: Payer: Self-pay | Admitting: Family Medicine

## 2017-01-26 VITALS — BP 122/80 | Temp 98.4°F | Ht 69.0 in | Wt 172.2 lb

## 2017-01-26 DIAGNOSIS — I10 Essential (primary) hypertension: Secondary | ICD-10-CM | POA: Diagnosis not present

## 2017-01-26 DIAGNOSIS — E782 Mixed hyperlipidemia: Secondary | ICD-10-CM

## 2017-01-26 DIAGNOSIS — R972 Elevated prostate specific antigen [PSA]: Secondary | ICD-10-CM

## 2017-01-26 DIAGNOSIS — K219 Gastro-esophageal reflux disease without esophagitis: Secondary | ICD-10-CM

## 2017-01-26 DIAGNOSIS — C61 Malignant neoplasm of prostate: Secondary | ICD-10-CM | POA: Diagnosis not present

## 2017-01-26 DIAGNOSIS — I5032 Chronic diastolic (congestive) heart failure: Secondary | ICD-10-CM | POA: Diagnosis not present

## 2017-01-26 DIAGNOSIS — E039 Hypothyroidism, unspecified: Secondary | ICD-10-CM | POA: Diagnosis not present

## 2017-01-26 DIAGNOSIS — I480 Paroxysmal atrial fibrillation: Secondary | ICD-10-CM

## 2017-01-26 LAB — CBC WITH DIFFERENTIAL/PLATELET
BASOS ABS: 0 10*3/uL (ref 0.0–0.1)
Basophils Relative: 0.6 % (ref 0.0–3.0)
EOS PCT: 1.9 % (ref 0.0–5.0)
Eosinophils Absolute: 0.1 10*3/uL (ref 0.0–0.7)
HCT: 39 % (ref 39.0–52.0)
HEMOGLOBIN: 13.3 g/dL (ref 13.0–17.0)
LYMPHS ABS: 1.1 10*3/uL (ref 0.7–4.0)
Lymphocytes Relative: 16.6 % (ref 12.0–46.0)
MCHC: 34 g/dL (ref 30.0–36.0)
MCV: 91.7 fl (ref 78.0–100.0)
MONO ABS: 0.4 10*3/uL (ref 0.1–1.0)
Monocytes Relative: 6.6 % (ref 3.0–12.0)
NEUTROS PCT: 74.3 % (ref 43.0–77.0)
Neutro Abs: 5 10*3/uL (ref 1.4–7.7)
Platelets: 241 10*3/uL (ref 150.0–400.0)
RBC: 4.25 Mil/uL (ref 4.22–5.81)
RDW: 13.1 % (ref 11.5–15.5)
WBC: 6.8 10*3/uL (ref 4.0–10.5)

## 2017-01-26 LAB — TSH: TSH: 3.09 u[IU]/mL (ref 0.35–4.50)

## 2017-01-26 LAB — HEPATIC FUNCTION PANEL
ALBUMIN: 3.6 g/dL (ref 3.5–5.2)
ALT: 15 U/L (ref 0–53)
AST: 18 U/L (ref 0–37)
Alkaline Phosphatase: 60 U/L (ref 39–117)
BILIRUBIN TOTAL: 0.7 mg/dL (ref 0.2–1.2)
Bilirubin, Direct: 0.2 mg/dL (ref 0.0–0.3)
Total Protein: 6.5 g/dL (ref 6.0–8.3)

## 2017-01-26 LAB — BASIC METABOLIC PANEL
BUN: 26 mg/dL — AB (ref 6–23)
CO2: 26 mEq/L (ref 19–32)
Calcium: 8.7 mg/dL (ref 8.4–10.5)
Chloride: 103 mEq/L (ref 96–112)
Creatinine, Ser: 1.53 mg/dL — ABNORMAL HIGH (ref 0.40–1.50)
GFR: 45.95 mL/min — AB (ref >60.00)
GLUCOSE: 91 mg/dL (ref 70–99)
Potassium: 4.1 mEq/L (ref 3.5–5.1)
Sodium: 139 mEq/L (ref 135–145)

## 2017-01-26 LAB — LIPID PANEL
CHOL/HDL RATIO: 3
CHOLESTEROL: 111 mg/dL (ref 0–200)
HDL: 33.1 mg/dL — AB (ref >39.00)
LDL CALC: 56 mg/dL (ref 0–99)
NonHDL: 77.46
TRIGLYCERIDES: 108 mg/dL (ref 0.0–149.0)
VLDL: 21.6 mg/dL (ref 0.0–40.0)

## 2017-01-26 LAB — PSA: PSA: 10.84 ng/mL — AB (ref 0.10–4.00)

## 2017-01-26 MED ORDER — METOPROLOL TARTRATE 25 MG PO TABS: 25.0000 mg | ORAL_TABLET | Freq: Two times a day (BID) | ORAL | 3 refills | Status: DC

## 2017-01-26 MED ORDER — LEVOTHYROXINE SODIUM 50 MCG PO TABS: 50.0000 ug | ORAL_TABLET | Freq: Every morning | ORAL | 3 refills | Status: DC

## 2017-01-26 MED ORDER — ATORVASTATIN CALCIUM 10 MG PO TABS: ORAL_TABLET | ORAL | 3 refills | Status: DC

## 2017-01-26 MED ORDER — FUROSEMIDE 20 MG PO TABS: 20.0000 mg | ORAL_TABLET | Freq: Every day | ORAL | 3 refills | Status: DC

## 2017-01-26 NOTE — Progress Notes (Signed)
   Subjective:    Patient ID: Luis Buba., male    DOB: Apr 14, 1927, 83 y.o.   MRN: [id number]  HPI Here to follow up on issues. He has been doing well with the exception of some bladder outlet obstruction. He is currently wearing a foley catheter per Dr. Tresa Moore and he is scheduled for a TURP on 02-04-17. Otherwise he has been active and getting around fine. His BP and heart rate have been stable at home.    Review of Systems  Constitutional: Negative.   HENT: Negative.   Eyes: Negative.   Respiratory: Negative.   Cardiovascular: Negative.   Gastrointestinal: Negative.   Genitourinary: Positive for difficulty urinating. Negative for dysuria, flank pain and hematuria.  Musculoskeletal: Negative.   Skin: Negative.   Neurological: Negative.   Psychiatric/Behavioral: Negative.        Objective:   Physical Exam  Constitutional: He is oriented to person, place, and time. He appears well-developed and well-nourished. No distress.  HENT:  Head: Normocephalic and atraumatic.  Right Ear: External ear normal.  Left Ear: External ear normal.  Nose: Nose normal.  Mouth/Throat: Oropharynx is clear and moist. No oropharyngeal exudate.  Eyes: Conjunctivae and EOM are normal. Pupils are equal, round, and reactive to light. Right eye exhibits no discharge. Left eye exhibits no discharge. No scleral icterus.  Neck: Neck supple. No JVD present. No tracheal deviation present. No thyromegaly present.  Cardiovascular: Normal rate, normal heart sounds and intact distal pulses. Exam reveals no gallop and no friction rub.  No murmur heard. Irregular rhythm   Pulmonary/Chest: Effort normal and breath sounds normal. No respiratory distress. He has no wheezes. He has no rales. He exhibits no tenderness.  Abdominal: Soft. Bowel sounds are normal. He exhibits no distension and no mass. There is no tenderness. There is no rebound and no guarding.  Musculoskeletal: Normal range of motion. He exhibits no  edema or tenderness.  Lymphadenopathy:    He has no cervical adenopathy.  Neurological: He is alert and oriented to person, place, and time. He has normal reflexes. No cranial nerve deficit. He exhibits normal muscle tone. Coordination normal.  Skin: Skin is warm and dry. No rash noted. He is not diaphoretic. No erythema. No pallor.  Psychiatric: He has a normal mood and affect. His behavior is normal. Judgment and thought content normal.          Assessment & Plan:  His atrial fibrillation and HTN are stable. We will get fasting labs today to check his lipids and his thyroid level. He has an upcoming TURP as noted.  Alysia Penna, MD

## 2017-01-31 ENCOUNTER — Other Ambulatory Visit: Payer: Self-pay | Admitting: *Deleted

## 2017-01-31 ENCOUNTER — Encounter (HOSPITAL_COMMUNITY): Payer: Self-pay

## 2017-01-31 DIAGNOSIS — R7989 Other specified abnormal findings of blood chemistry: Secondary | ICD-10-CM

## 2017-01-31 NOTE — Progress Notes (Addendum)
  01-26-17 (Epic) CBC w/Diff, BMP  10-15-16 (Epic) EKG  10-15-16 [facility]. F/u in 60yr.

## 2017-01-31 NOTE — Patient Instructions (Signed)
[name].  01/31/2017   Your procedure is scheduled on: 02-04-17   Report to [facility] Main  Entrance Take [place]  Elevators to 3rd floor to  [place] at 9:00 AM.   Call this number if you have problems the morning of surgery [phone]    Remember: ONLY 1 PERSON MAY GO WITH YOU TO SHORT STAY TO GET  READY MORNING OF [place].  Do not eat food or drink liquids :After Midnight.     Take these medicines the morning of surgery with A SIP OF WATER: Levothyroxine (Synthroid) and Metoprolol Tartrate (Lopressor)                               You may not have any metal on your body including hair pins and              piercings  Do not wear jewelry, lotions, powders or deodorant             Men may shave face and neck.   Do not bring valuables to the hospital. [place].  Contacts, dentures or bridgework may not be worn into surgery.  Leave suitcase in the car. After surgery it may be brought to your room.               Please read over the following fact sheets you were given: _____________________________________________________________________             [facility] - Preparing for Surgery Before surgery, you can play an important role.  Because skin is not sterile, your skin needs to be as free of germs as possible.  You can reduce the number of germs on your skin by washing with CHG (chlorahexidine gluconate) soap before surgery.  CHG is an antiseptic cleaner which kills germs and bonds with the skin to continue killing germs even after washing. Please DO NOT use if you have an allergy to CHG or antibacterial soaps.  If your skin becomes reddened/irritated stop using the CHG and inform your nurse when you arrive at Short Stay. Do not shave (including legs and underarms) for at least 48 hours prior to the first CHG shower.  You may shave your face/neck. Please follow these instructions  carefully:  1.  Shower with CHG Soap the night before surgery and the  morning of Surgery.  2.  If you choose to wash your hair, wash your hair first as usual with your  normal  shampoo.  3.  After you shampoo, rinse your hair and body thoroughly to remove the  shampoo.                           4.  Use CHG as you would any other liquid soap.  You can apply chg directly  to the skin and wash                       Gently with a scrungie or clean washcloth.  5.  Apply the CHG Soap to your body ONLY FROM THE NECK DOWN.   Do not use on face/ open  Wound or open sores. Avoid contact with eyes, ears mouth and genitals (private parts).                       Wash face,  Genitals (private parts) with your normal soap.             6.  Wash thoroughly, paying special attention to the area where your surgery  will be performed.  7.  Thoroughly rinse your body with warm water from the neck down.  8.  DO NOT shower/wash with your normal soap after using and rinsing off  the CHG Soap.                9.  Pat yourself dry with a clean towel.            10.  Wear clean pajamas.            11.  Place clean sheets on your bed the night of your first shower and do not  sleep with pets. Day of Surgery : Do not apply any lotions/deodorants the morning of surgery.  Please wear clean clothes to the hospital/surgery center.  FAILURE TO FOLLOW THESE INSTRUCTIONS MAY RESULT IN THE CANCELLATION OF YOUR SURGERY PATIENT SIGNATURE_________________________________  NURSE SIGNATURE__________________________________  ________________________________________________________________________

## 2017-02-01 ENCOUNTER — Encounter (HOSPITAL_COMMUNITY): Payer: Self-pay

## 2017-02-01 ENCOUNTER — Encounter (HOSPITAL_COMMUNITY)
Admission: RE | Admit: 2017-02-01 | Discharge: 2017-02-01 | Disposition: A | Discharge status: Home / Self Care | Payer: Medicare HMO | Source: Ambulatory Visit | Attending: Urology | Admitting: Urology

## 2017-02-01 ENCOUNTER — Other Ambulatory Visit: Payer: Self-pay

## 2017-02-01 NOTE — Progress Notes (Signed)
Pt medical history taken over the phone due to inclement weather.

## 2017-02-03 MED ORDER — DEXTROSE 5 % IV SOLN
400.0000 mg | INTRAVENOUS | Status: AC
  Administered 2017-02-04: 400 mg via INTRAVENOUS
  Filled 2017-02-03: qty 10

## 2017-02-04 ENCOUNTER — Other Ambulatory Visit: Payer: Self-pay

## 2017-02-04 ENCOUNTER — Ambulatory Visit (HOSPITAL_COMMUNITY): Payer: Medicare HMO

## 2017-02-04 ENCOUNTER — Encounter (HOSPITAL_COMMUNITY): Payer: Self-pay | Admitting: Certified Registered"

## 2017-02-04 ENCOUNTER — Encounter (HOSPITAL_COMMUNITY)
Admission: RE | Disposition: A | Discharge status: Home / Self Care | Payer: Self-pay | Source: Ambulatory Visit | Attending: Urology

## 2017-02-04 ENCOUNTER — Observation Stay (HOSPITAL_COMMUNITY)
Admission: RE | Admit: 2017-02-04 | Discharge: 2017-02-05 | Disposition: A | Discharge status: Home / Self Care | Payer: Medicare HMO | Source: Ambulatory Visit | Attending: Urology | Admitting: Urology

## 2017-02-04 DIAGNOSIS — Z951 Presence of aortocoronary bypass graft: Secondary | ICD-10-CM | POA: Insufficient documentation

## 2017-02-04 DIAGNOSIS — I10 Essential (primary) hypertension: Secondary | ICD-10-CM | POA: Insufficient documentation

## 2017-02-04 DIAGNOSIS — C61 Malignant neoplasm of prostate: Secondary | ICD-10-CM | POA: Diagnosis not present

## 2017-02-04 DIAGNOSIS — Z884 Allergy status to anesthetic agent status: Secondary | ICD-10-CM | POA: Insufficient documentation

## 2017-02-04 DIAGNOSIS — R339 Retention of urine, unspecified: Secondary | ICD-10-CM | POA: Diagnosis present

## 2017-02-04 DIAGNOSIS — I25810 Atherosclerosis of coronary artery bypass graft(s) without angina pectoris: Secondary | ICD-10-CM | POA: Diagnosis not present

## 2017-02-04 DIAGNOSIS — N401 Enlarged prostate with lower urinary tract symptoms: Principal | ICD-10-CM | POA: Insufficient documentation

## 2017-02-04 DIAGNOSIS — R338 Other retention of urine: Secondary | ICD-10-CM | POA: Diagnosis not present

## 2017-02-04 DIAGNOSIS — I480 Paroxysmal atrial fibrillation: Secondary | ICD-10-CM | POA: Diagnosis not present

## 2017-02-04 DIAGNOSIS — K589 Irritable bowel syndrome without diarrhea: Secondary | ICD-10-CM | POA: Diagnosis not present

## 2017-02-04 DIAGNOSIS — Z8546 Personal history of malignant neoplasm of prostate: Secondary | ICD-10-CM | POA: Diagnosis not present

## 2017-02-04 DIAGNOSIS — E039 Hypothyroidism, unspecified: Secondary | ICD-10-CM | POA: Insufficient documentation

## 2017-02-04 DIAGNOSIS — I2510 Atherosclerotic heart disease of native coronary artery without angina pectoris: Secondary | ICD-10-CM | POA: Diagnosis not present

## 2017-02-04 HISTORY — PX: TRANSURETHRAL RESECTION OF PROSTATE: SHX73

## 2017-02-04 LAB — HEMOGLOBIN AND HEMATOCRIT, BLOOD
HEMATOCRIT: 35.8 % — AB (ref 39.0–52.0)
Hemoglobin: 11.7 g/dL — ABNORMAL LOW (ref 13.0–17.0)

## 2017-02-04 SURGERY — TURP (TRANSURETHRAL RESECTION OF PROSTATE)
Anesthesia: General

## 2017-02-04 MED ORDER — ACETAMINOPHEN 500 MG PO TABS
1000.0000 mg | ORAL_TABLET | Freq: Three times a day (TID) | ORAL | Status: DC
  Administered 2017-02-04 (×2): 1000 mg via ORAL
  Filled 2017-02-04 (×2): qty 2

## 2017-02-04 MED ORDER — PHENYLEPHRINE 40 MCG/ML (10ML) SYRINGE FOR IV PUSH (FOR BLOOD PRESSURE SUPPORT)
PREFILLED_SYRINGE | INTRAVENOUS | Status: DC | PRN
  Administered 2017-02-04 (×2): 80 ug via INTRAVENOUS

## 2017-02-04 MED ORDER — PROPOFOL 10 MG/ML IV BOLUS
INTRAVENOUS | Status: AC
  Filled 2017-02-04: qty 20

## 2017-02-04 MED ORDER — FENTANYL CITRATE (PF) 100 MCG/2ML IJ SOLN
INTRAMUSCULAR | Status: DC | PRN
  Administered 2017-02-04 (×2): 50 ug via INTRAVENOUS

## 2017-02-04 MED ORDER — ROCURONIUM BROMIDE 10 MG/ML (PF) SYRINGE
PREFILLED_SYRINGE | INTRAVENOUS | Status: DC | PRN
  Administered 2017-02-04: 4 mg via INTRAVENOUS

## 2017-02-04 MED ORDER — LACTATED RINGERS IV SOLN
INTRAVENOUS | Status: DC
  Administered 2017-02-04 (×2): via INTRAVENOUS

## 2017-02-04 MED ORDER — KCL IN DEXTROSE-NACL 20-5-0.45 MEQ/L-%-% IV SOLN
INTRAVENOUS | Status: DC
  Administered 2017-02-04: 15:00:00 via INTRAVENOUS
  Filled 2017-02-04 (×2): qty 1000

## 2017-02-04 MED ORDER — LEVOTHYROXINE SODIUM 50 MCG PO TABS
50.0000 ug | ORAL_TABLET | Freq: Every day | ORAL | Status: DC
  Administered 2017-02-05: 50 ug via ORAL
  Filled 2017-02-04: qty 1

## 2017-02-04 MED ORDER — NITROGLYCERIN 0.4 MG SL SUBL: 0.4000 mg | SUBLINGUAL_TABLET | SUBLINGUAL | Status: DC | PRN

## 2017-02-04 MED ORDER — FENTANYL CITRATE (PF) 100 MCG/2ML IJ SOLN
25.0000 ug | INTRAMUSCULAR | Status: DC | PRN
  Administered 2017-02-04: 25 ug via INTRAVENOUS

## 2017-02-04 MED ORDER — SENNOSIDES-DOCUSATE SODIUM 8.6-50 MG PO TABS: 2.0000 | ORAL_TABLET | Freq: Two times a day (BID) | ORAL | Status: DC

## 2017-02-04 MED ORDER — ATORVASTATIN CALCIUM 10 MG PO TABS
10.0000 mg | ORAL_TABLET | Freq: Every day | ORAL | Status: DC
  Administered 2017-02-04: 10 mg via ORAL
  Filled 2017-02-04: qty 1

## 2017-02-04 MED ORDER — SUCCINYLCHOLINE CHLORIDE 200 MG/10ML IV SOSY
PREFILLED_SYRINGE | INTRAVENOUS | Status: DC | PRN
  Administered 2017-02-04: 100 mg via INTRAVENOUS

## 2017-02-04 MED ORDER — FENTANYL CITRATE (PF) 100 MCG/2ML IJ SOLN
INTRAMUSCULAR | Status: AC
  Filled 2017-02-04: qty 2

## 2017-02-04 MED ORDER — MEPERIDINE HCL 50 MG/ML IJ SOLN: 6.2500 mg | INTRAMUSCULAR | Status: DC | PRN

## 2017-02-04 MED ORDER — DEXAMETHASONE SODIUM PHOSPHATE 10 MG/ML IJ SOLN
INTRAMUSCULAR | Status: DC | PRN
  Administered 2017-02-04: 5 mg via INTRAVENOUS

## 2017-02-04 MED ORDER — METOPROLOL TARTRATE 25 MG PO TABS
25.0000 mg | ORAL_TABLET | Freq: Two times a day (BID) | ORAL | Status: DC
  Administered 2017-02-04 – 2017-02-05 (×2): 25 mg via ORAL
  Filled 2017-02-04 (×2): qty 1

## 2017-02-04 MED ORDER — 0.9 % SODIUM CHLORIDE (POUR BTL) OPTIME
TOPICAL | Status: DC | PRN
  Administered 2017-02-04: 1000 mL

## 2017-02-04 MED ORDER — PROMETHAZINE HCL 25 MG/ML IJ SOLN: 6.2500 mg | INTRAMUSCULAR | Status: DC | PRN

## 2017-02-04 MED ORDER — TRAMADOL HCL 50 MG PO TABS: 50.0000 mg | ORAL_TABLET | Freq: Four times a day (QID) | ORAL | 0 refills | Status: DC | PRN

## 2017-02-04 MED ORDER — MIDAZOLAM HCL 2 MG/2ML IJ SOLN: 0.5000 mg | Freq: Once | INTRAMUSCULAR | Status: DC | PRN

## 2017-02-04 MED ORDER — SODIUM CHLORIDE 0.9 % IR SOLN
Status: DC | PRN
  Administered 2017-02-04: 15000 mL via INTRAVESICAL

## 2017-02-04 MED ORDER — TRAMADOL HCL 50 MG PO TABS
50.0000 mg | ORAL_TABLET | Freq: Four times a day (QID) | ORAL | Status: DC
  Filled 2017-02-04: qty 2

## 2017-02-04 MED ORDER — PROPOFOL 10 MG/ML IV BOLUS
INTRAVENOUS | Status: DC | PRN
  Administered 2017-02-04: 45 mg via INTRAVENOUS
  Administered 2017-02-04: 75 mg via INTRAVENOUS

## 2017-02-04 MED ORDER — PHENYLEPHRINE HCL 10 MG/ML IJ SOLN
INTRAVENOUS | Status: DC | PRN
  Administered 2017-02-04: 25 ug/min via INTRAVENOUS

## 2017-02-04 MED ORDER — FUROSEMIDE 20 MG PO TABS
20.0000 mg | ORAL_TABLET | Freq: Every day | ORAL | Status: DC
  Administered 2017-02-05: 20 mg via ORAL
  Filled 2017-02-04: qty 1

## 2017-02-04 MED ORDER — CEPHALEXIN 500 MG PO CAPS: 500.0000 mg | ORAL_CAPSULE | Freq: Two times a day (BID) | ORAL | 0 refills | Status: DC

## 2017-02-04 MED ORDER — LIDOCAINE 2% (20 MG/ML) 5 ML SYRINGE
INTRAMUSCULAR | Status: DC | PRN
  Administered 2017-02-04: 20 mg via INTRAVENOUS

## 2017-02-04 SURGICAL SUPPLY — 19 items
BAG URINE DRAINAGE (UROLOGICAL SUPPLIES) ×2 IMPLANT
BAG URO CATCHER STRL LF (MISCELLANEOUS) ×2 IMPLANT
CATH FOLEY 3WAY 30CC 24FR (CATHETERS)
CATH HEMA 3WAY 30CC 22FR COUDE (CATHETERS) ×2 IMPLANT
CATH URTH STD 24FR FL 3W 2 (CATHETERS) IMPLANT
COVER FOOTSWITCH UNIV (MISCELLANEOUS) ×2 IMPLANT
COVER SURGICAL LIGHT HANDLE (MISCELLANEOUS) IMPLANT
ELECT REM PT RETURN 15FT ADLT (MISCELLANEOUS) IMPLANT
GAUZE SPONGE 4X4 12PLY STRL (GAUZE/BANDAGES/DRESSINGS) ×2 IMPLANT
GLOVE BIOGEL M STRL SZ7.5 (GLOVE) ×12 IMPLANT
GOWN STRL REUS W/TWL LRG LVL3 (GOWN DISPOSABLE) ×4 IMPLANT
GOWN STRL REUS W/TWL XL LVL3 (GOWN DISPOSABLE) ×2 IMPLANT
HOLDER FOLEY CATH W/STRAP (MISCELLANEOUS) ×2 IMPLANT
LOOP CUT BIPOLAR 24F LRG (ELECTROSURGICAL) ×2 IMPLANT
MANIFOLD NEPTUNE II (INSTRUMENTS) ×2 IMPLANT
PACK CYSTO (CUSTOM PROCEDURE TRAY) ×2 IMPLANT
SYRINGE IRR TOOMEY STRL 70CC (SYRINGE) ×2 IMPLANT
TUBING CONNECTING 10 (TUBING) ×2 IMPLANT
WATER STERILE IRR 500ML POUR (IV SOLUTION) ×2 IMPLANT

## 2017-02-04 NOTE — Plan of Care (Signed)
Pt s/p TURP.  A/O x4.  Denies pain at rest but c/o pain to penis with movement or touch.  CBI going in slow, dark pink urine output. Lungs CTAB. Family at bedside.  Will continue to monitor and continue with current plan of care.

## 2017-02-04 NOTE — Anesthesia Preprocedure Evaluation (Addendum)
Anesthesia Evaluation  Patient identified by MRN, date of birth, ID band Patient awake    Reviewed: Allergy & Precautions, NPO status , Patient's Chart, lab work & pertinent test results  History of Anesthesia Complications (+) Emergence Delirium  Airway Mallampati: III  TM Distance: <3 FB Neck ROM: Full    Dental  (+) Dental Advisory Given, Caps   Pulmonary neg pulmonary ROS,    breath sounds clear to auscultation       Cardiovascular hypertension, Pt. on medications and Pt. on home beta blockers (-) angina+ CAD and + CABG  + dysrhythmias (s/p Maze) Atrial Fibrillation + Valvular Problems/Murmurs (s/p MV repair)  Rhythm:Regular Rate:Normal  '15 ECHO: EF 55-60%, LVH, mild MS s/p MV repair   Neuro/Psych negative neurological ROS     GI/Hepatic negative GI ROS, Neg liver ROS,   Endo/Other  Hypothyroidism   Renal/GU Renal InsufficiencyRenal disease (creat 1.53)     Musculoskeletal   Abdominal   Peds  Hematology negative hematology ROS (+)   Anesthesia Other Findings   Reproductive/Obstetrics                            Anesthesia Physical Anesthesia Plan  ASA: III  Anesthesia Plan: General   Post-op Pain Management:    Induction: Intravenous  PONV Risk Score and Plan: 2 and Ondansetron, Dexamethasone and Treatment may vary due to age or medical condition  Airway Management Planned: LMA  Additional Equipment:   Intra-op Plan:   Post-operative Plan:   Informed Consent: I have reviewed the patients History and Physical, chart, labs and discussed the procedure including the risks, benefits and alternatives for the proposed anesthesia with the patient or authorized representative who has indicated his/her understanding and acceptance.   Dental advisory given  Plan Discussed with: CRNA and Surgeon  Anesthesia Plan Comments: (Plan routine monitors, GA- LMA OK)         Anesthesia Quick Evaluation

## 2017-02-04 NOTE — Brief Op Note (Signed)
02/04/2017  12:02 PM  PATIENT:  [name].  83 y.o. male  PRE-OPERATIVE DIAGNOSIS:  PROSTATIC HYPERTROPHY WITH URINARY RETENTION  POST-OPERATIVE DIAGNOSIS:  PROSTATIC HYPERTROPHY WITH URINARY RETENTION  PROCEDURE:  Procedure(s): TRANSURETHRAL RESECTION OF THE PROSTATE (TURP) (N/A)  SURGEON:  Surgeon(s) and Role:    Alexis Frock, MD - Primary  PHYSICIAN ASSISTANT:   ASSISTANTS: none   ANESTHESIA:   general  EBL:  157mL  BLOOD ADMINISTERED:none  DRAINS: none   LOCAL MEDICATIONS USED:  NONE  SPECIMEN:  Source of Specimen:  prostate chips, history of prostate cancer  DISPOSITION OF SPECIMEN:  PATHOLOGY  COUNTS:  YES  TOURNIQUET:  * No tourniquets in log *  DICTATION: .Other Dictation: Dictation Number [id number]  PLAN OF CARE: Admit to inpatient   PATIENT DISPOSITION:  PACU - hemodynamically stable.   Delay start of Pharmacological VTE agent (>24hrs) due to surgical blood loss or risk of bleeding: yes

## 2017-02-04 NOTE — H&P (Signed)
[name]. is an 83 y.o. male.    Chief Complaint: Pre-op Transurethral Resection of Prostate  HPI:   1 - High-Risk Prostate Cancer In Elderly Man With Significant Comorbidity - long h/o elvated PSA's, now high risk prostate cancer on palliative / surveillance management with consider intervention for symptomatic / metastatic disease only.    Recent Summarized Course:   2010- Gleason 7 LLM, Gleason 6 LMM BX by [facility] ==> palliative / surveillance management   2014 PSA 20   2017 PSA 20.02 / DRE 70gm Left side difusely nodular (T2B exam) => start finasteride   2018 PSA 11.43 / DRE 70gm stable Lt nodules (on finasteride)    2 - Urinary Urgency / Large Prostate / Overflow Inctoninence - pt with progressive bother from mix of irritative and obstructive symptoms. DRE 70gm. PVR 2017 "114mL" (normal for age). Improved on finasteride daily and lasix early afternoon to reduce nocturia. Progressed to overflow incontinence 12/2016 with PVR's >1L. Cysto with mostly Rt lobe firable prostatic hypertrophy, no strictures, catheter placed.    PMH sig for CAD/CABGx2/MAZE/MVR (no strong blood thinners, follows [facility]), Frailty / falls (negative head CT 2017). His PCP is Alysia Penna MD with [facility].    Today "Fritz Pickerel " is seen to proceed with transurethral resection of prostate for goal of eventual catheter free. Most recent UCX negative.     Past Medical History:  Diagnosis Date  . Complication of anesthesia    "serious cognisance problems since my OR in January" (03/13/2013  . Coronary artery disease   . Hx of echocardiogram    Echo 5/16:  Mild focal basal septal hypertrophy, EF 55%, mild AI, MV repair ok with trivial MR (mean 3 mmHg), mild LAE, mild RAE, PASP 35 mmHg  . Hypertension   . Irritable bowel syndrome   . PAF (paroxysmal atrial fibrillation) ([place]) 05/03/2014  . Personal history of colonic polyps    tubular adenoma  . Pleural effusion, bilateral 03/12/2013   Small  to moderate, L>R  . Prostate cancer [facility]) 1991   sees Dr. Carlota Raspberry at [facility], observation only   . S/P CABG x 2 02/28/2013   LIMA to LAD, SVG to OM1, EVH via right thigh  . S/P Maze operation for atrial fibrillation 02/28/2013   Complete bilateral atrial lesion set using bipolar radiofrequency and cryothermy ablation with clipping of LA appendage  . S/P mitral valve repair, maze procedure, and CABG x2 02/28/2013   Complex valvuloplasty including triangular resection of posterior leaflet, 30 mm Sorin Memo 3D ring annuloplasty    Past Surgical History:  Procedure Laterality Date  . CARDIAC CATHETERIZATION  2015  . CATARACT EXTRACTION, BILATERAL Bilateral 2014   per Dr. Gershon Crane   . COLONOSCOPY  2011   per Dr. Sharlett Iles, benign polyps, no repeats needed   . CORONARY ARTERY BYPASS GRAFT N/A 02/28/2013   Procedure: CORONARY ARTERY BYPASS GRAFTING (CABG);  Surgeon: Rexene Alberts, MD;  Location: [place];  Service: Open Heart Surgery;  Laterality: N/A;  CABG x 2,  using left internal mammary artery and right leg saphenous vein harvested endoscopically  . HEMORRHOID SURGERY    . INTRAOPERATIVE TRANSESOPHAGEAL ECHOCARDIOGRAM N/A 02/28/2013   Procedure: INTRAOPERATIVE TRANSESOPHAGEAL ECHOCARDIOGRAM;  Surgeon: Rexene Alberts, MD;  Location: [place];  Service: Open Heart Surgery;  Laterality: N/A;  . LEFT HEART CATHETERIZATION WITH CORONARY ANGIOGRAM N/A 02/26/2013   Procedure: LEFT HEART CATHETERIZATION WITH CORONARY ANGIOGRAM;  Surgeon: Leonie Man, MD;  Location: [facility] CATH  LAB;  Service: Cardiovascular;  Laterality: N/A;  . MAZE N/A 02/28/2013   Procedure: MAZE;  Surgeon: Rexene Alberts, MD;  Location: [place];  Service: Open Heart Surgery;  Laterality: N/A;  . MITRAL VALVE REPAIR N/A 02/28/2013   Procedure: MITRAL VALVE REPAIR (MVR);  Surgeon: Rexene Alberts, MD;  Location: [place];  Service: Open Heart Surgery;  Laterality: N/A;  . PROSTATE BIOPSY      Family History  Problem Relation Age of Onset  .  Hypertension Mother   . Sudden death Mother   . Hypertension Father   . Sudden death Father   . Heart disease Son   . Diabetes Unknown   . Hypertension Unknown   . Sudden death Unknown   . Heart disease Unknown   . Stroke Unknown   . Lung cancer Brother   . Stomach cancer Brother   . Cancer Daughter   . Cancer Brother   . Diabetes Sister    Social History:  reports that  has never smoked. he has never used smokeless tobacco. He reports that he does not drink alcohol or use drugs.  Allergies:  Allergies  Allergen Reactions  . Other Other (See Comments)    Anesthesia---per pt this makes him "bonkers" OR     No medications prior to admission.    No results found for this or any previous visit (from the past 48 hour(s)). No results found.  Review of Systems  Constitutional: Positive for weight loss. Negative for chills and fever.  HENT: Negative.   Eyes: Negative.   Respiratory: Negative.   Cardiovascular: Negative.   Gastrointestinal: Negative.   Genitourinary: Negative for flank pain and hematuria.  Musculoskeletal: Negative.   Skin: Negative.   Neurological: Negative.   Endo/Heme/Allergies: Negative.   Psychiatric/Behavioral: Negative.     There were no vitals taken for this visit. Physical Exam  Constitutional: He appears well-developed.  Elderly, at baseline.   HENT:  Head: Normocephalic.  Eyes: Pupils are equal, round, and reactive to light.  Neck: Normal range of motion.  Cardiovascular: Normal rate.  Respiratory: Effort normal.  GI: Soft.  Genitourinary:  Genitourinary Comments: Foley in place with yellow urine.   Neurological: He is alert.  Skin: Skin is warm.  Psychiatric: He has a normal mood and affect.     Assessment/Plan  Proceed as planned with TURP. Goal is eventual catheter free / symptom palliation. Risks, benefits, alternatives, expected peri-op course with overnight observation and home with catheter discussed previously and reiterated  today.   Alexis Frock, MD 02/04/2017, 6:28 AM

## 2017-02-04 NOTE — Anesthesia Procedure Notes (Signed)
Procedure Name: Intubation Date/Time: 02/04/2017 11:03 AM Performed by: Annye Asa, MD Pre-anesthesia Checklist: Patient identified, Emergency Drugs available, Suction available, Patient being monitored and Timeout performed Patient Re-evaluated:Patient Re-evaluated prior to induction Oxygen Delivery Method: Circle system utilized Preoxygenation: Pre-oxygenation with 100% oxygen Induction Type: IV induction Ventilation: Mask ventilation without difficulty Grade View: Grade I Tube type: Oral Tube size: 7.0 mm Number of attempts: 1 Airway Equipment and Method: Video-laryngoscopy Placement Confirmation: positive ETCO2 and breath sounds checked- equal and bilateral Secured at: 22 cm Tube secured with: Tape Dental Injury: Teeth and Oropharynx as per pre-operative assessment  Comments: Smooth IV induction Glennon Mac--- LMA attempted by SRNA-- pt light with hypotension--- Neo given by Alvan Dame elected to intubate--- Glidescope by SRNA-- assisted and supervised by [facility]--  Atraumatic--- teeth and mouth as preop--- bilat BS Glennon Mac

## 2017-02-04 NOTE — Anesthesia Postprocedure Evaluation (Signed)
Anesthesia Post Note  Patient: [name].  Procedure(s) Performed: TRANSURETHRAL RESECTION OF THE PROSTATE (TURP) (N/A )     Patient location during evaluation: PACU Anesthesia Type: General Level of consciousness: awake and alert, oriented and patient cooperative (pt's mentation unchanged from pre-op) Pain management: pain level controlled Vital Signs Assessment: post-procedure vital signs reviewed and stable Respiratory status: spontaneous breathing, nonlabored ventilation, respiratory function stable and patient connected to nasal cannula oxygen Cardiovascular status: blood pressure returned to baseline and stable Postop Assessment: no apparent nausea or vomiting Anesthetic complications: no    Last Vitals:  Vitals:   02/04/17 1330 02/04/17 1345  BP: 122/69 (!) 144/63  Pulse: 76 78  Resp: 19 12  Temp:  36.7 C  SpO2: 100% 100%    Last Pain:  Vitals:   02/04/17 1225  TempSrc:   PainSc: 3                  Jaslin Novitski,E. Trenisha Lafavor

## 2017-02-04 NOTE — Anesthesia Procedure Notes (Signed)
Date/Time: 02/04/2017 12:05 PM Performed by: Cynda Familia, CRNA Pre-anesthesia Checklist: Patient identified, Emergency Drugs available, Suction available and Patient being monitored Oxygen Delivery Method: Simple face mask Placement Confirmation: positive ETCO2 and breath sounds checked- equal and bilateral Dental Injury: Teeth and Oropharynx as per pre-operative assessment  Comments: Simple face mask--- extubated --- good AW

## 2017-02-04 NOTE — Transfer of Care (Signed)
Immediate Anesthesia Transfer of Care Note  Patient: [name].  Procedure(s) Performed: TRANSURETHRAL RESECTION OF THE PROSTATE (TURP) (N/A )  Patient Location: PACU  Anesthesia Type:General  Level of Consciousness: awake and alert   Airway & Oxygen Therapy: Patient Spontanous Breathing and Patient connected to face mask oxygen  Post-op Assessment: Report given to RN and Post -op Vital signs reviewed and stable  Post vital signs: Reviewed and stable  Last Vitals:  Vitals:   02/04/17 0840  BP: 126/73  Pulse: 83  Resp: 16  Temp: 37.1 C  SpO2: 98%    Last Pain:  Vitals:   02/04/17 0840  TempSrc: Oral      Patients Stated Pain Goal: 3 (12/75/17 0017)  Complications: No apparent anesthesia complications

## 2017-02-04 NOTE — Discharge Instructions (Signed)
1 - You may have urinary urgency (bladder spasms) and bloody urine on / off for up to 2 weeks.  This is normal.  2 - Call MD or go to ER for fever >102, severe pain / nausea / vomiting not relieved by medications, or acute change in medical status  

## 2017-02-05 ENCOUNTER — Encounter (HOSPITAL_COMMUNITY): Payer: Self-pay | Admitting: Urology

## 2017-02-05 DIAGNOSIS — N401 Enlarged prostate with lower urinary tract symptoms: Secondary | ICD-10-CM | POA: Diagnosis not present

## 2017-02-05 LAB — HEMOGLOBIN AND HEMATOCRIT, BLOOD
HCT: 33 % — ABNORMAL LOW (ref 39.0–52.0)
HEMOGLOBIN: 11.1 g/dL — AB (ref 13.0–17.0)

## 2017-02-05 NOTE — Discharge Summary (Signed)
Physician Discharge Summary  Patient ID: [name]. MRN: [id number] DOB/AGE: 81-Apr-1929 81 y.o.  Admit date: 02/04/2017 Discharge date: 02/05/2017  Admission Diagnoses:  Discharge Diagnoses:  Active Problems:   Urinary retention   Discharged Condition: good  Hospital Course: underwent TURP yesterday. CBI weaned this am. Ambulating. Urine light pink. Hgb stable  Consults: none  Significant Diagnostic Studies: none  Treatments: surgery: TURP  Discharge Exam: Blood pressure 127/70, pulse 70, temperature 98.4 F (36.9 C), temperature source Oral, resp. rate 16, height 5\' 9"  (1.753 m), weight 78 kg (172 lb), SpO2 99 %. General appearance: alert  NAD Adequate perfusion  NLR abd soft NTND Foley light pink output  Disposition: 01-Home or Self Care   Allergies as of 02/05/2017      Reactions   Other Other (See Comments)   Anesthesia---per pt this makes him "bonkers" OR       Medication List    STOP taking these medications   aspirin EC 325 MG tablet   mirabegron ER 25 MG Tb24 tablet Commonly known as:  MYRBETRIQ     TAKE these medications   amoxicillin 500 MG capsule Commonly known as:  AMOXIL TAKE 4 CAPSULES BY MOUTH 1 HOUR prior TO dental appointment.   atorvastatin 10 MG tablet Commonly known as:  LIPITOR TAKE 1 TABLET BY MOUTH EVERY DAY AT 6:00 PM   cephALEXin 500 MG capsule Commonly known as:  KEFLEX Take 1 capsule (500 mg total) by mouth 2 (two) times daily. X 5 days to prevent post-op infection with catheter in place.   fish oil-omega-3 fatty acids 1000 MG capsule Take 1 g by mouth every morning.   furosemide 20 MG tablet Commonly known as:  LASIX Take 1 tablet (20 mg total) by mouth daily.   levothyroxine 50 MCG tablet Commonly known as:  SYNTHROID, LEVOTHROID Take 1 tablet (50 mcg total) by mouth every morning.   meclizine 25 MG tablet Commonly known as:  ANTIVERT Take 1 tablet (25 mg total) by mouth 3 (three) times daily as  needed.   metoprolol tartrate 25 MG tablet Commonly known as:  LOPRESSOR Take 1 tablet (25 mg total) by mouth 2 (two) times daily.   nitroGLYCERIN 0.4 MG SL tablet Commonly known as:  NITROSTAT Place 1 tablet (0.4 mg total) under the tongue every 5 (five) minutes as needed for chest pain.   traMADol 50 MG tablet Commonly known as:  ULTRAM Take 1-2 tablets (50-100 mg total) by mouth every 6 (six) hours as needed. For post-op pain   vitamin C 500 MG tablet Commonly known as:  ASCORBIC ACID Take 500 mg by mouth every morning.   VITAMIN D3 PO Take 2,000 Units by mouth daily.      Follow-up Information    Alexis Frock, MD On 02/10/2017.   Specialty:  Urology Why:  at 10:15 for MD visit and office catheter removal Contact information: [place] [address] [phone]           Signed: Marton Redwood, III 02/05/2017, 9:15 AM

## 2017-02-07 DIAGNOSIS — R338 Other retention of urine: Secondary | ICD-10-CM | POA: Diagnosis not present

## 2017-02-07 NOTE — Op Note (Signed)
NAME:  [name], [name] NO.:  MEDICAL RECORD NO.:  [id number]  LOCATION:                                 FACILITY:  PHYSICIAN:  Alexis Frock, MD          DATE OF BIRTH:  DATE OF PROCEDURE:  02/04/2017                              OPERATIVE REPORT   DIAGNOSIS:  Known, locally-advanced prostate cancer, now with urinary retention.  PROCEDURE:  Transurethral resection of the prostate.  ESTIMATED BLOOD LOSS:  150 mL.  COMPLICATION:  None.  SPECIMEN: 1. Prostate chips for permanent pathology. 2. History of prostate cancer.  FINDINGS: 1. Bilobar large prostatic hypertrophy. 2. Abnormal friable prostate tissue likely consistent with known     prostate cancer. 3. Otherwise, unremarkable urinary bladder. 4. Wide-open channel from the area of the bladder neck to the     verumontanum following transurethral resection.  DRAINS:  A 22-French three-way hematuria Foley catheter with 30 mL sterile water in the balloon to normal saline irrigation.  Efflux light pink.  INDICATION:  [name] is a pleasant 83 year old gentleman with a long history of known, locally-advanced adenocarcinoma of the prostate.  He has been managed on a palliative-intent protocol with intermittent androgen deprivation for years.  He has unfortunately developed new urinary retention within the past several months.  He had postvoid residuals last year that were normal.  Cystoscopy revealed significant bilobar prostatic hypertrophy.  No other signs of obstruction.  Options were discussed including chronic indwelling catheters versus trial of outlet procedure with transurethral resection with goal of catheter- free, and he wished to proceed.  Informed consent was obtained and placed in the medical record.  PROCEDURE IN DETAIL:  The patient being [name], was verified. Procedure being transurethral resection of prostate was confirmed. Procedure was carried out.  Time-out was  performed.  Intravenous antibiotics were administered.  General endotracheal anesthesia was introduced.  The patient was placed into a low-lithotomy position. Sterile field was created by prepping and draping the patient's penis, perineum, and proximal thighs using iodine.  Next, cystourethroscopy was performed using a 26-French resectoscope sheath with visual obturator. Inspection of the anterior and posterior urethra revealed large bilobar prostatic hypertrophy.  The prostate was very abnormal in appearance with significant friable tissues consistent with known cancer.  Bladder was otherwise unremarkable.  There were mild trabeculations.  Next, using a resectoscope loop, careful transurethral resection was performed from the area of the bladder neck to the area of the verumontanum in a top-down approach, first at the 12 o'clock position, then on the right lobe of the prostate, and then on the left lobe of the prostate with the goal of performing a wide-open channel, but not resecting to visualization of the prostatic capsule.  Approximately, 40 to 50 grams of tissue was removed with this technique.  Following this, there was excellent channel from the area of the bladder neck to the area of verumontanum with no evidence of bladder perforation.  Prostate chips were irrigated and set aside for permanent pathology.  The base of the resection was fulgurated using coagulation current.  The resectoscope was then exchanged for a new  22-French 3-way Foley catheter with 30 mL of sterile water in the balloon with normal saline irrigation, efflux light pink.  Procedure was then terminated.  The patient tolerated the procedure well.  No immediate periprocedural complications.  The patient was taken to the postanesthesia care unit in a stable condition with a plan for observation, admission, and likely discharge home tomorrow with catheter in place.           ______________________________ Alexis Frock, MD     TM/MEDQ  D:  02/04/2017  T:  02/05/2017  Job:  [id number]

## 2017-02-18 NOTE — Addendum Note (Signed)
Addendum  created 02/18/17 9485 by Cynda Familia, CRNA   Cosign clinical note

## 2017-02-28 ENCOUNTER — Ambulatory Visit (INDEPENDENT_AMBULATORY_CARE_PROVIDER_SITE_OTHER): Payer: Medicare HMO | Admitting: Orthopedic Surgery

## 2017-02-28 ENCOUNTER — Ambulatory Visit (INDEPENDENT_AMBULATORY_CARE_PROVIDER_SITE_OTHER): Payer: Medicare HMO

## 2017-02-28 ENCOUNTER — Encounter (INDEPENDENT_AMBULATORY_CARE_PROVIDER_SITE_OTHER): Payer: Self-pay | Admitting: Orthopedic Surgery

## 2017-02-28 DIAGNOSIS — M25562 Pain in left knee: Secondary | ICD-10-CM

## 2017-03-01 DIAGNOSIS — C61 Malignant neoplasm of prostate: Secondary | ICD-10-CM | POA: Diagnosis not present

## 2017-03-01 DIAGNOSIS — R3914 Feeling of incomplete bladder emptying: Secondary | ICD-10-CM | POA: Diagnosis not present

## 2017-03-03 ENCOUNTER — Other Ambulatory Visit (INDEPENDENT_AMBULATORY_CARE_PROVIDER_SITE_OTHER): Payer: Medicare HMO

## 2017-03-03 DIAGNOSIS — R7989 Other specified abnormal findings of blood chemistry: Secondary | ICD-10-CM

## 2017-03-03 LAB — BASIC METABOLIC PANEL
BUN: 30 mg/dL — AB (ref 6–23)
CO2: 27 mEq/L (ref 19–32)
Calcium: 8.5 mg/dL (ref 8.4–10.5)
Chloride: 106 mEq/L (ref 96–112)
Creatinine, Ser: 1.62 mg/dL — ABNORMAL HIGH (ref 0.40–1.50)
GFR: 43.01 mL/min — ABNORMAL LOW (ref >60.00)
Glucose, Bld: 127 mg/dL — ABNORMAL HIGH (ref 70–99)
Potassium: 4.4 mEq/L (ref 3.5–5.1)
Sodium: 140 mEq/L (ref 135–145)

## 2017-03-03 NOTE — Progress Notes (Signed)
Office Visit Note   Patient: [name].           Date of Birth: 02-02-1928           MRN: [id number] Visit Date: 02/28/2017 Requested by: [name], MD [place],  [id number] PCP: [name], MD  Subjective: Chief Complaint  Patient presents with  . Left Knee - Pain    HPI: Odai is a 84 year old patient with left knee pain.  Or edema down on the left knee and when he got up he had immediate pain.  He reports swelling which is improved.  He denies any locking or popping.  He states that the fluid comes and goes in his knee.  Does not have a history of gout or pseudogout.  Currently he can live with his current level of symptoms.              ROS: All systems reviewed are negative as they relate to the chief complaint within the history of present illness.  Patient denies  fevers or chills.   Assessment & Plan: Visit Diagnoses:  1. Left knee pain, unspecified chronicity     Plan: Impression is left knee pain with mild effusion with normal exam and radiographs.  This is improving on its own.  We discussed aspiration and injection.  We will hold off on that intervention for now.  Placed in Ace wrap for compression over the knee continue with anti-inflammatories as needed.  Follow-up with me as needed.  Follow-Up Instructions: Return if symptoms worsen or fail to improve.   Orders:  Orders Placed This Encounter  Procedures  . XR KNEE 3 VIEW LEFT   No orders of the defined types were placed in this encounter.     Procedures: No procedures performed   Clinical Data: No additional findings.  Objective: Vital Signs: There were no vitals taken for this visit.  Physical Exam:   Constitutional: Patient appears well-developed HEENT:  Head: Normocephalic Eyes:EOM are normal Neck: Normal range of motion Cardiovascular: Normal rate Pulmonary/chest: Effort normal Neurologic: Patient is alert Skin: Skin is warm Psychiatric:  Patient has normal mood and affect    Ortho Exam: Orthopedic exam demonstrates normal gait and alignment mild effusion left knee no effusion right knee collateral and cruciate ligaments are stable in the left.  Pulses palpable.  No groin pain with internal/external rotation of the leg.  No focal joint line tenderness.  No other masses lymphadenopathy or skin changes noted in the left knee region.  No bruising.  Specialty Comments:  No specialty comments available.  Imaging: No results found.   PMFS History: Patient Active Problem List   Diagnosis Date Noted  . Urinary retention 02/04/2017  . PAF (paroxysmal atrial fibrillation), CHA2DS2VASc score 4 05/03/2014  . Dizziness 05/03/2014  . Encounter for therapeutic drug monitoring 05/07/2013  . S/P CABG x 2 03/26/2013  . S/P MVR (mitral valve repair) 03/26/2013  . S/P Maze operation for atrial fibrillation 03/26/2013  . Current use of long term anticoagulation 03/16/2013  . Chronic diastolic CHF (congestive heart failure) ([place]) 03/12/2013  . Anemia- post op 03/12/2013  . Pre-syncope 03/12/2013  . Hypotension, unspecified 03/12/2013  . Pleural effusion, bilateral 03/12/2013  . Delirium 03/12/2013  . CAD (coronary artery disease) 03/06/2013  . S/P mitral valve repair, maze procedure, and CABG x2 02/29/12 02/28/2013  . Unstable angina ([place]) 02/26/2013  . Paroxysmal atrial fibrillation ([place]) 01/10/2011  . Special screening for malignant  neoplasms, colon 10/05/2010  . Benign neoplasm of colon 10/05/2010  . Diverticulosis of colon (without mention of hemorrhage) 10/05/2010  . ADENOCARCINOMA, PROSTATE, GLEASON GRADE 3 08/13/2009  . SEBACEOUS CYST 08/13/2009  . Hypothyroidism 08/02/2007  . GERD 08/02/2007  . OSTEOPENIA 08/02/2007  . UNS ADVRS EFF OTH RX MEDICINAL&BIOLOGICAL SBSTNC 08/02/2007  . HLD (hyperlipidemia) 12/15/2006  . RHINITIS, CHRONIC 12/15/2006  . PROSTATE SPECIFIC ANTIGEN, ELEVATED 12/15/2006  . Essential hypertension  09/05/2006  . ARTHRITIS 09/05/2006  . COLONIC POLYPS, HX OF 09/05/2006   Past Medical History:  Diagnosis Date  . Complication of anesthesia    "serious cognisance problems since my OR in January" (03/13/2013  . Coronary artery disease   . Hx of echocardiogram    Echo 5/16:  Mild focal basal septal hypertrophy, EF 55%, mild AI, MV repair ok with trivial MR (mean 3 mmHg), mild LAE, mild RAE, PASP 35 mmHg  . Hypertension   . Irritable bowel syndrome   . PAF (paroxysmal atrial fibrillation) ([place]) 05/03/2014  . Personal history of colonic polyps    tubular adenoma  . Pleural effusion, bilateral 03/12/2013   Small to moderate, L>R  . Prostate cancer [facility]) 1991   sees Dr. Carlota Raspberry at [facility], observation only   . S/P CABG x 2 02/28/2013   LIMA to LAD, SVG to OM1, EVH via right thigh  . S/P Maze operation for atrial fibrillation 02/28/2013   Complete bilateral atrial lesion set using bipolar radiofrequency and cryothermy ablation with clipping of LA appendage  . S/P mitral valve repair, maze procedure, and CABG x2 02/28/2013   Complex valvuloplasty including triangular resection of posterior leaflet, 30 mm Sorin Memo 3D ring annuloplasty    Family History  Problem Relation Age of Onset  . Hypertension Mother   . Sudden death Mother   . Hypertension Father   . Sudden death Father   . Heart disease Son   . Diabetes Unknown   . Hypertension Unknown   . Sudden death Unknown   . Heart disease Unknown   . Stroke Unknown   . Lung cancer Brother   . Stomach cancer Brother   . Cancer Daughter   . Cancer Brother   . Diabetes Sister     Past Surgical History:  Procedure Laterality Date  . CARDIAC CATHETERIZATION  2015  . CATARACT EXTRACTION, BILATERAL Bilateral 2014   per Dr. Gershon Crane   . COLONOSCOPY  2011   per Dr. Sharlett Iles, benign polyps, no repeats needed   . CORONARY ARTERY BYPASS GRAFT N/A 02/28/2013   Procedure: CORONARY ARTERY BYPASS GRAFTING (CABG);  Surgeon: Rexene Alberts, MD;   Location: [facility];  Service: Open Heart Surgery;  Laterality: N/A;  CABG x 2,  using left internal mammary artery and right leg saphenous vein harvested endoscopically  . HEMORRHOID SURGERY    . INTRAOPERATIVE TRANSESOPHAGEAL ECHOCARDIOGRAM N/A 02/28/2013   Procedure: INTRAOPERATIVE TRANSESOPHAGEAL ECHOCARDIOGRAM;  Surgeon: Rexene Alberts, MD;  Location: [facility];  Service: Open Heart Surgery;  Laterality: N/A;  . LEFT HEART CATHETERIZATION WITH CORONARY ANGIOGRAM N/A 02/26/2013   Procedure: LEFT HEART CATHETERIZATION WITH CORONARY ANGIOGRAM;  Surgeon: Leonie Man, MD;  Location: [facility] CATH LAB;  Service: Cardiovascular;  Laterality: N/A;  . MAZE N/A 02/28/2013   Procedure: MAZE;  Surgeon: Rexene Alberts, MD;  Location: [place];  Service: Open Heart Surgery;  Laterality: N/A;  . MITRAL VALVE REPAIR N/A 02/28/2013   Procedure: MITRAL VALVE REPAIR (MVR);  Surgeon: Rexene Alberts, MD;  Location: [facility];  Service: Open Heart Surgery;  Laterality: N/A;  . PROSTATE BIOPSY    . TRANSURETHRAL RESECTION OF PROSTATE N/A 02/04/2017   Procedure: TRANSURETHRAL RESECTION OF THE PROSTATE (TURP);  Surgeon: Alexis Frock, MD;  Location: [facility];  Service: Urology;  Laterality: N/A;   Social History   Occupational History  . Occupation: Retired  Tobacco Use  . Smoking status: Never Smoker  . Smokeless tobacco: Never Used  Substance and Sexual Activity  . Alcohol use: No    Alcohol/week: 0.0 oz  . Drug use: No  . Sexual activity: No

## 2017-03-15 DIAGNOSIS — R310 Gross hematuria: Secondary | ICD-10-CM | POA: Diagnosis not present

## 2017-04-26 ENCOUNTER — Other Ambulatory Visit: Payer: Self-pay | Admitting: Urology

## 2017-04-26 DIAGNOSIS — R310 Gross hematuria: Secondary | ICD-10-CM | POA: Diagnosis not present

## 2017-04-26 DIAGNOSIS — C61 Malignant neoplasm of prostate: Secondary | ICD-10-CM

## 2017-04-26 DIAGNOSIS — N3945 Continuous leakage: Secondary | ICD-10-CM | POA: Diagnosis not present

## 2017-04-29 ENCOUNTER — Telehealth: Payer: Self-pay | Admitting: Family Medicine

## 2017-04-29 DIAGNOSIS — C61 Malignant neoplasm of prostate: Secondary | ICD-10-CM

## 2017-04-29 NOTE — Telephone Encounter (Signed)
Copied from [place] [phone]. Topic: Quick Communication - See Telephone Encounter >> Apr 29, 2017 10:48 AM Cleaster Corin, NT wrote: CRM for notification. See Telephone encounter for:   04/29/17. Pt. Requesting a callback from Dr. Sarajane Jews or nurse concerning a referral from his urinologist about doing a hormone treatment for his prostate cancer  pt. Would like for someone to give him a call [phone]

## 2017-04-29 NOTE — Telephone Encounter (Signed)
Pt stated that Dr. Tammi Klippel has recommend the pt  under go hormone treatment since his prostate cancer is apparently a little more active. Pt isn't for doing the surgery. Pt called his insurance [employer] they suggested for the pt to get a second opinon from an oncologist doctor. Pt wanted to know what Dr. Sarajane Jews thought and if he feels it is a good idea who would he recomand for his to see oncologist wise because all they doctor's he knew of have passed or are retired.  Call back at [phone]  Sent to PCP to advise

## 2017-04-29 NOTE — Telephone Encounter (Signed)
I did a referral to see Dr. Lurline Del

## 2017-04-29 NOTE — Telephone Encounter (Signed)
Called and spoke with pt. Pt advised and voiced understanding.  

## 2017-05-06 NOTE — Telephone Encounter (Signed)
So it looks like the orders were placed but they were not sent to [place] [facility] orders were sent to [facility] might have to re-do the orders and make sure to send to [place] otherwise Debarah can't do anything. She tried to switch it to her but was unable to.   Sent to PCP   Called and spoke with pt to make him aware of the situation. Pt advised and voiced understanding. Pt would like for this to be sent in as urgent if possible.

## 2017-05-06 NOTE — Telephone Encounter (Signed)
Patient said that he was told that he would hear from Dr Magrinat's office and he has not yet. He said what does he need to further do so he can get this sch?  [phone] only if you call today

## 2017-05-09 ENCOUNTER — Encounter (HOSPITAL_COMMUNITY)
Admission: RE | Admit: 2017-05-09 | Discharge: 2017-05-09 | Disposition: A | Discharge status: Home / Self Care | Payer: Medicare HMO | Source: Ambulatory Visit | Attending: Urology | Admitting: Urology

## 2017-05-09 ENCOUNTER — Encounter (HOSPITAL_COMMUNITY): Payer: Self-pay

## 2017-05-09 ENCOUNTER — Ambulatory Visit (HOSPITAL_COMMUNITY)
Admission: RE | Admit: 2017-05-09 | Discharge: 2017-05-09 | Disposition: A | Discharge status: Home / Self Care | Payer: Medicare HMO | Source: Ambulatory Visit | Attending: Urology | Admitting: Urology

## 2017-05-09 DIAGNOSIS — C61 Malignant neoplasm of prostate: Secondary | ICD-10-CM | POA: Diagnosis not present

## 2017-05-09 MED ORDER — TECHNETIUM TC 99M MEDRONATE IV KIT
25.0000 | PACK | Freq: Once | INTRAVENOUS | Status: AC | PRN
  Administered 2017-05-09: 21.3 via INTRAVENOUS

## 2017-05-09 NOTE — Telephone Encounter (Signed)
Called pt and left a VM that this has been done.

## 2017-05-09 NOTE — Telephone Encounter (Signed)
I resent the referral

## 2017-05-10 DIAGNOSIS — R69 Illness, unspecified: Secondary | ICD-10-CM | POA: Diagnosis not present

## 2017-05-17 DIAGNOSIS — R310 Gross hematuria: Secondary | ICD-10-CM | POA: Diagnosis not present

## 2017-05-17 DIAGNOSIS — R338 Other retention of urine: Secondary | ICD-10-CM | POA: Diagnosis not present

## 2017-05-17 DIAGNOSIS — C61 Malignant neoplasm of prostate: Secondary | ICD-10-CM | POA: Diagnosis not present

## 2017-05-31 DIAGNOSIS — Z5111 Encounter for antineoplastic chemotherapy: Secondary | ICD-10-CM | POA: Diagnosis not present

## 2017-05-31 DIAGNOSIS — C61 Malignant neoplasm of prostate: Secondary | ICD-10-CM | POA: Diagnosis not present

## 2017-06-21 ENCOUNTER — Telehealth: Payer: Self-pay | Admitting: Oncology

## 2017-06-21 NOTE — Telephone Encounter (Signed)
Received an angry call from the pt's daughter saying that her father needs to see an oncologist for a second opinion. I was unaware of the pt wanting a 2nd opinion in March which is why I consulted with Dr. Zettie Pho office at that time. Pt was referred in March by Dr. Sarajane Jews and at that time I was made aware that the pt follows Dr. Tresa Moore at [facility]. I consulted with Bethena Roys on 3/21 and was told the pt had an appt on 3/26 with Dr. Tresa Moore and if he needed to be referred Dr. Tresa Moore would do so. I voiced understanding and the referral was closed. I also lft the pt a vm to cb and to explain this to him on 3/21.  To ease the tension I gave the patient's daughter an appt for her father to see Dr. Alen Blew on 5/2.  She's aware her father should arrive 30 minutes.  I cld Judy at [employer] to obtain the recent office notes and to explain the call I received from the daughter. She said there was no mention in Dr. Zettie Pho note of the patient wanting a second opinon. I asked that she please let Dr. Tresa Moore know what was going on.

## 2017-06-23 ENCOUNTER — Encounter: Payer: Self-pay | Admitting: Medical Oncology

## 2017-06-23 ENCOUNTER — Inpatient Hospital Stay: Payer: Medicare HMO | Attending: Oncology | Admitting: Oncology

## 2017-06-23 VITALS — BP 133/71 | HR 64 | Temp 98.6°F | Resp 18 | Ht 69.0 in | Wt 167.9 lb

## 2017-06-23 DIAGNOSIS — C61 Malignant neoplasm of prostate: Secondary | ICD-10-CM | POA: Diagnosis not present

## 2017-06-23 NOTE — Progress Notes (Signed)
Reason for Referral: Prostate cancer  HPI: 84 year old gentleman native of [place] but currently resides in [place] where he lived for an extended period of time.  He is a gentleman with history of hypertension, atrial fibrillation and coronary artery disease.  He was diagnosed with prostate cancer dating back to 2011.  1999 he had an elevated PSA and a biopsy showed no evidence of malignancy.  In 2011 repeat biopsy showed a Gleason score 3+4 = 7 without any evidence of metastatic disease.  He remained in active surveillance under the care of Dr. Carlota Raspberry at [facility].  He establish care with Dr. Tresa Moore locally in the last few years.    He started developing lower urinary tract symptoms in late 2018.  He has increased nocturia, incontinence and his PSA at that time was up to 11.43.  He subsequently underwent TURP procedure on February 04, 2017 which showed high-grade malignancy.  Staging work-up subsequently showed no metastatic disease in the bone.  CT scan of the abdomen and pelvis obtained on May 09, 2017 showed 5.6 x 3.0 soft tissue mass contiguous with the posterior aspect of the prostate glands extending into the left hemipelvis compatible with primary tumor.  There is a moderate left hydronephrosis noted.  Based on these findings, he was started on bicalutamide and Lupron under the care of Dr. Tresa Moore in April 2019.  He completed 4 weeks of bicalutamide and recently discontinued.  Despite the treatment, he still reports discomfort associated with his pelvic tumor.  He reports he cannot sit for an extended period of time and continues to have urinary symptoms including incontinence and frequency.  His appetite have also been marginal and of lost weight.  He still ambulates without a help of a cane or a walker.  He is able to drive but gave up driving recently.   He does not report any headaches, blurry vision, syncope or seizures. Does not report any fevers, chills or  sweats.  Does not report any cough, wheezing or hemoptysis.  Does not report any chest pain, palpitation, orthopnea or leg edema.  Does not report any nausea, vomiting or abdominal pain.  Does not report any constipation or diarrhea.  Does not report any skeletal complaints.     Does not report any skin rashes or lesions. Does not report any heat or cold intolerance.  Does not report any lymphadenopathy or petechiae.  Does not report any anxiety or depression.  Remaining review of systems is negative.    Past Medical History:  Diagnosis Date  . Complication of anesthesia    "serious cognisance problems since my OR in January" (03/13/2013  . Coronary artery disease   . Hx of echocardiogram    Echo 5/16:  Mild focal basal septal hypertrophy, EF 55%, mild AI, MV repair ok with trivial MR (mean 3 mmHg), mild LAE, mild RAE, PASP 35 mmHg  . Hypertension   . Irritable bowel syndrome   . PAF (paroxysmal atrial fibrillation) ([place]) 05/03/2014  . Personal history of colonic polyps    tubular adenoma  . Pleural effusion, bilateral 03/12/2013   Small to moderate, L>R  . Prostate cancer [facility]) 1991   sees Dr. Carlota Raspberry at [facility], observation only   . S/P CABG x 2 02/28/2013   LIMA to LAD, SVG to OM1, EVH via right thigh  . S/P Maze operation for atrial fibrillation 02/28/2013   Complete bilateral atrial lesion set using bipolar radiofrequency and cryothermy ablation with clipping of LA appendage  .  S/P mitral valve repair, maze procedure, and CABG x2 02/28/2013   Complex valvuloplasty including triangular resection of posterior leaflet, 30 mm Sorin Memo 3D ring annuloplasty  :  Past Surgical History:  Procedure Laterality Date  . CARDIAC CATHETERIZATION  2015  . CATARACT EXTRACTION, BILATERAL Bilateral 2014   per Dr. Gershon Crane   . COLONOSCOPY  2011   per Dr. Sharlett Iles, benign polyps, no repeats needed   . CORONARY ARTERY BYPASS GRAFT N/A 02/28/2013   Procedure: CORONARY ARTERY BYPASS GRAFTING (CABG);  Surgeon:  Rexene Alberts, MD;  Location: [place];  Service: Open Heart Surgery;  Laterality: N/A;  CABG x 2,  using left internal mammary artery and right leg saphenous vein harvested endoscopically  . HEMORRHOID SURGERY    . INTRAOPERATIVE TRANSESOPHAGEAL ECHOCARDIOGRAM N/A 02/28/2013   Procedure: INTRAOPERATIVE TRANSESOPHAGEAL ECHOCARDIOGRAM;  Surgeon: Rexene Alberts, MD;  Location: [place];  Service: Open Heart Surgery;  Laterality: N/A;  . LEFT HEART CATHETERIZATION WITH CORONARY ANGIOGRAM N/A 02/26/2013   Procedure: LEFT HEART CATHETERIZATION WITH CORONARY ANGIOGRAM;  Surgeon: Leonie Man, MD;  Location: [facility] CATH LAB;  Service: Cardiovascular;  Laterality: N/A;  . MAZE N/A 02/28/2013   Procedure: MAZE;  Surgeon: Rexene Alberts, MD;  Location: [place];  Service: Open Heart Surgery;  Laterality: N/A;  . MITRAL VALVE REPAIR N/A 02/28/2013   Procedure: MITRAL VALVE REPAIR (MVR);  Surgeon: Rexene Alberts, MD;  Location: [place];  Service: Open Heart Surgery;  Laterality: N/A;  . PROSTATE BIOPSY    . TRANSURETHRAL RESECTION OF PROSTATE N/A 02/04/2017   Procedure: TRANSURETHRAL RESECTION OF THE PROSTATE (TURP);  Surgeon: Alexis Frock, MD;  Location: [facility];  Service: Urology;  Laterality: N/A;  :   Current Outpatient Medications:  .  amoxicillin (AMOXIL) 500 MG capsule, TAKE 4 CAPSULES BY MOUTH 1 HOUR prior TO dental appointment., Disp: , Rfl: 1 .  aspirin 81 MG tablet, Take by mouth., Disp: , Rfl:  .  atorvastatin (LIPITOR) 10 MG tablet, TAKE 1 TABLET BY MOUTH EVERY DAY AT 6:00 PM, Disp: 90 tablet, Rfl: 3 .  cephALEXin (KEFLEX) 500 MG capsule, Take 1 capsule (500 mg total) by mouth 2 (two) times daily. X 5 days to prevent post-op infection with catheter in place., Disp: 10 capsule, Rfl: 0 .  Cholecalciferol (VITAMIN D3 PO), Take 2,000 Units by mouth daily., Disp: , Rfl:  .  fish oil-omega-3 fatty acids 1000 MG capsule, Take 1 g by mouth every morning. , Disp: , Rfl:  .  furosemide (LASIX) 20 MG tablet, Take 1  tablet (20 mg total) by mouth daily., Disp: 90 tablet, Rfl: 3 .  levothyroxine (SYNTHROID, LEVOTHROID) 50 MCG tablet, Take 1 tablet (50 mcg total) by mouth every morning., Disp: 90 tablet, Rfl: 3 .  meclizine (ANTIVERT) 25 MG tablet, Take 1 tablet (25 mg total) by mouth 3 (three) times daily as needed. (Patient not taking: Reported on 02/01/2017), Disp: 20 tablet, Rfl: 0 .  metoprolol tartrate (LOPRESSOR) 25 MG tablet, Take 1 tablet (25 mg total) by mouth 2 (two) times daily., Disp: 180 tablet, Rfl: 3 .  nitroGLYCERIN (NITROSTAT) 0.4 MG SL tablet, Place 1 tablet (0.4 mg total) under the tongue every 5 (five) minutes as needed for chest pain., Disp: 25 tablet, Rfl: 3 .  traMADol (ULTRAM) 50 MG tablet, Take 1-2 tablets (50-100 mg total) by mouth every 6 (six) hours as needed. For post-op pain, Disp: 30 tablet, Rfl: 0 .  vitamin C (ASCORBIC ACID) 500  MG tablet, Take 500 mg by mouth every morning. , Disp: , Rfl: :  Allergies  Allergen Reactions  . Other Other (See Comments)    Anesthesia---per pt this makes him "bonkers" OR   :  Family History  Problem Relation Age of Onset  . Hypertension Mother   . Sudden death Mother   . Hypertension Father   . Sudden death Father   . Heart disease Son   . Diabetes Unknown   . Hypertension Unknown   . Sudden death Unknown   . Heart disease Unknown   . Stroke Unknown   . Lung cancer Brother   . Stomach cancer Brother   . Cancer Daughter   . Cancer Brother   . Diabetes Sister   :  Social History   Socioeconomic History  . Marital status: Married    Spouse name: Not on file  . Number of children: 3  . Years of education: Not on file  . Highest education level: Not on file  Occupational History  . Occupation: Retired  Scientific laboratory technician  . Financial resource strain: Not on file  . Food insecurity:    Worry: Not on file    Inability: Not on file  . Transportation needs:    Medical: Not on file    Non-medical: Not on file  Tobacco Use  .  Smoking status: Never Smoker  . Smokeless tobacco: Never Used  Substance and Sexual Activity  . Alcohol use: No    Alcohol/week: 0.0 oz  . Drug use: No  . Sexual activity: Never  Lifestyle  . Physical activity:    Days per week: Not on file    Minutes per session: Not on file  . Stress: Not on file  Relationships  . Social connections:    Talks on phone: Not on file    Gets together: Not on file    Attends religious service: Not on file    Active member of club or organization: Not on file    Attends meetings of clubs or organizations: Not on file    Relationship status: Not on file  . Intimate partner violence:    Fear of current or ex partner: Not on file    Emotionally abused: Not on file    Physically abused: Not on file    Forced sexual activity: Not on file  Other Topics Concern  . Not on file  Social History Narrative  . Not on file  :  Pertinent items are noted in HPI.  Exam: Blood pressure 133/71, pulse 64, temperature 98.6 F (37 C), temperature source Oral, resp. rate 18, height 5\' 9"  (1.753 m), weight 167 lb 14.4 oz (76.2 kg), SpO2 100 %. General appearance: alert and cooperative appeared without distress. Head: atraumatic without any abnormalities. Eyes: conjunctivae/corneas clear. PERRL.  Sclera anicteric. Throat: lips, mucosa, and tongue normal; without oral thrush or ulcers. Resp: clear to auscultation bilaterally without rhonchi, wheezes or dullness to percussion. Cardio: regular rate and rhythm, S1, S2 normal, no murmur, click, rub or gallop GI: soft, non-tender; bowel sounds normal; no masses,  no organomegaly Skin: Skin color, texture, turgor normal. No rashes or lesions Lymph nodes: Cervical, supraclavicular, and axillary nodes normal. Neurologic: Grossly normal without any motor, sensory or deep tendon reflexes. Musculoskeletal: No joint deformity or effusion.  CBC    Component Value Date/Time   WBC 6.8 01/26/2017 1103   RBC 4.25 01/26/2017  1103   HGB 11.1 (L) 02/05/2017 0411   HCT 33.0 (  L) 02/05/2017 0411   PLT 241.0 01/26/2017 1103   MCV 91.7 01/26/2017 1103   MCH 30.4 05/03/2014 0911   MCHC 34.0 01/26/2017 1103   RDW 13.1 01/26/2017 1103   LYMPHSABS 1.1 01/26/2017 1103   MONOABS 0.4 01/26/2017 1103   EOSABS 0.1 01/26/2017 1103   BASOSABS 0.0 01/26/2017 1103     Chemistry      Component Value Date/Time   NA 140 03/03/2017 1006   K 4.4 03/03/2017 1006   CL 106 03/03/2017 1006   CO2 27 03/03/2017 1006   BUN 30 (H) 03/03/2017 1006   CREATININE 1.62 (H) 03/03/2017 1006      Component Value Date/Time   CALCIUM 8.5 03/03/2017 1006   ALKPHOS 60 01/26/2017 1103   AST 18 01/26/2017 1103   ALT 15 01/26/2017 1103   BILITOT 0.7 01/26/2017 1103     Results for ROGERICK, BALDWIN (MRN [id number]) as of 06/23/2017 14:28  Ref. Range 01/20/2015 07:38 06/27/2015 07:35 01/20/2016 15:56 07/01/2016 13:40 01/26/2017 11:03  PSA Latest Ref Range: 0.10 - 4.00 ng/mL 20.02 (H) 17.86 (H) 10.34 (H) 11.43 (H) 10.84 (H)    Assessment and Plan:   84 year old gentleman with:  1.  Locally advanced prostate cancer: His Gleason score is 3+4 = 7 diagnosed in 2000.  He developed local progression of his disease in December 2018 and developed a soft tissue mass measuring 5.6 x 3.0 cm.  He status post TURP procedure which showed a poorly differentiated cancer close to a Gleason score of 5.  He was started on androgen deprivation therapy with Dr. Tresa Moore.  The natural course of this disease was discussed today with the patient and his family.  He understands he has an incurable malignancy but disease that could be palliated and symptoms can be controlled.  I agree with Dr. Tresa Moore that treating with androgen deprivation therapy is the gold standard at this time.  The role for any additional therapy such as Zytiga or systemic chemotherapy in the hormone sensitive space was discussed but I feel that this is inappropriate given the treatment goals for  him.  We also discussed the role for palliative radiation therapy if his pelvic discomfort does not improve or worsen in the future.  The role of radiation was discussed today in detail as well as potential complications.  He is receptive to the idea of receiving pelvic radiation for palliative purposes in the future if his symptoms do not improve.  The role of systemic chemotherapy as well as second line hormone therapy was reviewed as a potential options in the future if he progressed on current therapy.  2.  Androgen deprivation: I have recommended continuing Lupron indefinitely.  He is receiving 45 mg every 6 months with his last dose on May 31, 2017.  3.  Pelvic discomfort: Related to his prostate cancer.  I recommended considering radiation therapy in the future if his symptoms do not improve.  4.  Follow-up: He will continue to follow-up with Dr. Tresa Moore regarding his prostate cancer.  I am happy to see him in the future as needed.  60  minutes was spent with the patient face-to-face today.  More than 50% of time was dedicated to patient counseling, education and discussing different options of treatment as well as future plan of care.

## 2017-06-23 NOTE — Progress Notes (Signed)
Introduced myself to [name] and his daughter as the prostate nurse navigator and my role. He states he was diagnosed with prostate cancer in 2011. He has remained under active surveillance until he developed urinary symptoms that led to a TURP. The pathology showed cancer and his CT showed a mass. He states he started Lupron in April and bicalutamide. He is here today for a second opinion. He states Dr. Alen Blew agrees with Dr. Tresa Moore to continue the Lupron and if his pain get worse, we can refer him for radiation consult. I gave them my business card and asked them to call if they have questions or concerns.He will continue to follow up with Dr. Tammi Klippel.

## 2017-06-24 ENCOUNTER — Telehealth: Payer: Self-pay

## 2017-06-24 NOTE — Telephone Encounter (Signed)
Per 5/3 no los °

## 2017-08-08 DIAGNOSIS — C61 Malignant neoplasm of prostate: Secondary | ICD-10-CM | POA: Diagnosis not present

## 2017-08-15 DIAGNOSIS — C61 Malignant neoplasm of prostate: Secondary | ICD-10-CM | POA: Diagnosis not present

## 2017-08-15 DIAGNOSIS — N3945 Continuous leakage: Secondary | ICD-10-CM | POA: Diagnosis not present

## 2017-08-15 DIAGNOSIS — N130 Hydronephrosis with ureteropelvic junction obstruction: Secondary | ICD-10-CM | POA: Diagnosis not present

## 2017-08-15 DIAGNOSIS — N183 Chronic kidney disease, stage 3 (moderate): Secondary | ICD-10-CM | POA: Diagnosis not present

## 2017-08-15 DIAGNOSIS — R310 Gross hematuria: Secondary | ICD-10-CM | POA: Diagnosis not present

## 2017-08-15 DIAGNOSIS — R338 Other retention of urine: Secondary | ICD-10-CM | POA: Diagnosis not present

## 2017-09-06 ENCOUNTER — Telehealth: Payer: Self-pay | Admitting: Oncology

## 2017-09-06 NOTE — Telephone Encounter (Signed)
Scheduled appt per vmail left from patient - pt request a f./u and per FS follow up okay on 8/8 @ 12 - scheduled appt and left message with appt date and time.

## 2017-09-20 ENCOUNTER — Encounter: Payer: Self-pay | Admitting: Family Medicine

## 2017-09-20 ENCOUNTER — Ambulatory Visit (INDEPENDENT_AMBULATORY_CARE_PROVIDER_SITE_OTHER): Payer: Medicare HMO | Admitting: Family Medicine

## 2017-09-20 ENCOUNTER — Ambulatory Visit: Payer: Self-pay | Admitting: *Deleted

## 2017-09-20 VITALS — BP 118/58 | HR 58 | Temp 98.3°F | Ht 69.0 in | Wt 175.0 lb

## 2017-09-20 DIAGNOSIS — R319 Hematuria, unspecified: Secondary | ICD-10-CM

## 2017-09-20 DIAGNOSIS — C61 Malignant neoplasm of prostate: Secondary | ICD-10-CM | POA: Diagnosis not present

## 2017-09-20 DIAGNOSIS — N390 Urinary tract infection, site not specified: Secondary | ICD-10-CM | POA: Diagnosis not present

## 2017-09-20 LAB — POCT URINALYSIS DIPSTICK
Glucose, UA: NEGATIVE
NITRITE UA: POSITIVE
PH UA: 6 (ref 5.0–8.0)
PROTEIN UA: POSITIVE — AB
Spec Grav, UA: >=1.03 — AB (ref 1.010–1.025)
Urobilinogen, UA: 1 E.U./dL

## 2017-09-20 MED ORDER — CIPROFLOXACIN HCL 500 MG PO TABS: 500.0000 mg | ORAL_TABLET | Freq: Two times a day (BID) | ORAL | 0 refills | Status: DC

## 2017-09-20 NOTE — Telephone Encounter (Signed)
I confirmed patient is on the schedule to see Dr. Sarajane Jews this morning.

## 2017-09-20 NOTE — Progress Notes (Signed)
   Subjective:    Patient ID: [name]., male    DOB: [birth year], 84 y.o.   MRN: [id number]  HPI Here with his daughter for several days of urinary burning and rectal pain. He has had incontinence and urgency for several months, and he has been passing blood clots in the urine for several months. He wears adult diapers. No fevers. He has been seeing Dr. Tresa Moore for prostate cancer and he has been receiving Lupron injections. He has also been seeing Dr. Alen Blew for oncologic input. The family is very unhappy with the care he has been receiving and they want to see a different urologist.    Review of Systems  Constitutional: Negative.   Respiratory: Negative.   Cardiovascular: Negative.   Gastrointestinal: Negative.   Genitourinary: Positive for dysuria, frequency, hematuria and urgency. Negative for flank pain.       Objective:   Physical Exam  Constitutional: He is oriented to person, place, and time. He appears well-developed and well-nourished.  Cardiovascular: Normal rate, regular rhythm, normal heart sounds and intact distal pulses.  Pulmonary/Chest: Effort normal and breath sounds normal.  Abdominal: Soft. Bowel sounds are normal. He exhibits no distension and no mass. There is no tenderness. There is no rebound and no guarding. No hernia.  Neurological: He is alert and oriented to person, place, and time.          Assessment & Plan:  He has a UTI, likely a prostatitis. Treat with Cipro and culture the sample. Per the family's wishes we will refer him to another Urologist. They plan to continue seeing Dr. Alen Blew.  Alysia Penna, MD

## 2017-09-20 NOTE — Telephone Encounter (Signed)
Pt called with possible UTI . He states his urine has a very strong odor. He first noticed this over the weekend. . He had prostate surgery in Dec and sometimes will still have blood in his urine for that. So the blood comes and goes. He denies having a fever or pain. He says he has been having more output also. He is wearing depends and has to change them about every 2 hours.  He has had UTIs before and know what the symptoms. He is drinking lots of water.  Denies flank pain.  Appointment scheduled per protocol. [facility] at [facility] notified.  Reason for Disposition . Bad or foul-smelling urine  Answer Assessment - Initial Assessment Questions 1. SYMPTOM: "What's the main symptom you're concerned about?" (e.g., frequency, incontinence)     Frequency and odor 2. ONSET: "When did the  UTI  start?"     On the weekend 3. PAIN: "Is there any pain?" If so, ask: "How bad is it?" (Scale: 1-10; mild, moderate, severe)     no 4. CAUSE: "What do you think is causing the symptoms?"     Possible UTI 5. OTHER SYMPTOMS: "Do you have any other symptoms?" (e.g., fever, flank pain, blood in urine, pain with urination)     Blood in urine but that is from surgery in Dec  Protocols used: URINARY [facility]

## 2017-09-21 LAB — URINE CULTURE
MICRO NUMBER:: 90899436
SPECIMEN QUALITY: ADEQUATE

## 2017-09-29 ENCOUNTER — Inpatient Hospital Stay: Payer: Medicare HMO | Attending: Oncology | Admitting: Oncology

## 2017-09-29 ENCOUNTER — Telehealth: Payer: Self-pay | Admitting: Oncology

## 2017-09-29 VITALS — BP 119/57 | HR 66 | Temp 98.7°F | Resp 17 | Ht 69.0 in | Wt 177.5 lb

## 2017-09-29 DIAGNOSIS — C61 Malignant neoplasm of prostate: Secondary | ICD-10-CM | POA: Diagnosis not present

## 2017-09-29 DIAGNOSIS — Z79899 Other long term (current) drug therapy: Secondary | ICD-10-CM | POA: Diagnosis not present

## 2017-09-29 DIAGNOSIS — Z191 Hormone sensitive malignancy status: Secondary | ICD-10-CM | POA: Diagnosis not present

## 2017-09-29 DIAGNOSIS — R319 Hematuria, unspecified: Secondary | ICD-10-CM | POA: Insufficient documentation

## 2017-09-29 DIAGNOSIS — Z7982 Long term (current) use of aspirin: Secondary | ICD-10-CM | POA: Insufficient documentation

## 2017-09-29 NOTE — Progress Notes (Signed)
Hematology and Oncology Follow Up Visit  [name] [id number] [birth year] 84 y.o. 09/29/2017 11:21 AM [name], MDFry, Luis Holter, MD   Principle Diagnosis: 84 year old man with prostate cancer diagnosed in 2000 with a Gleason score 3+4 = 7.  He has developed local progression with soft tissue mass documented in December 2018 and he has castration sensitive disease.   Prior Therapy:  Active surveillance without any definitive treatment for his prostate cancer.  Current therapy:  Androgen deprivation under the care of Dr. Tresa [name] started in April 2019.  Is currently receiving Lupron.  Interim History: [name] presents today for a follow-up visit.  He continues to report issues with pelvic discomfort and hematuria.  He was diagnosed with a urinary tract infection and was started on ciprofloxacin by Dr. Sarajane [name] which he has been taking for over a week at this time.  He reports his hematuria has improved with the start of antibiotics.  He continues to have issues with incontinence as well as pelvic discomfort.  His last Lupron therapy was given in April 2019 without any significant improvement in his local symptoms.  He has an appointment with urology at [facility] after switching from the care of Dr. Tresa [name].  Clinically, he reports no other complaints.  His appetite is reasonable and is ambulating without any difficulties.  He does report some unsteadiness but no falls.  His appetite is reasonable and has not reported any change in his bowel habits.  He does not report any headaches, blurry vision, syncope or seizures. Does not report any fevers, chills or sweats.  Does not report any cough, wheezing or hemoptysis.  Does not report any chest pain, palpitation, orthopnea or leg edema.  Does not report any nausea, vomiting or abdominal pain.  Does not report any constipation or diarrhea.  Does not report any skeletal complaints.    Does not report frequency, urgency or hematuria.  Does not  report any skin rashes or lesions. Does not report any heat or cold intolerance.  Does not report any lymphadenopathy or petechiae.  Does not report any anxiety or depression.  Remaining review of systems is negative.    Medications: I have reviewed the patient's current medications.  Current Outpatient Medications  Medication Sig Dispense Refill  . amoxicillin (AMOXIL) 500 MG capsule TAKE 4 CAPSULES BY MOUTH 1 HOUR prior TO dental appointment.  1  . aspirin 81 MG tablet Take by mouth.    [name] atorvastatin (LIPITOR) 10 MG tablet TAKE 1 TABLET BY MOUTH EVERY DAY AT 6:00 PM 90 tablet 3  . Cholecalciferol (VITAMIN D3 PO) Take 2,000 Units by mouth daily.    . ciprofloxacin (CIPRO) 500 MG tablet Take 1 tablet (500 mg total) by mouth 2 (two) times daily. 28 tablet 0  . finasteride (PROSCAR) 5 MG tablet Take 1 tablet by mouth daily.    . fish oil-omega-3 fatty acids 1000 MG capsule Take 1 g by mouth every morning.     . furosemide (LASIX) 20 MG tablet Take 1 tablet (20 mg total) by mouth daily. 90 tablet 3  . levothyroxine (SYNTHROID, LEVOTHROID) 50 MCG tablet Take 1 tablet (50 mcg total) by mouth every morning. 90 tablet 3  . meclizine (ANTIVERT) 25 MG tablet Take 1 tablet (25 mg total) by mouth 3 (three) times daily as needed. 20 tablet 0  . metoprolol tartrate (LOPRESSOR) 25 MG tablet Take 1 tablet (25 mg total) by mouth 2 (two) times daily. 180 tablet 3  .  nitroGLYCERIN (NITROSTAT) 0.4 MG SL tablet Place 1 tablet (0.4 mg total) under the tongue every 5 (five) minutes as needed for chest pain. 25 tablet 3  . vitamin C (ASCORBIC ACID) 500 MG tablet Take 500 mg by mouth every morning.      No current facility-administered medications for this visit.      Allergies:  Allergies  Allergen Reactions  . Other Other (See Comments)    ANESTHESIA.... 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    Past Medical History, Surgical history, Social history, and Family History were reviewed and  updated.   Physical Exam: Blood pressure (!) 119/57, pulse 66, temperature 98.7 F (37.1 C), temperature source Oral, resp. rate 17, height 5\' 9"  (1.753 m), weight 177 lb 8 oz (80.5 kg), SpO2 100 %.   ECOG:  1 General appearance: alert and cooperative appeared without distress. Head: Normocephalic, without obvious abnormality Oropharynx: No oral thrush or ulcers. Eyes: No scleral icterus.  Pupils are equal and round reactive to light. Lymph nodes: Cervical, supraclavicular, and axillary nodes normal. Heart:regular rate and rhythm, S1, S2 without leg edema. Lung:chest clear, no wheezing, rales, normal symmetric air entry Abdomin: soft, non-tender, without masses or organomegaly. Neurological: No motor, sensory deficits.  Intact deep tendon reflexes. Skin: No rashes or lesions.  No ecchymosis or petechiae. Musculoskeletal: No joint deformity or effusion.     Lab Results: Lab Results  Component Value Date   WBC 6.8 01/26/2017   HGB 11.1 (L) 02/05/2017   HCT 33.0 (L) 02/05/2017   MCV 91.7 01/26/2017   PLT 241.0 01/26/2017     Chemistry      Component Value Date/Time   NA 140 03/03/2017 1006   K 4.4 03/03/2017 1006   CL 106 03/03/2017 1006   CO2 27 03/03/2017 1006   BUN 30 (H) 03/03/2017 1006   CREATININE 1.62 (H) 03/03/2017 1006      Component Value Date/Time   CALCIUM 8.5 03/03/2017 1006   ALKPHOS 60 01/26/2017 1103   AST 18 01/26/2017 1103   ALT 15 01/26/2017 1103   BILITOT 0.7 01/26/2017 1103        Impression and Plan:   84 year old man with the following issues:  84 year old man with:  1.    Prostate cancer presented with Gleason score is 3+4 = 7 diagnosed in 2000.  Not received any definitive treatment and developed local progression of his disease in December 2018 with a soft tissue mass measuring 5.6 x 3.0 cm.  He has been on androgen deprivation therapy since that time.  Despite starting on systemic therapy he still has a lot of local symptoms  including hematuria as well as pelvic discomfort.  His hematuria has improved after the start of antibiotics.  The natural course of this disease was discussed again today and I reiterated the findings have been discussed with him in the past.  He does have locally advanced castration sensitive disease and I do not see any role for any additional systemic therapy.  The role for additional local therapy was discussed today in detail.  Repeat imaging studies with CT scan of the abdomen and pelvis and potentially evaluating for radiation therapy would be an option to offer palliation of his symptoms.  He understands he has an incurable malignancy disease that could potentially be palliated.  I will arrange for the CT scan as well as radiation appointment.  2.  Androgen deprivation: He received a Lupron in April 2019 and due again in  October 2019.  He is switching urologist and I am happy to resume Lupron for him if he is not to get it with his new urologist.  3.  Pelvic discomfort: He is to be an issue with him despite androgen deprivation.  Local treatment with radiation therapy may be reasonable at this time.  I will make the appropriate referrals for initial evaluation.  4.  Follow-up: We will be in 3 months to follow his progress.  25  minutes was spent with the patient face-to-face today.  More than 50% of time was dedicated to discussing the natural course of this disease, reviewing previous imaging studies and discussing treatment options and coordinating his future care.    [name], MD 8/8/201911:21 AM

## 2017-09-29 NOTE — Telephone Encounter (Signed)
Scheduled appt per 8/8 los - gave patient AVS and calender per los.  

## 2017-09-30 DIAGNOSIS — R339 Retention of urine, unspecified: Secondary | ICD-10-CM | POA: Diagnosis not present

## 2017-09-30 DIAGNOSIS — C61 Malignant neoplasm of prostate: Secondary | ICD-10-CM | POA: Diagnosis not present

## 2017-09-30 DIAGNOSIS — R310 Gross hematuria: Secondary | ICD-10-CM | POA: Diagnosis not present

## 2017-10-04 DIAGNOSIS — N35912 Unspecified bulbous urethral stricture, male: Secondary | ICD-10-CM | POA: Diagnosis not present

## 2017-10-06 ENCOUNTER — Encounter: Payer: Self-pay | Admitting: Radiation Oncology

## 2017-10-06 NOTE — Progress Notes (Signed)
GU Location of Tumor / Histology: Prostate Cancer  If Prostate Cancer, 2011 Gleason Score was (3 + 4) and on 04/2017 PSA was (8.4)  [name].  Was diagnosed with prostate cancer back in 2011 and has remained under surveillance. He developed a lower UTI in late 2018 and subsequently underwent a TURP on 02/04/2017. He is being followed by Dr. [name] and Dr. Alexis Frock.  Biopsies revealed: 02/04/2017: (TURP) Diagnosis Prostate, chips - HIGH GRADE CARCINOMA - SEE COMMENT Microscopic Comment There is an abundant amount of solid tumor present in the sample with necrosis. By immunohistochemistry, the neoplasm is positive for prostein but negative for cytokeratin 903, p63 and Gata-3. The immunophenotype combined with the patient's history supports the diagnosis of prostatic adenocarcinoma, Gleason pattern 5.  CT Scan 05/09/2017   Past/Anticipated interventions by urology, if any:  02/04/2017 Surgeon: Dr. Alexis Frock Transurethral resection of prostate  Past/Anticipated interventions by medical oncology, if any:  Androgen deprivation under the care of Dr. Tresa Moore started in April 2019. Currently receiving Lupron 45 mg every 6 months. Next Lupron dose due October 2019.  Weight changes, if any: no  Bowel/Bladder complaints, if any: Pelvic discomfort, hematuria, and episodes of incontinence (though his hematuria improved after starting antibiotics prescribed by Dr. Sarajane Jews); Frequent urination, hard to postpone urination, nocturia, leakage of urine (finally catheter free but does use adult diapers), and pain between the base of his penis and anus. Reports regular formed bowel movements without pain or difficulty. Reports he changes his depends every 3 hours related to difficulty emptying his bladder and persistent leakage.   Nausea/Vomiting, if any: no  Pain issues, if any:  "My bottom between my penis and anus" 4 on a scale of 0-10. Unable to sit longer than 15 minutes without  pressure like pain. Denies taking medication to relieve pain. Reports pain increases the longer he sits.   SAFETY ISSUES:  Prior radiation? no  Pacemaker/ICD? no  Possible current pregnancy? N/A  Is the patient on methotrexate? no  Current Complaints / other details:  84 year old male. Married resides with wife of 20 years at [facility]. 2 daughters (1 deceased) and one son.

## 2017-10-07 DIAGNOSIS — C61 Malignant neoplasm of prostate: Secondary | ICD-10-CM | POA: Diagnosis not present

## 2017-10-10 ENCOUNTER — Ambulatory Visit
Admission: RE | Admit: 2017-10-10 | Discharge: 2017-10-10 | Disposition: A | Discharge status: Home / Self Care | Payer: Medicare HMO | Source: Ambulatory Visit | Attending: Radiation Oncology | Admitting: Radiation Oncology

## 2017-10-10 ENCOUNTER — Other Ambulatory Visit: Payer: Self-pay

## 2017-10-10 ENCOUNTER — Encounter: Payer: Self-pay | Admitting: Radiation Oncology

## 2017-10-10 VITALS — BP 129/72 | HR 65 | Temp 98.7°F | Resp 20 | Wt 176.4 lb

## 2017-10-10 DIAGNOSIS — C7952 Secondary malignant neoplasm of bone marrow: Secondary | ICD-10-CM

## 2017-10-10 DIAGNOSIS — C61 Malignant neoplasm of prostate: Secondary | ICD-10-CM

## 2017-10-10 DIAGNOSIS — Z79818 Long term (current) use of other agents affecting estrogen receptors and estrogen levels: Secondary | ICD-10-CM | POA: Diagnosis not present

## 2017-10-10 DIAGNOSIS — Z7982 Long term (current) use of aspirin: Secondary | ICD-10-CM | POA: Insufficient documentation

## 2017-10-10 DIAGNOSIS — R9431 Abnormal electrocardiogram [ECG] [EKG]: Secondary | ICD-10-CM | POA: Diagnosis not present

## 2017-10-10 DIAGNOSIS — Z9079 Acquired absence of other genital organ(s): Secondary | ICD-10-CM | POA: Diagnosis not present

## 2017-10-10 DIAGNOSIS — C7951 Secondary malignant neoplasm of bone: Secondary | ICD-10-CM | POA: Diagnosis not present

## 2017-10-10 DIAGNOSIS — R9721 Rising PSA following treatment for malignant neoplasm of prostate: Secondary | ICD-10-CM | POA: Diagnosis not present

## 2017-10-10 DIAGNOSIS — I2510 Atherosclerotic heart disease of native coronary artery without angina pectoris: Secondary | ICD-10-CM | POA: Diagnosis not present

## 2017-10-10 DIAGNOSIS — I480 Paroxysmal atrial fibrillation: Secondary | ICD-10-CM | POA: Insufficient documentation

## 2017-10-10 DIAGNOSIS — N35919 Unspecified urethral stricture, male, unspecified site: Secondary | ICD-10-CM | POA: Diagnosis not present

## 2017-10-10 DIAGNOSIS — Z01810 Encounter for preprocedural cardiovascular examination: Secondary | ICD-10-CM | POA: Diagnosis not present

## 2017-10-10 DIAGNOSIS — Z951 Presence of aortocoronary bypass graft: Secondary | ICD-10-CM | POA: Diagnosis not present

## 2017-10-10 DIAGNOSIS — Z79899 Other long term (current) drug therapy: Secondary | ICD-10-CM | POA: Diagnosis not present

## 2017-10-10 DIAGNOSIS — I10 Essential (primary) hypertension: Secondary | ICD-10-CM | POA: Diagnosis not present

## 2017-10-10 NOTE — Progress Notes (Signed)
Radiation Oncology         [phone]) [phone] ________________________________  Initial Outpatient Consultation  Name: [name]. MRN: [id number]  Date of Service: 10/10/2017 DOB: 08/18/1927  CC:Fry, Ishmael Holter, MD  Wyatt Portela, MD   REFERRING PHYSICIAN: Wyatt Portela, MD  DIAGNOSIS: 84 y.o. gentleman with progressive locally advanced prostate cancer    ICD-10-CM   1. Malignant neoplasm of prostate ([place]) C61   2. Secondary malignant neoplasm of bone and bone marrow ([facility]) C79.51    C79.52     HISTORY OF PRESENT ILLNESS: Luis Butch. is a 84 y.o. male seen at the request of Dr. Alen Blew for locally advanced prostate cancer.  In 1999, he had an elevated PSA of 5-6, and initial biopsy showed no evidence of malignancy.  He reports his PSA has been as high as 25.  A repeat biopsy taken approximately 2008-2010 showed a Gleason score 3+4 without any evidence of metastatic disease.  He remained in active surveillance under the care of Dr. Carlota Raspberry at [facility].  He established care with Dr. Tresa Moore locally in the last few years.    He started developing lower urinary tract symptoms with increased nocturia and incontinence in December 2018, and his PSA at that time was up to 10.84.  He subsequently underwent TURP procedure on 02/04/17 which showed high-grade Gleason 10 carcinoma.  Staging work-up subsequently showed no metastatic disease in the bone.  CT scan of the abdomen and pelvis obtained on 05/09/17 showed a 5.6 x 3.0 cm irregular soft tissue mass contiguous with the posterior left aspect of the prostate gland extending into the left hemipelvis.  There was also moderate left hydroureteronephrosis secondary to the left hemipelvic mass.  Based on these findings, he was started on Lupron every 6 months and bicalutamide under the care of Dr. Tresa Moore on 05/31/17.  He completed 4 weeks of bicalutamide.  He was also started on finasteride but this has been discontinued.  His  PSA has fallen to 4.1 as of 09/30/17.  Despite the treatment, he still reports discomfort associated with his pelvic tumor and cannot sit for more than 15 minutes.    Recently he was diagnosed with a urinary tract infection and was started on ciprofloxacin.  He reports his hematuria has improved with the start of antibiotics.  He continues to have issues with incontinence as well as pelvic discomfort.  He has kindly been referred today for discussion of potential radiation treatment options. He is accompanied by his daughter who is his POA.  He states that he has transferred his urology care to Dr. Harlow Asa and has cystoscopy and possible TURP scheduled for 10/12/17.  PREVIOUS RADIATION THERAPY: No  PAST MEDICAL HISTORY:  Past Medical History:  Diagnosis Date  . Complication of anesthesia    "serious cognisance problems since my OR in January" (03/13/2013  . Coronary artery disease   . Hx of echocardiogram    Echo 5/16:  Mild focal basal septal hypertrophy, EF 55%, mild AI, MV repair ok with trivial MR (mean 3 mmHg), mild LAE, mild RAE, PASP 35 mmHg  . Hypertension   . Irritable bowel syndrome   . PAF (paroxysmal atrial fibrillation) ([place]) 05/03/2014  . Personal history of colonic polyps    tubular adenoma  . Pleural effusion, bilateral 03/12/2013   Small to moderate, L>R  . Prostate cancer [facility]) 1991   sees Dr. Carlota Raspberry at [facility], observation only   . S/P CABG x  2 02/28/2013   LIMA to LAD, SVG to OM1, EVH via right thigh  . S/P Maze operation for atrial fibrillation 02/28/2013   Complete bilateral atrial lesion set using bipolar radiofrequency and cryothermy ablation with clipping of LA appendage  . S/P mitral valve repair, maze procedure, and CABG x2 02/28/2013   Complex valvuloplasty including triangular resection of posterior leaflet, 30 mm Sorin Memo 3D ring annuloplasty      PAST SURGICAL HISTORY: Past Surgical History:  Procedure Laterality Date  . CARDIAC CATHETERIZATION  2015  .  CATARACT EXTRACTION, BILATERAL Bilateral 2014   per Dr. Gershon Crane   . COLONOSCOPY  2011   per Dr. Sharlett Iles, benign polyps, no repeats needed   . CORONARY ARTERY BYPASS GRAFT N/A 02/28/2013   Procedure: CORONARY ARTERY BYPASS GRAFTING (CABG);  Surgeon: Rexene Alberts, MD;  Location: [place];  Service: Open Heart Surgery;  Laterality: N/A;  CABG x 2,  using left internal mammary artery and right leg saphenous vein harvested endoscopically  . HEMORRHOID SURGERY    . INTRAOPERATIVE TRANSESOPHAGEAL ECHOCARDIOGRAM N/A 02/28/2013   Procedure: INTRAOPERATIVE TRANSESOPHAGEAL ECHOCARDIOGRAM;  Surgeon: Rexene Alberts, MD;  Location: [place];  Service: Open Heart Surgery;  Laterality: N/A;  . LEFT HEART CATHETERIZATION WITH CORONARY ANGIOGRAM N/A 02/26/2013   Procedure: LEFT HEART CATHETERIZATION WITH CORONARY ANGIOGRAM;  Surgeon: Leonie Man, MD;  Location: [facility] CATH LAB;  Service: Cardiovascular;  Laterality: N/A;  . MAZE N/A 02/28/2013   Procedure: MAZE;  Surgeon: Rexene Alberts, MD;  Location: [place];  Service: Open Heart Surgery;  Laterality: N/A;  . MITRAL VALVE REPAIR N/A 02/28/2013   Procedure: MITRAL VALVE REPAIR (MVR);  Surgeon: Rexene Alberts, MD;  Location: [place];  Service: Open Heart Surgery;  Laterality: N/A;  . PROSTATE BIOPSY    . TRANSURETHRAL RESECTION OF PROSTATE N/A 02/04/2017   Procedure: TRANSURETHRAL RESECTION OF THE PROSTATE (TURP);  Surgeon: Alexis Frock, MD;  Location: [facility];  Service: Urology;  Laterality: N/A;    FAMILY HISTORY:  Family History  Problem Relation Age of Onset  . Hypertension Mother   . Sudden death Mother   . Hypertension Father   . Sudden death Father   . Heart disease Son   . Diabetes Unknown   . Hypertension Unknown   . Sudden death Unknown   . Heart disease Unknown   . Stroke Unknown   . Lung cancer Brother   . Stomach cancer Brother   . Cancer Daughter   . Cancer Brother   . Diabetes Sister     SOCIAL HISTORY:  Social History   Socioeconomic  History  . Marital status: Married    Spouse name: Not on file  . Number of children: 3  . Years of education: Not on file  . Highest education level: Not on file  Occupational History  . Occupation: Retired  Scientific laboratory technician  . Financial resource strain: Not on file  . Food insecurity:    Worry: Not on file    Inability: Not on file  . Transportation needs:    Medical: Not on file    Non-medical: Not on file  Tobacco Use  . Smoking status: Never Smoker  . Smokeless tobacco: Never Used  Substance and Sexual Activity  . Alcohol use: No    Alcohol/week: 0.0 standard drinks  . Drug use: No  . Sexual activity: Not Currently  Lifestyle  . Physical activity:    Days per week: Not on file  Minutes per session: Not on file  . Stress: Not on file  Relationships  . Social connections:    Talks on phone: Not on file    Gets together: Not on file    Attends religious service: Not on file    Active member of club or organization: Not on file    Attends meetings of clubs or organizations: Not on file    Relationship status: Not on file  . Intimate partner violence:    Fear of current or ex partner: Not on file    Emotionally abused: Not on file    Physically abused: Not on file    Forced sexual activity: Not on file  Other Topics Concern  . Not on file  Social History Narrative  . Not on file  The patient lives in [place]. He retired from Surveyor, quantity.  ALLERGIES: Other  MEDICATIONS:  Current Outpatient Medications  Medication Sig Dispense Refill  . aspirin 325 MG tablet Take 325 mg by mouth daily.    [name] atorvastatin (LIPITOR) 10 MG tablet TAKE 1 TABLET BY MOUTH EVERY DAY AT 6:00 PM 90 tablet 3  . bicalutamide (CASODEX) 50 MG tablet Take 50 mg by mouth daily.    . Cholecalciferol (VITAMIN D3 PO) Take 2,000 Units by mouth daily.    . finasteride (PROSCAR) 5 MG tablet Take 1 tablet by mouth daily.    . fish oil-omega-3 fatty acids 1000 MG capsule Take 1 g by mouth every  morning.     . furosemide (LASIX) 20 MG tablet Take 1 tablet (20 mg total) by mouth daily. 90 tablet 3  . levothyroxine (SYNTHROID, LEVOTHROID) 50 MCG tablet Take 1 tablet (50 mcg total) by mouth every morning. 90 tablet 3  . meclizine (ANTIVERT) 25 MG tablet Take 1 tablet (25 mg total) by mouth 3 (three) times daily as needed. 20 tablet 0  . metoprolol tartrate (LOPRESSOR) 25 MG tablet Take 1 tablet (25 mg total) by mouth 2 (two) times daily. 180 tablet 3  . vitamin C (ASCORBIC ACID) 500 MG tablet Take 500 mg by mouth every morning.     [name] amoxicillin (AMOXIL) 500 MG capsule TAKE 4 CAPSULES BY MOUTH 1 HOUR prior TO dental appointment.  1  . nitroGLYCERIN (NITROSTAT) 0.4 MG SL tablet Place 1 tablet (0.4 mg total) under the tongue every 5 (five) minutes as needed for chest pain. (Patient not taking: Reported on 10/10/2017) 25 tablet 3   No current facility-administered medications for this encounter.     REVIEW OF SYSTEMS:  On review of systems, the patient reports that he is doing well overall. He denies any chest pain, shortness of breath, cough, fevers, chills, night sweats, or unintended weight changes. He denies abdominal pain, nausea or vomiting. He reports urinary frequency, urgency, nocturia, and incontinence requiring Depends. He reports hematuria, improved after starting Cipro. He reports penile pain. He reports 4/10 pressure-like pain in his perineum area that started after TURP and has gotten worse since then. He states that he cannot sit for more than 15 minutes, and the pain increases the longer he sits. He denies taking medication for pain. He denies any bowel issues but does have intermittent pain with bowel movements. He denies any new musculoskeletal or joint aches or pains. A complete review of systems is obtained and is otherwise negative.    PHYSICAL EXAM:  Wt Readings from Last 3 Encounters:  10/10/17 176 lb 6.4 oz (80 kg)  09/29/17 177 lb 8 oz (80.5  kg)  09/20/17 175 lb (79.4  kg)   Temp Readings from Last 3 Encounters:  10/10/17 98.7 F (37.1 C)  09/29/17 98.7 F (37.1 C) (Oral)  09/20/17 98.3 F (36.8 C) (Oral)   BP Readings from Last 3 Encounters:  10/10/17 129/72  09/29/17 (!) 119/57  09/20/17 (!) 118/58   Pulse Readings from Last 3 Encounters:  10/10/17 65  09/29/17 66  09/20/17 (!) 58   Pain Assessment Pain Score: 4  Pain Frequency: Constant Pain Loc: Groin(between his testicles and anus)/10  In general this is a well appearing caucasian male in no acute distress. He's alert and oriented x4 and appropriate throughout the examination. Cardiopulmonary assessment is negative for acute distress and he exhibits normal effort.    KPS = 80  100 - Normal; no complaints; no evidence of disease. 90   - Able to carry on normal activity; minor signs or symptoms of disease. 80   - Normal activity with effort; some signs or symptoms of disease. 29   - Cares for self; unable to carry on normal activity or to do active work. 60   - Requires occasional assistance, but is able to care for most of his personal needs. 50   - Requires considerable assistance and frequent medical care. 36   - Disabled; requires special care and assistance. 56   - Severely disabled; hospital admission is indicated although death not imminent. 25   - Very sick; hospital admission necessary; active supportive treatment necessary. 10   - Moribund; fatal processes progressing rapidly. 0     - Dead  Karnofsky DA, Abelmann Lovelock, Craver LS and Burchenal [facility] [phone]) The use of the nitrogen mustards in the palliative treatment of carcinoma: with particular reference to bronchogenic carcinoma Cancer 1 634-56  LABORATORY DATA:  Lab Results  Component Value Date   WBC 6.8 01/26/2017   HGB 11.1 (L) 02/05/2017   HCT 33.0 (L) 02/05/2017   MCV 91.7 01/26/2017   PLT 241.0 01/26/2017   Lab Results  Component Value Date   NA 140 03/03/2017   K 4.4 03/03/2017   CL 106 03/03/2017   CO2 27  03/03/2017   Lab Results  Component Value Date   ALT 15 01/26/2017   AST 18 01/26/2017   ALKPHOS 60 01/26/2017   BILITOT 0.7 01/26/2017     RADIOGRAPHY: No results found.    IMPRESSION/PLAN: 1. 84 y.o. gentleman with locally advanced prostate cancer with a history of T1c Gleason 3+4 disease with surge in PSA to 20 range, castrate sensitive, on Lupron with a recent PSA in the 4 range. Today, we talked to the patient and his daughter about the findings and work-up thus far.  We discussed the natural history of prostate cancer and general treatment, highlighting the role of external beam radiotherapy in the management.  We discussed the available radiation techniques, and focused on the details of logistics and delivery.  We anticipate about 4 weeks of radiation treatment to the pelvis to help palliate his symptoms.  We reviewed the anticipated acute and late sequelae associated with radiation in this setting.  The patient was encouraged to ask questions that we answered to the best of our abilities.  We agree with additional diagnostic CT imaging that Dr. Alen Blew plans to order. Once he undergoes cystoscopic evaluation and possible TURP with Dr. Harlow Asa on 10/12/17, we will plan to coordinate  CT simulation prior to beginning radiation treatment. He will continue Lupron and is not due for his  next injection until October 2019. He does not need fiducial markers for his treatment.   In a visit lasting 60 minutes, greater than 50% of the time was spent face to face discussing his case, and coordinating the patient's care.    Carola Rhine, [facility]   [name]    Seen with _____________________________________  Sheral Apley Tammi Klippel, M.D.  This document serves as a record of services personally performed by Tyler Pita, MD and Shona Simpson, PA-C. It was created on their behalf by Rae Lips, a trained medical scribe. The creation of this record is based on the scribe's personal  observations and the providers' statements to them. This document has been checked and approved by the attending providers.

## 2017-10-10 NOTE — Progress Notes (Signed)
See progress note under physician encounter. 

## 2017-10-11 DIAGNOSIS — R9431 Abnormal electrocardiogram [ECG] [EKG]: Secondary | ICD-10-CM | POA: Diagnosis not present

## 2017-10-12 DIAGNOSIS — N35919 Unspecified urethral stricture, male, unspecified site: Secondary | ICD-10-CM | POA: Diagnosis not present

## 2017-10-12 DIAGNOSIS — R339 Retention of urine, unspecified: Secondary | ICD-10-CM | POA: Diagnosis not present

## 2017-10-12 DIAGNOSIS — N35912 Unspecified bulbous urethral stricture, male: Secondary | ICD-10-CM | POA: Diagnosis not present

## 2017-10-12 DIAGNOSIS — R338 Other retention of urine: Secondary | ICD-10-CM | POA: Diagnosis not present

## 2017-10-12 DIAGNOSIS — R319 Hematuria, unspecified: Secondary | ICD-10-CM | POA: Diagnosis not present

## 2017-10-12 DIAGNOSIS — C61 Malignant neoplasm of prostate: Secondary | ICD-10-CM | POA: Diagnosis not present

## 2017-10-12 DIAGNOSIS — N401 Enlarged prostate with lower urinary tract symptoms: Secondary | ICD-10-CM | POA: Diagnosis not present

## 2017-10-12 DIAGNOSIS — N135 Crossing vessel and stricture of ureter without hydronephrosis: Secondary | ICD-10-CM | POA: Diagnosis not present

## 2017-10-13 ENCOUNTER — Telehealth: Payer: Self-pay | Admitting: Radiation Oncology

## 2017-10-13 ENCOUNTER — Encounter: Payer: Self-pay | Admitting: Internal Medicine

## 2017-10-13 NOTE — Telephone Encounter (Signed)
Phoned patient as requested by Shona Simpson, PA-C. No answer. Left message explaining his simulation appointment will be pushed back until after 10/26/2017 to allow time for him to heal from his procedure done Wednesday (8/21). Left my contact information for future questions.

## 2017-10-18 ENCOUNTER — Encounter (HOSPITAL_COMMUNITY): Payer: Self-pay

## 2017-10-18 ENCOUNTER — Ambulatory Visit (HOSPITAL_COMMUNITY)
Admission: RE | Admit: 2017-10-18 | Discharge: 2017-10-18 | Disposition: A | Discharge status: Home / Self Care | Payer: Medicare HMO | Source: Ambulatory Visit | Attending: Oncology | Admitting: Oncology

## 2017-10-18 DIAGNOSIS — K8020 Calculus of gallbladder without cholecystitis without obstruction: Secondary | ICD-10-CM | POA: Insufficient documentation

## 2017-10-18 DIAGNOSIS — N1330 Unspecified hydronephrosis: Secondary | ICD-10-CM | POA: Diagnosis not present

## 2017-10-18 DIAGNOSIS — J439 Emphysema, unspecified: Secondary | ICD-10-CM | POA: Insufficient documentation

## 2017-10-18 DIAGNOSIS — K449 Diaphragmatic hernia without obstruction or gangrene: Secondary | ICD-10-CM | POA: Diagnosis not present

## 2017-10-18 DIAGNOSIS — I700 Atherosclerosis of aorta: Secondary | ICD-10-CM | POA: Diagnosis not present

## 2017-10-18 DIAGNOSIS — N131 Hydronephrosis with ureteral stricture, not elsewhere classified: Secondary | ICD-10-CM | POA: Diagnosis not present

## 2017-10-18 DIAGNOSIS — C61 Malignant neoplasm of prostate: Secondary | ICD-10-CM | POA: Insufficient documentation

## 2017-10-19 ENCOUNTER — Ambulatory Visit: Payer: Medicare HMO | Admitting: Radiation Oncology

## 2017-10-20 ENCOUNTER — Telehealth: Payer: Self-pay | Admitting: *Deleted

## 2017-10-20 NOTE — Telephone Encounter (Signed)
The results of the scan was discussed with the patient over the phone on 10/19/2017.

## 2017-10-20 NOTE — Telephone Encounter (Signed)
Received voice mail message from patient stating,"I had a CT scan on Tuesday, and I would like to get the results. My return number is [phone]. "

## 2017-10-27 ENCOUNTER — Encounter: Payer: Self-pay | Admitting: Medical Oncology

## 2017-10-27 ENCOUNTER — Ambulatory Visit
Admission: RE | Admit: 2017-10-27 | Discharge: 2017-10-27 | Disposition: A | Discharge status: Home / Self Care | Payer: Medicare HMO | Source: Ambulatory Visit | Attending: Radiation Oncology | Admitting: Radiation Oncology

## 2017-10-27 DIAGNOSIS — Z510 Encounter for antineoplastic radiation therapy: Secondary | ICD-10-CM | POA: Insufficient documentation

## 2017-10-27 DIAGNOSIS — C61 Malignant neoplasm of prostate: Secondary | ICD-10-CM | POA: Insufficient documentation

## 2017-10-29 NOTE — Progress Notes (Signed)
  Radiation Oncology         [phone]) [phone] ________________________________  Name: [name]. MRN: [id number]  Date: 10/27/2017  DOB: 04-Dec-1927  SIMULATION AND TREATMENT PLANNING NOTE    ICD-10-CM   1. ADENOCARCINOMA, PROSTATE, GLEASON GRADE 3 C61     DIAGNOSIS:  84 y.o. gentleman with progressive locally advanced prostate cancer  NARRATIVE:  The patient was brought to the [facility].  Identity was confirmed.  All relevant records and images related to the planned course of therapy were reviewed.  The patient freely provided informed written consent to proceed with treatment after reviewing the details related to the planned course of therapy. The consent form was witnessed and verified by the simulation staff.  Then, the patient was set-up in a stable reproducible supine position for radiation therapy.  A vacuum lock pillow device was custom fabricated to position his legs in a reproducible immobilized position.  Then, I performed a urethrogram under sterile conditions to identify the prostatic apex.  CT images were obtained.  Surface markings were placed.  The CT images were loaded into the planning software.  Then the prostate target and avoidance structures including the rectum, bladder, bowel and hips were contoured.  Treatment planning then occurred.  The radiation prescription was entered and confirmed.  A total of one complex treatment devices was fabricated. I have requested : Intensity Modulated Radiotherapy (IMRT) is medically necessary for this case for the following reason:  Rectal sparing.[name]  PLAN:  The patient will receive 44 Gy in 20 fractions to the pelvic nodal target with simultaneous integrated boost to the prostate and left pelvic mass to 60 Gy in 20 fractions.  ________________________________  Sheral Apley Tammi Klippel, M.D.

## 2017-10-31 ENCOUNTER — Telehealth: Payer: Self-pay | Admitting: Radiation Oncology

## 2017-10-31 NOTE — Progress Notes (Signed)
Received voicemail message from patient's daughter, Ms. Freda Munro, requested a return call. Phoned back. She questioned the number of radiation treatments Dr. Tammi Klippel plans to administer. Read the following to Ms. Chapman:The patient will receive 44 Gy in 20 fractions to the pelvic nodal target with simultaneous integrated boost to the prostate and left pelvic mass to 60 Gy in 20 fractions. She questions why its changed from 20 tx to 104. Also, she questions why her fathers treatment has been pushed back from 9/16 to 9/23. She reports her father is struggling to determine if the benefits of radiation therapy will outweigh the fatigue he feels and the $100 copay he has to pay for each visit. Ms. Freda Munro understands this information will be relayed to the providers and someone from this office will phone her back.

## 2017-11-02 ENCOUNTER — Telehealth: Payer: Self-pay | Admitting: Radiation Oncology

## 2017-11-02 NOTE — Progress Notes (Signed)
Phoned patient. Spoke with patient and his daughter over speaker. Explained that per Dr. Tammi Klippel he will have 20 radiation treatments not 10 or 40. Explained his start date was pushed back to allow him more time to heal from his TURP procedure. Explained that a Development worker, community from this office would reach out to discuss concerns he had about insurance, co pay, etc. Finally, explained the treatment therapist will be in contact with him to discuss moving his October 16th treatment appointment from the morning to the afternoon since it interferes with his dental appointment and his October 31st appointment. Understanding verbalized by both and appreciation for the return call expressed.

## 2017-11-03 DIAGNOSIS — C61 Malignant neoplasm of prostate: Secondary | ICD-10-CM | POA: Diagnosis not present

## 2017-11-07 ENCOUNTER — Ambulatory Visit: Payer: Medicare HMO

## 2017-11-07 ENCOUNTER — Encounter: Payer: Self-pay | Admitting: Internal Medicine

## 2017-11-07 ENCOUNTER — Ambulatory Visit: Payer: Medicare HMO | Admitting: Internal Medicine

## 2017-11-07 VITALS — BP 128/60 | HR 66 | Ht 69.0 in | Wt 176.0 lb

## 2017-11-07 DIAGNOSIS — I498 Other specified cardiac arrhythmias: Secondary | ICD-10-CM

## 2017-11-07 DIAGNOSIS — E782 Mixed hyperlipidemia: Secondary | ICD-10-CM

## 2017-11-07 DIAGNOSIS — I480 Paroxysmal atrial fibrillation: Secondary | ICD-10-CM

## 2017-11-07 DIAGNOSIS — I493 Ventricular premature depolarization: Secondary | ICD-10-CM | POA: Diagnosis not present

## 2017-11-07 DIAGNOSIS — I2510 Atherosclerotic heart disease of native coronary artery without angina pectoris: Secondary | ICD-10-CM

## 2017-11-07 MED ORDER — ASPIRIN EC 81 MG PO TBEC: 81.0000 mg | DELAYED_RELEASE_TABLET | Freq: Every day | ORAL | 3 refills | Status: DC

## 2017-11-07 MED ORDER — METOPROLOL TARTRATE 25 MG PO TABS: 12.5000 mg | ORAL_TABLET | Freq: Two times a day (BID) | ORAL | 3 refills | Status: DC

## 2017-11-07 NOTE — Patient Instructions (Signed)
Your physician has recommended you make the following change in your medication:  1.) decrease aspirin to 81 mg once a day (stop 325 mg tablet) 2.) decrease metoprolol 12.5 mg twice a day  Your physician recommends that you return for lab work today [facility])  Your physician has requested that you have an echocardiogram. Echocardiography is a painless test that uses sound waves to create images of your heart. It provides your doctor with information about the size and shape of your heart and how well your heart's chambers and valves are working. This procedure takes approximately one hour. There are no restrictions for this procedure.  Your physician has recommended that you wear a holter monitor. Holter monitors are medical devices that record the heart's electrical activity. Doctors most often use these monitors to diagnose arrhythmias. Arrhythmias are problems with the speed or rhythm of the heartbeat. The monitor is a small, portable device. You can wear one while you do your normal daily activities. This is usually used to diagnose what is causing palpitations/syncope (passing out).

## 2017-11-07 NOTE — Progress Notes (Signed)
Cardiology Office Note   Date:  11/07/2017   ID:  Luis Buba., DOB Jul 10, 1927, MRN [id number]  PCP:  [name], MD  Cardiologist:   Dorris Carnes, MD   No chief complaint on file.  F/U of CAD   History of Present Illness: Luis Essner. is a 84 y.o. male with a history ofPAF, HTN, HL,  hypothyroidism, prostate CA. and CAD   IN 2015. LHC demonstrated 3 v CAD and was referred for CABG + MAZE procedure. Intraoperative TEE demonstrated MVP with mod to severe MR. He underwent CABG (L-LAD, S-OM1), MV repair and MAZE with cryothermy ablation clipping of LAA. Post op course c/b AFib and was placed on Amiodarone. Carotid US (02/27/13): 1-39% bilateral.    I saw him in [place]g 2018  SInce seen he denies CP  Breathing is OK   Denies palpitations   No dizzienss   Current Outpatient Medications  Medication Sig Dispense Refill  . amoxicillin (AMOXIL) 500 MG capsule TAKE 4 CAPSULES BY MOUTH 1 HOUR prior TO dental appointment.  1  . aspirin 325 MG tablet Take 325 mg by mouth daily.    [name] atorvastatin (LIPITOR) 10 MG tablet TAKE 1 TABLET BY MOUTH EVERY DAY AT 6:00 PM 90 tablet 3  . Cholecalciferol (VITAMIN D3 PO) Take 2,000 Units by mouth daily.    . fish oil-omega-3 fatty acids 1000 MG capsule Take 1 g by mouth every morning.     [name] Leuprolide Acetate, 6 Month, (LUPRON) 45 MG injection Inject into the muscle.    . levothyroxine (SYNTHROID, LEVOTHROID) 50 MCG tablet Take 1 tablet (50 mcg total) by mouth every morning. 90 tablet 3  . meclizine (ANTIVERT) 25 MG tablet Take 1 tablet (25 mg total) by mouth 3 (three) times daily as needed. 20 tablet 0  . metoprolol tartrate (LOPRESSOR) 25 MG tablet Take 1 tablet (25 mg total) by mouth 2 (two) times daily. 180 tablet 3  . nitroGLYCERIN (NITROSTAT) 0.4 MG SL tablet Place 1 tablet (0.4 mg total) under the tongue every 5 (five) minutes as needed for chest pain. 25 tablet 3  . vitamin C (ASCORBIC ACID) 500 MG tablet Take 500 mg by mouth every  morning.      No current facility-administered medications for this visit.     Allergies:   Other   Past Medical History:  Diagnosis Date  . Complication of anesthesia    "serious cognisance problems since my OR in January" (03/13/2013  . Coronary artery disease   . Hx of echocardiogram    Echo 5/16:  Mild focal basal septal hypertrophy, EF 55%, mild AI, MV repair ok with trivial MR (mean 3 mmHg), mild LAE, mild RAE, PASP 35 mmHg  . Hypertension   . Irritable bowel syndrome   . PAF (paroxysmal atrial fibrillation) ([place]) 05/03/2014  . Personal history of colonic polyps    tubular adenoma  . Pleural effusion, bilateral 03/12/2013   Small to moderate, L>R  . Prostate cancer [facility]) 1991   sees Dr. Carlota Raspberry at [facility], observation only   . S/P CABG x 2 02/28/2013   LIMA to LAD, SVG to OM1, EVH via right thigh  . S/P Maze operation for atrial fibrillation 02/28/2013   Complete bilateral atrial lesion set using bipolar radiofrequency and cryothermy ablation with clipping of LA appendage  . S/P mitral valve repair, maze procedure, and CABG x2 02/28/2013   Complex valvuloplasty including triangular resection of posterior leaflet, 30 mm Sorin Memo  3D ring annuloplasty    Past Surgical History:  Procedure Laterality Date  . CARDIAC CATHETERIZATION  2015  . CATARACT EXTRACTION, BILATERAL Bilateral 2014   per Dr. Gershon Crane   . COLONOSCOPY  2011   per Dr. Sharlett Iles, benign polyps, no repeats needed   . CORONARY ARTERY BYPASS GRAFT N/A 02/28/2013   Procedure: CORONARY ARTERY BYPASS GRAFTING (CABG);  Surgeon: Rexene Alberts, MD;  Location: [facility];  Service: Open Heart Surgery;  Laterality: N/A;  CABG x 2,  using left internal mammary artery and right leg saphenous vein harvested endoscopically  . HEMORRHOID SURGERY    . INTRAOPERATIVE TRANSESOPHAGEAL ECHOCARDIOGRAM N/A 02/28/2013   Procedure: INTRAOPERATIVE TRANSESOPHAGEAL ECHOCARDIOGRAM;  Surgeon: Rexene Alberts, MD;  Location: [place];  Service: Open Heart  Surgery;  Laterality: N/A;  . LEFT HEART CATHETERIZATION WITH CORONARY ANGIOGRAM N/A 02/26/2013   Procedure: LEFT HEART CATHETERIZATION WITH CORONARY ANGIOGRAM;  Surgeon: Leonie Man, MD;  Location: [facility] CATH LAB;  Service: Cardiovascular;  Laterality: N/A;  . MAZE N/A 02/28/2013   Procedure: MAZE;  Surgeon: Rexene Alberts, MD;  Location: [place];  Service: Open Heart Surgery;  Laterality: N/A;  . MITRAL VALVE REPAIR N/A 02/28/2013   Procedure: MITRAL VALVE REPAIR (MVR);  Surgeon: Rexene Alberts, MD;  Location: [place];  Service: Open Heart Surgery;  Laterality: N/A;  . PROSTATE BIOPSY    . TRANSURETHRAL RESECTION OF PROSTATE N/A 02/04/2017   Procedure: TRANSURETHRAL RESECTION OF THE PROSTATE (TURP);  Surgeon: Alexis Frock, MD;  Location: [facility];  Service: Urology;  Laterality: N/A;     Social History:  The patient  reports that he has never smoked. He has never used smokeless tobacco. He reports that he does not drink alcohol or use drugs.   Family History:  The patient's family history includes Cancer in his brother and daughter; Diabetes in his sister and unknown relative; Heart disease in his son and unknown relative; Hypertension in his father, mother, and unknown relative; Lung cancer in his brother; Stomach cancer in his brother; Stroke in his unknown relative; Sudden death in his father, mother, and unknown relative.    ROS:  Please see the history of present illness. All other systems are reviewed and  Negative to the above problem except as noted.    PHYSICAL EXAM: VS:  BP 128/60   Pulse 66   Ht 5\' 9"  (1.753 m)   Wt 176 lb (79.8 kg)   BMI 25.99 kg/m   GEN: Well nourished, well developed, in no acute distress  HEENT: normal  Neck: JVP is normal  No carotid bruits, or masses Cardiac:RRR with frequent skips  ; no murmurs, rubs, or gallops,no edema  Respiratory:  clear to auscultation bilaterally, normal work of breathing GI: soft, nontender, nondistended, + BS  No hepatomegaly    MS: no deformity Moving all extremities   Skin: warm and dry, no rash Neuro:  Strength and sensation are intact Psych: euthymic mood, full affect   EKG:  EKG is  ordered today. Junctional rhythm   Ventricular bigeminy    66 bpm   Lipid Panel    Component Value Date/Time   CHOL 111 01/26/2017 1103   TRIG 108.0 01/26/2017 1103   HDL 33.10 (L) 01/26/2017 1103   CHOLHDL 3 01/26/2017 1103   VLDL 21.6 01/26/2017 1103   LDLCALC 56 01/26/2017 1103   LDLDIRECT 159.3 05/17/2007 0845      Wt Readings from Last 3 Encounters:  11/07/17 176 lb (79.8  kg)  10/10/17 176 lb 6.4 oz (80 kg)  09/29/17 177 lb 8 oz (80.5 kg)      ASSESSMENT AND PLAN:  1  PVCs   Will set up for echo to reeval LVEF  Set up for holtermonitor  Check TSH  .  CHeck echo   Cut back on metorprolol to 12.5 bid   2  CAD   I am not convinced of active angina   Await test results  3  MV disease   No murmur on exame   3.  Afib  S/p MAZE and LAA clipping  NO symptomatic recurrences  COntinue metoprolol   4  HL  Good on statin  Last LDL in Dec 2018 was 56       5  Dizziness Denies     Disposition:   F/U based on test results      Signed, Dorris Carnes, MD  11/07/2017 2:49 PM    [facility] [address], [place], [place]  [address] Phone: [phone]; Fax: [phone]

## 2017-11-08 ENCOUNTER — Ambulatory Visit: Payer: Medicare HMO

## 2017-11-08 LAB — TSH: TSH: 3.14 u[IU]/mL (ref 0.450–4.500)

## 2017-11-09 ENCOUNTER — Ambulatory Visit: Payer: Medicare HMO

## 2017-11-10 ENCOUNTER — Ambulatory Visit: Payer: Medicare HMO

## 2017-11-11 ENCOUNTER — Ambulatory Visit: Payer: Medicare HMO

## 2017-11-11 DIAGNOSIS — C61 Malignant neoplasm of prostate: Secondary | ICD-10-CM | POA: Diagnosis not present

## 2017-11-11 DIAGNOSIS — Z510 Encounter for antineoplastic radiation therapy: Secondary | ICD-10-CM | POA: Diagnosis not present

## 2017-11-14 ENCOUNTER — Ambulatory Visit (INDEPENDENT_AMBULATORY_CARE_PROVIDER_SITE_OTHER): Payer: Medicare HMO

## 2017-11-14 ENCOUNTER — Encounter: Payer: Self-pay | Admitting: Medical Oncology

## 2017-11-14 ENCOUNTER — Ambulatory Visit
Admission: RE | Admit: 2017-11-14 | Discharge: 2017-11-14 | Disposition: A | Discharge status: Home / Self Care | Payer: Medicare HMO | Source: Ambulatory Visit | Attending: Radiation Oncology | Admitting: Radiation Oncology

## 2017-11-14 ENCOUNTER — Ambulatory Visit (HOSPITAL_COMMUNITY): Payer: Medicare HMO | Attending: Cardiovascular Disease

## 2017-11-14 ENCOUNTER — Other Ambulatory Visit: Payer: Self-pay

## 2017-11-14 DIAGNOSIS — I959 Hypotension, unspecified: Secondary | ICD-10-CM | POA: Insufficient documentation

## 2017-11-14 DIAGNOSIS — I517 Cardiomegaly: Secondary | ICD-10-CM | POA: Diagnosis not present

## 2017-11-14 DIAGNOSIS — R55 Syncope and collapse: Secondary | ICD-10-CM | POA: Diagnosis not present

## 2017-11-14 DIAGNOSIS — I498 Other specified cardiac arrhythmias: Secondary | ICD-10-CM

## 2017-11-14 DIAGNOSIS — I110 Hypertensive heart disease with heart failure: Secondary | ICD-10-CM | POA: Insufficient documentation

## 2017-11-14 DIAGNOSIS — Z951 Presence of aortocoronary bypass graft: Secondary | ICD-10-CM | POA: Diagnosis not present

## 2017-11-14 DIAGNOSIS — E785 Hyperlipidemia, unspecified: Secondary | ICD-10-CM | POA: Diagnosis not present

## 2017-11-14 DIAGNOSIS — I493 Ventricular premature depolarization: Secondary | ICD-10-CM

## 2017-11-14 DIAGNOSIS — Z510 Encounter for antineoplastic radiation therapy: Secondary | ICD-10-CM | POA: Diagnosis not present

## 2017-11-14 DIAGNOSIS — I4891 Unspecified atrial fibrillation: Secondary | ICD-10-CM | POA: Diagnosis not present

## 2017-11-14 DIAGNOSIS — I509 Heart failure, unspecified: Secondary | ICD-10-CM | POA: Diagnosis not present

## 2017-11-14 DIAGNOSIS — C61 Malignant neoplasm of prostate: Secondary | ICD-10-CM | POA: Diagnosis not present

## 2017-11-14 DIAGNOSIS — I2510 Atherosclerotic heart disease of native coronary artery without angina pectoris: Secondary | ICD-10-CM | POA: Insufficient documentation

## 2017-11-15 ENCOUNTER — Ambulatory Visit
Admission: RE | Admit: 2017-11-15 | Discharge: 2017-11-15 | Disposition: A | Discharge status: Home / Self Care | Payer: Medicare HMO | Source: Ambulatory Visit | Attending: Radiation Oncology | Admitting: Radiation Oncology

## 2017-11-15 DIAGNOSIS — C61 Malignant neoplasm of prostate: Secondary | ICD-10-CM | POA: Diagnosis not present

## 2017-11-15 DIAGNOSIS — Z510 Encounter for antineoplastic radiation therapy: Secondary | ICD-10-CM | POA: Diagnosis not present

## 2017-11-16 ENCOUNTER — Ambulatory Visit
Admission: RE | Admit: 2017-11-16 | Discharge: 2017-11-16 | Disposition: A | Discharge status: Home / Self Care | Payer: Medicare HMO | Source: Ambulatory Visit | Attending: Radiation Oncology | Admitting: Radiation Oncology

## 2017-11-16 DIAGNOSIS — C61 Malignant neoplasm of prostate: Secondary | ICD-10-CM | POA: Diagnosis not present

## 2017-11-16 DIAGNOSIS — Z510 Encounter for antineoplastic radiation therapy: Secondary | ICD-10-CM | POA: Diagnosis not present

## 2017-11-17 ENCOUNTER — Ambulatory Visit
Admission: RE | Admit: 2017-11-17 | Discharge: 2017-11-17 | Disposition: A | Discharge status: Home / Self Care | Payer: Medicare HMO | Source: Ambulatory Visit | Attending: Radiation Oncology | Admitting: Radiation Oncology

## 2017-11-17 DIAGNOSIS — Z510 Encounter for antineoplastic radiation therapy: Secondary | ICD-10-CM | POA: Diagnosis not present

## 2017-11-17 DIAGNOSIS — C61 Malignant neoplasm of prostate: Secondary | ICD-10-CM | POA: Diagnosis not present

## 2017-11-18 ENCOUNTER — Ambulatory Visit (INDEPENDENT_AMBULATORY_CARE_PROVIDER_SITE_OTHER): Payer: Medicare HMO | Admitting: *Deleted

## 2017-11-18 ENCOUNTER — Ambulatory Visit
Admission: RE | Admit: 2017-11-18 | Discharge: 2017-11-18 | Disposition: A | Discharge status: Home / Self Care | Payer: Medicare HMO | Source: Ambulatory Visit | Attending: Radiation Oncology | Admitting: Radiation Oncology

## 2017-11-18 DIAGNOSIS — Z23 Encounter for immunization: Secondary | ICD-10-CM

## 2017-11-18 DIAGNOSIS — Z510 Encounter for antineoplastic radiation therapy: Secondary | ICD-10-CM | POA: Diagnosis not present

## 2017-11-18 DIAGNOSIS — C61 Malignant neoplasm of prostate: Secondary | ICD-10-CM | POA: Diagnosis not present

## 2017-11-21 ENCOUNTER — Ambulatory Visit
Admission: RE | Admit: 2017-11-21 | Discharge: 2017-11-21 | Disposition: A | Discharge status: Home / Self Care | Payer: Medicare HMO | Source: Ambulatory Visit | Attending: Radiation Oncology | Admitting: Radiation Oncology

## 2017-11-21 DIAGNOSIS — C61 Malignant neoplasm of prostate: Secondary | ICD-10-CM | POA: Diagnosis not present

## 2017-11-21 DIAGNOSIS — Z510 Encounter for antineoplastic radiation therapy: Secondary | ICD-10-CM | POA: Diagnosis not present

## 2017-11-22 ENCOUNTER — Ambulatory Visit
Admission: RE | Admit: 2017-11-22 | Discharge: 2017-11-22 | Disposition: A | Discharge status: Home / Self Care | Payer: Medicare HMO | Source: Ambulatory Visit | Attending: Radiation Oncology | Admitting: Radiation Oncology

## 2017-11-22 ENCOUNTER — Telehealth: Payer: Self-pay | Admitting: Radiation Oncology

## 2017-11-22 DIAGNOSIS — C61 Malignant neoplasm of prostate: Secondary | ICD-10-CM | POA: Diagnosis not present

## 2017-11-22 DIAGNOSIS — Z510 Encounter for antineoplastic radiation therapy: Secondary | ICD-10-CM | POA: Diagnosis not present

## 2017-11-22 NOTE — Progress Notes (Signed)
Phoned patient as promised to inform him of PUT this Friday following radiation treatment. No answer. Left detailed information reference PUT appointment and my contact number.

## 2017-11-23 ENCOUNTER — Ambulatory Visit
Admission: RE | Admit: 2017-11-23 | Discharge: 2017-11-23 | Disposition: A | Discharge status: Home / Self Care | Payer: Medicare HMO | Source: Ambulatory Visit | Attending: Radiation Oncology | Admitting: Radiation Oncology

## 2017-11-23 DIAGNOSIS — C61 Malignant neoplasm of prostate: Secondary | ICD-10-CM | POA: Diagnosis not present

## 2017-11-23 DIAGNOSIS — Z510 Encounter for antineoplastic radiation therapy: Secondary | ICD-10-CM | POA: Diagnosis not present

## 2017-11-24 ENCOUNTER — Ambulatory Visit
Admission: RE | Admit: 2017-11-24 | Discharge: 2017-11-24 | Disposition: A | Discharge status: Home / Self Care | Payer: Medicare HMO | Source: Ambulatory Visit | Attending: Radiation Oncology | Admitting: Radiation Oncology

## 2017-11-24 DIAGNOSIS — Z510 Encounter for antineoplastic radiation therapy: Secondary | ICD-10-CM | POA: Diagnosis not present

## 2017-11-24 DIAGNOSIS — C61 Malignant neoplasm of prostate: Secondary | ICD-10-CM | POA: Diagnosis not present

## 2017-11-25 ENCOUNTER — Ambulatory Visit
Admission: RE | Admit: 2017-11-25 | Discharge: 2017-11-25 | Disposition: A | Discharge status: Home / Self Care | Payer: Medicare HMO | Source: Ambulatory Visit | Attending: Radiation Oncology | Admitting: Radiation Oncology

## 2017-11-25 DIAGNOSIS — C61 Malignant neoplasm of prostate: Secondary | ICD-10-CM | POA: Diagnosis not present

## 2017-11-25 DIAGNOSIS — Z510 Encounter for antineoplastic radiation therapy: Secondary | ICD-10-CM | POA: Diagnosis not present

## 2017-11-28 ENCOUNTER — Ambulatory Visit
Admission: RE | Admit: 2017-11-28 | Discharge: 2017-11-28 | Disposition: A | Discharge status: Home / Self Care | Payer: Medicare HMO | Source: Ambulatory Visit | Attending: Radiation Oncology | Admitting: Radiation Oncology

## 2017-11-28 DIAGNOSIS — Z510 Encounter for antineoplastic radiation therapy: Secondary | ICD-10-CM | POA: Diagnosis not present

## 2017-11-28 DIAGNOSIS — C61 Malignant neoplasm of prostate: Secondary | ICD-10-CM | POA: Diagnosis not present

## 2017-11-29 ENCOUNTER — Ambulatory Visit
Admission: RE | Admit: 2017-11-29 | Discharge: 2017-11-29 | Disposition: A | Discharge status: Home / Self Care | Payer: Medicare HMO | Source: Ambulatory Visit | Attending: Radiation Oncology | Admitting: Radiation Oncology

## 2017-11-29 DIAGNOSIS — C61 Malignant neoplasm of prostate: Secondary | ICD-10-CM | POA: Diagnosis not present

## 2017-11-29 DIAGNOSIS — Z510 Encounter for antineoplastic radiation therapy: Secondary | ICD-10-CM | POA: Diagnosis not present

## 2017-11-30 ENCOUNTER — Telehealth: Payer: Self-pay

## 2017-11-30 ENCOUNTER — Ambulatory Visit
Admission: RE | Admit: 2017-11-30 | Discharge: 2017-11-30 | Disposition: A | Discharge status: Home / Self Care | Payer: Medicare HMO | Source: Ambulatory Visit | Attending: Radiation Oncology | Admitting: Radiation Oncology

## 2017-11-30 DIAGNOSIS — C61 Malignant neoplasm of prostate: Secondary | ICD-10-CM | POA: Diagnosis not present

## 2017-11-30 DIAGNOSIS — Z510 Encounter for antineoplastic radiation therapy: Secondary | ICD-10-CM | POA: Diagnosis not present

## 2017-11-30 NOTE — Telephone Encounter (Signed)
Patient returning call for [facility] for results

## 2017-11-30 NOTE — Telephone Encounter (Signed)
Spoke with patient re: holter and echo.

## 2017-11-30 NOTE — Telephone Encounter (Signed)
Best number to call is [phone]

## 2017-12-01 ENCOUNTER — Ambulatory Visit
Admission: RE | Admit: 2017-12-01 | Discharge: 2017-12-01 | Disposition: A | Discharge status: Home / Self Care | Payer: Medicare HMO | Source: Ambulatory Visit | Attending: Radiation Oncology | Admitting: Radiation Oncology

## 2017-12-01 DIAGNOSIS — Z510 Encounter for antineoplastic radiation therapy: Secondary | ICD-10-CM | POA: Diagnosis not present

## 2017-12-01 DIAGNOSIS — C61 Malignant neoplasm of prostate: Secondary | ICD-10-CM | POA: Diagnosis not present

## 2017-12-02 ENCOUNTER — Ambulatory Visit
Admission: RE | Admit: 2017-12-02 | Discharge: 2017-12-02 | Disposition: A | Discharge status: Home / Self Care | Payer: Medicare HMO | Source: Ambulatory Visit | Attending: Radiation Oncology | Admitting: Radiation Oncology

## 2017-12-02 DIAGNOSIS — C61 Malignant neoplasm of prostate: Secondary | ICD-10-CM | POA: Diagnosis not present

## 2017-12-02 DIAGNOSIS — Z510 Encounter for antineoplastic radiation therapy: Secondary | ICD-10-CM | POA: Diagnosis not present

## 2017-12-02 DIAGNOSIS — N35912 Unspecified bulbous urethral stricture, male: Secondary | ICD-10-CM | POA: Diagnosis not present

## 2017-12-05 ENCOUNTER — Ambulatory Visit
Admission: RE | Admit: 2017-12-05 | Discharge: 2017-12-05 | Disposition: A | Discharge status: Home / Self Care | Payer: Medicare HMO | Source: Ambulatory Visit | Attending: Radiation Oncology | Admitting: Radiation Oncology

## 2017-12-05 DIAGNOSIS — C61 Malignant neoplasm of prostate: Secondary | ICD-10-CM | POA: Diagnosis not present

## 2017-12-05 DIAGNOSIS — Z510 Encounter for antineoplastic radiation therapy: Secondary | ICD-10-CM | POA: Diagnosis not present

## 2017-12-06 ENCOUNTER — Ambulatory Visit
Admission: RE | Admit: 2017-12-06 | Discharge: 2017-12-06 | Disposition: A | Discharge status: Home / Self Care | Payer: Medicare HMO | Source: Ambulatory Visit | Attending: Radiation Oncology | Admitting: Radiation Oncology

## 2017-12-06 DIAGNOSIS — C61 Malignant neoplasm of prostate: Secondary | ICD-10-CM | POA: Diagnosis not present

## 2017-12-06 DIAGNOSIS — Z510 Encounter for antineoplastic radiation therapy: Secondary | ICD-10-CM | POA: Diagnosis not present

## 2017-12-07 ENCOUNTER — Ambulatory Visit
Admission: RE | Admit: 2017-12-07 | Discharge: 2017-12-07 | Disposition: A | Discharge status: Home / Self Care | Payer: Medicare HMO | Source: Ambulatory Visit | Attending: Radiation Oncology | Admitting: Radiation Oncology

## 2017-12-07 DIAGNOSIS — R69 Illness, unspecified: Secondary | ICD-10-CM | POA: Diagnosis not present

## 2017-12-07 DIAGNOSIS — Z510 Encounter for antineoplastic radiation therapy: Secondary | ICD-10-CM | POA: Diagnosis not present

## 2017-12-07 DIAGNOSIS — C61 Malignant neoplasm of prostate: Secondary | ICD-10-CM | POA: Diagnosis not present

## 2017-12-08 ENCOUNTER — Ambulatory Visit
Admission: RE | Admit: 2017-12-08 | Discharge: 2017-12-08 | Disposition: A | Discharge status: Home / Self Care | Payer: Medicare HMO | Source: Ambulatory Visit | Attending: Radiation Oncology | Admitting: Radiation Oncology

## 2017-12-08 DIAGNOSIS — C61 Malignant neoplasm of prostate: Secondary | ICD-10-CM | POA: Diagnosis not present

## 2017-12-08 DIAGNOSIS — Z510 Encounter for antineoplastic radiation therapy: Secondary | ICD-10-CM | POA: Diagnosis not present

## 2017-12-09 ENCOUNTER — Other Ambulatory Visit: Payer: Self-pay | Admitting: Radiation Oncology

## 2017-12-09 ENCOUNTER — Encounter: Payer: Self-pay | Admitting: Radiation Oncology

## 2017-12-09 ENCOUNTER — Encounter: Payer: Self-pay | Admitting: Medical Oncology

## 2017-12-09 ENCOUNTER — Ambulatory Visit: Payer: Medicare HMO

## 2017-12-09 ENCOUNTER — Ambulatory Visit
Admission: RE | Admit: 2017-12-09 | Discharge: 2017-12-09 | Disposition: A | Discharge status: Home / Self Care | Payer: Medicare HMO | Source: Ambulatory Visit | Attending: Radiation Oncology | Admitting: Radiation Oncology

## 2017-12-09 DIAGNOSIS — Z510 Encounter for antineoplastic radiation therapy: Secondary | ICD-10-CM | POA: Diagnosis not present

## 2017-12-09 DIAGNOSIS — C61 Malignant neoplasm of prostate: Secondary | ICD-10-CM | POA: Diagnosis not present

## 2017-12-09 MED ORDER — DIPHENOXYLATE-ATROPINE 2.5-0.025 MG PO TABS: 1.0000 | ORAL_TABLET | Freq: Four times a day (QID) | ORAL | 5 refills | Status: DC | PRN

## 2017-12-09 NOTE — Progress Notes (Signed)
Celebrated with Mr. Commisso and his family as he rang the bell after completing radiation. He has tolerated the treatments well but feeling fatigued. He has follow up with Ashlyn 11/20. He is scheduled with Dr. Alen Blew 10/31. I will continue to follow.

## 2017-12-12 ENCOUNTER — Ambulatory Visit: Payer: Medicare HMO

## 2017-12-13 ENCOUNTER — Ambulatory Visit: Payer: Medicare HMO

## 2017-12-14 ENCOUNTER — Ambulatory Visit: Payer: Medicare HMO

## 2017-12-15 ENCOUNTER — Ambulatory Visit: Payer: Medicare HMO

## 2017-12-16 ENCOUNTER — Ambulatory Visit: Payer: Medicare HMO

## 2017-12-19 ENCOUNTER — Ambulatory Visit: Payer: Medicare HMO

## 2017-12-20 ENCOUNTER — Ambulatory Visit: Payer: Medicare HMO

## 2017-12-21 ENCOUNTER — Ambulatory Visit: Payer: Medicare HMO

## 2017-12-21 NOTE — Progress Notes (Signed)
  Radiation Oncology         [phone]) [phone] ________________________________  Name: [name]. MRN: [id number]  Date: 12/09/2017  DOB: [birth year]  End of Treatment Note  Diagnosis:   84 y.o. gentleman with progressive locally advanced prostate cancer     Indication for treatment:  Palliative Radiotherapy       Radiation treatment dates:   11/14/2017 to 12/09/2017  Site/dose:   The prostate was treated to 60 Gy in 20 fractions of 3 Gy  Beams/energy:   The patient was treated with IMRT using volumetric arc therapy delivering 6 MV X-rays to clockwise and counterclockwise circumferential arcs with a 90 degree collimator offset to avoid dose scalloping.  Image guidance was performed with daily cone beam CT prior to each fraction to align to gold markers in the prostate and assure proper bladder and rectal fill volumes.  Immobilization was achieved with Body[employer] custom mold.  Narrative: The patient tolerated radiation treatment relatively well.  He experienced modest fatigue and some minor urinary irritation including leakage, urgency, incomplete emptying of his bladder, and nocturia x5-6 as well as occasional diarrhea which was relieved with Lomotil prn. He denied hematuria or dysuria.    Plan: The patient has completed radiation treatment. He will return to radiation oncology clinic for routine followup in one month. I advised him to call or return sooner if he has any questions or concerns related to his recovery or treatment. ________________________________  Sheral Apley. Tammi Klippel, M.D.  This document serves as a record of services personally performed by Tyler Pita, MD. It was created on his behalf by Arlyce Harman, a trained medical scribe. The creation of this record is based on the scribe's personal observations and the provider's statements to them. This document has been checked and approved by the attending provider.

## 2017-12-22 ENCOUNTER — Ambulatory Visit: Payer: Medicare HMO

## 2017-12-22 ENCOUNTER — Inpatient Hospital Stay: Payer: Medicare HMO | Attending: Oncology | Admitting: Oncology

## 2017-12-22 ENCOUNTER — Inpatient Hospital Stay: Payer: Medicare HMO

## 2017-12-22 VITALS — BP 123/60 | HR 77 | Temp 98.4°F | Resp 17 | Ht 69.0 in | Wt 169.8 lb

## 2017-12-22 DIAGNOSIS — Z79899 Other long term (current) drug therapy: Secondary | ICD-10-CM | POA: Insufficient documentation

## 2017-12-22 DIAGNOSIS — R197 Diarrhea, unspecified: Secondary | ICD-10-CM | POA: Diagnosis not present

## 2017-12-22 DIAGNOSIS — Z7982 Long term (current) use of aspirin: Secondary | ICD-10-CM | POA: Insufficient documentation

## 2017-12-22 DIAGNOSIS — Z191 Hormone sensitive malignancy status: Secondary | ICD-10-CM | POA: Insufficient documentation

## 2017-12-22 DIAGNOSIS — R5383 Other fatigue: Secondary | ICD-10-CM | POA: Diagnosis not present

## 2017-12-22 DIAGNOSIS — C61 Malignant neoplasm of prostate: Secondary | ICD-10-CM | POA: Insufficient documentation

## 2017-12-22 DIAGNOSIS — Z923 Personal history of irradiation: Secondary | ICD-10-CM | POA: Diagnosis not present

## 2017-12-22 DIAGNOSIS — R634 Abnormal weight loss: Secondary | ICD-10-CM | POA: Diagnosis not present

## 2017-12-22 DIAGNOSIS — C7951 Secondary malignant neoplasm of bone: Secondary | ICD-10-CM | POA: Diagnosis not present

## 2017-12-22 LAB — CBC WITH DIFFERENTIAL (CANCER CENTER ONLY)
Abs Immature Granulocytes: 0.01 10*3/uL (ref 0.00–0.07)
BASOS PCT: 1 %
Basophils Absolute: 0 10*3/uL (ref 0.0–0.1)
EOS PCT: 5 %
Eosinophils Absolute: 0.2 10*3/uL (ref 0.0–0.5)
HCT: 29.8 % — ABNORMAL LOW (ref 39.0–52.0)
Hemoglobin: 9.2 g/dL — ABNORMAL LOW (ref 13.0–17.0)
Immature Granulocytes: 0 %
Lymphocytes Relative: 12 %
Lymphs Abs: 0.4 10*3/uL — ABNORMAL LOW (ref 0.7–4.0)
MCH: 27.2 pg (ref 26.0–34.0)
MCHC: 30.9 g/dL (ref 30.0–36.0)
MCV: 88.2 fL (ref 80.0–100.0)
MONO ABS: 0.4 10*3/uL (ref 0.1–1.0)
MONOS PCT: 13 %
NEUTROS ABS: 2.4 10*3/uL (ref 1.7–7.7)
Neutrophils Relative %: 69 %
PLATELETS: 121 10*3/uL — AB (ref 150–400)
RBC: 3.38 MIL/uL — AB (ref 4.22–5.81)
RDW: 16.4 % — ABNORMAL HIGH (ref 11.5–15.5)
WBC: 3.4 10*3/uL — AB (ref 4.0–10.5)
nRBC: 0 % (ref 0.0–0.2)

## 2017-12-22 LAB — CMP (CANCER CENTER ONLY)
ALBUMIN: 2.8 g/dL — AB (ref 3.5–5.0)
ALK PHOS: 65 U/L (ref 38–126)
ALT: 15 U/L (ref 0–44)
AST: 19 U/L (ref 15–41)
Anion gap: 7 (ref 5–15)
BILIRUBIN TOTAL: 0.4 mg/dL (ref 0.3–1.2)
BUN: 32 mg/dL — ABNORMAL HIGH (ref 8–23)
CALCIUM: 8.7 mg/dL — AB (ref 8.9–10.3)
CO2: 26 mmol/L (ref 22–32)
CREATININE: 1.5 mg/dL — AB (ref 0.61–1.24)
Chloride: 107 mmol/L (ref 98–111)
GFR, EST NON AFRICAN AMERICAN: 40 mL/min — AB (ref >60)
GFR, Est AFR Am: 46 mL/min — ABNORMAL LOW (ref >60)
GLUCOSE: 105 mg/dL — AB (ref 70–99)
Potassium: 4.5 mmol/L (ref 3.5–5.1)
Sodium: 140 mmol/L (ref 135–145)
TOTAL PROTEIN: 6.4 g/dL — AB (ref 6.5–8.1)

## 2017-12-22 NOTE — Progress Notes (Signed)
Hematology and Oncology Follow Up Visit  [name] [id number] 19-Jul-1927 84 y.o. 12/22/2017 11:25 AM [name], MDFry, Luis Holter, MD   Principle Diagnosis: 84 year old man with prostate cancer diagnosed in 2000 with a Gleason score 3+4 = 7.  He has developed local progression with soft tissue mass documented in December 2018 and he has castration sensitive disease.   Prior Therapy:  Active surveillance without any definitive treatment for his prostate cancer. He is status post radiation therapy total of 44 Gy in 20 fractions to the pelvic nodes as well as boost to the prostate of 60 Gy 20 fractions.  I was completed in October 2019.  Current therapy:  Androgen deprivation under the care of his urologist at [facility].  Interim History: [name] returns today for a repeat evaluation.  Since the last visit, he completed radiation therapy under the care of Dr. Tammi Klippel with few complications.  He reported some diarrhea, fatigue and have lost weight.  He also reported some dysuria which is resolving at this time.  His diarrhea has been controlled with Lomotil and currently has 1-2 loose bowel movements.  His mobility is improving and his pelvic discomfort has improved some.  His quality of life and performance status is not changed.    He does not report any headaches, blurry vision, syncope or seizures.  Denies any changes in his mentation or confusion.  Does not report any fevers, chills or sweats.  Does not report any cough, wheezing or hemoptysis.  Does not report any chest pain, palpitation, orthopnea or leg edema.  Does not report any nausea, vomiting or abdominal pain.  Does not report any hematochezia or melena. Does not report any arthralgias or myalgias.   Does not report frequency, urgency or hematuria.  Does not report any skin rashes or lesions. Does not report any lymphadenopathy or petechiae.  Does not report any changes in his mood.  He denies any bleeding or clotting  tendency.  Remaining review of systems is negative.    Medications: I have reviewed the patient's current medications.  Current Outpatient Medications  Medication Sig Dispense Refill  . amoxicillin (AMOXIL) 500 MG capsule TAKE 4 CAPSULES BY MOUTH 1 HOUR prior TO dental appointment.  1  . aspirin EC 81 MG tablet Take 1 tablet (81 mg total) by mouth daily. 90 tablet 3  . atorvastatin (LIPITOR) 10 MG tablet TAKE 1 TABLET BY MOUTH EVERY DAY AT 6:00 PM 90 tablet 3  . Cholecalciferol (VITAMIN D3 PO) Take 2,000 Units by mouth daily.    . diphenoxylate-atropine (LOMOTIL) 2.5-0.025 MG tablet Take 1 tablet by mouth 4 (four) times daily as needed for diarrhea or loose stools (at each meal time). 160 tablet 5  . fish oil-omega-3 fatty acids 1000 MG capsule Take 1 g by mouth every morning.     [name] Leuprolide Acetate, 6 Month, (LUPRON) 45 MG injection Inject into the muscle.    . levothyroxine (SYNTHROID, LEVOTHROID) 50 MCG tablet Take 1 tablet (50 mcg total) by mouth every morning. 90 tablet 3  . meclizine (ANTIVERT) 25 MG tablet Take 1 tablet (25 mg total) by mouth 3 (three) times daily as needed. 20 tablet 0  . metoprolol tartrate (LOPRESSOR) 25 MG tablet Take 0.5 tablets (12.5 mg total) by mouth 2 (two) times daily. 90 tablet 3  . nitroGLYCERIN (NITROSTAT) 0.4 MG SL tablet Place 1 tablet (0.4 mg total) under the tongue every 5 (five) minutes as needed for chest pain. 25  tablet 3  . vitamin C (ASCORBIC ACID) 500 MG tablet Take 500 mg by mouth every morning.      No current facility-administered medications for this visit.      Allergies:  Allergies  Allergen Reactions  . Other Other (See Comments)    ANESTHESIA.... 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 ANESTHESIA.... 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 Occurred post op CABG 2015, required readmission and rehab    Past Medical History, Surgical history, Social history, and Family History were reviewed and  updated.   Physical Exam: Blood pressure 123/60, pulse 77, temperature 98.4 F (36.9 C), temperature source Oral, resp. rate 17, height 5\' 9"  (1.753 m), weight 169 lb 12.8 oz (77 kg), SpO2 100 %.    ECOG:  1   General appearance: Comfortable appearing without any discomfort Head: Normocephalic without any trauma Oropharynx: Mucous membranes are moist and pink without any thrush or ulcers. Eyes: Pupils are equal and round reactive to light. Lymph nodes: No cervical, supraclavicular, inguinal or axillary lymphadenopathy.   Heart:regular rate and rhythm.  S1 and S2 without leg edema. Lung: Clear without any rhonchi or wheezes.  No dullness to percussion. Abdomin: Soft, nontender, nondistended with good bowel sounds.  No hepatosplenomegaly. Musculoskeletal: No joint deformity or effusion.  Full range of motion noted. Neurological: No deficits noted on motor, sensory and deep tendon reflex exam. Skin: No petechial rash or dryness.  Appeared moist.      Lab Results: Lab Results  Component Value Date   WBC 3.4 (L) 12/22/2017   HGB 9.2 (L) 12/22/2017   HCT 29.8 (L) 12/22/2017   MCV 88.2 12/22/2017   PLT 121 (L) 12/22/2017     Chemistry      Component Value Date/Time   NA 140 03/03/2017 1006   K 4.4 03/03/2017 1006   CL 106 03/03/2017 1006   CO2 27 03/03/2017 1006   BUN 30 (H) 03/03/2017 1006   CREATININE 1.62 (H) 03/03/2017 1006      Component Value Date/Time   CALCIUM 8.5 03/03/2017 1006   ALKPHOS 60 01/26/2017 1103   AST 18 01/26/2017 1103   ALT 15 01/26/2017 1103   BILITOT 0.7 01/26/2017 1103        Impression and Plan:     84 year old man with:  1.    Prostate cancer diagnosed in year 2000 with Gleason score is 3+4 = 7.  He developed a recurrent disease with pelvic recurrence.   He is status post definitive therapy with radiation was completed in October 2019.  CT scan obtained on October 18, 2017 was reviewed prior to his radiation and did not show any  evidence of metastatic disease.  I recommended the continued observation and surveillance moving forward and consider additional therapy if he develops castration resistant disease.  We will repeat imaging studies tentatively in March of next year.  2.  Androgen deprivation: his last Lupron was given in October 2019 at the care of his urologist in [facility].  I recommended continuing this indefinitely.  3.  Pelvic discomfort: Improved with radiation therapy slightly.  We will continue to monitor moving forward.  4.  Follow-up: We will be in 3 months for a repeat evaluation  15  minutes was spent with the patient face-to-face today.  More than 50% of time was dedicated to reviewing imaging studies, the natural course of his disease and treatment options in the future.    [name], MD 10/31/201911:25 AM

## 2017-12-23 ENCOUNTER — Telehealth: Payer: Self-pay

## 2017-12-23 ENCOUNTER — Ambulatory Visit: Payer: Medicare HMO

## 2017-12-23 LAB — PROSTATE-SPECIFIC AG, SERUM (LABCORP): Prostate Specific Ag, Serum: 0.3 ng/mL (ref 0.0–4.0)

## 2017-12-23 NOTE — Telephone Encounter (Signed)
Spoke with patient concerning his upcoming appointment. Per 10/31 los. Mailed a letter with a calender enclosed

## 2017-12-26 DIAGNOSIS — R300 Dysuria: Secondary | ICD-10-CM | POA: Diagnosis not present

## 2017-12-26 DIAGNOSIS — R339 Retention of urine, unspecified: Secondary | ICD-10-CM | POA: Diagnosis not present

## 2017-12-26 DIAGNOSIS — C61 Malignant neoplasm of prostate: Secondary | ICD-10-CM | POA: Diagnosis not present

## 2018-01-05 DIAGNOSIS — C61 Malignant neoplasm of prostate: Secondary | ICD-10-CM | POA: Diagnosis not present

## 2018-01-10 DIAGNOSIS — C61 Malignant neoplasm of prostate: Secondary | ICD-10-CM | POA: Diagnosis not present

## 2018-01-10 DIAGNOSIS — R197 Diarrhea, unspecified: Secondary | ICD-10-CM | POA: Diagnosis not present

## 2018-01-10 DIAGNOSIS — Z7982 Long term (current) use of aspirin: Secondary | ICD-10-CM | POA: Diagnosis not present

## 2018-01-10 DIAGNOSIS — Z923 Personal history of irradiation: Secondary | ICD-10-CM | POA: Diagnosis not present

## 2018-01-10 DIAGNOSIS — Z79899 Other long term (current) drug therapy: Secondary | ICD-10-CM | POA: Diagnosis not present

## 2018-01-11 ENCOUNTER — Ambulatory Visit: Payer: Self-pay | Admitting: Urology

## 2018-01-11 DIAGNOSIS — C61 Malignant neoplasm of prostate: Secondary | ICD-10-CM | POA: Diagnosis not present

## 2018-01-11 DIAGNOSIS — I10 Essential (primary) hypertension: Secondary | ICD-10-CM | POA: Diagnosis not present

## 2018-01-11 DIAGNOSIS — Z9889 Other specified postprocedural states: Secondary | ICD-10-CM | POA: Diagnosis not present

## 2018-01-11 DIAGNOSIS — N35912 Unspecified bulbous urethral stricture, male: Secondary | ICD-10-CM | POA: Diagnosis not present

## 2018-01-11 DIAGNOSIS — Z923 Personal history of irradiation: Secondary | ICD-10-CM | POA: Diagnosis not present

## 2018-01-11 DIAGNOSIS — N131 Hydronephrosis with ureteral stricture, not elsewhere classified: Secondary | ICD-10-CM | POA: Diagnosis not present

## 2018-01-11 DIAGNOSIS — N135 Crossing vessel and stricture of ureter without hydronephrosis: Secondary | ICD-10-CM | POA: Diagnosis not present

## 2018-01-16 DIAGNOSIS — C61 Malignant neoplasm of prostate: Secondary | ICD-10-CM | POA: Diagnosis not present

## 2018-01-18 ENCOUNTER — Encounter: Payer: Self-pay | Admitting: Urology

## 2018-01-18 ENCOUNTER — Other Ambulatory Visit: Payer: Self-pay

## 2018-01-18 ENCOUNTER — Ambulatory Visit
Admission: RE | Admit: 2018-01-18 | Discharge: 2018-01-18 | Disposition: A | Discharge status: Home / Self Care | Payer: Medicare HMO | Source: Ambulatory Visit | Attending: Urology | Admitting: Urology

## 2018-01-18 VITALS — BP 129/73 | HR 77 | Temp 98.5°F | Resp 20 | Ht 69.0 in | Wt 169.0 lb

## 2018-01-18 DIAGNOSIS — Z79899 Other long term (current) drug therapy: Secondary | ICD-10-CM | POA: Insufficient documentation

## 2018-01-18 DIAGNOSIS — Z923 Personal history of irradiation: Secondary | ICD-10-CM | POA: Insufficient documentation

## 2018-01-18 DIAGNOSIS — Z7982 Long term (current) use of aspirin: Secondary | ICD-10-CM | POA: Insufficient documentation

## 2018-01-18 DIAGNOSIS — C61 Malignant neoplasm of prostate: Secondary | ICD-10-CM | POA: Diagnosis not present

## 2018-01-18 NOTE — Progress Notes (Signed)
Radiation Oncology         [id number]) [phone] ________________________________  Name: [name]. MRN: [id number]  Date: 01/18/2018  DOB: November 23, 1927  Post Treatment Note  CC: [name], MD  Wyatt Portela, MD  Diagnosis:   84 y.o. gentleman with progressive locally advanced prostate cancer     Interval Since Last Radiation:  5.5 weeks  11/14/2017 to 12/09/2017: The prostate was treated to 60 Gy in 20 fractions of 3 Gy  Narrative:  The patient returns today for routine follow-up.  He tolerated radiation treatment relatively well.  He experienced modest fatigue and some minor urinary irritation including leakage, urgency, incomplete emptying of his bladder, and nocturia x5-6 as well as occasional diarrhea which was relieved with Lomotil prn. He denied hematuria or dysuria.  He remained on ADT throughout the course of his treatment which he tolerated well.                              On review of systems, the patient states that he is doing well overall with improvement in the pelvic discomfort that he was experiencing prior to radiation.  He continues with increased urinary frequency, weak, slow flow of stream, urinary incontinence and nocturia x5.  He was recently evaluated with his urologist, Dr. Ivory Broad approximately 1 week ago and had what sounds like cystoscopic urethral dilation at the time of a DJ ureteral stent exchange.  He has continued to utilize depends undergarments and changes several times throughout the day due to urinary incontinence.  He denies dysuria, gross hematuria, fever, chills or night sweats.  He reports a healthy appetite and is maintaining his weight.  He denies abdominal pain, nausea, vomiting, diarrhea or constipation.  He had a PSA checked at the time of his recent follow-up with Dr. Alen Blew on 12/22/17 and that was noted significantly improved at 0.3.  The plan at that time was to follow-up in 3 months with repeat systemic imaging prior to that  visit.  ALLERGIES:  is allergic to other.  Meds: Current Outpatient Medications  Medication Sig Dispense Refill  . aspirin EC 81 MG tablet Take 1 tablet (81 mg total) by mouth daily. 90 tablet 3  . atorvastatin (LIPITOR) 10 MG tablet TAKE 1 TABLET BY MOUTH EVERY DAY AT 6:00 PM 90 tablet 3  . Cholecalciferol (VITAMIN D3 PO) Take 2,000 Units by mouth daily.    . diphenoxylate-atropine (LOMOTIL) 2.5-0.025 MG tablet Take 1 tablet by mouth 4 (four) times daily as needed for diarrhea or loose stools (at each meal time). 160 tablet 5  . fish oil-omega-3 fatty acids 1000 MG capsule Take 1 g by mouth every morning.     [name] Leuprolide Acetate, 6 Month, (LUPRON) 45 MG injection Inject into the muscle.    . levothyroxine (SYNTHROID, LEVOTHROID) 50 MCG tablet Take 1 tablet (50 mcg total) by mouth every morning. 90 tablet 3  . metoprolol tartrate (LOPRESSOR) 25 MG tablet Take 0.5 tablets (12.5 mg total) by mouth 2 (two) times daily. 90 tablet 3  . vitamin C (ASCORBIC ACID) 500 MG tablet Take 500 mg by mouth every morning.     [name] amoxicillin (AMOXIL) 500 MG capsule TAKE 4 CAPSULES BY MOUTH 1 HOUR prior TO dental appointment.  1  . meclizine (ANTIVERT) 25 MG tablet Take 1 tablet (25 mg total) by mouth 3 (three) times daily as needed. (Patient not taking: Reported on 01/18/2018) 20 tablet  0  . nitroGLYCERIN (NITROSTAT) 0.4 MG SL tablet Place 1 tablet (0.4 mg total) under the tongue every 5 (five) minutes as needed for chest pain. (Patient not taking: Reported on 01/18/2018) 25 tablet 3   No current facility-administered medications for this encounter.     Physical Findings:  height is 5\' 9"  (1.753 m) and weight is 169 lb (76.7 kg). His oral temperature is 98.5 F (36.9 C). His blood pressure is 129/73 and his pulse is 77. His respiration is 20 and oxygen saturation is 100%.   /10 In general this is a well appearing Caucasian male in no acute distress.  He's alert and oriented x4 and appropriate throughout the  examination. Cardiopulmonary assessment is negative for acute distress and he exhibits normal effort.   Lab Findings: Lab Results  Component Value Date   WBC 3.4 (L) 12/22/2017   HGB 9.2 (L) 12/22/2017   HCT 29.8 (L) 12/22/2017   MCV 88.2 12/22/2017   PLT 121 (L) 12/22/2017     Radiographic Findings: No results found.  Impression/Plan: 1. 84 y.o. gentleman with progressive locally advanced prostate cancer. He appears to have recovered well from the effects of his recent radiotherapy.  We discussed that while we are happy to continue to participate in his care if clinically indicated, at this point, we will plan to see him back on an as-needed basis.  He will continue in routine follow up with Dr. Alen Blew in medical oncology and Dr. Burnell Blanks in urology for ongoing PSA determinations and disease monitoring. He has an appointment scheduled with Dr. Alen Blew on March 23, 2018.  His last Lupron injection for ADT was given in October 2019 with Dr. Ivory Broad and the plan at this time is to remain on this treatment indefinitely.  He understands what to expect with regards to PSA monitoring going forward. We will look forward to following his response to treatment via correspondence with Medical Oncology and Urology, and would be happy to continue to participate in his care if clinically indicated. I talked to the patient about what to expect in the future, including his risk for erectile dysfunction and rectal bleeding. I encouraged him to call or return to the office if he has any questions regarding his previous radiation or possible radiation side effects. He was comfortable with this plan and will follow up as needed.    Nicholos Johns, PA-C

## 2018-01-30 ENCOUNTER — Encounter: Payer: Self-pay | Admitting: Family Medicine

## 2018-01-30 ENCOUNTER — Ambulatory Visit (INDEPENDENT_AMBULATORY_CARE_PROVIDER_SITE_OTHER): Payer: Medicare HMO | Admitting: Family Medicine

## 2018-01-30 VITALS — BP 130/58 | HR 55 | Temp 97.9°F | Ht 68.5 in | Wt 167.8 lb

## 2018-01-30 DIAGNOSIS — K219 Gastro-esophageal reflux disease without esophagitis: Secondary | ICD-10-CM | POA: Diagnosis not present

## 2018-01-30 DIAGNOSIS — R339 Retention of urine, unspecified: Secondary | ICD-10-CM | POA: Diagnosis not present

## 2018-01-30 DIAGNOSIS — I480 Paroxysmal atrial fibrillation: Secondary | ICD-10-CM | POA: Diagnosis not present

## 2018-01-30 DIAGNOSIS — E782 Mixed hyperlipidemia: Secondary | ICD-10-CM

## 2018-01-30 DIAGNOSIS — I5032 Chronic diastolic (congestive) heart failure: Secondary | ICD-10-CM

## 2018-01-30 DIAGNOSIS — E039 Hypothyroidism, unspecified: Secondary | ICD-10-CM

## 2018-01-30 DIAGNOSIS — I10 Essential (primary) hypertension: Secondary | ICD-10-CM | POA: Diagnosis not present

## 2018-01-30 DIAGNOSIS — C61 Malignant neoplasm of prostate: Secondary | ICD-10-CM

## 2018-01-30 LAB — HEPATIC FUNCTION PANEL
ALK PHOS: 68 U/L (ref 39–117)
ALT: 11 U/L (ref 0–53)
AST: 16 U/L (ref 0–37)
Albumin: 3.4 g/dL — ABNORMAL LOW (ref 3.5–5.2)
BILIRUBIN DIRECT: 0.1 mg/dL (ref 0.0–0.3)
BILIRUBIN TOTAL: 0.5 mg/dL (ref 0.2–1.2)
Total Protein: 6.4 g/dL (ref 6.0–8.3)

## 2018-01-30 LAB — CBC WITH DIFFERENTIAL/PLATELET
BASOS ABS: 0 10*3/uL (ref 0.0–0.1)
Basophils Relative: 0.6 % (ref 0.0–3.0)
EOS ABS: 0.1 10*3/uL (ref 0.0–0.7)
Eosinophils Relative: 2.4 % (ref 0.0–5.0)
HCT: 31.5 % — ABNORMAL LOW (ref 39.0–52.0)
Hemoglobin: 10.2 g/dL — ABNORMAL LOW (ref 13.0–17.0)
LYMPHS ABS: 0.9 10*3/uL (ref 0.7–4.0)
Lymphocytes Relative: 19.4 % (ref 12.0–46.0)
MCHC: 32.5 g/dL (ref 30.0–36.0)
MCV: 82.4 fl (ref 78.0–100.0)
Monocytes Absolute: 0.4 10*3/uL (ref 0.1–1.0)
Monocytes Relative: 8.3 % (ref 3.0–12.0)
NEUTROS PCT: 69.3 % (ref 43.0–77.0)
Neutro Abs: 3.4 10*3/uL (ref 1.4–7.7)
Platelets: 200 10*3/uL (ref 150.0–400.0)
RBC: 3.82 Mil/uL — AB (ref 4.22–5.81)
RDW: 16.8 % — ABNORMAL HIGH (ref 11.5–15.5)
WBC: 4.9 10*3/uL (ref 4.0–10.5)

## 2018-01-30 LAB — LIPID PANEL
CHOL/HDL RATIO: 3
Cholesterol: 127 mg/dL (ref 0–200)
HDL: 46.7 mg/dL (ref >39.00)
LDL CALC: 62 mg/dL (ref 0–99)
NONHDL: 80.79
Triglycerides: 93 mg/dL (ref 0.0–149.0)
VLDL: 18.6 mg/dL (ref 0.0–40.0)

## 2018-01-30 MED ORDER — ATORVASTATIN CALCIUM 10 MG PO TABS: ORAL_TABLET | ORAL | 3 refills | Status: DC

## 2018-01-30 MED ORDER — LEVOTHYROXINE SODIUM 50 MCG PO TABS: 50.0000 ug | ORAL_TABLET | Freq: Every morning | ORAL | 3 refills | Status: DC

## 2018-01-30 NOTE — Progress Notes (Signed)
   Subjective:    Patient ID: Luis Buba., male    DOB: 14-May-1927, 84 y.o.   MRN: [id number]  HPI Here to follow up on issues. He is doing fairly well. He has chronic diarrhea after having radiation therapy for prostate cancer, but this is controlled with Lomotil. He has some urinary retention, and he is followed closely by Dr. Harlow Asa. He sees Dr. Alen Blew frequently as well. His BP is stable. His CHF and paroxysmal atrial fibrillation are stable.    Review of Systems  Constitutional: Negative.   HENT: Negative.   Eyes: Negative.   Respiratory: Negative.   Cardiovascular: Negative.   Gastrointestinal: Positive for diarrhea.  Genitourinary: Positive for difficulty urinating.  Musculoskeletal: Negative.   Skin: Negative.   Neurological: Negative.   Psychiatric/Behavioral: Negative.        Objective:   Physical Exam  Constitutional: He is oriented to person, place, and time. He appears well-developed and well-nourished. No distress.  HENT:  Head: Normocephalic and atraumatic.  Right Ear: External ear normal.  Left Ear: External ear normal.  Nose: Nose normal.  Mouth/Throat: Oropharynx is clear and moist. No oropharyngeal exudate.  Eyes: Pupils are equal, round, and reactive to light. Conjunctivae and EOM are normal. Right eye exhibits no discharge. Left eye exhibits no discharge. No scleral icterus.  Neck: Neck supple. No JVD present. No tracheal deviation present. No thyromegaly present.  Cardiovascular: Normal rate, regular rhythm, normal heart sounds and intact distal pulses. Exam reveals no gallop and no friction rub.  No murmur heard. Pulmonary/Chest: Effort normal and breath sounds normal. No respiratory distress. He has no wheezes. He has no rales. He exhibits no tenderness.  Abdominal: Soft. Bowel sounds are normal. He exhibits no distension and no mass. There is no tenderness. There is no rebound and no guarding.  Musculoskeletal: Normal range of motion. He  exhibits no edema or tenderness.  Lymphadenopathy:    He has no cervical adenopathy.  Neurological: He is alert and oriented to person, place, and time. He has normal reflexes. He displays normal reflexes. No cranial nerve deficit. He exhibits normal muscle tone. Coordination normal.  Skin: Skin is warm and dry. No rash noted. He is not diaphoretic. No erythema. No pallor.  Psychiatric: He has a normal mood and affect. His behavior is normal. Judgment and thought content normal.          Assessment & Plan:  His atrial fibrillation and CHF and HTN are stable. He is followed closely by Dr. Harlow Asa and Dr. Alen Blew for prostate cancer. He has chronic but stable anemia. His hypothyroidism is well controlled. We will check fasting labs today for the lipids, etc.  Alysia Penna, MD

## 2018-02-06 ENCOUNTER — Encounter: Payer: Self-pay | Admitting: *Deleted

## 2018-02-28 DIAGNOSIS — R339 Retention of urine, unspecified: Secondary | ICD-10-CM | POA: Diagnosis not present

## 2018-02-28 DIAGNOSIS — C61 Malignant neoplasm of prostate: Secondary | ICD-10-CM | POA: Diagnosis not present

## 2018-03-23 ENCOUNTER — Telehealth: Payer: Self-pay | Admitting: Oncology

## 2018-03-23 ENCOUNTER — Inpatient Hospital Stay: Payer: Medicare HMO | Attending: Oncology

## 2018-03-23 ENCOUNTER — Inpatient Hospital Stay (HOSPITAL_BASED_OUTPATIENT_CLINIC_OR_DEPARTMENT_OTHER): Payer: Medicare HMO | Admitting: Oncology

## 2018-03-23 VITALS — BP 150/72 | HR 70 | Temp 98.2°F | Resp 17 | Ht 68.5 in | Wt 176.5 lb

## 2018-03-23 DIAGNOSIS — Z923 Personal history of irradiation: Secondary | ICD-10-CM

## 2018-03-23 DIAGNOSIS — C61 Malignant neoplasm of prostate: Secondary | ICD-10-CM

## 2018-03-23 DIAGNOSIS — R197 Diarrhea, unspecified: Secondary | ICD-10-CM

## 2018-03-23 DIAGNOSIS — Z7982 Long term (current) use of aspirin: Secondary | ICD-10-CM | POA: Insufficient documentation

## 2018-03-23 DIAGNOSIS — Z79899 Other long term (current) drug therapy: Secondary | ICD-10-CM

## 2018-03-23 LAB — CBC WITH DIFFERENTIAL (CANCER CENTER ONLY)
Abs Immature Granulocytes: 0.01 10*3/uL (ref 0.00–0.07)
Basophils Absolute: 0 10*3/uL (ref 0.0–0.1)
Basophils Relative: 1 %
Eosinophils Absolute: 0.3 10*3/uL (ref 0.0–0.5)
Eosinophils Relative: 7 %
HCT: 29.6 % — ABNORMAL LOW (ref 39.0–52.0)
Hemoglobin: 8.8 g/dL — ABNORMAL LOW (ref 13.0–17.0)
Immature Granulocytes: 0 %
LYMPHS ABS: 0.8 10*3/uL (ref 0.7–4.0)
LYMPHS PCT: 16 %
MCH: 25.7 pg — ABNORMAL LOW (ref 26.0–34.0)
MCHC: 29.7 g/dL — ABNORMAL LOW (ref 30.0–36.0)
MCV: 86.5 fL (ref 80.0–100.0)
Monocytes Absolute: 0.6 10*3/uL (ref 0.1–1.0)
Monocytes Relative: 12 %
Neutro Abs: 3.2 10*3/uL (ref 1.7–7.7)
Neutrophils Relative %: 64 %
Platelet Count: 154 10*3/uL (ref 150–400)
RBC: 3.42 MIL/uL — ABNORMAL LOW (ref 4.22–5.81)
RDW: 14.7 % (ref 11.5–15.5)
WBC Count: 5 10*3/uL (ref 4.0–10.5)
nRBC: 0 % (ref 0.0–0.2)

## 2018-03-23 LAB — CMP (CANCER CENTER ONLY)
ALT: 18 U/L (ref 0–44)
AST: 22 U/L (ref 15–41)
Albumin: 3.3 g/dL — ABNORMAL LOW (ref 3.5–5.0)
Alkaline Phosphatase: 84 U/L (ref 38–126)
Anion gap: 7 (ref 5–15)
BUN: 29 mg/dL — ABNORMAL HIGH (ref 8–23)
CO2: 25 mmol/L (ref 22–32)
Calcium: 8.8 mg/dL — ABNORMAL LOW (ref 8.9–10.3)
Chloride: 110 mmol/L (ref 98–111)
Creatinine: 1.33 mg/dL — ABNORMAL HIGH (ref 0.61–1.24)
GFR, Est AFR Am: 55 mL/min — ABNORMAL LOW (ref >60)
GFR, Estimated: 47 mL/min — ABNORMAL LOW (ref >60)
Glucose, Bld: 113 mg/dL — ABNORMAL HIGH (ref 70–99)
Potassium: 4.9 mmol/L (ref 3.5–5.1)
SODIUM: 142 mmol/L (ref 135–145)
Total Bilirubin: 0.3 mg/dL (ref 0.3–1.2)
Total Protein: 6.7 g/dL (ref 6.5–8.1)

## 2018-03-23 NOTE — Progress Notes (Signed)
Hematology and Oncology Follow Up Visit  [name] [id number] 12/29/1927 85 y.o. 03/23/2018 1:21 PM [name], MDFry, Ishmael Holter, MD   Principle Diagnosis: 85 year old man with locally advanced prostate cancer documented in December 2018.  He initially diagnosed in 2000 with a Gleason score 3+4 = 7.    Prior Therapy:  He is status post radiation therapy total of 44 Gy in 20 fractions to the pelvic nodes as well as boost to the prostate of 60 Gy 20 fractions.  I was completed in October 2019.  Current therapy:  Androgen deprivation continuously under the care of his urologist.  Interim History: Mr. [name] is here for a repeat evaluation.  Since the last visit, he reports no major changes in his health.  He continues to improve slowly after completing radiation therapy.  His diarrhea has improved and his appetite is recovering.  He has gained more weight since the last visit.  His pelvic discomfort has improved as well.  He ambulates without any major difficulties although he does report unsteadiness and occasional cramps in his muscles.  His quality of life remains unchanged.   Patient denied any alteration mental status, neuropathy, confusion or dizziness.  Denies any headaches or lethargy.  Denies any night sweats, weight loss or changes in appetite.  Denied orthopnea, dyspnea on exertion or chest discomfort.  Denies shortness of breath, difficulty breathing hemoptysis or cough.  Denies any abdominal distention, nausea, early satiety or dyspepsia.  Denies any hematuria, frequency, dysuria or nocturia.  Denies any skin irritation, dryness or rash.  Denies any ecchymosis or petechiae.  Denies any lymphadenopathy or clotting.  Denies any heat or cold intolerance.  Denies any anxiety or depression.  Remaining review of system is negative.      Medications: I have reviewed the patient's current medications.  Current Outpatient Medications  Medication Sig Dispense Refill  .  amoxicillin (AMOXIL) 500 MG capsule TAKE 4 CAPSULES BY MOUTH 1 HOUR prior TO dental appointment.  1  . aspirin EC 81 MG tablet Take 1 tablet (81 mg total) by mouth daily. 90 tablet 3  . atorvastatin (LIPITOR) 10 MG tablet TAKE 1 TABLET BY MOUTH EVERY DAY AT 6:00 PM 90 tablet 3  . Cholecalciferol (VITAMIN D3 PO) Take 2,000 Units by mouth daily.    . diphenoxylate-atropine (LOMOTIL) 2.5-0.025 MG tablet Take 1 tablet by mouth 4 (four) times daily as needed for diarrhea or loose stools (at each meal time). 160 tablet 5  . fish oil-omega-3 fatty acids 1000 MG capsule Take 1 g by mouth every morning.     [name] Leuprolide Acetate, 6 Month, (LUPRON) 45 MG injection Inject into the muscle.    . levothyroxine (SYNTHROID, LEVOTHROID) 50 MCG tablet Take 1 tablet (50 mcg total) by mouth every morning. 90 tablet 3  . meclizine (ANTIVERT) 25 MG tablet Take 1 tablet (25 mg total) by mouth 3 (three) times daily as needed. 20 tablet 0  . metoprolol tartrate (LOPRESSOR) 25 MG tablet Take 0.5 tablets (12.5 mg total) by mouth 2 (two) times daily. 90 tablet 3  . nitroGLYCERIN (NITROSTAT) 0.4 MG SL tablet Place 1 tablet (0.4 mg total) under the tongue every 5 (five) minutes as needed for chest pain. 25 tablet 3  . vitamin C (ASCORBIC ACID) 500 MG tablet Take 500 mg by mouth every morning.      No current facility-administered medications for this visit.      Allergies:  Allergies  Allergen Reactions  .  Other Other (See Comments)    ANESTHESIA.... 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 ANESTHESIA.... 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 Occurred post op CABG 2015, required readmission and rehab    Past Medical History, Surgical history, Social history, and Family History were reviewed and updated.   Physical Exam:  Blood pressure (!) 150/72, pulse 70, temperature 98.2 F (36.8 C), temperature source Oral, resp. rate 17, height 5' 8.5" (1.74 m), weight 176 lb 8 oz (80.1 kg),  SpO2 100 %.    ECOG:  1   General appearance: Alert, awake without any distress. Head: Atraumatic without abnormalities Oropharynx: Without any thrush or ulcers. Eyes: No scleral icterus. Lymph nodes: No lymphadenopathy noted in the cervical, supraclavicular, or axillary nodes Heart:regular rate and rhythm, without any murmurs or gallops.   Lung: Clear to auscultation without any rhonchi, wheezes or dullness to percussion. Abdomin: Soft, nontender without any shifting dullness or ascites. Musculoskeletal: No clubbing or cyanosis. Neurological: No motor or sensory deficits. Skin: No rashes or lesions.      Lab Results: Lab Results  Component Value Date   WBC 4.9 01/30/2018   HGB 10.2 (L) 01/30/2018   HCT 31.5 (L) 01/30/2018   MCV 82.4 01/30/2018   PLT 200.0 01/30/2018     Chemistry      Component Value Date/Time   NA 140 12/22/2017 1111   K 4.5 12/22/2017 1111   CL 107 12/22/2017 1111   CO2 26 12/22/2017 1111   BUN 32 (H) 12/22/2017 1111   CREATININE 1.50 (H) 12/22/2017 1111      Component Value Date/Time   CALCIUM 8.7 (L) 12/22/2017 1111   ALKPHOS 68 01/30/2018 1010   AST 16 01/30/2018 1010   AST 19 12/22/2017 1111   ALT 11 01/30/2018 1010   ALT 15 12/22/2017 1111   BILITOT 0.5 01/30/2018 1010   BILITOT 0.4 12/22/2017 1111     Results for ZIV, WELCHEL (MRN [id number]) as of 03/23/2018 12:55  Ref. Range 01/20/2016 15:56 07/01/2016 13:40 01/26/2017 11:03 12/22/2017 11:11  PSA Latest Ref Range: 0.10 - 4.00 ng/mL 10.34 (H) 11.43 (H) 10.84 (H)   Prostate Specific Ag, Serum Latest Ref Range: 0.0 - 4.0 ng/mL    0.3     Impression and Plan:     85 year old man with:  1.    Locally advanced prostate cancer diagnosed in 2018 after presenting with localized disease in 2000 with Gleason score is 3+4 = 7.    He is status post definitive therapy with radiation completed in October 2019 with excellent response in his PSA currently at 0.3.  The natural  course of this disease and additional treatment for his prostate cancer were reviewed today.  These would be in the form of oral targeted therapy and systemic chemotherapy.  At this time, I have recommended continuing androgen deprivation alone and using additional therapy if needed if he developed castration-resistant disease.  We will repeat staging CT scan before the next visit.  2.  Androgen deprivation: He continues to be on androgen deprivation under the care of his urologist.  I recommended continuing this indefinitely.  3.  Pelvic discomfort: Improved after radiation therapy.  4.  Follow-up: We will be in 3 months.  15  minutes was spent with the patient face-to-face today.  More than 50% of time was dedicated to discussing his disease status, treatment options and long-term complication associated with therapy.    [name], MD 1/30/20201:21 PM

## 2018-03-23 NOTE — Telephone Encounter (Signed)
Scheduled appt per 01/30 los. ° °Printed calendar and avs. °

## 2018-03-24 ENCOUNTER — Telehealth: Payer: Self-pay | Admitting: *Deleted

## 2018-03-24 LAB — PROSTATE-SPECIFIC AG, SERUM (LABCORP)

## 2018-03-24 LAB — TESTOSTERONE: Testosterone: <3 ng/dL — ABNORMAL LOW (ref 264–916)

## 2018-03-24 NOTE — Telephone Encounter (Signed)
-----   Message from Wyatt Portela, MD sent at 03/24/2018  8:52 AM EST ----- Please let him know his PSA is down.

## 2018-03-24 NOTE — Telephone Encounter (Signed)
Spoke with patient, gave results of PSA and HGB

## 2018-04-11 DIAGNOSIS — C61 Malignant neoplasm of prostate: Secondary | ICD-10-CM | POA: Diagnosis not present

## 2018-04-17 ENCOUNTER — Other Ambulatory Visit: Payer: Self-pay | Admitting: Family Medicine

## 2018-04-19 DIAGNOSIS — N35919 Unspecified urethral stricture, male, unspecified site: Secondary | ICD-10-CM | POA: Diagnosis not present

## 2018-04-19 DIAGNOSIS — C61 Malignant neoplasm of prostate: Secondary | ICD-10-CM | POA: Diagnosis not present

## 2018-04-19 DIAGNOSIS — N135 Crossing vessel and stricture of ureter without hydronephrosis: Secondary | ICD-10-CM | POA: Insufficient documentation

## 2018-04-19 DIAGNOSIS — N35819 Other urethral stricture, male, unspecified site: Secondary | ICD-10-CM | POA: Diagnosis not present

## 2018-04-19 DIAGNOSIS — K8020 Calculus of gallbladder without cholecystitis without obstruction: Secondary | ICD-10-CM | POA: Diagnosis not present

## 2018-04-23 ENCOUNTER — Emergency Department (HOSPITAL_COMMUNITY)
Admission: EM | Admit: 2018-04-23 | Discharge: 2018-04-23 | Disposition: A | Discharge status: Home / Self Care | Payer: Medicare HMO | Attending: Emergency Medicine | Admitting: Emergency Medicine

## 2018-04-23 ENCOUNTER — Emergency Department (HOSPITAL_COMMUNITY): Payer: Medicare HMO

## 2018-04-23 ENCOUNTER — Encounter (HOSPITAL_COMMUNITY): Payer: Self-pay | Admitting: Emergency Medicine

## 2018-04-23 ENCOUNTER — Other Ambulatory Visit: Payer: Self-pay

## 2018-04-23 DIAGNOSIS — Z7982 Long term (current) use of aspirin: Secondary | ICD-10-CM | POA: Diagnosis not present

## 2018-04-23 DIAGNOSIS — M25552 Pain in left hip: Secondary | ICD-10-CM | POA: Insufficient documentation

## 2018-04-23 DIAGNOSIS — Z951 Presence of aortocoronary bypass graft: Secondary | ICD-10-CM | POA: Diagnosis not present

## 2018-04-23 DIAGNOSIS — E039 Hypothyroidism, unspecified: Secondary | ICD-10-CM | POA: Diagnosis not present

## 2018-04-23 DIAGNOSIS — M25551 Pain in right hip: Secondary | ICD-10-CM | POA: Diagnosis not present

## 2018-04-23 DIAGNOSIS — Y9389 Activity, other specified: Secondary | ICD-10-CM | POA: Diagnosis not present

## 2018-04-23 DIAGNOSIS — Y998 Other external cause status: Secondary | ICD-10-CM | POA: Insufficient documentation

## 2018-04-23 DIAGNOSIS — W01198A Fall on same level from slipping, tripping and stumbling with subsequent striking against other object, initial encounter: Secondary | ICD-10-CM | POA: Diagnosis not present

## 2018-04-23 DIAGNOSIS — Z79899 Other long term (current) drug therapy: Secondary | ICD-10-CM | POA: Diagnosis not present

## 2018-04-23 DIAGNOSIS — Y92512 Supermarket, store or market as the place of occurrence of the external cause: Secondary | ICD-10-CM | POA: Insufficient documentation

## 2018-04-23 DIAGNOSIS — R0781 Pleurodynia: Secondary | ICD-10-CM | POA: Diagnosis not present

## 2018-04-23 DIAGNOSIS — S161XXA Strain of muscle, fascia and tendon at neck level, initial encounter: Secondary | ICD-10-CM | POA: Diagnosis not present

## 2018-04-23 DIAGNOSIS — Z8546 Personal history of malignant neoplasm of prostate: Secondary | ICD-10-CM | POA: Diagnosis not present

## 2018-04-23 DIAGNOSIS — I2510 Atherosclerotic heart disease of native coronary artery without angina pectoris: Secondary | ICD-10-CM | POA: Diagnosis not present

## 2018-04-23 DIAGNOSIS — I5032 Chronic diastolic (congestive) heart failure: Secondary | ICD-10-CM | POA: Diagnosis not present

## 2018-04-23 DIAGNOSIS — S79911A Unspecified injury of right hip, initial encounter: Secondary | ICD-10-CM | POA: Diagnosis not present

## 2018-04-23 DIAGNOSIS — S40012A Contusion of left shoulder, initial encounter: Secondary | ICD-10-CM

## 2018-04-23 DIAGNOSIS — I110 Hypertensive heart disease with heart failure: Secondary | ICD-10-CM | POA: Insufficient documentation

## 2018-04-23 DIAGNOSIS — S79912A Unspecified injury of left hip, initial encounter: Secondary | ICD-10-CM | POA: Diagnosis not present

## 2018-04-23 DIAGNOSIS — W19XXXA Unspecified fall, initial encounter: Secondary | ICD-10-CM

## 2018-04-23 DIAGNOSIS — S4992XA Unspecified injury of left shoulder and upper arm, initial encounter: Secondary | ICD-10-CM | POA: Diagnosis not present

## 2018-04-23 DIAGNOSIS — S199XXA Unspecified injury of neck, initial encounter: Secondary | ICD-10-CM | POA: Diagnosis not present

## 2018-04-23 DIAGNOSIS — M25512 Pain in left shoulder: Secondary | ICD-10-CM | POA: Diagnosis not present

## 2018-04-23 DIAGNOSIS — S299XXA Unspecified injury of thorax, initial encounter: Secondary | ICD-10-CM | POA: Diagnosis not present

## 2018-04-23 MED ORDER — DICLOFENAC SODIUM 1 % TD GEL: 2.0000 g | Freq: Four times a day (QID) | TRANSDERMAL | 0 refills | Status: DC

## 2018-04-23 NOTE — ED Notes (Signed)
ED Provider at bedside. 

## 2018-04-23 NOTE — ED Triage Notes (Signed)
Pt was at grocery store and fell on mat at ice machine. Pt hit left shoulder on handle of ice machine and then landed on the ground. Pt c/o bilat hip pain. Denies LOC. Reports takes baby ASA daily.

## 2018-04-23 NOTE — ED Provider Notes (Signed)
[facility] DEPT Provider Note   CSN: [id number] Arrival date & time: 04/23/18  1400    History   Chief Complaint Chief Complaint  Patient presents with  . Fall  . Shoulder Pain    left  . Hip Pain    bilat    HPI [name]. is a 85 y.o. male with a past medical history of A. fib, currently under treatment for prostate cancer who presents to ED for chief complaint of pain after mechanical fall. He was at the grocery store with his daughter who is at bedside.  States that he was walking when he slipped on a mat on the floor causing him to stumble, hit his left shoulder on the handle of an ice machine.  He then fell onto his left hip and then rolled onto his right hip.  He was able to be helped up onto a chair with staff and daughter.  He denies any head injury or loss of consciousness.  He was able to ambulate to his car, back to apartment and then arrived in the ED.  He states that he is also now having a "twinge" in his neck as well as pain in his right lower ribs.  He denies any headache, vision changes, changes to memory, anticoagulant use, vomiting, abdominal pain, numbness in arms or legs or back pain.     HPI  Past Medical History:  Diagnosis Date  . Complication of anesthesia    "serious cognisance problems since my OR in January" (03/13/2013  . Coronary artery disease   . Hx of echocardiogram    Echo 5/16:  Mild focal basal septal hypertrophy, EF 55%, mild AI, MV repair ok with trivial MR (mean 3 mmHg), mild LAE, mild RAE, PASP 35 mmHg  . Hypertension   . Irritable bowel syndrome   . PAF (paroxysmal atrial fibrillation) ([place]) 05/03/2014  . Personal history of colonic polyps    tubular adenoma  . Pleural effusion, bilateral 03/12/2013   Small to moderate, L>R  . Prostate cancer [facility]) 1991   sees Dr. Carlota Raspberry at [facility], observation only   . S/P CABG x 2 02/28/2013   LIMA to LAD, SVG to OM1, EVH via right thigh  . S/P Maze operation  for atrial fibrillation 02/28/2013   Complete bilateral atrial lesion set using bipolar radiofrequency and cryothermy ablation with clipping of LA appendage  . S/P mitral valve repair, maze procedure, and CABG x2 02/28/2013   Complex valvuloplasty including triangular resection of posterior leaflet, 30 mm Sorin Memo 3D ring annuloplasty    Patient Active Problem List   Diagnosis Date Noted  . PVC's (premature ventricular contractions) 11/07/2017  . Urinary retention 02/04/2017  . PAF (paroxysmal atrial fibrillation), CHA2DS2VASc score 4 05/03/2014  . Dizziness 05/03/2014  . Encounter for therapeutic drug monitoring 05/07/2013  . S/P CABG x 2 03/26/2013  . S/P MVR (mitral valve repair) 03/26/2013  . S/P Maze operation for atrial fibrillation 03/26/2013  . Current use of long term anticoagulation 03/16/2013  . Chronic diastolic CHF (congestive heart failure) ([place]) 03/12/2013  . Anemia- post op 03/12/2013  . Pre-syncope 03/12/2013  . Pleural effusion, bilateral 03/12/2013  . Delirium 03/12/2013  . CAD (coronary artery disease) 03/06/2013  . S/P mitral valve repair, maze procedure, and CABG x2 02/29/12 02/28/2013  . Unstable angina ([place]) 02/26/2013  . Special screening for malignant neoplasms, colon 10/05/2010  . Benign neoplasm of colon 10/05/2010  . Diverticulosis of colon (without mention  of hemorrhage) 10/05/2010  . ADENOCARCINOMA, PROSTATE, GLEASON GRADE 3 08/13/2009  . SEBACEOUS CYST 08/13/2009  . Hypothyroidism 08/02/2007  . GERD 08/02/2007  . OSTEOPENIA 08/02/2007  . UNS ADVRS EFF OTH RX MEDICINAL&BIOLOGICAL SBSTNC 08/02/2007  . HLD (hyperlipidemia) 12/15/2006  . PROSTATE SPECIFIC ANTIGEN, ELEVATED 12/15/2006  . Essential hypertension 09/05/2006  . ARTHRITIS 09/05/2006  . COLONIC POLYPS, HX OF 09/05/2006    Past Surgical History:  Procedure Laterality Date  . CARDIAC CATHETERIZATION  2015  . CATARACT EXTRACTION, BILATERAL Bilateral 2014   per Dr. Gershon Crane   . COLONOSCOPY   2011   per Dr. Sharlett Iles, benign polyps, no repeats needed   . CORONARY ARTERY BYPASS GRAFT N/A 02/28/2013   Procedure: CORONARY ARTERY BYPASS GRAFTING (CABG);  Surgeon: Rexene Alberts, MD;  Location: [facility];  Service: Open Heart Surgery;  Laterality: N/A;  CABG x 2,  using left internal mammary artery and right leg saphenous vein harvested endoscopically  . HEMORRHOID SURGERY    . INTRAOPERATIVE TRANSESOPHAGEAL ECHOCARDIOGRAM N/A 02/28/2013   Procedure: INTRAOPERATIVE TRANSESOPHAGEAL ECHOCARDIOGRAM;  Surgeon: Rexene Alberts, MD;  Location: [facility];  Service: Open Heart Surgery;  Laterality: N/A;  . LEFT HEART CATHETERIZATION WITH CORONARY ANGIOGRAM N/A 02/26/2013   Procedure: LEFT HEART CATHETERIZATION WITH CORONARY ANGIOGRAM;  Surgeon: Leonie Man, MD;  Location: [facility] CATH LAB;  Service: Cardiovascular;  Laterality: N/A;  . MAZE N/A 02/28/2013   Procedure: MAZE;  Surgeon: Rexene Alberts, MD;  Location: [facility];  Service: Open Heart Surgery;  Laterality: N/A;  . MITRAL VALVE REPAIR N/A 02/28/2013   Procedure: MITRAL VALVE REPAIR (MVR);  Surgeon: Rexene Alberts, MD;  Location: [facility];  Service: Open Heart Surgery;  Laterality: N/A;  . PROSTATE BIOPSY    . TRANSURETHRAL RESECTION OF PROSTATE N/A 02/04/2017   Procedure: TRANSURETHRAL RESECTION OF THE PROSTATE (TURP);  Surgeon: Alexis Frock, MD;  Location: [facility];  Service: Urology;  Laterality: N/A;        Home Medications    Prior to Admission medications   Medication Sig Start Date End Date Taking? Authorizing Provider  aspirin EC 81 MG tablet Take 1 tablet (81 mg total) by mouth daily. 11/07/17  Yes Fay Records, MD  atorvastatin (LIPITOR) 10 MG tablet TAKE 1 TABLET BY MOUTH EVERY DAY AT 6:00 PM 01/30/18  Yes [name], MD  Cholecalciferol (VITAMIN D3 PO) Take 2,000 Units by mouth daily.   Yes [provider]  ciprofloxacin (CIPRO) 500 MG tablet Take 500 mg by mouth 2 (two) times daily.  04/19/18  Yes [provider]  fish  oil-omega-3 fatty acids 1000 MG capsule Take 1 g by mouth every morning.    Yes [provider]  levothyroxine (SYNTHROID, LEVOTHROID) 50 MCG tablet Take 1 tablet (50 mcg total) by mouth every morning. 01/30/18  Yes [name], MD  metoprolol tartrate (LOPRESSOR) 25 MG tablet TAKE ONE TABLET BY MOUTH TWICE A DAY 04/18/18  Yes [name], MD  vitamin C (ASCORBIC ACID) 500 MG tablet Take 500 mg by mouth every morning.    Yes [provider]  amoxicillin (AMOXIL) 500 MG capsule Take 2,000 mg by mouth as directed. Prior to dental appointment 07/30/14   [provider]  diclofenac sodium (VOLTAREN) 1 % GEL Apply 2 g topically 4 (four) times daily. 04/23/18   Naquita Nappier, PA-C  diphenoxylate-atropine (LOMOTIL) 2.5-0.025 MG tablet Take 1 tablet by mouth 4 (four) times daily as needed for diarrhea or loose  stools (at each meal time). 12/09/17   Tyler Pita, MD  Leuprolide Acetate, 6 Month, (LUPRON) 45 MG injection Inject 45 mg into the muscle every 6 (six) months.     [provider]  meclizine (ANTIVERT) 25 MG tablet Take 1 tablet (25 mg total) by mouth 3 (three) times daily as needed. Patient taking differently: Take 25 mg by mouth 3 (three) times daily as needed for dizziness.  05/03/14   Glendell Docker, NP  nitroGLYCERIN (NITROSTAT) 0.4 MG SL tablet Place 1 tablet (0.4 mg total) under the tongue every 5 (five) minutes as needed for chest pain. 03/17/15   Fay Records, MD    Family History Family History  Problem Relation Age of Onset  . Hypertension Mother   . Sudden death Mother   . Hypertension Father   . Sudden death Father   . Heart disease Son   . Diabetes Other   . Hypertension Other   . Sudden death Other   . Heart disease Other   . Stroke Other   . Lung cancer Brother   . Stomach cancer Brother   . Cancer Daughter   . Cancer Brother   . Diabetes Sister     Social History Social History   Tobacco Use  . Smoking status: Never Smoker   . Smokeless tobacco: Never Used  Substance Use Topics  . Alcohol use: No    Alcohol/week: 0.0 standard drinks  . Drug use: No     Allergies   Other   Review of Systems Review of Systems  Constitutional: Negative for appetite change, chills and fever.  HENT: Negative for ear pain, rhinorrhea, sneezing and sore throat.   Eyes: Negative for photophobia and visual disturbance.  Respiratory: Negative for cough, chest tightness, shortness of breath and wheezing.   Cardiovascular: Negative for chest pain and palpitations.  Gastrointestinal: Negative for abdominal pain, blood in stool, constipation, diarrhea, nausea and vomiting.  Genitourinary: Negative for dysuria, hematuria and urgency.  Musculoskeletal: Positive for arthralgias, myalgias and neck pain.  Skin: Negative for rash.  Neurological: Negative for dizziness, weakness and light-headedness.     Physical Exam Updated Vital Signs BP (!) 160/88   Pulse 73   Temp 98.8 F (37.1 C)   Resp 19   SpO2 100%   Physical Exam Vitals signs and nursing note reviewed.  Constitutional:      General: He is not in acute distress.    Appearance: He is well-developed.  HENT:     Head: Normocephalic and atraumatic.     Nose: Nose normal.  Eyes:     General: No scleral icterus.       Right eye: No discharge.        Left eye: No discharge.     Conjunctiva/sclera: Conjunctivae normal.     Pupils: Pupils are equal, round, and reactive to light.  Neck:     Musculoskeletal: Normal range of motion and neck supple. Spinous process tenderness and muscular tenderness present.   Cardiovascular:     Rate and Rhythm: Normal rate and regular rhythm.     Heart sounds: Normal heart sounds. No murmur. No friction rub. No gallop.   Pulmonary:     Effort: Pulmonary effort is normal. No respiratory distress.     Breath sounds: Normal breath sounds.    Abdominal:     General: Bowel sounds are normal. There is no distension.     Palpations:  Abdomen is soft.     Tenderness: There  is no abdominal tenderness. There is no guarding.     Comments: No bruising or ecchymosis noted.  Musculoskeletal: Normal range of motion.       Back:     Comments: No midline spinal tenderness present in lumbar, thoracic spine. No step-off palpated. No visible bruising, edema or temperature change noted. No objective signs of numbness present. No saddle anesthesia. 2+ DP pulses bilaterally. Sensation intact to light touch. Strength 5/5 in bilateral lower extremities.  Skin:    General: Skin is warm and dry.     Findings: No rash.  Neurological:     Mental Status: He is alert.     Motor: No abnormal muscle tone.     Coordination: Coordination normal.      ED Treatments / Results  Labs (all labs ordered are listed, but only abnormal results are displayed) Labs Reviewed - No data to display  EKG None  Radiology Dg Ribs Unilateral W/chest Right  Result Date: 04/23/2018 CLINICAL DATA:  Fall, lower right rib pain EXAM: RIGHT RIBS AND CHEST - 3+ VIEW COMPARISON:  Chest radiographs dated 05/03/2014 FINDINGS: Lungs are clear.  No pleural effusion or pneumothorax. Mild cardiomegaly.  Thoracic aortic atherosclerosis. No displaced right rib fracture is seen. Median sternotomy. Left ureteral stent, incompletely visualized. IMPRESSION: No displaced right rib fracture is seen. No evidence of acute cardiopulmonary disease. Thoracic aortic atherosclerosis. Left ureteral stent, incompletely visualized. Electronically Signed   By: Julian Hy M.D.   On: 04/23/2018 16:24   Ct Cervical Spine Wo Contrast  Result Date: 04/23/2018 CLINICAL DATA:  Fall EXAM: CT CERVICAL SPINE WITHOUT CONTRAST TECHNIQUE: Multidetector CT imaging of the cervical spine was performed without intravenous contrast. Multiplanar CT image reconstructions were also generated. COMPARISON:  None. FINDINGS: Alignment: Normal cervical lordosis. Skull base and vertebrae: No acute fracture. No  primary bone lesion or focal pathologic process. Soft tissues and spinal canal: No prevertebral fluid or swelling. No visible canal hematoma. Disc levels: Mild degenerative changes of the mid cervical spine. Spinal canal is patent. Upper chest: Visualized lung apices are essentially clear. Other: Visualized thyroid is unremarkable. IMPRESSION: No evidence of traumatic injury to the cervical spine. Electronically Signed   By: Julian Hy M.D.   On: 04/23/2018 16:22   Dg Shoulder Left  Result Date: 04/23/2018 CLINICAL DATA:  Recent slip and fall with left shoulder pain, initial encounter EXAM: LEFT SHOULDER - 2+ VIEW COMPARISON:  05/09/2004, 05/03/2014 FINDINGS: Healed left clavicular fracture is noted. Humeral head is well seated. No acute fracture or dislocation is noted. No soft tissue changes are seen. IMPRESSION: Old left clavicular fracture with healing. No acute abnormality noted. Electronically Signed   By: Inez Catalina M.D.   On: 04/23/2018 14:56   Dg Hip Unilat With Pelvis 2-3 Views Left  Result Date: 04/23/2018 CLINICAL DATA:  Status post fall EXAM: DG HIP (WITH OR WITHOUT PELVIS) 2-3V LEFT DG HIP (WITH OR WITHOUT PELVIS) 2-3V RIGHT COMPARISON:  None. FINDINGS: There is no evidence of hip fracture or dislocation. There is no evidence of arthropathy or other focal bone abnormality. Generalized osteopenia. Partially visualized left nephroureteral stent. Peripheral vascular atherosclerotic disease. IMPRESSION: 1.  No acute osseous injury of the right hip. 2.  No acute osseous injury of the left hip. Electronically Signed   By: Kathreen Devoid   On: 04/23/2018 14:56   Dg Hip Unilat  With Pelvis 2-3 Views Right  Result Date: 04/23/2018 CLINICAL DATA:  Status post fall EXAM: DG HIP (WITH OR  WITHOUT PELVIS) 2-3V LEFT DG HIP (WITH OR WITHOUT PELVIS) 2-3V RIGHT COMPARISON:  None. FINDINGS: There is no evidence of hip fracture or dislocation. There is no evidence of arthropathy or other focal bone  abnormality. Generalized osteopenia. Partially visualized left nephroureteral stent. Peripheral vascular atherosclerotic disease. IMPRESSION: 1.  No acute osseous injury of the right hip. 2.  No acute osseous injury of the left hip. Electronically Signed   By: Kathreen Devoid   On: 04/23/2018 14:56    Procedures Procedures (including critical care time)  Medications Ordered in ED Medications - No data to display   Initial Impression / Assessment and Plan / ED Course  I have reviewed the triage vital signs and the nursing notes.  Pertinent labs & imaging results that were available during my care of the patient were reviewed by me and considered in my medical decision making (see chart for details).        85 year old male presents to ED for mechanical fall that occurred prior to arrival.  He was at the grocery store with his daughter when he slipped on a mat in front of an ice machine.  He hit his left shoulder on the ice machine, rolled onto his left hip and then onto his right hip.  He denies any head injury, loss of consciousness or headache.  He initially complained of left shoulder pain bilateral hip pain.  He is able to ambulate to the car, into his home after the incident.  He states that after being in the x-ray, he has developed several "twinges" including in his neck and right rib area.  Daughter at bedside states that he is at his mental baseline.  He has no deficits on neurological exam.  No bruising, ecchymosis, deformities, change in sensation or weakness noted on my exam.  Tenderness palpation of the left shoulder without changes to range of motion.  Bilateral hip tenderness without deformity or changes to range of motion.  Area is neurovascularly intact.  X-rays of bilateral hips, left shoulder are unremarkable.  Will add on CT of the neck, x-ray of the right ribs.  CT of the neck, x-ray of chest and ribs is unremarkable.  Patient in no acute distress.  He is ambulatory here without  difficulty.  He is requesting discharge home.  We will have him follow-up with PCP, take Tylenol and give Voltaren gel as needed for pain.  Advised to return to ED for any severe worsening symptoms.  Patient is hemodynamically stable, in NAD, and able to ambulate in the ED. Evaluation does not show pathology that would require ongoing emergent intervention or inpatient treatment. I explained the diagnosis to the patient. Pain has been managed and has no complaints prior to discharge. Patient is comfortable with above plan and is stable for discharge at this time. All questions were answered prior to disposition. Strict return precautions for returning to the ED were discussed. Encouraged follow up with PCP.    Portions of this note were generated with [employer]. Dictation errors may occur despite best attempts at proofreading.   Final Clinical Impressions(s) / ED Diagnoses   Final diagnoses:  Fall, initial encounter  Contusion of left shoulder, initial encounter  Strain of neck muscle, initial encounter    ED Discharge Orders         Ordered    diclofenac sodium (VOLTAREN) 1 % GEL  4 times daily     04/23/18 1641  Delia Heady, PA-C 04/23/18 1646    Quintella Reichert, MD 04/24/18 1123

## 2018-04-23 NOTE — ED Notes (Signed)
Bed: WA21 Expected date:  Expected time:  Means of arrival:  Comments: Hold per charge

## 2018-04-23 NOTE — ED Notes (Signed)
Patient transported to CT 

## 2018-04-23 NOTE — Discharge Instructions (Signed)
Follow-up with your primary care provider. Use Voltaren gel as directed.  You can also take Tylenol to help with your pain. Return to the ED for worsening symptoms, chest pain, severe abdominal pain or back pain, inability to walk, headache or blurry vision.

## 2018-04-24 ENCOUNTER — Telehealth: Payer: Self-pay | Admitting: Family Medicine

## 2018-04-24 NOTE — Telephone Encounter (Signed)
Contact home health to assess the patient's needs please

## 2018-04-24 NOTE — Telephone Encounter (Signed)
Copied from [place] [id number]. Topic: Quick Communication - See Telephone Encounter >> Apr 24, 2018  9:07 AM Blase Mess A wrote: CRM for notification. See Telephone encounter for: 04/24/18.  Patient's daughter is calling because parents are needing some home health assistance through [employer]. Was told need a prescription from Dr. Sarajane Jews. Patient fell yesterday.  Living at [facility]. Please advise Viann Shove- [phone]

## 2018-04-24 NOTE — Telephone Encounter (Signed)
Dr. Sarajane Jews please advise on the referral to get some assistance in the home for the pts per their daughter.  Thanks

## 2018-04-27 NOTE — Telephone Encounter (Signed)
I have called and lmom for [name] to call me back to discuss the home health.

## 2018-05-02 ENCOUNTER — Encounter: Payer: Self-pay | Admitting: Family Medicine

## 2018-05-02 ENCOUNTER — Ambulatory Visit (INDEPENDENT_AMBULATORY_CARE_PROVIDER_SITE_OTHER): Payer: Medicare HMO | Admitting: Family Medicine

## 2018-05-02 VITALS — BP 128/72 | HR 65 | Temp 98.5°F | Wt 177.1 lb

## 2018-05-02 DIAGNOSIS — L309 Dermatitis, unspecified: Secondary | ICD-10-CM

## 2018-05-02 MED ORDER — TRIAMCINOLONE ACETONIDE 0.1 % EX CREA: 1.0000 "application " | TOPICAL_CREAM | Freq: Two times a day (BID) | CUTANEOUS | 5 refills | Status: DC

## 2018-05-02 NOTE — Telephone Encounter (Signed)
Pt is coming in for appt today with Dr. Sarajane Jews and this will be discussed.

## 2018-05-02 NOTE — Progress Notes (Signed)
   Subjective:    Patient ID: Luis Buba., male    DOB: 1927-07-11, 85 y.o.   MRN: [id number]  HPI Here to check a spot on the left lower leg that appeared several months ago. It has been slowly enlarging. It itches but is not painful.    Review of Systems  Constitutional: Negative.   Respiratory: Negative.   Cardiovascular: Negative.   Skin: Positive for rash.       Objective:   Physical Exam Constitutional:      Appearance: Normal appearance.  Cardiovascular:     Rate and Rhythm: Normal rate and regular rhythm.     Pulses: Normal pulses.     Heart sounds: Normal heart sounds.  Pulmonary:     Effort: Pulmonary effort is normal.     Breath sounds: Normal breath sounds.  Musculoskeletal:     Comments: Right lower leg has 1+ edema, left lower leg has 2+ edema   Skin:    Comments: Left lower anterior leg has a macular area 3 cm wide with erythema and scaling   Neurological:     Mental Status: He is alert.           Assessment & Plan:  Eczema, treat with Triamcinolone cream prn.  Alysia Penna, MD

## 2018-05-04 DIAGNOSIS — C61 Malignant neoplasm of prostate: Secondary | ICD-10-CM | POA: Diagnosis not present

## 2018-06-05 ENCOUNTER — Telehealth: Payer: Self-pay | Admitting: Oncology

## 2018-06-05 NOTE — Telephone Encounter (Signed)
Changed appt on 5/1 to telephone call per 4/9 sch message - unable to reach pt - left message for patient that appt has been changed and pt will be called.

## 2018-06-14 ENCOUNTER — Telehealth: Payer: Self-pay | Admitting: Oncology

## 2018-06-14 NOTE — Telephone Encounter (Signed)
Patient returned phone call regarding voicemail that was sent, patient would like cancel upcoming appointments due to CT scan being cancelled and concerns of COVID-19. Patient will call when ready to reschedule.   Message to provider.

## 2018-06-14 NOTE — Telephone Encounter (Signed)
Noted  

## 2018-06-14 NOTE — Telephone Encounter (Signed)
Returned patient's phone call regarding cancelling an appointment, left patient a voicemail.

## 2018-06-21 ENCOUNTER — Ambulatory Visit (HOSPITAL_COMMUNITY): Admission: RE | Admit: 2018-06-21 | Payer: Medicare HMO | Source: Ambulatory Visit

## 2018-06-21 ENCOUNTER — Other Ambulatory Visit: Payer: Medicare HMO

## 2018-06-23 ENCOUNTER — Ambulatory Visit: Payer: Medicare HMO | Admitting: Oncology

## 2018-07-28 ENCOUNTER — Other Ambulatory Visit: Payer: Self-pay | Admitting: Radiation Oncology

## 2018-07-28 ENCOUNTER — Telehealth: Payer: Self-pay | Admitting: Radiation Oncology

## 2018-07-28 NOTE — Telephone Encounter (Signed)
Received voicemail message from patient requesting refill of Lomotil. Phoned patient back. Explained that per Freeman Caldron, PA-C since he is no longer under our care the Lomotil refill will need to come from Dr. Alen Blew or his PCP, Dr. Sarajane Jews. Explained that per Ashlyn Bruning, PA-C the persistent diarrhea at this point is not related to his prior XRT since it has been greater than 6 months since he completed treatment. Listened to patient expressed frustration about  Not receiving a refill of his prescription for approximately 12 minutes. Reinforced that his health is of the utmost importance to all at [facility]. Reinforced that if he is continuing to have diarrhea this far out from radiation therapy the cause needs to be determined. Patient denies diarrhea but is concerned he may develop it if he stops taking the Lomotil. Patient insist he receive more pills of Lomotil "to taper off." Attempted to educate patient again. Ultimately, explained I would relay his desire for "a taper" to the providers and further directions would be called to him once obtained.

## 2018-07-28 NOTE — Telephone Encounter (Signed)
-----   Message from Freeman Caldron, [place] sent at 07/28/2018  3:52 PM EDT ----- Regarding: RE: Refill request Please let patient know that since he is no longer under our care, this will need to be filled by either Dr. Alen Blew or his PCP, Dr. Sarajane Jews.  It is also unlikely that the persistent diarrhea at this point would be related to his prior XRT since it has been greater than 6 months since he completed treatment.  -Ashlyn ----- Message ----- From: Heywood Footman, RN Sent: 07/28/2018   3:44 PM EDT To: Tyler Pita, MD, Freeman Caldron, PA-C Subject: Refill request                                 Ashlyn.   I received an inbasket refill request for this patient's Lomotil. In addition, the patient left a voicemail message "hoping we would reauthorize this prescription.   Diagnosis:   85 y.o. gentleman with progressive locally advanced prostate cancer  Interval Since Last Radiation:  5.5 weeks  11/14/2017 to 12/09/2017:The prostate was treated to 60Gy in 31fractions of 3Gy  The original script was written by Dr. Tammi Klippel 12/09/17. You show him last on 01/18/2018.   [facility] pharmacy [facility] preference [facility] [address] [phone]).   He is also a patient of [facility].   Sam

## 2018-07-30 ENCOUNTER — Other Ambulatory Visit: Payer: Self-pay | Admitting: Radiation Oncology

## 2018-07-30 MED ORDER — DIPHENOXYLATE-ATROPINE 2.5-0.025 MG PO TABS: 1.0000 | ORAL_TABLET | Freq: Four times a day (QID) | ORAL | 5 refills | Status: DC | PRN

## 2018-07-31 ENCOUNTER — Other Ambulatory Visit: Payer: Self-pay | Admitting: Radiation Oncology

## 2018-07-31 ENCOUNTER — Telehealth: Payer: Self-pay | Admitting: Radiation Oncology

## 2018-07-31 MED ORDER — DIPHENOXYLATE-ATROPINE 2.5-0.025 MG PO TABS: 1.0000 | ORAL_TABLET | Freq: Four times a day (QID) | ORAL | 5 refills | Status: DC | PRN

## 2018-07-31 NOTE — Telephone Encounter (Signed)
Received voicemail message from patient requesting return call. Phoned patient back. Patient request Lomotil script be transferred from [place] to [place] at [facility]. Confirmed this RN would manage that transfer. Patient verbalized understanding and expressed appreciation for the return call.

## 2018-07-31 NOTE — Telephone Encounter (Signed)
When attempting to transfer pharmacies the script printed. This RN phoned Larene Beach, pharmacist, at [place], [place] and provided verbal order as prescribed per Dr. Tammi Klippel.

## 2018-07-31 NOTE — Telephone Encounter (Signed)
Phoned patient's home to make him aware Lomotil refill was sent to [place]. No answer. Left detailed message.

## 2018-08-10 DIAGNOSIS — C61 Malignant neoplasm of prostate: Secondary | ICD-10-CM | POA: Diagnosis not present

## 2018-08-16 DIAGNOSIS — C61 Malignant neoplasm of prostate: Secondary | ICD-10-CM | POA: Diagnosis not present

## 2018-08-17 DIAGNOSIS — Z1159 Encounter for screening for other viral diseases: Secondary | ICD-10-CM | POA: Diagnosis not present

## 2018-08-17 DIAGNOSIS — Z01812 Encounter for preprocedural laboratory examination: Secondary | ICD-10-CM | POA: Diagnosis not present

## 2018-08-17 DIAGNOSIS — N135 Crossing vessel and stricture of ureter without hydronephrosis: Secondary | ICD-10-CM | POA: Diagnosis not present

## 2018-08-23 DIAGNOSIS — I10 Essential (primary) hypertension: Secondary | ICD-10-CM | POA: Diagnosis not present

## 2018-08-23 DIAGNOSIS — N135 Crossing vessel and stricture of ureter without hydronephrosis: Secondary | ICD-10-CM | POA: Diagnosis not present

## 2018-08-23 DIAGNOSIS — C61 Malignant neoplasm of prostate: Secondary | ICD-10-CM | POA: Diagnosis not present

## 2018-08-23 DIAGNOSIS — N35912 Unspecified bulbous urethral stricture, male: Secondary | ICD-10-CM | POA: Diagnosis not present

## 2018-09-08 DIAGNOSIS — C61 Malignant neoplasm of prostate: Secondary | ICD-10-CM | POA: Diagnosis not present

## 2018-09-14 ENCOUNTER — Encounter (HOSPITAL_COMMUNITY): Payer: Self-pay | Admitting: Emergency Medicine

## 2018-09-14 ENCOUNTER — Emergency Department (HOSPITAL_COMMUNITY): Payer: Medicare HMO

## 2018-09-14 ENCOUNTER — Emergency Department (HOSPITAL_COMMUNITY)
Admission: EM | Admit: 2018-09-14 | Discharge: 2018-09-14 | Disposition: A | Discharge status: Home / Self Care | Payer: Medicare HMO | Attending: Emergency Medicine | Admitting: Emergency Medicine

## 2018-09-14 ENCOUNTER — Other Ambulatory Visit: Payer: Self-pay

## 2018-09-14 DIAGNOSIS — Y9301 Activity, walking, marching and hiking: Secondary | ICD-10-CM | POA: Diagnosis not present

## 2018-09-14 DIAGNOSIS — I5032 Chronic diastolic (congestive) heart failure: Secondary | ICD-10-CM | POA: Diagnosis not present

## 2018-09-14 DIAGNOSIS — I25110 Atherosclerotic heart disease of native coronary artery with unstable angina pectoris: Secondary | ICD-10-CM | POA: Diagnosis not present

## 2018-09-14 DIAGNOSIS — W19XXXA Unspecified fall, initial encounter: Secondary | ICD-10-CM

## 2018-09-14 DIAGNOSIS — S199XXA Unspecified injury of neck, initial encounter: Secondary | ICD-10-CM | POA: Diagnosis not present

## 2018-09-14 DIAGNOSIS — M25551 Pain in right hip: Secondary | ICD-10-CM | POA: Diagnosis not present

## 2018-09-14 DIAGNOSIS — S0990XA Unspecified injury of head, initial encounter: Secondary | ICD-10-CM | POA: Diagnosis not present

## 2018-09-14 DIAGNOSIS — S40811A Abrasion of right upper arm, initial encounter: Secondary | ICD-10-CM | POA: Diagnosis not present

## 2018-09-14 DIAGNOSIS — M75101 Unspecified rotator cuff tear or rupture of right shoulder, not specified as traumatic: Secondary | ICD-10-CM | POA: Diagnosis not present

## 2018-09-14 DIAGNOSIS — Z7982 Long term (current) use of aspirin: Secondary | ICD-10-CM | POA: Diagnosis not present

## 2018-09-14 DIAGNOSIS — T148XXA Other injury of unspecified body region, initial encounter: Secondary | ICD-10-CM

## 2018-09-14 DIAGNOSIS — M67911 Unspecified disorder of synovium and tendon, right shoulder: Secondary | ICD-10-CM | POA: Diagnosis not present

## 2018-09-14 DIAGNOSIS — Y999 Unspecified external cause status: Secondary | ICD-10-CM | POA: Diagnosis not present

## 2018-09-14 DIAGNOSIS — I110 Hypertensive heart disease with heart failure: Secondary | ICD-10-CM | POA: Insufficient documentation

## 2018-09-14 DIAGNOSIS — Y92009 Unspecified place in unspecified non-institutional (private) residence as the place of occurrence of the external cause: Secondary | ICD-10-CM | POA: Insufficient documentation

## 2018-09-14 DIAGNOSIS — M25511 Pain in right shoulder: Secondary | ICD-10-CM | POA: Insufficient documentation

## 2018-09-14 DIAGNOSIS — S79911A Unspecified injury of right hip, initial encounter: Secondary | ICD-10-CM | POA: Diagnosis not present

## 2018-09-14 DIAGNOSIS — M25552 Pain in left hip: Secondary | ICD-10-CM | POA: Diagnosis not present

## 2018-09-14 DIAGNOSIS — S4991XA Unspecified injury of right shoulder and upper arm, initial encounter: Secondary | ICD-10-CM | POA: Diagnosis not present

## 2018-09-14 DIAGNOSIS — S50311A Abrasion of right elbow, initial encounter: Secondary | ICD-10-CM | POA: Diagnosis not present

## 2018-09-14 DIAGNOSIS — W01198A Fall on same level from slipping, tripping and stumbling with subsequent striking against other object, initial encounter: Secondary | ICD-10-CM | POA: Insufficient documentation

## 2018-09-14 MED ORDER — LIDOCAINE 5 % EX PTCH: 2.0000 | MEDICATED_PATCH | CUTANEOUS | 0 refills | Status: DC

## 2018-09-14 NOTE — ED Provider Notes (Signed)
[facility] DEPT Provider Note   CSN: [id number] Arrival date & time: 09/14/18  1214    History   Chief Complaint Chief Complaint  Patient presents with   Fall   Arm Pain   Hip Pain    HPI [name]. is a 85 y.o. male.     HPI   85yo male with history of CAD, htn, atrial fibrillation s/p maze/mitral valve repair not on anticoagulation, bilateral pleural effusions, prostate cancer, recent cystoscopy/ureteroscopy and stent placement 7/1 for ureteral stricture, presents with concern for fall.  Reports Monday night around midnight he got up to go to the bathroom in the dark and did not use his walker and tripped and fell. His elbow hit a picture frame that was leaning on the wall and he had abrasions taht he has placed antibiotic ointment over and are healing well.  Reports since the fall his right shoulder pain and hip pain seem to be getting worse rather than better.  He has been ambulatory since then. Denies numbness, weakness, specific neck pain or headaches. Reports he did hit his head but no LOC. Is on ASA.  Denies other acute illness or concerns.  Pain in right shoulder is worse with movements, preventing him from fully lifting arm. Also has pain in right hip with lifting it. Pain 7/10. utd tetanus  Past Medical History:  Diagnosis Date   Complication of anesthesia    "serious cognisance problems since my OR in January" (03/13/2013   Coronary artery disease    Hx of echocardiogram    Echo 5/16:  Mild focal basal septal hypertrophy, EF 55%, mild AI, MV repair ok with trivial MR (mean 3 mmHg), mild LAE, mild RAE, PASP 35 mmHg   Hypertension    Irritable bowel syndrome    PAF (paroxysmal atrial fibrillation) ([place]) 05/03/2014   Personal history of colonic polyps    tubular adenoma   Pleural effusion, bilateral 03/12/2013   Small to moderate, L>R   Prostate cancer [facility]) 1991   sees Dr. Carlota Raspberry at [facility], observation only     S/P CABG x 2 02/28/2013   LIMA to LAD, SVG to OM1, EVH via right thigh   S/P Maze operation for atrial fibrillation 02/28/2013   Complete bilateral atrial lesion set using bipolar radiofrequency and cryothermy ablation with clipping of LA appendage   S/P mitral valve repair, maze procedure, and CABG x2 02/28/2013   Complex valvuloplasty including triangular resection of posterior leaflet, 30 mm Sorin Memo 3D ring annuloplasty    Patient Active Problem List   Diagnosis Date Noted   PVC's (premature ventricular contractions) 11/07/2017   Urinary retention 02/04/2017   PAF (paroxysmal atrial fibrillation), CHA2DS2VASc score 4 05/03/2014   Dizziness 05/03/2014   Encounter for therapeutic drug monitoring 05/07/2013   S/P CABG x 2 03/26/2013   S/P MVR (mitral valve repair) 03/26/2013   S/P Maze operation for atrial fibrillation 03/26/2013   Current use of long term anticoagulation 03/16/2013   Chronic diastolic CHF (congestive heart failure) ([place]) 03/12/2013   Anemia- post op 03/12/2013   Pre-syncope 03/12/2013   Pleural effusion, bilateral 03/12/2013   Delirium 03/12/2013   CAD (coronary artery disease) 03/06/2013   S/P mitral valve repair, maze procedure, and CABG x2 02/29/12 02/28/2013   Unstable angina ([place]) 02/26/2013   Special screening for malignant neoplasms, colon 10/05/2010   Benign neoplasm of colon 10/05/2010   Diverticulosis of colon (without mention of hemorrhage) 10/05/2010   ADENOCARCINOMA, PROSTATE, GLEASON  GRADE 3 08/13/2009   SEBACEOUS CYST 08/13/2009   Hypothyroidism 08/02/2007   GERD 08/02/2007   OSTEOPENIA 08/02/2007   UNS ADVRS EFF OTH RX MEDICINAL&BIOLOGICAL SBSTNC 08/02/2007   HLD (hyperlipidemia) 12/15/2006   PROSTATE SPECIFIC ANTIGEN, ELEVATED 12/15/2006   Essential hypertension 09/05/2006   ARTHRITIS 09/05/2006   COLONIC POLYPS, HX OF 09/05/2006    Past Surgical History:  Procedure Laterality Date   CARDIAC CATHETERIZATION   2015   CATARACT EXTRACTION, BILATERAL Bilateral 2014   per Dr. Gershon Crane    COLONOSCOPY  2011   per Dr. Sharlett Iles, benign polyps, no repeats needed    CORONARY ARTERY BYPASS GRAFT N/A 02/28/2013   Procedure: CORONARY ARTERY BYPASS GRAFTING (CABG);  Surgeon: Rexene Alberts, MD;  Location: [facility];  Service: Open Heart Surgery;  Laterality: N/A;  CABG x 2,  using left internal mammary artery and right leg saphenous vein harvested endoscopically   HEMORRHOID SURGERY     INTRAOPERATIVE TRANSESOPHAGEAL ECHOCARDIOGRAM N/A 02/28/2013   Procedure: INTRAOPERATIVE TRANSESOPHAGEAL ECHOCARDIOGRAM;  Surgeon: Rexene Alberts, MD;  Location: [facility];  Service: Open Heart Surgery;  Laterality: N/A;   LEFT HEART CATHETERIZATION WITH CORONARY ANGIOGRAM N/A 02/26/2013   Procedure: LEFT HEART CATHETERIZATION WITH CORONARY ANGIOGRAM;  Surgeon: Leonie Man, MD;  Location: [facility] CATH LAB;  Service: Cardiovascular;  Laterality: N/A;   MAZE N/A 02/28/2013   Procedure: MAZE;  Surgeon: Rexene Alberts, MD;  Location: [facility];  Service: Open Heart Surgery;  Laterality: N/A;   MITRAL VALVE REPAIR N/A 02/28/2013   Procedure: MITRAL VALVE REPAIR (MVR);  Surgeon: Rexene Alberts, MD;  Location: [facility];  Service: Open Heart Surgery;  Laterality: N/A;   PROSTATE BIOPSY     TRANSURETHRAL RESECTION OF PROSTATE N/A 02/04/2017   Procedure: TRANSURETHRAL RESECTION OF THE PROSTATE (TURP);  Surgeon: Alexis Frock, MD;  Location: [facility];  Service: Urology;  Laterality: N/A;        Home Medications    Prior to Admission medications   Medication Sig Start Date End Date Taking? Authorizing Provider  amoxicillin (AMOXIL) 500 MG capsule Take 2,000 mg by mouth as directed. Prior to dental appointment 07/30/14   [provider]  aspirin EC 81 MG tablet Take 1 tablet (81 mg total) by mouth daily. 11/07/17   Fay Records, MD  atorvastatin (LIPITOR) 10 MG tablet TAKE 1 TABLET BY MOUTH EVERY DAY AT 6:00 PM 01/30/18   [name], MD    Cholecalciferol (VITAMIN D3 PO) Take 2,000 Units by mouth daily.    [provider]  ciprofloxacin (CIPRO) 500 MG tablet Take 500 mg by mouth 2 (two) times daily.  04/19/18   [provider]  diclofenac sodium (VOLTAREN) 1 % GEL Apply 2 g topically 4 (four) times daily. 04/23/18   Khatri, Hina, PA-C  diphenoxylate-atropine (LOMOTIL) 2.5-0.025 MG tablet TAKE 1 TABLET BY MOUTH 4 TIMES DAILY AS NEEDED FOR  DIARRHEA  OR  LOOSE  STOOLS  (AT  Otho) 08/01/18   Tyler Pita, MD  fish oil-omega-3 fatty acids 1000 MG capsule Take 1 g by mouth every morning.     [provider]  Leuprolide Acetate, 6 Month, (LUPRON) 45 MG injection Inject 45 mg into the muscle every 6 (six) months.     [provider]  levothyroxine (SYNTHROID, LEVOTHROID) 50 MCG tablet Take 1 tablet (50 mcg total) by mouth every morning. 01/30/18   [name], MD  lidocaine (LIDODERM) 5 % Place 2  patches onto the skin daily. Remove & Discard patch within 12 hours or as directed by MD on shoulder and hip. 09/14/18   Gareth Morgan, MD  meclizine (ANTIVERT) 25 MG tablet Take 1 tablet (25 mg total) by mouth 3 (three) times daily as needed. Patient taking differently: Take 25 mg by mouth 3 (three) times daily as needed for dizziness.  05/03/14   Glendell Docker, NP  metoprolol tartrate (LOPRESSOR) 25 MG tablet TAKE ONE TABLET BY MOUTH TWICE A DAY 04/18/18   [name], MD  nitroGLYCERIN (NITROSTAT) 0.4 MG SL tablet Place 1 tablet (0.4 mg total) under the tongue every 5 (five) minutes as needed for chest pain. 03/17/15   Fay Records, MD  triamcinolone cream (KENALOG) 0.1 % Apply 1 application topically 2 (two) times daily. 05/02/18   [name], MD  vitamin C (ASCORBIC ACID) 500 MG tablet Take 500 mg by mouth every morning.     [provider]    Family History Family History  Problem Relation Age of Onset   Hypertension Mother    Sudden death Mother    Hypertension  Father    Sudden death Father    Heart disease Son    Diabetes Other    Hypertension Other    Sudden death Other    Heart disease Other    Stroke Other    Lung cancer Brother    Stomach cancer Brother    Cancer Daughter    Cancer Brother    Diabetes Sister     Social History Social History   Tobacco Use   Smoking status: Never Smoker   Smokeless tobacco: Never Used  Substance Use Topics   Alcohol use: No    Alcohol/week: 0.0 standard drinks   Drug use: No     Allergies   Other   Review of Systems Review of Systems  Constitutional: Negative for fever.  Eyes: Negative for visual disturbance.  Respiratory: Negative for shortness of breath.   Cardiovascular: Negative for chest pain.  Gastrointestinal: Negative for abdominal pain, nausea and vomiting.  Genitourinary: Negative for difficulty urinating.  Musculoskeletal: Positive for arthralgias. Negative for back pain, neck pain and neck stiffness.  Skin: Negative for rash.  Neurological: Negative for syncope, weakness, numbness and headaches.     Physical Exam Updated Vital Signs BP 124/86 (BP Location: Left Arm)    Pulse 88    Temp 98.9 F (37.2 C) (Oral)    Resp 20    SpO2 97%   Physical Exam Vitals signs and nursing note reviewed.  Constitutional:      General: He is not in acute distress.    Appearance: He is well-developed. He is not diaphoretic.  HENT:     Head: Normocephalic and atraumatic.  Eyes:     Conjunctiva/sclera: Conjunctivae normal.  Neck:     Musculoskeletal: Normal range of motion.  Cardiovascular:     Rate and Rhythm: Normal rate and regular rhythm.     Pulses: Normal pulses.  Pulmonary:     Effort: Pulmonary effort is normal. No respiratory distress.  Abdominal:     General: There is no distension.  Musculoskeletal:     Right shoulder: He exhibits decreased range of motion, tenderness and bony tenderness. He exhibits no swelling, no deformity, no laceration and  normal pulse. Decreased strength: limited by pain.     Right hip: He exhibits tenderness. He exhibits normal range of motion (pain with ROM), no crepitus and no deformity.  Skin:    General: Skin is warm and dry.     Comments: Healing abrasions right elbow  Neurological:     Mental Status: He is alert and oriented to person, place, and time.     Comments: 5/5 strength upper and LE with shoulder flexion and hip flexion on right limited by pain.       ED Treatments / Results  Labs (all labs ordered are listed, but only abnormal results are displayed) Labs Reviewed - No data to display  EKG None  Radiology Dg Shoulder Right  Result Date: 09/14/2018 CLINICAL DATA:  Pain right-sided body after fall. EXAM: RIGHT SHOULDER - 2+ VIEW COMPARISON:  None. FINDINGS: There is no evidence of fracture or dislocation. There is no evidence of arthropathy or other focal bone abnormality. Soft tissues are unremarkable. IMPRESSION: Negative. Electronically Signed   By: Dorise Bullion III M.D   On: 09/14/2018 13:56   Ct Head Wo Contrast  Result Date: 09/14/2018 CLINICAL DATA:  Fall this morning walk to the bathroom. Patient on daily aspirin. Hip back of head. EXAM: CT HEAD WITHOUT CONTRAST CT CERVICAL SPINE WITHOUT CONTRAST TECHNIQUE: Multidetector CT imaging of the head and cervical spine was performed following the standard protocol without intravenous contrast. Multiplanar CT image reconstructions of the cervical spine were also generated. COMPARISON:  Head CT 05/03/2014 and CT cervical spine 04/23/2018 FINDINGS: CT HEAD FINDINGS Brain: Ventricles, cisterns and other CSF spaces are unchanged as there is minimal age related atrophy. There is mild chronic ischemic microvascular disease. There is no mass, mass effect, shift of midline structures or acute hemorrhage. No evidence of acute infarction. Couple tiny old right-sided lacunar infarcts over the basal ganglia. Vascular: No hyperdense vessel or unexpected  calcification. Skull: Normal. Negative for fracture or focal lesion. Sinuses/Orbits: No acute finding. Other: None. CT CERVICAL SPINE FINDINGS Alignment: Subtle anterior subluxation of C5 and C6 unchanged and likely degenerate degenerative due to the moderate facet arthropathy. Skull base and vertebrae: Vertebral body heights are maintained. There is moderate spondylosis throughout the cervical spine. There is moderate uncovertebral joint spurring and facet arthropathy. Atlantoaxial articulation is within normal. No acute fracture or traumatic subluxation. Moderate right-sided neural foraminal narrowing at the C3-4 level, bilateral neural foraminal narrowing at the C4-5 level right greater than left, moderate bilateral neural foraminal narrowing at the C5-6 level, significant left-sided neural foraminal narrowing at the C6-7 level. Soft tissues and spinal canal: No prevertebral fluid or swelling. No visible canal hematoma. Disc levels: Moderate disc space narrowing from the C3-4 level to the C6-7 level. Upper chest: No acute findings. Other: None. IMPRESSION: No acute brain injury. Chronic ischemic microvascular disease and age related atrophic change. Couple tiny old lacunar infarcts over the right basal ganglia. No acute cervical spine injury. Moderate spondylosis throughout the cervical spine with moderate multilevel disc disease and moderate multilevel significant bilateral neural foraminal narrowing as described. Electronically Signed   By: Marin Olp M.D.   On: 09/14/2018 14:15   Ct Cervical Spine Wo Contrast  Result Date: 09/14/2018 CLINICAL DATA:  Fall this morning walk to the bathroom. Patient on daily aspirin. Hip back of head. EXAM: CT HEAD WITHOUT CONTRAST CT CERVICAL SPINE WITHOUT CONTRAST TECHNIQUE: Multidetector CT imaging of the head and cervical spine was performed following the standard protocol without intravenous contrast. Multiplanar CT image reconstructions of the cervical spine were  also generated. COMPARISON:  Head CT 05/03/2014 and CT cervical spine 04/23/2018 FINDINGS: CT HEAD FINDINGS Brain: Ventricles, cisterns and other  CSF spaces are unchanged as there is minimal age related atrophy. There is mild chronic ischemic microvascular disease. There is no mass, mass effect, shift of midline structures or acute hemorrhage. No evidence of acute infarction. Couple tiny old right-sided lacunar infarcts over the basal ganglia. Vascular: No hyperdense vessel or unexpected calcification. Skull: Normal. Negative for fracture or focal lesion. Sinuses/Orbits: No acute finding. Other: None. CT CERVICAL SPINE FINDINGS Alignment: Subtle anterior subluxation of C5 and C6 unchanged and likely degenerate degenerative due to the moderate facet arthropathy. Skull base and vertebrae: Vertebral body heights are maintained. There is moderate spondylosis throughout the cervical spine. There is moderate uncovertebral joint spurring and facet arthropathy. Atlantoaxial articulation is within normal. No acute fracture or traumatic subluxation. Moderate right-sided neural foraminal narrowing at the C3-4 level, bilateral neural foraminal narrowing at the C4-5 level right greater than left, moderate bilateral neural foraminal narrowing at the C5-6 level, significant left-sided neural foraminal narrowing at the C6-7 level. Soft tissues and spinal canal: No prevertebral fluid or swelling. No visible canal hematoma. Disc levels: Moderate disc space narrowing from the C3-4 level to the C6-7 level. Upper chest: No acute findings. Other: None. IMPRESSION: No acute brain injury. Chronic ischemic microvascular disease and age related atrophic change. Couple tiny old lacunar infarcts over the right basal ganglia. No acute cervical spine injury. Moderate spondylosis throughout the cervical spine with moderate multilevel disc disease and moderate multilevel significant bilateral neural foraminal narrowing as described. Electronically  Signed   By: Marin Olp M.D.   On: 09/14/2018 14:15   Dg Hip Unilat W Or Wo Pelvis 2-3 Views Right  Result Date: 09/14/2018 CLINICAL DATA:  Pain after fall EXAM: DG HIP (WITH OR WITHOUT PELVIS) 2-3V RIGHT COMPARISON:  None. FINDINGS: A left ureteral stent terminates in the lower bladder to the right of midline. Vascular calcifications are noted. No fractures are identified. IMPRESSION: No fractures noted. Electronically Signed   By: Dorise Bullion III M.D   On: 09/14/2018 13:57    Procedures Procedures (including critical care time)  Medications Ordered in ED Medications - No data to display   Initial Impression / Assessment and Plan / ED Course  I have reviewed the triage vital signs and the nursing notes.  Pertinent labs & imaging results that were available during my care of the patient were reviewed by me and considered in my medical decision making (see chart for details).         85yo male with history of CAD, htn, atrial fibrillation s/p maze/mitral valve repair not on anticoagulation, bilateral pleural effusions, prostate cancer, recent cystoscopy/ureteroscopy and stent placement 7/1 for ureteral stricture, presents with concern for mechanical fall 3 nights ago with pain in right shoulder and right hip.  Given pt on ASA, age and head trauma, CT ordered showing no acute findings.  CT CSpine done given shoulder pain and shows no acute findings.   XR right shoulder and right hip show no sign of fracture.  Denies other injuries, no sign of other acute illness by history or exam.  Suspect contusions as etiology of pain and likely rotator cuff pathology of right shoulder. Recommend PCP follow up. Given sling for comfort but recommend ROM exercises. Given rx for lidocaine patch, rec tylenol. Patient discharged in stable condition with understanding of reasons to return.    Final Clinical Impressions(s) / ED Diagnoses   Final diagnoses:  Fall, initial encounter  Right hip pain    Acute pain of right shoulder  Abrasion  Rotator cuff disorder, right    ED Discharge Orders         Ordered    lidocaine (LIDODERM) 5 %  Every 24 hours     09/14/18 1458           Gareth Morgan, MD 09/14/18 2237

## 2018-09-14 NOTE — Discharge Instructions (Signed)
Take Tylenol 1000 mg 4 times a day for 1 week. This is the maximum dose of Tylenol (acetaminophen) you can take from all sources. Please check other over-the-counter medications and prescriptions to ensure you are not taking other medications that contain acetaminophen.

## 2018-09-14 NOTE — ED Triage Notes (Signed)
Pt reports around midnight this morning he was going to bathroom when he fell. He c/o right hip, right shoulder and elbow pains. Denies LOC. Pt takes ASA 81mg  daily

## 2018-09-14 NOTE — ED Notes (Signed)
Pt is restless in the bed and has attempted to stand and required redirection, and pt states that he has a cramp in his leg, pt encouraged to stay in bed.

## 2018-09-27 ENCOUNTER — Other Ambulatory Visit: Payer: Self-pay

## 2018-09-27 ENCOUNTER — Encounter: Payer: Self-pay | Admitting: Family Medicine

## 2018-09-27 ENCOUNTER — Telehealth (INDEPENDENT_AMBULATORY_CARE_PROVIDER_SITE_OTHER): Payer: Medicare HMO | Admitting: Family Medicine

## 2018-09-27 DIAGNOSIS — M25551 Pain in right hip: Secondary | ICD-10-CM

## 2018-09-27 DIAGNOSIS — M25511 Pain in right shoulder: Secondary | ICD-10-CM | POA: Diagnosis not present

## 2018-09-27 NOTE — Progress Notes (Signed)
Virtual Visit via Telephone Note  I connected with the patient on 09/27/18 at  2:30 PM EDT by telephone and verified that I am speaking with the correct person using two identifiers. We attempted to connect virtually but we had technical difficulties with the audio and video.     I discussed the limitations, risks, security and privacy concerns of performing an evaluation and management service by telephone and the availability of in person appointments. I also discussed with the patient that there may be a patient responsible charge related to this service. The patient expressed understanding and agreed to proceed.  Location patient: home Location provider: work or home office Participants present for the call: patient, provider Patient did not have a visit in the prior 7 days to address this/these issue(s).   History of Present Illness: Herewith his wife and daughter to follow up an ER visit on 09-14-18 for injuries from a fall. The night before he had gotten up from bed to use the bathroom without using his walker and without turning on the lights. He tripped and fell, striking his head and landing on his right side. No LOC. He had pain in the right shoulder and right hip. At the ER a CT of the head and a CT of the cervical spine were unremarkable. Xrays of the right shoulder and right hip also looked good with no fractures. Since then he has had stiffness and pain these joints which makes it difficult to dress, use the bathrooom, and to help care for his wife. He is using Lidocaine patches and taking Tylenol.    Observations/Objective: Patient sounds cheerful and well on the phone. I do not appreciate any SOB. Speech and thought processing are grossly intact. Patient reported vitals:  Assessment and Plan: He has pain from contusions to the right shoulder and right hip. He and his wife live at [facility]. We will arrange for Home Health to come and provide PT to aid his recovery.   Alysia Penna, MD   Follow Up Instructions:     [phone] 11-20 [id number] 21-30 I did not refer this patient for an OV in the next 24 hours for this/these issue(s).  I discussed the assessment and treatment plan with the patient. The patient was provided an opportunity to ask questions and all were answered. The patient agreed with the plan and demonstrated an understanding of the instructions.   The patient was advised to call back or seek an in-person evaluation if the symptoms worsen or if the condition fails to improve as anticipated.  I provided 22 minutes of non-face-to-face time during this encounter.   Alysia Penna, MD

## 2018-09-28 ENCOUNTER — Other Ambulatory Visit: Payer: Self-pay | Admitting: Family Medicine

## 2018-09-28 DIAGNOSIS — M25551 Pain in right hip: Secondary | ICD-10-CM

## 2018-09-28 DIAGNOSIS — M25511 Pain in right shoulder: Secondary | ICD-10-CM

## 2018-10-07 ENCOUNTER — Other Ambulatory Visit: Payer: Self-pay | Admitting: Family Medicine

## 2018-11-09 ENCOUNTER — Telehealth: Payer: Self-pay

## 2018-11-09 NOTE — Telephone Encounter (Signed)
Please advise 

## 2018-11-09 NOTE — Telephone Encounter (Signed)
Copied from [place] [id number]. Topic: General - Other >> Nov 09, 2018 10:35 AM Leward Quan A wrote: Reason for CRM: Patient daughter called to say that they decided to use [facility] for their home care needs but need an order faxed over from Dr Sarajane Jews. Please fax to Fax# [phone]

## 2018-11-10 NOTE — Telephone Encounter (Signed)
Please get some more info about this

## 2018-11-21 ENCOUNTER — Other Ambulatory Visit: Payer: Self-pay

## 2018-11-21 ENCOUNTER — Ambulatory Visit (INDEPENDENT_AMBULATORY_CARE_PROVIDER_SITE_OTHER): Payer: Medicare HMO

## 2018-11-21 DIAGNOSIS — Z23 Encounter for immunization: Secondary | ICD-10-CM

## 2018-11-23 NOTE — Telephone Encounter (Signed)
Franco Collet, CMA  Nonah Mattes A 13 days ago   Hey do we need to send another referral over if we already sent one. The agency changed that's it? Please advise if you have any answers thank you   Message text    Has this been taken care of? If not, please do. If so, please document.

## 2018-11-24 DIAGNOSIS — N390 Urinary tract infection, site not specified: Secondary | ICD-10-CM | POA: Diagnosis not present

## 2018-11-24 DIAGNOSIS — C61 Malignant neoplasm of prostate: Secondary | ICD-10-CM | POA: Diagnosis not present

## 2018-11-27 ENCOUNTER — Ambulatory Visit: Payer: Medicare HMO

## 2018-11-30 NOTE — Telephone Encounter (Signed)
Faxed the referral to [facility] for [facility]  [phone]

## 2018-12-01 DIAGNOSIS — Z01812 Encounter for preprocedural laboratory examination: Secondary | ICD-10-CM | POA: Diagnosis not present

## 2018-12-01 DIAGNOSIS — Z20828 Contact with and (suspected) exposure to other viral communicable diseases: Secondary | ICD-10-CM | POA: Diagnosis not present

## 2018-12-01 DIAGNOSIS — N135 Crossing vessel and stricture of ureter without hydronephrosis: Secondary | ICD-10-CM | POA: Diagnosis not present

## 2018-12-06 DIAGNOSIS — I447 Left bundle-branch block, unspecified: Secondary | ICD-10-CM | POA: Diagnosis not present

## 2018-12-06 DIAGNOSIS — N2889 Other specified disorders of kidney and ureter: Secondary | ICD-10-CM | POA: Diagnosis not present

## 2018-12-06 DIAGNOSIS — I4891 Unspecified atrial fibrillation: Secondary | ICD-10-CM | POA: Diagnosis not present

## 2018-12-06 DIAGNOSIS — N135 Crossing vessel and stricture of ureter without hydronephrosis: Secondary | ICD-10-CM | POA: Insufficient documentation

## 2018-12-06 DIAGNOSIS — I4519 Other right bundle-branch block: Secondary | ICD-10-CM | POA: Diagnosis not present

## 2018-12-06 DIAGNOSIS — C61 Malignant neoplasm of prostate: Secondary | ICD-10-CM | POA: Diagnosis not present

## 2018-12-06 DIAGNOSIS — N35919 Unspecified urethral stricture, male, unspecified site: Secondary | ICD-10-CM | POA: Diagnosis not present

## 2018-12-06 DIAGNOSIS — I493 Ventricular premature depolarization: Secondary | ICD-10-CM | POA: Diagnosis not present

## 2018-12-07 ENCOUNTER — Other Ambulatory Visit (HOSPITAL_COMMUNITY): Payer: Medicare HMO

## 2018-12-13 ENCOUNTER — Telehealth: Payer: Self-pay | Admitting: Oncology

## 2018-12-13 NOTE — Telephone Encounter (Signed)
Called patient regarding upcoming appointment on 10/26, MD approved of date. Patient is notified.

## 2018-12-14 ENCOUNTER — Ambulatory Visit (HOSPITAL_COMMUNITY)
Admission: RE | Admit: 2018-12-14 | Discharge: 2018-12-14 | Disposition: A | Discharge status: Home / Self Care | Payer: Medicare HMO | Source: Ambulatory Visit | Attending: Oncology | Admitting: Oncology

## 2018-12-14 ENCOUNTER — Other Ambulatory Visit: Payer: Self-pay

## 2018-12-14 DIAGNOSIS — C61 Malignant neoplasm of prostate: Secondary | ICD-10-CM | POA: Insufficient documentation

## 2018-12-18 ENCOUNTER — Telehealth: Payer: Self-pay | Admitting: Oncology

## 2018-12-18 ENCOUNTER — Other Ambulatory Visit: Payer: Self-pay

## 2018-12-18 ENCOUNTER — Inpatient Hospital Stay: Payer: Medicare HMO | Attending: Oncology | Admitting: Oncology

## 2018-12-18 VITALS — BP 124/81 | HR 69 | Temp 97.8°F | Resp 17 | Ht 68.5 in | Wt 167.9 lb

## 2018-12-18 DIAGNOSIS — Z923 Personal history of irradiation: Secondary | ICD-10-CM | POA: Diagnosis not present

## 2018-12-18 DIAGNOSIS — K5730 Diverticulosis of large intestine without perforation or abscess without bleeding: Secondary | ICD-10-CM | POA: Insufficient documentation

## 2018-12-18 DIAGNOSIS — Z791 Long term (current) use of non-steroidal anti-inflammatories (NSAID): Secondary | ICD-10-CM | POA: Diagnosis not present

## 2018-12-18 DIAGNOSIS — Z7982 Long term (current) use of aspirin: Secondary | ICD-10-CM | POA: Insufficient documentation

## 2018-12-18 DIAGNOSIS — K449 Diaphragmatic hernia without obstruction or gangrene: Secondary | ICD-10-CM | POA: Diagnosis not present

## 2018-12-18 DIAGNOSIS — K8020 Calculus of gallbladder without cholecystitis without obstruction: Secondary | ICD-10-CM | POA: Diagnosis not present

## 2018-12-18 DIAGNOSIS — Z79899 Other long term (current) drug therapy: Secondary | ICD-10-CM | POA: Insufficient documentation

## 2018-12-18 DIAGNOSIS — C61 Malignant neoplasm of prostate: Secondary | ICD-10-CM | POA: Diagnosis not present

## 2018-12-18 NOTE — Progress Notes (Signed)
Hematology and Oncology Follow Up Visit  [name] [id number] April 07, 1927 85 y.o. 12/18/2018 10:20 AM [name], MDFry, Luis Holter, MD   Principle Diagnosis: 85 year old man with prostate cancer diagnosed in year 2000 with a Gleason score 3+4 = 7.  He developed a locally advanced disease in December 2018.   Prior Therapy:  He is status post radiation therapy total of 44 Gy in 20 fractions to the pelvic nodes as well as boost to the prostate of 60 Gy 20 fractions.  I was completed in October 2019.  Current therapy:  Androgen deprivation under the care of Puschinsky in [facility].  Interim History: [name] returns today for a repeat evaluation.  Since the last visit, he underwent cystoscopy and stent replacement on December 06, 2018 under the care of Dr. Harlow Asa.  He tolerated the procedure very well without any complications.  He denies any abdominal pain or discomfort.  He continues to have loose bowel habits associated with his previous radiation.  This performance status and quality of life remains reasonable.   He denied headaches, blurry vision, syncope or seizures.  Denies any fevers, chills or sweats.  Denied chest pain, palpitation, orthopnea or leg edema.  Denied cough, wheezing or hemoptysis.  Denied nausea, vomiting or abdominal pain.  Denies any constipation or diarrhea.  Denies any frequency urgency or hesitancy.  Denies any arthralgias or myalgias.  Denies any skin rashes or lesions.  Denies any bleeding or clotting tendency.  Denies any easy bruising.  Denies any hair or nail changes.  Denies any anxiety or depression.  Remaining review of system is negative.       Medications: Reviewed without any changes. Current Outpatient Medications  Medication Sig Dispense Refill  . amoxicillin (AMOXIL) 500 MG capsule Take 2,000 mg by mouth as directed. Prior to dental appointment  1  . aspirin EC 81 MG tablet Take 1 tablet (81 mg total) by mouth daily. 90 tablet 3  .  atorvastatin (LIPITOR) 10 MG tablet TAKE 1 TABLET BY MOUTH EVERY DAY AT 6:00 PM 90 tablet 3  . Cholecalciferol (VITAMIN D3 PO) Take 2,000 Units by mouth daily.    . ciprofloxacin (CIPRO) 500 MG tablet Take 500 mg by mouth 2 (two) times daily.     . diclofenac sodium (VOLTAREN) 1 % GEL Apply 2 g topically 4 (four) times daily. 100 g 0  . diphenoxylate-atropine (LOMOTIL) 2.5-0.025 MG tablet TAKE 1 TABLET BY MOUTH 4 TIMES DAILY AS NEEDED FOR  DIARRHEA  OR  LOOSE  STOOLS  (AT  [facility]  MEAL  TIME) 160 tablet 0  . fish oil-omega-3 fatty acids 1000 MG capsule Take 1 g by mouth every morning.     [name] Leuprolide Acetate, 6 Month, (LUPRON) 45 MG injection Inject 45 mg into the muscle every 6 (six) months.     . levothyroxine (SYNTHROID, LEVOTHROID) 50 MCG tablet Take 1 tablet (50 mcg total) by mouth every morning. 90 tablet 3  . lidocaine (LIDODERM) 5 % Place 2 patches onto the skin daily. Remove & Discard patch within 12 hours or as directed by MD on shoulder and hip. 30 patch 0  . meclizine (ANTIVERT) 25 MG tablet Take 1 tablet (25 mg total) by mouth 3 (three) times daily as needed. (Patient taking differently: Take 25 mg by mouth 3 (three) times daily as needed for dizziness. ) 20 tablet 0  . metoprolol tartrate (LOPRESSOR) 25 MG tablet TAKE ONE TABLET BY MOUTH TWICE A DAY  180 tablet 0  . nitroGLYCERIN (NITROSTAT) 0.4 MG SL tablet Place 1 tablet (0.4 mg total) under the tongue every 5 (five) minutes as needed for chest pain. 25 tablet 3  . triamcinolone cream (KENALOG) 0.1 % Apply 1 application topically 2 (two) times daily. 30 g 5  . vitamin C (ASCORBIC ACID) 500 MG tablet Take 500 mg by mouth every morning.      No current facility-administered medications for this visit.      Allergies:  Allergies  Allergen Reactions  . Other Other (See Comments)    ANESTHESIA.... 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 ANESTHESIA.... 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 Occurred  post op CABG 2015, required readmission and rehab    Past Medical History, Surgical history, Social history, and Family History unchanged on review.  Physical Exam:   Blood pressure 124/81, pulse 69, temperature 97.8 F (36.6 C), temperature source Oral, resp. rate 17, height 5' 8.5" (1.74 m), weight 167 lb 14.4 oz (76.2 kg), SpO2 100 %.    ECOG:  1    General appearance: Comfortable appearing without any discomfort Head: Normocephalic without any trauma Oropharynx: Mucous membranes are moist and pink without any thrush or ulcers. Eyes: Pupils are equal and round reactive to light. Lymph nodes: No cervical, supraclavicular, inguinal or axillary lymphadenopathy.   Heart:regular rate and rhythm.  S1 and S2 without leg edema. Lung: Clear without any rhonchi or wheezes.  No dullness to percussion. Abdomin: Soft, nontender, nondistended with good bowel sounds.  No hepatosplenomegaly. Musculoskeletal: No joint deformity or effusion.  Full range of motion noted. Neurological: No deficits noted on motor, sensory and deep tendon reflex exam. Skin: No petechial rash or dryness.  Appeared moist.        Lab Results: Lab Results  Component Value Date   WBC 5.0 03/23/2018   HGB 8.8 (L) 03/23/2018   HCT 29.6 (L) 03/23/2018   MCV 86.5 03/23/2018   PLT 154 03/23/2018     Chemistry      Component Value Date/Time   NA 142 03/23/2018 1313   K 4.9 03/23/2018 1313   CL 110 03/23/2018 1313   CO2 25 03/23/2018 1313   BUN 29 (H) 03/23/2018 1313   CREATININE 1.33 (H) 03/23/2018 1313      Component Value Date/Time   CALCIUM 8.8 (L) 03/23/2018 1313   ALKPHOS 84 03/23/2018 1313   AST 22 03/23/2018 1313   ALT 18 03/23/2018 1313   BILITOT 0.3 03/23/2018 1313      Results for [name], [name] (MRN [id number]) as of 12/18/2018 10:16  Ref. Range 12/22/2017 11:11 03/23/2018 13:13  Prostate Specific Ag, Serum Latest Ref Range: 0.0 - 4.0 ng/mL 0.3 <0.1   IMPRESSION: 1. Significant  reduction in the amount of left distal periureteral tumor compared to 10/18/2017, with reduced tumor along the prostate bed and in the left perirectal region. 2. New vertical band of sclerosis in the left iliac wing compared to 10/18/2017, interval stress fracture not excluded. 3. There is either a very large gallstone in the gallbladder fundus, or more likely wall calcification a portion of the gallbladder wall and fundus, versus less likely a rim calcified hepatic cyst along the gallbladder fundus. Porcelain gallbladder not excluded. Porcelain gallbladder can be associated with an increased risk of malignancy. Surveillance or cholecystectomy suggested. 4. Other imaging findings of potential clinical significance: Coronary atherosclerosis. Large hiatal hernia contains almost all of the stomach. Cholelithiasis. Sigmoid colon diverticulosis. Left  double-J ureteral stent satisfactorily position.  Impression and Plan:     85 year old man with:  1.    Prostate cancer diagnosed in the year 2000 and subsequently developed locally advanced disease in 2018.   The natural course of this disease was reviewed today with the patient and his daughter.  CT scan obtained on 12/14/2018 was personally reviewed and discussed as well.  He has a significant reduction in his tumor compared to previous imaging studies without any evidence of worsening metastasis.  His PSA remains undetectable as well.  These findings indicate that positive response to therapy but certainly you will always be at risk of developing castration-resistant disease and possible need for additional therapy.  At this time I recommended continuing androgen deprivation therapy alone and deferring additional treatment such as Nicki Reaper, systemic chemotherapy unless he develops progression of disease.   2.  Androgen deprivation: I recommended continuing that indefinitely.  He is scheduled to have that done in the near future with  his urologist.  3.  Pelvic pain: Related to his prostate cancer which is improved after radiation.  4.  Follow-up: In 6 months for repeat evaluation.  25  minutes was spent with the patient face-to-face today.  More than 50% of time was spent on updating his disease status, discussing imaging studies, future treatment options and answering questions regarding additional cancer treatment options.    Luis Button, MD 10/26/202010:20 AM

## 2018-12-18 NOTE — Telephone Encounter (Signed)
Scheduled appt per 10/26 los.  Lft a VM of the appt date and time.

## 2018-12-19 DIAGNOSIS — C61 Malignant neoplasm of prostate: Secondary | ICD-10-CM | POA: Diagnosis not present

## 2018-12-26 DIAGNOSIS — N390 Urinary tract infection, site not specified: Secondary | ICD-10-CM | POA: Diagnosis not present

## 2019-01-05 ENCOUNTER — Other Ambulatory Visit: Payer: Self-pay | Admitting: Family Medicine

## 2019-01-08 ENCOUNTER — Telehealth: Payer: Self-pay

## 2019-01-08 ENCOUNTER — Encounter: Payer: Self-pay | Admitting: Family Medicine

## 2019-01-08 ENCOUNTER — Other Ambulatory Visit: Payer: Self-pay

## 2019-01-08 ENCOUNTER — Telehealth (INDEPENDENT_AMBULATORY_CARE_PROVIDER_SITE_OTHER): Payer: Medicare HMO | Admitting: Family Medicine

## 2019-01-08 DIAGNOSIS — K5733 Diverticulitis of large intestine without perforation or abscess with bleeding: Secondary | ICD-10-CM | POA: Diagnosis not present

## 2019-01-08 DIAGNOSIS — D6489 Other specified anemias: Secondary | ICD-10-CM | POA: Diagnosis not present

## 2019-01-08 MED ORDER — CIPROFLOXACIN HCL 500 MG PO TABS: 500.0000 mg | ORAL_TABLET | Freq: Two times a day (BID) | ORAL | 0 refills | Status: DC

## 2019-01-08 MED ORDER — METRONIDAZOLE 500 MG PO TABS: 500.0000 mg | ORAL_TABLET | Freq: Three times a day (TID) | ORAL | 0 refills | Status: DC

## 2019-01-08 NOTE — Progress Notes (Signed)
Virtual Visit via Telephone Note  I connected with the patient on 01/08/19 at  4:15 PM EST by telephone and verified that I am speaking with the correct person using two identifiers.We attempted to connect virtually but we had technical difficulties with the audio and video.     I discussed the limitations, risks, security and privacy concerns of performing an evaluation and management service by telephone and the availability of in person appointments. I also discussed with the patient that there may be a patient responsible charge related to this service. The patient expressed understanding and agreed to proceed.  Location patient: home Location provider: work or home office Participants present for the call: patient, provider Patient did not have a visit in the prior 7 days to address this/these issue(s).   History of Present Illness: Here with his wife and daughter to describe several days of passing loose dark black stools. He saw Urology on 12-06-18 to have his ureteral stents replaced and this went well. Then about a week ago he developed a fever as high as 101.8 degrees and bilateral low back pain. His Urologist gave him a week of Cipro thinking he may have a UTI, but a culture of his urine sample came back negative. Over the past week he has felt somewhat better, and the back pain and fever resolved. However the loose black stools then appeared. He has no abdominal pain and no nausea or vomiting. Appetite is good. He has continued to take the Cipro. He has no known hx of diverticulitis, but his last colonoscopy in 2010 show extensive diverticulosis. He has also had known anemia for several years, and the last Hgb I can find from this last July was 9.7.    Observations/Objective: Patient sounds cheerful and well on the phone. I do not appreciate any SOB. Speech and thought processing are grossly intact. Patient reported vitals:  Assessment and Plan: This is likely a case of  diverticulitis and he has partially responded to the Cipro. He seems to have some diverticular bleeding now. We will send in another 10 days supply of Cipro and we will add Flagyl 500 mg TID to that. He will come by the lab tomorrow for a CBC. We will touch base with him again tomorrow.  Alysia Penna, MD   Follow Up Instructions:     [id number] 5-10 [phone] 11-20 [id number] 21-30 I did not refer this patient for an OV in the next 24 hours for this/these issue(s).  I discussed the assessment and treatment plan with the patient. The patient was provided an opportunity to ask questions and all were answered. The patient agreed with the plan and demonstrated an understanding of the instructions.   The patient was advised to call back or seek an in-person evaluation if the symptoms worsen or if the condition fails to improve as anticipated.  I provided 17 minutes of non-face-to-face time during this encounter.   Alysia Penna, MD

## 2019-01-08 NOTE — Telephone Encounter (Signed)
Please see message below.  Called and spoke to patient's daughter and informed her of information below.  Patient advised to go to the ED if patient develops abdominal pain or starts to vomit.  Patient's daughter verbalized understanding.

## 2019-01-08 NOTE — Telephone Encounter (Signed)
Copied from [place] [id number]. Topic: General - Inquiry >> Jan 04, 2019 10:39 AM Mathis Bud wrote: Reason for CRM:  Patient daughter Delrae Rend) called stating father has had back pain for the past week, his bowel movements have been black with a chemical smell to them.  He had a fever over the weekend, but it has got done this week.  Patient daughter Delrae Rend just wanted PCP to know, and is asking what to do further.   Call back [phone]

## 2019-01-08 NOTE — Telephone Encounter (Signed)
-----   Message from Wyatt Portela, MD sent at 01/08/2019  9:30 AM EST ----- Regarding: RE: Patient concern He needs to see his primary care physician.  She started developing abdominal pain or vomiting he needs to go to the emergency department.  This is not a cancer related issue. ----- Message ----- From: Teodoro Spray, RN Sent: 01/08/2019   9:15 AM EST To: Wyatt Portela, MD Subject: Patient concern                                Patient's daughter called office stating patient has been having black tarry stools since 01/02/19.  Daughter tried reaching out to PCP, but the office has yet to return her call. Patient denies increased pain, or increase in number of stools.   Please advise.

## 2019-01-08 NOTE — Telephone Encounter (Signed)
Spoke to pt daughter and she stated pt is having black stool. Pt stated that the stool looks sandy. Pt daughter Lars Mage stated pt eats a lot sugary snacks. Lars Mage stated that pt had a fever and pt was seen by Uro and denies infection. The fever stopped and that's when the black stools started. No med changes but pt has fallen 2-3 times this past week. Pt also c/o back pain that Lars Mage thinks may be realated to the falls.

## 2019-01-09 ENCOUNTER — Inpatient Hospital Stay (HOSPITAL_COMMUNITY)
Admission: EM | Admit: 2019-01-09 | Discharge: 2019-01-12 | DRG: 378 | Disposition: A | Discharge status: Home Health | Payer: Medicare HMO | Attending: Internal Medicine | Admitting: Internal Medicine

## 2019-01-09 ENCOUNTER — Other Ambulatory Visit (INDEPENDENT_AMBULATORY_CARE_PROVIDER_SITE_OTHER): Payer: Medicare HMO

## 2019-01-09 ENCOUNTER — Encounter (HOSPITAL_COMMUNITY): Payer: Self-pay

## 2019-01-09 ENCOUNTER — Other Ambulatory Visit: Payer: Self-pay

## 2019-01-09 ENCOUNTER — Observation Stay (HOSPITAL_COMMUNITY): Payer: Medicare HMO

## 2019-01-09 DIAGNOSIS — Z833 Family history of diabetes mellitus: Secondary | ICD-10-CM

## 2019-01-09 DIAGNOSIS — Z9079 Acquired absence of other genital organ(s): Secondary | ICD-10-CM

## 2019-01-09 DIAGNOSIS — R531 Weakness: Secondary | ICD-10-CM | POA: Diagnosis not present

## 2019-01-09 DIAGNOSIS — Z7982 Long term (current) use of aspirin: Secondary | ICD-10-CM

## 2019-01-09 DIAGNOSIS — Z20828 Contact with and (suspected) exposure to other viral communicable diseases: Secondary | ICD-10-CM | POA: Diagnosis present

## 2019-01-09 DIAGNOSIS — Z923 Personal history of irradiation: Secondary | ICD-10-CM

## 2019-01-09 DIAGNOSIS — K5733 Diverticulitis of large intestine without perforation or abscess with bleeding: Secondary | ICD-10-CM

## 2019-01-09 DIAGNOSIS — Z8249 Family history of ischemic heart disease and other diseases of the circulatory system: Secondary | ICD-10-CM

## 2019-01-09 DIAGNOSIS — Z801 Family history of malignant neoplasm of trachea, bronchus and lung: Secondary | ICD-10-CM

## 2019-01-09 DIAGNOSIS — S00531A Contusion of lip, initial encounter: Secondary | ICD-10-CM | POA: Diagnosis present

## 2019-01-09 DIAGNOSIS — Z951 Presence of aortocoronary bypass graft: Secondary | ICD-10-CM

## 2019-01-09 DIAGNOSIS — R296 Repeated falls: Secondary | ICD-10-CM | POA: Diagnosis not present

## 2019-01-09 DIAGNOSIS — E039 Hypothyroidism, unspecified: Secondary | ICD-10-CM | POA: Diagnosis present

## 2019-01-09 DIAGNOSIS — I480 Paroxysmal atrial fibrillation: Secondary | ICD-10-CM | POA: Diagnosis present

## 2019-01-09 DIAGNOSIS — I2510 Atherosclerotic heart disease of native coronary artery without angina pectoris: Secondary | ICD-10-CM | POA: Diagnosis present

## 2019-01-09 DIAGNOSIS — Z823 Family history of stroke: Secondary | ICD-10-CM

## 2019-01-09 DIAGNOSIS — Z8546 Personal history of malignant neoplasm of prostate: Secondary | ICD-10-CM

## 2019-01-09 DIAGNOSIS — Z79899 Other long term (current) drug therapy: Secondary | ICD-10-CM

## 2019-01-09 DIAGNOSIS — Z86010 Personal history of colonic polyps: Secondary | ICD-10-CM

## 2019-01-09 DIAGNOSIS — K921 Melena: Principal | ICD-10-CM

## 2019-01-09 DIAGNOSIS — I10 Essential (primary) hypertension: Secondary | ICD-10-CM | POA: Diagnosis present

## 2019-01-09 DIAGNOSIS — K449 Diaphragmatic hernia without obstruction or gangrene: Secondary | ICD-10-CM | POA: Diagnosis present

## 2019-01-09 DIAGNOSIS — D62 Acute posthemorrhagic anemia: Secondary | ICD-10-CM

## 2019-01-09 DIAGNOSIS — D649 Anemia, unspecified: Secondary | ICD-10-CM | POA: Diagnosis not present

## 2019-01-09 DIAGNOSIS — K922 Gastrointestinal hemorrhage, unspecified: Secondary | ICD-10-CM | POA: Diagnosis present

## 2019-01-09 DIAGNOSIS — Z8719 Personal history of other diseases of the digestive system: Secondary | ICD-10-CM

## 2019-01-09 DIAGNOSIS — Y92009 Unspecified place in unspecified non-institutional (private) residence as the place of occurrence of the external cause: Secondary | ICD-10-CM

## 2019-01-09 DIAGNOSIS — Z79890 Hormone replacement therapy (postmenopausal): Secondary | ICD-10-CM

## 2019-01-09 DIAGNOSIS — W19XXXA Unspecified fall, initial encounter: Secondary | ICD-10-CM

## 2019-01-09 DIAGNOSIS — Z800 Family history of malignant neoplasm of digestive organs: Secondary | ICD-10-CM

## 2019-01-09 LAB — COMPREHENSIVE METABOLIC PANEL
ALT: 26 U/L (ref 0–44)
AST: 30 U/L (ref 15–41)
Albumin: 3.4 g/dL — ABNORMAL LOW (ref 3.5–5.0)
Alkaline Phosphatase: 72 U/L (ref 38–126)
Anion gap: 6 (ref 5–15)
BUN: 27 mg/dL — ABNORMAL HIGH (ref 8–23)
CO2: 26 mmol/L (ref 22–32)
Calcium: 8.6 mg/dL — ABNORMAL LOW (ref 8.9–10.3)
Chloride: 109 mmol/L (ref 98–111)
Creatinine, Ser: 1.06 mg/dL (ref 0.61–1.24)
GFR calc Af Amer: >60 mL/min (ref >60)
GFR calc non Af Amer: >60 mL/min (ref >60)
Glucose, Bld: 102 mg/dL — ABNORMAL HIGH (ref 70–99)
Potassium: 4.2 mmol/L (ref 3.5–5.1)
Sodium: 141 mmol/L (ref 135–145)
Total Bilirubin: 0.4 mg/dL (ref 0.3–1.2)
Total Protein: 6.6 g/dL (ref 6.5–8.1)

## 2019-01-09 LAB — CBC WITH DIFFERENTIAL/PLATELET
Basophils Absolute: 0 10*3/uL (ref 0.0–0.1)
Basophils Relative: 0.7 % (ref 0.0–3.0)
Eosinophils Absolute: 0.1 10*3/uL (ref 0.0–0.7)
Eosinophils Relative: 1.8 % (ref 0.0–5.0)
HCT: 21.7 % — CL (ref 39.0–52.0)
Hemoglobin: 7 g/dL — CL (ref 13.0–17.0)
Lymphocytes Relative: 9.3 % — ABNORMAL LOW (ref 12.0–46.0)
Lymphs Abs: 0.5 10*3/uL — ABNORMAL LOW (ref 0.7–4.0)
MCHC: 32.1 g/dL (ref 30.0–36.0)
MCV: 87 fl (ref 78.0–100.0)
Monocytes Absolute: 0.3 10*3/uL (ref 0.1–1.0)
Monocytes Relative: 6.4 % (ref 3.0–12.0)
Neutro Abs: 4.2 10*3/uL (ref 1.4–7.7)
Neutrophils Relative %: 81.8 % — ABNORMAL HIGH (ref 43.0–77.0)
Platelets: 242 10*3/uL (ref 150.0–400.0)
RBC: 2.49 Mil/uL — ABNORMAL LOW (ref 4.22–5.81)
RDW: 17 % — ABNORMAL HIGH (ref 11.5–15.5)
WBC: 5.1 10*3/uL (ref 4.0–10.5)

## 2019-01-09 LAB — CBC
HCT: 25.3 % — ABNORMAL LOW (ref 39.0–52.0)
Hemoglobin: 7.4 g/dL — ABNORMAL LOW (ref 13.0–17.0)
MCH: 27.6 pg (ref 26.0–34.0)
MCHC: 29.2 g/dL — ABNORMAL LOW (ref 30.0–36.0)
MCV: 94.4 fL (ref 80.0–100.0)
Platelets: 245 10*3/uL (ref 150–400)
RBC: 2.68 MIL/uL — ABNORMAL LOW (ref 4.22–5.81)
RDW: 16.2 % — ABNORMAL HIGH (ref 11.5–15.5)
WBC: 5.3 10*3/uL (ref 4.0–10.5)
nRBC: 0 % (ref 0.0–0.2)

## 2019-01-09 LAB — PREPARE RBC (CROSSMATCH)

## 2019-01-09 LAB — HEMOGLOBIN AND HEMATOCRIT, BLOOD
HCT: 22.6 % — ABNORMAL LOW (ref 39.0–52.0)
Hemoglobin: 6.8 g/dL — CL (ref 13.0–17.0)

## 2019-01-09 LAB — POC OCCULT BLOOD, ED: Fecal Occult Bld: NEGATIVE

## 2019-01-09 MED ORDER — SODIUM CHLORIDE 0.9% IV SOLUTION: Freq: Once | INTRAVENOUS | Status: DC

## 2019-01-09 MED ORDER — SODIUM CHLORIDE 0.9% IV SOLUTION
Freq: Once | INTRAVENOUS | Status: AC
  Administered 2019-01-09: 23:00:00 via INTRAVENOUS

## 2019-01-09 MED ORDER — ACETAMINOPHEN 325 MG PO TABS
650.0000 mg | ORAL_TABLET | Freq: Four times a day (QID) | ORAL | Status: DC | PRN
  Administered 2019-01-10 – 2019-01-12 (×2): 650 mg via ORAL
  Filled 2019-01-09 (×2): qty 2

## 2019-01-09 MED ORDER — ACETAMINOPHEN 650 MG RE SUPP: 650.0000 mg | Freq: Four times a day (QID) | RECTAL | Status: DC | PRN

## 2019-01-09 MED ORDER — ATORVASTATIN CALCIUM 10 MG PO TABS
10.0000 mg | ORAL_TABLET | Freq: Every day | ORAL | Status: DC
  Administered 2019-01-10 – 2019-01-11 (×2): 10 mg via ORAL
  Filled 2019-01-09 (×2): qty 1

## 2019-01-09 MED ORDER — LEVOTHYROXINE SODIUM 50 MCG PO TABS
50.0000 ug | ORAL_TABLET | Freq: Every day | ORAL | Status: DC
  Administered 2019-01-10 – 2019-01-12 (×3): 50 ug via ORAL
  Filled 2019-01-09 (×3): qty 1

## 2019-01-09 NOTE — ED Triage Notes (Signed)
Patient was sent to the ED for Hgb-7.0. patient states he has had several loose black stools, but today he had a formed brown stool.

## 2019-01-09 NOTE — Progress Notes (Signed)
CRITICAL VALUE ALERT  Critical Value:  hgb 6.8  Date & Time Notied:  01/09/19  Provider Notified: Threasa Alpha  Orders Received/Actions taken: 1 unit PRBC already ordered. Waiting for it to be ready to begin transfusion.

## 2019-01-09 NOTE — ED Provider Notes (Signed)
[facility] DEPT Provider Note   CSN: [id number] Arrival date & time: 01/09/19  1528     History   Chief Complaint Chief Complaint  Patient presents with   Abnormal Lab   GI Bleeding    HPI [name]. is a 85 y.o. male.     The history is provided by the patient and medical records. No language interpreter was used.  Abnormal Lab  [name]. is a 85 y.o. male who presents to the Emergency Department complaining of abnormal lab. He was referred to the emergency department for an outpatient hemoglobin of 7.0 today. He states that is been feeling poorly recently with multiple falls, last fall was several days ago. He has been experiencing black stools and diarrhea for about 3 to 4 days. Today he had a normal formed bowel movements that was brown in color. He complains of generalized weakness, fatigue. He denies any headache, chest pain, abdominal pain, nausea, vomiting. He takes a baby aspirin. He lives at home with his wife. No known COVID 19 exposures. Symptoms are moderate and constant nature. Past Medical History:  Diagnosis Date   Complication of anesthesia    "serious cognisance problems since my OR in January" (03/13/2013   Coronary artery disease    Hx of echocardiogram    Echo 5/16:  Mild focal basal septal hypertrophy, EF 55%, mild AI, MV repair ok with trivial MR (mean 3 mmHg), mild LAE, mild RAE, PASP 35 mmHg   Hypertension    Irritable bowel syndrome    PAF (paroxysmal atrial fibrillation) ([place]) 05/03/2014   Personal history of colonic polyps    tubular adenoma   Pleural effusion, bilateral 03/12/2013   Small to moderate, L>R   Prostate cancer [facility]) 1991   sees Dr. Carlota Raspberry at [facility], observation only    S/P CABG x 2 02/28/2013   LIMA to LAD, SVG to OM1, EVH via right thigh   S/P Maze operation for atrial fibrillation 02/28/2013   Complete bilateral atrial lesion set using bipolar radiofrequency and  cryothermy ablation with clipping of LA appendage   S/P mitral valve repair, maze procedure, and CABG x2 02/28/2013   Complex valvuloplasty including triangular resection of posterior leaflet, 30 mm Sorin Memo 3D ring annuloplasty    Patient Active Problem List   Diagnosis Date Noted   Symptomatic anemia 01/09/2019   Anemia due to multiple mechanisms 01/08/2019   PVC's (premature ventricular contractions) 11/07/2017   Urinary retention 02/04/2017   PAF (paroxysmal atrial fibrillation), CHA2DS2VASc score 4 05/03/2014   Dizziness 05/03/2014   Encounter for therapeutic drug monitoring 05/07/2013   S/P CABG x 2 03/26/2013   S/P MVR (mitral valve repair) 03/26/2013   S/P Maze operation for atrial fibrillation 03/26/2013   Current use of long term anticoagulation 03/16/2013   Chronic diastolic CHF (congestive heart failure) ([place]) 03/12/2013   Pre-syncope 03/12/2013   Pleural effusion, bilateral 03/12/2013   Delirium 03/12/2013   CAD (coronary artery disease) 03/06/2013   S/P mitral valve repair, maze procedure, and CABG x2 02/29/12 02/28/2013   Unstable angina ([place]) 02/26/2013   Special screening for malignant neoplasms, colon 10/05/2010   Benign neoplasm of colon 10/05/2010   Diverticulosis of colon (without mention of hemorrhage) 10/05/2010   ADENOCARCINOMA, PROSTATE, GLEASON GRADE 3 08/13/2009   SEBACEOUS CYST 08/13/2009   Hypothyroidism 08/02/2007   GERD 08/02/2007   OSTEOPENIA 08/02/2007   UNS ADVRS EFF OTH RX MEDICINAL&BIOLOGICAL SBSTNC 08/02/2007   HLD (hyperlipidemia) 12/15/2006  PROSTATE SPECIFIC ANTIGEN, ELEVATED 12/15/2006   Essential hypertension 09/05/2006   ARTHRITIS 09/05/2006   COLONIC POLYPS, HX OF 09/05/2006    Past Surgical History:  Procedure Laterality Date   CARDIAC CATHETERIZATION  2015   CATARACT EXTRACTION, BILATERAL Bilateral 2014   per Dr. Gershon Crane    COLONOSCOPY  2011   per Dr. Sharlett Iles, benign polyps, no repeats  needed    CORONARY ARTERY BYPASS GRAFT N/A 02/28/2013   Procedure: CORONARY ARTERY BYPASS GRAFTING (CABG);  Surgeon: Rexene Alberts, MD;  Location: [facility];  Service: Open Heart Surgery;  Laterality: N/A;  CABG x 2,  using left internal mammary artery and right leg saphenous vein harvested endoscopically   HEMORRHOID SURGERY     INTRAOPERATIVE TRANSESOPHAGEAL ECHOCARDIOGRAM N/A 02/28/2013   Procedure: INTRAOPERATIVE TRANSESOPHAGEAL ECHOCARDIOGRAM;  Surgeon: Rexene Alberts, MD;  Location: [facility];  Service: Open Heart Surgery;  Laterality: N/A;   LEFT HEART CATHETERIZATION WITH CORONARY ANGIOGRAM N/A 02/26/2013   Procedure: LEFT HEART CATHETERIZATION WITH CORONARY ANGIOGRAM;  Surgeon: Leonie Man, MD;  Location: [facility] CATH LAB;  Service: Cardiovascular;  Laterality: N/A;   MAZE N/A 02/28/2013   Procedure: MAZE;  Surgeon: Rexene Alberts, MD;  Location: [facility];  Service: Open Heart Surgery;  Laterality: N/A;   MITRAL VALVE REPAIR N/A 02/28/2013   Procedure: MITRAL VALVE REPAIR (MVR);  Surgeon: Rexene Alberts, MD;  Location: [facility];  Service: Open Heart Surgery;  Laterality: N/A;   PROSTATE BIOPSY     TRANSURETHRAL RESECTION OF PROSTATE N/A 02/04/2017   Procedure: TRANSURETHRAL RESECTION OF THE PROSTATE (TURP);  Surgeon: Alexis Frock, MD;  Location[facility];  Service: Urology;  Laterality: N/A;        Home Medications    Prior to Admission medications   Medication Sig Start Date End Date Taking? Authorizing Provider  amoxicillin (AMOXIL) 500 MG capsule Take 2,000 mg by mouth as directed. Prior to dental appointment 07/30/14  Yes [provider]  aspirin EC 81 MG tablet Take 1 tablet (81 mg total) by mouth daily. 11/07/17  Yes Fay Records, MD  atorvastatin (LIPITOR) 10 MG tablet TAKE 1 TABLET BY MOUTH EVERY DAY AT 6:00 PM Patient taking differently: Take 10 mg by mouth daily at 6 PM.  01/30/18  Yes [name], MD  Cholecalciferol (VITAMIN D3 PO) Take 2,000 Units by mouth daily.   Yes  [provider]  ciprofloxacin (CIPRO) 500 MG tablet Take 1 tablet (500 mg total) by mouth 2 (two) times daily. 01/08/19  Yes [name], MD  diphenoxylate-atropine (LOMOTIL) 2.5-0.025 MG tablet TAKE 1 TABLET BY MOUTH 4 TIMES DAILY AS NEEDED FOR  DIARRHEA  OR  LOOSE  STOOLS  (AT  [facility]) Patient taking differently: Take 1 tablet by mouth 4 (four) times daily as needed for diarrhea or loose stools.  08/01/18  Yes Tyler Pita, MD  fish oil-omega-3 fatty acids 1000 MG capsule Take 1 g by mouth every morning.    Yes [provider]  Leuprolide Acetate, 6 Month, (LUPRON) 45 MG injection Inject 45 mg into the muscle every 3 (three) months.    Yes [provider]  levothyroxine (SYNTHROID, LEVOTHROID) 50 MCG tablet Take 1 tablet (50 mcg total) by mouth every morning. 01/30/18  Yes [name], MD  metoprolol tartrate (LOPRESSOR) 25 MG tablet TAKE ONE TABLET BY MOUTH TWICE A DAY Patient taking differently: Take 25 mg by mouth 2 (two) times daily.  01/05/19  Yes [name], MD  metroNIDAZOLE (FLAGYL) 500 MG tablet Take 1 tablet (500 mg total) by mouth 3 (three) times daily. 01/08/19  Yes [name], MD  vitamin C (ASCORBIC ACID) 500 MG tablet Take 500 mg by mouth every morning.    Yes [provider]  diclofenac sodium (VOLTAREN) 1 % GEL Apply 2 g topically 4 (four) times daily. Patient not taking: Reported on 01/09/2019 04/23/18   Delia Heady, PA-C  lidocaine (LIDODERM) 5 % Place 2 patches onto the skin daily. Remove & Discard patch within 12 hours or as directed by MD on shoulder and hip. Patient not taking: Reported on 01/09/2019 09/14/18   Gareth Morgan, MD  meclizine (ANTIVERT) 25 MG tablet Take 1 tablet (25 mg total) by mouth 3 (three) times daily as needed. Patient not taking: Reported on 01/09/2019 05/03/14   Glendell Docker, NP  nitroGLYCERIN (NITROSTAT) 0.4 MG SL tablet Place 1 tablet (0.4 mg total) under the tongue every 5 (five)  minutes as needed for chest pain. 03/17/15   Fay Records, MD  triamcinolone cream (KENALOG) 0.1 % Apply 1 application topically 2 (two) times daily. Patient not taking: Reported on 01/09/2019 05/02/18   [name], MD    Family History Family History  Problem Relation Age of Onset   Hypertension Mother    Sudden death Mother    Hypertension Father    Sudden death Father    Heart disease Son    Diabetes Other    Hypertension Other    Sudden death Other    Heart disease Other    Stroke Other    Lung cancer Brother    Stomach cancer Brother    Cancer Daughter    Cancer Brother    Diabetes Sister     Social History Social History   Tobacco Use   Smoking status: Never Smoker   Smokeless tobacco: Never Used  Substance Use Topics   Alcohol use: No    Alcohol/week: 0.0 standard drinks   Drug use: No     Allergies   Other   Review of Systems Review of Systems   Physical Exam Updated Vital Signs BP (!) 132/54    Pulse 72    Temp 98 F (36.7 C) (Oral)    Resp 16    Ht 5\' 9"  (1.753 m)    Wt 76.2 kg    SpO2 94%    BMI 24.81 kg/m   Physical Exam Vitals signs and nursing note reviewed.  Constitutional:      Appearance: He is well-developed.  HENT:     Head: Normocephalic.     Comments: Ecchymosis to right lower lip Cardiovascular:     Rate and Rhythm: Normal rate and regular rhythm.     Heart sounds: No murmur.  Pulmonary:     Effort: Pulmonary effort is normal. No respiratory distress.     Breath sounds: Normal breath sounds.  Abdominal:     Palpations: Abdomen is soft.     Tenderness: There is no abdominal tenderness. There is no guarding or rebound.  Genitourinary:    Comments: Brown stool in rectal vault, no gross blood.  Musculoskeletal:        General: No swelling or tenderness.  Skin:    General: Skin is warm and dry.  Neurological:     Mental Status: He is alert and oriented to person, place, and time.  Psychiatric:         Behavior: Behavior normal.  ED Treatments / Results  Labs (all labs ordered are listed, but only abnormal results are displayed) Labs Reviewed  COMPREHENSIVE METABOLIC PANEL - Abnormal; Notable for the following components:      Result Value   Glucose, Bld 102 (*)    BUN 27 (*)    Calcium 8.6 (*)    Albumin 3.4 (*)    All other components within normal limits  CBC - Abnormal; Notable for the following components:   RBC 2.68 (*)    Hemoglobin 7.4 (*)    HCT 25.3 (*)    MCHC 29.2 (*)    RDW 16.2 (*)    All other components within normal limits  SARS CORONAVIRUS 2 (TAT 6-24 HRS)  POC OCCULT BLOOD, ED  TYPE AND SCREEN  PREPARE RBC (CROSSMATCH)    EKG None  Radiology No results found.  Procedures Procedures (including critical care time)  Medications Ordered in ED Medications  0.9 %  sodium chloride infusion (Manually program via Guardrails IV Fluids) (has no administration in time range)  0.9 %  sodium chloride infusion (Manually program via Guardrails IV Fluids) (has no administration in time range)     Initial Impression / Assessment and Plan / ED Course  I have reviewed the triage vital signs and the nursing notes.  Pertinent labs & imaging results that were available during my care of the patient were reviewed by me and considered in my medical decision making (see chart for details).        Pt here for evaluation of abnormal lab (hgb 7).  He does report black stool at home over last several days - now resolve.d  No current bleeding on exam.  Plan to transfuse for symptomatic anemia - possibly due to transient GI bleed.  Pt updated of findings of studies and recommendation for admission and he is in agreement with treatment plan.  Hospitalist consulted for admission.    Final Clinical Impressions(s) / ED Diagnoses   Final diagnoses:  Symptomatic anemia    ED Discharge Orders    None       Quintella Reichert, MD 01/09/19 1931

## 2019-01-09 NOTE — ED Notes (Signed)
ED TO INPATIENT HANDOFF REPORT  ED Nurse Name and Phone #: Gara Kroner, RN  S Name/Age/Gender [name]. 85 y.o. male Room/Bed: WA06/WA06  Code Status   Code Status: Full Code  Home/SNF/Other [facility] [place] Patient oriented to: self, place, time and situation Is this baseline? Yes   Triage Complete: Triage complete  Chief Complaint HGB  Triage Note Patient was sent to the ED for Hgb-7.0. patient states he has had several loose black stools, but today he had a formed brown stool.   Allergies Allergies  Allergen Reactions  . Other Other (See Comments)    ANESTHESIA.... 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 ANESTHESIA.... 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 Occurred post op CABG 2015, required readmission and rehab    Level of Care/Admitting Diagnosis ED Disposition    ED Disposition Condition [place]: [place] [100102]  Level of Care: Telemetry [5]  Admit to tele based on following criteria: Other see comments  Comments: Monitor hemodynamics  Covid Evaluation: Asymptomatic Screening Protocol (No Symptoms)  Diagnosis: Symptomatic anemia [id number]  Admitting Physician: Shela Leff [id number]  Attending Physician: Shela Leff [id number]  PT Class (Do Not Modify): Observation [104]  PT Acc Code (Do Not Modify): Observation [10022]       B Medical/Surgery History Past Medical History:  Diagnosis Date  . Complication of anesthesia    "serious cognisance problems since my OR in January" (03/13/2013  . Coronary artery disease   . Hx of echocardiogram    Echo 5/16:  Mild focal basal septal hypertrophy, EF 55%, mild AI, MV repair ok with trivial MR (mean 3 mmHg), mild LAE, mild RAE, PASP 35 mmHg  . Hypertension   . Irritable bowel syndrome   . PAF (paroxysmal atrial fibrillation) ([place]) 05/03/2014  . Personal history of colonic polyps    tubular adenoma  .  Pleural effusion, bilateral 03/12/2013   Small to moderate, L>R  . Prostate cancer [facility]) 1991   se[facility]. Carlota Raspberry at [facility], observation only   . S/P CABG x 2 02/28/2013   LIMA to LAD, SVG to OM1, EVH via right thigh  . S/P Maze operation for atrial fibrillation 02/28/2013   Complete bilateral atrial lesion set using bipolar radiofrequency and cryothermy ablation with clipping of LA appendage  . S/P mitral valve repair, maze procedure, and CABG x2 02/28/2013   Complex valvuloplasty including triangular resection of posterior leaflet, 30 mm Sorin Memo 3D ring annuloplasty   Past Surgical History:  Procedure Laterality Date  . CARDIAC CATHETERIZATION  2015  . CATARACT EXTRACTION, BILATERAL Bilateral 2014   per Dr. Gershon Crane   . COLONOSCOPY  2011   per Dr. Sharlett Iles, benign polyps, no repeats needed   . CORONARY ARTERY BYPASS GRAFT N/A 02/28/2013   Procedure: CORONARY ARTERY BYPASS GRAFTING (CABG);  Surgeon: Rexene Alberts, MD;  Location: [facility];  Service: Open Heart Surgery;  Laterality: N/A;  CABG x 2,  using left internal mammary artery and right leg saphenous vein harvested endoscopically  . HEMORRHOID SURGERY    . INTRAOPERATIVE TRANSESOPHAGEAL ECHOCARDIOGRAM N/A 02/28/2013   Procedure: INTRAOPERATIVE TRANSESOPHAGEAL ECHOCARDIOGRAM;  Surgeon: Rexene Alberts, MD;  Location: [facility];  Service: Open Heart Surgery;  Laterality: N/A;  . LEFT HEART CATHETERIZATION WITH CORONARY ANGIOGRAM N/A 02/26/2013   Procedure: LEFT HEART CATHETERIZATION WITH CORONARY ANGIOGRAM;  Surgeon: Leonie Man, MD;  Location: [facility] CATH LAB;  Service: Cardiovascular;  Laterality: N/A;  . MAZE N/A 02/28/2013   Procedure: MAZE;  Surgeon: Rexene Alberts, MD;  Location: [facility];  Service: Open Heart Surgery;  Laterality: N/A;  . MITRAL VALVE REPAIR N/A 02/28/2013   Procedure: MITRAL VALVE REPAIR (MVR);  Surgeon: Rexene Alberts, MD;  Location: [place];  Service: Open Heart Surgery;  Laterality: N/A;  . PROSTATE BIOPSY    . TRANSURETHRAL  RESECTION OF PROSTATE N/A 02/04/2017   Procedure: TRANSURETHRAL RESECTION OF THE PROSTATE (TURP);  Surgeon: Alexis Frock, MD;  [facility];  Service: Urology;  Laterality: N/A;     A IV Location/Drains/Wounds Patient Lines/Drains/Airways Status   Active Line/Drains/Airways    Name:   Placement date:   Placement time:   Site:   Days:   Peripheral IV 01/09/19 Right Antecubital   01/09/19    1715    Antecubital   less than 1   Peripheral IV 01/09/19 Left;Upper Arm   01/09/19    1927    Arm   less than 1   Urethral Catheter Dr Tresa Moore Latex 22 Fr.   02/04/17    1150    Latex   704   Incision (Closed) 02/04/17 Penis   02/04/17    1202     704          Intake/Output Last 24 hours No intake or output data in the 24 hours ending 01/09/19 2006  Labs/Imaging Results for orders placed or performed during the hospital encounter of 01/09/19 (from the past 48 hour(s))  Comprehensive metabolic panel     Status: Abnormal   Collection Time: 01/09/19  5:13 PM  Result Value Ref Range   Sodium 141 135 - 145 mmol/L   Potassium 4.2 3.5 - 5.1 mmol/L   Chloride 109 98 - 111 mmol/L   CO2 26 22 - 32 mmol/L   Glucose, Bld 102 (H) 70 - 99 mg/dL   BUN 27 (H) 8 - 23 mg/dL   Creatinine, Ser 1.06 0.61 - 1.24 mg/dL   Calcium 8.6 (L) 8.9 - 10.3 mg/dL   Total Protein 6.6 6.5 - 8.1 g/dL   Albumin 3.4 (L) 3.5 - 5.0 g/dL   AST 30 15 - 41 U/L   ALT 26 0 - 44 U/L   Alkaline Phosphatase 72 38 - 126 U/L   Total Bilirubin 0.4 0.3 - 1.2 mg/dL   GFR calc non Af Amer >60 >60 mL/min   GFR calc Af Amer >60 >60 mL/min   Anion gap 6 5 - 15    Comment: Performed at [facility], [place] [address], [place], [place] [address]  CBC     Status: Abnormal   Collection Time: 01/09/19  5:13 PM  Result Value Ref Range   WBC 5.3 4.0 - 10.5 K/uL   RBC 2.68 (L) 4.22 - 5.81 MIL/uL   Hemoglobin 7.4 (L) 13.0 - 17.0 g/dL   HCT 25.3 (L) 39.0 - 52.0 %   MCV 94.4 80.0 - 100.0 fL   MCH 27.6 26.0 - 34.0 pg   MCHC  29.2 (L) 30.0 - 36.0 g/dL   RDW 16.2 (H) 11.5 - 15.5 %   Platelets 245 150 - 400 K/uL   nRBC 0.0 0.0 - 0.2 %    Comment: Performed at [facility], [place] [address], [place],  [address]  POC occult blood, ED     Status: None   Collection Time: 01/09/19  5:52 PM  Result Value Ref Range  Fecal Occult Bld NEGATIVE NEGATIVE   No results found.  Pending Labs Unresulted Labs (From admission, onward)    Start     Ordered   01/09/19 1936  Hemoglobin and hematocrit, blood  Once,   STAT    Comments: Posttransfusion.    01/09/19 1935   01/09/19 1902  Prepare RBC  (Emergency Blood Administration - Red Blood Cells)  Once,   R    Question Answer Comment  # of Units 1 unit   Transfusion Indications Emergent/ Trauma   Transfusion Indications Symptomatic Anemia      01/09/19 1901   01/09/19 1901  SARS CORONAVIRUS 2 (TAT 6-24 HRS) Nasopharyngeal Nasopharyngeal Swab  (Asymptomatic/Tier 2 Patients Labs)  Once,   STAT    Question Answer Comment  Is this test for diagnosis or screening Screening   Symptomatic for COVID-19 as defined by CDC No   Hospitalized for COVID-19 No   Admitted to ICU for COVID-19 No   Previously tested for COVID-19 No   Resident in a congregate (group) care setting No   Employed in healthcare setting No      01/09/19 1901   01/09/19 1603  Type and screen [place] - STAT,   STAT    Comments: [place]    01/09/19 1602          Vitals/Pain Today's Vitals   01/09/19 1730 01/09/19 1800 01/09/19 1830 01/09/19 1957  BP: 120/72 113/80 (!) 132/54 (!) 149/69  Pulse: 78 78 72 83  Resp:  16 16 16   Temp:    98 F (36.7 C)  TempSrc:    Oral  SpO2: 100% 100% 94% 100%  Weight:      Height:      PainSc:    0-No pain    Isolation Precautions No active isolations  Medications Medications  0.9 %  sodium chloride infusion (Manually program via Guardrails IV Fluids) (has no administration in time  range)  atorvastatin (LIPITOR) tablet 10 mg (has no administration in time range)  levothyroxine (SYNTHROID) tablet 50 mcg (has no administration in time range)  acetaminophen (TYLENOL) tablet 650 mg (has no administration in time range)    Or  acetaminophen (TYLENOL) suppository 650 mg (has no administration in time range)    Mobility walks with device High fall risk   Focused Assessments    R Recommendations: See Admitting Provider Note  Report given to: Ander Purpura, RN  Additional Notes: N/A

## 2019-01-09 NOTE — ED Notes (Signed)
Pt to CT

## 2019-01-09 NOTE — H&P (Signed)
History and Physical    [name]. [id number] DOB: [birth year] DOA: 01/09/2019  PCP: [name], MD Patient coming from: Home  Chief Complaint: Anemia, melena  HPI: [name]. is a 85 y.o. male with medical history significant of colon polyps, diverticulosis, IBS, CAD status post CABG x2 status post CABG x2, history of mitral valve repair, hypertension, PAF, history of prostate cancer status post transurethral resection of prostate presenting to the ED for evaluation of anemia.  Patient had outpatient lab done today which revealed hemoglobin 7.0. Patient states he was recently having back pain and was seen by his urologist. States his urologist thought he was having a kidney infection and treated him with Cipro. His urine culture then came back and did not show signs of an infection. He has been having absolutely black-colored diarrhea for several days. He was seen by his PCP yesterday and was prescribed more antibiotics. States today finally he had brown-colored, well formed stool. He is not having any abdominal pain. He takes a baby aspirin daily. He has been feeling very weak and tired. A few days ago he was using his walker to go to his bathroom at home and fell. He thinks he may have passed out. Sometimes he feels lightheaded/dizzy. Denies chest pain or shortness of breath.  Of note, patient was seen by his PCP in the yesterday 11/16 for complaints of dark black stools.  He was diagnosed with diverticular bleeding and prescribed a 10-day course of Cipro and Flagyl.  ED Course: Hemodynamically stable.  Repeat CBC done in the ED showing hemoglobin 7.4.  Hemoglobin was 8.8 in January 2020.  FOBT negative.  1 unit PRBCs ordered for symptomatic anemia.  Review of Systems:  All systems reviewed and apart from history of presenting illness, are negative.  Past Medical History:  Diagnosis Date   Complication of anesthesia    "serious cognisance problems since my OR in  January" (03/13/2013   Coronary artery disease    Hx of echocardiogram    Echo 5/16:  Mild focal basal septal hypertrophy, EF 55%, mild AI, MV repair ok with trivial MR (mean 3 mmHg), mild LAE, mild RAE, PASP 35 mmHg   Hypertension    Irritable bowel syndrome    PAF (paroxysmal atrial fibrillation) ([place]) 05/03/2014   Personal history of colonic polyps    tubular adenoma   Pleural effusion, bilateral 03/12/2013   Small to moderate, L>R   Prostate cancer [facility]) 1991   sees Dr. Carlota Raspberry at [facility], observation only    S/P CABG x 2 02/28/2013   LIMA to LAD, SVG to OM1, EVH via right thigh   S/P Maze operation for atrial fibrillation 02/28/2013   Complete bilateral atrial lesion set using bipolar radiofrequency and cryothermy ablation with clipping of LA appendage   S/P mitral valve repair, maze procedure, and CABG x2 02/28/2013   Complex valvuloplasty including triangular resection of posterior leaflet, 30 mm Sorin Memo 3D ring annuloplasty    Past Surgical History:  Procedure Laterality Date   CARDIAC CATHETERIZATION  2015   CATARACT EXTRACTION, BILATERAL Bilateral 2014   per Dr. Gershon Crane    COLONOSCOPY  2011   per Dr. Sharlett Iles, benign polyps, no repeats needed    CORONARY ARTERY BYPASS GRAFT N/A 02/28/2013   Procedure: CORONARY ARTERY BYPASS GRAFTING (CABG);  Surgeon: Rexene Alberts, MD;  Location: [place];  Service: Open Heart Surgery;  Laterality: N/A;  CABG x 2,  using left internal mammary artery  and right leg saphenous vein harvested endoscopically   HEMORRHOID SURGERY     INTRAOPERATIVE TRANSESOPHAGEAL ECHOCARDIOGRAM N/A 02/28/2013   Procedure: INTRAOPERATIVE TRANSESOPHAGEAL ECHOCARDIOGRAM;  Surgeon: Rexene Alberts, MD;  Location: [place];  Service: Open Heart Surgery;  Laterality: N/A;   LEFT HEART CATHETERIZATION WITH CORONARY ANGIOGRAM N/A 02/26/2013   Procedure: LEFT HEART CATHETERIZATION WITH CORONARY ANGIOGRAM;  Surgeon: Leonie Man, MD;  Location: [facility] CATH LAB;   Service: Cardiovascular;  Laterality: N/A;   MAZE N/A 02/28/2013   Procedure: MAZE;  Surgeon: Rexene Alberts, MD;  Location: [place];  Service: Open Heart Surgery;  Laterality: N/A;   MITRAL VALVE REPAIR N/A 02/28/2013   Procedure: MITRAL VALVE REPAIR (MVR);  Surgeon: Rexene Alberts, MD;  Location: [place];  Service: Open Heart Surgery;  Laterality: N/A;   PROSTATE BIOPSY     TRANSURETHRAL RESECTION OF PROSTATE N/A 02/04/2017   Procedure: TRANSURETHRAL RESECTION OF THE PROSTATE (TURP);  Surgeon: Alexis Frock, MD;  Location: [facility];  Service: Urology;  Laterality: N/A;     reports that he has never smoked. He has never used smokeless tobacco. He reports that he does not drink alcohol or use drugs.  Allergies  Allergen Reactions   Other Other (See Comments)    ANESTHESIA.... 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 ANESTHESIA.... 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 Occurred post op CABG 2015, required readmission and rehab    Family History  Problem Relation Age of Onset   Hypertension Mother    Sudden death Mother    Hypertension Father    Sudden death Father    Heart disease Son    Diabetes Other    Hypertension Other    Sudden death Other    Heart disease Other    Stroke Other    Lung cancer Brother    Stomach cancer Brother    Cancer Daughter    Cancer Brother    Diabetes Sister     Prior to Admission medications   Medication Sig Start Date End Date Taking? Authorizing Provider  amoxicillin (AMOXIL) 500 MG capsule Take 2,000 mg by mouth as directed. Prior to dental appointment 07/30/14  Yes [provider]  aspirin EC 81 MG tablet Take 1 tablet (81 mg total) by mouth daily. 11/07/17  Yes Fay Records, MD  atorvastatin (LIPITOR) 10 MG tablet TAKE 1 TABLET BY MOUTH EVERY DAY AT 6:00 PM Patient taking differently: Take 10 mg by mouth daily at 6 PM.  01/30/18  Yes [name], MD  Cholecalciferol (VITAMIN D3 PO)  Take 2,000 Units by mouth daily.   Yes [provider]  ciprofloxacin (CIPRO) 500 MG tablet Take 1 tablet (500 mg total) by mouth 2 (two) times daily. 01/08/19  Yes [name], MD  diphenoxylate-atropine (LOMOTIL) 2.5-0.025 MG tablet TAKE 1 TABLET BY MOUTH 4 TIMES DAILY AS NEEDED FOR  DIARRHEA  OR  LOOSE  STOOLS  (AT  [place]) Patient taking differently: Take 1 tablet by mouth 4 (four) times daily as needed for diarrhea or loose stools.  08/01/18  Yes Tyler Pita, MD  fish oil-omega-3 fatty acids 1000 MG capsule Take 1 g by mouth every morning.    Yes [provider]  Leuprolide Acetate, 6 Month, (LUPRON) 45 MG injection Inject 45 mg into the muscle every 3 (three) months.    Yes [provider]  levothyroxine (SYNTHROID, LEVOTHROID) 50 MCG tablet Take 1 tablet (  50 mcg total) by mouth every morning. 01/30/18  Yes [name], MD  metoprolol tartrate (LOPRESSOR) 25 MG tablet TAKE ONE TABLET BY MOUTH TWICE A DAY Patient taking differently: Take 25 mg by mouth 2 (two) times daily.  01/05/19  Yes [name], MD  metroNIDAZOLE (FLAGYL) 500 MG tablet Take 1 tablet (500 mg total) by mouth 3 (three) times daily. 01/08/19  Yes [name], MD  vitamin C (ASCORBIC ACID) 500 MG tablet Take 500 mg by mouth every morning.    Yes [provider]  diclofenac sodium (VOLTAREN) 1 % GEL Apply 2 g topically 4 (four) times daily. Patient not taking: Reported on 01/09/2019 04/23/18   Delia Heady, PA-C  lidocaine (LIDODERM) 5 % Place 2 patches onto the skin daily. Remove & Discard patch within 12 hours or as directed by MD on shoulder and hip. Patient not taking: Reported on 01/09/2019 09/14/18   Gareth Morgan, MD  meclizine (ANTIVERT) 25 MG tablet Take 1 tablet (25 mg total) by mouth 3 (three) times daily as needed. Patient not taking: Reported on 01/09/2019 05/03/14   Glendell Docker, NP  nitroGLYCERIN (NITROSTAT) 0.4 MG SL tablet Place 1 tablet (0.4 mg  total) under the tongue every 5 (five) minutes as needed for chest pain. 03/17/15   Fay Records, MD  triamcinolone cream (KENALOG) 0.1 % Apply 1 application topically 2 (two) times daily. Patient not taking: Reported on 01/09/2019 05/02/18   [name], MD    Physical Exam: Vitals:   01/09/19 1730 01/09/19 1800 01/09/19 1830 01/09/19 1957  BP: 120/72 113/80 (!) 132/54 (!) 149/69  Pulse: 78 78 72 83  Resp:  16 16 16   Temp:    98 F (36.7 C)  TempSrc:    Oral  SpO2: 100% 100% 94% 100%  Weight:      Height:        Physical Exam  Constitutional: He is oriented to person, place, and time. He appears well-developed and well-nourished. No distress.  HENT:  Head: Normocephalic.  Eyes: Right eye exhibits no discharge. Left eye exhibits no discharge.  Neck: Neck supple.  Cardiovascular: Normal rate, regular rhythm and intact distal pulses.  Pulmonary/Chest: Effort normal and breath sounds normal. No respiratory distress. He has no wheezes. He has no rales.  Abdominal: Soft. Bowel sounds are normal. He exhibits no distension. There is no abdominal tenderness. There is no guarding.  Musculoskeletal:        General: No edema.  Neurological: He is alert and oriented to person, place, and time.  Skin: Skin is warm and dry. He is not diaphoretic.     Labs on Admission: I have personally reviewed following labs and imaging studies  CBC: Recent Labs  Lab 01/09/19 1113 01/09/19 1713  WBC 5.1 5.3  NEUTROABS 4.2  --   HGB 7.0 Repeated and verified X2.* 7.4*  HCT 21.7 Repeated and verified X2.* 25.3*  MCV 87.0 94.4  PLT 242.0 99991111   Basic Metabolic Panel: Recent Labs  Lab 01/09/19 1713  NA 141  K 4.2  CL 109  CO2 26  GLUCOSE 102*  BUN 27*  CREATININE 1.06  CALCIUM 8.6*   GFR: Estimated Creatinine Clearance: 47.2 mL/min (by C-G formula based on SCr of 1.06 mg/dL). Liver Function Tests: Recent Labs  Lab 01/09/19 1713  AST 30  ALT 26  ALKPHOS 72  BILITOT 0.4  PROT  6.6  ALBUMIN 3.4*   No results for input(s): LIPASE, AMYLASE in  the last 168 hours. No results for input(s): AMMONIA in the last 168 hours. Coagulation Profile: No results for input(s): INR, PROTIME in the last 168 hours. Cardiac Enzymes: No results for input(s): CKTOTAL, CKMB, CKMBINDEX, TROPONINI in the last 168 hours. BNP (last 3 results) No results for input(s): PROBNP in the last 8760 hours. HbA1C: No results for input(s): HGBA1C in the last 72 hours. CBG: No results for input(s): GLUCAP in the last 168 hours. Lipid Profile: No results for input(s): CHOL, HDL, LDLCALC, TRIG, CHOLHDL, LDLDIRECT in the last 72 hours. Thyroid Function Tests: No results for input(s): TSH, T4TOTAL, FREET4, T3FREE, THYROIDAB in the last 72 hours. Anemia Panel: No results for input(s): VITAMINB12, FOLATE, FERRITIN, TIBC, IRON, RETICCTPCT in the last 72 hours. Urine analysis:    Component Value Date/Time   COLORURINE YELLOW 01/20/2016 [place] 01/20/2016 1556   LABSPEC 1.025 01/20/2016 1556   PHURINE 5.5 01/20/2016 1556   GLUCOSEU NEGATIVE 01/20/2016 1556   HGBUR NEGATIVE 01/20/2016 1556   HGBUR negative 08/02/2007 0849   BILIRUBINUR 1+  1mg /dL 09/20/2017 1203   KETONESUR NEGATIVE 01/20/2016 1556   PROTEINUR Positive (A) 09/20/2017 1203   PROTEINUR NEGATIVE 05/03/2014 1020   UROBILINOGEN 1.0 09/20/2017 1203   UROBILINOGEN 0.2 01/20/2016 1556   NITRITE positive 09/20/2017 1203   NITRITE NEGATIVE 01/20/2016 1556   LEUKOCYTESUR Large (3+) (A) 09/20/2017 1203    Radiological Exams on Admission: No results found.  Assessment/Plan Principal Problem:   Symptomatic anemia Active Problems:   Hypothyroidism   Essential hypertension   CAD (coronary artery disease)   Fall at home, initial encounter   Symptomatic acute blood loss anemia, concern for upper GI bleed Suspect acute blood loss anemia is related to transient upper GI bleed/melena.  Patient takes aspirin 81 mg daily.   No prior EGD results in the chart.  Last colonoscopy done in August 2012 revealed 2 polyps in the ascending colon (tubular adenomas) which were excised.  Also revealed mild diverticulosis in the descending to sigmoid colon.  Patient is hemodynamically stable.  Hemoglobin 7.4, was 8.8 in January 2020.  FOBT negative. Patient states he had brown-colored, well formed stool today. -Type and screen -1 unit PRBCs -Posttransfusion H&H -Consult GI in a.m. -Keep n.p.o. after midnight -Hold home aspirin -Order stool studies if patient starts having diarrhea again  Recent fall/ possible syncope Likely related to symptomatic anemia/ possible orthostasis. -Head CT to rule out bleed given patient's advanced age -Check orthostatics  CAD status post CABG -Hold home aspirin at this time given concern for GI bleed.  Hypertension -Currently normotensive. Hold home antihypertensives at this time.  Hypothyroidism -Continue Synthroid  DVT prophylaxis: SCDs Code Status: Patient wishes to be full code. Family Communication: None available at this time. Disposition Plan: Anticipate discharge in 1 to 2 days. Consults called: None Admission status: It is my clinical opinion that referral for OBSERVATION is reasonable and necessary in this patient based on the above information provided. The aforementioned taken together are felt to place the patient at high risk for further clinical deterioration. However it is anticipated that the patient may be medically stable for discharge from the hospital within 24 to 48 hours.  The medical decision making on this patient was of high complexity and the patient is at high risk for clinical deterioration, therefore this is a level 3 visit.  Shela Leff MD [facility] Pager [phone]  If 7PM-7AM, please contact night-coverage [email or web address] Password [email or web address]  01/09/2019, 8:06 PM

## 2019-01-09 NOTE — ED Notes (Signed)
Pt placed on cardiac monitor 

## 2019-01-10 ENCOUNTER — Encounter (HOSPITAL_COMMUNITY): Payer: Self-pay | Admitting: Internal Medicine

## 2019-01-10 DIAGNOSIS — Z86010 Personal history of colonic polyps: Secondary | ICD-10-CM | POA: Diagnosis not present

## 2019-01-10 DIAGNOSIS — I2510 Atherosclerotic heart disease of native coronary artery without angina pectoris: Secondary | ICD-10-CM | POA: Diagnosis not present

## 2019-01-10 DIAGNOSIS — Z9079 Acquired absence of other genital organ(s): Secondary | ICD-10-CM | POA: Diagnosis not present

## 2019-01-10 DIAGNOSIS — Y92009 Unspecified place in unspecified non-institutional (private) residence as the place of occurrence of the external cause: Secondary | ICD-10-CM | POA: Diagnosis not present

## 2019-01-10 DIAGNOSIS — K921 Melena: Secondary | ICD-10-CM

## 2019-01-10 DIAGNOSIS — Z20828 Contact with and (suspected) exposure to other viral communicable diseases: Secondary | ICD-10-CM | POA: Diagnosis not present

## 2019-01-10 DIAGNOSIS — I10 Essential (primary) hypertension: Secondary | ICD-10-CM | POA: Diagnosis not present

## 2019-01-10 DIAGNOSIS — Z79899 Other long term (current) drug therapy: Secondary | ICD-10-CM | POA: Diagnosis not present

## 2019-01-10 DIAGNOSIS — Z7982 Long term (current) use of aspirin: Secondary | ICD-10-CM | POA: Diagnosis not present

## 2019-01-10 DIAGNOSIS — Z8546 Personal history of malignant neoplasm of prostate: Secondary | ICD-10-CM | POA: Diagnosis not present

## 2019-01-10 DIAGNOSIS — Z923 Personal history of irradiation: Secondary | ICD-10-CM | POA: Diagnosis not present

## 2019-01-10 DIAGNOSIS — Z8249 Family history of ischemic heart disease and other diseases of the circulatory system: Secondary | ICD-10-CM | POA: Diagnosis not present

## 2019-01-10 DIAGNOSIS — Z833 Family history of diabetes mellitus: Secondary | ICD-10-CM | POA: Diagnosis not present

## 2019-01-10 DIAGNOSIS — R296 Repeated falls: Secondary | ICD-10-CM | POA: Diagnosis not present

## 2019-01-10 DIAGNOSIS — S00531A Contusion of lip, initial encounter: Secondary | ICD-10-CM | POA: Diagnosis not present

## 2019-01-10 DIAGNOSIS — W19XXXA Unspecified fall, initial encounter: Secondary | ICD-10-CM | POA: Diagnosis not present

## 2019-01-10 DIAGNOSIS — Z79890 Hormone replacement therapy (postmenopausal): Secondary | ICD-10-CM | POA: Diagnosis not present

## 2019-01-10 DIAGNOSIS — Z801 Family history of malignant neoplasm of trachea, bronchus and lung: Secondary | ICD-10-CM | POA: Diagnosis not present

## 2019-01-10 DIAGNOSIS — D649 Anemia, unspecified: Secondary | ICD-10-CM | POA: Diagnosis not present

## 2019-01-10 DIAGNOSIS — Z8719 Personal history of other diseases of the digestive system: Secondary | ICD-10-CM | POA: Diagnosis not present

## 2019-01-10 DIAGNOSIS — K922 Gastrointestinal hemorrhage, unspecified: Secondary | ICD-10-CM | POA: Diagnosis present

## 2019-01-10 DIAGNOSIS — D62 Acute posthemorrhagic anemia: Secondary | ICD-10-CM | POA: Diagnosis not present

## 2019-01-10 DIAGNOSIS — K449 Diaphragmatic hernia without obstruction or gangrene: Secondary | ICD-10-CM | POA: Diagnosis not present

## 2019-01-10 DIAGNOSIS — Z951 Presence of aortocoronary bypass graft: Secondary | ICD-10-CM | POA: Diagnosis not present

## 2019-01-10 DIAGNOSIS — I480 Paroxysmal atrial fibrillation: Secondary | ICD-10-CM | POA: Diagnosis not present

## 2019-01-10 DIAGNOSIS — Z823 Family history of stroke: Secondary | ICD-10-CM | POA: Diagnosis not present

## 2019-01-10 DIAGNOSIS — Z800 Family history of malignant neoplasm of digestive organs: Secondary | ICD-10-CM | POA: Diagnosis not present

## 2019-01-10 DIAGNOSIS — E039 Hypothyroidism, unspecified: Secondary | ICD-10-CM | POA: Diagnosis not present

## 2019-01-10 LAB — TYPE AND SCREEN
ABO/RH(D): O POS
Antibody Screen: NEGATIVE
Unit division: 0

## 2019-01-10 LAB — CBC
HCT: 27.4 % — ABNORMAL LOW (ref 39.0–52.0)
HCT: 27 % — ABNORMAL LOW (ref 39.0–52.0)
Hemoglobin: 8.5 g/dL — ABNORMAL LOW (ref 13.0–17.0)
Hemoglobin: 8.3 g/dL — ABNORMAL LOW (ref 13.0–17.0)
MCH: 28.4 pg (ref 26.0–34.0)
MCH: 28.2 pg (ref 26.0–34.0)
MCHC: 31 g/dL (ref 30.0–36.0)
MCHC: 30.7 g/dL (ref 30.0–36.0)
MCV: 91.6 fL (ref 80.0–100.0)
MCV: 91.8 fL (ref 80.0–100.0)
Platelets: 222 10*3/uL (ref 150–400)
Platelets: 247 10*3/uL (ref 150–400)
RBC: 2.99 MIL/uL — ABNORMAL LOW (ref 4.22–5.81)
RBC: 2.94 MIL/uL — ABNORMAL LOW (ref 4.22–5.81)
RDW: 15.7 % — ABNORMAL HIGH (ref 11.5–15.5)
RDW: 15.8 % — ABNORMAL HIGH (ref 11.5–15.5)
WBC: 6 10*3/uL (ref 4.0–10.5)
WBC: 5.3 10*3/uL (ref 4.0–10.5)
nRBC: 0 % (ref 0.0–0.2)
nRBC: 0 % (ref 0.0–0.2)

## 2019-01-10 LAB — VITAMIN B12: Vitamin B-12: 939 pg/mL — ABNORMAL HIGH (ref 180–914)

## 2019-01-10 LAB — RETICULOCYTES
Immature Retic Fract: 27.2 % — ABNORMAL HIGH (ref 2.3–15.9)
RBC.: 2.99 MIL/uL — ABNORMAL LOW (ref 4.22–5.81)
Retic Count, Absolute: 93.9 10*3/uL (ref 19.0–186.0)
Retic Ct Pct: 3.1 % (ref 0.4–3.1)

## 2019-01-10 LAB — PROTIME-INR
INR: 1.2 (ref 0.8–1.2)
Prothrombin Time: 14.7 seconds (ref 11.4–15.2)

## 2019-01-10 LAB — BPAM RBC
Blood Product Expiration Date: 202012162359
ISSUE DATE / TIME: 202011172338
Unit Type and Rh: 5100

## 2019-01-10 LAB — SARS CORONAVIRUS 2 (TAT 6-24 HRS): SARS Coronavirus 2: NEGATIVE

## 2019-01-10 LAB — ABO/RH: ABO/RH(D): O POS

## 2019-01-10 LAB — FOLATE: Folate: 41.2 ng/mL (ref >5.9)

## 2019-01-10 LAB — LACTATE DEHYDROGENASE: LDH: 169 U/L (ref 98–192)

## 2019-01-10 MED ORDER — ADULT MULTIVITAMIN W/MINERALS CH
1.0000 | ORAL_TABLET | Freq: Every day | ORAL | Status: DC
  Administered 2019-01-10 – 2019-01-12 (×2): 1 via ORAL
  Filled 2019-01-10 (×2): qty 1

## 2019-01-10 MED ORDER — ENSURE ENLIVE PO LIQD
237.0000 mL | Freq: Two times a day (BID) | ORAL | Status: DC
  Administered 2019-01-10 – 2019-01-12 (×3): 237 mL via ORAL

## 2019-01-10 MED ORDER — PANTOPRAZOLE SODIUM 40 MG PO TBEC: 40.0000 mg | DELAYED_RELEASE_TABLET | Freq: Two times a day (BID) | ORAL | Status: DC

## 2019-01-10 MED ORDER — SODIUM CHLORIDE 0.9 % IV SOLN
INTRAVENOUS | Status: DC
  Administered 2019-01-10: 21:00:00 via INTRAVENOUS

## 2019-01-10 MED ORDER — PANTOPRAZOLE SODIUM 40 MG IV SOLR
40.0000 mg | Freq: Two times a day (BID) | INTRAVENOUS | Status: DC
  Administered 2019-01-10 – 2019-01-11 (×3): 40 mg via INTRAVENOUS
  Filled 2019-01-10 (×3): qty 40

## 2019-01-10 NOTE — H&P (View-Only) (Signed)
Consultation  Referring Provider:      Primary Care Physician:  [name], MD Primary Gastroenterologist:         Reason for Consultation:          Impression / Plan:   Melena with acute blood loss anemia, suspect upper GI bleed  He is improved after transfusion   Recommendations are for twice daily PPI, n.p.o. after midnight with plans for EGD tomorrow.  I think it is quite possible he has gastritis or ulcer in the setting of chronic aspirin use and no PPI therapy.  Given his age and comorbidities though he is improved I think he needs a diagnostic procedure, it probably will not be therapeutic as he seems improved but we need to prognosticate regarding cause of bleeding.     The risks and benefits as well as alternatives of endoscopic procedure(s) have been discussed and reviewed. All questions answered. The patient agrees to proceed. We will also check coags serial hemoglobins.  Should EGD be negative, given his age and comorbidities would not jump right to a colonoscopy though it might be needed.  Other considerations would be whether or not he has some sort of prostate cancer metastases and invaded the small bowel or other GI origin.   I appreciate the opportunity to care for this patient. Gatha Mayer, MD, [facility] 01/10/2019 10:48 AM    HPI:   Luis Buba. is a 85 y.o. male with a history of prostate cancer and adenomatous colon polyps (last colonoscopy 2012 Dr. Sharlett Iles 2 adenomas diverticulosis) who was being treated for suspected diverticulitis by primary care recently and developed very dark tarry stools for several days or more.  He had a telehealth visit and Dr. Sarajane Jews had him do a hemoglobin which returned low at 7.  Previously 8.8 on January 30.  10.2 in December 2019.  Searching in care everywhere shows that it was 9.7 in July at [facility] . in the emergency department hemoglobin repeat was 7.4 in the ER than 6.8 and  he received a transfusion of 1 unit of packed red cells and his hemoglobin is 8.5.  He had been we can lightheaded and there was some question of ataxia so a CT of the head was done and was negative.  Denies any abdominal pain.  Main issue is chronic leakage of urine after TURP.  He is concerned about getting back to his wife at friend's home.  Last 2 stools have been brown.  He does have prostate cancer that was treated with radiation in 2000 and then he had locally advanced disease, on androgen deprivation therapy followed in [facility].  Seen by Dr. Alen Blew last month things were stable overall it seems and continued androgen deprivation therapy.  Past Medical History:  Diagnosis Date  . Complication of anesthesia    "serious cognisance problems since my OR in January" (03/13/2013  . Coronary artery disease   . Hx of echocardiogram    Echo 5/16:  Mild focal basal septal hypertrophy, EF 55%, mild AI, MV repair ok with trivial MR (mean 3 mmHg), mild LAE, mild RAE, PASP 35 mmHg  . Hypertension   . Irritable bowel syndrome   . PAF (paroxysmal atrial fibrillation) ([place]) 05/03/2014  . Personal history of colonic polyps    tubular adenoma  . Pleural effusion, bilateral 03/12/2013   Small to moderate, L>R  . Prostate cancer [facility]) 1991   sees Dr. Carlota Raspberry at  [facility], observation only   . S/P CABG x 2 02/28/2013   LIMA to LAD, SVG to OM1, EVH via right thigh  . S/P Maze operation for atrial fibrillation 02/28/2013   Complete bilateral atrial lesion set using bipolar radiofrequency and cryothermy ablation with clipping of LA appendage  . S/P mitral valve repair, maze procedure, and CABG x2 02/28/2013   Complex valvuloplasty including triangular resection of posterior leaflet, 30 mm Sorin Memo 3D ring annuloplasty    Past Surgical History:  Procedure Laterality Date  . CARDIAC CATHETERIZATION  2015  . CATARACT EXTRACTION, BILATERAL Bilateral 2014   per Dr. Gershon Crane   . COLONOSCOPY  2011   per Dr.  Sharlett Iles, benign polyps, no repeats needed   . CORONARY ARTERY BYPASS GRAFT N/A 02/28/2013   Procedure: CORONARY ARTERY BYPASS GRAFTING (CABG);  Surgeon: Rexene Alberts, MD;  Location: [place];  Service: Open Heart Surgery;  Laterality: N/A;  CABG x 2,  using left internal mammary artery and right leg saphenous vein harvested endoscopically  . HEMORRHOID SURGERY    . INTRAOPERATIVE TRANSESOPHAGEAL ECHOCARDIOGRAM N/A 02/28/2013   Procedure: INTRAOPERATIVE TRANSESOPHAGEAL ECHOCARDIOGRAM;  Surgeon: Rexene Alberts, MD;  Location: [place];  Service: Open Heart Surgery;  Laterality: N/A;  . LEFT HEART CATHETERIZATION WITH CORONARY ANGIOGRAM N/A 02/26/2013   Procedure: LEFT HEART CATHETERIZATION WITH CORONARY ANGIOGRAM;  Surgeon: Leonie Man, MD;  Location[facility] CATH LAB;  Service: Cardiovascular;  Laterality: N/A;  . MAZE N/A 02/28/2013   Procedure: MAZE;  Surgeon: Rexene Alberts, MD;  Location: [place];  Service: Open Heart Surgery;  Laterality: N/A;  . MITRAL VALVE REPAIR N/A 02/28/2013   Procedure: MITRAL VALVE REPAIR (MVR);  Surgeon: Rexene Alberts, MD;  Location: [place];  Service: Open Heart Surgery;  Laterality: N/A;  . PROSTATE BIOPSY    . TRANSURETHRAL RESECTION OF PROSTATE N/A 02/04/2017   Procedure: TRANSURETHRAL RESECTION OF THE PROSTATE (TURP);  Surgeon: Alexis Frock, MD;  Location: [facility];  Service: Urology;  Laterality: N/A;    Family History  Problem Relation Age of Onset  . Hypertension Mother   . Sudden death Mother   . Hypertension Father   . Sudden death Father   . Heart disease Son   . Diabetes Other   . Hypertension Other   . Sudden death Other   . Heart disease Other   . Stroke Other   . Lung cancer Brother   . Stomach cancer Brother   . Cancer Daughter   . Cancer Brother   . Diabetes Sister     Social History   Tobacco Use  . Smoking status: Never Smoker  . Smokeless tobacco: Never Used  Substance Use Topics  . Alcohol use: No    Alcohol/week: 0.0 standard drinks   . Drug use: No    Social History   Social History Narrative   Married retired Biomedical scientist   Lives at [facility], independent living though his wife is in a wheelchair and they help each other   1 son lives in [place] a daughter lives in [place]   Never smoker no alcohol or drugs     Prior to Admission medications   Medication Sig Start Date End Date Taking? Authorizing Provider  amoxicillin (AMOXIL) 500 MG capsule Take 2,000 mg by mouth as directed. Prior to dental appointment 07/30/14  Yes [provider]  aspirin EC 81 MG tablet Take 1 tablet (81 mg total) by mouth daily. 11/07/17  Yes Fay Records, MD  atorvastatin (LIPITOR) 10 MG tablet TAKE 1 TABLET BY MOUTH EVERY DAY AT 6:00 PM Patient taking differently: Take 10 mg by mouth daily at 6 PM.  01/30/18  Yes [name], MD  Cholecalciferol (VITAMIN D3 PO) Take 2,000 Units by mouth daily.   Yes [provider]  ciprofloxacin (CIPRO) 500 MG tablet Take 1 tablet (500 mg total) by mouth 2 (two) times daily. 01/08/19  Yes [name], MD  diphenoxylate-atropine (LOMOTIL) 2.5-0.025 MG tablet TAKE 1 TABLET BY MOUTH 4 TIMES DAILY AS NEEDED FOR  DIARRHEA  OR  LOOSE  STOOLS  (AT  [place]) Patient taking differently: Take 1 tablet by mouth 4 (four) times daily as needed for diarrhea or loose stools.  08/01/18  Yes Tyler Pita, MD  fish oil-omega-3 fatty acids 1000 MG capsule Take 1 g by mouth every morning.    Yes [provider]  Leuprolide Acetate, 6 Month, (LUPRON) 45 MG injection Inject 45 mg into the muscle every 3 (three) months.    Yes [provider]  levothyroxine (SYNTHROID, LEVOTHROID) 50 MCG tablet Take 1 tablet (50 mcg total) by mouth every morning. 01/30/18  Yes [name], MD  metoprolol tartrate (LOPRESSOR) 25 MG tablet TAKE ONE TABLET BY MOUTH TWICE A DAY Patient taking differently: Take 25 mg by mouth 2 (two) times daily.  01/05/19  Yes  [name], MD  metroNIDAZOLE (FLAGYL) 500 MG tablet Take 1 tablet (500 mg total) by mouth 3 (three) times daily. 01/08/19  Yes [name], MD  vitamin C (ASCORBIC ACID) 500 MG tablet Take 500 mg by mouth every morning.    Yes [provider]  diclofenac sodium (VOLTAREN) 1 % GEL Apply 2 g topically 4 (four) times daily. Patient not taking: Reported on 01/09/2019 04/23/18   Delia Heady, PA-C  lidocaine (LIDODERM) 5 % Place 2 patches onto the skin daily. Remove & Discard patch within 12 hours or as directed by MD on shoulder and hip. Patient not taking: Reported on 01/09/2019 09/14/18   Gareth Morgan, MD  meclizine (ANTIVERT) 25 MG tablet Take 1 tablet (25 mg total) by mouth 3 (three) times daily as needed. Patient not taking: Reported on 01/09/2019 05/03/14   Glendell Docker, NP  nitroGLYCERIN (NITROSTAT) 0.4 MG SL tablet Place 1 tablet (0.4 mg total) under the tongue every 5 (five) minutes as needed for chest pain. 03/17/15   Fay Records, MD  triamcinolone cream (KENALOG) 0.1 % Apply 1 application topically 2 (two) times daily. Patient not taking: Reported on 01/09/2019 05/02/18   [name], MD    Current Facility-Administered Medications  Medication Dose Route Frequency Provider Last Rate Last Dose  . acetaminophen (TYLENOL) tablet 650 mg  650 mg Oral Q6H PRN Shela Leff, MD   650 mg at 01/10/19 [id number]   Or  . acetaminophen (TYLENOL) suppository 650 mg  650 mg Rectal Q6H PRN Shela Leff, MD      . atorvastatin (LIPITOR) tablet 10 mg  10 mg Oral q1800 Shela Leff, MD      . feeding supplement (ENSURE ENLIVE) (ENSURE ENLIVE) liquid 237 mL  237 mL Oral BID BM Adhikari, Amrit, MD      . levothyroxine (SYNTHROID) tablet 50 mcg  50 mcg Oral QAC breakfast Shela Leff, MD   50 mcg at 01/10/19 0557  . multivitamin with minerals tablet 1 tablet  1 tablet Oral Daily Shelly Coss, MD  Allergies as of 01/09/2019 - Review Complete 01/09/2019   Allergen Reaction Noted  . Other Other (See Comments) 08/15/2014     Review of Systems:    This is positive for those things mentioned in the HPI, also positive for fatigue and weakness which is a bit improved after the blood transfusion All other review of systems are negative.       Physical Exam:  Vital signs in last 24 hours: Temp:  [98 F (36.7 C)-99 F (37.2 C)] 98.9 F (37.2 C) (11/18 0501) Pulse Rate:  [48-85] 73 (11/18 0501) Resp:  [16-20] 19 (11/18 0501) BP: (108-155)/(54-80) 149/80 (11/18 0501) SpO2:  [94 %-100 %] 96 % (11/18 0501) Weight:  [74.9 kg-76.2 kg] 74.9 kg (11/17 2108) Last BM Date: 01/09/19  General:  Vigorous elderly white man in no acute distress Eyes:  anicteric.  Pale conjunctiva Lungs: Clear to auscultation bilaterally. Heart:  S1S2, no rubs, murmurs, gallops. Abdomen:  soft, non-tender, no hepatosplenomegaly, hernia, or mass and BS+.  Lymph:  no cervical or supraclavicular adenopathy. Extremities:   no edema Skin   no rash.  Is pale Neuro:  A&O x 3.  Psych:  appropriate mood and  Affect.   Data Reviewed:   LAB RESULTS: Recent Labs    01/09/19 1113 01/09/19 1713 01/09/19 2104 01/10/19 0801  WBC 5.1 5.3  --  6.0  HGB 7.0 Repeated and verified X2.* 7.4* 6.8* 8.5*  HCT 21.7 Repeated and verified X2.* 25.3* 22.6* 27.4*  PLT 242.0 245  --  222   BMET Recent Labs    01/09/19 1713  NA 141  K 4.2  CL 109  CO2 26  GLUCOSE 102*  BUN 27*  CREATININE 1.06  CALCIUM 8.6*   LFT Recent Labs    01/09/19 1713  PROT 6.6  ALBUMIN 3.4*  AST 30  ALT 26  ALKPHOS 72  BILITOT 0.4   PT/INR No results for input(s): LABPROT, INR in the last 72 hours.  STUDIES: Ct Head Wo Contrast  Result Date: 01/09/2019 CLINICAL DATA:  Multiple falls ataxia EXAM: CT HEAD WITHOUT CONTRAST TECHNIQUE: Contiguous axial images were obtained from the base of the skull through the vertex without intravenous contrast. COMPARISON:  CT brain 09/14/2018  FINDINGS: Brain: No acute territorial infarction, hemorrhage or intracranial mass. Atrophy and small vessel ischemic changes of the white matter. Chronic lacunar infarcts in the bilateral basal ganglia and left cerebellum. Stable ventricle size Vascular: No hyperdense vessels. Vertebral and carotid vascular calcification Skull: Normal. Negative for fracture or focal lesion. Sinuses/Orbits: No acute finding. Other: None IMPRESSION: 1. No CT evidence for acute intracranial abnormality. 2. Atrophy and chronic small vessel ischemic changes of the white matter Electronically Signed   By: Donavan Foil M.D.   On: 01/09/2019 20:24        Thanks   LOS: 0 days   @  Simonne Maffucci, MD, [facility] @  01/10/2019, 10:36 AM

## 2019-01-10 NOTE — Progress Notes (Signed)
PT Cancellation Note  Patient Details Name: [name]. MRN: [id number] DOB: 1927/08/29   Cancelled Treatment:    Reason Eval/Treat Not Completed: Medical issues which prohibited therapy--hgb 6.8. Will hold PT for now and check back at a later time.   Weston Anna, PT Acute Rehabilitation Services Pager: [phone] Office: [phone]

## 2019-01-10 NOTE — Consult Note (Addendum)
Consultation  Referring Provider:      Primary Care Physician:  [name], MD Primary Gastroenterologist:         Reason for Consultation:          Impression / Plan:   Melena with acute blood loss anemia, suspect upper GI bleed  He is improved after transfusion   Recommendations are for twice daily PPI, n.p.o. after midnight with plans for EGD tomorrow.  I think it is quite possible he has gastritis or ulcer in the setting of chronic aspirin use and no PPI therapy.  Given his age and comorbidities though he is improved I think he needs a diagnostic procedure, it probably will not be therapeutic as he seems improved but we need to prognosticate regarding cause of bleeding.     The risks and benefits as well as alternatives of endoscopic procedure(s) have been discussed and reviewed. All questions answered. The patient agrees to proceed. We will also check coags serial hemoglobins.  Should EGD be negative, given his age and comorbidities would not jump right to a colonoscopy though it might be needed.  Other considerations would be whether or not he has some sort of prostate cancer metastases and invaded the small bowel or other GI origin.   I appreciate the opportunity to care for this patient. Gatha Mayer, MD, [facility] 01/10/2019 10:48 AM    HPI:   Luis Buba. is a 85 y.o. male with a history of prostate cancer and adenomatous colon polyps (last colonoscopy 2012 Dr. Sharlett Iles 2 adenomas diverticulosis) who was being treated for suspected diverticulitis by primary care recently and developed very dark tarry stools for several days or more.  He had a telehealth visit and Dr. Sarajane Jews had him do a hemoglobin which returned low at 7.  Previously 8.8 on January 30.  10.2 in December 2019.  Searching in care everywhere shows that it was 9.7 in July at [facility] . in the emergency department hemoglobin repeat was 7.4 in the ER than 6.8 and  he received a transfusion of 1 unit of packed red cells and his hemoglobin is 8.5.  He had been we can lightheaded and there was some question of ataxia so a CT of the head was done and was negative.  Denies any abdominal pain.  Main issue is chronic leakage of urine after TURP.  He is concerned about getting back to his wife at friend's home.  Last 2 stools have been brown.  He does have prostate cancer that was treated with radiation in 2000 and then he had locally advanced disease, on androgen deprivation therapy followed in [facility].  Seen by Dr. Alen Blew last month things were stable overall it seems and continued androgen deprivation therapy.  Past Medical History:  Diagnosis Date  . Complication of anesthesia    "serious cognisance problems since my OR in January" (03/13/2013  . Coronary artery disease   . Hx of echocardiogram    Echo 5/16:  Mild focal basal septal hypertrophy, EF 55%, mild AI, MV repair ok with trivial MR (mean 3 mmHg), mild LAE, mild RAE, PASP 35 mmHg  . Hypertension   . Irritable bowel syndrome   . PAF (paroxysmal atrial fibrillation) ([place]) 05/03/2014  . Personal history of colonic polyps    tubular adenoma  . Pleural effusion, bilateral 03/12/2013   Small to moderate, L>R  . Prostate cancer [facility]) 1991   sees Dr. Carlota Raspberry at  [facility], observation only   . S/P CABG x 2 02/28/2013   LIMA to LAD, SVG to OM1, EVH via right thigh  . S/P Maze operation for atrial fibrillation 02/28/2013   Complete bilateral atrial lesion set using bipolar radiofrequency and cryothermy ablation with clipping of LA appendage  . S/P mitral valve repair, maze procedure, and CABG x2 02/28/2013   Complex valvuloplasty including triangular resection of posterior leaflet, 30 mm Sorin Memo 3D ring annuloplasty    Past Surgical History:  Procedure Laterality Date  . CARDIAC CATHETERIZATION  2015  . CATARACT EXTRACTION, BILATERAL Bilateral 2014   per Dr. Gershon Crane   . COLONOSCOPY  2011   per Dr.  Sharlett Iles, benign polyps, no repeats needed   . CORONARY ARTERY BYPASS GRAFT N/A 02/28/2013   Procedure: CORONARY ARTERY BYPASS GRAFTING (CABG);  Surgeon: Rexene Alberts, MD;  Location: [place];  Service: Open Heart Surgery;  Laterality: N/A;  CABG x 2,  using left internal mammary artery and right leg saphenous vein harvested endoscopically  . HEMORRHOID SURGERY    . INTRAOPERATIVE TRANSESOPHAGEAL ECHOCARDIOGRAM N/A 02/28/2013   Procedure: INTRAOPERATIVE TRANSESOPHAGEAL ECHOCARDIOGRAM;  Surgeon: Rexene Alberts, MD;  Location: [place];  Service: Open Heart Surgery;  Laterality: N/A;  . LEFT HEART CATHETERIZATION WITH CORONARY ANGIOGRAM N/A 02/26/2013   Procedure: LEFT HEART CATHETERIZATION WITH CORONARY ANGIOGRAM;  Surgeon: Leonie Man, MD;  Location: [facility] CATH LAB;  Service: Cardiovascular;  Laterality: N/A;  . MAZE N/A 02/28/2013   Procedure: MAZE;  Surgeon: Rexene Alberts, MD;  Location: [place];  Service: Open Heart Surgery;  Laterality: N/A;  . MITRAL VALVE REPAIR N/A 02/28/2013   Procedure: MITRAL VALVE REPAIR (MVR);  Surgeon: Rexene Alberts, MD;  Location: [place];  Service: Open Heart Surgery;  Laterality: N/A;  . PROSTATE BIOPSY    . TRANSURETHRAL RESECTION OF PROSTATE N/A 02/04/2017   Procedure: TRANSURETHRAL RESECTION OF THE PROSTATE (TURP);  Surgeon: Alexis Frock, MD;  Location: [facility];  Service: Urology;  Laterality: N/A;    Family History  Problem Relation Age of Onset  . Hypertension Mother   . Sudden death Mother   . Hypertension Father   . Sudden death Father   . Heart disease Son   . Diabetes Other   . Hypertension Other   . Sudden death Other   . Heart disease Other   . Stroke Other   . Lung cancer Brother   . Stomach cancer Brother   . Cancer Daughter   . Cancer Brother   . Diabetes Sister     Social History   Tobacco Use  . Smoking status: Never Smoker  . Smokeless tobacco: Never Used  Substance Use Topics  . Alcohol use: No    Alcohol/week: 0.0 standard drinks   . Drug use: No    Social History   Social History Narrative   Married retired Biomedical scientist   Lives at [facility], independent living though his wife is in a wheelchair and they help each other   1 son lives in [place] a daughter lives in [place]   Never smoker no alcohol or drugs     Prior to Admission medications   Medication Sig Start Date End Date Taking? Authorizing Provider  amoxicillin (AMOXIL) 500 MG capsule Take 2,000 mg by mouth as directed. Prior to dental appointment 07/30/14  Yes [provider]  aspirin EC 81 MG tablet Take 1 tablet (81 mg total) by mouth daily. 11/07/17  Yes Fay Records, MD  atorvastatin (LIPITOR) 10 MG tablet TAKE 1 TABLET BY MOUTH EVERY DAY AT 6:00 PM Patient taking differently: Take 10 mg by mouth daily at 6 PM.  01/30/18  Yes [name], MD  Cholecalciferol (VITAMIN D3 PO) Take 2,000 Units by mouth daily.   Yes [provider]  ciprofloxacin (CIPRO) 500 MG tablet Take 1 tablet (500 mg total) by mouth 2 (two) times daily. 01/08/19  Yes [name], MD  diphenoxylate-atropine (LOMOTIL) 2.5-0.025 MG tablet TAKE 1 TABLET BY MOUTH 4 TIMES DAILY AS NEEDED FOR  DIARRHEA  OR  LOOSE  STOOLS  (AT  [place]) Patient taking differently: Take 1 tablet by mouth 4 (four) times daily as needed for diarrhea or loose stools.  08/01/18  Yes Tyler Pita, MD  fish oil-omega-3 fatty acids 1000 MG capsule Take 1 g by mouth every morning.    Yes [provider]  Leuprolide Acetate, 6 Month, (LUPRON) 45 MG injection Inject 45 mg into the muscle every 3 (three) months.    Yes [provider]  levothyroxine (SYNTHROID, LEVOTHROID) 50 MCG tablet Take 1 tablet (50 mcg total) by mouth every morning. 01/30/18  Yes [name], MD  metoprolol tartrate (LOPRESSOR) 25 MG tablet TAKE ONE TABLET BY MOUTH TWICE A DAY Patient taking differently: Take 25 mg by mouth 2 (two) times daily.  01/05/19  Yes  [name], MD  metroNIDAZOLE (FLAGYL) 500 MG tablet Take 1 tablet (500 mg total) by mouth 3 (three) times daily. 01/08/19  Yes [name], MD  vitamin C (ASCORBIC ACID) 500 MG tablet Take 500 mg by mouth every morning.    Yes [provider]  diclofenac sodium (VOLTAREN) 1 % GEL Apply 2 g topically 4 (four) times daily. Patient not taking: Reported on 01/09/2019 04/23/18   Delia Heady, PA-C  lidocaine (LIDODERM) 5 % Place 2 patches onto the skin daily. Remove & Discard patch within 12 hours or as directed by MD on shoulder and hip. Patient not taking: Reported on 01/09/2019 09/14/18   Gareth Morgan, MD  meclizine (ANTIVERT) 25 MG tablet Take 1 tablet (25 mg total) by mouth 3 (three) times daily as needed. Patient not taking: Reported on 01/09/2019 05/03/14   Glendell Docker, NP  nitroGLYCERIN (NITROSTAT) 0.4 MG SL tablet Place 1 tablet (0.4 mg total) under the tongue every 5 (five) minutes as needed for chest pain. 03/17/15   Fay Records, MD  triamcinolone cream (KENALOG) 0.1 % Apply 1 application topically 2 (two) times daily. Patient not taking: Reported on 01/09/2019 05/02/18   [name], MD    Current Facility-Administered Medications  Medication Dose Route Frequency Provider Last Rate Last Dose  . acetaminophen (TYLENOL) tablet 650 mg  650 mg Oral Q6H PRN Shela Leff, MD   650 mg at 01/10/19 [id number]   Or  . acetaminophen (TYLENOL) suppository 650 mg  650 mg Rectal Q6H PRN Shela Leff, MD      . atorvastatin (LIPITOR) tablet 10 mg  10 mg Oral q1800 Shela Leff, MD      . feeding supplement (ENSURE ENLIVE) (ENSURE ENLIVE) liquid 237 mL  237 mL Oral BID BM Adhikari, Amrit, MD      . levothyroxine (SYNTHROID) tablet 50 mcg  50 mcg Oral QAC breakfast Shela Leff, MD   50 mcg at 01/10/19 0557  . multivitamin with minerals tablet 1 tablet  1 tablet Oral Daily Shelly Coss, MD  Allergies as of 01/09/2019 - Review Complete 01/09/2019   Allergen Reaction Noted  . Other Other (See Comments) 08/15/2014     Review of Systems:    This is positive for those things mentioned in the HPI, also positive for fatigue and weakness which is a bit improved after the blood transfusion All other review of systems are negative.       Physical Exam:  Vital signs in last 24 hours: Temp:  [98 F (36.7 C)-99 F (37.2 C)] 98.9 F (37.2 C) (11/18 0501) Pulse Rate:  [48-85] 73 (11/18 0501) Resp:  [16-20] 19 (11/18 0501) BP: (108-155)/(54-80) 149/80 (11/18 0501) SpO2:  [94 %-100 %] 96 % (11/18 0501) Weight:  [74.9 kg-76.2 kg] 74.9 kg (11/17 2108) Last BM Date: 01/09/19  General:  Vigorous elderly white man in no acute distress Eyes:  anicteric.  Pale conjunctiva Lungs: Clear to auscultation bilaterally. Heart:  S1S2, no rubs, murmurs, gallops. Abdomen:  soft, non-tender, no hepatosplenomegaly, hernia, or mass and BS+.  Lymph:  no cervical or supraclavicular adenopathy. Extremities:   no edema Skin   no rash.  Is pale Neuro:  A&O x 3.  Psych:  appropriate mood and  Affect.   Data Reviewed:   LAB RESULTS: Recent Labs    01/09/19 1113 01/09/19 1713 01/09/19 2104 01/10/19 0801  WBC 5.1 5.3  --  6.0  HGB 7.0 Repeated and verified X2.* 7.4* 6.8* 8.5*  HCT 21.7 Repeated and verified X2.* 25.3* 22.6* 27.4*  PLT 242.0 245  --  222   BMET Recent Labs    01/09/19 1713  NA 141  K 4.2  CL 109  CO2 26  GLUCOSE 102*  BUN 27*  CREATININE 1.06  CALCIUM 8.6*   LFT Recent Labs    01/09/19 1713  PROT 6.6  ALBUMIN 3.4*  AST 30  ALT 26  ALKPHOS 72  BILITOT 0.4   PT/INR No results for input(s): LABPROT, INR in the last 72 hours.  STUDIES: Ct Head Wo Contrast  Result Date: 01/09/2019 CLINICAL DATA:  Multiple falls ataxia EXAM: CT HEAD WITHOUT CONTRAST TECHNIQUE: Contiguous axial images were obtained from the base of the skull through the vertex without intravenous contrast. COMPARISON:  CT brain 09/14/2018  FINDINGS: Brain: No acute territorial infarction, hemorrhage or intracranial mass. Atrophy and small vessel ischemic changes of the white matter. Chronic lacunar infarcts in the bilateral basal ganglia and left cerebellum. Stable ventricle size Vascular: No hyperdense vessels. Vertebral and carotid vascular calcification Skull: Normal. Negative for fracture or focal lesion. Sinuses/Orbits: No acute finding. Other: None IMPRESSION: 1. No CT evidence for acute intracranial abnormality. 2. Atrophy and chronic small vessel ischemic changes of the white matter Electronically Signed   By: Donavan Foil M.D.   On: 01/09/2019 20:24        Thanks   LOS: 0 days   @  Simonne Maffucci, MD, [facility] @  01/10/2019, 10:36 AM

## 2019-01-10 NOTE — Evaluation (Signed)
Physical Therapy Evaluation Patient Details Name: [name]. MRN: [id number] DOB: 1927/04/11 Today's Date: 01/10/2019   History of Present Illness  85 yo male admitted with symptomatic anemia. Hx of IBS, prostate Ca, CABG, falls  Clinical Impression  On eval, pt was Min guard assist for mobility for the most part (except for needing help to get off of low commode seat in bathroom). He walked ~150 feet with use of his rollator. He tolerated activity well. He denied lightheadedness. Recommend HHPT f/u for general strength and balance training. Pt is also requesting to speak with a CM about resources for getting assistance in home for his wife.     Follow Up Recommendations Home health PT    Equipment Recommendations  None recommended by PT    Recommendations for Other Services       Precautions / Restrictions Precautions Precautions: Fall Restrictions Weight Bearing Restrictions: No      Mobility  Bed Mobility               General bed mobility comments: oob in recliner  Transfers Overall transfer level: Needs assistance Equipment used: 4-wheeled walker Transfers: Sit to/from Stand Sit to Stand: Supervision         General transfer comment: for safety from recliner with armrest. Min assist from low toilet surface  Ambulation/Gait Ambulation/Gait assistance: Min guard Gait Distance (Feet): 150 Feet Assistive device: 4-wheeled walker Gait Pattern/deviations: Step-through pattern;Decreased stride length     General Gait Details: close guard for safety. no lob with RW use. pt denied lightheadedness  Stairs            Wheelchair Mobility    Modified Rankin (Stroke Patients Only)       Balance Overall balance assessment: History of Falls;Needs assistance           Standing balance-Leahy Scale: Fair                               Pertinent Vitals/Pain Pain Assessment: No/denies pain    Home Living Family/patient expects  to be discharged to:: Private residence Living Arrangements: Spouse/significant other Available Help at Discharge: (pt is caregiver for wife) Type of Home: Independent living facility Home Access: Level entry     Home Layout: One level Home Equipment: Environmental consultant - 4 wheels      Prior Function Level of Independence: Independent with assistive device(s)               Hand Dominance        Extremity/Trunk Assessment   Upper Extremity Assessment Upper Extremity Assessment: Generalized weakness    Lower Extremity Assessment Lower Extremity Assessment: Generalized weakness    Cervical / Trunk Assessment Cervical / Trunk Assessment: Kyphotic  Communication   Communication: HOH  Cognition Arousal/Alertness: Awake/alert Behavior During Therapy: WFL for tasks assessed/performed Overall Cognitive Status: Within Functional Limits for tasks assessed                                        General Comments      Exercises     Assessment/Plan    PT Assessment Patient needs continued PT services  PT Problem List Decreased strength;Decreased mobility;Decreased activity tolerance;Decreased balance;Decreased knowledge of use of DME       PT Treatment Interventions DME instruction;Gait training;Therapeutic activities;Therapeutic exercise;Patient/family education;Balance training;Functional mobility training  PT Goals (Current goals can be found in the Care Plan section)  Acute Rehab PT Goals Patient Stated Goal: home soon PT Goal Formulation: With patient Time For Goal Achievement: 01/24/19 Potential to Achieve Goals: Good    Frequency Min 3X/week   Barriers to discharge Decreased caregiver support pt has been the caregiver for his wife. He is requesting information on resources for her and himself.    Co-evaluation               AM-PAC PT "6 Clicks" Mobility  Outcome Measure Help needed turning from your back to your side while in a flat bed  without using bedrails?: A Little Help needed moving from lying on your back to sitting on the side of a flat bed without using bedrails?: A Little Help needed moving to and from a bed to a chair (including a wheelchair)?: A Little Help needed standing up from a chair using your arms (e.g., wheelchair or bedside chair)?: A Little Help needed to walk in hospital room?: A Little Help needed climbing 3-5 steps with a railing? : A Little 6 Click Score: 18    End of Session Equipment Utilized During Treatment: Gait belt Activity Tolerance: Patient tolerated treatment well Patient left: in chair;with call bell/phone within reach;with chair alarm set   PT Visit Diagnosis: Repeated falls (R29.6);History of falling (Z91.81);Muscle weakness (generalized) (M62.81)    Time: [id number] PT Time Calculation (min) (ACUTE ONLY): 35 min   Charges:   PT Evaluation $PT Eval Moderate Complexity: 1 Mod PT Treatments $Gait Training: 8-22 mins         Weston Anna, PT Acute Rehabilitation Services Pager: [phone] Office: [phone]

## 2019-01-10 NOTE — Progress Notes (Signed)
PROGRESS NOTE    [name].  TT:[id number] DOB: 02/06/1928 DOA: 01/09/2019 PCP: [name], MD   Brief Narrative:  Patient is 85 year old male with history of chronic polyps, diverticulosis, coronary disease status post CABG x2, history of mitral valve repair, hypertension, proximal A. fib, history of prostate cancer status post TURP/radiation therapy who was sent by his PCP for the evaluation of anemia.  Patient was complaining of black-colored stool for last several days.  He is on aspirin 81 mg daily at home.  He was also recently diagnosed with diverticular bleed and was prescribed 10 days course of Cipro and Flagyl.  Hemoglobin of 7.4 on presentation.  FOBT was negative.  He was transfused with 1 unit of PRBC for symptomatic anemia.  GI consulted today.  Plan for EGD tomorrow.  Assessment & Plan:   Principal Problem:   Symptomatic anemia Active Problems:   Hypothyroidism   Essential hypertension   CAD (coronary artery disease)   Fall at home, initial encounter   Melena   Acute blood loss anemia   Symptomatic acute blood loss anemia: Concern for upper GI bleed.  Presented with anemia.  Transfused with a unit of  PRBC.  Currently hemodynamically stable. Reported of having Melena at home.  On aspirin 81 mg daily.  Currently no active GI bleed.  Last colonoscopy done in 2012 showed 2 polyps in the ascending colon.  Also has history of diverticulosis. GI already consulted.  Plan for EGD tomorrow.  Continue PPI.  Aspirin on hold.  N.p.o. after midnight.  Coronary artery disease: Status post CABG.  On aspirin at home.  Might need to continue aspirin on discharge.  Hypertension: Currently blood pressure stable.  Antihypertensives on hold.  Hypothyroidism: Continue Synthyroid.  Frequent falls: Patient reports that she has been falling frequently recently. He lives with his wife. He uses walker.  Will request a PT/OT evaluation.  CT head did not show show any acute  intracranial abnormalities.    Nutrition Problem: Increased nutrient needs Etiology: acute illness      DVT prophylaxis:SCD Code Status: Full Family Communication: Called wife and daughter on phone, call not received Disposition Plan: Waiting for PT evaluation, work-up   Consultants: GI  Procedures: None  Antimicrobials:  Anti-infectives (From admission, onward)   None      Subjective: Patient seen and examined the bedside this morning.  Hemodynamically stable.  Looked comfortable during my evaluation.  No active bleeding at present.  Sitting in the chair.  Denies any nausea, vomiting or abdominal pain.  Having bowel movements.  Objective: Vitals:   01/09/19 2331 01/09/19 2358 01/10/19 0328 01/10/19 0501  BP: (!) 155/62 139/67 108/60 (!) 149/80  Pulse: (!) 48 85 77 73  Resp: 20 20 18 19   Temp: 98.3 F (36.8 C) 98.9 F (37.2 C) 98.6 F (37 C) 98.9 F (37.2 C)  TempSrc: Oral Oral Oral Oral  SpO2: 97% 96% 98% 96%  Weight:      Height:        Intake/Output Summary (Last 24 hours) at 01/10/2019 1249 Last data filed at 01/10/2019 0600 Gross per 24 hour  Intake 434 ml  Output 200 ml  Net 234 ml   Filed Weights   01/09/19 1558 01/09/19 2108  Weight: 76.2 kg 74.9 kg    Examination:  General exam: Pleasant elderly gentleman HEENT:PERRL, Ear/Nose normal on gross exam Respiratory system: Bilateral equal air entry, normal vesicular breath sounds, no wheezes or crackles  Cardiovascular system: S1 &  S2 heard, RRR. No JVD, murmurs, rubs, gallops or clicks. No pedal edema. Gastrointestinal system: Abdomen is nondistended, soft and nontender. No organomegaly or masses felt. Normal bowel sounds heard. Central nervous system: Alert and oriented. No focal neurological deficits. Extremities: No edema, no clubbing ,no cyanosis, distal peripheral pulses palpable. Skin: No rashes, lesions or ulcers,no icterus ,no pallor    Data Reviewed: I have personally reviewed  following labs and imaging studies  CBC: Recent Labs  Lab 01/09/19 1113 01/09/19 1713 01/09/19 2104 01/10/19 0801  WBC 5.1 5.3  --  6.0  NEUTROABS 4.2  --   --   --   HGB 7.0 Repeated and verified X2.* 7.4* 6.8* 8.5*  HCT 21.7 Repeated and verified X2.* 25.3* 22.6* 27.4*  MCV 87.0 94.4  --  91.6  PLT 242.0 245  --  [id number]   Basic Metabolic Panel: Recent Labs  Lab 01/09/19 1713  NA 141  K 4.2  CL 109  CO2 26  GLUCOSE 102*  BUN 27*  CREATININE 1.06  CALCIUM 8.6*   GFR: Estimated Creatinine Clearance: 47.2 mL/min (by C-G formula based on SCr of 1.06 mg/dL). Liver Function Tests: Recent Labs  Lab 01/09/19 1713  AST 30  ALT 26  ALKPHOS 72  BILITOT 0.4  PROT 6.6  ALBUMIN 3.4*   No results for input(s): LIPASE, AMYLASE in the last 168 hours. No results for input(s): AMMONIA in the last 168 hours. Coagulation Profile: No results for input(s): INR, PROTIME in the last 168 hours. Cardiac Enzymes: No results for input(s): CKTOTAL, CKMB, CKMBINDEX, TROPONINI in the last 168 hours. BNP (last 3 results) No results for input(s): PROBNP in the last 8760 hours. HbA1C: No results for input(s): HGBA1C in the last 72 hours. CBG: No results for input(s): GLUCAP in the last 168 hours. Lipid Profile: No results for input(s): CHOL, HDL, LDLCALC, TRIG, CHOLHDL, LDLDIRECT in the last 72 hours. Thyroid Function Tests: No results for input(s): TSH, T4TOTAL, FREET4, T3FREE, THYROIDAB in the last 72 hours. Anemia Panel: Recent Labs    01/10/19 0801  VITAMINB12 939*  FOLATE 41.2  RETICCTPCT 3.1   Sepsis Labs: No results for input(s): PROCALCITON, LATICACIDVEN in the last 168 hours.  Recent Results (from the past 240 hour(s))  SARS CORONAVIRUS 2 (TAT 6-24 HRS) Nasopharyngeal Nasopharyngeal Swab     Status: None   Collection Time: 01/09/19  7:01 PM   Specimen: Nasopharyngeal Swab  Result Value Ref Range Status   SARS Coronavirus 2 NEGATIVE NEGATIVE Final    Comment: (NOTE)  SARS-CoV-2 target nucleic acids are NOT DETECTED. The SARS-CoV-2 RNA is generally detectable in upper and lower respiratory specimens during the acute phase of infection. Negative results do not preclude SARS-CoV-2 infection, do not rule out co-infections with other pathogens, and should not be used as the sole basis for treatment or other patient management decisions. Negative results must be combined with clinical observations, patient history, and epidemiological information. The expected result is Negative. Fact Sheet for Patients: SugarRoll.be Fact Sheet for Healthcare Providers: https://www.woods-mathews.com/ This test is not yet approved or cleared by the [place] FDA and  has been authorized for detection and/or diagnosis of SARS-CoV-2 by FDA under an Emergency Use Authorization (EUA). This EUA will remain  in effect (meaning this test can be used) for the duration of the COVID-19 declaration under Section 56 4(b)(1) of the Act, 21 U.S.C. section 360bbb-3(b)(1), unless the authorization is terminated or revoked sooner. Performed at [facility], [place]  [address], [place], [place] [address]          Radiology Studies: Ct Head Wo Contrast  Result Date: 01/09/2019 CLINICAL DATA:  Multiple falls ataxia EXAM: CT HEAD WITHOUT CONTRAST TECHNIQUE: Contiguous axial images were obtained from the base of the skull through the vertex without intravenous contrast. COMPARISON:  CT brain 09/14/2018 FINDINGS: Brain: No acute territorial infarction, hemorrhage or intracranial mass. Atrophy and small vessel ischemic changes of the white matter. Chronic lacunar infarcts in the bilateral basal ganglia and left cerebellum. Stable ventricle size Vascular: No hyperdense vessels. Vertebral and carotid vascular calcification Skull: Normal. Negative for fracture or focal lesion. Sinuses/Orbits: No acute finding. Other: None IMPRESSION: 1. No CT  evidence for acute intracranial abnormality. 2. Atrophy and chronic small vessel ischemic changes of the white matter Electronically Signed   By: Donavan Foil M.D.   On: 01/09/2019 20:24        Scheduled Meds: . atorvastatin  10 mg Oral q1800  . feeding supplement (ENSURE ENLIVE)  237 mL Oral BID BM  . levothyroxine  50 mcg Oral QAC breakfast  . multivitamin with minerals  1 tablet Oral Daily  . pantoprazole (PROTONIX) IV  40 mg Intravenous Q12H   Continuous Infusions:   LOS: 0 days    Time spent: 25 mins.More than 50% of that time was spent in counseling and/or coordination of care.      Shelly Coss, MD [facility] Pager [phone]  If 7PM-7AM, please contact night-coverage [email or web address] Password [facility] 01/10/2019, 12:49 PM

## 2019-01-10 NOTE — Progress Notes (Signed)
Initial Nutrition Assessment  RD working remotely.  DOCUMENTATION CODES:   Not applicable  INTERVENTION:   - Ensure Enlive po BID, each supplement provides 350 kcal and 20 grams of protein  - MVI with minerals daily  NUTRITION DIAGNOSIS:   Increased nutrient needs related to acute illness as evidenced by estimated needs.  GOAL:   Patient will meet greater than or equal to 90% of their needs  MONITOR:   PO intake, Supplement acceptance, Labs, Weight trends  REASON FOR ASSESSMENT:   Malnutrition Screening Tool    ASSESSMENT:   85 year old male who presented to the ED on 11/17 with low hemoglobin. PMH of colon polyps, diverticulosis, IBS, CAD s/p CABG x 2, mitral valve repair, HTN, PAF, prostate cancer s/p transurethral resection of the prostate. Pt admitted with symptomatic acute blood loss anemia.   Unable to reach pt via phone call to room. It appeared that someone picked up the phone then hung it back up on RD's phone call attempts.  RD will attempt to obtain diet and weight history upon follow-up.  Reviewed weight history in chart. Noted pt with a 5.4 kg weight loss since 05/02/18. This is a 6.7% weight loss in 8 months which is not significant for timeframe but is concerning. Unsure why pt has been losing weight.  Given weight loss, RD will order oral nutrition supplements to aid pt in meeting kcal and protein needs during admission. Will also order daily MVI.  No meal completions documented at this time.  Medications reviewed.  Labs reviewed: hemoglobin 8.5  NUTRITION - FOCUSED PHYSICAL EXAM:  Unable to complete at this time. RD working remotely.  Diet Order:   Diet Order            Diet Heart Room service appropriate? Yes; Fluid consistency: Thin  Diet effective now              EDUCATION NEEDS:   No education needs have been identified at this time  Skin:  Skin Assessment: Reviewed RN Assessment  Last BM:  01/10/19 large type 2  Height:    Ht Readings from Last 1 Encounters:  01/09/19 5\' 9"  (1.753 m)    Weight:   Wt Readings from Last 1 Encounters:  01/09/19 74.9 kg    Ideal Body Weight:  72.7 kg  BMI:  Body mass index is 24.38 kg/m.  Estimated Nutritional Needs:   Kcal:  1650-1850  Protein:  80-95 grams  Fluid:  1.6-1.8 L    Gaynell Face, MS, RD, LDN Inpatient Clinical Dietitian Pager: [phone] Weekend/After Hours: [phone]

## 2019-01-11 ENCOUNTER — Inpatient Hospital Stay (HOSPITAL_COMMUNITY): Payer: Medicare HMO | Admitting: Certified Registered Nurse Anesthetist

## 2019-01-11 ENCOUNTER — Encounter (HOSPITAL_COMMUNITY)
Admission: EM | Disposition: A | Discharge status: Home Health | Payer: Self-pay | Source: Home / Self Care | Attending: Internal Medicine

## 2019-01-11 ENCOUNTER — Encounter (HOSPITAL_COMMUNITY): Payer: Self-pay | Admitting: *Deleted

## 2019-01-11 DIAGNOSIS — K449 Diaphragmatic hernia without obstruction or gangrene: Secondary | ICD-10-CM

## 2019-01-11 HISTORY — PX: ESOPHAGOGASTRODUODENOSCOPY (EGD) WITH PROPOFOL: SHX5813

## 2019-01-11 LAB — CBC
HCT: 26.2 % — ABNORMAL LOW (ref 39.0–52.0)
Hemoglobin: 7.9 g/dL — ABNORMAL LOW (ref 13.0–17.0)
MCH: 27.9 pg (ref 26.0–34.0)
MCHC: 30.2 g/dL (ref 30.0–36.0)
MCV: 92.6 fL (ref 80.0–100.0)
Platelets: 196 10*3/uL (ref 150–400)
RBC: 2.83 MIL/uL — ABNORMAL LOW (ref 4.22–5.81)
RDW: 15.7 % — ABNORMAL HIGH (ref 11.5–15.5)
WBC: 4.5 10*3/uL (ref 4.0–10.5)
nRBC: 0 % (ref 0.0–0.2)

## 2019-01-11 SURGERY — ESOPHAGOGASTRODUODENOSCOPY (EGD) WITH PROPOFOL
Anesthesia: Monitor Anesthesia Care

## 2019-01-11 MED ORDER — PROPOFOL 10 MG/ML IV BOLUS
INTRAVENOUS | Status: DC | PRN
  Administered 2019-01-11: 30 mg via INTRAVENOUS

## 2019-01-11 MED ORDER — PROPOFOL 500 MG/50ML IV EMUL
INTRAVENOUS | Status: DC | PRN
  Administered 2019-01-11: 100 ug/kg/min via INTRAVENOUS

## 2019-01-11 MED ORDER — DEXAMETHASONE SODIUM PHOSPHATE 10 MG/ML IJ SOLN
INTRAMUSCULAR | Status: DC | PRN
  Administered 2019-01-11: 5 mg via INTRAVENOUS

## 2019-01-11 MED ORDER — PROPOFOL 10 MG/ML IV BOLUS
INTRAVENOUS | Status: AC
  Filled 2019-01-11: qty 20

## 2019-01-11 MED ORDER — LACTATED RINGERS IV SOLN: INTRAVENOUS | Status: DC

## 2019-01-11 MED ORDER — LACTATED RINGERS IV SOLN
INTRAVENOUS | Status: DC | PRN
  Administered 2019-01-11: 12:00:00 via INTRAVENOUS

## 2019-01-11 MED ORDER — PROPOFOL 500 MG/50ML IV EMUL
INTRAVENOUS | Status: AC
  Filled 2019-01-11: qty 50

## 2019-01-11 MED ORDER — ONDANSETRON HCL 4 MG/2ML IJ SOLN
INTRAMUSCULAR | Status: DC | PRN
  Administered 2019-01-11: 4 mg via INTRAVENOUS

## 2019-01-11 SURGICAL SUPPLY — 15 items

## 2019-01-11 NOTE — Progress Notes (Signed)
PROGRESS NOTE    [name].  TT:[id number] DOB: 1928-01-27 DOA: 01/09/2019 PCP: [name], MD   Brief Narrative:  Patient is 85 year old male with history of chronic polyps, diverticulosis, coronary disease status post CABG x2, history of mitral valve repair, hypertension, proximal A. fib, history of prostate cancer status post TURP/radiation therapy who was sent by his PCP for the evaluation of anemia.  Patient was complaining of black-colored stool for last several days.  He is on aspirin 81 mg daily at home.  He was also recently diagnosed with diverticular bleed and was prescribed 10 days course of Cipro and Flagyl.  Hemoglobin of 7.4 on presentation.  FOBT was negative.  He was transfused with 1 unit of PRBC for symptomatic anemia.  GI consulted .upper endoscopy did not show any active bleeding or ulcers.  Plan for discharge to home tomorrow.  Assessment & Plan:   Principal Problem:   Symptomatic anemia Active Problems:   Hypothyroidism   Essential hypertension   CAD (coronary artery disease)   Fall at home, initial encounter   Melena   Acute blood loss anemia   GI bleed   Symptomatic acute blood loss anemia: Concern for upper GI bleed.  Presented with anemia.  Transfused with a unit of  PRBC.  Currently hemodynamically stable. Reported of having Melena at home.  He was on  aspirin 81 mg daily.  Currently no active GI bleed.  Last colonoscopy done in 2012 showed 2 polyps in the ascending colon.  Also has history of diverticulosis. GI  consulted.   EGD did not show any active bleeding or ulcers.  Swords large hiatal hernia.  GI recommended to check H&H tomorrow morning.  GI recommended to discontinue aspirin permanently.  Coronary artery disease: Status post CABG.  On aspirin at home.  We will discontinue aspirin on discharge.  Hypertension: Currently blood pressure stable.  Antihypertensives on hold.  Hypothyroidism: Continue Synthyroid.  Frequent falls: Patient  reports that she has been falling frequently recently. He lives with his wife. He uses walker. CT head did not show show any acute intracranial abnormalities.  PT recommended home health.    Nutrition Problem: Increased nutrient needs Etiology: acute illness      DVT prophylaxis:SCD Code Status: Full Family Communication: None at bedside.  Discussed the plan with the patient Disposition Plan: Home tomorrow Consultants: GI  Procedures: None  Antimicrobials:  Anti-infectives (From admission, onward)   None      Subjective: Patient seen and examined the bedside this morning.  Hemodynamically stable.  Comfortable.  Had a bowel movement this morning which was not black or bloody.  Waiting for EGD  Objective: Vitals:   01/11/19 1240 01/11/19 1250 01/11/19 1300 01/11/19 1318  BP: (!) 91/40 (!) 119/46 (!) 122/50 (!) 142/73  Pulse: 74 73 (!) 35 67  Resp: 19 16 (!) 23 16  Temp:    98.4 F (36.9 C)  TempSrc:    Oral  SpO2: 97% 100% 97% 100%  Weight:      Height:        Intake/Output Summary (Last 24 hours) at 01/11/2019 1345 Last data filed at 01/11/2019 1327 Gross per 24 hour  Intake 504.34 ml  Output 100 ml  Net 404.34 ml   Filed Weights   01/09/19 1558 01/09/19 2108 01/11/19 1132  Weight: 76.2 kg 74.9 kg 74.9 kg    Examination:  General exam: Pleasant elderly gentleman HEENT:PERRL, Ear/Nose normal on gross exam Respiratory system: Bilateral equal  air entry, normal vesicular breath sounds, no wheezes or crackles  Cardiovascular system: S1 & S2 heard, RRR. No JVD, murmurs, rubs, gallops or clicks. No pedal edema. Gastrointestinal system: Abdomen is nondistended, soft and nontender. No organomegaly or masses felt. Normal bowel sounds heard. Central nervous system: Alert and oriented. No focal neurological deficits. Extremities: No edema, no clubbing ,no cyanosis, distal peripheral pulses palpable. Skin: No rashes, lesions or ulcers,no icterus ,no pallor    Data  Reviewed: I have personally reviewed following labs and imaging studies  CBC: Recent Labs  Lab 01/09/19 1113 01/09/19 1713 01/09/19 2104 01/10/19 0801 01/10/19 1243 01/11/19 0421  WBC 5.1 5.3  --  6.0 5.3 4.5  NEUTROABS 4.2  --   --   --   --   --   HGB 7.0 Repeated and verified X2.* 7.4* 6.8* 8.5* 8.3* 7.9*  HCT 21.7 Repeated and verified X2.* 25.3* 22.6* 27.4* 27.0* 26.2*  MCV 87.0 94.4  --  91.6 91.8 92.6  PLT 242.0 245  --  222 [id number]   Basic Metabolic Panel: Recent Labs  Lab 01/09/19 1713  NA 141  K 4.2  CL 109  CO2 26  GLUCOSE 102*  BUN 27*  CREATININE 1.06  CALCIUM 8.6*   GFR: Estimated Creatinine Clearance: 47.2 mL/min (by C-G formula based on SCr of 1.06 mg/dL). Liver Function Tests: Recent Labs  Lab 01/09/19 1713  AST 30  ALT 26  ALKPHOS 72  BILITOT 0.4  PROT 6.6  ALBUMIN 3.4*   No results for input(s): LIPASE, AMYLASE in the last 168 hours. No results for input(s): AMMONIA in the last 168 hours. Coagulation Profile: Recent Labs  Lab 01/10/19 1243  INR 1.2   Cardiac Enzymes: No results for input(s): CKTOTAL, CKMB, CKMBINDEX, TROPONINI in the last 168 hours. BNP (last 3 results) No results for input(s): PROBNP in the last 8760 hours. HbA1C: No results for input(s): HGBA1C in the last 72 hours. CBG: No results for input(s): GLUCAP in the last 168 hours. Lipid Profile: No results for input(s): CHOL, HDL, LDLCALC, TRIG, CHOLHDL, LDLDIRECT in the last 72 hours. Thyroid Function Tests: No results for input(s): TSH, T4TOTAL, FREET4, T3FREE, THYROIDAB in the last 72 hours. Anemia Panel: Recent Labs    01/10/19 0801  VITAMINB12 939*  FOLATE 41.2  RETICCTPCT 3.1   Sepsis Labs: No results for input(s): PROCALCITON, LATICACIDVEN in the last 168 hours.  Recent Results (from the past 240 hour(s))  SARS CORONAVIRUS 2 (TAT 6-24 HRS) Nasopharyngeal Nasopharyngeal Swab     Status: None   Collection Time: 01/09/19  7:01 PM   Specimen:  Nasopharyngeal Swab  Result Value Ref Range Status   SARS Coronavirus 2 NEGATIVE NEGATIVE Final    Comment: (NOTE) SARS-CoV-2 target nucleic acids are NOT DETECTED. The SARS-CoV-2 RNA is generally detectable in upper and lower respiratory specimens during the acute phase of infection. Negative results do not preclude SARS-CoV-2 infection, do not rule out co-infections with other pathogens, and should not be used as the sole basis for treatment or other patient management decisions. Negative results must be combined with clinical observations, patient history, and epidemiological information. The expected result is Negative. Fact Sheet for Patients: SugarRoll.be Fact Sheet for Healthcare Providers: https://www.woods-mathews.com/ This test is not yet approved or cleared by the [place] FDA and  has been authorized for detection and/or diagnosis of SARS-CoV-2 by FDA under an Emergency Use Authorization (EUA). This EUA will remain  in effect (meaning this test can be used)  for the duration of the COVID-19 declaration under Section 56 4(b)(1) of the Act, 21 U.S.C. section 360bbb-3(b)(1), unless the authorization is terminated or revoked sooner. Performed at [facility], [place] [address], [place], [place] [address]          Radiology Studies: Ct Head Wo Contrast  Result Date: 01/09/2019 CLINICAL DATA:  Multiple falls ataxia EXAM: CT HEAD WITHOUT CONTRAST TECHNIQUE: Contiguous axial images were obtained from the base of the skull through the vertex without intravenous contrast. COMPARISON:  CT brain 09/14/2018 FINDINGS: Brain: No acute territorial infarction, hemorrhage or intracranial mass. Atrophy and small vessel ischemic changes of the white matter. Chronic lacunar infarcts in the bilateral basal ganglia and left cerebellum. Stable ventricle size Vascular: No hyperdense vessels. Vertebral and carotid vascular calcification Skull:  Normal. Negative for fracture or focal lesion. Sinuses/Orbits: No acute finding. Other: None IMPRESSION: 1. No CT evidence for acute intracranial abnormality. 2. Atrophy and chronic small vessel ischemic changes of the white matter Electronically Signed   By: Donavan Foil M.D.   On: 01/09/2019 20:24        Scheduled Meds: . atorvastatin  10 mg Oral q1800  . feeding supplement (ENSURE ENLIVE)  237 mL Oral BID BM  . levothyroxine  50 mcg Oral QAC breakfast  . multivitamin with minerals  1 tablet Oral Daily  . pantoprazole (PROTONIX) IV  40 mg Intravenous Q12H   Continuous Infusions: . lactated ringers       LOS: 1 day    Time spent: 25 mins.More than 50% of that time was spent in counseling and/or coordination of care.      Shelly Coss, MD [facility] Pager [phone]  If 7PM-7AM, please contact night-coverage [email or web address] Password [email or web address] 01/11/2019, 1:45 PM

## 2019-01-11 NOTE — Op Note (Addendum)
[facility] Patient Name: [name] Procedure Date: 01/11/2019 MRN: [id number] Attending MD: Estill Cotta. Loletha Carrow , MD Date of Birth: [birth year] CSN: [id number] Age: 85 Admit Type: Outpatient Procedure:                Upper GI endoscopy Indications:              Acute post hemorrhagic anemia (acute drop in Hgb on                            top of chronic multifactorial anemia) Melena                            reported (however, FOBT negative on admission) Providers:                Estill Cotta. Loletha Carrow, MD, Josie Dixon, RN, Ladona Ridgel, Technician Referring MD:              Medicines:                Monitored Anesthesia Care Complications:            No immediate complications. Estimated Blood Loss:     Estimated blood loss: none. Procedure:                Pre-Anesthesia Assessment:                           - Prior to the procedure, a History and Physical                            was performed, and patient medications and                            allergies were reviewed. The patient's tolerance of                            previous anesthesia was also reviewed. The risks                            and benefits of the procedure and the sedation                            options and risks were discussed with the patient.                            All questions were answered, and informed consent                            was obtained. Prior Anticoagulants: The patient has                            taken no previous anticoagulant or antiplatelet  agents except for aspirin. ASA Grade Assessment:                            III - A patient with severe systemic disease. After                            reviewing the risks and benefits, the patient was                            deemed in satisfactory condition to undergo the                            procedure.                           After obtaining informed  consent, the endoscope was                            passed under direct vision. Throughout the                            procedure, the patient's blood pressure, pulse, and                            oxygen saturations were monitored continuously. The                            [id number] Olympus gastroscope was                            introduced through the mouth, and advanced to the                            antrum of the stomach. The upper GI endoscopy was                            extremely difficult due to abnormal anatomy. The                            patient tolerated the procedure well. Scope In: Scope Out: Findings:      The esophagus was normal.      A massive hiatal hernia was present, with about 3/4 of the stomach       intrathoracic.      An examination of the duodenum was not performed because the scope could       not be advanced that far due to looping. Impression:               - Normal esophagus.                           - Large hiatal hernia.                           - No specimens collected.  It cannot be known if patient has duodenal ulcer or                            other cause for recent melena Moderate Sedation:      Not Applicable - Patient had care per Anesthesia. Recommendation:           - Return patient to hospital ward for ongoing care.                            Monitor Hgb/Hct. Patient's last reported melena was                            days ago. If none witnessed by tomorrow and                            hemoglobin stable, discharge home for outpatient                            close follow up.                           - Resume regular diet.                           - No aspirin, ibuprofen, naproxen, or other                            non-steroidal anti-inflammatory drugs. Discontinue                            aspirin.                           - Use Protonix (pantoprazole) 40 mg PO daily for 8                             weeks.                           Wife updated by phone. Procedure Code(s):        --- Professional ---                           [phone], 52, Esophagogastroduodenoscopy, flexible,                            transoral; diagnostic, including collection of                            specimen(s) by brushing or washing, when performed                            (separate procedure) Diagnosis Code(s):        --- Professional ---  K44.9, Diaphragmatic hernia without obstruction or                            gangrene                           D62, Acute posthemorrhagic anemia                           K92.1, Melena (includes Hematochezia) CPT copyright 2019 American [employer]. All rights reserved. The codes documented in this report are preliminary and upon coder review may  be revised to meet current compliance requirements. Henry L. Loletha Carrow, MD 01/11/2019 12:36:53 PM This report has been signed electronically. Number of Addenda: 0

## 2019-01-11 NOTE — Anesthesia Preprocedure Evaluation (Signed)
Anesthesia Evaluation  Patient identified by MRN, date of birth, ID band Patient awake    Reviewed: Allergy & Precautions, NPO status , Patient's Chart, lab work & pertinent test results  Airway Mallampati: II  TM Distance: >3 FB Neck ROM: Full    Dental no notable dental hx.    Pulmonary neg pulmonary ROS,    Pulmonary exam normal breath sounds clear to auscultation       Cardiovascular hypertension, + CAD and + CABG  Normal cardiovascular exam Rhythm:Regular Rate:Normal  Bigeminy noted on previous EKG's   Neuro/Psych negative neurological ROS  negative psych ROS   GI/Hepatic negative GI ROS, Neg liver ROS,   Endo/Other  negative endocrine ROS  Renal/GU negative Renal ROS  negative genitourinary   Musculoskeletal negative musculoskeletal ROS (+)   Abdominal   Peds negative pediatric ROS (+)  Hematology negative hematology ROS (+)   Anesthesia Other Findings   Reproductive/Obstetrics negative OB ROS                             Anesthesia Physical Anesthesia Plan  ASA: III  Anesthesia Plan: MAC   Post-op Pain Management:    Induction: Intravenous  PONV Risk Score and Plan: 0  Airway Management Planned: Simple Face Mask  Additional Equipment:   Intra-op Plan:   Post-operative Plan:   Informed Consent: I have reviewed the patients History and Physical, chart, labs and discussed the procedure including the risks, benefits and alternatives for the proposed anesthesia with the patient or authorized representative who has indicated his/her understanding and acceptance.     Dental advisory given  Plan Discussed with: CRNA and Surgeon  Anesthesia Plan Comments:         Anesthesia Quick Evaluation

## 2019-01-11 NOTE — Plan of Care (Signed)

## 2019-01-11 NOTE — Progress Notes (Signed)
OT Cancellation Note  Patient Details Name: [name]. MRN: [id number] DOB: 07/21/1927   Cancelled Treatment:    Reason Eval/Treat Not Completed: Other (comment). Checked with RN this am, and she was about to get EKG done.  Pt now in procedure. Will either check back later today or tomorrow, as schedule permits  Kamylle Axelson 01/11/2019, 11:51 AM  Lesle Chris, OTR/L Acute Rehabilitation Services [phone] [facility] pager [phone] office 01/11/2019

## 2019-01-11 NOTE — Transfer of Care (Signed)
Immediate Anesthesia Transfer of Care Note  Patient: [name].  Procedure(s) Performed: ESOPHAGOGASTRODUODENOSCOPY (EGD) WITH PROPOFOL (N/A )  Patient Location: PACU  Anesthesia Type:MAC  Level of Consciousness: drowsy  Airway & Oxygen Therapy: Patient Spontanous Breathing and Patient connected to face mask  Post-op Assessment: Report given to RN and Post -op Vital signs reviewed and stable  Post vital signs: Reviewed and stable  Last Vitals:  Vitals Value Taken Time  BP    Temp    Pulse 77 01/11/19 1227  Resp 11 01/11/19 1227  SpO2 99 % 01/11/19 1227  Vitals shown include unvalidated device data.  Last Pain:  Vitals:   01/11/19 1132  TempSrc: Oral  PainSc: 0-No pain      Patients Stated Pain Goal: 2 (84/53/64 [id number])  Complications: No apparent anesthesia complications

## 2019-01-11 NOTE — Anesthesia Postprocedure Evaluation (Signed)
Anesthesia Post Note  Patient: [name].  Procedure(s) Performed: ESOPHAGOGASTRODUODENOSCOPY (EGD) WITH PROPOFOL (N/A )     Patient location during evaluation: PACU Anesthesia Type: MAC Level of consciousness: awake and alert Pain management: pain level controlled Vital Signs Assessment: post-procedure vital signs reviewed and stable Respiratory status: spontaneous breathing, nonlabored ventilation, respiratory function stable and patient connected to nasal cannula oxygen Cardiovascular status: stable and blood pressure returned to baseline Postop Assessment: no apparent nausea or vomiting Anesthetic complications: no    Last Vitals:  Vitals:   01/11/19 1300 01/11/19 1318  BP: (!) 122/50 (!) 142/73  Pulse: (!) 35 67  Resp: (!) 23 16  Temp:  36.9 C  SpO2: 97% 100%    Last Pain:  Vitals:   01/11/19 1318  TempSrc: Oral  PainSc:                  Nava Song S

## 2019-01-11 NOTE — Interval H&P Note (Signed)
History and Physical Interval Note:  01/11/2019 11:54 AM  [name].  has presented today for surgery, with the diagnosis of melena and anemia.  The various methods of treatment have been discussed with the patient and family. After consideration of risks, benefits and other options for treatment, the patient has consented to  Procedure(s): ESOPHAGOGASTRODUODENOSCOPY (EGD) WITH PROPOFOL (N/A) as a surgical intervention.  The patient's history has been reviewed, patient examined, no change in status, stable for surgery.  I have reviewed the patient's chart and labs.  Questions were answered to the patient's satisfaction.     Nelida Meuse III

## 2019-01-12 ENCOUNTER — Encounter (HOSPITAL_COMMUNITY): Payer: Self-pay | Admitting: Gastroenterology

## 2019-01-12 LAB — CBC
HCT: 24.9 % — ABNORMAL LOW (ref 39.0–52.0)
Hemoglobin: 7.5 g/dL — ABNORMAL LOW (ref 13.0–17.0)
MCH: 27.8 pg (ref 26.0–34.0)
MCHC: 30.1 g/dL (ref 30.0–36.0)
MCV: 92.2 fL (ref 80.0–100.0)
Platelets: 206 10*3/uL (ref 150–400)
RBC: 2.7 MIL/uL — ABNORMAL LOW (ref 4.22–5.81)
RDW: 15.5 % (ref 11.5–15.5)
WBC: 6.1 10*3/uL (ref 4.0–10.5)
nRBC: 0 % (ref 0.0–0.2)

## 2019-01-12 LAB — CBC WITH DIFFERENTIAL/PLATELET
Abs Immature Granulocytes: 0.01 10*3/uL (ref 0.00–0.07)
Basophils Absolute: 0 10*3/uL (ref 0.0–0.1)
Basophils Relative: 0 %
Eosinophils Absolute: 0 10*3/uL (ref 0.0–0.5)
Eosinophils Relative: 0 %
HCT: 25.2 % — ABNORMAL LOW (ref 39.0–52.0)
Hemoglobin: 7.7 g/dL — ABNORMAL LOW (ref 13.0–17.0)
Immature Granulocytes: 0 %
Lymphocytes Relative: 8 %
Lymphs Abs: 0.5 10*3/uL — ABNORMAL LOW (ref 0.7–4.0)
MCH: 28 pg (ref 26.0–34.0)
MCHC: 30.6 g/dL (ref 30.0–36.0)
MCV: 91.6 fL (ref 80.0–100.0)
Monocytes Absolute: 0.4 10*3/uL (ref 0.1–1.0)
Monocytes Relative: 7 %
Neutro Abs: 4.9 10*3/uL (ref 1.7–7.7)
Neutrophils Relative %: 85 %
Platelets: 223 10*3/uL (ref 150–400)
RBC: 2.75 MIL/uL — ABNORMAL LOW (ref 4.22–5.81)
RDW: 15.6 % — ABNORMAL HIGH (ref 11.5–15.5)
WBC: 5.8 10*3/uL (ref 4.0–10.5)
nRBC: 0 % (ref 0.0–0.2)

## 2019-01-12 MED ORDER — PANTOPRAZOLE SODIUM 40 MG PO TBEC: 40.0000 mg | DELAYED_RELEASE_TABLET | Freq: Every day | ORAL | 1 refills | Status: DC

## 2019-01-12 MED ORDER — PANTOPRAZOLE SODIUM 40 MG PO TBEC
40.0000 mg | DELAYED_RELEASE_TABLET | Freq: Two times a day (BID) | ORAL | Status: DC
  Administered 2019-01-12: 40 mg via ORAL
  Filled 2019-01-12: qty 1

## 2019-01-12 MED ORDER — FERROUS SULFATE 325 (65 FE) MG PO TABS: 325.0000 mg | ORAL_TABLET | Freq: Every day | ORAL | 1 refills | Status: DC

## 2019-01-12 NOTE — Care Management Important Message (Signed)
Important Message  Patient Details IM Letter given to Rhea Pink SW to present to the Patient Name: [name]. MRN: [id number] Date of Birth: 27-Apr-1927   Medicare Important Message Given:  Yes     Kerin Salen 01/12/2019, 11:13 AM

## 2019-01-12 NOTE — Progress Notes (Signed)
Pt discharged home today per Dr. Tawanna Solo. Pt's IV sites D/C'd and WDL. Pt's VSS. Pt and patient's daughter provided with home medication list, discharge instructions and prescriptions. Verbalized understanding. Pt's daughter will pick up patient at 2:30 pm at main entrance.

## 2019-01-12 NOTE — Plan of Care (Signed)

## 2019-01-12 NOTE — Progress Notes (Signed)
Physical Therapy Treatment Patient Details Name: [name]. MRN: [id number] DOB: 05/20/1927 Today's Date: 01/12/2019    History of Present Illness 85 yo male admitted with symptomatic anemia. Hx of IBS, prostate Ca, CABG, falls    PT Comments    Pt progressing toward PT goals,  Will benefit from HHPT at d/c  Follow Up Recommendations  Home health PT     Equipment Recommendations  None recommended by PT    Recommendations for Other Services       Precautions / Restrictions Precautions Precautions: Fall Restrictions Weight Bearing Restrictions: No    Mobility  Bed Mobility               General bed mobility comments: oob in recliner  Transfers Overall transfer level: Needs assistance Equipment used: 4-wheeled walker Transfers: Sit to/from Stand Sit to Stand: Supervision         General transfer comment: for safety from recliner with armrest  Ambulation/Gait Ambulation/Gait assistance: Supervision Gait Distance (Feet): 320 Feet Assistive device: 4-wheeled walker Gait Pattern/deviations: Step-through pattern;Decreased stride length     General Gait Details: for safety, unsteady initially however no overt LOB,  stability improved with distance    Stairs             Wheelchair Mobility    Modified Rankin (Stroke Patients Only)       Balance             Standing balance-Leahy Scale: Fair                              Cognition Arousal/Alertness: Awake/alert Behavior During Therapy: WFL for tasks assessed/performed Overall Cognitive Status: Within Functional Limits for tasks assessed                                        Exercises      General Comments        Pertinent Vitals/Pain Pain Assessment: No/denies pain    Home Living                      Prior Function            PT Goals (current goals can now be found in the care plan section) Acute Rehab PT Goals Patient  Stated Goal: home soon PT Goal Formulation: With patient Time For Goal Achievement: 01/24/19 Potential to Achieve Goals: Good Progress towards PT goals: Progressing toward goals    Frequency    Min 3X/week      PT Plan Current plan remains appropriate    Co-evaluation              AM-PAC PT "6 Clicks" Mobility   Outcome Measure  Help needed turning from your back to your side while in a flat bed without using bedrails?: A Little Help needed moving from lying on your back to sitting on the side of a flat bed without using bedrails?: A Little Help needed moving to and from a bed to a chair (including a wheelchair)?: A Little Help needed standing up from a chair using your arms (e.g., wheelchair or bedside chair)?: A Little Help needed to walk in hospital room?: A Little Help needed climbing 3-5 steps with a railing? : A Little 6 Click Score: 18    End of Session Equipment Utilized During  Treatment: Gait belt Activity Tolerance: Patient tolerated treatment well Patient left: in chair;with call bell/phone within reach;with chair alarm set   PT Visit Diagnosis: Repeated falls (R29.6);History of falling (Z91.81);Muscle weakness (generalized) (M62.81)     Time: [id number] PT Time Calculation (min) (ACUTE ONLY): 16 min  Charges:  $Gait Training: 8-22 mins                     Kenyon Ana, PT  Pager: [phone] Acute Rehab Dept [facility]): [phone]   01/12/2019    [facility] 01/12/2019, 10:58 AM

## 2019-01-12 NOTE — Discharge Instructions (Signed)
Pantoprazole tablets What is this medicine? PANTOPRAZOLE (pan TOE pra zole) prevents the production of acid in the stomach. It is used to treat gastroesophageal reflux disease (GERD), inflammation of the esophagus, and Zollinger-Ellison syndrome. This medicine may be used for other purposes; ask your health care provider or pharmacist if you have questions. COMMON BRAND NAME(S): Protonix What should I tell my health care provider before I take this medicine? They need to know if you have any of these conditions:  liver disease  low levels of magnesium in the blood  lupus  an unusual or allergic reaction to omeprazole, lansoprazole, pantoprazole, rabeprazole, other medicines, foods, dyes, or preservatives  pregnant or trying to get pregnant  breast-feeding How should I use this medicine? Take this medicine by mouth. Swallow the tablets whole with a drink of water. Follow the directions on the prescription label. Do not crush, break, or chew. Take your medicine at regular intervals. Do not take your medicine more often than directed. Talk to your pediatrician regarding the use of this medicine in children. While this drug may be prescribed for children as young as 5 years for selected conditions, precautions do apply. Overdosage: If you think you have taken too much of this medicine contact a poison control center or emergency room at once. NOTE: This medicine is only for you. Do not share this medicine with others. What if I miss a dose? If you miss a dose, take it as soon as you can. If it is almost time for your next dose, take only that dose. Do not take double or extra doses. What may interact with this medicine? Do not take this medicine with any of the following medications:  atazanavir  nelfinavir This medicine may also interact with the following medications:  ampicillin  delavirdine  erlotinib  iron salts  medicines for fungal infections like ketoconazole,  itraconazole and voriconazole  methotrexate  mycophenolate mofetil  warfarin This list may not describe all possible interactions. Give your health care provider a list of all the medicines, herbs, non-prescription drugs, or dietary supplements you use. Also tell them if you smoke, drink alcohol, or use illegal drugs. Some items may interact with your medicine. What should I watch for while using this medicine? It can take several days before your stomach pain gets better. Check with your doctor or health care provider if your condition does not start to get better, or if it gets worse. This medicine may cause serious skin reactions. They can happen weeks to months after starting the medicine. Contact your health care provider right away if you notice fevers or flu-like symptoms with a rash. The rash may be red or purple and then turn into blisters or peeling of the skin. Or, you might notice a red rash with swelling of the face, lips or lymph nodes in your neck or under your arms. You may need blood work done while you are taking this medicine. This medicine may cause a decrease in vitamin B12. You should make sure that you get enough vitamin B12 while you are taking this medicine. Discuss the foods you eat and the vitamins you take with your health care provider. What side effects may I notice from receiving this medicine? Side effects that you should report to your doctor or health care professional as soon as possible:   allergic reactions like skin rash, itching or hives, swelling of the face, lips, or tongue   bone, muscle or joint pain   breathing problems  chest pain or chest tightness   dark yellow or brown urine   dizziness   fast, irregular heartbeat   feeling faint or lightheaded   fever or sore throat   muscle spasm   palpitations   rash on cheeks or arms that gets worse in the sun   redness, blistering, peeling or loosening of the skin, including  inside the mouth   seizures  stomach polyps   tremors   unusual bleeding or bruising   unusually weak or tired   yellowing of the eyes or skin Side effects that usually do not require medical attention (report to your doctor or health care professional if they continue or are bothersome):   constipation   diarrhea   dry mouth   headache   nausea This list may not describe all possible side effects. Call your doctor for medical advice about side effects. You may report side effects to FDA at 1-800-FDA-1088. Where should I keep my medicine? Keep out of the reach of children. Store at room temperature between 15 and 30 degrees C (59 and 86 degrees F). Protect from light and moisture. Throw away any unused medicine after the expiration date. NOTE: This sheet is a summary. It may not cover all possible information. If you have questions about this medicine, talk to your doctor, pharmacist, or health care provider.  2020 Elsevier/Gold Standard (2018-05-19 13:18:32) Iron tablets, capsules, extended-release tablets What is this medicine? IRON (AHY ern) replaces iron that is essential to healthy red blood cells. Iron is used to treat iron deficiency anemia. Anemia may cause problems like tiredness, shortness of breath, or slowed growth in children. Only take iron if your doctor has told you to. Do not treat yourself with iron if you are feeling tired. Most healthy people get enough iron in their diets, particularly if they eat cereals, meat, poultry, and fish. This medicine may be used for other purposes; ask your health care provider or pharmacist if you have questions. COMMON BRAND NAME(S): Cheri Kearns, [place], EZFE, Feosol, Feosol Complete, [employer], [place], [place], [place], [place], [place] 150, Ferrex-150, Ferrimin, Ferro-Sequels, Journalist, newspaper, Academic librarian, Hemocyte, iFerex 150, Nephro-Fer, Niferex, NovaFerrum, Nu-Iron, Poly-Iron, ProFe, Proferrin ES, Slow Fe, Slow  Iron, Tandem What should I tell my health care provider before I take this medicine? They need to know if you have any of these conditions:  frequently drink alcohol  bowel disease  hemolytic anemia  iron overload (hemochromatosis, hemosiderosis)  liver disease  problems with swallowing  stomach ulcer or other stomach problems  an unusual or allergic reaction to iron, other medicines, foods, dyes, or preservatives  pregnant or trying to get pregnant  breast-feeding How should I use this medicine? Take this medicine by mouth with a glass of water or fruit juice. Follow the directions on the prescription label. Swallow whole. Do not crush or chew. Take this medicine in an upright or sitting position. Try to take any bedtime doses at least 10 minutes before lying down. You may take this medicine with food. Take your medicine at regular intervals. Do not take your medicine more often than directed. Do not stop taking except on your doctor's advice. Talk to your pediatrician regarding the use of this medicine in children. While this drug may be prescribed for selected conditions, precautions do apply. Overdosage: If you think you have taken too much of this medicine contact a poison control center or emergency room at once. NOTE: This medicine is only for you. Do not share  this medicine with others. What if I miss a dose? If you miss a dose, take it as soon as you can. If it is almost time for your next dose, take only that dose. Do not take double or extra doses. What may interact with this medicine? If you are taking this iron product, you should not take iron in any other medicine or dietary supplement. This medicine may also interact with the following medications:  alendronate  antacids  cefdinir  chloramphenicol  cholestyramine  deferoxamine  dimercaprol  etidronate  medicines for stomach ulcers or other stomach problems  pancreatic enzymes  quinolone  antibiotics (examples: Cipro, Floxin, Levaquin, Tequin and others)  risedronate  tetracycline antibiotics (examples: doxycycline, tetracycline, minocycline, and others)  thyroid hormones This list may not describe all possible interactions. Give your health care provider a list of all the medicines, herbs, non-prescription drugs, or dietary supplements you use. Also tell them if you smoke, drink alcohol, or use illegal drugs. Some items may interact with your medicine. What should I watch for while using this medicine? Use iron supplements only as directed by your health care professional. Dennis Bast will need important blood work while you are taking this medicine. It may take 3 to 6 months of therapy to treat low iron levels. Pregnant women should follow the dose and length of iron treatment as directed by their doctors. Do not use iron longer than prescribed, and do not take a higher dose than recommended. Long-term use may cause excess iron to build-up in the body. Do not take iron with antacids. If you need to take an antacid, take it 2 hours after a dose of iron. What side effects may I notice from receiving this medicine? Side effects that you should report to your doctor or health care professional as soon as possible:  allergic reactions like skin rash, itching or hives, swelling of the face, lips, or tongue  blue lips, nails, or palms  dark colored stools (this may be due to the iron, but can indicate a more serious condition)  drowsiness  pain with or difficulty swallowing  pale or clammy skin  seizures  stomach pain  unusually weak or tired  vomiting  weak, fast, or irregular heartbeat Side effects that usually do not require medical attention (report to your doctor or health care professional if they continue or are bothersome):  constipation  indigestion  nausea or stomach upset This list may not describe all possible side effects. Call your doctor for medical advice  about side effects. You may report side effects to FDA at 1-800-FDA-1088. Where should I keep my medicine? Keep out of the reach of children. Even small amounts of iron can be harmful to a child. Store at room temperature between 15 and 30 degrees C (59 and 86 degrees F). Keep container tightly closed. Throw away any unused medicine after the expiration date. NOTE: This sheet is a summary. It may not cover all possible information. If you have questions about this medicine, talk to your doctor, pharmacist, or health care provider.  2020 Elsevier/Gold Standard (2007-06-27 17:03:41)

## 2019-01-12 NOTE — Evaluation (Addendum)
Occupational Therapy Evaluation Patient Details Name: [name]. MRN: [id number] DOB: 1927/04/09 Today's Date: 01/12/2019    History of Present Illness 85 yo male admitted with symptomatic anemia. Hx of IBS, prostate Ca, CABG, falls   Clinical Impression   Pt was admitted for the above. 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  He hopes to be able to return to this role in the future. Recommend HHOT for IADLs.  He states that he doesn't want to use [facility], which is in-house.     Follow Up Recommendations  Home health OT(pt prefers not to use [facility]; wants to get back to being CG)    Equipment Recommendations  None recommended by OT    Recommendations for Other Services       Precautions / Restrictions Precautions Precautions: Fall Restrictions Weight Bearing Restrictions: No      Mobility Bed Mobility               General bed mobility comments: oob  Transfers Overall transfer level: Needs assistance Equipment used: 4-wheeled walker Transfers: Sit to/from Stand Sit to Stand: Supervision         General transfer comment: good safety awareness    Balance             Standing balance-Leahy Scale: Fair                             ADL either performed or assessed with clinical judgement   ADL Overall ADL's : Needs assistance/impaired                                       General ADL Comments: set up/supervision to gather clothes; extra time     Vision         Perception     Praxis      Pertinent Vitals/Pain Pain Assessment: Faces Faces Pain Scale: Hurts a little bit Pain Location: back Pain Descriptors / Indicators: Sore Pain Intervention(s): Limited activity within patient's tolerance;Monitored during session;Repositioned     Hand Dominance     Extremity/Trunk Assessment Upper Extremity Assessment Upper  Extremity Assessment: Generalized weakness           Communication Communication Communication: HOH   Cognition Arousal/Alertness: Awake/alert Behavior During Therapy: WFL for tasks assessed/performed Overall Cognitive Status: Within Functional Limits for tasks assessed                                     General Comments       Exercises     Shoulder Instructions      Home Living Family/patient expects to be discharged to:: Private residence Living Arrangements: Spouse/significant other   Type of Home: Independent living facility             Bathroom Shower/Tub: Tub/shower unit   Bathroom Toilet: Handicapped height     Home Equipment: Environmental consultant - 4 wheels   Additional Comments: 3 grab bars in the shower      Prior Functioning/Environment Level of Independence: Independent with assistive device(s)        Comments: 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  Daughter is working on getting hellp.  He  pllllans to return to I living        OT Problem List: Decreased strength;Decreased activity tolerance;Impaired balance (sitting and/or standing);Decreased knowledge of use of DME or AE      OT Treatment/Interventions:      OT Goals(Current goals can be found in the care plan section) Acute Rehab OT Goals Patient Stated Goal: home soon OT Goal Formulation: With patient  OT Frequency:     Barriers to D/C:            Co-evaluation              AM-PAC OT "6 Clicks" Daily Activity     Outcome Measure Help from another person eating meals?: None Help from another person taking care of personal grooming?: A Little Help from another person toileting, which includes using toliet, bedpan, or urinal?: A Little Help from another person bathing (including washing, rinsing, drying)?: A Little Help from another person to put on and taking off regular upper body clothing?: A Little Help from another person to put on and taking off regular lower body clothing?: A  Little 6 Click Score: 19   End of Session    Activity Tolerance: Patient tolerated treatment well Patient left: in chair;with call bell/phone within reach;with chair alarm set  OT Visit Diagnosis: Muscle weakness (generalized) (M62.81)                Time: [id number] OT Time Calculation (min): 38 min Charges:  OT General Charges $OT Visit: 1 Visit OT Evaluation $OT Eval Low Complexity: 1 Low  And 1 TA  Lesle Chris, OTR/L Acute Rehabilitation Services [phone] [facility] pager [phone] office 01/12/2019  [place] 01/12/2019, 1:56 PM

## 2019-01-12 NOTE — Discharge Summary (Signed)
Physician Discharge Summary  [name]. [id number] DOB: 25-Feb-1927 DOA: 01/09/2019  PCP: [name], MD  Admit date: 01/09/2019 Discharge date: 01/12/2019  Admitted From: Home Disposition:  Home  Discharge Condition:Stable CODE STATUS:FULL Diet recommendation: Heart Healthy   Brief/Interim Summary:  Patient is 85 year old male with history of chronic polyps, diverticulosis, coronary disease status post CABG x2, history of mitral valve repair, hypertension, proximal A. fib, history of prostate cancer status post TURP/radiation therapy who was sent by his PCP for the evaluation of anemia.  Patient was complaining of black-colored stool for last several days.  He is on aspirin 81 mg daily at home.  He was also recently diagnosed with diverticular bleed and was prescribed 10 days course of Cipro and Flagyl.  Hemoglobin of 7.4 on presentation.  FOBT was negative.  He was transfused with 1 unit of PRBC for symptomatic anemia.  GI consulted .upper endoscopy did not show any active bleeding or ulcers.    He is not bleeding anymore.  He has been started on Protonix and iron supplementation.  He is hemodynamically stable for discharge home today.  We have recommended to stop aspirin permanently.  Following problems were addressed during his hospitalization:  Symptomatic acute blood loss anemia: Concern for upper GI bleed.  Presented with anemia.  Transfused with a unit of  PRBC.  Currently hemodynamically stable. Reported of having Melena at home.  He was on  aspirin 81 mg daily.  Currently no active GI bleed.  Last colonoscopy done in 2012 showed 2 polyps in the ascending colon.  Also has history of diverticulosis. GI  consulted.   EGD did not show any active bleeding or ulcers.  Showed large hiatal hernia.  Hb this morning is stable. GI recommended to discontinue aspirin permanently.  Continue Protonix.  Also started on iron supplementation.  Coronary artery disease: Status post  CABG.  On aspirin at home.  We will discontinue aspirin on discharge.  Hypertension: Currently blood pressure stable.  Resume home meds.  Hypothyroidism: Continue Synthyroid.  Frequent falls: Patient reports that she has been falling frequently recently. He lives with his wife. He uses walker. CT head did not show show any acute intracranial abnormalities.  PT recommended home health.    Discharge Diagnoses:  Principal Problem:   Symptomatic anemia Active Problems:   Hypothyroidism   Essential hypertension   CAD (coronary artery disease)   Fall at home, initial encounter   Melena   Acute blood loss anemia   GI bleed    Discharge Instructions  Discharge Instructions    Diet - low sodium heart healthy   Complete by: As directed    Discharge instructions   Complete by: As directed    1)Please take prescribed medication as instructed. 2)Follow up with your PCP in a week.  Do a CBC test to check your hemoglobin during the follow-up. 3)Follow up with your cardiologist as an outpatient.We recommend to stop taking aspirin.   Increase activity slowly   Complete by: As directed      Allergies as of 01/12/2019      Reactions   Other Other (See Comments)   ANESTHESIA.... 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 ANESTHESIA.... 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 Occurred post op CABG 2015, required readmission and rehab      Medication List    STOP taking these medications   aspirin EC 81 MG tablet   ciprofloxacin 500 MG tablet Commonly known  as: Cipro   diclofenac sodium 1 % Gel Commonly known as: Voltaren   lidocaine 5 % Commonly known as: Lidoderm   meclizine 25 MG tablet Commonly known as: ANTIVERT   metroNIDAZOLE 500 MG tablet Commonly known as: FLAGYL   triamcinolone cream 0.1 % Commonly known as: KENALOG     TAKE these medications   amoxicillin 500 MG capsule Commonly known as: AMOXIL Take 2,000 mg by mouth as  directed. Prior to dental appointment   atorvastatin 10 MG tablet Commonly known as: LIPITOR TAKE 1 TABLET BY MOUTH EVERY DAY AT 6:00 PM What changed:   how much to take  how to take this  when to take this  additional instructions   diphenoxylate-atropine 2.5-0.025 MG tablet Commonly known as: LOMOTIL TAKE 1 TABLET BY MOUTH 4 TIMES DAILY AS NEEDED FOR  DIARRHEA  OR  LOOSE  STOOLS  (AT  [place]) What changed: See the new instructions.   ferrous sulfate 325 (65 FE) MG tablet Take 1 tablet (325 mg total) by mouth daily.   fish oil-omega-3 fatty acids 1000 MG capsule Take 1 g by mouth every morning.   Leuprolide Acetate (6 Month) 45 MG injection Commonly known as: LUPRON Inject 45 mg into the muscle every 3 (three) months.   levothyroxine 50 MCG tablet Commonly known as: SYNTHROID Take 1 tablet (50 mcg total) by mouth every morning.   metoprolol tartrate 25 MG tablet Commonly known as: LOPRESSOR TAKE ONE TABLET BY MOUTH TWICE A DAY   nitroGLYCERIN 0.4 MG SL tablet Commonly known as: NITROSTAT Place 1 tablet (0.4 mg total) under the tongue every 5 (five) minutes as needed for chest pain.   pantoprazole 40 MG tablet Commonly known as: PROTONIX Take 1 tablet (40 mg total) by mouth daily.   vitamin C 500 MG tablet Commonly known as: ASCORBIC ACID Take 500 mg by mouth every morning.   VITAMIN D3 PO Take 2,000 Units by mouth daily.      Follow-up Information    [name], MD. Schedule an appointment as soon as possible for a visit in 1 week(s).   Specialty: Family Medicine Contact information: [address] [place] [address] [phone]          Allergies  Allergen Reactions  . Other Other (See Comments)    ANESTHESIA.... 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 ANESTHESIA.... 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 Occurred post op CABG 2015, required readmission and rehab     Consultations:  GI   Procedures/Studies: Ct Abdomen Pelvis Wo Contrast  Result Date: 12/14/2018 CLINICAL DATA:  Locally advanced prostate cancer restaging. Androgen deprivation therapy. Prior radiation therapy completed in October 2018. EXAM: CT ABDOMEN AND PELVIS WITHOUT CONTRAST TECHNIQUE: Multidetector CT imaging of the abdomen and pelvis was performed following the standard protocol without IV contrast. COMPARISON:  10/18/2016 FINDINGS: Lower chest: Mild cardiomegaly. Prosthetic mitral valve. Right coronary artery atherosclerosis descending thoracic aortic atherosclerosis. A large hiatal hernia contains almost all of the stomach. Hepatobiliary: Gallstones are present in the gallbladder. Gallbladder wall calcification is specially involving the fundus, porcelain gallbladder not excluded. Pancreas: Unremarkable Spleen: Unremarkable Adrenals/Urinary Tract: Left double-J ureteral stent. Mild left renal atrophy. Fullness of the left collecting system with stranding around the collecting system and left ureter. No urinary tract calculi are identified. Stomach/Bowel: The stomach is primarily intrathoracic. Appendix unremarkable. Staple line noted at the anorectal junction. Sigmoid colon diverticulosis. Vascular/Lymphatic: Aortoiliac atherosclerotic vascular disease.  Reproductive: Prior transurethral resection of the prostate. Residual prostate is indistinctly marginated, measuring about 4.8 by 3.6 cm on image 77/2, and by my measurement about 5.0 by 4.4 cm on 10/18/2017. Significant reduction in the amount of left distal periureteral tumor. Left perirectal soft tissue density 4.6 by 1.3 cm, formerly 5.3 by 2.4 cm by my measurements. Presacral edema. Edema tracks along pelvic fascia planes. Other: No supplemental non-categorized findings. Musculoskeletal: New vertical band of sclerosis in the left iliac wing compared to 10/18/2017, interval stress fracture not excluded. Mildly exaggerated lumbar lordosis.  No specific osseous metastatic lesions are identified. IMPRESSION: 1. Significant reduction in the amount of left distal periureteral tumor compared to 10/18/2017, with reduced tumor along the prostate bed and in the left perirectal region. 2. New vertical band of sclerosis in the left iliac wing compared to 10/18/2017, interval stress fracture not excluded. 3. There is either a very large gallstone in the gallbladder fundus, or more likely wall calcification a portion of the gallbladder wall and fundus, versus less likely a rim calcified hepatic cyst along the gallbladder fundus. Porcelain gallbladder not excluded. Porcelain gallbladder can be associated with an increased risk of malignancy. Surveillance or cholecystectomy suggested. 4. Other imaging findings of potential clinical significance: Coronary atherosclerosis. Large hiatal hernia contains almost all of the stomach. Cholelithiasis. Sigmoid colon diverticulosis. Left double-J ureteral stent satisfactorily position. Aortic Atherosclerosis (ICD10-I70.0). Electronically Signed   By: Van Clines M.D.   On: 12/14/2018 13:11   Ct Head Wo Contrast  Result Date: 01/09/2019 CLINICAL DATA:  Multiple falls ataxia EXAM: CT HEAD WITHOUT CONTRAST TECHNIQUE: Contiguous axial images were obtained from the base of the skull through the vertex without intravenous contrast. COMPARISON:  CT brain 09/14/2018 FINDINGS: Brain: No acute territorial infarction, hemorrhage or intracranial mass. Atrophy and small vessel ischemic changes of the white matter. Chronic lacunar infarcts in the bilateral basal ganglia and left cerebellum. Stable ventricle size Vascular: No hyperdense vessels. Vertebral and carotid vascular calcification Skull: Normal. Negative for fracture or focal lesion. Sinuses/Orbits: No acute finding. Other: None IMPRESSION: 1. No CT evidence for acute intracranial abnormality. 2. Atrophy and chronic small vessel ischemic changes of the white matter  Electronically Signed   By: Donavan Foil M.D.   On: 01/09/2019 20:24       Subjective: Patient seen and examined the bedside this morning.  Hemodynamically stable for discharge.  Discharge Exam: Vitals:   01/11/19 2202 01/12/19 0640  BP: (!) 105/47 (!) 130/55  Pulse: 87 62  Resp: 16 18  Temp: 98.1 F (36.7 C) 98.3 F (36.8 C)  SpO2: 100% 98%   Vitals:   01/11/19 1300 01/11/19 1318 01/11/19 2202 01/12/19 0640  BP: (!) 122/50 (!) 142/73 (!) 105/47 (!) 130/55  Pulse: (!) 35 67 87 62  Resp: (!) 23 16 16 18   Temp:  98.4 F (36.9 C) 98.1 F (36.7 C) 98.3 F (36.8 C)  TempSrc:  Oral Oral Oral  SpO2: 97% 100% 100% 98%  Weight:      Height:        General: Pt is alert, awake, not in acute distress Cardiovascular: RRR, S1/S2 +, no rubs, no gallops Respiratory: CTA bilaterally, no wheezing, no rhonchi Abdominal: Soft, NT, ND, bowel sounds + Extremities: no edema, no cyanosis    The results of significant diagnostics from this hospitalization (including imaging, microbiology, ancillary and laboratory) are listed below for reference.     Microbiology: Recent Results (from the past 240 hour(s))  SARS CORONAVIRUS 2 (  TAT 6-24 HRS) Nasopharyngeal Nasopharyngeal Swab     Status: None   Collection Time: 01/09/19  7:01 PM   Specimen: Nasopharyngeal Swab  Result Value Ref Range Status   SARS Coronavirus 2 NEGATIVE NEGATIVE Final    Comment: (NOTE) SARS-CoV-2 target nucleic acids are NOT DETECTED. The SARS-CoV-2 RNA is generally detectable in upper and lower respiratory specimens during the acute phase of infection. Negative results do not preclude SARS-CoV-2 infection, do not rule out co-infections with other pathogens, and should not be used as the sole basis for treatment or other patient management decisions. Negative results must be combined with clinical observations, patient history, and epidemiological information. The expected result is Negative. Fact Sheet for  Patients: SugarRoll.be Fact Sheet for Healthcare Providers: https://www.woods-mathews.com/ This test is not yet approved or cleared by the [place] FDA and  has been authorized for detection and/or diagnosis of SARS-CoV-2 by FDA under an Emergency Use Authorization (EUA). This EUA will remain  in effect (meaning this test can be used) for the duration of the COVID-19 declaration under Section 56 4(b)(1) of the Act, 21 U.S.C. section 360bbb-3(b)(1), unless the authorization is terminated or revoked sooner. Performed at [facility], [place] [address], [place], [place] [address]      Labs: BNP (last 3 results) No results for input(s): BNP in the last 8760 hours. Basic Metabolic Panel: Recent Labs  Lab 01/09/19 1713  NA 141  K 4.2  CL 109  CO2 26  GLUCOSE 102*  BUN 27*  CREATININE 1.06  CALCIUM 8.6*   Liver Function Tests: Recent Labs  Lab 01/09/19 1713  AST 30  ALT 26  ALKPHOS 72  BILITOT 0.4  PROT 6.6  ALBUMIN 3.4*   No results for input(s): LIPASE, AMYLASE in the last 168 hours. No results for input(s): AMMONIA in the last 168 hours. CBC: Recent Labs  Lab 01/09/19 1113  01/10/19 0801 01/10/19 1243 01/11/19 0421 01/12/19 0358 01/12/19 0523  WBC 5.1   < > 6.0 5.3 4.5 5.8 6.1  NEUTROABS 4.2  --   --   --   --  4.9  --   HGB 7.0 Repeated and verified X2.*   < > 8.5* 8.3* 7.9* 7.7* 7.5*  HCT 21.7 Repeated and verified X2.*   < > 27.4* 27.0* 26.2* 25.2* 24.9*  MCV 87.0   < > 91.6 91.8 92.6 91.6 92.2  PLT 242.0   < > 222 247 196 223 206   < > = values in this interval not displayed.   Cardiac Enzymes: No results for input(s): CKTOTAL, CKMB, CKMBINDEX, TROPONINI in the last 168 hours. BNP: Invalid input(s): POCBNP CBG: No results for input(s): GLUCAP in the last 168 hours. D-Dimer No results for input(s): DDIMER in the last 72 hours. Hgb A1c No results for input(s): HGBA1C in the last 72 hours. Lipid  Profile No results for input(s): CHOL, HDL, LDLCALC, TRIG, CHOLHDL, LDLDIRECT in the last 72 hours. Thyroid function studies No results for input(s): TSH, T4TOTAL, T3FREE, THYROIDAB in the last 72 hours.  Invalid input(s): FREET3 Anemia work up Recent Labs    01/10/19 0801  VITAMINB12 939*  FOLATE 41.2  RETICCTPCT 3.1   Urinalysis    Component Value Date/Time   COLORURINE YELLOW 01/20/2016 [place] 01/20/2016 1556   LABSPEC 1.025 01/20/2016 1556   PHURINE 5.5 01/20/2016 1556   GLUCOSEU NEGATIVE 01/20/2016 [place] 01/20/2016 1556   HGBUR negative 08/02/2007 0849  BILIRUBINUR 1+  1mg /dL 09/20/2017 1203   KETONESUR NEGATIVE 01/20/2016 1556   PROTEINUR Positive (A) 09/20/2017 1203   PROTEINUR NEGATIVE 05/03/2014 1020   UROBILINOGEN 1.0 09/20/2017 1203   UROBILINOGEN 0.2 01/20/2016 1556   NITRITE positive 09/20/2017 1203   NITRITE NEGATIVE 01/20/2016 1556   LEUKOCYTESUR Large (3+) (A) 09/20/2017 1203   Sepsis Labs Invalid input(s): PROCALCITONIN,  WBC,  LACTICIDVEN Microbiology Recent Results (from the past 240 hour(s))  SARS CORONAVIRUS 2 (TAT 6-24 HRS) Nasopharyngeal Nasopharyngeal Swab     Status: None   Collection Time: 01/09/19  7:01 PM   Specimen: Nasopharyngeal Swab  Result Value Ref Range Status   SARS Coronavirus 2 NEGATIVE NEGATIVE Final    Comment: (NOTE) SARS-CoV-2 target nucleic acids are NOT DETECTED. The SARS-CoV-2 RNA is generally detectable in upper and lower respiratory specimens during the acute phase of infection. Negative results do not preclude SARS-CoV-2 infection, do not rule out co-infections with other pathogens, and should not be used as the sole basis for treatment or other patient management decisions. Negative results must be combined with clinical observations, patient history, and epidemiological information. The expected result is Negative. Fact Sheet for  Patients: SugarRoll.be Fact Sheet for Healthcare Providers: https://www.woods-mathews.com/ This test is not yet approved or cleared by the [place] FDA and  has been authorized for detection and/or diagnosis of SARS-CoV-2 by FDA under an Emergency Use Authorization (EUA). This EUA will remain  in effect (meaning this test can be used) for the duration of the COVID-19 declaration under Section 56 4(b)(1) of the Act, 21 U.S.C. section 360bbb-3(b)(1), unless the authorization is terminated or revoked sooner. Performed at [facility], [place] [address], [place], [place] [address]     Please note: You were cared for by a hospitalist during your hospital stay. Once you are discharged, your primary care physician will handle any further medical issues. Please note that NO REFILLS for any discharge medications will be authorized once you are discharged, as it is imperative that you return to your primary care physician (or establish a relationship with a primary care physician if you do not have one) for your post hospital discharge needs so that they can reassess your need for medications and monitor your lab values.    Time coordinating discharge: 40 minutes  SIGNED:   Shelly Coss, MD  [facility] 01/12/2019, 10:17 AM Pager [phone]  If 7PM-7AM, please contact night-coverage [email or web address] Password [email or web address]

## 2019-01-12 NOTE — TOC Transition Note (Signed)
Transition of Care [facility]) - CM/SW Discharge Note   Patient Details  Name: [name]. MRN: [id number] Date of Birth: 1927-04-12  [facility]) CM/SW Contact:  Wende Neighbors, LCSW Phone Number: 01/12/2019, 12:13 PM   Clinical Narrative:   CSW met patient at bedside to make him aware that patient is now being followed by [facility] for physical therapy. CSW spoke with patients daughter via phone to make her aware of patients discharge plans. Daughter aware that [facility] has been set up for patient. Daughter stated that patients neighbor will pick patient up once patient is dressed and ready. RN to call daughter to go over AVS          Patient Goals and CMS Choice        Discharge Placement                       Discharge Plan and Services                                     Social Determinants of Health (SDOH) Interventions     Readmission Risk Interventions No flowsheet data found.

## 2019-01-14 DIAGNOSIS — I119 Hypertensive heart disease without heart failure: Secondary | ICD-10-CM | POA: Diagnosis not present

## 2019-01-14 DIAGNOSIS — E119 Type 2 diabetes mellitus without complications: Secondary | ICD-10-CM | POA: Diagnosis not present

## 2019-01-14 DIAGNOSIS — M4056 Lordosis, unspecified, lumbar region: Secondary | ICD-10-CM | POA: Diagnosis not present

## 2019-01-14 DIAGNOSIS — I480 Paroxysmal atrial fibrillation: Secondary | ICD-10-CM | POA: Diagnosis not present

## 2019-01-14 DIAGNOSIS — I2510 Atherosclerotic heart disease of native coronary artery without angina pectoris: Secondary | ICD-10-CM | POA: Diagnosis not present

## 2019-01-14 DIAGNOSIS — G319 Degenerative disease of nervous system, unspecified: Secondary | ICD-10-CM | POA: Diagnosis not present

## 2019-01-14 DIAGNOSIS — R69 Illness, unspecified: Secondary | ICD-10-CM | POA: Diagnosis not present

## 2019-01-14 DIAGNOSIS — K5730 Diverticulosis of large intestine without perforation or abscess without bleeding: Secondary | ICD-10-CM | POA: Diagnosis not present

## 2019-01-14 DIAGNOSIS — D62 Acute posthemorrhagic anemia: Secondary | ICD-10-CM | POA: Diagnosis not present

## 2019-01-15 ENCOUNTER — Telehealth: Payer: Self-pay | Admitting: Family Medicine

## 2019-01-15 NOTE — Telephone Encounter (Signed)
Okay to give verba orders

## 2019-01-15 NOTE — Telephone Encounter (Signed)
Copied from [place] [id number]. Topic: General - Other >> Jan 15, 2019  9:21 AM Keene Breath wrote: Reason for CRM: Called to get verbal orders for home health PT - 1wk5.  CB# [phone]

## 2019-01-15 NOTE — Telephone Encounter (Signed)
Please okay the orders  

## 2019-01-16 ENCOUNTER — Encounter: Payer: Self-pay | Admitting: *Deleted

## 2019-01-16 ENCOUNTER — Other Ambulatory Visit: Payer: Self-pay | Admitting: *Deleted

## 2019-01-16 NOTE — Patient Outreach (Signed)
  [facility]) Care Management  01/16/2019  [name]. [birth year] [id number]   EMMI-general discharge On APL RED ON EMMI ALERT Day # 1 Date: Sunday January 15 2019 Red Alert Reason: Scheduled follow-up? No   Insurance: aetna medicare  [facility] admissions x 1 ED visits x 1 in the last 6 months  Last admission Admit date: 01/09/2019 Discharge date: 01/12/2019  Outreach attempt #1  He is [facility] Gave permission for [facility] RN CM to speak with his wife, [name] who is able to verify HIPAA, DOB and address [facility] Care Management RN reviewed and addressed red alert with patient and wife   EMMI:  [name] confirms [name] has a scheduled follow up appointment with his primary care MD that was scheduled before the [facility] Sunday January 15 2019 call  He is scheduled to see [name] on 02/02/19 for his hospital follow up and his annual health screening to prevent 2 separate appointments as their daughter prefer to drive from [place] to transport him.  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 They have the support of their daughter, [name], who lives in [place] for transportation to medical appointments.  They have 3 children Luis [name] is being seen by [facility] home health staff for [facility] PT. He is needing assist with his ADLs and iADLs  He is HOH Luis and [name] report they are on quarantine at the facility at this time for 14 days and it will end prior to his scheduled f/u primary care MD appointment  Conditions: HTN, PAF, unstable angina, presyncope, CAD, PVCs, HOH, bilateral pleural effusion, GERD, benign colon cancer, diverticulosis of colon, melena hx, GI bleed, HLD,  s/p CABG, s/p MVR, arthritis, delirium, osteopenia, left ureteral stricture, anemia, fall hx s/p MAZE operation for atrial fibrillation, never smoker   DME: walker  Medications: He denies concerns with taking medications as prescribed, affording medications, side effects of medications and questions about medications    Appointments: 02/02/19 1000 [name], primary care MD  06/20/18 labs and visit with [name] oncology  Advance Directives: He hs a living will and HPOA his daughter    Consent: [facility] RN CM reviewed [facility] services with patient. Patient gave verbal consent for services [facility] telephonic RN CM.   Advised patient that there will be further automated EMMI- post discharge calls to assess how the patient is doing following the recent hospitalization Advised the patient that another call may be received from a nurse if any of their responses were abnormal. Patient voiced understanding and was appreciative of f/u call.   Plan: [facility] RN CM will close case at this time as patient has been assessed and no needs identified/needs resolved.   Pt encouraged to return a call to [place] CM prn  [facility] RN CM sent a successful outreach letter as discussed with [facility] brochure enclosed for review   L. Lavina Hamman, RN, BSN, [place] Coordinator Office number [phone] Mobile number [phone]  Main [facility] number [phone] Fax number [phone]

## 2019-01-16 NOTE — Telephone Encounter (Signed)
Called with Joe. No answer. LVM with okay for verbal orders.

## 2019-01-16 NOTE — Telephone Encounter (Signed)
Noted  

## 2019-01-22 ENCOUNTER — Other Ambulatory Visit: Payer: Self-pay | Admitting: *Deleted

## 2019-01-22 ENCOUNTER — Encounter: Payer: Self-pay | Admitting: *Deleted

## 2019-01-22 NOTE — Patient Outreach (Signed)
[name]) Care Management  01/22/2019  [name]. 06/07/1927 [id number]   EMMI-general discharge On APL RED ON EMMI ALERT Day # 4 Date: Wednesday January 17 2019 151 Patient Red Alert Reason: Scheduled follow-up? No   Insurance: aetna medicare  [facility] admissions x 1 ED visits x 1 in the last 6 months  Last admission Admit date:01/09/2019 Discharge date:01/12/2019   Outreach attempt #2 [name] is [facility] He also gave permission for [facility] RN CM to speak with his wife, [name] who is able to verify HIPAA, DOB and address  [name] and [name] deny any changes, medical concerns since the last 01/16/19 contact with them They both voiced appreciation for the f/u call to them   EMMI:  [name] confirms [name] has a scheduled follow up appointment with his primary care MD that was scheduled before the [facility] Sunday January 15 2019 call  He is scheduled to see [name] on 02/02/19 for his hospital follow up and his annual health screening to prevent 2 separate appointments as their daughter prefer to drive from [place] to transport him.  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 They have the support of their daughter, [name], who lives in [place] for transportation to medical appointments.  They have 3 children Luis Winsor is being seen by [facility] home health staff for [facility]. He is needing assist with his ADLs and iADLs  He is HOH Luis and Luis Oborny report they are on quarantine at the facility at this time for 14 days and it will end prior to his scheduled f/u primary care MD appointment  Conditions: HTN, PAF, unstable angina, presyncope, CAD, PVCs,  HOH, bilateral pleural effusion, GERD, benign colon cancer, diverticulosis of colon, melena hx, GI bleed, HLD, s/p CABG, s/p MVR, arthritis, delirium, osteopenia, left ureteral stricture, anemia, fall hx s/p MAZE operation for atrial fibrillation, never smoker   DME: walker  Medications: He denies concerns with taking medications as prescribed, affording medications, side effects of medications and questions about medications    Appointments: 02/02/19 1000 [name], primary care MD  06/20/18 labs and visit with [name] oncology  Advance Directives: He hs a living will and HPOA his daughter    Consent: [facility] RN CM reviewed [facility] services with patient. Patient gave verbal consent for services [facility] telephonic RN CM.   Advised patient that there will be further automated EMMI-post discharge calls to assess how the patient is doing following the recent hospitalization Advised the patient that another call may be received from a nurse if any of their responses were abnormal. Patient voiced understanding and was appreciative of f/u call.   Plan: [facility] RN CM will close case at this time as patient has been assessed and no needs identified/needs resolved.   Pt encouraged to return a call to [place] CM prn  [facility] RN CM sent a successful outreach letter as discussed with [facility] brochure enclosed for review  Namiyah Grantham L. Lavina Hamman, RN, BSN, [place] Coordinator Office number [phone] Mobile number [phone]  Main [facility] number [phone] Fax number  [phone]      

## 2019-01-26 DIAGNOSIS — E119 Type 2 diabetes mellitus without complications: Secondary | ICD-10-CM | POA: Diagnosis not present

## 2019-01-26 DIAGNOSIS — I2510 Atherosclerotic heart disease of native coronary artery without angina pectoris: Secondary | ICD-10-CM | POA: Diagnosis not present

## 2019-01-26 DIAGNOSIS — I480 Paroxysmal atrial fibrillation: Secondary | ICD-10-CM | POA: Diagnosis not present

## 2019-01-26 DIAGNOSIS — R69 Illness, unspecified: Secondary | ICD-10-CM | POA: Diagnosis not present

## 2019-01-26 DIAGNOSIS — I119 Hypertensive heart disease without heart failure: Secondary | ICD-10-CM | POA: Diagnosis not present

## 2019-01-26 DIAGNOSIS — D62 Acute posthemorrhagic anemia: Secondary | ICD-10-CM | POA: Diagnosis not present

## 2019-01-26 DIAGNOSIS — G319 Degenerative disease of nervous system, unspecified: Secondary | ICD-10-CM | POA: Diagnosis not present

## 2019-01-26 DIAGNOSIS — K5730 Diverticulosis of large intestine without perforation or abscess without bleeding: Secondary | ICD-10-CM | POA: Diagnosis not present

## 2019-01-26 DIAGNOSIS — M4056 Lordosis, unspecified, lumbar region: Secondary | ICD-10-CM | POA: Diagnosis not present

## 2019-01-29 ENCOUNTER — Other Ambulatory Visit: Payer: Self-pay

## 2019-01-29 ENCOUNTER — Encounter: Payer: Self-pay | Admitting: Family Medicine

## 2019-01-29 ENCOUNTER — Ambulatory Visit (INDEPENDENT_AMBULATORY_CARE_PROVIDER_SITE_OTHER): Payer: Medicare HMO | Admitting: Family Medicine

## 2019-01-29 VITALS — BP 138/80 | HR 41 | Temp 97.3°F | Ht 69.0 in | Wt 165.0 lb

## 2019-01-29 DIAGNOSIS — D6489 Other specified anemias: Secondary | ICD-10-CM | POA: Diagnosis not present

## 2019-01-29 DIAGNOSIS — K922 Gastrointestinal hemorrhage, unspecified: Secondary | ICD-10-CM

## 2019-01-29 DIAGNOSIS — I480 Paroxysmal atrial fibrillation: Secondary | ICD-10-CM

## 2019-01-29 DIAGNOSIS — D649 Anemia, unspecified: Secondary | ICD-10-CM | POA: Diagnosis not present

## 2019-01-29 MED ORDER — FERROUS SULFATE 325 (65 FE) MG PO TABS: 325.0000 mg | ORAL_TABLET | Freq: Every day | ORAL | 3 refills | Status: DC

## 2019-01-29 MED ORDER — PANTOPRAZOLE SODIUM 40 MG PO TBEC: 40.0000 mg | DELAYED_RELEASE_TABLET | Freq: Every day | ORAL | 3 refills | Status: DC

## 2019-01-29 NOTE — Progress Notes (Signed)
   Subjective:    Patient ID: Luis Buba., male    DOB: 03-28-1927, 85 y.o.   MRN: [id number]  HPI Here to follow up a hospital stay from 01-09-19 to 01-12-19 for severe anemia. In the week prior to this he had been experiencing some lower abdominal cramps, diarrhea, and was passing some dark stools. He was being treated for presumed diverticulitis with Cipro and Flagyl. Then he was brought by the lab for a CBC and this revealed a Hgb of 7.4. He was asked to go to the ER and he was admitted. He received one unit of PRBC which brought his Hgb up to 8.5. this then settled back down to 7.5 at the time of DC. He had an upper endoscopy that revealed no gastric or duodenal ulcers, but the GI bleeding was felt to be coming from the upper tract. He was taken off aspirin and started on oral iron and Protonix 40 mg daily. The antibiotics were stopped. Since getting back to his assisted living apartment he has felt much better. He feels stronger, his appetite is good, his stools have returned to his normal color and consistency. He has no abdominal pain.   Review of Systems  Constitutional: Negative.   Respiratory: Negative.   Cardiovascular: Negative.   Gastrointestinal: Negative.        Objective:   Physical Exam Constitutional:      Appearance: Normal appearance. He is not ill-appearing.     Comments: Walks with his walker   Cardiovascular:     Rate and Rhythm: Normal rate. Rhythm irregular.     Pulses: Normal pulses.     Heart sounds: Normal heart sounds.  Pulmonary:     Effort: Pulmonary effort is normal.     Breath sounds: Normal breath sounds.  Abdominal:     General: Abdomen is flat. Bowel sounds are normal. There is no distension.     Palpations: Abdomen is soft. There is no mass.     Tenderness: There is no abdominal tenderness. There is no guarding or rebound.     Hernia: No hernia is present.  Neurological:     Mental Status: He is alert.           Assessment & Plan:   He is recovering from an upper GI bleed. He will stay off aspirin and all NSAID's. He will stay on a PPI. We will check another CBC today to assess the anemia. Alysia Penna, MD

## 2019-01-30 DIAGNOSIS — K5730 Diverticulosis of large intestine without perforation or abscess without bleeding: Secondary | ICD-10-CM | POA: Diagnosis not present

## 2019-01-30 DIAGNOSIS — I2510 Atherosclerotic heart disease of native coronary artery without angina pectoris: Secondary | ICD-10-CM | POA: Diagnosis not present

## 2019-01-30 DIAGNOSIS — D62 Acute posthemorrhagic anemia: Secondary | ICD-10-CM | POA: Diagnosis not present

## 2019-01-30 DIAGNOSIS — G319 Degenerative disease of nervous system, unspecified: Secondary | ICD-10-CM | POA: Diagnosis not present

## 2019-01-30 DIAGNOSIS — E119 Type 2 diabetes mellitus without complications: Secondary | ICD-10-CM | POA: Diagnosis not present

## 2019-01-30 DIAGNOSIS — R69 Illness, unspecified: Secondary | ICD-10-CM | POA: Diagnosis not present

## 2019-01-30 DIAGNOSIS — I480 Paroxysmal atrial fibrillation: Secondary | ICD-10-CM | POA: Diagnosis not present

## 2019-01-30 DIAGNOSIS — I119 Hypertensive heart disease without heart failure: Secondary | ICD-10-CM | POA: Diagnosis not present

## 2019-01-30 DIAGNOSIS — M4056 Lordosis, unspecified, lumbar region: Secondary | ICD-10-CM | POA: Diagnosis not present

## 2019-01-30 LAB — CBC WITH DIFFERENTIAL/PLATELET
Basophils Absolute: 0 10*3/uL (ref 0.0–0.1)
Basophils Relative: 1 % (ref 0.0–3.0)
Eosinophils Absolute: 0.1 10*3/uL (ref 0.0–0.7)
Eosinophils Relative: 2 % (ref 0.0–5.0)
HCT: 29.4 % — ABNORMAL LOW (ref 39.0–52.0)
Hemoglobin: 9.4 g/dL — ABNORMAL LOW (ref 13.0–17.0)
Lymphocytes Relative: 17.6 % (ref 12.0–46.0)
Lymphs Abs: 0.7 10*3/uL (ref 0.7–4.0)
MCHC: 32.1 g/dL (ref 30.0–36.0)
MCV: 88.1 fl (ref 78.0–100.0)
Monocytes Absolute: 0.4 10*3/uL (ref 0.1–1.0)
Monocytes Relative: 10.7 % (ref 3.0–12.0)
Neutro Abs: 2.6 10*3/uL (ref 1.4–7.7)
Neutrophils Relative %: 68.7 % (ref 43.0–77.0)
Platelets: 193 10*3/uL (ref 150.0–400.0)
RBC: 3.33 Mil/uL — ABNORMAL LOW (ref 4.22–5.81)
RDW: 17.5 % — ABNORMAL HIGH (ref 11.5–15.5)
WBC: 3.8 10*3/uL — ABNORMAL LOW (ref 4.0–10.5)

## 2019-02-01 DIAGNOSIS — I480 Paroxysmal atrial fibrillation: Secondary | ICD-10-CM | POA: Diagnosis not present

## 2019-02-01 DIAGNOSIS — D62 Acute posthemorrhagic anemia: Secondary | ICD-10-CM | POA: Diagnosis not present

## 2019-02-01 DIAGNOSIS — G319 Degenerative disease of nervous system, unspecified: Secondary | ICD-10-CM | POA: Diagnosis not present

## 2019-02-01 DIAGNOSIS — K449 Diaphragmatic hernia without obstruction or gangrene: Secondary | ICD-10-CM

## 2019-02-01 DIAGNOSIS — I700 Atherosclerosis of aorta: Secondary | ICD-10-CM

## 2019-02-01 DIAGNOSIS — Z79818 Long term (current) use of other agents affecting estrogen receptors and estrogen levels: Secondary | ICD-10-CM

## 2019-02-01 DIAGNOSIS — I119 Hypertensive heart disease without heart failure: Secondary | ICD-10-CM | POA: Diagnosis not present

## 2019-02-01 DIAGNOSIS — G4733 Obstructive sleep apnea (adult) (pediatric): Secondary | ICD-10-CM

## 2019-02-01 DIAGNOSIS — F0280 Dementia in other diseases classified elsewhere without behavioral disturbance: Secondary | ICD-10-CM

## 2019-02-01 DIAGNOSIS — Z952 Presence of prosthetic heart valve: Secondary | ICD-10-CM

## 2019-02-01 DIAGNOSIS — M4056 Lordosis, unspecified, lumbar region: Secondary | ICD-10-CM | POA: Diagnosis not present

## 2019-02-01 DIAGNOSIS — R69 Illness, unspecified: Secondary | ICD-10-CM | POA: Diagnosis not present

## 2019-02-01 DIAGNOSIS — Z9181 History of falling: Secondary | ICD-10-CM

## 2019-02-01 DIAGNOSIS — E119 Type 2 diabetes mellitus without complications: Secondary | ICD-10-CM | POA: Diagnosis not present

## 2019-02-01 DIAGNOSIS — I2510 Atherosclerotic heart disease of native coronary artery without angina pectoris: Secondary | ICD-10-CM | POA: Diagnosis not present

## 2019-02-01 DIAGNOSIS — I6782 Cerebral ischemia: Secondary | ICD-10-CM

## 2019-02-01 DIAGNOSIS — E039 Hypothyroidism, unspecified: Secondary | ICD-10-CM

## 2019-02-01 DIAGNOSIS — Z792 Long term (current) use of antibiotics: Secondary | ICD-10-CM

## 2019-02-01 DIAGNOSIS — K5730 Diverticulosis of large intestine without perforation or abscess without bleeding: Secondary | ICD-10-CM | POA: Diagnosis not present

## 2019-02-01 DIAGNOSIS — Z960 Presence of urogenital implants: Secondary | ICD-10-CM

## 2019-02-01 DIAGNOSIS — C61 Malignant neoplasm of prostate: Secondary | ICD-10-CM

## 2019-02-01 DIAGNOSIS — Z951 Presence of aortocoronary bypass graft: Secondary | ICD-10-CM

## 2019-02-01 DIAGNOSIS — K8020 Calculus of gallbladder without cholecystitis without obstruction: Secondary | ICD-10-CM

## 2019-02-01 DIAGNOSIS — K635 Polyp of colon: Secondary | ICD-10-CM

## 2019-02-01 DIAGNOSIS — F319 Bipolar disorder, unspecified: Secondary | ICD-10-CM

## 2019-02-01 DIAGNOSIS — Z9079 Acquired absence of other genital organ(s): Secondary | ICD-10-CM

## 2019-02-02 ENCOUNTER — Encounter: Payer: Medicare HMO | Admitting: Family Medicine

## 2019-02-06 DIAGNOSIS — I480 Paroxysmal atrial fibrillation: Secondary | ICD-10-CM | POA: Diagnosis not present

## 2019-02-06 DIAGNOSIS — G319 Degenerative disease of nervous system, unspecified: Secondary | ICD-10-CM | POA: Diagnosis not present

## 2019-02-06 DIAGNOSIS — I2510 Atherosclerotic heart disease of native coronary artery without angina pectoris: Secondary | ICD-10-CM | POA: Diagnosis not present

## 2019-02-06 DIAGNOSIS — M4056 Lordosis, unspecified, lumbar region: Secondary | ICD-10-CM | POA: Diagnosis not present

## 2019-02-06 DIAGNOSIS — I119 Hypertensive heart disease without heart failure: Secondary | ICD-10-CM | POA: Diagnosis not present

## 2019-02-06 DIAGNOSIS — R69 Illness, unspecified: Secondary | ICD-10-CM | POA: Diagnosis not present

## 2019-02-06 DIAGNOSIS — K5730 Diverticulosis of large intestine without perforation or abscess without bleeding: Secondary | ICD-10-CM | POA: Diagnosis not present

## 2019-02-06 DIAGNOSIS — D62 Acute posthemorrhagic anemia: Secondary | ICD-10-CM | POA: Diagnosis not present

## 2019-02-06 DIAGNOSIS — E119 Type 2 diabetes mellitus without complications: Secondary | ICD-10-CM | POA: Diagnosis not present

## 2019-02-13 DIAGNOSIS — D62 Acute posthemorrhagic anemia: Secondary | ICD-10-CM | POA: Diagnosis not present

## 2019-02-13 DIAGNOSIS — E119 Type 2 diabetes mellitus without complications: Secondary | ICD-10-CM | POA: Diagnosis not present

## 2019-02-13 DIAGNOSIS — M4056 Lordosis, unspecified, lumbar region: Secondary | ICD-10-CM | POA: Diagnosis not present

## 2019-02-13 DIAGNOSIS — I2510 Atherosclerotic heart disease of native coronary artery without angina pectoris: Secondary | ICD-10-CM | POA: Diagnosis not present

## 2019-02-13 DIAGNOSIS — R69 Illness, unspecified: Secondary | ICD-10-CM | POA: Diagnosis not present

## 2019-02-13 DIAGNOSIS — I119 Hypertensive heart disease without heart failure: Secondary | ICD-10-CM | POA: Diagnosis not present

## 2019-02-13 DIAGNOSIS — I480 Paroxysmal atrial fibrillation: Secondary | ICD-10-CM | POA: Diagnosis not present

## 2019-02-13 DIAGNOSIS — K5730 Diverticulosis of large intestine without perforation or abscess without bleeding: Secondary | ICD-10-CM | POA: Diagnosis not present

## 2019-02-13 DIAGNOSIS — G319 Degenerative disease of nervous system, unspecified: Secondary | ICD-10-CM | POA: Diagnosis not present

## 2019-03-30 DIAGNOSIS — C61 Malignant neoplasm of prostate: Secondary | ICD-10-CM | POA: Diagnosis not present

## 2019-04-05 ENCOUNTER — Other Ambulatory Visit: Payer: Self-pay | Admitting: Family Medicine

## 2019-04-10 ENCOUNTER — Other Ambulatory Visit: Payer: Self-pay | Admitting: Family Medicine

## 2019-04-11 DIAGNOSIS — N35919 Unspecified urethral stricture, male, unspecified site: Secondary | ICD-10-CM | POA: Diagnosis not present

## 2019-04-11 DIAGNOSIS — Z01812 Encounter for preprocedural laboratory examination: Secondary | ICD-10-CM | POA: Diagnosis not present

## 2019-04-11 DIAGNOSIS — Z20822 Contact with and (suspected) exposure to covid-19: Secondary | ICD-10-CM | POA: Diagnosis not present

## 2019-04-16 ENCOUNTER — Telehealth: Payer: Self-pay | Admitting: Family Medicine

## 2019-04-16 DIAGNOSIS — D649 Anemia, unspecified: Secondary | ICD-10-CM

## 2019-04-16 DIAGNOSIS — C61 Malignant neoplasm of prostate: Secondary | ICD-10-CM | POA: Diagnosis not present

## 2019-04-16 NOTE — Telephone Encounter (Signed)
Pt's daughter, Delrae Rend, stated that Poyraz has been having black stools so she wants his hemoglobin checked due to the procedure he is having on Wednesday. They would like to check it tomorrow morning if possible?  He is scheduled for an annual physical for March 11 at 10am. He is unable to come tomorrow for a cpe due to his wife being bed ridden they have to do it during the time either her nurse is there or when someone can be with her.   Tennis Must can be reached at [phone]

## 2019-04-16 NOTE — Telephone Encounter (Signed)
Pts daughter is calling again wanting to see if the lab order for hemoglobin can be placed today.  Daughter is aware that the provider is in with patients and will address in between patients.

## 2019-04-16 NOTE — Telephone Encounter (Signed)
I ordered a CBC for tomorrow

## 2019-04-17 ENCOUNTER — Encounter: Payer: Medicare HMO | Admitting: Family Medicine

## 2019-04-18 DIAGNOSIS — Q6210 Congenital occlusion of ureter, unspecified: Secondary | ICD-10-CM | POA: Diagnosis not present

## 2019-04-18 DIAGNOSIS — C61 Malignant neoplasm of prostate: Secondary | ICD-10-CM | POA: Diagnosis not present

## 2019-04-18 DIAGNOSIS — N35819 Other urethral stricture, male, unspecified site: Secondary | ICD-10-CM | POA: Diagnosis not present

## 2019-04-18 DIAGNOSIS — N135 Crossing vessel and stricture of ureter without hydronephrosis: Secondary | ICD-10-CM | POA: Diagnosis not present

## 2019-04-18 DIAGNOSIS — I10 Essential (primary) hypertension: Secondary | ICD-10-CM | POA: Diagnosis not present

## 2019-04-18 DIAGNOSIS — N131 Hydronephrosis with ureteral stricture, not elsewhere classified: Secondary | ICD-10-CM | POA: Diagnosis not present

## 2019-04-23 ENCOUNTER — Other Ambulatory Visit: Payer: Self-pay | Admitting: Radiation Oncology

## 2019-04-27 DIAGNOSIS — C61 Malignant neoplasm of prostate: Secondary | ICD-10-CM | POA: Diagnosis not present

## 2019-05-03 ENCOUNTER — Encounter: Payer: Medicare HMO | Admitting: Family Medicine

## 2019-05-08 ENCOUNTER — Encounter: Payer: Medicare HMO | Admitting: Family Medicine

## 2019-05-23 ENCOUNTER — Encounter: Payer: Self-pay | Admitting: Family Medicine

## 2019-05-23 ENCOUNTER — Other Ambulatory Visit: Payer: Self-pay

## 2019-05-23 ENCOUNTER — Ambulatory Visit (INDEPENDENT_AMBULATORY_CARE_PROVIDER_SITE_OTHER): Payer: Medicare HMO | Admitting: Family Medicine

## 2019-05-23 VITALS — BP 130/64 | HR 72 | Temp 97.7°F | Ht 69.0 in | Wt 163.2 lb

## 2019-05-23 DIAGNOSIS — E039 Hypothyroidism, unspecified: Secondary | ICD-10-CM

## 2019-05-23 DIAGNOSIS — C61 Malignant neoplasm of prostate: Secondary | ICD-10-CM

## 2019-05-23 DIAGNOSIS — K921 Melena: Secondary | ICD-10-CM | POA: Diagnosis not present

## 2019-05-23 DIAGNOSIS — D649 Anemia, unspecified: Secondary | ICD-10-CM | POA: Diagnosis not present

## 2019-05-23 DIAGNOSIS — I480 Paroxysmal atrial fibrillation: Secondary | ICD-10-CM | POA: Diagnosis not present

## 2019-05-23 DIAGNOSIS — I10 Essential (primary) hypertension: Secondary | ICD-10-CM | POA: Diagnosis not present

## 2019-05-23 DIAGNOSIS — I5032 Chronic diastolic (congestive) heart failure: Secondary | ICD-10-CM | POA: Diagnosis not present

## 2019-05-23 DIAGNOSIS — K219 Gastro-esophageal reflux disease without esophagitis: Secondary | ICD-10-CM

## 2019-05-23 LAB — BASIC METABOLIC PANEL
BUN: 24 mg/dL — ABNORMAL HIGH (ref 6–23)
CO2: 29 mEq/L (ref 19–32)
Calcium: 9 mg/dL (ref 8.4–10.5)
Chloride: 105 mEq/L (ref 96–112)
Creatinine, Ser: 1.13 mg/dL (ref 0.40–1.50)
GFR: 61.01 mL/min (ref >60.00)
Glucose, Bld: 90 mg/dL (ref 70–99)
Potassium: 3.9 mEq/L (ref 3.5–5.1)
Sodium: 140 mEq/L (ref 135–145)

## 2019-05-23 LAB — CBC WITH DIFFERENTIAL/PLATELET
Basophils Absolute: 0 10*3/uL (ref 0.0–0.1)
Basophils Relative: 0.4 % (ref 0.0–3.0)
Eosinophils Absolute: 0.1 10*3/uL (ref 0.0–0.7)
Eosinophils Relative: 1.6 % (ref 0.0–5.0)
HCT: 34.7 % — ABNORMAL LOW (ref 39.0–52.0)
Hemoglobin: 11.6 g/dL — ABNORMAL LOW (ref 13.0–17.0)
Lymphocytes Relative: 19.5 % (ref 12.0–46.0)
Lymphs Abs: 0.8 10*3/uL (ref 0.7–4.0)
MCHC: 33.5 g/dL (ref 30.0–36.0)
MCV: 87.9 fl (ref 78.0–100.0)
Monocytes Absolute: 0.4 10*3/uL (ref 0.1–1.0)
Monocytes Relative: 9.7 % (ref 3.0–12.0)
Neutro Abs: 2.7 10*3/uL (ref 1.4–7.7)
Neutrophils Relative %: 68.8 % (ref 43.0–77.0)
Platelets: 142 10*3/uL — ABNORMAL LOW (ref 150.0–400.0)
RBC: 3.95 Mil/uL — ABNORMAL LOW (ref 4.22–5.81)
RDW: 16.8 % — ABNORMAL HIGH (ref 11.5–15.5)
WBC: 3.9 10*3/uL — ABNORMAL LOW (ref 4.0–10.5)

## 2019-05-23 LAB — LIPID PANEL
Cholesterol: 124 mg/dL (ref 0–200)
HDL: 45.6 mg/dL (ref >39.00)
LDL Cholesterol: 59 mg/dL (ref 0–99)
NonHDL: 78.23
Total CHOL/HDL Ratio: 3
Triglycerides: 95 mg/dL (ref 0.0–149.0)
VLDL: 19 mg/dL (ref 0.0–40.0)

## 2019-05-23 LAB — IBC + FERRITIN
Ferritin: 32.1 ng/mL (ref 22.0–322.0)
Iron: 74 ug/dL (ref 42–165)
Saturation Ratios: 22.9 % (ref 20.0–50.0)
Transferrin: 231 mg/dL (ref 212.0–360.0)

## 2019-05-23 LAB — HEPATIC FUNCTION PANEL
ALT: 13 U/L (ref 0–53)
AST: 15 U/L (ref 0–37)
Albumin: 3.7 g/dL (ref 3.5–5.2)
Alkaline Phosphatase: 68 U/L (ref 39–117)
Bilirubin, Direct: 0.1 mg/dL (ref 0.0–0.3)
Total Bilirubin: 0.6 mg/dL (ref 0.2–1.2)
Total Protein: 6.3 g/dL (ref 6.0–8.3)

## 2019-05-23 LAB — T3, FREE: T3, Free: 2.6 pg/mL (ref 2.3–4.2)

## 2019-05-23 LAB — T4, FREE: Free T4: 0.98 ng/dL (ref 0.60–1.60)

## 2019-05-23 LAB — TSH: TSH: 3.96 u[IU]/mL (ref 0.35–4.50)

## 2019-05-23 MED ORDER — DIPHENOXYLATE-ATROPINE 2.5-0.025 MG PO TABS: 1.0000 | ORAL_TABLET | Freq: Four times a day (QID) | ORAL | 1 refills | Status: DC

## 2019-05-23 NOTE — Progress Notes (Signed)
Subjective:    Patient ID: [name]., male    DOB: [birth year], 86 y.o.   MRN: [id number]  HPI Here for a well exam. He is doing well in general. He sleeps well and his appetite is preserved. He had a ureteral stent replaced last month by Dr. Harlow Asa. He also follows his PSA levels (always at zero). His HTN is stable. He is in and out of atrial fibrillation, but his heart rate is controlled.    Review of Systems  Constitutional: Positive for fatigue.  HENT: Negative.   Eyes: Negative.   Respiratory: Negative.   Cardiovascular: Negative.   Gastrointestinal: Negative.   Genitourinary: Negative.   Musculoskeletal: Positive for arthralgias.  Skin: Negative.   Neurological: Negative.   Psychiatric/Behavioral: Negative.        Objective:   Physical Exam Constitutional:      General: He is not in acute distress.    Appearance: He is well-developed. He is not diaphoretic.     Comments: He walks with a walker   HENT:     Head: Normocephalic and atraumatic.     Right Ear: External ear normal.     Left Ear: External ear normal.     Nose: Nose normal.     Mouth/Throat:     Pharynx: No oropharyngeal exudate.  Eyes:     General: No scleral icterus.       Right eye: No discharge.        Left eye: No discharge.     Conjunctiva/sclera: Conjunctivae normal.     Pupils: Pupils are equal, round, and reactive to light.  Neck:     Thyroid: No thyromegaly.     Vascular: No JVD.     Trachea: No tracheal deviation.  Cardiovascular:     Rate and Rhythm: Normal rate and regular rhythm.     Heart sounds: Normal heart sounds. No murmur. No friction rub. No gallop.   Pulmonary:     Effort: Pulmonary effort is normal. No respiratory distress.     Breath sounds: Normal breath sounds. No wheezing or rales.  Chest:     Chest wall: No tenderness.  Abdominal:     General: Bowel sounds are normal. There is no distension.     Palpations: Abdomen is soft. There is no mass.      Tenderness: There is no abdominal tenderness. There is no guarding or rebound.  Genitourinary:    Penis: No tenderness.   Musculoskeletal:        General: No tenderness. Normal range of motion.     Cervical back: Neck supple.  Lymphadenopathy:     Cervical: No cervical adenopathy.  Skin:    General: Skin is warm and dry.     Coloration: Skin is not pale.     Findings: No erythema or rash.  Neurological:     Mental Status: He is alert and oriented to person, place, and time.     Cranial Nerves: No cranial nerve deficit.     Motor: No abnormal muscle tone.     Coordination: Coordination normal.     Deep Tendon Reflexes: Reflexes are normal and symmetric. Reflexes normal.  Psychiatric:        Behavior: Behavior normal.        Thought Content: Thought content normal.        Judgment: Judgment normal.           Assessment & Plan:  He is doing well in general. His  HTN and paroxysmal atrial fibrillation are stable. His GI bleeding seems to have stopped a month ago, and his anemia has been steadily improving. We wil get labs today to recheck a Hgb, etc. We ill check thyroid levels.  Alysia Penna, MD

## 2019-06-20 ENCOUNTER — Encounter: Payer: Self-pay | Admitting: Oncology

## 2019-06-20 ENCOUNTER — Other Ambulatory Visit: Payer: Self-pay

## 2019-06-20 ENCOUNTER — Inpatient Hospital Stay: Payer: Medicare HMO | Attending: Oncology

## 2019-06-20 ENCOUNTER — Inpatient Hospital Stay (HOSPITAL_BASED_OUTPATIENT_CLINIC_OR_DEPARTMENT_OTHER): Payer: Medicare HMO | Admitting: Oncology

## 2019-06-20 VITALS — BP 125/71 | HR 64 | Temp 98.2°F | Resp 17 | Ht 69.0 in | Wt 162.0 lb

## 2019-06-20 DIAGNOSIS — D649 Anemia, unspecified: Secondary | ICD-10-CM | POA: Diagnosis not present

## 2019-06-20 DIAGNOSIS — G893 Neoplasm related pain (acute) (chronic): Secondary | ICD-10-CM | POA: Diagnosis not present

## 2019-06-20 DIAGNOSIS — N1330 Unspecified hydronephrosis: Secondary | ICD-10-CM | POA: Diagnosis not present

## 2019-06-20 DIAGNOSIS — Z191 Hormone sensitive malignancy status: Secondary | ICD-10-CM | POA: Insufficient documentation

## 2019-06-20 DIAGNOSIS — Z79899 Other long term (current) drug therapy: Secondary | ICD-10-CM | POA: Insufficient documentation

## 2019-06-20 DIAGNOSIS — C61 Malignant neoplasm of prostate: Secondary | ICD-10-CM | POA: Diagnosis not present

## 2019-06-20 LAB — CMP (CANCER CENTER ONLY)
ALT: 14 U/L (ref 0–44)
AST: 16 U/L (ref 15–41)
Albumin: 3.5 g/dL (ref 3.5–5.0)
Alkaline Phosphatase: 76 U/L (ref 38–126)
Anion gap: 7 (ref 5–15)
BUN: 26 mg/dL — ABNORMAL HIGH (ref 8–23)
CO2: 26 mmol/L (ref 22–32)
Calcium: 9.2 mg/dL (ref 8.9–10.3)
Chloride: 108 mmol/L (ref 98–111)
Creatinine: 1.12 mg/dL (ref 0.61–1.24)
GFR, Est AFR Am: >60 mL/min (ref >60)
GFR, Estimated: 58 mL/min — ABNORMAL LOW (ref >60)
Glucose, Bld: 100 mg/dL — ABNORMAL HIGH (ref 70–99)
Potassium: 4.3 mmol/L (ref 3.5–5.1)
Sodium: 141 mmol/L (ref 135–145)
Total Bilirubin: 0.9 mg/dL (ref 0.3–1.2)
Total Protein: 6.8 g/dL (ref 6.5–8.1)

## 2019-06-20 LAB — CBC WITH DIFFERENTIAL (CANCER CENTER ONLY)
Abs Immature Granulocytes: 0.01 10*3/uL (ref 0.00–0.07)
Basophils Absolute: 0 10*3/uL (ref 0.0–0.1)
Basophils Relative: 0 %
Eosinophils Absolute: 0.1 10*3/uL (ref 0.0–0.5)
Eosinophils Relative: 1 %
HCT: 37.8 % — ABNORMAL LOW (ref 39.0–52.0)
Hemoglobin: 12.2 g/dL — ABNORMAL LOW (ref 13.0–17.0)
Immature Granulocytes: 0 %
Lymphocytes Relative: 14 %
Lymphs Abs: 0.7 10*3/uL (ref 0.7–4.0)
MCH: 29.8 pg (ref 26.0–34.0)
MCHC: 32.3 g/dL (ref 30.0–36.0)
MCV: 92.2 fL (ref 80.0–100.0)
Monocytes Absolute: 0.6 10*3/uL (ref 0.1–1.0)
Monocytes Relative: 12 %
Neutro Abs: 3.5 10*3/uL (ref 1.7–7.7)
Neutrophils Relative %: 73 %
Platelet Count: 131 10*3/uL — ABNORMAL LOW (ref 150–400)
RBC: 4.1 MIL/uL — ABNORMAL LOW (ref 4.22–5.81)
RDW: 14.5 % (ref 11.5–15.5)
WBC Count: 4.8 10*3/uL (ref 4.0–10.5)
nRBC: 0 % (ref 0.0–0.2)

## 2019-06-20 NOTE — Progress Notes (Signed)
Hematology and Oncology Follow Up Visit  [name] [id number] 07-26-1927 86 y.o. 06/20/2019 10:03 AM [name], MDFry, Ishmael Holter, MD   Principle Diagnosis: 86 year old man with castration-sensitive with pelvic recurrence noted in December 2018.  He was initially diagnosed in year 2000 with a Gleason score 3+4 = 7.    Prior Therapy:  He is status post radiation therapy total of 44 Gy in 20 fractions to the pelvic nodes as well as boost to the prostate of 60 Gy 20 fractions.  I was completed in October 2019.  Current therapy:  Androgen deprivation under the care of Puschinsky in [facility].  Interim History: [name] is here for a follow-up evaluation.  Since the last visit, he reports no major changes in his health.  He continues to receive androgen deprivation without any major complaints.  He denies any abdominal pain or discomfort.  He does report some loose bowel habits and does use Imodium and Lomotil at times.  He denies any hematochezia or melena.  Performance status activity level remained stable.       Medications: Updated on review.  Current Outpatient Medications  Medication Sig Dispense Refill  . amoxicillin (AMOXIL) 500 MG capsule Take 2,000 mg by mouth as directed. Prior to dental appointment  1  . atorvastatin (LIPITOR) 10 MG tablet TAKE ONE TABLET BY MOUTH DAILY AT 6 PM 90 tablet 2  . Cholecalciferol (VITAMIN D3 PO) Take 2,000 Units by mouth daily.    . diphenoxylate-atropine (LOMOTIL) 2.5-0.025 MG tablet Take 1 tablet by mouth 4 (four) times daily. 360 tablet 1  . fish oil-omega-3 fatty acids 1000 MG capsule Take 1 g by mouth every morning.     [name] Leuprolide Acetate, 6 Month, (LUPRON) 45 MG injection Inject 45 mg into the muscle every 3 (three) months.     . levothyroxine (SYNTHROID) 50 MCG tablet TAKE ONE TABLET BY MOUTH EVERY MORNING 90 tablet 2  . metoprolol tartrate (LOPRESSOR) 25 MG tablet TAKE ONE TABLET BY MOUTH TWICE A DAY 180 tablet 0  .  nitroGLYCERIN (NITROSTAT) 0.4 MG SL tablet Place 1 tablet (0.4 mg total) under the tongue every 5 (five) minutes as needed for chest pain. 25 tablet 3  . pantoprazole (PROTONIX) 40 MG tablet Take 1 tablet (40 mg total) by mouth daily. 90 tablet 3  . vitamin C (ASCORBIC ACID) 500 MG tablet Take 500 mg by mouth every morning.     . ferrous sulfate 325 (65 FE) MG tablet Take 1 tablet (325 mg total) by mouth daily. 90 tablet 3   No current facility-administered medications for this visit.     Allergies:  Allergies  Allergen Reactions  . Other Other (See Comments)    ANESTHESIA.... 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 ANESTHESIA.... 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 Occurred post op CABG 2015, required readmission and rehab      Physical Exam:   Blood pressure 125/71, pulse 64, temperature 98.2 F (36.8 C), temperature source Oral, resp. rate 17, height 5\' 9"  (1.753 m), weight 162 lb (73.5 kg), SpO2 100 %.    ECOG:  1   General appearance: Alert, awake without any distress. Head: Atraumatic without abnormalities Oropharynx: Without any thrush or ulcers. Eyes: No scleral icterus. Lymph nodes: No lymphadenopathy noted in the cervical, supraclavicular, or axillary nodes Heart:regular rate and rhythm, without any murmurs or gallops.   Lung: Clear to auscultation without any rhonchi, wheezes or dullness to  percussion. Abdomin: Soft, nontender without any shifting dullness or ascites. Musculoskeletal: No clubbing or cyanosis. Neurological: No motor or sensory deficits. Skin: No rashes or lesions.       Lab Results: Lab Results  Component Value Date   WBC 4.8 06/20/2019   HGB 12.2 (L) 06/20/2019   HCT 37.8 (L) 06/20/2019   MCV 92.2 06/20/2019   PLT 131 (L) 06/20/2019     Chemistry      Component Value Date/Time   NA 140 05/23/2019 0956   K 3.9 05/23/2019 0956   CL 105 05/23/2019 0956   CO2 29 05/23/2019 0956   BUN 24 (H)  05/23/2019 0956   CREATININE 1.13 05/23/2019 0956   CREATININE 1.33 (H) 03/23/2018 1313      Component Value Date/Time   CALCIUM 9.0 05/23/2019 0956   ALKPHOS 68 05/23/2019 0956   AST 15 05/23/2019 0956   AST 22 03/23/2018 1313   ALT 13 05/23/2019 0956   ALT 18 03/23/2018 1313   BILITOT 0.6 05/23/2019 0956   BILITOT 0.3 03/23/2018 1313     Results for [name], Luis E JR. [name] (MRN [id number]) as of 06/20/2019 10:05  Ref. Range 03/23/2018 13:13  Prostate Specific Ag, Serum Latest Ref Range: 0.0 - 4.0 ng/mL <0.1    Impression and Plan:     86 year old man with:  1.    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  He status post therapy outlined above and currently on androgen deprivation alone.  The natural course of this disease was reviewed and treatment options were discussed.  Therapy escalation with Nicki Reaper and systemic chemotherapy were reviewed and the option of deferring these treatments if he developed castration resistant disease was discussed.  CT scan obtained in October 2020 was personally reviewed and discussed with the patient.  He is agreeable to continue with the current plan and androgen deprivation therapy alone.  We will repeat imaging studies in 6 months to update his staging.   2.  Androgen deprivation: I recommended continuing that indefinitely.  He is scheduled to have that done in the near future with his urologist.  3.  Pelvic pain: Related to his prostate cancer which is improved after radiation.  4.  Hydronephrosis: He has a stent in place that is changed periodically.  5.  Anemia: Resolved at this time with hemoglobin normalizing.  His previous anemia likely related to GI bleed possibly radiation effect.  Hemoglobin today is adequate.  6.  Follow-up: He will return in 6 months for repeat evaluation.  30  minutes were dedicated to this visit. The time was spent on reviewing  laboratory data, imaging studies, discussing treatment options, and answering questions regarding future plan.     [name], MD 4/28/202110:03 AM

## 2019-06-21 ENCOUNTER — Telehealth: Payer: Self-pay | Admitting: Oncology

## 2019-06-21 ENCOUNTER — Telehealth: Payer: Self-pay

## 2019-06-21 LAB — PROSTATE-SPECIFIC AG, SERUM (LABCORP): Prostate Specific Ag, Serum: <0.1 ng/mL (ref 0.0–4.0)

## 2019-06-21 NOTE — Telephone Encounter (Signed)
Called and informed patient of PSA level. Verbalized understanding.

## 2019-06-21 NOTE — Telephone Encounter (Signed)
Scheduled appt per 4/28 los. ° °Left a vm of the appt date and time. °

## 2019-06-21 NOTE — Telephone Encounter (Signed)
-----   Message from Wyatt Portela, MD sent at 06/21/2019  7:59 AM EDT ----- Please let him know his PSA is low

## 2019-08-02 ENCOUNTER — Other Ambulatory Visit: Payer: Self-pay | Admitting: Family Medicine

## 2019-08-03 DIAGNOSIS — C61 Malignant neoplasm of prostate: Secondary | ICD-10-CM | POA: Diagnosis not present

## 2019-08-15 DIAGNOSIS — N39498 Other specified urinary incontinence: Secondary | ICD-10-CM | POA: Diagnosis not present

## 2019-08-15 DIAGNOSIS — N131 Hydronephrosis with ureteral stricture, not elsewhere classified: Secondary | ICD-10-CM | POA: Diagnosis not present

## 2019-08-15 DIAGNOSIS — N35919 Unspecified urethral stricture, male, unspecified site: Secondary | ICD-10-CM | POA: Diagnosis not present

## 2019-08-15 DIAGNOSIS — N368 Other specified disorders of urethra: Secondary | ICD-10-CM | POA: Diagnosis not present

## 2019-08-15 DIAGNOSIS — N135 Crossing vessel and stricture of ureter without hydronephrosis: Secondary | ICD-10-CM | POA: Diagnosis not present

## 2019-08-15 DIAGNOSIS — Z923 Personal history of irradiation: Secondary | ICD-10-CM | POA: Diagnosis not present

## 2019-08-15 DIAGNOSIS — C61 Malignant neoplasm of prostate: Secondary | ICD-10-CM | POA: Diagnosis not present

## 2019-08-28 DIAGNOSIS — C61 Malignant neoplasm of prostate: Secondary | ICD-10-CM | POA: Diagnosis not present

## 2019-10-31 ENCOUNTER — Other Ambulatory Visit: Payer: Self-pay | Admitting: Family Medicine

## 2019-11-06 DIAGNOSIS — C61 Malignant neoplasm of prostate: Secondary | ICD-10-CM | POA: Diagnosis not present

## 2019-11-22 ENCOUNTER — Ambulatory Visit: Payer: Medicare HMO

## 2019-11-28 DIAGNOSIS — R3989 Other symptoms and signs involving the genitourinary system: Secondary | ICD-10-CM | POA: Diagnosis not present

## 2019-11-28 DIAGNOSIS — R32 Unspecified urinary incontinence: Secondary | ICD-10-CM | POA: Diagnosis not present

## 2019-11-28 DIAGNOSIS — N400 Enlarged prostate without lower urinary tract symptoms: Secondary | ICD-10-CM | POA: Diagnosis not present

## 2019-11-28 DIAGNOSIS — C61 Malignant neoplasm of prostate: Secondary | ICD-10-CM | POA: Diagnosis not present

## 2019-11-28 DIAGNOSIS — N135 Crossing vessel and stricture of ureter without hydronephrosis: Secondary | ICD-10-CM | POA: Diagnosis not present

## 2019-12-04 ENCOUNTER — Ambulatory Visit (HOSPITAL_COMMUNITY)
Admission: RE | Admit: 2019-12-04 | Discharge: 2019-12-04 | Disposition: A | Discharge status: Home / Self Care | Payer: Medicare HMO | Source: Ambulatory Visit | Attending: Oncology | Admitting: Oncology

## 2019-12-04 ENCOUNTER — Other Ambulatory Visit: Payer: Self-pay

## 2019-12-04 ENCOUNTER — Inpatient Hospital Stay: Payer: Medicare HMO | Attending: Oncology

## 2019-12-04 ENCOUNTER — Encounter (HOSPITAL_COMMUNITY): Payer: Self-pay

## 2019-12-04 DIAGNOSIS — Z923 Personal history of irradiation: Secondary | ICD-10-CM | POA: Insufficient documentation

## 2019-12-04 DIAGNOSIS — C775 Secondary and unspecified malignant neoplasm of intrapelvic lymph nodes: Secondary | ICD-10-CM | POA: Diagnosis not present

## 2019-12-04 DIAGNOSIS — Z191 Hormone sensitive malignancy status: Secondary | ICD-10-CM | POA: Insufficient documentation

## 2019-12-04 DIAGNOSIS — C61 Malignant neoplasm of prostate: Secondary | ICD-10-CM | POA: Diagnosis not present

## 2019-12-04 DIAGNOSIS — N1330 Unspecified hydronephrosis: Secondary | ICD-10-CM | POA: Insufficient documentation

## 2019-12-04 DIAGNOSIS — M810 Age-related osteoporosis without current pathological fracture: Secondary | ICD-10-CM | POA: Insufficient documentation

## 2019-12-04 DIAGNOSIS — I700 Atherosclerosis of aorta: Secondary | ICD-10-CM | POA: Diagnosis not present

## 2019-12-04 DIAGNOSIS — K449 Diaphragmatic hernia without obstruction or gangrene: Secondary | ICD-10-CM | POA: Diagnosis not present

## 2019-12-04 DIAGNOSIS — K828 Other specified diseases of gallbladder: Secondary | ICD-10-CM | POA: Diagnosis not present

## 2019-12-04 DIAGNOSIS — Z87442 Personal history of urinary calculi: Secondary | ICD-10-CM | POA: Insufficient documentation

## 2019-12-04 DIAGNOSIS — Z79899 Other long term (current) drug therapy: Secondary | ICD-10-CM | POA: Diagnosis not present

## 2019-12-04 DIAGNOSIS — K8020 Calculus of gallbladder without cholecystitis without obstruction: Secondary | ICD-10-CM | POA: Diagnosis not present

## 2019-12-04 LAB — CBC WITH DIFFERENTIAL (CANCER CENTER ONLY)
Abs Immature Granulocytes: 0.01 10*3/uL (ref 0.00–0.07)
Basophils Absolute: 0 10*3/uL (ref 0.0–0.1)
Basophils Relative: 1 %
Eosinophils Absolute: 0.1 10*3/uL (ref 0.0–0.5)
Eosinophils Relative: 3 %
HCT: 35.4 % — ABNORMAL LOW (ref 39.0–52.0)
Hemoglobin: 11.4 g/dL — ABNORMAL LOW (ref 13.0–17.0)
Immature Granulocytes: 0 %
Lymphocytes Relative: 19 %
Lymphs Abs: 0.9 10*3/uL (ref 0.7–4.0)
MCH: 30.4 pg (ref 26.0–34.0)
MCHC: 32.2 g/dL (ref 30.0–36.0)
MCV: 94.4 fL (ref 80.0–100.0)
Monocytes Absolute: 0.5 10*3/uL (ref 0.1–1.0)
Monocytes Relative: 11 %
Neutro Abs: 3.1 10*3/uL (ref 1.7–7.7)
Neutrophils Relative %: 66 %
Platelet Count: 147 10*3/uL — ABNORMAL LOW (ref 150–400)
RBC: 3.75 MIL/uL — ABNORMAL LOW (ref 4.22–5.81)
RDW: 13.2 % (ref 11.5–15.5)
WBC Count: 4.6 10*3/uL (ref 4.0–10.5)
nRBC: 0 % (ref 0.0–0.2)

## 2019-12-04 LAB — CMP (CANCER CENTER ONLY)
ALT: 13 U/L (ref 0–44)
AST: 18 U/L (ref 15–41)
Albumin: 3.2 g/dL — ABNORMAL LOW (ref 3.5–5.0)
Alkaline Phosphatase: 66 U/L (ref 38–126)
Anion gap: 4 — ABNORMAL LOW (ref 5–15)
BUN: 19 mg/dL (ref 8–23)
CO2: 30 mmol/L (ref 22–32)
Calcium: 9.1 mg/dL (ref 8.9–10.3)
Chloride: 106 mmol/L (ref 98–111)
Creatinine: 1.09 mg/dL (ref 0.61–1.24)
GFR, Estimated: 59 mL/min — ABNORMAL LOW (ref >60)
Glucose, Bld: 81 mg/dL (ref 70–99)
Potassium: 4.3 mmol/L (ref 3.5–5.1)
Sodium: 140 mmol/L (ref 135–145)
Total Bilirubin: 0.5 mg/dL (ref 0.3–1.2)
Total Protein: 6.6 g/dL (ref 6.5–8.1)

## 2019-12-05 LAB — PROSTATE-SPECIFIC AG, SERUM (LABCORP): Prostate Specific Ag, Serum: <0.1 ng/mL (ref 0.0–4.0)

## 2019-12-07 ENCOUNTER — Other Ambulatory Visit: Payer: Medicare HMO

## 2019-12-14 ENCOUNTER — Other Ambulatory Visit: Payer: Self-pay

## 2019-12-14 ENCOUNTER — Inpatient Hospital Stay: Payer: Medicare HMO | Admitting: Oncology

## 2019-12-14 VITALS — BP 126/56 | HR 60 | Temp 97.8°F | Resp 18 | Ht 69.0 in | Wt 162.9 lb

## 2019-12-14 DIAGNOSIS — C61 Malignant neoplasm of prostate: Secondary | ICD-10-CM

## 2019-12-14 NOTE — Progress Notes (Signed)
Hematology and Oncology Follow Up Visit  [name] [id number] 01-Oct-1927 86 y.o. 12/14/2019 8:59 AM [name], MDFry, Ishmael Holter, MD   Principle Diagnosis: 86 year old man with advanced prostate cancer diagnosed initially in year 2000 and subsequently developed castration-sensitive disease with pelvic recurrence noted in December 2018.   Prior Therapy:  He is status post radiation therapy total of 44 Gy in 20 fractions to the pelvic nodes as well as boost to the prostate of 60 Gy 20 fractions.  I was completed in October 2019.  Current therapy:  Androgen deprivation under the care of [name] in [name].  Interim History: [name] returns today for a follow-up visit.  Since the last visit, he has underwent current cystoscopy and ureteroscopy under the care of his urologist at [facility] without complications. Continues to have chronic diarrhea which has not dramatically changed at this time. His performance status and quality of life remains unchanged.       Medications: Discussed without any changes. Current Outpatient Medications  Medication Sig Dispense Refill  . amoxicillin (AMOXIL) 500 MG capsule Take 2,000 mg by mouth as directed. Prior to dental appointment  1  . atorvastatin (LIPITOR) 10 MG tablet TAKE ONE TABLET BY MOUTH DAILY AT 6 PM 90 tablet 2  . Cholecalciferol (VITAMIN D3 PO) Take 2,000 Units by mouth daily.    . diphenoxylate-atropine (LOMOTIL) 2.5-0.025 MG tablet Take 1 tablet by mouth 4 (four) times daily. 360 tablet 1  . ferrous sulfate 325 (65 FE) MG tablet Take 1 tablet (325 mg total) by mouth daily. 90 tablet 3  . fish oil-omega-3 fatty acids 1000 MG capsule Take 1 g by mouth every morning.     [name] Leuprolide Acetate, 6 Month, (LUPRON) 45 MG injection Inject 45 mg into the muscle every 3 (three) months.     . levothyroxine (SYNTHROID) 50 MCG tablet TAKE ONE TABLET BY MOUTH EVERY MORNING 90 tablet 2  . metoprolol tartrate (LOPRESSOR) 25 MG tablet  TAKE ONE TABLET BY MOUTH TWICE A DAY 180 tablet 0  . nitroGLYCERIN (NITROSTAT) 0.4 MG SL tablet Place 1 tablet (0.4 mg total) under the tongue every 5 (five) minutes as needed for chest pain. 25 tablet 3  . pantoprazole (PROTONIX) 40 MG tablet Take 1 tablet (40 mg total) by mouth daily. 90 tablet 3  . vitamin C (ASCORBIC ACID) 500 MG tablet Take 500 mg by mouth every morning.      No current facility-administered medications for this visit.     Allergies:  Allergies  Allergen Reactions  . Other Other (See Comments)    ANESTHESIA.... 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 ANESTHESIA.... 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 Occurred post op CABG 2015, required readmission and rehab      Physical Exam:   Blood pressure (!) 126/56, pulse 60, temperature 97.8 F (36.6 C), temperature source Tympanic, resp. rate 18, height 5\' 9"  (1.753 m), weight 162 lb 14.4 oz (73.9 kg), SpO2 98 %.     ECOG:  1    General appearance: Comfortable appearing without any discomfort Head: Normocephalic without any trauma Oropharynx: Mucous membranes are moist and pink without any thrush or ulcers. Eyes: Pupils are equal and round reactive to light. Lymph nodes: No cervical, supraclavicular, inguinal or axillary lymphadenopathy.   Heart:regular rate and rhythm.  S1 and S2 without leg edema. Lung: Clear without any rhonchi or wheezes.  No dullness to percussion. Abdomin: Soft, nontender, nondistended  with good bowel sounds.  No hepatosplenomegaly. Musculoskeletal: No joint deformity or effusion.  Full range of motion noted. Neurological: No deficits noted on motor, sensory and deep tendon reflex exam. Skin: No petechial rash or dryness.  Appeared moist.         Lab Results: Lab Results  Component Value Date   WBC 4.6 12/04/2019   HGB 11.4 (L) 12/04/2019   HCT 35.4 (L) 12/04/2019   MCV 94.4 12/04/2019   PLT 147 (L) 12/04/2019     Chemistry       Component Value Date/Time   NA 140 12/04/2019 1237   K 4.3 12/04/2019 1237   CL 106 12/04/2019 1237   CO2 30 12/04/2019 1237   BUN 19 12/04/2019 1237   CREATININE 1.09 12/04/2019 1237      Component Value Date/Time   CALCIUM 9.1 12/04/2019 1237   ALKPHOS 66 12/04/2019 1237   AST 18 12/04/2019 1237   ALT 13 12/04/2019 1237   BILITOT 0.5 12/04/2019 1237     IMPRESSION: 1. Status post prostatectomy. 2. Unchanged soft tissue thickening about the distal left ureter and low left hemipelvis. 3. Unchanged position of a left-sided double-J ureteral stent with formed pigtails within the left renal pelvis and urinary bladder. Unchanged stranding about the left renal pelvis and proximal left ureter. No hydronephrosis. 4. No evidence of lymphadenopathy in the abdomen or pelvis. 5. There is a new superior endplate deformity of the L5 vertebral body with approximately 30% height loss. Correlate for acute pain and point tenderness. MRI may be used to assess for marrow edema and fracture acuity if desired. 6. Partially imaged large hiatal hernia with complete intrathoracic position of the stomach, incompletely imaged. 7. Cholelithiasis. 8. Redemonstrated rim calcification of the gallbladder fundus, which may reflect porcelain gallbladder. 9. Aortic Atherosclerosis (ICD10-I70.0).  Impression and Plan:     86 year old man with:  1.   Advanced prostate cancer with locally advanced disease in the pelvis noted in 2018. He has castration-sensitive disease at this time.  His disease status was outlined at this time. CT scan obtained on December 04, 2019 was reviewed and showed no evidence of disease relapse. His PSA continues to be undetectable. Risks and benefits of continuing this treatment at this time were reviewed. Additional therapy will be considered if he developed castration resistant.   2.  Androgen deprivation: He continues to receive adequate care of his urologist. I continue to  recommend that indefinitely.  3.   Osteoporosis: Likely related to androgen deprivation as well as age. Recommend calcium and vitamin D supplements for the time being. Consideration for Prolia in the future.  4.  Hydronephrosis: He follows with urology regarding this issue with stent replaced recently.  5.  Anemia: Hemoglobin is adequate at this time. 30 minutes were related chronic.  6.  Follow-up: In 6 months for repeat follow-up.  30  minutes were spent on this encounter. Time was dedicated to reviewing the status, treatment options and future plan.     [name], MD 10/22/20218:59 AM

## 2019-12-21 DIAGNOSIS — C61 Malignant neoplasm of prostate: Secondary | ICD-10-CM | POA: Diagnosis not present

## 2019-12-25 ENCOUNTER — Ambulatory Visit (INDEPENDENT_AMBULATORY_CARE_PROVIDER_SITE_OTHER): Payer: Medicare HMO | Admitting: *Deleted

## 2019-12-25 DIAGNOSIS — Z23 Encounter for immunization: Secondary | ICD-10-CM | POA: Diagnosis not present

## 2020-01-03 ENCOUNTER — Other Ambulatory Visit: Payer: Self-pay

## 2020-01-03 ENCOUNTER — Encounter: Payer: Self-pay | Admitting: Family Medicine

## 2020-01-03 ENCOUNTER — Ambulatory Visit (INDEPENDENT_AMBULATORY_CARE_PROVIDER_SITE_OTHER): Payer: Medicare HMO | Admitting: Family Medicine

## 2020-01-03 VITALS — BP 120/68 | HR 59 | Temp 98.5°F | Ht 68.0 in | Wt 162.0 lb

## 2020-01-03 DIAGNOSIS — R6889 Other general symptoms and signs: Secondary | ICD-10-CM | POA: Diagnosis not present

## 2020-01-03 DIAGNOSIS — E039 Hypothyroidism, unspecified: Secondary | ICD-10-CM

## 2020-01-03 DIAGNOSIS — Z0000 Encounter for general adult medical examination without abnormal findings: Secondary | ICD-10-CM | POA: Diagnosis not present

## 2020-01-03 MED ORDER — FERROUS SULFATE 325 (65 FE) MG PO TABS: 325.0000 mg | ORAL_TABLET | Freq: Every day | ORAL | 3 refills | Status: DC

## 2020-01-03 MED ORDER — PANTOPRAZOLE SODIUM 40 MG PO TBEC
40.0000 mg | DELAYED_RELEASE_TABLET | Freq: Every day | ORAL | 3 refills | Status: DC
Start: 2020-01-03 — End: 2020-06-16

## 2020-01-03 NOTE — Progress Notes (Signed)
   Subjective:    Patient ID: Luis Buba., male    DOB: 1927/04/01, 86 y.o.   MRN: [id number]  HPI Here for a well exam. He is doing well in general. He sees Dr. Harlow Asa and Dr. Alen Blew frequently to follow up prostate cancer.    Review of Systems  Constitutional: Negative.   HENT: Negative.   Eyes: Negative.   Respiratory: Negative.   Cardiovascular: Negative.   Gastrointestinal: Negative.   Genitourinary: Negative.   Musculoskeletal: Positive for arthralgias.  Skin: Negative.   Neurological: Negative.   Psychiatric/Behavioral: Negative.        Objective:   Physical Exam Constitutional:      General: He is not in acute distress.    Appearance: He is well-developed. He is not diaphoretic.     Comments: Walks with a walker   HENT:     Head: Normocephalic and atraumatic.     Right Ear: External ear normal.     Left Ear: External ear normal.     Nose: Nose normal.     Mouth/Throat:     Pharynx: No oropharyngeal exudate.  Eyes:     General: No scleral icterus.       Right eye: No discharge.        Left eye: No discharge.     Conjunctiva/sclera: Conjunctivae normal.     Pupils: Pupils are equal, round, and reactive to light.  Neck:     Thyroid: No thyromegaly.     Vascular: No JVD.     Trachea: No tracheal deviation.  Cardiovascular:     Rate and Rhythm: Normal rate and regular rhythm.     Heart sounds: Normal heart sounds. No murmur heard.  No friction rub. No gallop.   Pulmonary:     Effort: Pulmonary effort is normal. No respiratory distress.     Breath sounds: Normal breath sounds. No wheezing or rales.  Chest:     Chest wall: No tenderness.  Abdominal:     General: Bowel sounds are normal. There is no distension.     Palpations: Abdomen is soft. There is no mass.     Tenderness: There is no abdominal tenderness. There is no guarding or rebound.  Genitourinary:    Penis: No tenderness.   Musculoskeletal:        General: No tenderness. Normal range  of motion.     Cervical back: Neck supple.  Lymphadenopathy:     Cervical: No cervical adenopathy.  Skin:    General: Skin is warm and dry.     Coloration: Skin is not pale.     Findings: No erythema or rash.  Neurological:     Mental Status: He is alert and oriented to person, place, and time.     Cranial Nerves: No cranial nerve deficit.     Motor: No abnormal muscle tone.     Coordination: Coordination normal.     Deep Tendon Reflexes: Reflexes are normal and symmetric. Reflexes normal.  Psychiatric:        Behavior: Behavior normal.        Thought Content: Thought content normal.        Judgment: Judgment normal.           Assessment & Plan:  Well exam. We discussed diet and exercise. Get fasting labs.  Alysia Penna, MD

## 2020-01-04 LAB — HEPATIC FUNCTION PANEL
AG Ratio: 1.7 (calc) (ref 1.0–2.5)
ALT: 8 U/L — ABNORMAL LOW (ref 9–46)
AST: 14 U/L (ref 10–35)
Albumin: 3.8 g/dL (ref 3.6–5.1)
Alkaline phosphatase (APISO): 66 U/L (ref 35–144)
Bilirubin, Direct: 0.2 mg/dL (ref 0.0–0.2)
Globulin: 2.3 g/dL (calc) (ref 1.9–3.7)
Indirect Bilirubin: 0.5 mg/dL (calc) (ref 0.2–1.2)
Total Bilirubin: 0.7 mg/dL (ref 0.2–1.2)
Total Protein: 6.1 g/dL (ref 6.1–8.1)

## 2020-01-04 LAB — LIPID PANEL
Cholesterol: 130 mg/dL (ref <200)
HDL: 51 mg/dL (ref >=40)
LDL Cholesterol (Calc): 63 mg/dL
Non-HDL Cholesterol (Calc): 79 mg/dL (ref <130)
Total CHOL/HDL Ratio: 2.5 (calc) (ref <5.0)
Triglycerides: 76 mg/dL (ref <150)

## 2020-01-04 LAB — CBC WITH DIFFERENTIAL/PLATELET
Absolute Monocytes: 382 cells/uL (ref 200–950)
Basophils Absolute: 41 cells/uL (ref 0–200)
Basophils Relative: 0.9 %
Eosinophils Absolute: 69 cells/uL (ref 15–500)
Eosinophils Relative: 1.5 %
HCT: 36 % — ABNORMAL LOW (ref 38.5–50.0)
Hemoglobin: 11.9 g/dL — ABNORMAL LOW (ref 13.2–17.1)
Lymphs Abs: 925 cells/uL (ref 850–3900)
MCH: 30.7 pg (ref 27.0–33.0)
MCHC: 33.1 g/dL (ref 32.0–36.0)
MCV: 92.8 fL (ref 80.0–100.0)
MPV: 8.8 fL (ref 7.5–12.5)
Monocytes Relative: 8.3 %
Neutro Abs: 3183 cells/uL (ref 1500–7800)
Neutrophils Relative %: 69.2 %
Platelets: 146 10*3/uL (ref 140–400)
RBC: 3.88 10*6/uL — ABNORMAL LOW (ref 4.20–5.80)
RDW: 12.5 % (ref 11.0–15.0)
Total Lymphocyte: 20.1 %
WBC: 4.6 10*3/uL (ref 3.8–10.8)

## 2020-01-04 LAB — BASIC METABOLIC PANEL WITHOUT GFR
BUN: 22 mg/dL (ref 7–25)
CO2: 28 mmol/L (ref 20–32)
Calcium: 9 mg/dL (ref 8.6–10.3)
Chloride: 107 mmol/L (ref 98–110)
Creat: 1.07 mg/dL (ref 0.70–1.11)
GFR, Est African American: 70 mL/min/1.73m2 (ref >=60)
GFR, Est Non African American: 61 mL/min/1.73m2 (ref >=60)
Glucose, Bld: 84 mg/dL (ref 65–99)
Potassium: 4.2 mmol/L (ref 3.5–5.3)
Sodium: 143 mmol/L (ref 135–146)

## 2020-01-04 LAB — VITAMIN D 25 HYDROXY (VIT D DEFICIENCY, FRACTURES): Vit D, 25-Hydroxy: 39 ng/mL (ref 30–100)

## 2020-01-04 LAB — T4, FREE: Free T4: 1.2 ng/dL (ref 0.8–1.8)

## 2020-01-04 LAB — T3, FREE: T3, Free: 2.5 pg/mL (ref 2.3–4.2)

## 2020-01-04 LAB — VITAMIN B12: Vitamin B-12: 548 pg/mL (ref 200–1100)

## 2020-01-04 LAB — TSH: TSH: 2.62 m[IU]/L (ref 0.40–4.50)

## 2020-01-25 DIAGNOSIS — M79671 Pain in right foot: Secondary | ICD-10-CM | POA: Diagnosis not present

## 2020-01-25 DIAGNOSIS — Q6689 Other  specified congenital deformities of feet: Secondary | ICD-10-CM | POA: Diagnosis not present

## 2020-01-25 DIAGNOSIS — L84 Corns and callosities: Secondary | ICD-10-CM | POA: Diagnosis not present

## 2020-01-25 DIAGNOSIS — B351 Tinea unguium: Secondary | ICD-10-CM | POA: Diagnosis not present

## 2020-01-25 DIAGNOSIS — M79672 Pain in left foot: Secondary | ICD-10-CM | POA: Diagnosis not present

## 2020-01-31 ENCOUNTER — Other Ambulatory Visit: Payer: Self-pay | Admitting: Family Medicine

## 2020-01-31 NOTE — Telephone Encounter (Signed)
Last OV 01/03/20 Last refill Levothyroxine 04/10/19 #90/2                Metoprolol Tart 10/31/19 #180/0                Atorvastatin 04/10/19 #90/2 Next OV not scheduled

## 2020-02-12 DIAGNOSIS — C61 Malignant neoplasm of prostate: Secondary | ICD-10-CM | POA: Diagnosis not present

## 2020-03-24 DIAGNOSIS — C61 Malignant neoplasm of prostate: Secondary | ICD-10-CM | POA: Diagnosis not present

## 2020-04-02 DIAGNOSIS — N1330 Unspecified hydronephrosis: Secondary | ICD-10-CM | POA: Diagnosis not present

## 2020-04-02 DIAGNOSIS — C61 Malignant neoplasm of prostate: Secondary | ICD-10-CM | POA: Diagnosis not present

## 2020-04-02 DIAGNOSIS — N135 Crossing vessel and stricture of ureter without hydronephrosis: Secondary | ICD-10-CM | POA: Diagnosis not present

## 2020-04-02 DIAGNOSIS — N131 Hydronephrosis with ureteral stricture, not elsewhere classified: Secondary | ICD-10-CM | POA: Diagnosis not present

## 2020-04-02 DIAGNOSIS — K8020 Calculus of gallbladder without cholecystitis without obstruction: Secondary | ICD-10-CM | POA: Diagnosis not present

## 2020-04-02 DIAGNOSIS — N35812 Other urethral bulbous stricture, male: Secondary | ICD-10-CM | POA: Diagnosis not present

## 2020-04-04 DIAGNOSIS — L84 Corns and callosities: Secondary | ICD-10-CM | POA: Diagnosis not present

## 2020-04-04 DIAGNOSIS — M79672 Pain in left foot: Secondary | ICD-10-CM | POA: Diagnosis not present

## 2020-04-04 DIAGNOSIS — M79671 Pain in right foot: Secondary | ICD-10-CM | POA: Diagnosis not present

## 2020-04-04 DIAGNOSIS — B351 Tinea unguium: Secondary | ICD-10-CM | POA: Diagnosis not present

## 2020-04-09 ENCOUNTER — Telehealth: Payer: Self-pay | Admitting: Family Medicine

## 2020-04-09 NOTE — Telephone Encounter (Signed)
Tried calling patient to schedule Medicare Annual Wellness Visit (AWV) either virtually or in office.  No answre  Last AWV please schedule at anytime with [facility] Nurse Health Advisor 1 or 2   This should be a 45 minute visit.

## 2020-04-11 ENCOUNTER — Telehealth: Payer: Self-pay | Admitting: Family Medicine

## 2020-04-11 DIAGNOSIS — R197 Diarrhea, unspecified: Secondary | ICD-10-CM

## 2020-04-11 NOTE — Telephone Encounter (Signed)
I did the referral 

## 2020-04-11 NOTE — Telephone Encounter (Addendum)
Pt daugther Delrae Rend calling stating patient would like a referral to [facility] Dr. Loletha Carrow. Patient is having more frequent diarrhea.

## 2020-04-11 NOTE — Telephone Encounter (Signed)
Okay to refer? 

## 2020-04-16 ENCOUNTER — Other Ambulatory Visit: Payer: Self-pay | Admitting: Family Medicine

## 2020-04-17 DIAGNOSIS — C61 Malignant neoplasm of prostate: Secondary | ICD-10-CM | POA: Diagnosis not present

## 2020-05-20 DIAGNOSIS — C61 Malignant neoplasm of prostate: Secondary | ICD-10-CM | POA: Diagnosis not present

## 2020-05-29 ENCOUNTER — Telehealth: Payer: Self-pay | Admitting: Family Medicine

## 2020-05-29 MED ORDER — DIPHENOXYLATE-ATROPINE 2.5-0.025 MG PO TABS: 1.0000 | ORAL_TABLET | Freq: Four times a day (QID) | ORAL | 5 refills | Status: DC

## 2020-05-29 NOTE — Telephone Encounter (Signed)
Done

## 2020-05-29 NOTE — Telephone Encounter (Signed)
Patient misplaced prescription for Lomotil and needs it called in.  Patient needs a 30 day supply.  Patient is having diarrhea because he lost it about 3 days ago.  Pharmacy- [facility]

## 2020-05-29 NOTE — Telephone Encounter (Signed)
LVM for daughter Delrae Rend informing med had been sent to pharmacy

## 2020-05-29 NOTE — Telephone Encounter (Signed)
Last refill- 04/17/2020-360 tabs-1 tab po qid Last office visit- 01/03/2020  No future appointment has been scheduled

## 2020-05-30 NOTE — Telephone Encounter (Signed)
Spoke with Dorothea Ogle, the pharmacist and informed him of the approval below per PCP.

## 2020-05-30 NOTE — Telephone Encounter (Signed)
Please okay the early refill  

## 2020-05-30 NOTE — Telephone Encounter (Signed)
The daughter went to pick up the medication and they pharmacy won't fill it with out authorization to fill it early.  [facility] [address], [place] Phone:  [phone]  Fax:  [phone]

## 2020-06-13 ENCOUNTER — Ambulatory Visit: Payer: Medicare HMO | Admitting: Oncology

## 2020-06-13 ENCOUNTER — Other Ambulatory Visit: Payer: Medicare HMO

## 2020-06-13 DIAGNOSIS — M79672 Pain in left foot: Secondary | ICD-10-CM | POA: Diagnosis not present

## 2020-06-13 DIAGNOSIS — L84 Corns and callosities: Secondary | ICD-10-CM | POA: Diagnosis not present

## 2020-06-13 DIAGNOSIS — M79671 Pain in right foot: Secondary | ICD-10-CM | POA: Diagnosis not present

## 2020-06-13 DIAGNOSIS — B351 Tinea unguium: Secondary | ICD-10-CM | POA: Diagnosis not present

## 2020-06-16 ENCOUNTER — Encounter: Payer: Self-pay | Admitting: Gastroenterology

## 2020-06-16 ENCOUNTER — Other Ambulatory Visit: Payer: Self-pay

## 2020-06-16 ENCOUNTER — Other Ambulatory Visit (INDEPENDENT_AMBULATORY_CARE_PROVIDER_SITE_OTHER): Payer: Medicare HMO

## 2020-06-16 ENCOUNTER — Ambulatory Visit: Payer: Medicare HMO | Admitting: Gastroenterology

## 2020-06-16 VITALS — BP 110/60 | HR 60 | Ht 66.75 in | Wt 163.5 lb

## 2020-06-16 DIAGNOSIS — I10 Essential (primary) hypertension: Secondary | ICD-10-CM

## 2020-06-16 DIAGNOSIS — D500 Iron deficiency anemia secondary to blood loss (chronic): Secondary | ICD-10-CM

## 2020-06-16 DIAGNOSIS — K529 Noninfective gastroenteritis and colitis, unspecified: Secondary | ICD-10-CM | POA: Diagnosis not present

## 2020-06-16 LAB — CBC WITH DIFFERENTIAL/PLATELET
Basophils Absolute: 0 10*3/uL (ref 0.0–0.1)
Basophils Relative: 0.7 % (ref 0.0–3.0)
Eosinophils Absolute: 0.1 10*3/uL (ref 0.0–0.7)
Eosinophils Relative: 1.4 % (ref 0.0–5.0)
HCT: 35 % — ABNORMAL LOW (ref 39.0–52.0)
Hemoglobin: 12.1 g/dL — ABNORMAL LOW (ref 13.0–17.0)
Lymphocytes Relative: 21 % (ref 12.0–46.0)
Lymphs Abs: 1 10*3/uL (ref 0.7–4.0)
MCHC: 34.5 g/dL (ref 30.0–36.0)
MCV: 90.4 fl (ref 78.0–100.0)
Monocytes Absolute: 0.4 10*3/uL (ref 0.1–1.0)
Monocytes Relative: 9.3 % (ref 3.0–12.0)
Neutro Abs: 3.2 10*3/uL (ref 1.4–7.7)
Neutrophils Relative %: 67.6 % (ref 43.0–77.0)
Platelets: 161 10*3/uL (ref 150.0–400.0)
RBC: 3.87 Mil/uL — ABNORMAL LOW (ref 4.22–5.81)
RDW: 13.6 % (ref 11.5–15.5)
WBC: 4.7 10*3/uL (ref 4.0–10.5)

## 2020-06-16 LAB — COMPREHENSIVE METABOLIC PANEL
ALT: 21 U/L (ref 0–53)
AST: 21 U/L (ref 0–37)
Albumin: 3.5 g/dL (ref 3.5–5.2)
Alkaline Phosphatase: 57 U/L (ref 39–117)
BUN: 23 mg/dL (ref 6–23)
CO2: 27 mEq/L (ref 19–32)
Calcium: 9 mg/dL (ref 8.4–10.5)
Chloride: 106 mEq/L (ref 96–112)
Creatinine, Ser: 1.2 mg/dL (ref 0.40–1.50)
GFR: 53.16 mL/min — ABNORMAL LOW (ref >60.00)
Glucose, Bld: 72 mg/dL (ref 70–99)
Potassium: 4.4 mEq/L (ref 3.5–5.1)
Sodium: 140 mEq/L (ref 135–145)
Total Bilirubin: 0.4 mg/dL (ref 0.2–1.2)
Total Protein: 6.6 g/dL (ref 6.0–8.3)

## 2020-06-16 LAB — TSH: TSH: 4.37 u[IU]/mL (ref 0.35–4.50)

## 2020-06-16 LAB — T4, FREE: Free T4: 0.93 ng/dL (ref 0.60–1.60)

## 2020-06-16 NOTE — Progress Notes (Signed)
[facility] Consult Note:  History: [name]. 06/16/2020  Referring provider: [name], MD  Reason for consult/chief complaint: Diarrhea (Started March 7) and Encopresis   Subjective  HPI: Referred mid February by PCP , Dr. Alysia Penna for chronic diarrhea.  He was seen by Dr. Carlean Purl in hospital consultation 01/10/2019 for anemia and reported melena (though heme-negative on admission).  I did his upper endoscopy which was an incomplete exam since the scope could not be passed beyond the antrum due to a very large hiatal hernia, almost entirely intrathoracic stomach. Last colonoscopy by Dr. Sharlett Iles in August 2012, adenomatous polyps removed. _______  [name] is here with his daughter, who assists somewhat with history.  [name] describes frequent diarrhea with loose nonbloody stool since his prostate surgery several years ago.  He was prescribed Lomotil at that time and has taking it anywhere from 2-4 times a day most of the time since then.  He denies abdominal pain, nausea or vomiting, and says his appetite is good.  His daughter says he has a "terrible diet", eating mostly cereal and sweets with no fruits or vegetables and that she does not think he consumes enough nutrition. He brought a partial diary of his bowel function for about the last 6 weeks, and it seems that most days he has 1-3 loose stools, but other days his stools are recorded as normal.  He has been on an iron supplement ever since the 2020 hospitalization for anemia, and this turns his stool dark.  He recently stopped his proton pump inhibitor since he learned that it might cause diarrhea, but things were not improved after that.  ROS:  Review of Systems  Constitutional: Negative for appetite change and unexpected weight change.  HENT: Negative for mouth sores and voice change.   Eyes: Negative for pain and redness.  Respiratory: Negative for cough and shortness of breath.    Cardiovascular: Negative for chest pain and palpitations.  Genitourinary: Negative for dysuria and hematuria.  Musculoskeletal: Positive for arthralgias. Negative for myalgias.  Skin: Negative for pallor and rash.  Neurological: Negative for weakness and headaches.  Hematological: Negative for adenopathy.     Past Medical History: Past Medical History:  Diagnosis Date  . Complication of anesthesia    "serious cognisance problems since my OR in January" (03/13/2013  . Coronary artery disease   . Diverticulosis   . Hiatal hernia   . Hx of echocardiogram    Echo 5/16:  Mild focal basal septal hypertrophy, EF 55%, mild AI, MV repair ok with trivial MR (mean 3 mmHg), mild LAE, mild RAE, PASP 35 mmHg  . Hypertension   . Hypothyroidism   . Irritable bowel syndrome   . PAF (paroxysmal atrial fibrillation) ([place]) 05/03/2014  . Personal history of colonic polyps    tubular adenoma  . Pleural effusion, bilateral 03/12/2013   Small to moderate, L>R  . Prostate cancer [facility]) 1991   sees Dr. Carlota Raspberry at [facility], observation only   . S/P CABG x 2 02/28/2013   LIMA to LAD, SVG to OM1, EVH via right thigh  . S/P Maze operation for atrial fibrillation 02/28/2013   Complete bilateral atrial lesion set using bipolar radiofrequency and cryothermy ablation with clipping of LA appendage  . S/P mitral valve repair, maze procedure, and CABG x2 02/28/2013   Complex valvuloplasty including triangular resection of posterior leaflet, 30 mm Sorin Memo 3D ring annuloplasty     Past Surgical History: Past Surgical History:  Procedure Laterality Date  . CARDIAC CATHETERIZATION  2015  . CATARACT EXTRACTION, BILATERAL Bilateral 2014   per Dr. Gershon Crane   . COLONOSCOPY  2011   per Dr. Sharlett Iles, benign polyps, no repeats needed   . CORONARY ARTERY BYPASS GRAFT N/A 02/28/2013   Procedure: CORONARY ARTERY BYPASS GRAFTING (CABG);  Surgeon: Rexene Alberts, MD;  Location: [place];  Service: Open Heart Surgery;  Laterality:  N/A;  CABG x 2,  using left internal mammary artery and right leg saphenous vein harvested endoscopically  . ESOPHAGOGASTRODUODENOSCOPY (EGD) WITH PROPOFOL N/A 01/11/2019   Procedure: ESOPHAGOGASTRODUODENOSCOPY (EGD) WITH PROPOFOL;  Surgeon: Doran Stabler, MD;  Location: [facility];  Service: Gastroenterology;  Laterality: N/A;  . HEMORRHOID SURGERY    . INTRAOPERATIVE TRANSESOPHAGEAL ECHOCARDIOGRAM N/A 02/28/2013   Procedure: INTRAOPERATIVE TRANSESOPHAGEAL ECHOCARDIOGRAM;  Surgeon: Rexene Alberts, MD;  Location: [place];  Service: Open Heart Surgery;  Laterality: N/A;  . LEFT HEART CATHETERIZATION WITH CORONARY ANGIOGRAM N/A 02/26/2013   Procedure: LEFT HEART CATHETERIZATION WITH CORONARY ANGIOGRAM;  Surgeon: Leonie Man, MD;  Location: [facility] CATH LAB;  Service: Cardiovascular;  Laterality: N/A;  . MAZE N/A 02/28/2013   Procedure: MAZE;  Surgeon: Rexene Alberts, MD;  Location: [place];  Service: Open Heart Surgery;  Laterality: N/A;  . MITRAL VALVE REPAIR N/A 02/28/2013   Procedure: MITRAL VALVE REPAIR (MVR);  Surgeon: Rexene Alberts, MD;  Location: [place];  Service: Open Heart Surgery;  Laterality: N/A;  . PROSTATE BIOPSY    . TRANSURETHRAL RESECTION OF PROSTATE N/A 02/04/2017   Procedure: TRANSURETHRAL RESECTION OF THE PROSTATE (TURP);  Surgeon: Alexis Frock, MD;  Location: [facility];  Service: Urology;  Laterality: N/A;     Family History: Family History  Problem Relation Age of Onset  . Hypertension Mother   . Sudden death Mother   . Stroke Mother   . Heart disease Mother   . Hypertension Father   . Sudden death Father   . Stroke Father   . Heart disease Father   . Heart disease Son   . Diabetes Son   . Lung cancer Brother   . Stomach cancer Brother   . Cancer Daughter        rebdomyosarcoma  . Kidney cancer Brother        mets  . Diabetes Sister     Social History: Social History   Socioeconomic History  . Marital status: Married    Spouse name: Mikle Bosworth  . Number  of children: 3  . Years of education: Not on file  . Highest education level: Not on file  Occupational History  . Occupation: Retired  Tobacco Use  . Smoking status: Never Smoker  . Smokeless tobacco: Never Used  Vaping Use  . Vaping Use: Never used  Substance and Sexual Activity  . Alcohol use: No    Alcohol/week: 0.0 standard drinks  . Drug use: No  . Sexual activity: Not Currently  Other Topics Concern  . Not on file  Social History Narrative   Married retired Biomedical scientist   Lives at [facility], independent living though his wife is in a wheelchair and they help each other   1 son lives in [place] a daughter lives in [place]   Never smoker no alcohol or drugs   Social Determinants of Health   Financial Resource Strain: Not on file  Food Insecurity: Not on file  Transportation Needs: Not on file  Physical Activity: Not  on file  Stress: Not on file  Social Connections: Not on file    Allergies: Allergies  Allergen Reactions  . Other Other (See Comments)    ANESTHESIA.... 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 ANESTHESIA.... 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 Occurred post op CABG 2015, required readmission and rehab    Outpatient Meds: Current Outpatient Medications  Medication Sig Dispense Refill  . amoxicillin (AMOXIL) 500 MG capsule Take 2,000 mg by mouth as directed. Prior to dental appointment  1  . atorvastatin (LIPITOR) 10 MG tablet TAKE ONE TABLET BY MOUTH DAILY AT 6PM 90 tablet 2  . Cholecalciferol (VITAMIN D3 PO) Take 2,000 Units by mouth daily.    . Cyanocobalamin 2500 MCG SUBL Place 1 tablet under the tongue daily.    . diphenoxylate-atropine (LOMOTIL) 2.5-0.025 MG tablet Take 1 tablet by mouth 4 (four) times daily. 120 tablet 5  . ferrous sulfate 325 (65 FE) MG tablet Take 1 tablet (325 mg total) by mouth daily. 90 tablet 3  . fish oil-omega-3 fatty acids 1000 MG capsule Take  1 g by mouth every morning.    [name] Leuprolide Acetate, 6 Month, (LUPRON) 45 MG injection Inject 45 mg into the muscle every 3 (three) months.     . levothyroxine (SYNTHROID) 50 MCG tablet TAKE ONE TABLET BY MOUTH EVERY MORNING 90 tablet 2  . metoprolol tartrate (LOPRESSOR) 25 MG tablet TAKE ONE TABLET BY MOUTH TWICE A DAY 180 tablet 3  . vitamin C (ASCORBIC ACID) 500 MG tablet Take 500 mg by mouth every morning.    . nitroGLYCERIN (NITROSTAT) 0.4 MG SL tablet Place 1 tablet (0.4 mg total) under the tongue every 5 (five) minutes as needed for chest pain. (Patient not taking: Reported on 06/16/2020) 25 tablet 3   No current facility-administered medications for this visit.      ___________________________________________________________________ Objective   Exam:  BP 110/60 (BP Location: Left Arm, Patient Position: Sitting, Cuff Size: Normal)   Pulse 60 Comment: irregular  Ht 5' 6.75" (1.695 m) Comment: height measured without shoes  Wt 163 lb 8 oz (74.2 kg)   BMI 25.80 kg/m  Wt Readings from Last 3 Encounters:  06/16/20 163 lb 8 oz (74.2 kg)  01/03/20 162 lb (73.5 kg)  12/14/19 162 lb 14.4 oz (73.9 kg)     General: Frail elderly man, uses a walker, able to get on exam table with some assistance.  Pleasant and conversational, somewhat hard of hearing.  Eyes: sclera anicteric, no redness  ENT: oral mucosa moist without lesions, no cervical or supraclavicular lymphadenopathy  CV: Irregular without murmur, S1/S2, no JVD, no peripheral edema  Resp: clear to auscultation bilaterally, normal RR and effort noted  GI: soft, no tenderness, with active bowel sounds. No guarding or palpable organomegaly noted.  Skin; warm and dry, no rash or jaundice noted  Neuro: awake, alert and oriented x 3. Normal gross motor function and fluent speech  Labs:  CBC Latest Ref Rng & Units 01/03/2020 12/04/2019 06/20/2019  WBC 3.8 - 10.8 Thousand/uL 4.6 4.6 4.8  Hemoglobin 13.2 - 17.1 g/dL 11.9(L)  11.4(L) 12.2(L)  Hematocrit 38.5 - 50.0 % 36.0(L) 35.4(L) 37.8(L)  Platelets 140 - 400 Thousand/uL 146 147(L) 131(L)   CMP Latest Ref Rng & Units 01/03/2020 12/04/2019 06/20/2019  Glucose 65 - 99 mg/dL 84 81 100(H)  BUN 7 - 25 mg/dL 22 19 26(H)  Creatinine 0.70 - 1.11 mg/dL 1.07 1.09 1.12  Sodium 135 -  146 mmol/L 143 140 141  Potassium 3.5 - 5.3 mmol/L 4.2 4.3 4.3  Chloride 98 - 110 mmol/L 107 106 108  CO2 20 - 32 mmol/L 28 30 26   Calcium 8.6 - 10.3 mg/dL 9.0 9.1 9.2  Total Protein 6.1 - 8.1 g/dL 6.1 6.6 6.8  Total Bilirubin 0.2 - 1.2 mg/dL 0.7 0.5 0.9  Alkaline Phos 38 - 126 U/L - 66 76  AST 10 - 35 U/L 14 18 16   ALT 9 - 46 U/L 8(L) 13 14    No recent labs or stool testing. (His daughter says he recently went to the urologist and had some blood testing done,?  PSA)  Assessment: Encounter Diagnoses  Name Primary?  . Iron deficiency anemia due to chronic blood loss Yes  . Chronic diarrhea    IDA from chronic GI blood loss, source unknown.  Incomplete EGD in 2020 as noted above.  Maintained on chronic oral iron supplement, last hemoglobin checked about 5 months ago.  Chronic diarrhea for several years, somewhat difficult to characterize.  His daughter says that he drinks a lot of milk with cereal and eats a lot of cheese, so I questioned lactose intolerance.  I recommended that he switch to lactose-free milk and avoid cheese and other dairy products for about a week to see if there is any improvement.   Infection for this long would be unlikely, but I still feel he should be ruled out for C. difficile and ova and parasites.  No clear risk factors for EPI, and weight probably stable as near as they could tell, but I decided to check a fecal elastase as well.  CBC and CMP today.  If no improvement with dietary change and no infection found, consider a trial of budesonide in case he might have microscopic colitis.  He is at high risk for complications of colonoscopy given his age and  condition.   Thank you for the courtesy of this consult.  Please call me with any questions or concerns.  Nelida Meuse III  CC: Referring provider noted above

## 2020-06-16 NOTE — Patient Instructions (Signed)
If you are age 85 or older, your body mass index should be between 23-30. Your Body mass index is 25.8 kg/m. If this is out of the aforementioned range listed, please consider follow up with your Primary Care Provider.  If you are age 25 or younger, your body mass index should be between 19-25. Your Body mass index is 25.8 kg/m. If this is out of the aformentioned range listed, please consider follow up with your Primary Care Provider.   Your provider has requested that you go to the basement level for lab work before leaving today. Press "B" on the elevator. The lab is located at the first door on the left as you exit the elevator.  Thank you for trusting me with your gastrointestinal care!    Dr. Wilfrid Lund

## 2020-06-18 ENCOUNTER — Telehealth: Payer: Self-pay | Admitting: Oncology

## 2020-06-18 NOTE — Telephone Encounter (Signed)
R/s appt per 4/27 sch msg. Pt aware.  

## 2020-06-20 ENCOUNTER — Inpatient Hospital Stay: Payer: Medicare HMO | Admitting: Oncology

## 2020-06-20 ENCOUNTER — Inpatient Hospital Stay: Payer: Medicare HMO

## 2020-07-07 ENCOUNTER — Emergency Department (HOSPITAL_COMMUNITY): Payer: Medicare HMO

## 2020-07-07 ENCOUNTER — Encounter (HOSPITAL_COMMUNITY): Payer: Self-pay | Admitting: *Deleted

## 2020-07-07 ENCOUNTER — Telehealth: Payer: Self-pay | Admitting: Gastroenterology

## 2020-07-07 ENCOUNTER — Emergency Department (HOSPITAL_COMMUNITY)
Admission: EM | Admit: 2020-07-07 | Discharge: 2020-07-07 | Disposition: A | Discharge status: Home / Self Care | Payer: Medicare HMO | Attending: Emergency Medicine | Admitting: Emergency Medicine

## 2020-07-07 DIAGNOSIS — Z951 Presence of aortocoronary bypass graft: Secondary | ICD-10-CM | POA: Insufficient documentation

## 2020-07-07 DIAGNOSIS — I2510 Atherosclerotic heart disease of native coronary artery without angina pectoris: Secondary | ICD-10-CM | POA: Diagnosis not present

## 2020-07-07 DIAGNOSIS — K439 Ventral hernia without obstruction or gangrene: Secondary | ICD-10-CM | POA: Diagnosis not present

## 2020-07-07 DIAGNOSIS — E039 Hypothyroidism, unspecified: Secondary | ICD-10-CM | POA: Diagnosis not present

## 2020-07-07 DIAGNOSIS — I110 Hypertensive heart disease with heart failure: Secondary | ICD-10-CM | POA: Diagnosis not present

## 2020-07-07 DIAGNOSIS — R009 Unspecified abnormalities of heart beat: Secondary | ICD-10-CM | POA: Insufficient documentation

## 2020-07-07 DIAGNOSIS — I5032 Chronic diastolic (congestive) heart failure: Secondary | ICD-10-CM | POA: Diagnosis not present

## 2020-07-07 DIAGNOSIS — R001 Bradycardia, unspecified: Secondary | ICD-10-CM | POA: Diagnosis not present

## 2020-07-07 DIAGNOSIS — K219 Gastro-esophageal reflux disease without esophagitis: Secondary | ICD-10-CM | POA: Diagnosis not present

## 2020-07-07 DIAGNOSIS — R1084 Generalized abdominal pain: Secondary | ICD-10-CM

## 2020-07-07 DIAGNOSIS — R0902 Hypoxemia: Secondary | ICD-10-CM | POA: Diagnosis not present

## 2020-07-07 DIAGNOSIS — Z8546 Personal history of malignant neoplasm of prostate: Secondary | ICD-10-CM | POA: Insufficient documentation

## 2020-07-07 DIAGNOSIS — K5730 Diverticulosis of large intestine without perforation or abscess without bleeding: Secondary | ICD-10-CM | POA: Diagnosis not present

## 2020-07-07 DIAGNOSIS — R197 Diarrhea, unspecified: Secondary | ICD-10-CM | POA: Diagnosis not present

## 2020-07-07 DIAGNOSIS — Z79899 Other long term (current) drug therapy: Secondary | ICD-10-CM | POA: Insufficient documentation

## 2020-07-07 DIAGNOSIS — K8020 Calculus of gallbladder without cholecystitis without obstruction: Secondary | ICD-10-CM | POA: Diagnosis not present

## 2020-07-07 DIAGNOSIS — I10 Essential (primary) hypertension: Secondary | ICD-10-CM | POA: Diagnosis not present

## 2020-07-07 DIAGNOSIS — Z743 Need for continuous supervision: Secondary | ICD-10-CM | POA: Diagnosis not present

## 2020-07-07 DIAGNOSIS — R1110 Vomiting, unspecified: Secondary | ICD-10-CM | POA: Diagnosis not present

## 2020-07-07 DIAGNOSIS — I4891 Unspecified atrial fibrillation: Secondary | ICD-10-CM | POA: Diagnosis not present

## 2020-07-07 LAB — CBC
HCT: 37 % — ABNORMAL LOW (ref 39.0–52.0)
Hemoglobin: 11.9 g/dL — ABNORMAL LOW (ref 13.0–17.0)
MCH: 30.6 pg (ref 26.0–34.0)
MCHC: 32.2 g/dL (ref 30.0–36.0)
MCV: 95.1 fL (ref 80.0–100.0)
Platelets: 157 10*3/uL (ref 150–400)
RBC: 3.89 MIL/uL — ABNORMAL LOW (ref 4.22–5.81)
RDW: 13.3 % (ref 11.5–15.5)
WBC: 6.2 10*3/uL (ref 4.0–10.5)
nRBC: 0 % (ref 0.0–0.2)

## 2020-07-07 LAB — URINALYSIS, ROUTINE W REFLEX MICROSCOPIC
Bilirubin Urine: NEGATIVE
Glucose, UA: NEGATIVE mg/dL
Ketones, ur: 5 mg/dL — AB
Leukocytes,Ua: NEGATIVE
Nitrite: NEGATIVE
Protein, ur: NEGATIVE mg/dL
Specific Gravity, Urine: 1.005 (ref 1.005–1.030)
pH: 7 (ref 5.0–8.0)

## 2020-07-07 LAB — COMPREHENSIVE METABOLIC PANEL
ALT: 23 U/L (ref 0–44)
AST: 25 U/L (ref 15–41)
Albumin: 3.5 g/dL (ref 3.5–5.0)
Alkaline Phosphatase: 55 U/L (ref 38–126)
Anion gap: 7 (ref 5–15)
BUN: 22 mg/dL (ref 8–23)
CO2: 23 mmol/L (ref 22–32)
Calcium: 8.9 mg/dL (ref 8.9–10.3)
Chloride: 107 mmol/L (ref 98–111)
Creatinine, Ser: 1.14 mg/dL (ref 0.61–1.24)
GFR, Estimated: >60 mL/min (ref >60)
Glucose, Bld: 104 mg/dL — ABNORMAL HIGH (ref 70–99)
Potassium: 4.3 mmol/L (ref 3.5–5.1)
Sodium: 137 mmol/L (ref 135–145)
Total Bilirubin: 0.8 mg/dL (ref 0.3–1.2)
Total Protein: 6.6 g/dL (ref 6.5–8.1)

## 2020-07-07 LAB — LIPASE, BLOOD: Lipase: 42 U/L (ref 11–51)

## 2020-07-07 LAB — LACTIC ACID, PLASMA: Lactic Acid, Venous: 1.1 mmol/L (ref 0.5–1.9)

## 2020-07-07 MED ORDER — SODIUM CHLORIDE 0.9 % IV BOLUS
1000.0000 mL | Freq: Once | INTRAVENOUS | Status: AC
  Administered 2020-07-07: 1000 mL via INTRAVENOUS

## 2020-07-07 MED ORDER — ONDANSETRON HCL 4 MG/2ML IJ SOLN
4.0000 mg | Freq: Once | INTRAMUSCULAR | Status: AC
  Administered 2020-07-07: 4 mg via INTRAVENOUS
  Filled 2020-07-07: qty 2

## 2020-07-07 MED ORDER — LOPERAMIDE HCL 2 MG PO CAPS: 2.0000 mg | ORAL_CAPSULE | Freq: Four times a day (QID) | ORAL | 0 refills | Status: DC | PRN

## 2020-07-07 NOTE — ED Provider Notes (Signed)
Emergency Medicine Provider Triage Evaluation Note  Luis Buba. , a 87 y.o. male  was evaluated in triage.  Pt complains of abd pain.  Review of Systems  Positive: Upper abd pain, nausea, vomiting, diarrhea Negative: Fever, dysuria  Physical Exam  BP 137/77 (BP Location: Left Arm)   Pulse (!) 113   Temp 98.8 F (37.1 C) (Oral)   Resp 14   Ht 5\' 9"  (1.753 m)   Wt 72.6 kg   SpO2 100%   BMI 23.63 kg/m  Gen:   Awake, no distress   Resp:  Normal effort  MSK:   Moves extremities without difficulty  Other:  Abdomen soft and nontender, BS active  Medical Decision Making  Medically screening exam initiated at 1:50 PM.  Appropriate orders placed.  Luis Buba. was informed that the remainder of the evaluation will be completed by another provider, this initial triage assessment does not replace that evaluation, and the importance of remaining in the ED until their evaluation is complete.  Developed upper abd pain last night with one episode of vomiting.  Reached to to PCP who recommended to come to ER.  Has had 3 episodes of loose stool since.  Abdominal pain has since resolved.    Luis Moras, PA-C 07/07/20 1352    Gareth Morgan, MD 07/09/20 [phone]

## 2020-07-07 NOTE — ED Notes (Signed)
Pt placed on primofit 

## 2020-07-07 NOTE — ED Notes (Signed)
Pt given water for PO chal.

## 2020-07-07 NOTE — Telephone Encounter (Signed)
Spoke with Delrae Rend, she states that patient tried the lactose-free dier but it hasn't seemed to help. She states that patient will have 1 episode of diarrhea every couple of days and when he does have an episode he won't go much. Following the diarrhea he will not have a BM for a couple of days. She reports that he has epigastric pain and they have noticed some swelling on the left side. Alisa also reports bloating, nausea, vomiting. She states that he is not eating or drinking much. No fever. She think that he may have a blockage. Advised that if he is not tolerating liquids the he will need to go to the ED because it is likely that he will get dehydrated if he is not getting enough nutrients.She states that she thinks he can tolerate liquids but doesn't want to try because of the discomfort. She states that she will take him to the ED if that is what Dr. Loletha Carrow recommends. Please advise, thanks.

## 2020-07-07 NOTE — Telephone Encounter (Signed)
Given his age, medical condition and reported symptoms, I agree there is concern for possible obstruction and he should be seen in the emergency department. Depending on what is found, he potentially needs admission to the internal medicine service for further observation or testing.   - HD

## 2020-07-07 NOTE — ED Notes (Signed)
RN and this tech changed patients brief. A new [place] was also placed.

## 2020-07-07 NOTE — ED Triage Notes (Signed)
Patient presents due to MD recommendation. He has had pain for 2 weeks, however he does not have pain today. He has had 3 episodes of diarrhea today.  HX: Afib, hypertension, diverticulitis, hyperthyroidism, prostate cancer  EMS vitals: 133/71 BP 56 HR 18 RR 121 CBG 97.7 Temp

## 2020-07-07 NOTE — Discharge Instructions (Signed)
Please read and follow all provided instructions.  Your diagnoses today include:  1. Generalized abdominal pain   2. Diarrhea, unspecified type     Tests performed today include: Blood cell counts and platelets Kidney and liver function tests Pancreas function test (called lipase) Urine test to look for infection - there is a small amount of blood in your urine, please have this rechecked by your doctor CT scan - does not show any new acute problems which would explain your pain.  No signs of bowel obstruction. Vital signs. See below for your results today.   Medications prescribed: Imodium -medication to slow diarrhea  Take any prescribed medications only as directed.  Home care instructions:  Follow any educational materials contained in this packet.  Follow-up instructions: Please follow-up with your primary care provider in the next 3 days for further evaluation of your symptoms.    Return instructions:  SEEK IMMEDIATE MEDICAL ATTENTION IF: The pain does not go away or becomes severe  A temperature above 101F develops  Repeated vomiting occurs (multiple episodes)  The pain becomes localized to portions of the abdomen. The right side could possibly be appendicitis. In an adult, the left lower portion of the abdomen could be colitis or diverticulitis.  Blood is being passed in stools or vomit (bright red or black tarry stools)  You develop chest pain, difficulty breathing, dizziness or fainting, or become confused, poorly responsive, or inconsolable (young children) If you have any other emergent concerns regarding your health  Additional Information: Abdominal (belly) pain can be caused by many things. Your caregiver performed an examination and possibly ordered blood/urine tests and imaging (CT scan, x-rays, ultrasound). Many cases can be observed and treated at home after initial evaluation in the emergency department. Even though you are being discharged home, abdominal  pain can be unpredictable. Therefore, you need a repeated exam if your pain does not resolve, returns, or worsens. Most patients with abdominal pain don't have to be admitted to the hospital or have surgery, but serious problems like appendicitis and gallbladder attacks can start out as nonspecific pain. Many abdominal conditions cannot be diagnosed in one visit, so follow-up evaluations are very important.  Your vital signs today were: BP (!) 156/74   Pulse 74   Temp 98.8 F (37.1 C) (Oral)   Resp 16   Ht 5\' 9"  (1.753 m)   Wt 72.6 kg   SpO2 98%   BMI 23.63 kg/m  If your blood pressure (bp) was elevated above 135/85 this visit, please have this repeated by your doctor within one month. --------------

## 2020-07-07 NOTE — ED Provider Notes (Signed)
[facility] DEPT Provider Note   CSN: [id number] Arrival date & time: 07/07/20  1320     History Chief Complaint  Patient presents with  . Abdominal Pain    [name]. is a 87 y.o. male.  Patient with history of afib, GI bleeding, hiatal hernia, prostate cancer, CAD s/p CABG -- presents to the ED for intermittent abdomen pain and diarrhea over the past couple of weeks. Currently no abd pain but severe pain overnight.  He has had a couple small episodes of vomiting after drinking a sports drink. He has had episodes of watery diarrhea, no blood recently but some black stool in the past. Abd pain is generalized, non-radiating. It comes on randomly, not triggered by eating.  No CP, SOB, fever. No urinary sx. Has a ureteral stent as well -- no other abd surgeries that he can remember. The onset of this condition was acute. The course is constant. Aggravating factors: none. Alleviating factors: none.          Past Medical History:  Diagnosis Date  . Complication of anesthesia    "serious cognisance problems since my OR in January" (03/13/2013  . Coronary artery disease   . Diverticulosis   . Hiatal hernia   . Hx of echocardiogram    Echo 5/16:  Mild focal basal septal hypertrophy, EF 55%, mild AI, MV repair ok with trivial MR (mean 3 mmHg), mild LAE, mild RAE, PASP 35 mmHg  . Hypertension   . Hypothyroidism   . Irritable bowel syndrome   . PAF (paroxysmal atrial fibrillation) ([place]) 05/03/2014  . Personal history of colonic polyps    tubular adenoma  . Pleural effusion, bilateral 03/12/2013   Small to moderate, L>R  . Prostate cancer [facility]) 1991   sees Dr. Carlota Raspberry at [facility], observation only   . S/P CABG x 2 02/28/2013   LIMA to LAD, SVG to OM1, EVH via right thigh  . S/P Maze operation for atrial fibrillation 02/28/2013   Complete bilateral atrial lesion set using bipolar radiofrequency and cryothermy ablation with clipping of LA appendage  .  S/P mitral valve repair, maze procedure, and CABG x2 02/28/2013   Complex valvuloplasty including triangular resection of posterior leaflet, 30 mm Sorin Memo 3D ring annuloplasty    Patient Active Problem List   Diagnosis Date Noted  . GI bleed 01/10/2019  . Melena   . Acute blood loss anemia   . Symptomatic anemia 01/09/2019  . Fall at home, initial encounter 01/09/2019  . Anemia due to multiple mechanisms 01/08/2019  . Stricture or kinking of ureter 12/06/2018  . Ureteral stricture, left 04/19/2018  . PVC's (premature ventricular contractions) 11/07/2017  . Urinary retention 02/04/2017  . PAF (paroxysmal atrial fibrillation), CHA2DS2VASc score 4 05/03/2014  . Dizziness 05/03/2014  . Encounter for therapeutic drug monitoring 05/07/2013  . S/P CABG x 2 03/26/2013  . S/P MVR (mitral valve repair) 03/26/2013  . S/P Maze operation for atrial fibrillation 03/26/2013  . Current use of long term anticoagulation 03/16/2013  . Chronic diastolic CHF (congestive heart failure) ([place]) 03/12/2013  . Pre-syncope 03/12/2013  . Pleural effusion, bilateral 03/12/2013  . Delirium 03/12/2013  . CAD (coronary artery disease) 03/06/2013  . S/P mitral valve repair, maze procedure, and CABG x2 02/29/12 02/28/2013  . Unstable angina ([place]) 02/26/2013  . Special screening for malignant neoplasms, colon 10/05/2010  . Benign neoplasm of colon 10/05/2010  . Diverticulosis of colon (without mention of hemorrhage) 10/05/2010  . ADENOCARCINOMA,  PROSTATE, GLEASON GRADE 3 08/13/2009  . SEBACEOUS CYST 08/13/2009  . Hypothyroidism 08/02/2007  . GERD 08/02/2007  . OSTEOPENIA 08/02/2007  . UNS ADVRS EFF OTH RX MEDICINAL&BIOLOGICAL SBSTNC 08/02/2007  . HLD (hyperlipidemia) 12/15/2006  . PROSTATE SPECIFIC ANTIGEN, ELEVATED 12/15/2006  . Essential hypertension 09/05/2006  . ARTHRITIS 09/05/2006  . COLONIC POLYPS, HX OF 09/05/2006    Past Surgical History:  Procedure Laterality Date  . CARDIAC CATHETERIZATION   2015  . CATARACT EXTRACTION, BILATERAL Bilateral 2014   per Dr. Gershon Crane   . COLONOSCOPY  2011   per Dr. Sharlett Iles, benign polyps, no repeats needed   . CORONARY ARTERY BYPASS GRAFT N/A 02/28/2013   Procedure: CORONARY ARTERY BYPASS GRAFTING (CABG);  Surgeon: Rexene Alberts, MD;  Location: [place];  Service: Open Heart Surgery;  Laterality: N/A;  CABG x 2,  using left internal mammary artery and right leg saphenous vein harvested endoscopically  . ESOPHAGOGASTRODUODENOSCOPY (EGD) WITH PROPOFOL N/A 01/11/2019   Procedure: ESOPHAGOGASTRODUODENOSCOPY (EGD) WITH PROPOFOL;  Surgeon: Doran Stabler, MD;  Location: [facility];  Service: Gastroenterology;  Laterality: N/A;  . HEMORRHOID SURGERY    . INTRAOPERATIVE TRANSESOPHAGEAL ECHOCARDIOGRAM N/A 02/28/2013   Procedure: INTRAOPERATIVE TRANSESOPHAGEAL ECHOCARDIOGRAM;  Surgeon: Rexene Alberts, MD;  Location: [place];  Service: Open Heart Surgery;  Laterality: N/A;  . LEFT HEART CATHETERIZATION WITH CORONARY ANGIOGRAM N/A 02/26/2013   Procedure: LEFT HEART CATHETERIZATION WITH CORONARY ANGIOGRAM;  Surgeon: Leonie Man, MD;  Location: [facility] CATH LAB;  Service: Cardiovascular;  Laterality: N/A;  . MAZE N/A 02/28/2013   Procedure: MAZE;  Surgeon: Rexene Alberts, MD;  Location: [place];  Service: Open Heart Surgery;  Laterality: N/A;  . MITRAL VALVE REPAIR N/A 02/28/2013   Procedure: MITRAL VALVE REPAIR (MVR);  Surgeon: Rexene Alberts, MD;  Location: [place];  Service: Open Heart Surgery;  Laterality: N/A;  . PROSTATE BIOPSY    . TRANSURETHRAL RESECTION OF PROSTATE N/A 02/04/2017   Procedure: TRANSURETHRAL RESECTION OF THE PROSTATE (TURP);  Surgeon: Alexis Frock, MD;  Location: [facility];  Service: Urology;  Laterality: N/A;       Family History  Problem Relation Age of Onset  . Hypertension Mother   . Sudden death Mother   . Stroke Mother   . Heart disease Mother   . Hypertension Father   . Sudden death Father   . Stroke Father   . Heart disease Father    . Heart disease Son   . Diabetes Son   . Lung cancer Brother   . Stomach cancer Brother   . Cancer Daughter        rebdomyosarcoma  . Kidney cancer Brother        mets  . Diabetes Sister     Social History   Tobacco Use  . Smoking status: Never Smoker  . Smokeless tobacco: Never Used  Vaping Use  . Vaping Use: Never used  Substance Use Topics  . Alcohol use: No    Alcohol/week: 0.0 standard drinks  . Drug use: No    Home Medications Prior to Admission medications   Medication Sig Start Date End Date Taking? Authorizing Provider  amoxicillin (AMOXIL) 500 MG capsule Take 2,000 mg by mouth as directed. Prior to dental appointment 07/30/14   [provider]  atorvastatin (LIPITOR) 10 MG tablet TAKE ONE TABLET BY MOUTH DAILY AT 6PM 01/31/20   [name], MD  Cholecalciferol (VITAMIN D3 PO) Take 2,000 Units by mouth daily.  [provider]  Cyanocobalamin 2500 MCG SUBL Place 1 tablet under the tongue daily.    [provider]  diphenoxylate-atropine (LOMOTIL) 2.5-0.025 MG tablet Take 1 tablet by mouth 4 (four) times daily. 05/29/20   [name], MD  ferrous sulfate 325 (65 FE) MG tablet Take 1 tablet (325 mg total) by mouth daily. 01/03/20 03/03/20  [name], MD  fish oil-omega-3 fatty acids 1000 MG capsule Take 1 g by mouth every morning.    [provider]  Leuprolide Acetate, 6 Month, (LUPRON) 45 MG injection Inject 45 mg into the muscle every 3 (three) months.     [provider]  levothyroxine (SYNTHROID) 50 MCG tablet TAKE ONE TABLET BY MOUTH EVERY MORNING 01/31/20   [name], MD  metoprolol tartrate (LOPRESSOR) 25 MG tablet TAKE ONE TABLET BY MOUTH TWICE A DAY 04/17/20   [name], MD  nitroGLYCERIN (NITROSTAT) 0.4 MG SL tablet Place 1 tablet (0.4 mg total) under the tongue every 5 (five) minutes as needed for chest pain. Patient not taking: Reported on 06/16/2020 03/17/15   Fay Records, MD  vitamin C (ASCORBIC  ACID) 500 MG tablet Take 500 mg by mouth every morning.    [provider]    Allergies    Other  Review of Systems   Review of Systems  Constitutional: Negative for fever.  HENT: Negative for rhinorrhea and sore throat.   Eyes: Negative for redness.  Respiratory: Negative for cough.   Cardiovascular: Negative for chest pain.  Gastrointestinal: Positive for abdominal pain, diarrhea, nausea and vomiting. Negative for blood in stool.  Genitourinary: Negative for dysuria and hematuria.  Musculoskeletal: Negative for myalgias.  Skin: Negative for rash.  Neurological: Negative for headaches.    Physical Exam Updated Vital Signs BP (!) 173/58   Pulse (!) 30   Temp 98.8 F (37.1 C) (Oral)   Resp 18   Ht 5\' 9"  (1.753 m)   Wt 72.6 kg   SpO2 100%   BMI 23.63 kg/m   Physical Exam Vitals and nursing note reviewed.  Constitutional:      Appearance: He is well-developed.  HENT:     Head: Normocephalic and atraumatic.  Eyes:     General:        Right eye: No discharge.        Left eye: No discharge.     Conjunctiva/sclera: Conjunctivae normal.  Cardiovascular:     Rate and Rhythm: Normal rate. Rhythm irregular.     Heart sounds: Normal heart sounds.  Pulmonary:     Effort: Pulmonary effort is normal.     Breath sounds: Normal breath sounds.  Abdominal:     Palpations: Abdomen is soft.     Tenderness: There is no abdominal tenderness (non-tender at time of exam).  Musculoskeletal:     Cervical back: Normal range of motion and neck supple.  Skin:    General: Skin is warm and dry.  Neurological:     Mental Status: He is alert.     ED Results / Procedures / Treatments   Labs (all labs ordered are listed, but only abnormal results are displayed) Labs Reviewed  COMPREHENSIVE METABOLIC PANEL - Abnormal; Notable for the following components:      Result Value   Glucose, Bld 104 (*)    All other components within normal limits  CBC - Abnormal; Notable for the  following components:   RBC 3.89 (*)    Hemoglobin 11.9 (*)  HCT 37.0 (*)    All other components within normal limits  URINALYSIS, ROUTINE W REFLEX MICROSCOPIC - Abnormal; Notable for the following components:   Color, Urine STRAW (*)    Hgb urine dipstick LARGE (*)    Ketones, ur 5 (*)    Bacteria, UA RARE (*)    All other components within normal limits  LIPASE, BLOOD  LACTIC ACID, PLASMA    ED ECG REPORT   Date: 07/07/2020  Rate: 82  Rhythm: atrial fibrillation, frequent PVCs  QRS Axis: normal  Intervals: normal  ST/T Wave abnormalities: nonspecific T wave changes  Conduction Disutrbances:none  Narrative Interpretation:   Old EKG Reviewed: unchanged from 01/11/2019  I have personally reviewed the EKG tracing and agree with the computerized printout as noted.  Radiology CT ABDOMEN PELVIS WO CONTRAST  Result Date: 07/07/2020 CLINICAL DATA:  Upper abdominal pain and vomiting. EXAM: CT ABDOMEN AND PELVIS WITHOUT CONTRAST TECHNIQUE: Multidetector CT imaging of the abdomen and pelvis was performed following the standard protocol without IV contrast. COMPARISON:  December 04, 2019 FINDINGS: Lower chest: Multiple sternal wires are seen. Mild atelectasis is seen within the bilateral lung bases. Hepatobiliary: No focal liver abnormality is seen. Multiple gallstones are seen without gallbladder wall thickening or biliary dilatation. Partial calcification of the gallbladder wall is suspected. This is seen on the prior study. Pancreas: Unremarkable. No pancreatic ductal dilatation or surrounding inflammatory changes. Spleen: Normal in size without focal abnormality. Adrenals/Urinary Tract: Adrenal glands are unremarkable. The left kidney is atrophic in appearance. Stable left-sided endo ureteral stent positioning is noted. The right kidney is normal in size, without renal calculi, focal lesion, or hydronephrosis. Bladder is unremarkable. Stomach/Bowel: A large, stable gastric hernia is seen.  Appendix appears normal. No evidence of bowel wall dilatation. Noninflamed diverticula are seen throughout the large bowel. This is most prominent within the sigmoid colon. Vascular/Lymphatic: Aortic atherosclerosis. No enlarged abdominal or pelvic lymph nodes. Reproductive: The prostate gland is surgically absent. Other: No abdominal wall hernia or abnormality. No abdominopelvic ascites. Musculoskeletal: A chronic compression fracture deformity is seen at the level of L5. Stable stepwise anterolisthesis is noted at the levels of L4-L5 and L5-S1. IMPRESSION: 1. Large, stable gastric hernia. 2. Stable left endo ureteral stent positioning. 3. Cholelithiasis with stable calcification of the gallbladder wall. 4. Colonic diverticulosis. 5. Chronic compression fracture deformity of the L5 vertebral body. 6. Aortic atherosclerosis. Electronically Signed   By: Virgina Norfolk M.D.   On: 07/07/2020 16:45    Procedures Procedures   Medications Ordered in ED Medications  sodium chloride 0.9 % bolus 1,000 mL (1,000 mLs Intravenous New Bag/Given 07/07/20 1555)  ondansetron (ZOFRAN) injection 4 mg (4 mg Intravenous Given 07/07/20 1720)    ED Course  I have reviewed the triage vital signs and the nursing notes.  Pertinent labs & imaging results that were available during my care of the patient were reviewed by me and considered in my medical decision making (see chart for details).  Patient seen and examined. Work-up initiated. Labs ordered so far are reassuring. Exam reassuring. Lactic acid, IV fluids ordered. Will obtain non-contrast CT.   This patient was evaluated during a time of global shortage of iodinated contrast media. Based on guidance from the [employer] of Radiology, best practices, and local institutional approaches an alternative path for evaluating and managing the patient may have been employed in order to provide optimal care during this shortage. The current situation has been discussed  with the patient.  Vital  signs reviewed and are as follows: BP (!) 173/58   Pulse (!) 30   Temp 98.8 F (37.1 C) (Oral)   Resp 18   Ht 5\' 9"  (1.753 m)   Wt 72.6 kg   SpO2 100%   BMI 23.63 kg/m   4:50 PM EKG reviewed, CT reviewed. Chronic changes noted. Significant atherosclerosis without signs of colitis/enteritis. Awaiting lactate. Will give zofran, PO challenge.   Patient tolerated water well.  I updated him on results.  They tried to contact daughter for ride home and did not receive an answer.  RN attempted to contact family as well.  Family friend is coming to pick him up from the emergency department.  Plan for discharged home, prescription given for Imodium.  Encourage PCP follow-up.  The patient was urged to return to the Emergency Department immediately with worsening of current symptoms, worsening abdominal pain, persistent vomiting, blood noted in stools, fever, or any other concerns. The patient verbalized understanding.     MDM Rules/Calculators/A&P                         Abd pain: Patient with diarrhea, intermittent abdominal pain over the past couple of weeks.  Sent to the ED today for the symptoms but also concern for small bowel obstruction.  Work-up today is reassuring.  CT does not show any acute findings.  White blood cell count is normal.  Considered mesenteric ischemia or ischemic colitis.  Lactate is normal.  Unable to obtain CT angio due to shortage of contrast dye, and I do not feel that mesenteric ischemia is likely given his current presentation, although he does have risk factors.  Patient looks well, nontoxic.  He is tolerating fluids without vomiting.  Plan for discharged home with close PCP/GI follow-up.   Final Clinical Impression(s) / ED Diagnoses Final diagnoses:  Generalized abdominal pain  Diarrhea, unspecified type    Rx / DC Orders ED Discharge Orders         Ordered    loperamide (IMODIUM) 2 MG capsule  4 times daily PRN        07/07/20  2105           Carlisle Cater, PA-C 07/07/20 2115    Davonna Belling, MD 07/07/20 2350

## 2020-07-07 NOTE — Telephone Encounter (Signed)
Inbound call from patient daugther, Tennis Must. States patient is having a bowel blockage. Diarrhea every couple days, then a couple days with no bowel movement, no appetite, abd cramping, dehydrated, IBS, hiatal hernia, would not get out of bed, hurts to sit. Requesting a call back [phone].

## 2020-07-07 NOTE — ED Notes (Signed)
Patient discharged with a family friend. AVS reviewed no concerns at this time.

## 2020-07-07 NOTE — Telephone Encounter (Signed)
Spoke with patient's daughter in regards to recommendations. EMS is currently at their home. She has been advised that he may need admission and internal medicine will complete the observation and will consult GI if needed. Alisa verbalized understanding.

## 2020-07-10 ENCOUNTER — Telehealth: Payer: Medicare HMO | Admitting: Family Medicine

## 2020-07-11 ENCOUNTER — Other Ambulatory Visit: Payer: Self-pay

## 2020-07-14 ENCOUNTER — Other Ambulatory Visit: Payer: Self-pay

## 2020-07-14 ENCOUNTER — Ambulatory Visit (INDEPENDENT_AMBULATORY_CARE_PROVIDER_SITE_OTHER): Payer: Medicare HMO | Admitting: Family Medicine

## 2020-07-14 ENCOUNTER — Encounter: Payer: Self-pay | Admitting: Family Medicine

## 2020-07-14 VITALS — BP 120/68 | HR 74 | Temp 98.4°F | Wt 159.0 lb

## 2020-07-14 DIAGNOSIS — R1084 Generalized abdominal pain: Secondary | ICD-10-CM

## 2020-07-14 NOTE — Progress Notes (Signed)
   Subjective:    Patient ID: [name]., male    DOB: 1927/08/04, 87 y.o.   MRN: [id number]  HPI Here with his daughter to follow up an ER visit on 07-07-20 for generalized abdominal pain and diarrhea over the previous 2 weeks. He had some nausea as well, and vomited once. No fever. Prior to that he met with Dr. Loletha Carrow on 06-16-20 and he suggested Fritz Pickerel avoid all dairy products or drink lactose-free milk, which he has done. At the ER his labs were normal, including a WBC of 6.2. A non-contrasted CT was unremarkable. He was given IV fluid, and while he was there he passed several small stools. Since then he has felt fine, with no pain or nausea, and he is passing regular BMs. His daughter is trying to get him to drink more water, with some success. He has stopped taking any Lomotil.    Review of Systems  Constitutional: Negative.   Respiratory: Negative.   Cardiovascular: Negative.   Gastrointestinal: Negative.   Genitourinary: Negative.        Objective:   Physical Exam Constitutional:      Appearance: Normal appearance.     Comments: Walks with a walker   Cardiovascular:     Rate and Rhythm: Normal rate and regular rhythm.     Pulses: Normal pulses.     Heart sounds: Normal heart sounds.  Pulmonary:     Effort: Pulmonary effort is normal.     Breath sounds: Normal breath sounds.  Abdominal:     General: Abdomen is flat. Bowel sounds are normal. There is no distension.     Palpations: Abdomen is soft. There is no mass.     Tenderness: There is no abdominal tenderness. There is no guarding or rebound.     Hernia: No hernia is present.  Neurological:     General: No focal deficit present.     Mental Status: He is alert and oriented to person, place, and time.           Assessment & Plan:  His abdominal pain has resolved, and this was likely due to some constipation and dehydration. He will continue to drink plenty of water, avoid lactose products, and avoid taking  Lomotil. Follow up as needed. We spent 50 minutes together reviewing records and discussing these issues.  Alysia Penna, MD

## 2020-07-17 ENCOUNTER — Telehealth: Payer: Self-pay | Admitting: Oncology

## 2020-07-17 NOTE — Telephone Encounter (Signed)
Patients daughter called in to change 6/2 appt. They can not do afternoons. Moved appt to 6/1. Confirmed with daughter

## 2020-07-23 ENCOUNTER — Inpatient Hospital Stay (HOSPITAL_BASED_OUTPATIENT_CLINIC_OR_DEPARTMENT_OTHER): Payer: Medicare HMO | Admitting: Oncology

## 2020-07-23 ENCOUNTER — Inpatient Hospital Stay: Payer: Medicare HMO | Attending: Oncology

## 2020-07-23 ENCOUNTER — Other Ambulatory Visit: Payer: Self-pay

## 2020-07-23 VITALS — BP 110/60 | HR 59 | Temp 97.6°F | Resp 18 | Ht 69.0 in | Wt 160.2 lb

## 2020-07-23 DIAGNOSIS — Z923 Personal history of irradiation: Secondary | ICD-10-CM | POA: Insufficient documentation

## 2020-07-23 DIAGNOSIS — D649 Anemia, unspecified: Secondary | ICD-10-CM | POA: Insufficient documentation

## 2020-07-23 DIAGNOSIS — Z191 Hormone sensitive malignancy status: Secondary | ICD-10-CM | POA: Diagnosis not present

## 2020-07-23 DIAGNOSIS — K5730 Diverticulosis of large intestine without perforation or abscess without bleeding: Secondary | ICD-10-CM | POA: Diagnosis not present

## 2020-07-23 DIAGNOSIS — C61 Malignant neoplasm of prostate: Secondary | ICD-10-CM | POA: Insufficient documentation

## 2020-07-23 DIAGNOSIS — I700 Atherosclerosis of aorta: Secondary | ICD-10-CM | POA: Diagnosis not present

## 2020-07-23 DIAGNOSIS — M810 Age-related osteoporosis without current pathological fracture: Secondary | ICD-10-CM | POA: Diagnosis not present

## 2020-07-23 DIAGNOSIS — M4856XA Collapsed vertebra, not elsewhere classified, lumbar region, initial encounter for fracture: Secondary | ICD-10-CM | POA: Insufficient documentation

## 2020-07-23 DIAGNOSIS — N1330 Unspecified hydronephrosis: Secondary | ICD-10-CM | POA: Diagnosis not present

## 2020-07-23 DIAGNOSIS — K8020 Calculus of gallbladder without cholecystitis without obstruction: Secondary | ICD-10-CM | POA: Insufficient documentation

## 2020-07-23 DIAGNOSIS — Z79899 Other long term (current) drug therapy: Secondary | ICD-10-CM | POA: Insufficient documentation

## 2020-07-23 LAB — CBC WITH DIFFERENTIAL (CANCER CENTER ONLY)
Abs Immature Granulocytes: 0.01 10*3/uL (ref 0.00–0.07)
Basophils Absolute: 0 10*3/uL (ref 0.0–0.1)
Basophils Relative: 0 %
Eosinophils Absolute: 0.1 10*3/uL (ref 0.0–0.5)
Eosinophils Relative: 2 %
HCT: 34 % — ABNORMAL LOW (ref 39.0–52.0)
Hemoglobin: 11.3 g/dL — ABNORMAL LOW (ref 13.0–17.0)
Immature Granulocytes: 0 %
Lymphocytes Relative: 15 %
Lymphs Abs: 0.7 10*3/uL (ref 0.7–4.0)
MCH: 30.6 pg (ref 26.0–34.0)
MCHC: 33.2 g/dL (ref 30.0–36.0)
MCV: 92.1 fL (ref 80.0–100.0)
Monocytes Absolute: 0.5 10*3/uL (ref 0.1–1.0)
Monocytes Relative: 10 %
Neutro Abs: 3.4 10*3/uL (ref 1.7–7.7)
Neutrophils Relative %: 73 %
Platelet Count: 149 10*3/uL — ABNORMAL LOW (ref 150–400)
RBC: 3.69 MIL/uL — ABNORMAL LOW (ref 4.22–5.81)
RDW: 13.6 % (ref 11.5–15.5)
WBC Count: 4.7 10*3/uL (ref 4.0–10.5)
nRBC: 0 % (ref 0.0–0.2)

## 2020-07-23 LAB — CMP (CANCER CENTER ONLY)
ALT: 30 U/L (ref 0–44)
AST: 23 U/L (ref 15–41)
Albumin: 3.1 g/dL — ABNORMAL LOW (ref 3.5–5.0)
Alkaline Phosphatase: 57 U/L (ref 38–126)
Anion gap: 11 (ref 5–15)
BUN: 19 mg/dL (ref 8–23)
CO2: 22 mmol/L (ref 22–32)
Calcium: 9 mg/dL (ref 8.9–10.3)
Chloride: 108 mmol/L (ref 98–111)
Creatinine: 1.11 mg/dL (ref 0.61–1.24)
GFR, Estimated: >60 mL/min (ref >60)
Glucose, Bld: 88 mg/dL (ref 70–99)
Potassium: 4.3 mmol/L (ref 3.5–5.1)
Sodium: 141 mmol/L (ref 135–145)
Total Bilirubin: 0.6 mg/dL (ref 0.3–1.2)
Total Protein: 6.2 g/dL — ABNORMAL LOW (ref 6.5–8.1)

## 2020-07-23 NOTE — Progress Notes (Signed)
Hematology and Oncology Follow Up Visit  [name] [id number] 1928/01/25 87 y.o. 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  He initially presented with localized disease in the year 2011 with a Gleason score 3+4 equal 7.  Prior Therapy:  He was on active surveillance between 2011 and 2018.  He developed relapsed disease with pelvic mass arising from the prostate and was started on Lupron at that time.  He is status post radiation therapy total of 44 Gy in 20 fractions to the pelvic nodes as well as boost to the prostate of 60 Gy 20 fractions.  I was completed in October 2019.  Current therapy:  Androgen deprivation under the care of [facility] in [facility].  His PSA is undetectable.  Interim History: Mr. Wafer is here for repeat evaluation.  Since the last visit, he reports no major changes in his health.  He was seen in the emergency department on Jul 07, 2020 for abdominal pain.  CT scan at that time did not show any evidence of obstruction or malignancy and his symptoms resolved after hydration.  He continues to have loose bowel habits periodically and occasional diarrhea.  His performance status and quality of life remains unchanged otherwise.  He still has reasonable mobility using a walker without any falls or syncope.       Medications: Updated on review. Current Outpatient Medications  Medication Sig Dispense Refill  . atorvastatin (LIPITOR) 10 MG tablet TAKE ONE TABLET BY MOUTH DAILY AT 6PM 90 tablet 2  . amoxicillin (AMOXIL) 500 MG capsule Take 2,000 mg by mouth as directed. Prior to dental appointment (Patient not taking: Reported on 07/23/2020)  1  . Cholecalciferol (VITAMIN D3 PO) Take 2,000 Units by mouth daily.    . Cyanocobalamin 2500 MCG SUBL Place 1 tablet under the tongue daily.    . diphenoxylate-atropine (LOMOTIL)  2.5-0.025 MG tablet Take 1 tablet by mouth 4 (four) times daily. (Patient not taking: Reported on 07/23/2020) 120 tablet 5  . ferrous sulfate 325 (65 FE) MG tablet Take 1 tablet (325 mg total) by mouth daily. 90 tablet 3  . fish oil-omega-3 fatty acids 1000 MG capsule Take 1 g by mouth every morning.    [name] Leuprolide Acetate, 6 Month, (LUPRON) 45 MG injection Inject 45 mg into the muscle every 3 (three) months.     . levothyroxine (SYNTHROID) 50 MCG tablet TAKE ONE TABLET BY MOUTH EVERY MORNING 90 tablet 2  . loperamide (IMODIUM) 2 MG capsule Take 1 capsule (2 mg total) by mouth 4 (four) times daily as needed for diarrhea or loose stools. (Patient not taking: Reported on 07/23/2020) 12 capsule 0  . metoprolol tartrate (LOPRESSOR) 25 MG tablet TAKE ONE TABLET BY MOUTH TWICE A DAY 180 tablet 3  . nitroGLYCERIN (NITROSTAT) 0.4 MG SL tablet Place 1 tablet (0.4 mg total) under the tongue every 5 (five) minutes as needed for chest pain. (Patient not taking: Reported on 07/23/2020) 25 tablet 3  . vitamin C (ASCORBIC ACID) 500 MG tablet Take 500 mg by mouth every morning.     No current facility-administered medications for this visit.     Allergies:  Allergies  Allergen Reactions  . Other Other (See Comments)    ANESTHESIA.... 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 ANESTHESIA.... 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 Occurred post op CABG  2015, required readmission and rehab      Physical Exam:   Blood pressure 110/60, pulse (!) 59, temperature 97.6 F (36.4 C), temperature source Tympanic, resp. rate 18, height 5\' 9"  (1.753 m), weight 160 lb 3.2 oz (72.7 kg), SpO2 100 %.     ECOG:  1   General appearance: Alert, awake without any distress. Head: Atraumatic without abnormalities Oropharynx: Without any thrush or ulcers. Eyes: No scleral icterus. Lymph nodes: No lymphadenopathy noted in the cervical, supraclavicular, or axillary nodes Heart:regular rate  and rhythm, without any murmurs or gallops.   Lung: Clear to auscultation without any rhonchi, wheezes or dullness to percussion. Abdomin: Soft, nontender without any shifting dullness or ascites. Musculoskeletal: No clubbing or cyanosis. Neurological: No motor or sensory deficits. Skin: No rashes or lesions.         Lab Results: Lab Results  Component Value Date   WBC 4.7 07/23/2020   HGB 11.3 (L) 07/23/2020   HCT 34.0 (L) 07/23/2020   MCV 92.1 07/23/2020   PLT 149 (L) 07/23/2020     Chemistry      Component Value Date/Time   NA 141 07/23/2020 0911   K 4.3 07/23/2020 0911   CL 108 07/23/2020 0911   CO2 22 07/23/2020 0911   BUN 19 07/23/2020 0911   CREATININE 1.11 07/23/2020 0911   CREATININE 1.07 01/03/2020 1149      Component Value Date/Time   CALCIUM 9.0 07/23/2020 0911   ALKPHOS 57 07/23/2020 0911   AST 23 07/23/2020 0911   ALT 30 07/23/2020 0911   BILITOT 0.6 07/23/2020 0911     .IMPRESSION: 1. Large, stable gastric hernia. 2. Stable left endo ureteral stent positioning. 3. Cholelithiasis with stable calcification of the gallbladder wall. 4. Colonic diverticulosis. 5. Chronic compression fracture deformity of the L5 vertebral body. 6. Aortic atherosclerosis.   Impression and Plan:     87 year old man with:  1.    Castration-sensitive advanced prostate cancer with locally advanced disease in the pelvis noted in 2018.   He is status post radiation therapy and currently on androgen deprivation therapy.  CT scan obtained on Jul 07, 2020 was personally reviewed and showed no evidence of metastatic disease.  His PSA is pending from today but has been undetectable.  Treatment options moving forward were discussed.  Therapy escalation with Nicki Reaper and systemic chemotherapy were reviewed and has been deferred at this time based on multiple factors including comorbidities, multiple medications as well as overall preference.  I recommended continued  active surveillance in addition to androgen deprivation and repeat imaging studies in 12 months.  This will be done sooner if needed 2.   2.  Androgen deprivation: I recommended continuing this indefinitely.  He is currently receiving that with his urologist every 6 months.  3.   Osteoporosis: I recommended continuing calcium and vitamin D supplement.  4.  Hydronephrosis: Related to his malignancy.  His kidney function remains adequate with CT scan imaging showed no hydronephrosis.[name]  5.  Anemia: Appears to be mild and stable at this time.  This is related to malignancy and prior radiation.  We will continue to follow at this time.  6.  Follow-up: He will return in 6 months for repeat evaluation.  30  minutes were dedicated to this visit.  The time was spent on reviewing disease status, discussing treatment options and future plan of care review.     [name], MD 6/1/202210:00 AM

## 2020-07-24 ENCOUNTER — Inpatient Hospital Stay: Payer: Medicare HMO | Admitting: Oncology

## 2020-07-24 ENCOUNTER — Inpatient Hospital Stay: Payer: Medicare HMO

## 2020-07-24 ENCOUNTER — Telehealth: Payer: Self-pay | Admitting: *Deleted

## 2020-07-24 LAB — PROSTATE-SPECIFIC AG, SERUM (LABCORP): Prostate Specific Ag, Serum: <0.1 ng/mL (ref 0.0–4.0)

## 2020-07-24 NOTE — Telephone Encounter (Signed)
Notified of results below 

## 2020-07-24 NOTE — Telephone Encounter (Signed)
-----   Message from Wyatt Portela, MD sent at 07/24/2020  8:50 AM EDT ----- Please let him know his PSA is still low

## 2020-07-25 ENCOUNTER — Ambulatory Visit: Payer: Medicare HMO | Admitting: Oncology

## 2020-07-25 ENCOUNTER — Other Ambulatory Visit: Payer: Self-pay

## 2020-08-04 DIAGNOSIS — Z961 Presence of intraocular lens: Secondary | ICD-10-CM | POA: Diagnosis not present

## 2020-08-04 DIAGNOSIS — H26492 Other secondary cataract, left eye: Secondary | ICD-10-CM | POA: Diagnosis not present

## 2020-08-06 DIAGNOSIS — H26492 Other secondary cataract, left eye: Secondary | ICD-10-CM | POA: Diagnosis not present

## 2020-08-21 ENCOUNTER — Ambulatory Visit: Payer: Medicare HMO

## 2020-08-26 DIAGNOSIS — C61 Malignant neoplasm of prostate: Secondary | ICD-10-CM | POA: Diagnosis not present

## 2020-08-27 ENCOUNTER — Ambulatory Visit: Payer: Medicare HMO

## 2020-08-27 ENCOUNTER — Other Ambulatory Visit: Payer: Self-pay

## 2020-09-03 DIAGNOSIS — N35811 Other urethral stricture, male, meatal: Secondary | ICD-10-CM | POA: Diagnosis not present

## 2020-09-03 DIAGNOSIS — N35911 Unspecified urethral stricture, male, meatal: Secondary | ICD-10-CM | POA: Diagnosis not present

## 2020-09-03 DIAGNOSIS — I10 Essential (primary) hypertension: Secondary | ICD-10-CM | POA: Diagnosis not present

## 2020-09-03 DIAGNOSIS — C61 Malignant neoplasm of prostate: Secondary | ICD-10-CM | POA: Diagnosis not present

## 2020-09-03 DIAGNOSIS — N132 Hydronephrosis with renal and ureteral calculous obstruction: Secondary | ICD-10-CM | POA: Diagnosis not present

## 2020-09-03 DIAGNOSIS — N135 Crossing vessel and stricture of ureter without hydronephrosis: Secondary | ICD-10-CM | POA: Diagnosis not present

## 2020-09-10 ENCOUNTER — Other Ambulatory Visit: Payer: Self-pay

## 2020-09-10 ENCOUNTER — Ambulatory Visit (INDEPENDENT_AMBULATORY_CARE_PROVIDER_SITE_OTHER): Payer: Medicare HMO

## 2020-09-10 DIAGNOSIS — Z0000 Encounter for general adult medical examination without abnormal findings: Secondary | ICD-10-CM

## 2020-09-10 NOTE — Patient Instructions (Signed)
Mr. [name] , Thank you for taking time to come for your Medicare Wellness Visit. I appreciate your ongoing commitment to your health goals. Please review the following plan we discussed and let me know if I can assist you in the future.   Screening recommendations/referrals: Colonoscopy: No longer required Recommended yearly ophthalmology/optometry visit for glaucoma screening and checkup Recommended yearly dental visit for hygiene and checkup  Vaccinations: Influenza vaccine: Due 09/22/20 Pneumococcal vaccine: Completed  Tdap vaccine: Done 01/22/14 repeat in 10 years 01/23/24 Shingles vaccine: Shingrix discussed. Please contact your pharmacy for coverage information.    Covid-19: Completed 02/26/19 & 03/26/19  Advanced directives: Please bring a copy of your health care power of attorney and living will to the office at your convenience.  Conditions/risks identified: None at this time  Next appointment: Follow up in one year for your annual wellness visit.   Preventive Care 12 Years and Older, Male Preventive care refers to lifestyle choices and visits with your health care provider that can promote health and wellness. What does preventive care include? A yearly physical exam. This is also called an annual well check. Dental exams once or twice a year. Routine eye exams. Ask your health care provider how often you should have your eyes checked. Personal lifestyle choices, including: Daily care of your teeth and gums. Regular physical activity. Eating a healthy diet. Avoiding tobacco and drug use. Limiting alcohol use. Practicing safe sex. Taking low doses of aspirin every day. Taking vitamin and mineral supplements as recommended by your health care provider. What happens during an annual well check? The services and screenings done by your health care provider during your annual well check will depend on your age, overall health, lifestyle risk factors, and family history of  disease. Counseling  Your health care provider may ask you questions about your: Alcohol use. Tobacco use. Drug use. Emotional well-being. Home and relationship well-being. Sexual activity. Eating habits. History of falls. Memory and ability to understand (cognition). Work and work Statistician. Screening  You may have the following tests or measurements: Height, weight, and BMI. Blood pressure. Lipid and cholesterol levels. These may be checked every 5 years, or more frequently if you are over 59 years old. Skin check. Lung cancer screening. You may have this screening every year starting at age 58 if you have a 30-pack-year history of smoking and currently smoke or have quit within the past 15 years. Fecal occult blood test (FOBT) of the stool. You may have this test every year starting at age 77. Flexible sigmoidoscopy or colonoscopy. You may have a sigmoidoscopy every 5 years or a colonoscopy every 10 years starting at age 23. Prostate cancer screening. Recommendations will vary depending on your family history and other risks. Hepatitis C blood test. Hepatitis B blood test. Sexually transmitted disease (STD) testing. Diabetes screening. This is done by checking your blood sugar (glucose) after you have not eaten for a while (fasting). You may have this done every 1-3 years. Abdominal aortic aneurysm (AAA) screening. You may need this if you are a current or former smoker. Osteoporosis. You may be screened starting at age 29 if you are at high risk. Talk with your health care provider about your test results, treatment options, and if necessary, the need for more tests. Vaccines  Your health care provider may recommend certain vaccines, such as: Influenza vaccine. This is recommended every year. Tetanus, diphtheria, and acellular pertussis (Tdap, Td) vaccine. You may need a Td booster every 10 years.  Zoster vaccine. You may need this after age 60. Pneumococcal 13-valent  conjugate (PCV13) vaccine. One dose is recommended after age 65. Pneumococcal polysaccharide (PPSV23) vaccine. One dose is recommended after age 97. Talk to your health care provider about which screenings and vaccines you need and how often you need them. This information is not intended to replace advice given to you by your health care provider. Make sure you discuss any questions you have with your health care provider. Document Released: 03/07/2015 Document Revised: 10/29/2015 Document Reviewed: 12/10/2014 Elsevier Interactive Patient Education  2017 [place] Prevention in the Home Falls can cause injuries. They can happen to people of all ages. There are many things you can do to make your home safe and to help prevent falls. What can I do on the outside of my home? Regularly fix the edges of walkways and driveways and fix any cracks. Remove anything that might make you trip as you walk through a door, such as a raised step or threshold. Trim any bushes or trees on the path to your home. Use bright outdoor lighting. Clear any walking paths of anything that might make someone trip, such as rocks or tools. Regularly check to see if handrails are loose or broken. Make sure that both sides of any steps have handrails. Any raised decks and porches should have guardrails on the edges. Have any leaves, snow, or ice cleared regularly. Use sand or salt on walking paths during winter. Clean up any spills in your garage right away. This includes oil or grease spills. What can I do in the bathroom? Use night lights. Install grab bars by the toilet and in the tub and shower. Do not use towel bars as grab bars. Use non-skid mats or decals in the tub or shower. If you need to sit down in the shower, use a plastic, non-slip stool. Keep the floor dry. Clean up any water that spills on the floor as soon as it happens. Remove soap buildup in the tub or shower regularly. Attach bath mats  securely with double-sided non-slip rug tape. Do not have throw rugs and other things on the floor that can make you trip. What can I do in the bedroom? Use night lights. Make sure that you have a light by your bed that is easy to reach. Do not use any sheets or blankets that are too big for your bed. They should not hang down onto the floor. Have a firm chair that has side arms. You can use this for support while you get dressed. Do not have throw rugs and other things on the floor that can make you trip. What can I do in the kitchen? Clean up any spills right away. Avoid walking on wet floors. Keep items that you use a lot in easy-to-reach places. If you need to reach something above you, use a strong step stool that has a grab bar. Keep electrical cords out of the way. Do not use floor polish or wax that makes floors slippery. If you must use wax, use non-skid floor wax. Do not have throw rugs and other things on the floor that can make you trip. What can I do with my stairs? Do not leave any items on the stairs. Make sure that there are handrails on both sides of the stairs and use them. Fix handrails that are broken or loose. Make sure that handrails are as long as the stairways. Check any carpeting to make sure that  it is firmly attached to the stairs. Fix any carpet that is loose or worn. Avoid having throw rugs at the top or bottom of the stairs. If you do have throw rugs, attach them to the floor with carpet tape. Make sure that you have a light switch at the top of the stairs and the bottom of the stairs. If you do not have them, ask someone to add them for you. What else can I do to help prevent falls? Wear shoes that: Do not have high heels. Have rubber bottoms. Are comfortable and fit you well. Are closed at the toe. Do not wear sandals. If you use a stepladder: Make sure that it is fully opened. Do not climb a closed stepladder. Make sure that both sides of the stepladder  are locked into place. Ask someone to hold it for you, if possible. Clearly mark and make sure that you can see: Any grab bars or handrails. First and last steps. Where the edge of each step is. Use tools that help you move around (mobility aids) if they are needed. These include: Canes. Walkers. Scooters. Crutches. Turn on the lights when you go into a dark area. Replace any light bulbs as soon as they burn out. Set up your furniture so you have a clear path. Avoid moving your furniture around. If any of your floors are uneven, fix them. If there are any pets around you, be aware of where they are. Review your medicines with your doctor. Some medicines can make you feel dizzy. This can increase your chance of falling. Ask your doctor what other things that you can do to help prevent falls. This information is not intended to replace advice given to you by your health care provider. Make sure you discuss any questions you have with your health care provider. Document Released: 12/05/2008 Document Revised: 07/17/2015 Document Reviewed: 03/15/2014 Elsevier Interactive Patient Education  2017 [employer].

## 2020-09-10 NOTE — Progress Notes (Addendum)
Virtual Visit via Telephone Note  I connected with  [name]. on 09/10/20 at 11:00 AM EDT by telephone and verified that I am speaking with the correct person using two identifiers.  Medicare Annual Wellness visit completed telephonically due to Covid-19 pandemic.   Persons participating in this call: This Health Coach and this patient.   Location: Patient: Home Provider: Office   I discussed the limitations, risks, security and privacy concerns of performing an evaluation and management service by telephone and the availability of in person appointments. The patient expressed understanding and agreed to proceed.  Unable to perform video visit due to video visit attempted and failed and/or patient does not have video capability.   Some vital signs may be absent or patient reported.   Willette Brace, LPN   Subjective:   [name]. is a 87 y.o. male who presents for an Initial Medicare Annual Wellness Visit.  Review of Systems     Cardiac Risk Factors include: advanced age (>52men, >1 women);hypertension;male gender     Objective:    There were no vitals filed for this visit. There is no height or weight on file to calculate BMI.  Advanced Directives 09/10/2020 01/16/2019 01/09/2019 01/09/2019 09/14/2018 04/23/2018 01/18/2018  Does Patient Have a Medical Advance Directive? Yes Yes Yes Yes Yes Yes Yes  Type of Advance Directive Living will [place];Living will [place];Living will Healthcare Power of [place]  Does patient want to make changes to medical advance directive? - No - Patient declined No - Patient declined Yes (ED - Information included in AVS) - - No - Patient declined  Copy of Healthcare Power of Attorney in Chart? - No - copy requested No - copy requested - - No - copy requested No - copy requested  Would patient like information on creating a  medical advance directive? - - - - - - -  Pre-existing out of facility DNR order (yellow form or pink MOST form) - - - - - - -    Current Medications (verified) Outpatient Encounter Medications as of 09/10/2020  Medication Sig   amoxicillin (AMOXIL) 500 MG capsule Take 2,000 mg by mouth as directed. Prior to dental appointment   atorvastatin (LIPITOR) 10 MG tablet TAKE ONE TABLET BY MOUTH DAILY AT 6PM   Cholecalciferol (VITAMIN D3 PO) Take 2,000 Units by mouth daily.   Cyanocobalamin 2500 MCG SUBL Place 1 tablet under the tongue daily.   fish oil-omega-3 fatty acids 1000 MG capsule Take 1 g by mouth every morning.   Leuprolide Acetate, 6 Month, (LUPRON) 45 MG injection Inject 45 mg into the muscle every 3 (three) months.    levothyroxine (SYNTHROID) 50 MCG tablet TAKE ONE TABLET BY MOUTH EVERY MORNING   metoprolol tartrate (LOPRESSOR) 25 MG tablet TAKE ONE TABLET BY MOUTH TWICE A DAY   vitamin C (ASCORBIC ACID) 500 MG tablet Take 500 mg by mouth every morning.   diphenoxylate-atropine (LOMOTIL) 2.5-0.025 MG tablet Take 1 tablet by mouth 4 (four) times daily. (Patient not taking: No sig reported)   ferrous sulfate 325 (65 FE) MG tablet Take 1 tablet (325 mg total) by mouth daily.   loperamide (IMODIUM) 2 MG capsule Take 1 capsule (2 mg total) by mouth 4 (four) times daily as needed for diarrhea or loose stools. (Patient not taking: No sig reported)   nitroGLYCERIN (NITROSTAT) 0.4 MG SL tablet Place 1 tablet (  0.4 mg total) under the tongue every 5 (five) minutes as needed for chest pain. (Patient not taking: No sig reported)   No facility-administered encounter medications on file as of 09/10/2020.    Allergies (verified) Other   History: Past Medical History:  Diagnosis Date   Complication of anesthesia    "serious cognisance problems since my OR in January" (03/13/2013   Coronary artery disease    Diverticulosis    Hiatal hernia    Hx of echocardiogram    Echo 5/16:  Mild focal basal  septal hypertrophy, EF 55%, mild AI, MV repair ok with trivial MR (mean 3 mmHg), mild LAE, mild RAE, PASP 35 mmHg   Hypertension    Hypothyroidism    Irritable bowel syndrome    PAF (paroxysmal atrial fibrillation) ([place]) 05/03/2014   Personal history of colonic polyps    tubular adenoma   Pleural effusion, bilateral 03/12/2013   Small to moderate, L>R   Prostate cancer [facility]) 1991   sees Dr. Carlota Raspberry at [facility], observation only    S/P CABG x 2 02/28/2013   LIMA to LAD, SVG to OM1, EVH via right thigh   S/P Maze operation for atrial fibrillation 02/28/2013   Complete bilateral atrial lesion set using bipolar radiofrequency and cryothermy ablation with clipping of LA appendage   S/P mitral valve repair, maze procedure, and CABG x2 02/28/2013   Complex valvuloplasty including triangular resection of posterior leaflet, 30 mm Sorin Memo 3D ring annuloplasty   Past Surgical History:  Procedure Laterality Date   CARDIAC CATHETERIZATION  2015   CATARACT EXTRACTION, BILATERAL Bilateral 2014   per Dr. Gershon Crane    COLONOSCOPY  2011   per Dr. Sharlett Iles, benign polyps, no repeats needed    CORONARY ARTERY BYPASS GRAFT N/A 02/28/2013   Procedure: CORONARY ARTERY BYPASS GRAFTING (CABG);  Surgeon: Rexene Alberts, MD;  Location: [place];  Service: Open Heart Surgery;  Laterality: N/A;  CABG x 2,  using left internal mammary artery and right leg saphenous vein harvested endoscopically   ESOPHAGOGASTRODUODENOSCOPY (EGD) WITH PROPOFOL N/A 01/11/2019   Procedure: ESOPHAGOGASTRODUODENOSCOPY (EGD) WITH PROPOFOL;  Surgeon: Doran Stabler, MD;  [facility]: [facility];  Service: Gastroenterology;  Laterality: N/A;   HEMORRHOID SURGERY     INTRAOPERATIVE TRANSESOPHAGEAL ECHOCARDIOGRAM N/A 02/28/2013   Procedure: INTRAOPERATIVE TRANSESOPHAGEAL ECHOCARDIOGRAM;  Surgeon: Rexene Alberts, MD;  Location: [place];  Service: Open Heart Surgery;  Laterality: N/A;   LEFT HEART CATHETERIZATION WITH CORONARY ANGIOGRAM N/A 02/26/2013    Procedure: LEFT HEART CATHETERIZATION WITH CORONARY ANGIOGRAM;  Surgeon: Leonie Man, MD;  Location[facility] CATH LAB;  Service: Cardiovascular;  Laterality: N/A;   MAZE N/A 02/28/2013   Procedure: MAZE;  Surgeon: Rexene Alberts, MD;  Location: [place];  Service: Open Heart Surgery;  Laterality: N/A;   MITRAL VALVE REPAIR N/A 02/28/2013   Procedure: MITRAL VALVE REPAIR (MVR);  Surgeon: Rexene Alberts, MD;  Location: [place];  Service: Open Heart Surgery;  Laterality: N/A;   PROSTATE BIOPSY     TRANSURETHRAL RESECTION OF PROSTATE N/A 02/04/2017   Procedure: TRANSURETHRAL RESECTION OF THE PROSTATE (TURP);  Surgeon: Alexis Frock, MD;  Location: [facility];  Service: Urology;  Laterality: N/A;   Family History  Problem Relation Age of Onset   Hypertension Mother    Sudden death Mother    Stroke Mother    Heart disease Mother    Hypertension Father    Sudden death Father    Stroke Father  Heart disease Father    Heart disease Son    Diabetes Son    Lung cancer Brother    Stomach cancer Brother    Cancer Daughter        rebdomyosarcoma   Kidney cancer Brother        mets   Diabetes Sister    Social History   Socioeconomic History   Marital status: Married    Spouse name: Mikle Bosworth   Number of children: 3   Years of education: Not on file   Highest education level: Not on file  Occupational History   Occupation: Retired  Tobacco Use   Smoking status: Never   Smokeless tobacco: Never  Vaping Use   Vaping Use: Never used  Substance and Sexual Activity   Alcohol use: No    Alcohol/week: 0.0 standard drinks   Drug use: No   Sexual activity: Not Currently  Other Topics Concern   Not on file  Social History Narrative   Married retired Biomedical scientist   Lives at [employer] [facility], independent living though his wife is in a wheelchair and they help each other   1 son lives in [place] a daughter lives in [place]   Never smoker no alcohol or drugs   Social  Determinants of Radio broadcast assistant Strain: Low Risk    Difficulty of Paying Living Expenses: Not hard at all  Food Insecurity: No Food Insecurity   Worried About Charity fundraiser in the Last Year: Never true   Arboriculturist in the Last Year: Never true  Transportation Needs: No Transportation Needs   Lack of Transportation (Medical): No   Lack of Transportation (Non-Medical): No  Physical Activity: Inactive   Days of Exercise per Week: 0 days   Minutes of Exercise per Session: 0 min  Stress: No Stress Concern Present   Feeling of Stress : Not at all  Social Connections: Moderately Integrated   Frequency of Communication with Friends and Family: More than three times a week   Frequency of Social Gatherings with Friends and Family: More than three times a week   Attends Religious Services: More than 4 times per year   Active Member of [employer] or Organizations: No   Attends Music therapist: Never   Marital Status: Married    Tobacco Counseling Counseling given: Not Answered   Clinical Intake:  Pre-visit preparation completed: Yes  Pain : No/denies pain     BMI - recorded: 23.66 Nutritional Status: BMI of 19-24  Normal Nutritional Risks: None Diabetes: No  How often do you need to have someone help you when you read instructions, pamphlets, or other written materials from your doctor or pharmacy?: 1 - Never  Diabetic?No  Interpreter Needed?: No  Information entered by :: Charlott Rakes, LPN   Activities of Daily Living In your present state of health, do you have any difficulty performing the following activities: 09/10/2020  Hearing? Y  Vision? N  Difficulty concentrating or making decisions? N  Walking or climbing stairs? N  Comment don't use stairs  Dressing or bathing? N  Doing errands, shopping? N  Preparing Food and eating ? N  Using the Toilet? N  In the past six months, have you accidently leaked urine? N  Do you have problems  with loss of bowel control? Y  Comment wears depends  Managing your Medications? N  Managing your Finances? N  Housekeeping or managing your Housekeeping? N  Some recent data might be hidden    Patient Care Team: [name], MD as PCP - General (Family Medicine)  Indicate any recent Medical Services you may have received from other than [facility] providers in the past year (date may be approximate).     Assessment:   This is a routine wellness examination for [employer].  Hearing/Vision screen Hearing Screening - Comments:: Pt is HOH Vision Screening - Comments:: PT FOLLOWS UP WITH DR Gershon Crane FOR ANNUAL EYE EXAMS   Dietary issues and exercise activities discussed: Current Exercise Habits: The patient does not participate in regular exercise at present   Goals Addressed             This Visit's Progress    none at this time         Depression Screen PHQ 2/9 Scores 09/10/2020 01/03/2020 01/22/2019 01/16/2019 01/30/2018 01/27/2016 01/24/2015  PHQ - 2 Score 0 0 0 0 0 0 0    Fall Risk Fall Risk  09/10/2020 01/03/2020 01/22/2019 01/16/2019 01/30/2018  Falls in the past year? 0 1 1 1  0  Number falls in past yr: 0 1 1 1  0  Injury with Fall? 0 0 1 1 0  Risk for fall due to : Impaired balance/gait;Impaired mobility - History of fall(s);Impaired balance/gait History of fall(s);Impaired balance/gait -  Follow up Falls prevention discussed - Falls prevention discussed Falls prevention discussed -    FALL RISK PREVENTION PERTAINING TO THE HOME:  Any stairs in or around the home? No  If so, are there any without handrails? No  Home free of loose throw rugs in walkways, pet beds, electrical cords, etc? Yes  Adequate lighting in your home to reduce risk of falls? Yes   ASSISTIVE DEVICES UTILIZED TO PREVENT FALLS:  Life alert? Yes  Use of a cane, walker or w/c? Yes  Grab bars in the bathroom? Yes  Shower chair or bench in shower? Yes  Elevated toilet seat or a handicapped toilet?  Yes   TIMED UP AND GO:  Was the test performed? No     Cognitive Function:     6CIT Screen 09/10/2020  What Year? 0 points  What month? 0 points  What time? 0 points  Count back from 20 0 points  Months in reverse 0 points  Repeat phrase 0 points  Total Score 0    Immunizations Immunization History  Administered Date(s) Administered   Fluad Quad(high Dose 65+) 11/21/2018, 12/25/2019   Influenza Split 11/01/2011   Influenza Whole 12/15/2006, 12/06/2007, 12/31/2009   Influenza, High Dose Seasonal PF 11/11/2014, 11/12/2015, 11/15/2016, 11/18/2017, 11/21/2018   Influenza,inj,Quad PF,6+ Mos 11/01/2012, 11/09/2013   Influenza-Unspecified 11/01/2011, 11/01/2012, 11/09/2013   Moderna Sars-Covid-2 Vaccination 02/26/2019, 03/26/2019   Pneumococcal Conjugate-13 11/09/2013   Pneumococcal Polysaccharide-23 02/23/2004   Td 02/22/2002   Tdap 01/22/2014    TDAP status: Up to date  Flu Vaccine status: Up to date  Pneumococcal vaccine status: Up to date  Covid-19 vaccine status: Completed vaccines  Qualifies for Shingles Vaccine? Yes   Zostavax completed No   Shingrix Completed?: No.    Education has been provided regarding the importance of this vaccine. Patient has been advised to call insurance company to determine out of pocket expense if they have not yet received this vaccine. Advised may also receive vaccine at local pharmacy or Health Dept. Verbalized acceptance and understanding.  Screening Tests Health Maintenance  Topic Date Due   URINE MICROALBUMIN  Never done   Zoster Vaccines- Shingrix (1  of 2) Never done   COVID-19 Vaccine (4 - Booster for Moderna series) 04/08/2020   INFLUENZA VACCINE  09/22/2020   TETANUS/TDAP  01/23/2024   PNA vac Low Risk Adult  Completed   HPV VACCINES  Aged Out   COLONOSCOPY (Pts 45-61yrs Insurance coverage will need to be confirmed)  Discontinued    Health Maintenance  Health Maintenance Due  Topic Date Due   URINE MICROALBUMIN   Never done   Zoster Vaccines- Shingrix (1 of 2) Never done   COVID-19 Vaccine (4 - Booster for Moderna series) 04/08/2020    Colorectal cancer screening: No longer required.   Additional Screening:   Vision Screening: Recommended annual ophthalmology exams for early detection of glaucoma and other disorders of the eye. Is the patient up to date with their annual eye exam?  Yes  Who is the provider or what is the name of the office in which the patient attends annual eye exams? Dr Gershon Crane  If pt is not established with a provider, would they like to be referred to a provider to establish care? No .   Dental Screening: Recommended annual dental exams for proper oral hygiene  Community Resource Referral / Chronic Care Management: CRR required this visit?  No   CCM required this visit?  No      Plan:     I have personally reviewed and noted the following in the patient's chart:   Medical and social history Use of alcohol, tobacco or illicit drugs  Current medications and supplements including opioid prescriptions. Patient is not currently taking opioid prescriptions. Functional ability and status Nutritional status Physical activity Advanced directives List of other physicians Hospitalizations, surgeries, and ER visits in previous 12 months Vitals Screenings to include cognitive, depression, and falls Referrals and appointments  In addition, I have reviewed and discussed with patient certain preventive protocols, quality metrics, and best practice recommendations. A written personalized care plan for preventive services as well as general preventive health recommendations were provided to patient.     Willette Brace, LPN   03/18/5807   Nurse Notes: None

## 2020-09-18 DIAGNOSIS — C61 Malignant neoplasm of prostate: Secondary | ICD-10-CM | POA: Diagnosis not present

## 2020-09-25 ENCOUNTER — Telehealth: Payer: Self-pay | Admitting: Family Medicine

## 2020-09-25 NOTE — Chronic Care Management (AMB) (Signed)
  Chronic Care Management   Note  09/25/2020 Name: [name]. MRN: [id number] DOB: Nov 22, 1927  [name]. is a 87 y.o. year old male who is a primary care patient of [name], MD. I reached out to [employer]. by phone today in response to a referral sent by [name]'s PCP, [name], MD.   [name] was given information about Chronic Care Management services today including:  CCM service includes personalized support from designated clinical staff supervised by his physician, including individualized plan of care and coordination with other care providers 24/7 contact phone numbers for assistance for urgent and routine care needs. Service will only be billed when office clinical staff spend 20 minutes or more in a month to coordinate care. Only one practitioner may furnish and bill the service in a calendar month. The patient may stop CCM services at any time (effective at the end of the month) by phone call to the office staff.   Patient agreed to services and verbal consent obtained.   Follow up plan:   Tatjana Secretary/administrator

## 2020-09-26 DIAGNOSIS — M79671 Pain in right foot: Secondary | ICD-10-CM | POA: Diagnosis not present

## 2020-09-26 DIAGNOSIS — M79672 Pain in left foot: Secondary | ICD-10-CM | POA: Diagnosis not present

## 2020-09-26 DIAGNOSIS — B351 Tinea unguium: Secondary | ICD-10-CM | POA: Diagnosis not present

## 2020-10-26 ENCOUNTER — Other Ambulatory Visit: Payer: Self-pay | Admitting: Family Medicine

## 2020-10-29 ENCOUNTER — Other Ambulatory Visit: Payer: Self-pay | Admitting: Family Medicine

## 2020-10-31 ENCOUNTER — Telehealth: Payer: Self-pay | Admitting: Pharmacist

## 2020-10-31 NOTE — Chronic Care Management (AMB) (Signed)
Chronic Care Management Pharmacy Assistant   Name: [name].  MRN: [id number] DOB: 07-31-1927  Reason for Encounter: Chart review for Initial Visit with [place] Pharmacist on 11-04-20 at 3:30 via phone call   Conditions to be addressed/monitored: Atrial Fibrillation, CHF, HTN, HLD, and GERD  Recent office visits:  09-10-2020 Willette Brace, LPN - Patient presented for Medicare Wellness Exam. No medication changes.  07-14-2020 [name], MD - Patient presented for Generalized abdominal pain. No medication changes.   Recent consult visits:  08-06-2020 Hyman Hopes, MD  (Ophthalmology) - Patient presented for a post op exam. No medication changes.  08-04-2020 Hyman Hopes, MD  (Ophthalmology) - Patient presented for initial consult. No other details available.  07-23-2020 Wyatt Portela, MD (Oncology) - Patient presented for Malignant neoplasm of prostate. No medication changes.  06-16-2020 Doran Stabler, MD (Gastroenterology) - Patient presented for Iron deficiency anemia due to chronic blood loss and other concerns. Stopped Pantoprazole 40 mg.  05-20-2020 Puschinsky, Fransico Him. (Urology) - Patient presented for procedures associated with Malignant neoplasm of prostate. No visit details available.    Hospital visits:   09-03-2020 Puschinsky, Farris Has (Urology) - Patient presented to [place] for CYTOSCOPY,BILATERAL RETROGRADES , LEFT URETEROSCOPY AND DILATION, LEFT STENT CHANGE, FLUOROSCOPY. No other details available.    Medication Reconciliation was completed by comparing discharge summary, patient's EMR and Pharmacy list, and upon discussion with patient.  Patient [facility] ED on 07-07-2020  due to Generalized abdominal pain and other concerns. Patient was present for 8 hours   New?Medications Started at [facility] Discharge:?? -started loperamide 2 MG  capsule  Medication Changes at Hospital Discharge: -Changed  None  Medications Discontinued at Hospital Discharge: -Stopped  None  Medications that remain the same after Hospital Discharge:??  -All other medications will remain the same.     Medications: Outpatient Encounter Medications as of 10/31/2020  Medication Sig Note   amoxicillin (AMOXIL) 500 MG capsule Take 2,000 mg by mouth as directed. Prior to dental appointment    atorvastatin (LIPITOR) 10 MG tablet TAKE ONE TABLET BY MOUTH DAILY AT 6PM    Cholecalciferol (VITAMIN D3 PO) Take 2,000 Units by mouth daily.    Cyanocobalamin 2500 MCG SUBL Place 1 tablet under the tongue daily.    diphenoxylate-atropine (LOMOTIL) 2.5-0.025 MG tablet Take 1 tablet by mouth 4 (four) times daily. (Patient not taking: No sig reported)    ferrous sulfate 325 (65 FE) MG tablet Take 1 tablet (325 mg total) by mouth daily.    fish oil-omega-3 fatty acids 1000 MG capsule Take 1 g by mouth every morning.    Leuprolide Acetate, 6 Month, (LUPRON) 45 MG injection Inject 45 mg into the muscle every 3 (three) months.     levothyroxine (SYNTHROID) 50 MCG tablet TAKE ONE TABLET BY MOUTH EVERY MORNING    loperamide (IMODIUM) 2 MG capsule Take 1 capsule (2 mg total) by mouth 4 (four) times daily as needed for diarrhea or loose stools. (Patient not taking: No sig reported)    metoprolol tartrate (LOPRESSOR) 25 MG tablet TAKE ONE TABLET BY MOUTH TWICE A DAY    nitroGLYCERIN (NITROSTAT) 0.4 MG SL tablet Place 1 tablet (0.4 mg total) under the tongue every 5 (five) minutes as needed for chest pain. (Patient not taking: No sig reported) 06/16/2020: On hand   vitamin C (ASCORBIC ACID) 500 MG tablet Take 500 mg by  mouth every morning.    No facility-administered encounter medications on file as of 10/31/2020.  Fill History : ATORVASTATIN CALCIUM '10MG'$  TAB 10/29/2020 90   LEVOTHYROXINE SODIUM 50MCG TAB 10/29/2020 90   METOPROLOL TARTRATE '25MG'$  TAB 10/29/2020 90    PANTOPRAZOLE SODIUM '40MG'$  DR TAB 10/29/2020 90   LOPERAMIDE HYDROCHLORIDE '2MG'$  CAP 07/09/2020 3   CIPROFLOXACIN HYDROCHLORI '500MG'$  TAB 09/03/2020 5   DIPHEN/ATROP TAB 2.'5MG'$  01/22/2019 40   FUROSEMIDE   TAB '20MG'$  01/10/2018 90   Have you seen any other providers since your last visit? Patient reports other than his eye doctor and Urologist that he sees every 3 months for a procedure no.  Any changes in your medications or health? Patient reports none since last visit with Dr Sarajane Jews.  Any side effects from any medications? Patient reports none.  Do you have an symptoms or problems not managed by your medications? Patient reports not that he can tell.  Any concerns about your health right now? Patient reports no.  Has your provider asked that you check blood pressure, blood sugar, or follow special diet at home? Patient reports no.  Do you get any type of exercise on a regular basis? Patient reports they live in a retirement community and his wife is bedridden, he is her caretaker so does not have much time without leaving his wife to participate in any activities offered, he is however active with her care.  Can you think of a goal you would like to reach for your health? Patient reports he would like to reach 100 just as his great aunt did.  Do you have any problems getting your medications? Patient reports no issues with cost of medications and is happy with [facility].  Is there anything that you would like to discuss during the appointment? Patient reports no, advised if he should think of anything to write it down for appointment.  Advised he should have available medications and supplements for call and that I will call to remind the day prior. Patient in agreement.  Care Gaps: Urine Micro - Overdue Zoster Vaccine - Overdue COVID Booster #4 (Moderna) - Overdue Flu Vaccine - Overdue AWV - Scheduled for 09-22-2021  Star Rating Drugs: Atorvastatin (Lipitor) 10 mg - Last filled  10-29-2020 90 DS at [place] Pharmacist Assistant [phone]

## 2020-11-04 ENCOUNTER — Ambulatory Visit (INDEPENDENT_AMBULATORY_CARE_PROVIDER_SITE_OTHER): Payer: Medicare HMO | Admitting: Pharmacist

## 2020-11-04 DIAGNOSIS — I10 Essential (primary) hypertension: Secondary | ICD-10-CM

## 2020-11-04 DIAGNOSIS — I480 Paroxysmal atrial fibrillation: Secondary | ICD-10-CM

## 2020-11-04 NOTE — Progress Notes (Signed)
Chronic Care Management Pharmacy Note  11/21/2020 Name:  [name]. MRN:  [id number] DOB:  1927-03-08  Summary: Pt does not check BP at home but does have balance issues LDL at goal < 70  Recommendations/Changes made from today's visit: -Recommended stopping fish oil due to controlled triglycerides and increased risk for bleeding -Recommended monitoring BP at home  Plan: BP assessment in 3 months  Subjective: Luis Cass. is an 87 y.o. year old male who is a primary patient of [name], MD.  The CCM team was consulted for assistance with disease management and care coordination needs.    Engaged with patient by telephone for initial visit in response to provider referral for pharmacy case management and/or care coordination services.   Consent to Services:  The patient was given the following information about Chronic Care Management services today, agreed to services, and gave verbal consent: 1. CCM service includes personalized support from designated clinical staff supervised by the primary care provider, including individualized plan of care and coordination with other care providers 2. 24/7 contact phone numbers for assistance for urgent and routine care needs. 3. Service will only be billed when office clinical staff spend 20 minutes or more in a month to coordinate care. 4. Only one practitioner may furnish and bill the service in a calendar month. 5.The patient may stop CCM services at any time (effective at the end of the month) by phone call to the office staff. 6. The patient will be responsible for cost sharing (co-pay) of up to 20% of the service fee (after annual deductible is met). Patient agreed to services and consent obtained.  Patient Care Team: [name], MD as PCP - General (Family Medicine) [name], [facility] as Pharmacist (Pharmacist)  Recent office visits: 09-10-2020 Willette Brace, LPN - Patient presented for Medicare Wellness  Exam. No medication changes.   07-14-2020 [name], MD - Patient presented for Generalized abdominal pain. No medication changes.  Recent consult visits: 08-06-2020 Hyman Hopes, MD  (Ophthalmology) - Patient presented for a post op exam. No medication changes.   08-04-2020 Hyman Hopes, MD  (Ophthalmology) - Patient presented for initial consult. No other details available.   07-23-2020 Wyatt Portela, MD (Oncology) - Patient presented for Malignant neoplasm of prostate. No medication changes.   06-16-2020 Doran Stabler, MD (Gastroenterology) - Patient presented for Iron deficiency anemia due to chronic blood loss and other concerns. Stopped Pantoprazole 40 mg.   05-20-2020 Puschinsky, Fransico Him. (Urology) - Patient presented for procedures associated with Malignant neoplasm of prostate. No visit details available.    Hospital visits: 09-03-2020 Puschinsky, Farris Has (Urology) - Patient presented to [facility] for CYTOSCOPY,BILATERAL RETROGRADES , LEFT URETEROSCOPY AND DILATION, LEFT STENT CHANGE, FLUOROSCOPY. No other details available.       Medication Reconciliation was completed by comparing discharge summary, patient's EMR and Pharmacy list, and upon discussion with patient.   Patient presented to [facility] ED on 07-07-2020  due to Generalized abdominal pain and other concerns. Patient was present for 8 hours    New?Medications Started at [facility] Discharge:?? -started loperamide 2 MG capsule   Medication Changes at Hospital Discharge: -Changed  None   Medications Discontinued at Hospital Discharge: -Stopped  None   Medications that remain the same after Hospital Discharge:??  -All other medications will remain the same.      Objective:  Lab Results  Component Value Date   CREATININE 1.11 07/23/2020   BUN 19 07/23/2020   GFR 53.16 (L) 06/16/2020   GFRNONAA >60 07/23/2020   GFRAA 70 01/03/2020    NA 141 07/23/2020   K 4.3 07/23/2020   CALCIUM 9.0 07/23/2020   CO2 22 07/23/2020   GLUCOSE 88 07/23/2020    Lab Results  Component Value Date/Time   HGBA1C 5.6 07/01/2016 01:40 PM   HGBA1C 5.6 01/27/2016 11:27 AM   GFR 53.16 (L) 06/16/2020 11:06 AM   GFR 61.01 05/23/2019 09:56 AM    Last diabetic Eye exam: No results found for: HMDIABEYEEXA  Last diabetic Foot exam: No results found for: HMDIABFOOTEX   Lab Results  Component Value Date   CHOL 130 01/03/2020   HDL 51 01/03/2020   LDLCALC 63 01/03/2020   LDLDIRECT 159.3 05/17/2007   TRIG 76 01/03/2020   CHOLHDL 2.5 01/03/2020    Hepatic Function Latest Ref Rng & Units 07/23/2020 07/07/2020 06/16/2020  Total Protein 6.5 - 8.1 g/dL 6.2(L) 6.6 6.6  Albumin 3.5 - 5.0 g/dL 3.1(L) 3.5 3.5  AST 15 - 41 U/L _0 ALT 0 - 44 U/L _1 Alk Phosphatase 38 - 126 U/L 57 55 57  Total Bilirubin 0.3 - 1.2 mg/dL 0.6 0.8 0.4  Bilirubin, Direct 0.0 - 0.2 mg/dL - - -    Lab Results  Component Value Date/Time   TSH 4.37 06/16/2020 11:06 AM   TSH 2.62 01/03/2020 11:49 AM   FREET4 0.93 06/16/2020 11:06 AM   FREET4 1.2 01/03/2020 11:49 AM    CBC Latest Ref Rng & Units 07/23/2020 07/07/2020 06/16/2020  WBC 4.0 - 10.5 K/uL 4.7 6.2 4.7  Hemoglobin 13.0 - 17.0 g/dL 11.3(L) 11.9(L) 12.1(L)  Hematocrit 39.0 - 52.0 % 34.0(L) 37.0(L) 35.0(L)  Platelets 150 - 400 K/uL 149(L) 157 161.0    Lab Results  Component Value Date/Time   VD25OH 39 01/03/2020 11:49 AM   VD25OH 39 08/02/2007 10:41 AM    Clinical ASCVD: Yes  The ASCVD Risk score (Arnett DK, et al., 2019) failed to calculate for the following reasons:   The 2019 ASCVD risk score is only valid for ages 11 to 22    Depression screen PHQ 2/9 09/10/2020 01/03/2020 01/22/2019  Decreased Interest 0 0 0  Down, Depressed, Hopeless 0 0 0  PHQ - 2 Score 0 0 0  Some recent data might be hidden    CHA2DS2/VAS Stroke Risk Points  Current as of 4 days ago     6 >= 2 Points: High Risk  1 -  1.99 Points: Medium Risk  0 Points: Low Risk    Last Change: N/A      Details    This score determines the patient's risk of having a stroke if the  patient has atrial fibrillation.       Points Metrics  1 Has Congestive Heart Failure:  Yes    Current as of 4 days ago  1 Has Vascular Disease:  Yes    Current as of 4 days ago  1 Has Hypertension:  Yes    Current as of 4 days ago  2 Age:  61    Current as of 4 days ago  1 Has Diabetes:  Yes     Current as of 4 days ago  0 Had Stroke:  No  Had TIA:  No  Had Thromboembolism:  No    Current as of 4 days ago  0 Male:  No    Current as of 4 days ago       Social History   Tobacco Use  Smoking Status Never  Smokeless Tobacco Never   BP Readings from Last 3 Encounters:  07/23/20 110/60  07/14/20 120/68  07/07/20 (!) 170/76   Pulse Readings from Last 3 Encounters:  07/23/20 (!) 59  07/14/20 74  07/07/20 (!) 44   Wt Readings from Last 3 Encounters:  07/23/20 160 lb 3.2 oz (72.7 kg)  07/14/20 159 lb (72.1 kg)  07/07/20 160 lb (72.6 kg)   BMI Readings from Last 3 Encounters:  07/23/20 23.66 kg/m  07/14/20 23.48 kg/m  07/07/20 23.63 kg/m    Assessment/Interventions: Review of patient past medical history, allergies, medications, health status, including review of consultants reports, laboratory and other test data, was performed as part of comprehensive evaluation and provision of chronic care management services.   SDOH:  (Social Determinants of Health) assessments and interventions performed: Yes SDOH Interventions    Flowsheet Row Most Recent Value  SDOH Interventions   Financial Strain Interventions Intervention Not Indicated  Transportation Interventions Intervention Not Indicated      SDOH Screenings   Alcohol Screen: Not on file  Depression (PHQ2-9): Low Risk    PHQ-2 Score: 0  Financial Resource Strain: Low Risk    Difficulty of Paying Living Expenses: Not hard at all  Food Insecurity: No Food  Insecurity   Worried About Charity fundraiser in the Last Year: Never true   Ran Out of Food in the Last Year: Never true  Housing: Low Risk    Last Housing Risk Score: 0  Physical Activity: Inactive   Days of Exercise per Week: 0 days   Minutes of Exercise per Session: 0 min  Social Connections: Moderately Integrated   Frequency of Communication with Friends and Family: More than three times a week   Frequency of Social Gatherings with Friends and Family: More than three times a week   Attends Religious Services: More than 4 times per year   Active Member of [employer] or Organizations: No   Attends Archivist Meetings: Never   Marital Status: Married  Stress: No Stress Concern Present   Feeling of Stress : Not at all  Tobacco Use: Low Risk    Smoking Tobacco Use: Never   Smokeless Tobacco Use: Never  Transportation Needs: No Transportation Needs   Lack of Transportation (Medical): No   Lack of Transportation (Non-Medical): No   Patient lives in a retirement community and they provide his meals and they have labeled it as "nourishing". They were providing fresh fruit and he was eating that a lot but was causing diarrhea.  Patient reads the information that comes with medications and is not always happy with what he reads.   Patient has terminal prostate cancer and is taking the Lupron injections for this. He has also had radiation therapy and had 40 sessions of this. Patient is satisfied with the care he is getting over there and goes to see his oncologist about 2 times a year.  Patient is following with Dr. Loletha Carrow with GI problems. He was having lots of black stool. He recommended lactose free milk and this helped with a bit at first. He is still struggling to identify what foods cause his diarrhea. Patient does enjoy cookies and eats them some. Patient has been dealing with diarrhea on and off since 2019. Recommended Align probiotics.  CCM Care Plan  Allergies  Allergen  Reactions   Other Other (See Comments)    ANESTHESIA.... 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 ANESTHESIA.... 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 Occurred post op CABG 2015, required readmission and rehab    Medications Reviewed Today     Reviewed by [name], [facility] (Pharmacist) on 11/04/20 at 1650  Med List Status: <None>   Medication Order Taking? Sig Documenting Provider Last Dose Status Informant  amoxicillin (AMOXIL) 500 MG capsule [id number] Yes Take 2,000 mg by mouth as directed. Prior to dental appointment [provider] Taking Active            Med Note Rosemarie Beath, MELISSA B   Tue Jan 25, 2017  2:32 PM)    atorvastatin (LIPITOR) 10 MG tablet [id number] Yes TAKE ONE TABLET BY MOUTH DAILY AT Darnelle Maffucci, MD Taking Active   Cholecalciferol (VITAMIN D3 PO) [id number] Yes Take 2,000 Units by mouth daily. [provider] Taking Active Self  Cyanocobalamin 2500 MCG SUBL [id number] Yes Place 1 tablet under the tongue daily. [provider] Taking Active   ferrous sulfate 325 (65 FE) MG tablet [id number] Yes Take 1 tablet (325 mg total) by mouth daily. [name], MD Taking Active   fish oil-omega-3 fatty acids 1000 MG capsule [id number] Yes Take 1 g by mouth every morning. [provider] Taking Active Self           Med Note Jimmey Ralph, [facility]   Sun Apr 23, 2018  3:24 PM)    Leuprolide Acetate, 6 Month, (LUPRON) 45 MG injection [id number] Yes Inject 45 mg into the muscle every 3 (three) months.  [provider] Taking Active Self  levothyroxine (SYNTHROID) 50 MCG tablet [id number] Yes TAKE ONE TABLET BY MOUTH EVERY MORNING [name], MD Taking Active   metoprolol tartrate (LOPRESSOR) 25 MG tablet [id number] Yes TAKE ONE TABLET BY MOUTH TWICE A DAY [name], MD Taking Active   nitroGLYCERIN (NITROSTAT) 0.4 MG SL tablet [id number] Yes Place 1 tablet (0.4 mg total) under the tongue every 5  (five) minutes as needed for chest pain. Fay Records, MD Taking Active            Med Note (DAVIS, SOPHIA A   Mon Jun 16, 2020  9:59 AM) On hand  vitamin C (ASCORBIC ACID) 500 MG tablet [id number] Yes Take 500 mg by mouth every morning. [provider] Taking Active Self            Patient Active Problem List   Diagnosis Date Noted   GI bleed 01/10/2019   Melena    Acute blood loss anemia    Symptomatic anemia 01/09/2019   Fall at home, initial encounter 01/09/2019   Anemia due to multiple mechanisms 01/08/2019   Stricture or kinking of ureter 12/06/2018   Ureteral stricture, left 04/19/2018   PVC's (premature ventricular contractions) 11/07/2017   Urinary retention 02/04/2017   PAF (paroxysmal atrial fibrillation), CHA2DS2VASc score 4 05/03/2014   Dizziness 05/03/2014   Encounter for therapeutic drug monitoring 05/07/2013   S/P CABG x 2 03/26/2013   S/P MVR (mitral valve repair) 03/26/2013   S/P Maze operation for atrial fibrillation 03/26/2013   Current use of long term anticoagulation 03/16/2013   Chronic diastolic CHF (congestive heart failure) ([place]) 03/12/2013   Pre-syncope 03/12/2013   Pleural effusion, bilateral 03/12/2013   Delirium 03/12/2013   CAD (coronary artery disease) 03/06/2013   S/P mitral valve repair, maze procedure,  and CABG x2 02/29/12 02/28/2013   Unstable angina ([place]) 02/26/2013   Special screening for malignant neoplasms, colon 10/05/2010   Benign neoplasm of colon 10/05/2010   Diverticulosis of colon (without mention of hemorrhage) 10/05/2010   ADENOCARCINOMA, PROSTATE, GLEASON GRADE 3 08/13/2009   SEBACEOUS CYST 08/13/2009   Hypothyroidism 08/02/2007   GERD 08/02/2007   OSTEOPENIA 08/02/2007   UNS ADVRS EFF OTH RX MEDICINAL&BIOLOGICAL SBSTNC 08/02/2007   HLD (hyperlipidemia) 12/15/2006   PROSTATE SPECIFIC ANTIGEN, ELEVATED 12/15/2006   Essential hypertension 09/05/2006   ARTHRITIS 09/05/2006   COLONIC POLYPS, HX OF 09/05/2006     Immunization History  Administered Date(s) Administered   Fluad Quad(high Dose 65+) 11/21/2018, 12/25/2019   Influenza Split 11/01/2011   Influenza Whole 12/15/2006, 12/06/2007, 12/31/2009   Influenza, High Dose Seasonal PF 11/11/2014, 11/12/2015, 11/15/2016, 11/18/2017, 11/21/2018   Influenza,inj,Quad PF,6+ Mos 11/01/2012, 11/09/2013   Influenza-Unspecified 11/01/2011, 11/01/2012, 11/09/2013   Moderna SARS-COV2 Booster Vaccination 01/07/2020   Moderna Sars-Covid-2 Vaccination 02/26/2019, 03/26/2019   Pneumococcal Conjugate-13 11/09/2013   Pneumococcal Polysaccharide-23 02/23/2004   Td 02/22/2002   Tdap 01/22/2014    Conditions to be addressed/monitored:  Hypertension, Hyperlipidemia, Atrial Fibrillation, Coronary Artery Disease, GERD, Osteopenia, and Anemia  Care Plan : CCM Pharmacy Care Plan  Updates made by [name], [place] since 11/21/2020 12:00 AM     Problem: Problem: Hypertension, Hyperlipidemia, Atrial Fibrillation, Coronary Artery Disease, GERD, Osteopenia, and Anemia      Long-Range Goal: Patient-Specific Goal   Start Date: 11/04/2020  Expected End Date: 11/04/2021  This Visit's Progress: On track  Priority: High  Note:   Current Barriers:  Unable to independently monitor therapeutic efficacy  Pharmacist Clinical Goal(s):  Patient will achieve adherence to monitoring guidelines and medication adherence to achieve therapeutic efficacy through collaboration with PharmD and provider.   Interventions: 1:1 collaboration with [name], MD regarding development and update of comprehensive plan of care as evidenced by provider attestation and co-signature Inter-disciplinary care team collaboration (see longitudinal plan of care) Comprehensive medication review performed; medication list updated in electronic medical record  Hypertension (BP goal <140/90) -Not ideally controlled -Current treatment: Metoprolol tartrate 25 mg 1 tablet twice  daily -Medications previously tried: n/a  -Current home readings: does not check -Current dietary habits: retirement community doesn't use salt; he adds some of his own -Current exercise habits: minimal with balance problem -Reports hypotensive/hypertensive symptoms -Educated on BP goals and benefits of medications for prevention of heart attack, stroke and kidney damage; Importance of home blood pressure monitoring; Proper BP monitoring technique; Symptoms of hypotension and importance of maintaining adequate hydration; -Counseled to monitor BP at home weekly, document, and provide log at future appointments -Counseled on diet and exercise extensively Recommended to continue current medication  Hyperlipidemia: (LDL goal < 70) -Controlled -Current treatment: Fish oil 1000 mg daily Atorvastatin 10 mg 1 tablet daily -Medications previously tried: none  -Current dietary patterns: does limit salt intake -Current exercise habits: minimal -Educated on Cholesterol goals;  Importance of limiting foods high in cholesterol; -Recommended considering stopping fish oil as he likely does not need based on triglycerides.  CAD (Goal: prevent heart events) -Controlled -Current treatment  Nitroglycerin 0.4 mg SL tablet as needed Atorvastatin 10 mg 1 tablet daily -Medications previously tried: none  -Recommended checking expiration date of nitroglycerin.  Atrial Fibrillation (Goal: prevent stroke and major bleeding) -Controlled -CHADSVASC: 4 -Current treatment: Rate control: metoprolol tartrate 25 mg 1 tablet twice daily Anticoagulation: none due to anemia -Medications previously tried: none -Home  BP and HR readings: does not check  -Counseled on  importance of checking BP and HR -Recommended to continue current medication  Osteopenia (Goal prevent fractures) -Uncontrolled -Last DEXA Scan: could not locate   T-Score femoral neck: n/a  T-Score total hip: n/a  T-Score lumbar spine:  n/a  T-Score forearm radius: n/a  10-year probability of major osteoporotic fracture: n/a  10-year probability of hip fracture: n/a -Patient is not a candidate for pharmacologic treatment -Current treatment  Vitamin D 2000 units daily -Medications previously tried: none  -Recommend [phone] units of vitamin D daily. Recommend 1200 mg of calcium daily from dietary and supplemental sources. Recommend weight-bearing and muscle strengthening exercises for building and maintaining bone density. -Counseled on diet and exercise extensively Recommended to continue current medication  Hypothyroidism (Goal: TSH 2.5-4.5 with osteopenia) -Controlled -Current treatment  Levothyroxine 50 mcg 1 tablet daily -Medications previously tried: none  -Counseled on importance of taking separate from iron supplementation and on an empty stomach.  Anemia (Goal: prevent bleeding) -Controlled -Current treatment  Ferrous sulfate 325 mg daily -Medications previously tried: none  -Recommended taking with food it it causes an upset stomach.  Health Maintenance -Vaccine gaps: shingrix, COVID booster, influenza  -Current therapy:  Vitamin C 500 mg 1 tablet daily  Leuprolide 45 mg inject every 3 months Vitamin B12 2500 mcg SL daily  Amoxicillin 500 mg 4 capsules prior to dental procedures -Educated on Cost vs benefit of each product must be carefully weighed by individual consumer -Patient is satisfied with current therapy and denies issues -Recommended to continue current medication  Patient Goals/Self-Care Activities Patient will:  - take medications as prescribed check blood pressure weekly, document, and provide at future appointments  Follow Up Plan: Telephone follow up appointment with care management team member scheduled for:6 months       Medication Assistance: None required.  Patient affirms current coverage meets needs.  Compliance/Adherence/Medication fill history: Care Gaps: Shingrix,  COVID booster, influenza, urine microalbumin  Star-Rating Drugs: Atorvastatin (Lipitor) 10 mg - Last filled 10-29-2020 90 DS at [facility]  Patient's preferred pharmacy is:  [facility] - [place], [place] - [address] AT [address] [place] [address] Phone: [phone] Fax: [phone]  [id number] - [place], [place] - [address] AT [address] [place] [address] Phone: [phone] Fax: [phone]  [facility] [id number], [place] - [place] [address] Phone: [phone] Fax: [phone]  Uses pill box? Yes - 7 days a week 2 weeks at a time Pt endorses 99% compliance   We discussed: Benefits of medication synchronization, packaging and delivery as well as enhanced pharmacist oversight with [employer]. Patient decided to: Continue current medication management strategy  Care Plan and Follow Up Patient Decision:  Patient agrees to Care Plan and Follow-up.  Plan: Telephone follow up appointment with care management team member scheduled for:  6 months  Jeni Salles, PharmD, [place] Pharmacist [place] at [place] [phone]

## 2020-11-04 NOTE — Patient Instructions (Signed)
Hi Fritz Pickerel,  It was great to get to meet you over the telephone! Below is a summary of some of the topics we discussed.   Please reach out to me if you have any questions or need anything before our follow up!  Best, Maddie  Jeni Salles, PharmD, [place] [facility] [place]   Visit Information   Goals Addressed   None    There are no care plans to display for this patient.   Mr. Slowe was given information about Chronic Care Management services today including:  CCM service includes personalized support from designated clinical staff supervised by his physician, including individualized plan of care and coordination with other care providers 24/7 contact phone numbers for assistance for urgent and routine care needs. Standard insurance, coinsurance, copays and deductibles apply for chronic care management only during months in which we provide at least 20 minutes of these services. Most insurances cover these services at 100%, however patients may be responsible for any copay, coinsurance and/or deductible if applicable. This service may help you avoid the need for more expensive face-to-face services. Only one practitioner may furnish and bill the service in a calendar month. The patient may stop CCM services at any time (effective at the end of the month) by phone call to the office staff.  Patient agreed to services and verbal consent obtained.   Patient verbalizes understanding of instructions provided today and agrees to view in [place].  Telephone follow up appointment with pharmacy team member scheduled for: 6 months  [name], [facility]

## 2020-11-21 DIAGNOSIS — I10 Essential (primary) hypertension: Secondary | ICD-10-CM

## 2020-11-21 DIAGNOSIS — I480 Paroxysmal atrial fibrillation: Secondary | ICD-10-CM

## 2020-12-04 DIAGNOSIS — C61 Malignant neoplasm of prostate: Secondary | ICD-10-CM | POA: Diagnosis not present

## 2020-12-05 DIAGNOSIS — L84 Corns and callosities: Secondary | ICD-10-CM | POA: Diagnosis not present

## 2020-12-05 DIAGNOSIS — M79671 Pain in right foot: Secondary | ICD-10-CM | POA: Diagnosis not present

## 2020-12-05 DIAGNOSIS — B351 Tinea unguium: Secondary | ICD-10-CM | POA: Diagnosis not present

## 2020-12-05 DIAGNOSIS — M79672 Pain in left foot: Secondary | ICD-10-CM | POA: Diagnosis not present

## 2020-12-17 DIAGNOSIS — N3289 Other specified disorders of bladder: Secondary | ICD-10-CM | POA: Diagnosis not present

## 2020-12-17 DIAGNOSIS — I25119 Atherosclerotic heart disease of native coronary artery with unspecified angina pectoris: Secondary | ICD-10-CM | POA: Diagnosis not present

## 2020-12-17 DIAGNOSIS — Z9889 Other specified postprocedural states: Secondary | ICD-10-CM | POA: Diagnosis not present

## 2020-12-17 DIAGNOSIS — N135 Crossing vessel and stricture of ureter without hydronephrosis: Secondary | ICD-10-CM | POA: Diagnosis not present

## 2020-12-17 DIAGNOSIS — N1330 Unspecified hydronephrosis: Secondary | ICD-10-CM | POA: Diagnosis not present

## 2020-12-17 DIAGNOSIS — Z951 Presence of aortocoronary bypass graft: Secondary | ICD-10-CM | POA: Diagnosis not present

## 2020-12-17 DIAGNOSIS — K219 Gastro-esophageal reflux disease without esophagitis: Secondary | ICD-10-CM | POA: Diagnosis not present

## 2020-12-17 DIAGNOSIS — C61 Malignant neoplasm of prostate: Secondary | ICD-10-CM | POA: Diagnosis not present

## 2020-12-25 DIAGNOSIS — C61 Malignant neoplasm of prostate: Secondary | ICD-10-CM | POA: Diagnosis not present

## 2021-01-02 ENCOUNTER — Ambulatory Visit (INDEPENDENT_AMBULATORY_CARE_PROVIDER_SITE_OTHER): Payer: Medicare HMO

## 2021-01-02 DIAGNOSIS — Z23 Encounter for immunization: Secondary | ICD-10-CM

## 2021-01-06 ENCOUNTER — Telehealth: Payer: Self-pay

## 2021-01-06 NOTE — Telephone Encounter (Addendum)
Last OV for preventative care on 01/03/20. Last AWV 09/10/20.  Attempted to call (no voicemail) to request pt schedule annual appt with PCP.

## 2021-01-08 ENCOUNTER — Other Ambulatory Visit: Payer: Self-pay | Admitting: Family Medicine

## 2021-01-13 NOTE — Telephone Encounter (Signed)
Pt notified of last cpe. Appt scheduled with PCP for 01/28/21.

## 2021-01-26 ENCOUNTER — Telehealth: Payer: Self-pay | Admitting: Pharmacist

## 2021-01-26 NOTE — Chronic Care Management (AMB) (Signed)
Chronic Care Management Pharmacy Assistant   Name: [name].  MRN: [id number] DOB: 1927-11-12  Reason for Encounter: Disease State   Conditions to be addressed/monitored: HTN  Recent office visits:  None  Recent consult visits:  12/17/20 Puschinsky, Farris Has (Urology) - Patient presented for pre-op testing. No other visit details available.   Hospital visits:  None in previous 6 months  Medications: Outpatient Encounter Medications as of 01/26/2021  Medication Sig Note   amoxicillin (AMOXIL) 500 MG capsule Take 2,000 mg by mouth as directed. Prior to dental appointment    atorvastatin (LIPITOR) 10 MG tablet TAKE ONE TABLET BY MOUTH DAILY AT 6PM    Cholecalciferol (VITAMIN D3 PO) Take 2,000 Units by mouth daily.    Cyanocobalamin 2500 MCG SUBL Place 1 tablet under the tongue daily.    ferrous sulfate 325 (65 FE) MG EC tablet TAKE ONE TABLET BY MOUTH DAILY    fish oil-omega-3 fatty acids 1000 MG capsule Take 1 g by mouth every morning.    Leuprolide Acetate, 6 Month, (LUPRON) 45 MG injection Inject 45 mg into the muscle every 3 (three) months.     levothyroxine (SYNTHROID) 50 MCG tablet TAKE ONE TABLET BY MOUTH EVERY MORNING    metoprolol tartrate (LOPRESSOR) 25 MG tablet TAKE ONE TABLET BY MOUTH TWICE A DAY    nitroGLYCERIN (NITROSTAT) 0.4 MG SL tablet Place 1 tablet (0.4 mg total) under the tongue every 5 (five) minutes as needed for chest pain. 06/16/2020: On hand   vitamin C (ASCORBIC ACID) 500 MG tablet Take 500 mg by mouth every morning.    No facility-administered encounter medications on file as of 01/26/2021.  Reviewed chart prior to disease state call. Spoke with patient regarding BP  Recent Office Vitals: BP Readings from Last 3 Encounters:  07/23/20 110/60  07/14/20 120/68  07/07/20 (!) 170/76   Pulse Readings from Last 3 Encounters:  07/23/20 (!) 59  07/14/20 74  07/07/20 (!) 44    Wt Readings from Last 3 Encounters:  07/23/20 160 lb 3.2 oz  (72.7 kg)  07/14/20 159 lb (72.1 kg)  07/07/20 160 lb (72.6 kg)     Kidney Function Lab Results  Component Value Date/Time   CREATININE 1.11 07/23/2020 09:11 AM   CREATININE 1.14 07/07/2020 01:56 PM   CREATININE 1.20 06/16/2020 11:06 AM   CREATININE 1.07 01/03/2020 11:49 AM   CREATININE 1.09 12/04/2019 12:37 PM   GFR 53.16 (L) 06/16/2020 11:06 AM   GFRNONAA >60 07/23/2020 09:11 AM   GFRNONAA 61 01/03/2020 11:49 AM   GFRAA 70 01/03/2020 11:49 AM    BMP Latest Ref Rng & Units 07/23/2020 07/07/2020 06/16/2020  Glucose 70 - 99 mg/dL 88 104(H) 72  BUN 8 - 23 mg/dL 19 22 23   Creatinine 0.61 - 1.24 mg/dL 1.11 1.14 1.20  BUN/Creat Ratio 6 - 22 (calc) - - -  Sodium 135 - 145 mmol/L 141 137 140  Potassium 3.5 - 5.1 mmol/L 4.3 4.3 4.4  Chloride 98 - 111 mmol/L 108 107 106  CO2 22 - 32 mmol/L 22 23 27   Calcium 8.9 - 10.3 mg/dL 9.0 8.9 9.0    Current antihypertensive regimen:  Metoprolol tartrate 25 mg 1 tablet twice daily How often are you checking your Blood Pressure? weekly Current home BP readings: Patient reports average reading of 130/68 denies any lightheadedness, headaches or dizziness.  What recent interventions/DTPs have been made by any provider to improve Blood Pressure control since last CPP Visit: Patient  reports no changes.  Any recent hospitalizations or ED visits since last visit with CPP? No What diet changes have been made to improve Blood Pressure Control?  Patient does not prepare meals, uses some salt What exercise is being done to improve your Blood Pressure Control?  Patient reports he is getting around ok has issues with his balance at times.   Adherence Review: Is the patient currently on ACE/ARB medication? No Does the patient have >5 day gap between last estimated fill dates? No    Care Gaps: Urine Micro - Overdue Zoster Vaccine - Overdue COVID Booster - Overdue CCM- 3/23 AWV- 8/23 BP- 110/60 ( 07/24/20)  Star Rating Drugs: Atorvastatin (Lipitor) 10  mg - Last filled 10/29/20 90 [facility] at [facility]  Patient Assistance: None  Ned Clines [facility] Clinical Pharmacist Assistant [phone]

## 2021-01-28 ENCOUNTER — Ambulatory Visit (INDEPENDENT_AMBULATORY_CARE_PROVIDER_SITE_OTHER): Payer: Medicare HMO | Admitting: Family Medicine

## 2021-01-28 ENCOUNTER — Encounter: Payer: Self-pay | Admitting: Family Medicine

## 2021-01-28 VITALS — BP 130/70 | HR 49 | Temp 98.2°F | Ht 69.0 in | Wt 156.6 lb

## 2021-01-28 DIAGNOSIS — R519 Headache, unspecified: Secondary | ICD-10-CM | POA: Diagnosis not present

## 2021-01-28 DIAGNOSIS — I2510 Atherosclerotic heart disease of native coronary artery without angina pectoris: Secondary | ICD-10-CM | POA: Diagnosis not present

## 2021-01-28 DIAGNOSIS — M129 Arthropathy, unspecified: Secondary | ICD-10-CM

## 2021-01-28 DIAGNOSIS — Z23 Encounter for immunization: Secondary | ICD-10-CM | POA: Diagnosis not present

## 2021-01-28 DIAGNOSIS — I10 Essential (primary) hypertension: Secondary | ICD-10-CM | POA: Diagnosis not present

## 2021-01-28 DIAGNOSIS — D6489 Other specified anemias: Secondary | ICD-10-CM | POA: Diagnosis not present

## 2021-01-28 DIAGNOSIS — E039 Hypothyroidism, unspecified: Secondary | ICD-10-CM

## 2021-01-28 DIAGNOSIS — C61 Malignant neoplasm of prostate: Secondary | ICD-10-CM | POA: Diagnosis not present

## 2021-01-28 DIAGNOSIS — R29818 Other symptoms and signs involving the nervous system: Secondary | ICD-10-CM

## 2021-01-28 DIAGNOSIS — I5032 Chronic diastolic (congestive) heart failure: Secondary | ICD-10-CM

## 2021-01-28 DIAGNOSIS — K219 Gastro-esophageal reflux disease without esophagitis: Secondary | ICD-10-CM

## 2021-01-28 LAB — TSH: TSH: 2.42 u[IU]/mL (ref 0.35–5.50)

## 2021-01-28 LAB — LIPID PANEL
Cholesterol: 121 mg/dL (ref 0–200)
HDL: 51.1 mg/dL (ref >39.00)
LDL Cholesterol: 55 mg/dL (ref 0–99)
NonHDL: 70.33
Total CHOL/HDL Ratio: 2
Triglycerides: 76 mg/dL (ref 0.0–149.0)
VLDL: 15.2 mg/dL (ref 0.0–40.0)

## 2021-01-28 LAB — BASIC METABOLIC PANEL
BUN: 20 mg/dL (ref 6–23)
CO2: 27 mEq/L (ref 19–32)
Calcium: 9.4 mg/dL (ref 8.4–10.5)
Chloride: 106 mEq/L (ref 96–112)
Creatinine, Ser: 1.06 mg/dL (ref 0.40–1.50)
GFR: 61.42 mL/min (ref >60.00)
Glucose, Bld: 82 mg/dL (ref 70–99)
Potassium: 4.5 mEq/L (ref 3.5–5.1)
Sodium: 141 mEq/L (ref 135–145)

## 2021-01-28 NOTE — Progress Notes (Signed)
Subjective:    Patient ID: [name]., male    DOB: 1927-09-17, 87 y.o.   MRN: [id number]  HPI Here with his daughter to follow up on issues. He is doing well in general but his daughter thinks he may have had a mild stroke about a month ago. There has been no dramatic events, no slurred speech or weakness in the arms or legs. However she has seen subtle changes in his thought processing and speech. He can form words but he seems to have trouble finding the words he wants to say. His memory has been worse than usual as well. Even Fritz Pickerel says it is hard to get his thoughts into words. He has had a mild headache off and on for the past month, and this is unusual for him. No recent medication changes, no head trauma. He sees the Oncology clinic regularly, and every 3 months he has a CMET and CBC drawn.    Review of Systems  Constitutional: Negative.   HENT: Negative.    Eyes: Negative.   Respiratory: Negative.    Cardiovascular: Negative.   Gastrointestinal: Negative.   Genitourinary: Negative.   Musculoskeletal: Negative.   Skin: Negative.   Neurological:  Positive for speech difficulty and headaches. Negative for weakness and numbness.  Psychiatric/Behavioral: Negative.        Objective:   Physical Exam Constitutional:      General: He is not in acute distress.    Appearance: He is well-developed. He is not diaphoretic.     Comments: Frail, walks with a walker   HENT:     Head: Normocephalic and atraumatic.     Right Ear: External ear normal.     Left Ear: External ear normal.     Nose: Nose normal.     Mouth/Throat:     Pharynx: No oropharyngeal exudate.  Eyes:     General: No scleral icterus.       Right eye: No discharge.        Left eye: No discharge.     Conjunctiva/sclera: Conjunctivae normal.     Pupils: Pupils are equal, round, and reactive to light.  Neck:     Thyroid: No thyromegaly.     Vascular: No JVD.     Trachea: No tracheal deviation.   Cardiovascular:     Rate and Rhythm: Normal rate and regular rhythm.     Heart sounds: Normal heart sounds. No murmur heard.   No friction rub. No gallop.  Pulmonary:     Effort: Pulmonary effort is normal. No respiratory distress.     Breath sounds: Normal breath sounds. No wheezing or rales.  Chest:     Chest wall: No tenderness.  Abdominal:     General: Bowel sounds are normal. There is no distension.     Palpations: Abdomen is soft. There is no mass.     Tenderness: There is no abdominal tenderness. There is no guarding or rebound.  Genitourinary:    Penis: No tenderness.   Musculoskeletal:        General: No tenderness. Normal range of motion.     Cervical back: Neck supple.  Lymphadenopathy:     Cervical: No cervical adenopathy.  Skin:    General: Skin is warm and dry.     Coloration: Skin is not pale.     Findings: No erythema or rash.  Neurological:     Mental Status: He is alert and oriented to person, place, and time.  Cranial Nerves: No cranial nerve deficit.     Motor: No abnormal muscle tone.     Coordination: Coordination normal.     Deep Tendon Reflexes: Reflexes are normal and symmetric. Reflexes normal.     Comments: Motor is 4 out of 4 in both arms and both legs , He takes his time speaking but there is no dysarthria   Psychiatric:        Behavior: Behavior normal.        Thought Content: Thought content normal.        Judgment: Judgment normal.          Assessment & Plan:  He seems to have had an event of some sort one month ago, possibly a mild stroke. We will set up a brain MRI soon to evaluate this further. His HTN and CAD and CHF are well controlled. He sees Urology for the prostate cancer. We will get fasting labs today to check lipids, TSH, and a BMET. We spent a total of ( 34  ) minutes reviewing records and discussing these issues.  Alysia Penna, MD

## 2021-02-03 ENCOUNTER — Inpatient Hospital Stay: Payer: Medicare HMO | Admitting: Oncology

## 2021-02-03 ENCOUNTER — Inpatient Hospital Stay: Payer: Medicare HMO | Attending: Oncology

## 2021-02-03 ENCOUNTER — Other Ambulatory Visit: Payer: Self-pay

## 2021-02-03 VITALS — BP 131/71 | HR 65 | Temp 97.9°F | Resp 17 | Ht 69.0 in | Wt 158.2 lb

## 2021-02-03 DIAGNOSIS — C7989 Secondary malignant neoplasm of other specified sites: Secondary | ICD-10-CM | POA: Diagnosis not present

## 2021-02-03 DIAGNOSIS — Z79899 Other long term (current) drug therapy: Secondary | ICD-10-CM | POA: Insufficient documentation

## 2021-02-03 DIAGNOSIS — N1330 Unspecified hydronephrosis: Secondary | ICD-10-CM | POA: Diagnosis not present

## 2021-02-03 DIAGNOSIS — Z923 Personal history of irradiation: Secondary | ICD-10-CM | POA: Insufficient documentation

## 2021-02-03 DIAGNOSIS — D630 Anemia in neoplastic disease: Secondary | ICD-10-CM | POA: Diagnosis not present

## 2021-02-03 DIAGNOSIS — C61 Malignant neoplasm of prostate: Secondary | ICD-10-CM | POA: Insufficient documentation

## 2021-02-03 LAB — CMP (CANCER CENTER ONLY)
ALT: 21 U/L (ref 0–44)
AST: 19 U/L (ref 15–41)
Albumin: 3.2 g/dL — ABNORMAL LOW (ref 3.5–5.0)
Alkaline Phosphatase: 59 U/L (ref 38–126)
Anion gap: 6 (ref 5–15)
BUN: 23 mg/dL (ref 8–23)
CO2: 27 mmol/L (ref 22–32)
Calcium: 8.9 mg/dL (ref 8.9–10.3)
Chloride: 108 mmol/L (ref 98–111)
Creatinine: 1.08 mg/dL (ref 0.61–1.24)
GFR, Estimated: >60 mL/min (ref >60)
Glucose, Bld: 89 mg/dL (ref 70–99)
Potassium: 4.6 mmol/L (ref 3.5–5.1)
Sodium: 141 mmol/L (ref 135–145)
Total Bilirubin: 0.7 mg/dL (ref 0.3–1.2)
Total Protein: 6.3 g/dL — ABNORMAL LOW (ref 6.5–8.1)

## 2021-02-03 LAB — CBC WITH DIFFERENTIAL (CANCER CENTER ONLY)
Abs Immature Granulocytes: 0.01 10*3/uL (ref 0.00–0.07)
Basophils Absolute: 0 10*3/uL (ref 0.0–0.1)
Basophils Relative: 1 %
Eosinophils Absolute: 0.1 10*3/uL (ref 0.0–0.5)
Eosinophils Relative: 2 %
HCT: 35.5 % — ABNORMAL LOW (ref 39.0–52.0)
Hemoglobin: 12 g/dL — ABNORMAL LOW (ref 13.0–17.0)
Immature Granulocytes: 0 %
Lymphocytes Relative: 15 %
Lymphs Abs: 0.7 10*3/uL (ref 0.7–4.0)
MCH: 31.4 pg (ref 26.0–34.0)
MCHC: 33.8 g/dL (ref 30.0–36.0)
MCV: 92.9 fL (ref 80.0–100.0)
Monocytes Absolute: 0.5 10*3/uL (ref 0.1–1.0)
Monocytes Relative: 10 %
Neutro Abs: 3.5 10*3/uL (ref 1.7–7.7)
Neutrophils Relative %: 72 %
Platelet Count: 150 10*3/uL (ref 150–400)
RBC: 3.82 MIL/uL — ABNORMAL LOW (ref 4.22–5.81)
RDW: 13.3 % (ref 11.5–15.5)
WBC Count: 4.8 10*3/uL (ref 4.0–10.5)
nRBC: 0 % (ref 0.0–0.2)

## 2021-02-03 NOTE — Progress Notes (Signed)
Hematology and Oncology Follow Up Visit  [name] [id number] [birth year] 87 y.o. 02/03/2021 9:18 AM [name], MDFry, Ishmael Holter, MD   Principle Diagnosis: 87 year old man with advanced prostate cancer with pelvic recurrence diagnosed in 2018.  He has castration-sensitive after presenting with localized disease, Gleason score 3+4 = 7 in 2011.  Prior Therapy:  He was on active surveillance between 2011 and 2018.  He developed relapsed disease with pelvic mass arising from the prostate and was started on Lupron at that time.  He is status post radiation therapy total of 44 Gy in 20 fractions to the pelvic nodes as well as boost to the prostate of 60 Gy 20 fractions.  I was completed in October 2019.  Current therapy:  Androgen deprivation under the care of Puschinsky in [facility].   Interim History: [name] presents today for a follow-up visit.  Since last visit, he reports no major changes in his health.  He still ambulates with the help of a walker and denies any recent falls or syncope he denies any recent hospitalizations or illnesses.  He is under evaluation for possible TIA according to his daughter.  He denies any hematuria or dysuria.  He has reported some urinary frequency and occasional loose bowel habits no other complaints at this time.       Medications: Reviewed without changes. Current Outpatient Medications  Medication Sig Dispense Refill   amoxicillin (AMOXIL) 500 MG capsule Take 2,000 mg by mouth as directed. Prior to dental appointment  1   atorvastatin (LIPITOR) 10 MG tablet TAKE ONE TABLET BY MOUTH DAILY AT 6PM 90 tablet 2   Cholecalciferol (VITAMIN D3 PO) Take 2,000 Units by mouth daily.     Cyanocobalamin 2500 MCG SUBL Place 1 tablet under the tongue daily.     ferrous sulfate 325 (65 FE) MG EC tablet TAKE ONE TABLET BY MOUTH DAILY 90 tablet 0   fish oil-omega-3 fatty acids 1000 MG capsule Take 1 g by mouth every morning.     Leuprolide Acetate, 6  Month, (LUPRON) 45 MG injection Inject 45 mg into the muscle every 3 (three) months.      levothyroxine (SYNTHROID) 50 MCG tablet TAKE ONE TABLET BY MOUTH EVERY MORNING 90 tablet 2   metoprolol tartrate (LOPRESSOR) 25 MG tablet TAKE ONE TABLET BY MOUTH TWICE A DAY 180 tablet 3   nitroGLYCERIN (NITROSTAT) 0.4 MG SL tablet Place 1 tablet (0.4 mg total) under the tongue every 5 (five) minutes as needed for chest pain. 25 tablet 3   vitamin C (ASCORBIC ACID) 500 MG tablet Take 500 mg by mouth every morning.     No current facility-administered medications for this visit.     Allergies:  Allergies  Allergen Reactions   Other Other (See Comments)    ANESTHESIA.... 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 ANESTHESIA.... 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 Occurred post op CABG 2015, required readmission and rehab      Physical Exam:     Blood pressure 131/71, pulse 65, temperature 97.9 F (36.6 C), temperature source Temporal, resp. rate 17, height 5\' 9"  (1.753 m), weight 158 lb 3.2 oz (71.8 kg), SpO2 100 %.    ECOG:  1   General appearance: Comfortable appearing without any discomfort Head: Normocephalic without any trauma Oropharynx: Mucous membranes are moist and pink without any thrush or ulcers. Eyes: Pupils are equal and round reactive to light. Lymph nodes: No cervical, supraclavicular,  inguinal or axillary lymphadenopathy.   Heart:regular rate and rhythm.  S1 and S2 without leg edema. Lung: Clear without any rhonchi or wheezes.  No dullness to percussion. Abdomin: Soft, nontender, nondistended with good bowel sounds.  No hepatosplenomegaly. Musculoskeletal: No joint deformity or effusion.  Full range of motion noted. Neurological: No deficits noted on motor, sensory and deep tendon reflex exam. Skin: No petechial rash or dryness.  Appeared moist.           Lab Results: Lab Results  Component Value Date   WBC 4.7 07/23/2020    HGB 11.3 (L) 07/23/2020   HCT 34.0 (L) 07/23/2020   MCV 92.1 07/23/2020   PLT 149 (L) 07/23/2020     Chemistry      Component Value Date/Time   NA 141 01/28/2021 1454   K 4.5 01/28/2021 1454   CL 106 01/28/2021 1454   CO2 27 01/28/2021 1454   BUN 20 01/28/2021 1454   CREATININE 1.06 01/28/2021 1454   CREATININE 1.11 07/23/2020 0911   CREATININE 1.07 01/03/2020 1149      Component Value Date/Time   CALCIUM 9.4 01/28/2021 1454   ALKPHOS 57 07/23/2020 0911   AST 23 07/23/2020 0911   ALT 30 07/23/2020 0911   BILITOT 0.6 07/23/2020 0911       Latest Reference Range & Units 12/04/19 12:37 07/23/20 09:11  Prostate Specific Ag, Serum 0.0 - 4.0 ng/mL <0.1 <0.1      Impression and Plan:     87 year old man with:   1.    Advanced prostate cancer with local pelvic recurrence diagnosed in 2018.  He continues to have undetectable PSA after local and systemic therapy.  He has tolerated androgen deprivation therapy alone without any additional need for treatment.  Additional therapy including androgen receptor pathway inhibitors such as enzalutamide, abiraterone among others were reiterated.  The plan is to update his staging scans before the next visit and will continue to monitor PSA in the interim.  He is agreeable with this plan.   2.  Androgen deprivation: He is currently receiving that every 6 months under the care of his urologist.   3.   Osteoporosis: He continues to be on calcium and vitamin D supplements.  4.  Hydronephrosis: He is status post cystoscopy and ureteral stent placement completed in October 2022 by Dr. Harlow Asa.  5.  Anemia: Related to malignancy and treatment as well as chronic disease.  Hemoglobin is mild and does not require any intervention.   6.  Follow-up: In 6 months for repeat follow-up.  30  minutes were dedicated to this encounter.  The time was spent on reviewing laboratory data, disease status update and outlining future plan of care  discussion.      [name], MD 12/13/20229:18 AM

## 2021-02-04 ENCOUNTER — Other Ambulatory Visit: Payer: Self-pay | Admitting: Family Medicine

## 2021-02-04 ENCOUNTER — Telehealth: Payer: Self-pay | Admitting: *Deleted

## 2021-02-04 LAB — PROSTATE-SPECIFIC AG, SERUM (LABCORP): Prostate Specific Ag, Serum: <0.1 ng/mL (ref 0.0–4.0)

## 2021-02-04 NOTE — Telephone Encounter (Signed)
-----   Message from Wyatt Portela, MD sent at 02/04/2021  8:28 AM EST ----- Please let him know his PSA is down

## 2021-02-04 NOTE — Telephone Encounter (Signed)
PC to patient, informed him his PSA is <0.1    He verbalizes understanding.

## 2021-02-13 DIAGNOSIS — M79672 Pain in left foot: Secondary | ICD-10-CM | POA: Diagnosis not present

## 2021-02-13 DIAGNOSIS — L84 Corns and callosities: Secondary | ICD-10-CM | POA: Diagnosis not present

## 2021-02-13 DIAGNOSIS — M79671 Pain in right foot: Secondary | ICD-10-CM | POA: Diagnosis not present

## 2021-02-13 DIAGNOSIS — B351 Tinea unguium: Secondary | ICD-10-CM | POA: Diagnosis not present

## 2021-03-10 ENCOUNTER — Ambulatory Visit
Admission: RE | Admit: 2021-03-10 | Discharge: 2021-03-10 | Disposition: A | Discharge status: Home / Self Care | Payer: Medicare HMO | Source: Ambulatory Visit | Attending: Family Medicine | Admitting: Family Medicine

## 2021-03-10 ENCOUNTER — Other Ambulatory Visit: Payer: Self-pay

## 2021-03-10 DIAGNOSIS — G319 Degenerative disease of nervous system, unspecified: Secondary | ICD-10-CM | POA: Diagnosis not present

## 2021-03-10 DIAGNOSIS — I6782 Cerebral ischemia: Secondary | ICD-10-CM | POA: Diagnosis not present

## 2021-03-10 DIAGNOSIS — R519 Headache, unspecified: Secondary | ICD-10-CM

## 2021-03-10 DIAGNOSIS — I619 Nontraumatic intracerebral hemorrhage, unspecified: Secondary | ICD-10-CM | POA: Diagnosis not present

## 2021-03-10 DIAGNOSIS — I6381 Other cerebral infarction due to occlusion or stenosis of small artery: Secondary | ICD-10-CM | POA: Diagnosis not present

## 2021-03-10 MED ORDER — GADOBENATE DIMEGLUMINE 529 MG/ML IV SOLN
15.0000 mL | Freq: Once | INTRAVENOUS | Status: AC | PRN
  Administered 2021-03-10: 15 mL via INTRAVENOUS

## 2021-03-19 DIAGNOSIS — C61 Malignant neoplasm of prostate: Secondary | ICD-10-CM | POA: Diagnosis not present

## 2021-04-08 DIAGNOSIS — I252 Old myocardial infarction: Secondary | ICD-10-CM | POA: Diagnosis not present

## 2021-04-08 DIAGNOSIS — Z01810 Encounter for preprocedural cardiovascular examination: Secondary | ICD-10-CM | POA: Diagnosis not present

## 2021-04-08 DIAGNOSIS — N135 Crossing vessel and stricture of ureter without hydronephrosis: Secondary | ICD-10-CM | POA: Diagnosis not present

## 2021-04-08 DIAGNOSIS — N1330 Unspecified hydronephrosis: Secondary | ICD-10-CM | POA: Diagnosis not present

## 2021-04-08 DIAGNOSIS — K8020 Calculus of gallbladder without cholecystitis without obstruction: Secondary | ICD-10-CM | POA: Diagnosis not present

## 2021-04-08 DIAGNOSIS — I4891 Unspecified atrial fibrillation: Secondary | ICD-10-CM | POA: Diagnosis not present

## 2021-04-08 DIAGNOSIS — C61 Malignant neoplasm of prostate: Secondary | ICD-10-CM | POA: Diagnosis not present

## 2021-04-24 DIAGNOSIS — C61 Malignant neoplasm of prostate: Secondary | ICD-10-CM | POA: Diagnosis not present

## 2021-05-01 DIAGNOSIS — L84 Corns and callosities: Secondary | ICD-10-CM | POA: Diagnosis not present

## 2021-05-01 DIAGNOSIS — M79672 Pain in left foot: Secondary | ICD-10-CM | POA: Diagnosis not present

## 2021-05-01 DIAGNOSIS — B351 Tinea unguium: Secondary | ICD-10-CM | POA: Diagnosis not present

## 2021-05-01 DIAGNOSIS — M79671 Pain in right foot: Secondary | ICD-10-CM | POA: Diagnosis not present

## 2021-05-05 ENCOUNTER — Telehealth: Payer: Self-pay | Admitting: Pharmacist

## 2021-05-05 NOTE — Chronic Care Management (AMB) (Signed)
? ? ?  Chronic Care Management ?Pharmacy Assistant  ? ?Name: [name].  MRN: [id number] DOB: Sep 03, 1927 ? ?05/05/21 APPOINTMENT REMINDER ? ? ?Called Patient No answer, left message of appointment on 05/06/21 at 3 via telephone visit with Jeni Salles, Pharm D.  ? ?Notified to have all medications, supplements, blood pressure and/or blood sugar logs available during appointment and to return call if need to reschedule. ? ?  ? ?Care Gaps: ?Urine Micro - Overdue ?Zoster Vaccine - Overdue ?COVID Booster - Overdue ?Bp- 131/71 (02/21/21) ?AWV- 7/22 ? ?Star Rating Drug: ?Atorvastatin (Lipitor) 10 mg - Last filled 02/04/21 90 DS at [facility] ? ?Any gaps in medications fill history? ?None ? ? ?SIG:   ? ? ?Medications: ?Outpatient Encounter Medications as of 05/05/2021  ?Medication Sig Note  ? amoxicillin (AMOXIL) 500 MG capsule Take 2,000 mg by mouth as directed. Prior to dental appointment   ? atorvastatin (LIPITOR) 10 MG tablet TAKE ONE TABLET BY MOUTH DAILY AT 6PM   ? Cholecalciferol (VITAMIN D3 PO) Take 2,000 Units by mouth daily.   ? Cyanocobalamin 2500 MCG SUBL Place 1 tablet under the tongue daily.   ? ferrous sulfate 325 (65 FE) MG EC tablet TAKE ONE TABLET BY MOUTH DAILY   ? fish oil-omega-3 fatty acids 1000 MG capsule Take 1 g by mouth every morning.   ? Leuprolide Acetate, 6 Month, (LUPRON) 45 MG injection Inject 45 mg into the muscle every 3 (three) months.    ? levothyroxine (SYNTHROID) 50 MCG tablet TAKE ONE TABLET BY MOUTH EVERY MORNING   ? metoprolol tartrate (LOPRESSOR) 25 MG tablet TAKE ONE TABLET BY MOUTH TWICE A DAY   ? nitroGLYCERIN (NITROSTAT) 0.4 MG SL tablet Place 1 tablet (0.4 mg total) under the tongue every 5 (five) minutes as needed for chest pain. 06/16/2020: On hand  ? vitamin C (ASCORBIC ACID) 500 MG tablet Take 500 mg by mouth every morning.   ? ?No facility-administered encounter medications on file as of 05/05/2021.  ? ? ?Ned Clines CMA ?Clinical Pharmacist  Assistant ?[phone] ? ?

## 2021-05-06 ENCOUNTER — Telehealth: Payer: Medicare HMO

## 2021-05-06 NOTE — Progress Notes (Deleted)
? ?Chronic Care Management ?Pharmacy Note ? ?05/06/2021 ?Name:  [name]. MRN:  [id number] DOB:  1927-08-31 ? ?Summary: ?Pt does not check BP at home but does have balance issues ?LDL at goal < 70 ? ?Recommendations/Changes made from today's visit: ?-Recommended stopping fish oil due to controlled triglycerides and increased risk for bleeding ?-Recommended monitoring BP at home ? ?Plan: ?BP assessment in 3 months ? ?Subjective: ?[name]. is an 88 y.o. year old male who is a primary patient of [name], MD.  The CCM team was consulted for assistance with disease management and care coordination needs.   ? ?Engaged with patient by telephone for follow up visit in response to provider referral for pharmacy case management and/or care coordination services.  ? ?Consent to Services:  ?The patient was given information about Chronic Care Management services, agreed to services, and gave verbal consent prior to initiation of services.  Please see initial visit note for detailed documentation.  ? ?Patient Care Team: ?[name], MD as PCP - General (Family Medicine) ?[name], [facility] as Pharmacist (Pharmacist) ? ?Recent office visits: ?01/28/21 Alysia Penna, MD: Patient presented for annual exam. Plan for MRI as patient had possible stroke and MRI showed previous strokes.  ? ?Recent consult visits: ?02/03/21 Wyatt Portela, MD (Oncology) - Patient presented for Malignant neoplasm of prostate. No medication changes. ? ?12/17/20 [facility]insky[facility] (Urology) - Patient presented for pre-op testing. No other visit details available.  ? ?08-06-2020 Hyman Hopes, MD  (Ophthalmology) - Patient presented for a post op exam. No medication changes. ?  ?06-16-2020 Doran Stabler, MD (Gastroenterology) - Patient presented for Iron deficiency anemia due to chronic blood loss and other concerns. Stopped Pantoprazole 40 mg. ? ?Hospital visits: ?04/08/21 Puschinsky, [facility]arris Has  (Urology) - Patient presented to [place] for CYTOSCOPY,BILATERAL RETROGRADES , LEFT URETEROSCOPY AND DILATION, LEFT STENT CHANGE, FLUOROSCOPY. Prescribed ciprofloxacin BID x 5 days. ?  ? ?Objective: ? ?Lab Results  ?Component Value Date  ? CREATININE 1.08 02/03/2021  ? BUN 23 02/03/2021  ? GFR 61.42 01/28/2021  ? GFRNONAA >60 02/03/2021  ? GFRAA 70 01/03/2020  ? NA 141 02/03/2021  ? K 4.6 02/03/2021  ? CALCIUM 8.9 02/03/2021  ? CO2 27 02/03/2021  ? GLUCOSE 89 02/03/2021  ? ? ?Lab Results  ?Component Value Date/Time  ? HGBA1C 5.6 07/01/2016 01:40 PM  ? HGBA1C 5.6 01/27/2016 11:27 AM  ? GFR 61.42 01/28/2021 02:54 PM  ? GFR 53.16 (L) 06/16/2020 11:06 AM  ?  ?Last diabetic Eye exam: No results found for: HMDIABEYEEXA  ?Last diabetic Foot exam: No results found for: HMDIABFOOTEX  ? ?Lab Results  ?Component Value Date  ? CHOL 121 01/28/2021  ? HDL 51.10 01/28/2021  ? LDLCALC 55 01/28/2021  ? LDLDIRECT 159.3 05/17/2007  ? TRIG 76.0 01/28/2021  ? CHOLHDL 2 01/28/2021  ? ? ?Hepatic Function Latest Ref Rng & Units 02/03/2021 07/23/2020 07/07/2020  ?Total Protein 6.5 - 8.1 g/dL 6.3(L) 6.2(L) 6.6  ?Albumin 3.5 - 5.0 g/dL 3.2(L) 3.1(L) 3.5  ?AST 15 - 41 U/L '19 23 25  ' ?ALT 0 - 44 U/L '21 30 23  ' ?Alk Phosphatase 38 - 126 U/L 59 57 55  ?Total Bilirubin 0.3 - 1.2 mg/dL 0.7 0.6 0.8  ?Bilirubin, Direct 0.0 - 0.2 mg/dL - - -  ? ? ?Lab Results  ?Component Value Date/Time  ? TSH 2.42 01/28/2021 02:54 PM  ? TSH 4.37  06/16/2020 11:06 AM  ? FREET4 0.93 06/16/2020 11:06 AM  ? FREET4 1.2 01/03/2020 11:49 AM  ? ? ?CBC Latest Ref Rng & Units 02/03/2021 07/23/2020 07/07/2020  ?WBC 4.0 - 10.5 K/uL 4.8 4.7 6.2  ?Hemoglobin 13.0 - 17.0 g/dL 12.0(L) 11.3(L) 11.9(L)  ?Hematocrit 39.0 - 52.0 % 35.5(L) 34.0(L) 37.0(L)  ?Platelets 150 - 400 K/uL 150 149(L) 157  ? ? ?Lab Results  ?Component Value Date/Time  ? VD25OH 39 01/03/2020 11:49 AM  ? VD25OH 39 08/02/2007 10:41 AM  ? ? ?Clinical ASCVD: Yes  ?The ASCVD Risk score (Arnett DK, et  al., 2019) failed to calculate for the following reasons: ?  The 2019 ASCVD risk score is only valid for ages 61 to 42   ? ?Depression screen [facility] 2/9 01/28/2021 09/10/2020 01/03/2020  ?Decreased Interest 0 0 0  ?Down, Depressed, Hopeless 0 0 0  ?PHQ - 2 Score 0 0 0  ?Some recent data might be hidden  ?  ?CHA2DS2/VAS Stroke Risk Points  Current as of 4 days ago  ?   6 >= 2 Points: High Risk  ?1 - 1.99 Points: Medium Risk  ?0 Points: Low Risk  ?  Last Change: N/A   ?  ? Details   ? This score determines the patient's risk of having a stroke if the  ?patient has atrial fibrillation.  ?  ?  ? Points Metrics  ?1 Has Congestive Heart Failure:  Yes   ? Current as of 4 days ago  ?1 Has Vascular Disease:  Yes   ? Current as of 4 days ago  ?1 Has Hypertension:  Yes   ? Current as of 4 days ago  ?2 Age:  89   ? Current as of 4 days ago  ?1 Has Diabetes:  Yes    ? Current as of 4 days ago  ?0 Had Stroke:  No  Had TIA:  No  Had Thromboembolism:  No   ? Current as of 4 days ago  ?0 Male:  No   ? Current as of 4 days ago  ?  ?  ? ?Social History  ? ?Tobacco Use  ?Smoking Status Never  ?Smokeless Tobacco Never  ? ?BP Readings from Last 3 Encounters:  ?02/03/21 131/71  ?01/28/21 130/70  ?07/23/20 110/60  ? ?Pulse Readings from Last 3 Encounters:  ?02/03/21 65  ?01/28/21 (!) 49  ?07/23/20 (!) 59  ? ?Wt Readings from Last 3 Encounters:  ?02/03/21 158 lb 3.2 oz (71.8 kg)  ?01/28/21 156 lb 9.6 oz (71 kg)  ?07/23/20 160 lb 3.2 oz (72.7 kg)  ? ?BMI Readings from Last 3 Encounters:  ?02/03/21 23.36 kg/m?  ?01/28/21 23.13 kg/m?  ?07/23/20 23.66 kg/m?  ? ? ?Assessment/Interventions: Review of patient past medical history, allergies, medications, health status, including review of consultants reports, laboratory and other test data, was performed as part of comprehensive evaluation and provision of chronic care management services.  ? ?SDOH:  (Social Determinants of Health) assessments and interventions performed: Yes ? ? ?SDOH Screenings   ? ?Alcohol Screen: Not on file  ?Depression (PHQ2-9): Low Risk   ? PHQ-2 Score: 0  ?Financial Resource Strain: Low Risk   ? Difficulty of Paying Living Expenses: Not hard at all  ?Food Insecurity: No Food Insecurity  ? Worried About Charity fundraiser in the Last Year: Never true  ? Ran Out of Food in the Last Year: Never true  ?Housing: Low Risk   ? Last Housing Risk  Score: 0  ?Physical Activity: Inactive  ? Days of Exercise per Week: 0 days  ? Minutes of Exercise per Session: 0 min  ?Social Connections: Moderately Integrated  ? Frequency of Communication with Friends and Family: More than three times a week  ? Frequency of Social Gatherings with Friends and Family: More than three times a week  ? Attends Religious Services: More than 4 times per year  ? Active Member of Clubs or Organizations: No  ? Attends Archivist Meetings: Never  ? Marital Status: Married  ?Stress: No Stress Concern Present  ? Feeling of Stress : Not at all  ?Tobacco Use: Low Risk   ? Smoking Tobacco Use: Never  ? Smokeless Tobacco Use: Never  ? Passive Exposure: Not on file  ?Transportation Needs: No Transportation Needs  ? Lack of Transportation (Medical): No  ? Lack of Transportation (Non-Medical): No  ? ?Patient lives in a retirement community and they provide his meals and they have labeled it as "nourishing". They were providing fresh fruit and he was eating that a lot but was causing diarrhea. ? ?Patient reads the information that comes with medications and is not always happy with what he reads.  ? ?Patient has terminal prostate cancer and is taking the Lupron injections for this. He has also had radiation therapy and had 40 sessions of this. Patient is satisfied with the care he is getting over there and goes to see his oncologist about 2 times a year. ? ?Patient is following with Dr. Loletha Carrow with GI problems. He was having lots of black stool. He recommended lactose free milk and this helped with a bit at first. He is  still struggling to identify what foods cause his diarrhea. Patient does enjoy cookies and eats them some. Patient has been dealing with diarrhea on and off since 2019. Recommended Align probiotics. ? ?[place]

## 2021-05-08 ENCOUNTER — Other Ambulatory Visit: Payer: Self-pay | Admitting: Family Medicine

## 2021-06-24 ENCOUNTER — Ambulatory Visit (INDEPENDENT_AMBULATORY_CARE_PROVIDER_SITE_OTHER): Payer: Medicare HMO | Admitting: Family Medicine

## 2021-06-24 ENCOUNTER — Encounter: Payer: Self-pay | Admitting: Family Medicine

## 2021-06-24 VITALS — BP 110/68 | HR 74 | Temp 98.7°F | Wt 159.0 lb

## 2021-06-24 DIAGNOSIS — R600 Localized edema: Secondary | ICD-10-CM

## 2021-06-24 NOTE — Progress Notes (Signed)
? ?  Subjective:  ? ? Patient ID: [name]., male    DOB: [birth year], 88 y.o.   MRN: [id number] ? ?HPI ?Here with his daughter to check on swelling in his lower legs and feet. He has had this for years but lately it has been more pronounced. Sometimes his feet get a light purple color and sometimes they have pain. No SOB. He had labs drawn last December showing normal renal function. His weight has not changed at all over the last year.  ? ? ?Review of Systems  ?Constitutional: Negative.   ?HENT: Negative.    ?Eyes: Negative.   ?Respiratory: Negative.    ?Cardiovascular:  Positive for leg swelling.  ?Gastrointestinal: Negative.   ?Genitourinary: Negative.   ?Musculoskeletal: Negative.   ?Skin: Negative.   ?Neurological: Negative.   ?Psychiatric/Behavioral: Negative.    ? ?   ?Objective:  ? Physical Exam ?Constitutional:   ?   General: He is not in acute distress. ?   Comments: Walks with a walker   ?Cardiovascular:  ?   Rate and Rhythm: Normal rate. Rhythm irregular.  ?   Pulses: Normal pulses.  ?   Heart sounds: Normal heart sounds.  ?Pulmonary:  ?   Effort: Pulmonary effort is normal.  ?   Breath sounds: Normal breath sounds.  ?Musculoskeletal:  ?   Comments: 2+ edema in both lower legs and feet. Skin color is normal   ?Neurological:  ?   Mental Status: He is alert.  ? ? ? ? ? ?   ?Assessment & Plan:  ?He has some benign venous insufficiency. I advised him to elevate his legs when he is sitting for prolonged periods, and he can wear compression stockings in the daytime. Otherwise he is doing well.  ?Alysia Penna, MD ? ? ?

## 2021-07-03 DIAGNOSIS — C61 Malignant neoplasm of prostate: Secondary | ICD-10-CM | POA: Diagnosis not present

## 2021-07-15 DIAGNOSIS — C61 Malignant neoplasm of prostate: Secondary | ICD-10-CM | POA: Diagnosis not present

## 2021-07-15 DIAGNOSIS — N132 Hydronephrosis with renal and ureteral calculous obstruction: Secondary | ICD-10-CM | POA: Diagnosis not present

## 2021-07-15 DIAGNOSIS — N135 Crossing vessel and stricture of ureter without hydronephrosis: Secondary | ICD-10-CM | POA: Diagnosis not present

## 2021-07-17 ENCOUNTER — Other Ambulatory Visit: Payer: Self-pay | Admitting: Family Medicine

## 2021-07-23 ENCOUNTER — Telehealth: Payer: Self-pay | Admitting: Oncology

## 2021-07-23 NOTE — Telephone Encounter (Signed)
Called patient regarding upcoming appointments, left a voicemail. 

## 2021-07-28 ENCOUNTER — Inpatient Hospital Stay: Payer: Medicare HMO | Attending: Oncology

## 2021-07-28 ENCOUNTER — Ambulatory Visit (HOSPITAL_COMMUNITY)
Admission: RE | Admit: 2021-07-28 | Discharge: 2021-07-28 | Disposition: A | Discharge status: Home / Self Care | Payer: Medicare HMO | Source: Ambulatory Visit | Attending: Oncology | Admitting: Oncology

## 2021-07-28 ENCOUNTER — Encounter (HOSPITAL_COMMUNITY): Payer: Self-pay

## 2021-07-28 ENCOUNTER — Other Ambulatory Visit: Payer: Self-pay

## 2021-07-28 DIAGNOSIS — K449 Diaphragmatic hernia without obstruction or gangrene: Secondary | ICD-10-CM | POA: Insufficient documentation

## 2021-07-28 DIAGNOSIS — I700 Atherosclerosis of aorta: Secondary | ICD-10-CM | POA: Diagnosis not present

## 2021-07-28 DIAGNOSIS — C61 Malignant neoplasm of prostate: Secondary | ICD-10-CM | POA: Insufficient documentation

## 2021-07-28 DIAGNOSIS — Z8546 Personal history of malignant neoplasm of prostate: Secondary | ICD-10-CM | POA: Diagnosis not present

## 2021-07-28 DIAGNOSIS — K8020 Calculus of gallbladder without cholecystitis without obstruction: Secondary | ICD-10-CM | POA: Insufficient documentation

## 2021-07-28 DIAGNOSIS — K5730 Diverticulosis of large intestine without perforation or abscess without bleeding: Secondary | ICD-10-CM | POA: Insufficient documentation

## 2021-07-28 LAB — CBC WITH DIFFERENTIAL (CANCER CENTER ONLY)
Abs Immature Granulocytes: 0.01 10*3/uL (ref 0.00–0.07)
Basophils Absolute: 0 10*3/uL (ref 0.0–0.1)
Basophils Relative: 1 %
Eosinophils Absolute: 0.1 10*3/uL (ref 0.0–0.5)
Eosinophils Relative: 1 %
HCT: 34.3 % — ABNORMAL LOW (ref 39.0–52.0)
Hemoglobin: 11.3 g/dL — ABNORMAL LOW (ref 13.0–17.0)
Immature Granulocytes: 0 %
Lymphocytes Relative: 10 %
Lymphs Abs: 0.5 10*3/uL — ABNORMAL LOW (ref 0.7–4.0)
MCH: 30.8 pg (ref 26.0–34.0)
MCHC: 32.9 g/dL (ref 30.0–36.0)
MCV: 93.5 fL (ref 80.0–100.0)
Monocytes Absolute: 0.5 10*3/uL (ref 0.1–1.0)
Monocytes Relative: 11 %
Neutro Abs: 3.9 10*3/uL (ref 1.7–7.7)
Neutrophils Relative %: 77 %
Platelet Count: 140 10*3/uL — ABNORMAL LOW (ref 150–400)
RBC: 3.67 MIL/uL — ABNORMAL LOW (ref 4.22–5.81)
RDW: 13.4 % (ref 11.5–15.5)
WBC Count: 5 10*3/uL (ref 4.0–10.5)
nRBC: 0 % (ref 0.0–0.2)

## 2021-07-28 LAB — CMP (CANCER CENTER ONLY)
ALT: 11 U/L (ref 0–44)
AST: 15 U/L (ref 15–41)
Albumin: 3.4 g/dL — ABNORMAL LOW (ref 3.5–5.0)
Alkaline Phosphatase: 49 U/L (ref 38–126)
Anion gap: 3 — ABNORMAL LOW (ref 5–15)
BUN: 24 mg/dL — ABNORMAL HIGH (ref 8–23)
CO2: 29 mmol/L (ref 22–32)
Calcium: 9.1 mg/dL (ref 8.9–10.3)
Chloride: 109 mmol/L (ref 98–111)
Creatinine: 1.16 mg/dL (ref 0.61–1.24)
GFR, Estimated: 59 mL/min — ABNORMAL LOW (ref >60)
Glucose, Bld: 84 mg/dL (ref 70–99)
Potassium: 4 mmol/L (ref 3.5–5.1)
Sodium: 141 mmol/L (ref 135–145)
Total Bilirubin: 0.5 mg/dL (ref 0.3–1.2)
Total Protein: 6.1 g/dL — ABNORMAL LOW (ref 6.5–8.1)

## 2021-07-30 DIAGNOSIS — C61 Malignant neoplasm of prostate: Secondary | ICD-10-CM | POA: Diagnosis not present

## 2021-07-30 LAB — PROSTATE-SPECIFIC AG, SERUM (LABCORP): Prostate Specific Ag, Serum: <0.1 ng/mL (ref 0.0–4.0)

## 2021-08-04 ENCOUNTER — Inpatient Hospital Stay: Payer: Medicare HMO

## 2021-08-11 ENCOUNTER — Telehealth: Payer: Self-pay | Admitting: Oncology

## 2021-08-11 ENCOUNTER — Inpatient Hospital Stay: Payer: Medicare HMO | Admitting: Oncology

## 2021-08-11 NOTE — Telephone Encounter (Signed)
Scheduled per 6/20 in basket, pt daughter confirmed appt

## 2021-08-14 ENCOUNTER — Other Ambulatory Visit: Payer: Self-pay | Admitting: Family Medicine

## 2021-08-14 DIAGNOSIS — B351 Tinea unguium: Secondary | ICD-10-CM | POA: Diagnosis not present

## 2021-08-14 DIAGNOSIS — M79671 Pain in right foot: Secondary | ICD-10-CM | POA: Diagnosis not present

## 2021-08-14 DIAGNOSIS — M79672 Pain in left foot: Secondary | ICD-10-CM | POA: Diagnosis not present

## 2021-08-14 DIAGNOSIS — L84 Corns and callosities: Secondary | ICD-10-CM | POA: Diagnosis not present

## 2021-08-21 ENCOUNTER — Other Ambulatory Visit: Payer: Self-pay | Admitting: Family Medicine

## 2021-08-21 ENCOUNTER — Inpatient Hospital Stay: Payer: Medicare HMO | Admitting: Oncology

## 2021-09-07 ENCOUNTER — Telehealth: Payer: Self-pay | Admitting: Family Medicine

## 2021-09-07 NOTE — Telephone Encounter (Signed)
Left message for patient to call back and schedule Medicare Annual Wellness Visit (AWV) either virtually or in office. Left  my Herbie Drape number [phone]   Last AWV ;09/10/20  please schedule at anytime with [facility] Nurse Health Advisor 1 or 2

## 2021-09-07 NOTE — Telephone Encounter (Signed)
Patient returned my call He declined stating he has a lot going on right now.   And would like a call back in 2024

## 2021-09-21 ENCOUNTER — Other Ambulatory Visit: Payer: Self-pay | Admitting: Family Medicine

## 2021-09-22 ENCOUNTER — Ambulatory Visit: Payer: Medicare HMO

## 2021-09-29 ENCOUNTER — Inpatient Hospital Stay: Payer: Medicare HMO | Admitting: Oncology

## 2021-10-06 DIAGNOSIS — C61 Malignant neoplasm of prostate: Secondary | ICD-10-CM | POA: Diagnosis not present

## 2021-10-28 DIAGNOSIS — I252 Old myocardial infarction: Secondary | ICD-10-CM | POA: Diagnosis not present

## 2021-10-28 DIAGNOSIS — C61 Malignant neoplasm of prostate: Secondary | ICD-10-CM | POA: Diagnosis not present

## 2021-10-28 DIAGNOSIS — N135 Crossing vessel and stricture of ureter without hydronephrosis: Secondary | ICD-10-CM | POA: Diagnosis not present

## 2021-10-28 DIAGNOSIS — K8020 Calculus of gallbladder without cholecystitis without obstruction: Secondary | ICD-10-CM | POA: Diagnosis not present

## 2021-11-30 ENCOUNTER — Telehealth: Payer: Self-pay | Admitting: Family Medicine

## 2021-11-30 NOTE — Telephone Encounter (Signed)
States there is incorrect info on FL2 form. Would like to correct the form and send back for provider to approve corrections. Asks for callback today if possible

## 2021-12-01 ENCOUNTER — Telehealth: Payer: Self-pay | Admitting: Family Medicine

## 2021-12-01 NOTE — Telephone Encounter (Signed)
Duplicate, please disregard.

## 2021-12-01 NOTE — Telephone Encounter (Signed)
[facility] / Gerald Stabs calling to follow up [phone]  [facility] to leave a detailed message on this line

## 2021-12-02 ENCOUNTER — Encounter: Payer: Self-pay | Admitting: Family Medicine

## 2021-12-02 ENCOUNTER — Ambulatory Visit (INDEPENDENT_AMBULATORY_CARE_PROVIDER_SITE_OTHER): Payer: Medicare HMO | Admitting: Family Medicine

## 2021-12-02 VITALS — BP 118/72 | HR 68 | Temp 98.2°F | Ht 69.0 in | Wt 156.4 lb

## 2021-12-02 DIAGNOSIS — N390 Urinary tract infection, site not specified: Secondary | ICD-10-CM | POA: Diagnosis not present

## 2021-12-02 DIAGNOSIS — R350 Frequency of micturition: Secondary | ICD-10-CM

## 2021-12-02 DIAGNOSIS — W19XXXA Unspecified fall, initial encounter: Secondary | ICD-10-CM | POA: Diagnosis not present

## 2021-12-02 DIAGNOSIS — R8290 Unspecified abnormal findings in urine: Secondary | ICD-10-CM

## 2021-12-02 LAB — POC URINALSYSI DIPSTICK (AUTOMATED)
Bilirubin, UA: NEGATIVE
Glucose, UA: NEGATIVE
Ketones, UA: NEGATIVE
Nitrite, UA: NEGATIVE
Protein, UA: POSITIVE — AB
Spec Grav, UA: 1.025 (ref 1.010–1.025)
Urobilinogen, UA: 0.2 E.U./dL
pH, UA: 6 (ref 5.0–8.0)

## 2021-12-02 MED ORDER — CIPROFLOXACIN HCL 500 MG PO TABS: 500.0000 mg | ORAL_TABLET | Freq: Two times a day (BID) | ORAL | 0 refills | Status: AC

## 2021-12-02 NOTE — Progress Notes (Signed)
   Subjective:    Patient ID: Luis Buba., male    DOB: February 22, 1928, 89 y.o.   MRN: [id number]  HPI Here with his son to follow up after he had a fall in his apartment one week ago. He has a clear memory of this. There was no head trauma or LOC. He says he was reaching for something in a kitchen cabinet and he lost his balance. He was able to lean against the cabinets and he slid down them onto the floor. After a short time, he was able to get his wife's attention and she called for help. He denies any injuries from the fall, however his family says he has been confused, talking about strange things, and showing more memory problems than usual. They have also noticed a foul odor to his urine. No fever or  nausea. No chest pain or cough or SOB.    Review of Systems  Constitutional: Negative.   Respiratory: Negative.    Cardiovascular: Negative.   Neurological:  Negative for dizziness, tremors, seizures, syncope, facial asymmetry, speech difficulty, numbness and headaches.       Objective:   Physical Exam Constitutional:      Comments: In a wheelchair.   Cardiovascular:     Rate and Rhythm: Normal rate and regular rhythm.     Pulses: Normal pulses.     Heart sounds: Normal heart sounds.  Pulmonary:     Effort: Pulmonary effort is normal.     Breath sounds: Normal breath sounds.  Musculoskeletal:     Comments: No obvious injuries   Neurological:     Mental Status: He is alert.     Comments: He has trouble finding his words today but there are no signs of confusion            Assessment & Plan:  He has a UTI, and this is likely the cause of his recent confusion and may have contributed to the fall. He will drink plenty of water. Treat with 10 days of Cipro. We could not get a culture due to insufficient urine. They will return in 10 days for a follow up check.  Alysia Penna, MD

## 2021-12-02 NOTE — Telephone Encounter (Signed)
Pt has an office visit this afternoon and Dr Sarajane Jews completed the [facility] at the visit. A copy was sent to scanning and pt was given the original forms

## 2021-12-03 ENCOUNTER — Other Ambulatory Visit: Payer: Self-pay | Admitting: Family Medicine

## 2021-12-07 ENCOUNTER — Non-Acute Institutional Stay (SKILLED_NURSING_FACILITY): Payer: Medicare HMO | Admitting: Orthopedic Surgery

## 2021-12-07 ENCOUNTER — Encounter: Payer: Self-pay | Admitting: Orthopedic Surgery

## 2021-12-07 DIAGNOSIS — K219 Gastro-esophageal reflux disease without esophagitis: Secondary | ICD-10-CM | POA: Diagnosis not present

## 2021-12-07 DIAGNOSIS — E039 Hypothyroidism, unspecified: Secondary | ICD-10-CM

## 2021-12-07 DIAGNOSIS — I2510 Atherosclerotic heart disease of native coronary artery without angina pectoris: Secondary | ICD-10-CM

## 2021-12-07 DIAGNOSIS — D509 Iron deficiency anemia, unspecified: Secondary | ICD-10-CM | POA: Diagnosis not present

## 2021-12-07 DIAGNOSIS — C61 Malignant neoplasm of prostate: Secondary | ICD-10-CM

## 2021-12-07 DIAGNOSIS — I5032 Chronic diastolic (congestive) heart failure: Secondary | ICD-10-CM

## 2021-12-07 DIAGNOSIS — R2681 Unsteadiness on feet: Secondary | ICD-10-CM | POA: Diagnosis not present

## 2021-12-07 DIAGNOSIS — F01B0 Vascular dementia, moderate, without behavioral disturbance, psychotic disturbance, mood disturbance, and anxiety: Secondary | ICD-10-CM

## 2021-12-07 DIAGNOSIS — R69 Illness, unspecified: Secondary | ICD-10-CM | POA: Diagnosis not present

## 2021-12-07 DIAGNOSIS — I10 Essential (primary) hypertension: Secondary | ICD-10-CM

## 2021-12-07 DIAGNOSIS — N368 Other specified disorders of urethra: Secondary | ICD-10-CM

## 2021-12-07 NOTE — Progress Notes (Deleted)
Location:  [place] Room Number: 28/A Place of Service:  SNF [phone]) Provider:  Windell Moulding NP  [name], MD  Patient Care Team: [name], MD as PCP - General (Family Medicine) [name], [facility] as Pharmacist (Pharmacist)  Extended Emergency Contact Information Primary Emergency Contact: Doty,Geraldine Address: [address]          [place] [address] [place] of [place] Phone: [phone] Relation: Spouse Secondary Emergency Contact: Tennis Must Mobile Phone: [phone] Relation: Daughter  Code Status:  Living will  DNR  Managed Care Goals of care: Advanced Directive information    12/07/2021    4:53 PM  Advanced Directives  Does Patient Have a Medical Advance Directive? Yes  Type of Advance Directive Living will;Healthcare Power of Attorney  Does patient want to make changes to medical advance directive? No - Patient declined  Copy of [place] in Chart? Yes - validated most recent copy scanned in chart (See row information)     Chief Complaint  Patient presents with   Acute Visit    Unstable gait    HPI:  Pt is a 88 y.o. male seen today for an acute visit for    Past Medical History:  Diagnosis Date   Complication of anesthesia    "serious cognisance problems since my OR in January" (03/13/2013   Coronary artery disease    Diverticulosis    Hiatal hernia    Hx of echocardiogram    Echo 5/16:  Mild focal basal septal hypertrophy, EF 55%, mild AI, MV repair ok with trivial MR (mean 3 mmHg), mild LAE, mild RAE, PASP 35 mmHg   Hypertension    Hypothyroidism    Irritable bowel syndrome    PAF (paroxysmal atrial fibrillation) ([place]) 05/03/2014   Personal history of colonic polyps    tubular adenoma   Pleural effusion, bilateral 03/12/2013   Small to moderate, L>R   Prostate cancer [facility]) 1991   sees Dr. Carlota Raspberry at [facility], observation only    S/P CABG x 2 02/28/2013   LIMA to LAD,  SVG to OM1, EVH via right thigh   S/P Maze operation for atrial fibrillation 02/28/2013   Complete bilateral atrial lesion set using bipolar radiofrequency and cryothermy ablation with clipping of LA appendage   S/P mitral valve repair, maze procedure, and CABG x2 02/28/2013   Complex valvuloplasty including triangular resection of posterior leaflet, 30 mm Sorin Memo 3D ring annuloplasty   Past Surgical History:  Procedure Laterality Date   CARDIAC CATHETERIZATION  2015   CATARACT EXTRACTION, BILATERAL Bilateral 2014   per Dr. Gershon Crane    COLONOSCOPY  2011   per Dr. Sharlett Iles, benign polyps, no repeats needed    CORONARY ARTERY BYPASS GRAFT N/A 02/28/2013   Procedure: CORONARY ARTERY BYPASS GRAFTING (CABG);  Surgeon: Rexene Alberts, MD;  Location: [place];  Service: Open Heart Surgery;  Laterality: N/A;  CABG x 2,  using left internal mammary artery and right leg saphenous vein harvested endoscopically   ESOPHAGOGASTRODUODENOSCOPY (EGD) WITH PROPOFOL N/A 01/11/2019   Procedure: ESOPHAGOGASTRODUODENOSCOPY (EGD) WITH PROPOFOL;  Surgeon: Doran Stabler, MD;  [facility];  Service: Gastroenterology;  Laterality: N/A;   HEMORRHOID SURGERY     INTRAOPERATIVE TRANSESOPHAGEAL ECHOCARDIOGRAM N/A 02/28/2013   Procedure: INTRAOPERATIVE TRANSESOPHAGEAL ECHOCARDIOGRAM;  Surgeon: Rexene Alberts, MD;  Location: [place];  Service: Open Heart Surgery;  Laterality: N/A;   LEFT HEART CATHETERIZATION WITH CORONARY ANGIOGRAM  N/A 02/26/2013   Procedure: LEFT HEART CATHETERIZATION WITH CORONARY ANGIOGRAM;  Surgeon: Leonie Man, MD;  Location: [facility] CATH LAB;  Service: Cardiovascular;  Laterality: N/A;   MAZE N/A 02/28/2013   Procedure: MAZE;  Surgeon: Rexene Alberts, MD;  Location: [place];  Service: Open Heart Surgery;  Laterality: N/A;   MITRAL VALVE REPAIR N/A 02/28/2013   Procedure: MITRAL VALVE REPAIR (MVR);  Surgeon: Rexene Alberts, MD;  Location: [place];  Service: Open Heart Surgery;  Laterality: N/A;    PROSTATE BIOPSY     TRANSURETHRAL RESECTION OF PROSTATE N/A 02/04/2017   Procedure: TRANSURETHRAL RESECTION OF THE PROSTATE (TURP);  Surgeon: Alexis Frock, MD;  [facility];  Service: Urology;  Laterality: N/A;    Allergies  Allergen Reactions   Other Other (See Comments)    ANESTHESIA.... 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 ANESTHESIA.... 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 Occurred post op CABG 2015, required readmission and rehab    Allergies as of 12/07/2021       Reactions   Other Other (See Comments)   ANESTHESIA.... 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 ANESTHESIA.... 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 Occurred post op CABG 2015, required readmission and rehab        Medication List        Accurate as of December 07, 2021  4:55 PM. If you have any questions, ask your nurse or doctor.          atorvastatin 10 MG tablet Commonly known as: LIPITOR TAKE 1 TABLET EVERY DAY AT 6PM   ciprofloxacin 500 MG tablet Commonly known as: Cipro Take 1 tablet (500 mg total) by mouth 2 (two) times daily for 10 days.   Cyanocobalamin 2500 MCG Subl Place 1 tablet under the tongue daily.   ferrous sulfate 325 (65 FE) MG EC tablet TAKE 1 TABLET BY MOUTH EVERY DAY   fish oil-omega-3 fatty acids 1000 MG capsule Take 1 g by mouth every morning.   Leuprolide Acetate (6 Month) 45 MG injection Commonly known as: LUPRON Inject 45 mg into the muscle every 3 (three) months.   levothyroxine 50 MCG tablet Commonly known as: SYNTHROID TAKE 1 TABLET BY MOUTH IN THE MORNING   metoprolol tartrate 25 MG tablet Commonly known as: LOPRESSOR TAKE ONE TABLET BY MOUTH TWICE A DAY   nitroGLYCERIN 0.4 MG SL tablet Commonly known as: NITROSTAT Place 1 tablet (0.4 mg total) under the tongue every 5 (five) minutes as needed for chest pain.   VITAMIN D3 PO Take 2,000 Units by mouth daily.         Review of Systems  Immunization History  Administered Date(s) Administered   Fluad Quad(high Dose 65+) 11/21/2018, 12/25/2019, 01/02/2021   Influenza Split 11/01/2011   Influenza Whole 12/15/2006, 12/06/2007, 12/31/2009   Influenza, High Dose Seasonal PF 11/11/2014, 11/12/2015, 11/15/2016, 11/18/2017, 11/21/2018, 01/02/2021   Influenza,inj,Quad PF,6+ Mos 11/01/2012, 11/09/2013   Influenza-Unspecified 11/01/2011, 11/01/2012, 11/09/2013   Moderna SARS-COV2 Booster Vaccination 01/07/2020   Moderna Sars-Covid-2 Vaccination 02/26/2019, 03/26/2019   Pneumococcal Conjugate-13 11/09/2013   Pneumococcal Polysaccharide-23 02/23/2004   Td 02/22/2002   Tdap 01/22/2014   Pertinent  Health Maintenance Due  Topic Date Due   INFLUENZA VACCINE  09/22/2021   COLONOSCOPY (Pts 45-26yr Insurance coverage will need to be confirmed)  Discontinued      01/03/2020   10:58 AM 07/07/2020    1:49 PM 09/10/2020   11:13  AM 01/28/2021    1:23 PM 12/02/2021    2:35 PM  Fall Risk  Falls in the past year? 1  0 1 1  Was there an injury with Fall? 0  0 0 0  Fall Risk Category Calculator 2  0 2 2  Fall Risk Category Moderate  Low Moderate Moderate  Patient Fall Risk Level  Low fall risk  Moderate fall risk Moderate fall risk  Patient at Risk for Falls Due to   Impaired balance/gait;Impaired mobility Impaired mobility;Impaired balance/gait History of fall(s)  Fall risk Follow up   Falls prevention discussed Falls evaluation completed Falls evaluation completed;Falls prevention discussed   Functional Status Survey:    Vitals:   12/07/21 1613  BP: 135/64  Pulse: 63  Resp: 16  Temp: 97.9 F (36.6 C)  SpO2: 94%  Weight: 186 lb 9.6 oz (84.6 kg)   Body mass index is 27.56 kg/m. Physical Exam  Labs reviewed: Recent Labs    01/28/21 1454 02/03/21 0950 07/28/21 1025  NA 141 141 141  K 4.5 4.6 4.0  CL 106 108 109  CO2 '27 27 29  '$ GLUCOSE 82 89 84  BUN 20 23 24*  CREATININE 1.06 1.08 1.16   CALCIUM 9.4 8.9 9.1   Recent Labs    02/03/21 0950 07/28/21 1025  AST 19 15  ALT 21 11  ALKPHOS 59 49  BILITOT 0.7 0.5  PROT 6.3* 6.1*  ALBUMIN 3.2* 3.4*   Recent Labs    02/03/21 0950 07/28/21 1025  WBC 4.8 5.0  NEUTROABS 3.5 3.9  HGB 12.0* 11.3*  HCT 35.5* 34.3*  MCV 92.9 93.5  PLT 150 140*   Lab Results  Component Value Date   TSH 2.42 01/28/2021   Lab Results  Component Value Date   HGBA1C 5.6 07/01/2016   Lab Results  Component Value Date   CHOL 121 01/28/2021   HDL 51.10 01/28/2021   LDLCALC 55 01/28/2021   LDLDIRECT 159.3 05/17/2007   TRIG 76.0 01/28/2021   CHOLHDL 2 01/28/2021    Significant Diagnostic Results in last 30 days:  No results found.  Assessment/Plan There are no diagnoses linked to this encounter.   Family/ staff Communication:   Labs/tests ordered:

## 2021-12-07 NOTE — Progress Notes (Unsigned)
Location:  [place] Room Number: 28/A Place of Service:  SNF [phone]) Provider:  Yvonna Alanis, NP   [name], MD  Patient Care Team: [name], MD as PCP - General (Family Medicine) [name], [facility] as Pharmacist (Pharmacist)  Extended Emergency Contact Information Primary Emergency Contact: Acebo,Geraldine Address: [address]          [place] [address] [place] of [place] Phone: [phone] Relation: Spouse Secondary Emergency Contact: Tennis Must Mobile Phone: [phone] Relation: Daughter  Code Status:  DNR Goals of care: Advanced Directive information    09/10/2020   11:13 AM  Advanced Directives  Does Patient Have a Medical Advance Directive? Yes  Type of Advance Directive Living will     Chief Complaint  Patient presents with   Acute Visit    Unstable gait    HPI:  Pt is a 88 y.o. male seen today for acute visit due to unstable gait.    Past Medical History:  Diagnosis Date   Complication of anesthesia    "serious cognisance problems since my OR in January" (03/13/2013   Coronary artery disease    Diverticulosis    Hiatal hernia    Hx of echocardiogram    Echo 5/16:  Mild focal basal septal hypertrophy, EF 55%, mild AI, MV repair ok with trivial MR (mean 3 mmHg), mild LAE, mild RAE, PASP 35 mmHg   Hypertension    Hypothyroidism    Irritable bowel syndrome    PAF (paroxysmal atrial fibrillation) ([place]) 05/03/2014   Personal history of colonic polyps    tubular adenoma   Pleural effusion, bilateral 03/12/2013   Small to moderate, L>R   Prostate cancer [facility]) 1991   sees Dr. Carlota Raspberry at [facility], observation only    S/P CABG x 2 02/28/2013   LIMA to LAD, SVG to OM1, EVH via right thigh   S/P Maze operation for atrial fibrillation 02/28/2013   Complete bilateral atrial lesion set using bipolar radiofrequency and cryothermy ablation with clipping of LA appendage   S/P mitral valve repair, maze  procedure, and CABG x2 02/28/2013   Complex valvuloplasty including triangular resection of posterior leaflet, 30 mm Sorin Memo 3D ring annuloplasty   Past Surgical History:  Procedure Laterality Date   CARDIAC CATHETERIZATION  2015   CATARACT EXTRACTION, BILATERAL Bilateral 2014   per Dr. Gershon Crane    COLONOSCOPY  2011   per Dr. Sharlett Iles, benign polyps, no repeats needed    CORONARY ARTERY BYPASS GRAFT N/A 02/28/2013   Procedure: CORONARY ARTERY BYPASS GRAFTING (CABG);  Surgeon: Rexene Alberts, MD;  Location: [place];  Service: Open Heart Surgery;  Laterality: N/A;  CABG x 2,  using left internal mammary artery and right leg saphenous vein harvested endoscopically   ESOPHAGOGASTRODUODENOSCOPY (EGD) WITH PROPOFOL N/A 01/11/2019   Procedure: ESOPHAGOGASTRODUODENOSCOPY (EGD) WITH PROPOFOL;  Surgeon: Doran Stabler, MD;  [facility];  Service: Gastroenterology;  Laterality: N/A;   HEMORRHOID SURGERY     INTRAOPERATIVE TRANSESOPHAGEAL ECHOCARDIOGRAM N/A 02/28/2013   Procedure: INTRAOPERATIVE TRANSESOPHAGEAL ECHOCARDIOGRAM;  Surgeon: Rexene Alberts, MD;  Location: [place];  Service: Open Heart Surgery;  Laterality: N/A;   LEFT HEART CATHETERIZATION WITH CORONARY ANGIOGRAM N/A 02/26/2013   Procedure: LEFT HEART CATHETERIZATION WITH CORONARY ANGIOGRAM;  Surgeon: Leonie Man, MD;  Location: [facility] CATH LAB;  Service: Cardiovascular;  Laterality: N/A;   MAZE N/A 02/28/2013   Procedure: MAZE;  Surgeon: Rexene Alberts, MD;  [facility];  Service: Open Heart Surgery;  Laterality: N/A;   MITRAL VALVE REPAIR N/A 02/28/2013   Procedure: MITRAL VALVE REPAIR (MVR);  Surgeon: Rexene Alberts, MD;  Location: [place];  Service: Open Heart Surgery;  Laterality: N/A;   PROSTATE BIOPSY     TRANSURETHRAL RESECTION OF PROSTATE N/A 02/04/2017   Procedure: TRANSURETHRAL RESECTION OF THE PROSTATE (TURP);  Surgeon: Alexis Frock, MD;  [facility];  Service: Urology;  Laterality: N/A;    Allergies  Allergen  Reactions   Other Other (See Comments)    ANESTHESIA.... 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 ANESTHESIA.... 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 Occurred post op CABG 2015, required readmission and rehab    Outpatient Encounter Medications as of 12/07/2021  Medication Sig   atorvastatin (LIPITOR) 10 MG tablet TAKE 1 TABLET EVERY DAY AT 6PM   Cholecalciferol (VITAMIN D3 PO) Take 2,000 Units by mouth daily.   ciprofloxacin (CIPRO) 500 MG tablet Take 1 tablet (500 mg total) by mouth 2 (two) times daily for 10 days.   Cyanocobalamin 2500 MCG SUBL Place 1 tablet under the tongue daily.   ferrous sulfate 325 (65 FE) MG EC tablet TAKE 1 TABLET BY MOUTH EVERY DAY   fish oil-omega-3 fatty acids 1000 MG capsule Take 1 g by mouth every morning.   Leuprolide Acetate, 6 Month, (LUPRON) 45 MG injection Inject 45 mg into the muscle every 3 (three) months.    levothyroxine (SYNTHROID) 50 MCG tablet TAKE 1 TABLET BY MOUTH IN THE MORNING   metoprolol tartrate (LOPRESSOR) 25 MG tablet TAKE ONE TABLET BY MOUTH TWICE A DAY   nitroGLYCERIN (NITROSTAT) 0.4 MG SL tablet Place 1 tablet (0.4 mg total) under the tongue every 5 (five) minutes as needed for chest pain.   No facility-administered encounter medications on file as of 12/07/2021.    Review of Systems  Immunization History  Administered Date(s) Administered   Fluad Quad(high Dose 65+) 11/21/2018, 12/25/2019, 01/02/2021   Influenza Split 11/01/2011   Influenza Whole 12/15/2006, 12/06/2007, 12/31/2009   Influenza, High Dose Seasonal PF 11/11/2014, 11/12/2015, 11/15/2016, 11/18/2017, 11/21/2018, 01/02/2021   Influenza,inj,Quad PF,6+ Mos 11/01/2012, 11/09/2013   Influenza-Unspecified 11/01/2011, 11/01/2012, 11/09/2013   Moderna SARS-COV2 Booster Vaccination 01/07/2020   Moderna Sars-Covid-2 Vaccination 02/26/2019, 03/26/2019   Pneumococcal Conjugate-13 11/09/2013   Pneumococcal Polysaccharide-23 02/23/2004    Td 02/22/2002   Tdap 01/22/2014   Pertinent  Health Maintenance Due  Topic Date Due   INFLUENZA VACCINE  09/22/2021   COLONOSCOPY (Pts 45-52yr Insurance coverage will need to be confirmed)  Discontinued      01/03/2020   10:58 AM 07/07/2020    1:49 PM 09/10/2020   11:13 AM 01/28/2021    1:23 PM 12/02/2021    2:35 PM  Fall Risk  Falls in the past year? 1  0 1 1  Was there an injury with Fall? 0  0 0 0  Fall Risk Category Calculator 2  0 2 2  Fall Risk Category Moderate  Low Moderate Moderate  Patient Fall Risk Level  Low fall risk  Moderate fall risk Moderate fall risk  Patient at Risk for Falls Due to   Impaired balance/gait;Impaired mobility Impaired mobility;Impaired balance/gait History of fall(s)  Fall risk Follow up   Falls prevention discussed Falls evaluation completed Falls evaluation completed;Falls prevention discussed   Functional Status Survey:    Vitals:   12/07/21 1613  BP: 135/64  Pulse: 63  Resp: 16  Temp: 97.9 F (36.6 C)  SpO2: 94%  Weight: 186 lb 9.6 oz (84.6 kg)   Body mass index is 27.56 kg/m. Physical Exam  Labs reviewed: Recent Labs    01/28/21 1454 02/03/21 0950 07/28/21 1025  NA 141 141 141  K 4.5 4.6 4.0  CL 106 108 109  CO2 '27 27 29  '$ GLUCOSE 82 89 84  BUN 20 23 24*  CREATININE 1.06 1.08 1.16  CALCIUM 9.4 8.9 9.1   Recent Labs    02/03/21 0950 07/28/21 1025  AST 19 15  ALT 21 11  ALKPHOS 59 49  BILITOT 0.7 0.5  PROT 6.3* 6.1*  ALBUMIN 3.2* 3.4*   Recent Labs    02/03/21 0950 07/28/21 1025  WBC 4.8 5.0  NEUTROABS 3.5 3.9  HGB 12.0* 11.3*  HCT 35.5* 34.3*  MCV 92.9 93.5  PLT 150 140*   Lab Results  Component Value Date   TSH 2.42 01/28/2021   Lab Results  Component Value Date   HGBA1C 5.6 07/01/2016   Lab Results  Component Value Date   CHOL 121 01/28/2021   HDL 51.10 01/28/2021   LDLCALC 55 01/28/2021   LDLDIRECT 159.3 05/17/2007   TRIG 76.0 01/28/2021   CHOLHDL 2 01/28/2021    Significant  Diagnostic Results in last 30 days:  No results found.  Assessment/Plan There are no diagnoses linked to this encounter.   Family/ staff Communication: ***  Labs/tests ordered:  ***

## 2021-12-10 ENCOUNTER — Encounter: Payer: Self-pay | Admitting: Internal Medicine

## 2021-12-10 ENCOUNTER — Non-Acute Institutional Stay (SKILLED_NURSING_FACILITY): Payer: Medicare HMO | Admitting: Internal Medicine

## 2021-12-10 DIAGNOSIS — N368 Other specified disorders of urethra: Secondary | ICD-10-CM | POA: Diagnosis not present

## 2021-12-10 DIAGNOSIS — I10 Essential (primary) hypertension: Secondary | ICD-10-CM

## 2021-12-10 DIAGNOSIS — C61 Malignant neoplasm of prostate: Secondary | ICD-10-CM

## 2021-12-10 DIAGNOSIS — K219 Gastro-esophageal reflux disease without esophagitis: Secondary | ICD-10-CM | POA: Diagnosis not present

## 2021-12-10 DIAGNOSIS — I5032 Chronic diastolic (congestive) heart failure: Secondary | ICD-10-CM | POA: Diagnosis not present

## 2021-12-10 DIAGNOSIS — R69 Illness, unspecified: Secondary | ICD-10-CM | POA: Diagnosis not present

## 2021-12-10 DIAGNOSIS — E039 Hypothyroidism, unspecified: Secondary | ICD-10-CM | POA: Diagnosis not present

## 2021-12-10 DIAGNOSIS — E079 Disorder of thyroid, unspecified: Secondary | ICD-10-CM | POA: Diagnosis not present

## 2021-12-10 DIAGNOSIS — D509 Iron deficiency anemia, unspecified: Secondary | ICD-10-CM

## 2021-12-10 DIAGNOSIS — I2510 Atherosclerotic heart disease of native coronary artery without angina pectoris: Secondary | ICD-10-CM | POA: Diagnosis not present

## 2021-12-10 DIAGNOSIS — E785 Hyperlipidemia, unspecified: Secondary | ICD-10-CM | POA: Diagnosis not present

## 2021-12-10 DIAGNOSIS — R2681 Unsteadiness on feet: Secondary | ICD-10-CM

## 2021-12-10 DIAGNOSIS — F01B0 Vascular dementia, moderate, without behavioral disturbance, psychotic disturbance, mood disturbance, and anxiety: Secondary | ICD-10-CM

## 2021-12-10 NOTE — Progress Notes (Signed)
Provider:   Location:  [place] Room Number: 28 [phone] Place of Service:  SNF (31)  PCP: Yvonna Alanis, NP Patient Care Team: Yvonna Alanis, NP as PCP - General (Adult Health Nurse Practitioner) [name], [facility] as Pharmacist (Pharmacist) Virgie Dad, MD as Consulting Physician (Internal Medicine)  Extended Emergency Contact Information Primary Emergency Contact: Eno,Geraldine Address: [address]          [place] [address] Johnnette Litter of [place] Phone: [phone] Relation: Spouse Secondary Emergency Contact: Tennis Must [place] Phone: [phone] Relation: Daughter  Code Status: DNR Goals of Care: Advanced Directive information    12/10/2021   11:46 AM  Advanced Directives  Does Patient Have a Medical Advance Directive? Yes  Type of Paramedic of [place];Living will;Out of facility DNR (pink MOST or yellow form)  Does patient want to make changes to medical advance directive? No - Patient declined  Copy of [place] in Chart? Yes - validated most recent copy scanned in chart (See row information)      Chief Complaint  Patient presents with   New Admit To SNF    Patient has had a new admission to snf   Immunizations    Discussed the need for shingles, covid and flu vaccine    HPI: Patient is a 88 y.o. male seen today for admission to SNF for long term management Patient has moved with his wife to skilled.  Due to cognitive decline  Patient has a history of CAD with CABG x2, hypertension, hyperlipidemia, CHF, mitral valve repair 2015,  history of GI bleed and hypothyroidism   prostate cancer diagnosed in 2012, underwent TURP in 2019 Now.  Has advanced and has affected left urethra.  Has had stent placed 11/11/2021 Also on Lupron injections every 3 months Patient also has now unstable gait and ambulates with wheelchair Cognitive decline Recent MRI shows small cortical  infarcts and needing more help with ADLs. Wife was unable to take care of him they both decided to move in SNF  Past Medical History:  Diagnosis Date   Complication of anesthesia    "serious cognisance problems since my OR in January" (03/13/2013   Coronary artery disease    Diverticulosis    Hiatal hernia    Hx of echocardiogram    Echo 5/16:  Mild focal basal septal hypertrophy, EF 55%, mild AI, MV repair ok with trivial MR (mean 3 mmHg), mild LAE, mild RAE, PASP 35 mmHg   Hypertension    Hypothyroidism    Irritable bowel syndrome    PAF (paroxysmal atrial fibrillation) ([place]) 05/03/2014   Personal history of colonic polyps    tubular adenoma   Pleural effusion, bilateral 03/12/2013   Small to moderate, L>R   Prostate cancer [facility]) 1991   sees Dr. Carlota Raspberry at [facility], observation only    S/P CABG x 2 02/28/2013   LIMA to LAD, SVG to OM1, EVH via right thigh   S/P Maze operation for atrial fibrillation 02/28/2013   Complete bilateral atrial lesion set using bipolar radiofrequency and cryothermy ablation with clipping of LA appendage   S/P mitral valve repair, maze procedure, and CABG x2 02/28/2013   Complex valvuloplasty including triangular resection of posterior leaflet, 30 mm Sorin Memo 3D ring annuloplasty   Past Surgical History:  Procedure Laterality Date   CARDIAC CATHETERIZATION  2015   CATARACT EXTRACTION, BILATERAL Bilateral 2014   per Dr. Gershon Crane  COLONOSCOPY  2011   per Dr. Sharlett Iles, benign polyps, no repeats needed    CORONARY ARTERY BYPASS GRAFT N/A 02/28/2013   Procedure: CORONARY ARTERY BYPASS GRAFTING (CABG);  Surgeon: Rexene Alberts, MD;  Location: [place];  Service: Open Heart Surgery;  Laterality: N/A;  CABG x 2,  using left internal mammary artery and right leg saphenous vein harvested endoscopically   ESOPHAGOGASTRODUODENOSCOPY (EGD) WITH PROPOFOL N/A 01/11/2019   Procedure: ESOPHAGOGASTRODUODENOSCOPY (EGD) WITH PROPOFOL;  Surgeon: Doran Stabler, MD;  Location:  [facility];  Service: Gastroenterology;  Laterality: N/A;   HEMORRHOID SURGERY     INTRAOPERATIVE TRANSESOPHAGEAL ECHOCARDIOGRAM N/A 02/28/2013   Procedure: INTRAOPERATIVE TRANSESOPHAGEAL ECHOCARDIOGRAM;  Surgeon: Rexene Alberts, MD;  Location: [place];  Service: Open Heart Surgery;  Laterality: N/A;   LEFT HEART CATHETERIZATION WITH CORONARY ANGIOGRAM N/A 02/26/2013   Procedure: LEFT HEART CATHETERIZATION WITH CORONARY ANGIOGRAM;  Surgeon: Leonie Man, MD;  Location: [facility] CATH LAB;  Service: Cardiovascular;  Laterality: N/A;   MAZE N/A 02/28/2013   Procedure: MAZE;  Surgeon: Rexene Alberts, MD;  Location: [place];  Service: Open Heart Surgery;  Laterality: N/A;   MITRAL VALVE REPAIR N/A 02/28/2013   Procedure: MITRAL VALVE REPAIR (MVR);  Surgeon: Rexene Alberts, MD;  Location: [place];  Service: Open Heart Surgery;  Laterality: N/A;   PROSTATE BIOPSY     TRANSURETHRAL RESECTION OF PROSTATE N/A 02/04/2017   Procedure: TRANSURETHRAL RESECTION OF THE PROSTATE (TURP);  Surgeon: Alexis Frock, MD;  Location: [facility];  Service: Urology;  Laterality: N/A;    reports that he has never smoked. He has never used smokeless tobacco. He reports that he does not drink alcohol and does not use drugs. Social History   Socioeconomic History   Marital status: Married    Spouse name: Mikle Bosworth   Number of children: 3   Years of education: Not on file   Highest education level: Not on file  Occupational History   Occupation: Retired  Tobacco Use   Smoking status: Never   Smokeless tobacco: Never  Vaping Use   Vaping Use: Never used  Substance and Sexual Activity   Alcohol use: No    Alcohol/week: 0.0 standard drinks of alcohol   Drug use: No   Sexual activity: Not Currently  Other Topics Concern   Not on file  Social History Narrative   Married retired Biomedical scientist   Lives at [facility], independent living though his wife is in a wheelchair and they help each other   1 son lives in  [place] a daughter lives in [place]   Never smoker no alcohol or drugs   Social Determinants of Health   Financial Resource Strain: Low Risk  (11/04/2020)   Overall Financial Resource Strain (CARDIA)    Difficulty of Paying Living Expenses: Not hard at all  Food Insecurity: No Food Insecurity (09/10/2020)   Hunger Vital Sign    Worried About Running Out of Food in the Last Year: Never true    [place] in the Last Year: Never true  Transportation Needs: No Transportation Needs (11/04/2020)   PRAPARE - Hydrologist (Medical): No    Lack of Transportation (Non-Medical): No  Physical Activity: Inactive (09/10/2020)   Exercise Vital Sign    Days of Exercise per Week: 0 days    Minutes of Exercise per Session: 0 min  Stress: No Stress Concern Present (09/10/2020)   [place]  Institute of Occupational Health - Occupational Stress Questionnaire    Feeling of Stress : Not at all  Social Connections: Moderately Integrated (09/10/2020)   Social Connection and Isolation Panel [NHANES]    Frequency of Communication with Friends and Family: More than three times a week    Frequency of Social Gatherings with Friends and Family: More than three times a week    Attends Religious Services: More than 4 times per year    Active Member of [employer] or Organizations: No    Attends Archivist Meetings: Never    Marital Status: Married  Human resources officer Violence: Not At Risk (09/10/2020)   Humiliation, Afraid, Rape, and Kick questionnaire    Fear of Current or Ex-Partner: No    Emotionally Abused: No    Physically Abused: No    Sexually Abused: No    Functional Status Survey:    Family History  Problem Relation Age of Onset   Hypertension Mother    Sudden death Mother    Stroke Mother    Heart disease Mother    Hypertension Father    Sudden death Father    Stroke Father    Heart disease Father    Heart disease Son    Diabetes Son     Lung cancer Brother    Stomach cancer Brother    Cancer Daughter        rebdomyosarcoma   Kidney cancer Brother        mets   Diabetes Sister     Health Maintenance  Topic Date Due   Zoster Vaccines- Shingrix (1 of 2) Never done   COVID-19 Vaccine (3 - Moderna risk series) 02/04/2020   INFLUENZA VACCINE  09/22/2021   TETANUS/TDAP  01/23/2024   Pneumonia Vaccine 14+ Years old  Completed   HPV VACCINES  Aged Out   COLONOSCOPY (Pts 45-33yr Insurance coverage will need to be confirmed)  Discontinued    Allergies  Allergen Reactions   Other Other (See Comments)    ANESTHESIA.... 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 ANESTHESIA.... 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 Occurred post op CABG 2015, required readmission and rehab    Outpatient Encounter Medications as of 12/10/2021  Medication Sig   atorvastatin (LIPITOR) 10 MG tablet TAKE 1 TABLET EVERY DAY AT 6PM   Cholecalciferol (VITAMIN D3 PO) Take 2,000 Units by mouth daily.   ciprofloxacin (CIPRO) 500 MG tablet Take 1 tablet (500 mg total) by mouth 2 (two) times daily for 10 days.   Cyanocobalamin 2500 MCG SUBL Place 1 tablet under the tongue daily.   ferrous sulfate 325 (65 FE) MG EC tablet TAKE 1 TABLET BY MOUTH EVERY DAY   fish oil-omega-3 fatty acids 1000 MG capsule Take 1 g by mouth every morning.   levothyroxine (SYNTHROID) 50 MCG tablet TAKE 1 TABLET BY MOUTH IN THE MORNING   metoprolol tartrate (LOPRESSOR) 25 MG tablet TAKE ONE TABLET BY MOUTH TWICE A DAY   nitroGLYCERIN (NITROSTAT) 0.4 MG SL tablet Place 1 tablet (0.4 mg total) under the tongue every 5 (five) minutes as needed for chest pain.   Leuprolide Acetate, 6 Month, (LUPRON) 45 MG injection Inject 45 mg into the muscle every 3 (three) months.  (Patient not taking: Reported on 12/10/2021)   No facility-administered encounter medications on file as of 12/10/2021.    Review of Systems  Constitutional:  Negative for  activity change, appetite change and unexpected weight change.  HENT: Negative.  Respiratory:  Negative for cough and shortness of breath.   Cardiovascular:  Negative for leg swelling.  Gastrointestinal:  Negative for constipation.  Genitourinary:  Positive for frequency.  Musculoskeletal:  Positive for gait problem. Negative for arthralgias and myalgias.  Skin: Negative.  Negative for rash.  Neurological:  Positive for weakness. Negative for dizziness.  Psychiatric/Behavioral:  Positive for confusion. Negative for sleep disturbance.   All other systems reviewed and are negative.   Vitals:   12/10/21 1136  BP: 126/66  Pulse: 77  Resp: 20  Temp: 97.7 F (36.5 C)  SpO2: 98%  Weight: 149 lb 4.8 oz (67.7 kg)   Body mass index is 22.05 kg/m. Physical Exam Vitals reviewed.  Constitutional:      Appearance: Normal appearance.  HENT:     Head: Normocephalic.     Nose: Nose normal.     Mouth/Throat:     Mouth: Mucous membranes are moist.     Pharynx: Oropharynx is clear.  Eyes:     Pupils: Pupils are equal, round, and reactive to light.  Cardiovascular:     Rate and Rhythm: Normal rate and regular rhythm.     Pulses: Normal pulses.  Pulmonary:     Effort: Pulmonary effort is normal. No respiratory distress.     Breath sounds: Normal breath sounds. No rales.  Abdominal:     General: Abdomen is flat. Bowel sounds are normal.     Palpations: Abdomen is soft.  Musculoskeletal:        General: Swelling present.     Cervical back: Neck supple.  Skin:    General: Skin is warm.  Neurological:     General: No focal deficit present.     Mental Status: He is alert.  Psychiatric:        Mood and Affect: Mood normal.        Thought Content: Thought content normal.     Labs reviewed: Basic Metabolic Panel: Recent Labs    01/28/21 1454 02/03/21 0950 07/28/21 1025  NA 141 141 141  K 4.5 4.6 4.0  CL 106 108 109  CO2 '27 27 29  '$ GLUCOSE 82 89 84  BUN 20 23 24*   CREATININE 1.06 1.08 1.16  CALCIUM 9.4 8.9 9.1   Liver Function Tests: Recent Labs    02/03/21 0950 07/28/21 1025  AST 19 15  ALT 21 11  ALKPHOS 59 49  BILITOT 0.7 0.5  PROT 6.3* 6.1*  ALBUMIN 3.2* 3.4*   No results for input(s): "LIPASE", "AMYLASE" in the last 8760 hours. No results for input(s): "AMMONIA" in the last 8760 hours. CBC: Recent Labs    02/03/21 0950 07/28/21 1025  WBC 4.8 5.0  NEUTROABS 3.5 3.9  HGB 12.0* 11.3*  HCT 35.5* 34.3*  MCV 92.9 93.5  PLT 150 140*   Cardiac Enzymes: No results for input(s): "CKTOTAL", "CKMB", "CKMBINDEX", "TROPONINI" in the last 8760 hours. BNP: Invalid input(s): "POCBNP" Lab Results  Component Value Date   HGBA1C 5.6 07/01/2016   Lab Results  Component Value Date   TSH 2.42 01/28/2021   Lab Results  Component Value Date   EUMPNTIR44 315 01/03/2020   Lab Results  Component Value Date   FOLATE 41.2 01/10/2019   Lab Results  Component Value Date   IRON 74 05/23/2019   FERRITIN 32.1 05/23/2019    Imaging and Procedures obtained prior to SNF admission: CT ABDOMEN PELVIS WO CONTRAST  Result Date: 07/29/2021 CLINICAL DATA:  Prostate cancer, assess treatment response. *  Tracking Code: BO * EXAM: CT ABDOMEN AND PELVIS WITHOUT CONTRAST TECHNIQUE: Multidetector CT imaging of the abdomen and pelvis was performed following the standard protocol without IV contrast. RADIATION DOSE REDUCTION: This exam was performed according to the departmental dose-optimization program which includes automated exposure control, adjustment of the mA and/or kV according to patient size and/or use of iterative reconstruction technique. COMPARISON:  Multiple priors including most recent CT abdomen pelvis Jul 07, 2020 FINDINGS: Lower chest: No acute abnormality. Large hiatal hernia/intrathoracic stomach with associated compressive atelectasis in the paramedian lower lobes. Mitral annular calcifications with mitral valve prosthesis. Prior median  sternotomy and CABG. Hepatobiliary: Scattered calcified hepatic granulomata. No suspicious hepatic lesion on this noncontrast examination. Cholelithiasis with calcified gallbladder wall (porcelain gallbladder). No biliary ductal dilation. Pancreas: No pancreatic ductal dilation or evidence of acute inflammation. Spleen: No splenomegaly or focal splenic lesion. Adrenals/Urinary Tract: Bilateral adrenal glands appear normal. Atrophic left kidney with unchanged position of the left double-J ureteral stent with pigtails in the left renal pelvis and urinary bladder. Similar to minimally increased soft tissue stranding about the left renal pelvis and proximal left ureter. Right kidney is unremarkable without hydronephrosis. Urinary bladder is decompressed. Stomach/Bowel: Radiopaque enteric contrast material traverses distal loops of small bowel. Large hiatal hernia/intrathoracic stomach. No pathologic dilation of small or large bowel. Sigmoid colonic diverticulosis without findings of acute diverticulitis. Surgical changes of rectal resection with reanastomosis similar prior. Vascular/Lymphatic: Extensive aortic and branch vessel atherosclerosis without abdominal aortic aneurysm. No pathologically enlarged abdominal or pelvic lymph nodes. Reproductive: Prior prostatectomy. Similar soft tissue thickening about the distal left ureter left mesorectal fascia measuring approximally 4.6 x 1.5 cm, unchanged. Other: Trace pelvic free fluid is similar prior. No walled off fluid collections. Musculoskeletal: Similar L5 superior endplate compression deformity. No aggressive lytic or blastic lesion of bone. IMPRESSION: 1. Post prostatectomy with similar soft tissue thickening about the distal left ureter and left mesorectal fascia/presacral space. 2. Unchanged position of the left double-J ureteral stent with similar to minimally increased stranding about the left renal pelvis and proximal ureter. No discrete hydronephrosis. 3. No  convincing evidence of new or progressive disease in the abdomen or pelvis on this noncontrast CT. 4. Large hiatal hernia/intrathoracic stomach appears similar to multiple priors. 5. Cholelithiasis in a porcelain gallbladder. 6. Colonic diverticulosis without findings of acute diverticulitis. 7.  Aortic Atherosclerosis (ICD10-I70.0). Electronically Signed   By: Dahlia Bailiff M.D.   On: 07/29/2021 14:42    Assessment/Plan 1. ADENOCARCINOMA, PROSTATE, GLEASON GRADE 3 Patient follows with DR Puchinsky On Lupron  2. Urethral obstruction S/p Stent  3. Unstable gait Uses Wheelchair and does his transfers but high risk for falls  4. Chronic diastolic CHF (congestive heart failure) (HCC) No Diuretics  5. Essential hypertension On Metoprolol  6. Gastroesophageal reflux disease without esophagitis   7. Acquired hypothyroidism TSH normal in 12/22  8. Moderate vascular dementia without behavioral disturbance, psychotic disturbance, mood disturbance, or anxiety ([place]) Needs help with his ADLS  Now will stay in SNF  9. Iron deficiency anemia, unspecified iron deficiency anemia type HGB stable  10. Coronary artery disease involving native coronary artery of native heart without angina pectoris On statin Not on Aspirin due to GI Bleed Lipid panel Pending   Family/ staff Communication:   Labs/tests ordered:

## 2021-12-11 LAB — BASIC METABOLIC PANEL
BUN: 21 (ref 4–21)
CO2: 28 — AB (ref 13–22)
Chloride: 103 (ref 99–108)
Creatinine: 1.1 (ref 0.6–1.3)
Glucose: 88
Potassium: 4.2 mEq/L (ref 3.5–5.1)
Sodium: 137 (ref 137–147)

## 2021-12-11 LAB — HEPATIC FUNCTION PANEL
ALT: 16 U/L (ref 10–40)
AST: 17 (ref 14–40)
Alkaline Phosphatase: 68 (ref 25–125)

## 2021-12-11 LAB — CBC AND DIFFERENTIAL
HCT: 36 — AB (ref 41–53)
Hemoglobin: 12.1 — AB (ref 13.5–17.5)
Neutrophils Absolute: 3738
Platelets: 239 10*3/uL (ref 150–400)
WBC: 5.1

## 2021-12-11 LAB — LIPID PANEL
Cholesterol: 122 (ref 0–200)
HDL: 51 (ref 35–70)
LDL Cholesterol: 55
Triglycerides: 75 (ref 40–160)

## 2021-12-11 LAB — COMPREHENSIVE METABOLIC PANEL
Albumin: 3.4 — AB (ref 3.5–5.0)
Calcium: 8.7 (ref 8.7–10.7)
Globulin: 2.8

## 2021-12-11 LAB — CBC: RBC: 3.99 (ref 3.87–5.11)

## 2021-12-15 DIAGNOSIS — E785 Hyperlipidemia, unspecified: Secondary | ICD-10-CM | POA: Diagnosis not present

## 2021-12-15 DIAGNOSIS — M6281 Muscle weakness (generalized): Secondary | ICD-10-CM | POA: Diagnosis not present

## 2021-12-15 DIAGNOSIS — R41841 Cognitive communication deficit: Secondary | ICD-10-CM | POA: Diagnosis not present

## 2021-12-15 DIAGNOSIS — R262 Difficulty in walking, not elsewhere classified: Secondary | ICD-10-CM | POA: Diagnosis not present

## 2021-12-16 ENCOUNTER — Other Ambulatory Visit: Payer: Self-pay | Admitting: Family Medicine

## 2021-12-16 ENCOUNTER — Ambulatory Visit: Payer: Medicare HMO | Admitting: Family Medicine

## 2021-12-16 DIAGNOSIS — R41841 Cognitive communication deficit: Secondary | ICD-10-CM | POA: Diagnosis not present

## 2021-12-16 DIAGNOSIS — M6281 Muscle weakness (generalized): Secondary | ICD-10-CM | POA: Diagnosis not present

## 2021-12-16 DIAGNOSIS — E785 Hyperlipidemia, unspecified: Secondary | ICD-10-CM | POA: Diagnosis not present

## 2021-12-16 DIAGNOSIS — R262 Difficulty in walking, not elsewhere classified: Secondary | ICD-10-CM | POA: Diagnosis not present

## 2021-12-18 ENCOUNTER — Non-Acute Institutional Stay (INDEPENDENT_AMBULATORY_CARE_PROVIDER_SITE_OTHER): Payer: Medicare HMO | Admitting: Orthopedic Surgery

## 2021-12-18 ENCOUNTER — Encounter: Payer: Self-pay | Admitting: Orthopedic Surgery

## 2021-12-18 DIAGNOSIS — R262 Difficulty in walking, not elsewhere classified: Secondary | ICD-10-CM | POA: Diagnosis not present

## 2021-12-18 DIAGNOSIS — M6281 Muscle weakness (generalized): Secondary | ICD-10-CM | POA: Diagnosis not present

## 2021-12-18 DIAGNOSIS — Z0000 Encounter for general adult medical examination without abnormal findings: Secondary | ICD-10-CM

## 2021-12-18 DIAGNOSIS — E785 Hyperlipidemia, unspecified: Secondary | ICD-10-CM | POA: Diagnosis not present

## 2021-12-18 DIAGNOSIS — R41841 Cognitive communication deficit: Secondary | ICD-10-CM | POA: Diagnosis not present

## 2021-12-18 NOTE — Progress Notes (Signed)
Subjective:   [name]. is a 88 y.o. male who presents for Medicare Annual/Subsequent preventive examination.  Place of Service: [facility] Provider: Windell Moulding, AGNP-C   Review of Systems     Cardiac Risk Factors include: advanced age (>53mn, >>33women);family history of premature cardiovascular disease;sedentary lifestyle;hypertension     Objective:    Today's Vitals   12/18/21 0958  BP: 123/70  Pulse: 62  Resp: 18  Temp: (!) 97.3 F (36.3 C)  SpO2: 97%  Weight: 147 lb (66.7 kg)  Height: '5\' 9"'$  (1.753 m)   Body mass index is 21.71 kg/m.     12/10/2021   11:46 AM 12/07/2021    4:53 PM 09/10/2020   11:13 AM 01/16/2019    6:03 PM 01/09/2019    9:16 PM 01/09/2019    3:59 PM 09/14/2018   12:30 PM  Advanced Directives  Does Patient Have a Medical Advance Directive? Yes Yes Yes Yes Yes Yes Yes  Type of AParamedicof [place]ap[place]gus[place] will;Out of facility DNR (pink MOST or yellow form) Living will;Healthcare Power of Attorney Living will [place]  Does patient want to make changes to medical advance directive? No - Patient declined No - Patient declined  No - Patient declined No - Patient declined Yes (ED - Information included in AVS)   Copy of [place] Chart? Yes - validated most recent copy scanned in chart (See row information) Yes - validated most recent copy scanned in chart (See row information)  No - copy requested No - copy requested      Current Medications (verified) Outpatient Encounter Medications as of 12/18/2021  Medication Sig   atorvastatin (LIPITOR) 10 MG tablet TAKE 1 TABLET EVERY DAY AT 6PM   Cholecalciferol (VITAMIN D3 PO) Take 2,000 Units by mouth daily.   Cyanocobalamin 2500 MCG SUBL Place 1 tablet under the tongue daily.   ferrous sulfate 325 (65 FE) MG EC tablet  TAKE 1 TABLET BY MOUTH EVERY DAY   fish oil-omega-3 fatty acids 1000 MG capsule Take 1 g by mouth every morning.   Leuprolide Acetate, 6 Month, (LUPRON) 45 MG injection Inject 45 mg into the muscle every 3 (three) months.  (Patient not taking: Reported on 12/10/2021)   levothyroxine (SYNTHROID) 50 MCG tablet TAKE 1 TABLET BY MOUTH IN THE MORNING   metoprolol tartrate (LOPRESSOR) 25 MG tablet TAKE ONE TABLET BY MOUTH TWICE A DAY   nitroGLYCERIN (NITROSTAT) 0.4 MG SL tablet Place 1 tablet (0.4 mg total) under the tongue every 5 (five) minutes as needed for chest pain.   No facility-administered encounter medications on file as of 12/18/2021.    Allergies (verified) Other   History: Past Medical History:  Diagnosis Date   Complication of anesthesia    "serious cognisance problems since my OR in January" (03/13/2013   Coronary artery disease    Diverticulosis    Hiatal hernia    Hx of echocardiogram    Echo 5/16:  Mild focal basal septal hypertrophy, EF 55%, mild AI, MV repair ok with trivial MR (mean 3 mmHg), mild LAE, mild RAE, PASP 35 mmHg   Hypertension    Hypothyroidism    Irritable bowel syndrome    PAF (paroxysmal atrial fibrillation) ([place] 05/03/2014   Personal history of colonic polyps    tubular adenoma   Pleural effusion, bilateral 03/12/2013  Small to moderate, L>R   Prostate cancer [facility]) 1991   se[facility]. Carlota Raspberry at [facility], observation only    S/P CABG x 2 02/28/2013   LIMA to LAD, SVG to OM1, EVH via right thigh   S/P Maze operation for atrial fibrillation 02/28/2013   Complete bilateral atrial lesion set using bipolar radiofrequency and cryothermy ablation with clipping of LA appendage   S/P mitral valve repair, maze procedure, and CABG x2 02/28/2013   Complex valvuloplasty including triangular resection of posterior leaflet, 30 mm Sorin Memo 3D ring annuloplasty   Past Surgical History:  Procedure Laterality Date   CARDIAC CATHETERIZATION  2015   CATARACT EXTRACTION,  BILATERAL Bilateral 2014   per Dr. Gershon Crane    COLONOSCOPY  2011   per Dr. Sharlett Iles, benign polyps, no repeats needed    CORONARY ARTERY BYPASS GRAFT N/A 02/28/2013   Procedure: CORONARY ARTERY BYPASS GRAFTING (CABG);  Surgeon: Rexene Alberts, MD;  Location: [place];  Service: Open Heart Surgery;  Laterality: N/A;  CABG x 2,  using left internal mammary artery and right leg saphenous vein harvested endoscopically   ESOPHAGOGASTRODUODENOSCOPY (EGD) WITH PROPOFOL N/A 01/11/2019   Procedure: ESOPHAGOGASTRODUODENOSCOPY (EGD) WITH PROPOFOL;  Surgeon: Doran Stabler, MD;  Location: [facility];  Service: Gastroenterology;  Laterality: N/A;   HEMORRHOID SURGERY     INTRAOPERATIVE TRANSESOPHAGEAL ECHOCARDIOGRAM N/A 02/28/2013   Procedure: INTRAOPERATIVE TRANSESOPHAGEAL ECHOCARDIOGRAM;  Surgeon: Rexene Alberts, MD;  Location: [place];  Service: Open Heart Surgery;  Laterality: N/A;   LEFT HEART CATHETERIZATION WITH CORONARY ANGIOGRAM N/A 02/26/2013   Procedure: LEFT HEART CATHETERIZATION WITH CORONARY ANGIOGRAM;  Surgeon: Leonie Man, MD;  Location: [facility] CATH LAB;  Service: Cardiovascular;  Laterality: N/A;   MAZE N/A 02/28/2013   Procedure: MAZE;  Surgeon: Rexene Alberts, MD;  Location: [place];  Service: Open Heart Surgery;  Laterality: N/A;   MITRAL VALVE REPAIR N/A 02/28/2013   Procedure: MITRAL VALVE REPAIR (MVR);  Surgeon: Rexene Alberts, MD;  Location: [place];  Service: Open Heart Surgery;  Laterality: N/A;   PROSTATE BIOPSY     TRANSURETHRAL RESECTION OF PROSTATE N/A 02/04/2017   Procedure: TRANSURETHRAL RESECTION OF THE PROSTATE (TURP);  Surgeon: Alexis Frock, MD;  Location: [facility];  Service: Urology;  Laterality: N/A;   Family History  Problem Relation Age of Onset   Hypertension Mother    Sudden death Mother    Stroke Mother    Heart disease Mother    Hypertension Father    Sudden death Father    Stroke Father    Heart disease Father    Heart disease Son    Diabetes Son    Lung cancer  Brother    Stomach cancer Brother    Cancer Daughter        rebdomyosarcoma   Kidney cancer Brother        mets   Diabetes Sister    Social History   Socioeconomic History   Marital status: Married    Spouse name: Mikle Bosworth   Number of children: 3   Years of education: Not on file   Highest education level: Not on file  Occupational History   Occupation: Retired  Tobacco Use   Smoking status: Never   Smokeless tobacco: Never  Vaping Use   Vaping Use: Never used  Substance and Sexual Activity   Alcohol use: No    Alcohol/week: 0.0 standard drinks of alcohol   Drug use: No   Sexual activity: Not  Currently  Other Topics Concern   Not on file  Social History Narrative   Married retired Biomedical scientist   Lives at [facility], independent living though his wife is in a wheelchair and they help each other   1 son lives in [place] a daughter lives in [place]   Never smoker no alcohol or drugs   Social Determinants of Health   Financial Resource Strain: Low Risk  (12/18/2021)   Overall Financial Resource Strain (CARDIA)    Difficulty of Paying Living Expenses: Not hard at all  Food Insecurity: No Food Insecurity (12/18/2021)   Hunger Vital Sign    Worried About Running Out of Food in the Last Year: Never true    [place] in the Last Year: Never true  Transportation Needs: No Transportation Needs (11/04/2020)   PRAPARE - Hydrologist (Medical): No    Lack of Transportation (Non-Medical): No  Physical Activity: Inactive (12/18/2021)   Exercise Vital Sign    Days of Exercise per Week: 0 days    Minutes of Exercise per Session: 0 min  Stress: No Stress Concern Present (12/18/2021)   [place]    Feeling of Stress : Not at all  Social Connections: Moderately Integrated (12/18/2021)   Social Connection and Isolation Panel [NHANES]     Frequency of Communication with Friends and Family: More than three times a week    Frequency of Social Gatherings with Friends and Family: More than three times a week    Attends Religious Services: More than 4 times per year    Active Member of [employer] or Organizations: No    Attends Music therapist: Never    Marital Status: Married    Tobacco Counseling Counseling given: Not Answered   Clinical Intake:  Pre-visit preparation completed: Yes  Pain : No/denies pain     BMI - recorded: 21.71 Nutritional Risks: None Diabetes: No  How often do you need to have someone help you when you read instructions, pamphlets, or other written materials from your doctor or pharmacy?: 4 - Often What is the last grade level you completed in school?: some college  Diabetic?No  Interpreter Needed?: No      Activities of Daily Living    12/18/2021   10:16 AM  In your present state of health, do you have any difficulty performing the following activities:  Hearing? 0  Vision? 0  Difficulty concentrating or making decisions? 1  Walking or climbing stairs? 1  Dressing or bathing? 1  Doing errands, shopping? 1  Preparing Food and eating ? Y  Using the Toilet? Y  In the past six months, have you accidently leaked urine? Y  Do you have problems with loss of bowel control? N  Managing your Medications? Y  Managing your Finances? Y  Housekeeping or managing your Housekeeping? Y    Patient Care Team: Yvonna Alanis, NP as PCP - General (Adult Health Nurse Practitioner) [name], [facility] as Pharmacist (Pharmacist) Virgie Dad, MD as Consulting Physician (Internal Medicine)  Indicate any recent Medical Services you may have received from other than [facility] providers in the past year (date may be approximate).     Assessment:   This is a routine wellness examination for [employer].  Hearing/Vision screen No results found.  Dietary issues and exercise activities  discussed: Current Exercise Habits: The patient does not participate in  regular exercise at present, Exercise limited by: cardiac condition(s);neurologic condition(s)   Goals Addressed             This Visit's Progress    Maintain Mobility and Function   On track    Evidence-based guidance:  Acknowledge and validate impact of pain, loss of strength and potential disfigurement (hand osteoarthritis) on mental health and daily life, such as social isolation, anxiety, depression, impaired sexual relationship and   injury from falls.  Anticipate referral to physical or occupational therapy for assessment, therapeutic exercise and recommendation for adaptive equipment or assistive devices; encourage participation.  Assess impact on ability to perform activities of daily living, as well as engage in sports and leisure events or requirements of work or school.  Provide anticipatory guidance and reassurance about the benefit of exercise to maintain function; acknowledge and normalize fear that exercise may worsen symptoms.  Encourage regular exercise, at least 10 minutes at a time for 45 minutes per week; consider yoga, water exercise and proprioceptive exercises; encourage use of wearable activity tracker to increase motivation and adherence.  Encourage maintenance or resumption of daily activities, including employment, as pain allows and with minimal exposure to trauma.  Assist patient to advocate for adaptations to the work environment.  Consider level of pain and function, gender, age, lifestyle, patient preference, quality of life, readiness and ?ocapacity to benefit? when recommending patients for orthopaedic surgery consultation.  Explore strategies, such as changes to medication regimen or activity that enables patient to anticipate and manage flare-ups that increase deconditioning and disability.  Explore patient preferences; encourage exposure to a broader range of activities that have  been avoided for fear of experiencing pain.  Identify barriers to participation in therapy or exercise, such as pain with activity, anticipated or imagined pain.  Monitor postoperative joint replacement or any preexisting joint replacement for ongoing pain and loss of function; provide social support and encouragement throughout recovery.   Notes:        Depression Screen    12/18/2021   10:13 AM 12/10/2021   11:55 AM 12/02/2021    2:36 PM 01/28/2021    1:23 PM 09/10/2020   11:11 AM 01/03/2020   10:58 AM 01/22/2019   10:52 AM  PHQ 2/9 Scores  PHQ - 2 Score 0 0 0 0 0 0 0  PHQ- 9 Score  3 3      Exception Documentation Patient refusal          Fall Risk    12/18/2021   10:15 AM 12/10/2021   11:45 AM 12/02/2021    2:35 PM 01/28/2021    1:23 PM 09/10/2020   11:13 AM  Fall Risk   Falls in the past year? '1 1 1 1 '$ 0  Number falls in past yr: '1 1 1 1 '$ 0  Injury with Fall? 0 1 0 0 0  Risk for fall due to : History of fall(s);Impaired balance/gait;Impaired mobility History of fall(s) History of fall(s) Impaired mobility;Impaired balance/gait Impaired balance/gait;Impaired mobility  Follow up Falls evaluation completed;Education provided;Falls prevention discussed Falls evaluation completed Falls evaluation completed;Falls prevention discussed Falls evaluation completed Falls prevention discussed    FALL RISK PREVENTION PERTAINING TO THE HOME:  Any stairs in or around the home? No  If so, are there any without handrails? No  Home free of loose throw rugs in walkways, pet beds, electrical cords, etc? Yes  Adequate lighting in your home to reduce risk of falls? Yes   ASSISTIVE DEVICES UTILIZED TO PREVENT  FALLS:  Life alert? No  Use of a cane, walker or w/c? Yes  Grab bars in the bathroom? Yes  Shower chair or bench in shower? Yes  Elevated toilet seat or a handicapped toilet? Yes   TIMED UP AND GO:  Was the test performed? No .  Length of time to ambulate 10 feet: N/A sec.    Gait slow and steady with assistive device  Cognitive Function:    12/18/2021   10:17 AM  MMSE - Mini Mental State Exam  Orientation to time 4  Orientation to Place 5  Registration 3  Attention/ Calculation 4  Recall 1  Language- name 2 objects 2  Language- repeat 1  Language- follow 3 step command 3  Language- read & follow direction 1  Write a sentence 1  Copy design 1  Total score 26        09/10/2020   11:17 AM  6CIT Screen  What Year? 0 points  What month? 0 points  What time? 0 points  Count back from 20 0 points  Months in reverse 0 points  Repeat phrase 0 points  Total Score 0 points    Immunizations Immunization History  Administered Date(s) Administered   Fluad Quad(high Dose 65+) 11/21/2018, 12/25/2019, 01/02/2021   Influenza Split 11/01/2011   Influenza Whole 12/15/2006, 12/06/2007, 12/31/2009   Influenza, High Dose Seasonal PF 11/11/2014, 11/12/2015, 11/15/2016, 11/18/2017, 11/21/2018, 01/02/2021   Influenza,inj,Quad PF,6+ Mos 11/01/2012, 11/09/2013   Influenza-Unspecified 11/01/2011, 11/01/2012, 11/09/2013   Moderna SARS-COV2 Booster Vaccination 01/07/2020   Moderna Sars-Covid-2 Vaccination 02/26/2019, 03/26/2019   Pneumococcal Conjugate-13 11/09/2013   Pneumococcal Polysaccharide-23 02/23/2004   Td 02/22/2002   Tdap 01/22/2014    TDAP status: Up to date  Flu Vaccine status: Up to date  Pneumococcal vaccine status: Up to date  Covid-19 vaccine status: Completed vaccines  Qualifies for Shingles Vaccine? Yes   Zostavax completed No   Shingrix Completed?: No.    Education has been provided regarding the importance of this vaccine. Patient has been advised to call insurance company to determine out of pocket expense if they have not yet received this vaccine. Advised may also receive vaccine at local pharmacy or Health Dept. Verbalized acceptance and understanding.  Screening Tests Health Maintenance  Topic Date Due   Zoster Vaccines-  Shingrix (1 of 2) Never done   COVID-19 Vaccine (3 - Moderna risk series) 02/04/2020   INFLUENZA VACCINE  09/22/2021   Medicare Annual Wellness (AWV)  12/19/2022   TETANUS/TDAP  01/23/2024   Pneumonia Vaccine 33+ Years old  Completed   HPV VACCINES  Aged Out   COLONOSCOPY (Pts 45-41yr Insurance coverage will need to be confirmed)  Discontinued    Health Maintenance  Health Maintenance Due  Topic Date Due   Zoster Vaccines- Shingrix (1 of 2) Never done   COVID-19 Vaccine (3 - Moderna risk series) 02/04/2020   INFLUENZA VACCINE  09/22/2021    Colorectal cancer screening: No longer required.   Lung Cancer Screening: (Low Dose CT Chest recommended if Age 86-80years, 30 pack-year currently smoking OR have quit w/in 15years.) does not qualify.   Lung Cancer Screening Referral: No  Additional Screening:  Hepatitis C Screening: does not qualify; Completed   Vision Screening: Recommended annual ophthalmology exams for early detection of glaucoma and other disorders of the eye. Is the patient up to date with their annual eye exam?  Yes  Who is the provider or what is the name of the office in which  the patient attends annual eye exams? Dr.Shapiro If pt is not established with a provider, would they like to be referred to a provider to establish care? No .   Dental Screening: Recommended annual dental exams for proper oral hygiene  Community Resource Referral / Chronic Care Management: CRR required this visit?  No   CCM required this visit?  No      Plan:     I have personally reviewed and noted the following in the patient's chart:   Medical and social history Use of alcohol, tobacco or illicit drugs  Current medications and supplements including opioid prescriptions. Patient is not currently taking opioid prescriptions. Functional ability and status Nutritional status Physical activity Advanced directives List of other physicians Hospitalizations, surgeries, and ER  visits in previous 12 months Vitals Screenings to include cognitive, depression, and falls Referrals and appointments  In addition, I have reviewed and discussed with patient certain preventive protocols, quality metrics, and best practice recommendations. A written personalized care plan for preventive services as well as general preventive health recommendations were provided to patient.     Yvonna Alanis, NP   12/18/2021   Nurse Notes: Wife present during encounter, refused shingrix

## 2021-12-18 NOTE — Patient Instructions (Signed)
[name] , Thank you for taking time to come for your Medicare Wellness Visit. I appreciate your ongoing commitment to your health goals. Please review the following plan we discussed and let me know if I can assist you in the future.   These are the goals we discussed:  Goals      Maintain Mobility and Function     Evidence-based guidance:  Acknowledge and validate impact of pain, loss of strength and potential disfigurement (hand osteoarthritis) on mental health and daily life, such as social isolation, anxiety, depression, impaired sexual relationship and   injury from falls.  Anticipate referral to physical or occupational therapy for assessment, therapeutic exercise and recommendation for adaptive equipment or assistive devices; encourage participation.  Assess impact on ability to perform activities of daily living, as well as engage in sports and leisure events or requirements of work or school.  Provide anticipatory guidance and reassurance about the benefit of exercise to maintain function; acknowledge and normalize fear that exercise may worsen symptoms.  Encourage regular exercise, at least 10 minutes at a time for 45 minutes per week; consider yoga, water exercise and proprioceptive exercises; encourage use of wearable activity tracker to increase motivation and adherence.  Encourage maintenance or resumption of daily activities, including employment, as pain allows and with minimal exposure to trauma.  Assist patient to advocate for adaptations to the work environment.  Consider level of pain and function, gender, age, lifestyle, patient preference, quality of life, readiness and ?ocapacity to benefit? when recommending patients for orthopaedic surgery consultation.  Explore strategies, such as changes to medication regimen or activity that enables patient to anticipate and manage flare-ups that increase deconditioning and disability.  Explore patient preferences; encourage  exposure to a broader range of activities that have been avoided for fear of experiencing pain.  Identify barriers to participation in therapy or exercise, such as pain with activity, anticipated or imagined pain.  Monitor postoperative joint replacement or any preexisting joint replacement for ongoing pain and loss of function; provide social support and encouragement throughout recovery.   Notes:      none at this time     Track and Manage My Blood Pressure-Hypertension     Timeframe:  Short-Term Goal Priority:  Medium Start Date:                             Expected End Date:                       Follow Up Date 02/20/21    - check blood pressure weekly - choose a place to take my blood pressure (home, clinic or office, retail store) - write blood pressure results in a log or diary    Why is this important?   You won't feel high blood pressure, but it can still hurt your blood vessels.  High blood pressure can cause heart or kidney problems. It can also cause a stroke.  Making lifestyle changes like losing a little weight or eating less salt will help.  Checking your blood pressure at home and at different times of the day can help to control blood pressure.  If the doctor prescribes medicine remember to take it the way the doctor ordered.  Call the office if you cannot afford the medicine or if there are questions about it.     Notes:         This is  a list of the screening recommended for you and due dates:  Health Maintenance  Topic Date Due   COVID-19 Vaccine (3 - Moderna risk series) 02/04/2020   Zoster (Shingles) Vaccine (1 of 2) 03/20/2022*   Medicare Annual Wellness Visit  12/19/2022   Tetanus Vaccine  01/23/2024   Pneumonia Vaccine  Completed   Flu Shot  Completed   HPV Vaccine  Aged Out   Colon Cancer Screening  Discontinued  *Topic was postponed. The date shown is not the original due date.

## 2021-12-21 DIAGNOSIS — E785 Hyperlipidemia, unspecified: Secondary | ICD-10-CM | POA: Diagnosis not present

## 2021-12-21 DIAGNOSIS — M6281 Muscle weakness (generalized): Secondary | ICD-10-CM | POA: Diagnosis not present

## 2021-12-21 DIAGNOSIS — R41841 Cognitive communication deficit: Secondary | ICD-10-CM | POA: Diagnosis not present

## 2021-12-21 DIAGNOSIS — R262 Difficulty in walking, not elsewhere classified: Secondary | ICD-10-CM | POA: Diagnosis not present

## 2021-12-22 DIAGNOSIS — E785 Hyperlipidemia, unspecified: Secondary | ICD-10-CM | POA: Diagnosis not present

## 2021-12-22 DIAGNOSIS — M6281 Muscle weakness (generalized): Secondary | ICD-10-CM | POA: Diagnosis not present

## 2021-12-22 DIAGNOSIS — R41841 Cognitive communication deficit: Secondary | ICD-10-CM | POA: Diagnosis not present

## 2021-12-22 DIAGNOSIS — R262 Difficulty in walking, not elsewhere classified: Secondary | ICD-10-CM | POA: Diagnosis not present

## 2021-12-24 DIAGNOSIS — E785 Hyperlipidemia, unspecified: Secondary | ICD-10-CM | POA: Diagnosis not present

## 2021-12-24 DIAGNOSIS — M6281 Muscle weakness (generalized): Secondary | ICD-10-CM | POA: Diagnosis not present

## 2021-12-24 DIAGNOSIS — R262 Difficulty in walking, not elsewhere classified: Secondary | ICD-10-CM | POA: Diagnosis not present

## 2021-12-29 DIAGNOSIS — E785 Hyperlipidemia, unspecified: Secondary | ICD-10-CM | POA: Diagnosis not present

## 2021-12-29 DIAGNOSIS — M6281 Muscle weakness (generalized): Secondary | ICD-10-CM | POA: Diagnosis not present

## 2021-12-29 DIAGNOSIS — R262 Difficulty in walking, not elsewhere classified: Secondary | ICD-10-CM | POA: Diagnosis not present

## 2021-12-31 DIAGNOSIS — E785 Hyperlipidemia, unspecified: Secondary | ICD-10-CM | POA: Diagnosis not present

## 2021-12-31 DIAGNOSIS — R262 Difficulty in walking, not elsewhere classified: Secondary | ICD-10-CM | POA: Diagnosis not present

## 2021-12-31 DIAGNOSIS — M6281 Muscle weakness (generalized): Secondary | ICD-10-CM | POA: Diagnosis not present

## 2022-01-06 ENCOUNTER — Encounter: Payer: Self-pay | Admitting: Orthopedic Surgery

## 2022-01-06 ENCOUNTER — Non-Acute Institutional Stay (SKILLED_NURSING_FACILITY): Payer: Medicare HMO | Admitting: Orthopedic Surgery

## 2022-01-06 DIAGNOSIS — R2681 Unsteadiness on feet: Secondary | ICD-10-CM | POA: Diagnosis not present

## 2022-01-06 DIAGNOSIS — I5032 Chronic diastolic (congestive) heart failure: Secondary | ICD-10-CM

## 2022-01-06 DIAGNOSIS — C61 Malignant neoplasm of prostate: Secondary | ICD-10-CM | POA: Diagnosis not present

## 2022-01-06 DIAGNOSIS — E039 Hypothyroidism, unspecified: Secondary | ICD-10-CM | POA: Diagnosis not present

## 2022-01-06 DIAGNOSIS — I10 Essential (primary) hypertension: Secondary | ICD-10-CM

## 2022-01-06 DIAGNOSIS — N368 Other specified disorders of urethra: Secondary | ICD-10-CM | POA: Diagnosis not present

## 2022-01-06 DIAGNOSIS — D509 Iron deficiency anemia, unspecified: Secondary | ICD-10-CM | POA: Diagnosis not present

## 2022-01-06 DIAGNOSIS — F01B0 Vascular dementia, moderate, without behavioral disturbance, psychotic disturbance, mood disturbance, and anxiety: Secondary | ICD-10-CM

## 2022-01-06 DIAGNOSIS — K219 Gastro-esophageal reflux disease without esophagitis: Secondary | ICD-10-CM | POA: Diagnosis not present

## 2022-01-06 DIAGNOSIS — I2510 Atherosclerotic heart disease of native coronary artery without angina pectoris: Secondary | ICD-10-CM

## 2022-01-06 DIAGNOSIS — R69 Illness, unspecified: Secondary | ICD-10-CM | POA: Diagnosis not present

## 2022-01-06 NOTE — Progress Notes (Signed)
Location:  [place] Room Number: 28/A Place of Service:  SNF (31) Provider:  Yvonna Alanis, NP   Yvonna Alanis, NP  Patient Care Team: Yvonna Alanis, NP as PCP - General (Adult Health Nurse Practitioner) [name], [facility] as Pharmacist (Pharmacist) Virgie Dad, MD as Consulting Physician (Internal Medicine)  Extended Emergency Contact Information Primary Emergency Contact: Piggee,Geraldine Address: [address]          [place] [address] Johnnette Litter of [place] Phone: [phone] Relation: Spouse Secondary Emergency Contact: Tennis Must Mobile Phone: [phone] Relation: Daughter  Code Status:  DNR Goals of care: Advanced Directive information    12/10/2021   11:46 AM  Advanced Directives  Does Patient Have a Medical Advance Directive? Yes  Type of Paramedic of [place];Living will;Out of facility DNR (pink MOST or yellow form)  Does patient want to make changes to medical advance directive? No - Patient declined  Copy of [place] in Chart? Yes - validated most recent copy scanned in chart (See row information)     Chief Complaint  Patient presents with   Medical Management of Chronic Issues    HPI:  Pt is a 88 y.o. male seen today for medical management of chronic diseases.    PMH: CAD with CABG x 2, HTN, HLD, CHF, mitral valve repair 2015, prostate cancer, TURP 2018, left ureteroscopy with left stent placement 10/2021, Diverticulosis, GERD, GI bleed, hypothyroidism, osteopenia, and falls.    Prostate cancer- followed by Dr. Burnell Blanks, diagnosed 2012, TURP 2019, cancer now affecting left urethra- stent placed 10/2021, Cipro stopped 12/12/2021, remains on Lupron injections Q3 months Urethral obstruction- see above Unstable gait- ambulates with wheelchair, no recent falls per wife CHF- 2019 LVEF 55-60%, denies sob/ some ankle edema, not on diuretics HTN- BUN/creat 21/1.08  12/11/2021, remains on metoprolol CAD- LDL 55 12/11/2021, unable to tolerate aspirin due to past GI bleed, remains on Lipitor GERD- H/o GI bleed in past, Hgb 12.1 12/11/2021, not on PPI Hypothyroidism- TSH 3.72 12/11/2021 remains on levothyroxine Vascular dementia/cognitive decline- 02/2021 MRI brain indicated small chronic cortical infarcts within left frontal/parietal lobes, moderate cerebral white matter chronic small vessel ischemic disease also noted, adjusted well to SNF, no behaviors, not on medication Iron deficiency anemia- Hgb 12.1 12/11/2021, remains on ferrous sulfate/ B12  Recent blood pressures:  11/14- 124/61  11/12- 124/70  11/07- 163/82  Recent weights:  11/08- 146.8 lbs  10/18- 149.3 lbs    Past Medical History:  Diagnosis Date   Complication of anesthesia    "serious cognisance problems since my OR in January" (03/13/2013   Coronary artery disease    Diverticulosis    Hiatal hernia    Hx of echocardiogram    Echo 5/16:  Mild focal basal septal hypertrophy, EF 55%, mild AI, MV repair ok with trivial MR (mean 3 mmHg), mild LAE, mild RAE, PASP 35 mmHg   Hypertension    Hypothyroidism    Irritable bowel syndrome    PAF (paroxysmal atrial fibrillation) ([place]) 05/03/2014   Personal history of colonic polyps    tubular adenoma   Pleural effusion, bilateral 03/12/2013   Small to moderate, L>R   Prostate cancer [facility]) 1991   sees Dr. Carlota Raspberry at [facility], observation only    S/P CABG x 2 02/28/2013   LIMA to LAD, SVG to OM1, EVH via right thigh   S/P Maze operation for atrial fibrillation 02/28/2013   Complete bilateral  atrial lesion set using bipolar radiofrequency and cryothermy ablation with clipping of LA appendage   S/P mitral valve repair, maze procedure, and CABG x2 02/28/2013   Complex valvuloplasty including triangular resection of posterior leaflet, 30 mm Sorin Memo 3D ring annuloplasty   Past Surgical History:  Procedure Laterality Date   CARDIAC CATHETERIZATION   2015   CATARACT EXTRACTION, BILATERAL Bilateral 2014   per Dr. Gershon Crane    COLONOSCOPY  2011   per Dr. Sharlett Iles, benign polyps, no repeats needed    CORONARY ARTERY BYPASS GRAFT N/A 02/28/2013   Procedure: CORONARY ARTERY BYPASS GRAFTING (CABG);  Surgeon: Rexene Alberts, MD;  Location: [place];  Service: Open Heart Surgery;  Laterality: N/A;  CABG x 2,  using left internal mammary artery and right leg saphenous vein harvested endoscopically   ESOPHAGOGASTRODUODENOSCOPY (EGD) WITH PROPOFOL N/A 01/11/2019   Procedure: ESOPHAGOGASTRODUODENOSCOPY (EGD) WITH PROPOFOL;  Surgeon: Doran Stabler, MD;  [facility];  Service: Gastroenterology;  Laterality: N/A;   HEMORRHOID SURGERY     INTRAOPERATIVE TRANSESOPHAGEAL ECHOCARDIOGRAM N/A 02/28/2013   Procedure: INTRAOPERATIVE TRANSESOPHAGEAL ECHOCARDIOGRAM;  Surgeon: Rexene Alberts, MD;  Location: [place];  Service: Open Heart Surgery;  Laterality: N/A;   LEFT HEART CATHETERIZATION WITH CORONARY ANGIOGRAM N/A 02/26/2013   Procedure: LEFT HEART CATHETERIZATION WITH CORONARY ANGIOGRAM;  Surgeon: Leonie Man, MD;  Location: [facility] CATH LAB;  Service: Cardiovascular;  Laterality: N/A;   MAZE N/A 02/28/2013   Procedure: MAZE;  Surgeon: Rexene Alberts, MD;  Location: [place];  Service: Open Heart Surgery;  Laterality: N/A;   MITRAL VALVE REPAIR N/A 02/28/2013   Procedure: MITRAL VALVE REPAIR (MVR);  Surgeon: Rexene Alberts, MD;  Location: [place];  Service: Open Heart Surgery;  Laterality: N/A;   PROSTATE BIOPSY     TRANSURETHRAL RESECTION OF PROSTATE N/A 02/04/2017   Procedure: TRANSURETHRAL RESECTION OF THE PROSTATE (TURP);  Surgeon: Alexis Frock, MD;  [facility];  Service: Urology;  Laterality: N/A;    Allergies  Allergen Reactions   Other Other (See Comments)    ANESTHESIA.... 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 ANESTHESIA.... 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 Occurred post op CABG 2015, required  readmission and rehab    Outpatient Encounter Medications as of 01/06/2022  Medication Sig   atorvastatin (LIPITOR) 10 MG tablet TAKE 1 TABLET EVERY DAY AT 6PM   Cholecalciferol (VITAMIN D3 PO) Take 2,000 Units by mouth daily.   Cyanocobalamin 2500 MCG SUBL Place 1 tablet under the tongue daily.   ferrous sulfate 325 (65 FE) MG EC tablet TAKE 1 TABLET BY MOUTH EVERY DAY   fish oil-omega-3 fatty acids 1000 MG capsule Take 1 g by mouth every morning.   Leuprolide Acetate, 6 Month, (LUPRON) 45 MG injection Inject 45 mg into the muscle every 3 (three) months.  (Patient not taking: Reported on 12/10/2021)   levothyroxine (SYNTHROID) 50 MCG tablet TAKE 1 TABLET BY MOUTH IN THE MORNING   metoprolol tartrate (LOPRESSOR) 25 MG tablet TAKE ONE TABLET BY MOUTH TWICE A DAY   nitroGLYCERIN (NITROSTAT) 0.4 MG SL tablet Place 1 tablet (0.4 mg total) under the tongue every 5 (five) minutes as needed for chest pain.   No facility-administered encounter medications on file as of 01/06/2022.    Review of Systems  Constitutional:  Negative for activity change, appetite change, fatigue and fever.  HENT:  Negative for congestion and trouble swallowing.   Eyes:  Negative for visual  disturbance.  Respiratory:  Negative for cough, shortness of breath and wheezing.   Cardiovascular:  Negative for chest pain and leg swelling.  Gastrointestinal:  Negative for abdominal distention, abdominal pain, constipation, diarrhea, nausea and vomiting.  Genitourinary:  Positive for frequency. Negative for dysuria and hematuria.  Musculoskeletal:  Positive for gait problem.  Skin:  Negative for wound.  Neurological:  Positive for weakness. Negative for dizziness and headaches.  Psychiatric/Behavioral:  Positive for confusion. Negative for dysphoric mood and sleep disturbance. The patient is not nervous/anxious.     Immunization History  Administered Date(s) Administered   Fluad Quad(high Dose 65+) 11/21/2018, 12/25/2019,  01/02/2021, 12/16/2021   Influenza Split 11/01/2011   Influenza Whole 12/15/2006, 12/06/2007, 12/31/2009   Influenza, High Dose Seasonal PF 11/11/2014, 11/12/2015, 11/15/2016, 11/18/2017, 11/21/2018, 01/02/2021   Influenza,inj,Quad PF,6+ Mos 11/01/2012, 11/09/2013   Influenza-Unspecified 11/01/2011, 11/01/2012, 11/09/2013   Moderna SARS-COV2 Booster Vaccination 01/07/2020   Moderna Sars-Covid-2 Vaccination 02/26/2019, 03/26/2019   PFIZER(Purple Top)SARS-COV-2 Vaccination 12/29/2021   Pneumococcal Conjugate-13 11/09/2013   Pneumococcal Polysaccharide-23 02/23/2004   Td 02/22/2002   Tdap 01/22/2014   Pertinent  Health Maintenance Due  Topic Date Due   INFLUENZA VACCINE  Completed   COLONOSCOPY (Pts 45-78yr Insurance coverage will need to be confirmed)  Discontinued      09/10/2020   11:13 AM 01/28/2021    1:23 PM 12/02/2021    2:35 PM 12/10/2021   11:45 AM 12/18/2021   10:15 AM  [place] the past year? 0 '1 1 1 1  '$ Was there an injury with Fall? 0 0 0 1 0  Fall Risk Category Calculator 0 '2 2 3 2  '$ Fall Risk Category Low Moderate Moderate High Moderate  Patient Fall Risk Level  Moderate fall risk Moderate fall risk Moderate fall risk Moderate fall risk  Patient at Risk for Falls Due to Impaired balance/gait;Impaired mobility Impaired mobility;Impaired balance/gait History of fall(s) History of fall(s) History of fall(s);Impaired balance/gait;Impaired mobility  Fall risk Follow up Falls prevention discussed Falls evaluation completed Falls evaluation completed;Falls prevention discussed Falls evaluation completed Falls evaluation completed;Education provided;Falls prevention discussed   Functional Status Survey:    Vitals:   01/06/22 1402  BP: 124/61  Pulse: 67  Resp: 18  Temp: 97.8 F (36.6 C)  SpO2: 98%  Weight: 146 lb 12.8 oz (66.6 kg)  Height: '5\' 9"'$  (1.753 m)   Body mass index is 21.68 kg/m. Physical Exam Vitals reviewed.  Constitutional:      General: He  is not in acute distress. HENT:     Head: Normocephalic.     Right Ear: There is no impacted cerumen.     Left Ear: There is no impacted cerumen.     Nose: Nose normal.     Mouth/Throat:     Mouth: Mucous membranes are moist.  Eyes:     General:        Right eye: No discharge.        Left eye: No discharge.  Cardiovascular:     Rate and Rhythm: Normal rate and regular rhythm.     Pulses: Normal pulses.     Heart sounds: Normal heart sounds.  Pulmonary:     Effort: Pulmonary effort is normal. No respiratory distress.     Breath sounds: Normal breath sounds. No wheezing.  Abdominal:     General: Bowel sounds are normal. There is no distension.     Palpations: Abdomen is soft.     Tenderness: There is no  abdominal tenderness.  Musculoskeletal:     Cervical back: Neck supple.     Right lower leg: No edema.     Left lower leg: No edema.  Skin:    General: Skin is warm and dry.     Capillary Refill: Capillary refill takes less than 2 seconds.  Neurological:     General: No focal deficit present.     Mental Status: He is alert. Mental status is at baseline.     Motor: Weakness present.     Gait: Gait abnormal.     Comments: wheelchair  Psychiatric:        Mood and Affect: Mood normal.        Behavior: Behavior normal.     Comments: Very pleasant, follows commands, alert to self/person/place     Labs reviewed: Recent Labs    01/28/21 1454 02/03/21 0950 07/28/21 1025  NA 141 141 141  K 4.5 4.6 4.0  CL 106 108 109  CO2 '27 27 29  '$ GLUCOSE 82 89 84  BUN 20 23 24*  CREATININE 1.06 1.08 1.16  CALCIUM 9.4 8.9 9.1   Recent Labs    02/03/21 0950 07/28/21 1025  AST 19 15  ALT 21 11  ALKPHOS 59 49  BILITOT 0.7 0.5  PROT 6.3* 6.1*  ALBUMIN 3.2* 3.4*   Recent Labs    02/03/21 0950 07/28/21 1025  WBC 4.8 5.0  NEUTROABS 3.5 3.9  HGB 12.0* 11.3*  HCT 35.5* 34.3*  MCV 92.9 93.5  PLT 150 140*   Lab Results  Component Value Date   TSH 2.42 01/28/2021   Lab  Results  Component Value Date   HGBA1C 5.6 07/01/2016   Lab Results  Component Value Date   CHOL 121 01/28/2021   HDL 51.10 01/28/2021   LDLCALC 55 01/28/2021   LDLDIRECT 159.3 05/17/2007   TRIG 76.0 01/28/2021   CHOLHDL 2 01/28/2021    Significant Diagnostic Results in last 30 days:  No results found.  Assessment/Plan 1. ADENOCARCINOMA, PROSTATE, GLEASON GRADE 3 - followed by Dr. Burnell Blanks - left urethral stent 10/28/2021- completed Cipro 10/21 - cont Lupron injections every 3 months  2. Urethral obstruction - see above  3. Unstable gait - no recent falls - cont skilled nursing  4. Chronic diastolic CHF (congestive heart failure) ([place]) - 2019 LVEF 55-60% - compensated  5. Essential hypertension - controlled with metoprolol  6. Coronary artery disease involving native coronary artery of native heart without angina pectoris - no aspirin due to past GI bleed - cont Lipitor  7. Gastroesophageal reflux disease without esophagitis - hgb stable - not on medication  8. Acquired hypothyroidism - TSH stable - cont hypothyroidism  9. Moderate vascular dementia without behavioral disturbance, psychotic disturbance, mood disturbance, or anxiety (HCC) - no behaviors - not on medication  10. Iron deficiency anemia, unspecified iron deficiency anemia type - hgb stable - cont ferrous sulfate/B12    Family/ staff Communication: plan discussed with patient and nurse  Labs/tests ordered:  none

## 2022-01-11 DIAGNOSIS — C61 Malignant neoplasm of prostate: Secondary | ICD-10-CM | POA: Diagnosis not present

## 2022-01-17 DIAGNOSIS — N390 Urinary tract infection, site not specified: Secondary | ICD-10-CM | POA: Diagnosis not present

## 2022-01-25 ENCOUNTER — Encounter: Payer: Self-pay | Admitting: Orthopedic Surgery

## 2022-01-25 ENCOUNTER — Non-Acute Institutional Stay (SKILLED_NURSING_FACILITY): Payer: Medicare HMO | Admitting: Orthopedic Surgery

## 2022-01-25 DIAGNOSIS — M25531 Pain in right wrist: Secondary | ICD-10-CM

## 2022-01-25 DIAGNOSIS — R69 Illness, unspecified: Secondary | ICD-10-CM | POA: Diagnosis not present

## 2022-01-25 DIAGNOSIS — F01B0 Vascular dementia, moderate, without behavioral disturbance, psychotic disturbance, mood disturbance, and anxiety: Secondary | ICD-10-CM | POA: Diagnosis not present

## 2022-01-25 MED ORDER — ACETAMINOPHEN 500 MG PO TABS: 1000.0000 mg | ORAL_TABLET | Freq: Two times a day (BID) | ORAL | 0 refills | Status: AC

## 2022-01-25 MED ORDER — ACETAMINOPHEN 500 MG PO TABS: 1000.0000 mg | ORAL_TABLET | Freq: Two times a day (BID) | ORAL | 0 refills | Status: DC | PRN

## 2022-01-25 NOTE — Progress Notes (Signed)
Location:  [place] Room Number: 28/A Place of Service:  SNF (31) Provider:  Yvonna Alanis, NP  Patient Care Team: Yvonna Alanis, NP as PCP - General (Adult Health Nurse Practitioner) [name], [facility] as Pharmacist (Pharmacist) Virgie Dad, MD as Consulting Physician (Internal Medicine)  Extended Emergency Contact Information Primary Emergency Contact: Rockwood,Geraldine Address: [address]          [place] [address] Johnnette Litter of [place] Phone: [phone] Relation: Spouse Secondary Emergency Contact: Tennis Must Mobile Phone: [phone] Relation: Daughter  Code Status:  DNR Goals of care: Advanced Directive information    01/25/2022    1:41 PM  Advanced Directives  Does Patient Have a Medical Advance Directive? Yes  Type of Paramedic of [place];Living will;Out of facility DNR (pink MOST or yellow form)  Does patient want to make changes to medical advance directive? No - Patient declined  Copy of [place] in Chart? Yes - validated most recent copy scanned in chart (See row information)     Chief Complaint  Patient presents with   Acute Visit    Acute Visit for arm pain with provider on site at [facility]    HPI:  Pt is a 88 y.o. male seen today for an acute visit for right wrist pain.   Wife present during encounter.   PMH: CAD with CABG x 2, HTN, HLD, CHF, mitral valve repair 2015, prostate cancer, TURP 2018, left ureteroscopy with left stent placement 10/2021, Diverticulosis, GERD, GI bleed, hypothyroidism, osteopenia, and falls.   12/03 he had increased pain/swelling and warmth to right wrist. He was given tylenol prn for pain and was effective. He denies recent injury to RUE. Today, he reports pain has improved. He is able to eat without difficulty. Right wrist pain rated 3/10, tender, increased with movement- not constant. Treatment option discussed with  patient. He is not interested advanced workup or adding medications. Afebrile. Vitals stable.     Past Surgical History:  Procedure Laterality Date   CARDIAC CATHETERIZATION  2015   CATARACT EXTRACTION, BILATERAL Bilateral 2014   per Dr. Gershon Crane    COLONOSCOPY  2011   per Dr. Sharlett Iles, benign polyps, no repeats needed    CORONARY ARTERY BYPASS GRAFT N/A 02/28/2013   Procedure: CORONARY ARTERY BYPASS GRAFTING (CABG);  Surgeon: Rexene Alberts, MD;  Location: [place];  Service: Open Heart Surgery;  Laterality: N/A;  CABG x 2,  using left internal mammary artery and right leg saphenous vein harvested endoscopically   ESOPHAGOGASTRODUODENOSCOPY (EGD) WITH PROPOFOL N/A 01/11/2019   Procedure: ESOPHAGOGASTRODUODENOSCOPY (EGD) WITH PROPOFOL;  Surgeon: Doran Stabler, MD;  [facility];  Service: Gastroenterology;  Laterality: N/A;   HEMORRHOID SURGERY     INTRAOPERATIVE TRANSESOPHAGEAL ECHOCARDIOGRAM N/A 02/28/2013   Procedure: INTRAOPERATIVE TRANSESOPHAGEAL ECHOCARDIOGRAM;  Surgeon: Rexene Alberts, MD;  Location: [place];  Service: Open Heart Surgery;  Laterality: N/A;   LEFT HEART CATHETERIZATION WITH CORONARY ANGIOGRAM N/A 02/26/2013   Procedure: LEFT HEART CATHETERIZATION WITH CORONARY ANGIOGRAM;  Surgeon: Leonie Man, MD;  Location: [facility] CATH LAB;  Service: Cardiovascular;  Laterality: N/A;   MAZE N/A 02/28/2013   Procedure: MAZE;  Surgeon: Rexene Alberts, MD;  Location: [place];  Service: Open Heart Surgery;  Laterality: N/A;   MITRAL VALVE REPAIR N/A 02/28/2013   Procedure: MITRAL VALVE REPAIR (MVR);  Surgeon: Rexene Alberts, MD;  Location: [place];  Service:  Open Heart Surgery;  Laterality: N/A;   PROSTATE BIOPSY     TRANSURETHRAL RESECTION OF PROSTATE N/A 02/04/2017   Procedure: TRANSURETHRAL RESECTION OF THE PROSTATE (TURP);  Surgeon: Alexis Frock, MD;  [facility];  Service: Urology;  Laterality: N/A;    Allergies  Allergen Reactions   Other Other (See Comments)     ANESTHESIA.... 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 ANESTHESIA.... 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 Occurred post op CABG 2015, required readmission and rehab    Outpatient Encounter Medications as of 01/25/2022  Medication Sig   atorvastatin (LIPITOR) 10 MG tablet TAKE 1 TABLET EVERY DAY AT 6PM   Cholecalciferol (VITAMIN D3 PO) Take 2,000 Units by mouth daily.   Cyanocobalamin 2500 MCG SUBL Place 1 tablet under the tongue daily.   ferrous sulfate 325 (65 FE) MG EC tablet TAKE 1 TABLET BY MOUTH EVERY DAY   fish oil-omega-3 fatty acids 1000 MG capsule Take 1 g by mouth every morning.   levothyroxine (SYNTHROID) 50 MCG tablet TAKE 1 TABLET BY MOUTH IN THE MORNING   metoprolol tartrate (LOPRESSOR) 25 MG tablet TAKE ONE TABLET BY MOUTH TWICE A DAY   nitroGLYCERIN (NITROSTAT) 0.4 MG SL tablet Place 1 tablet (0.4 mg total) under the tongue every 5 (five) minutes as needed for chest pain.   [DISCONTINUED] Leuprolide Acetate, 6 Month, (LUPRON) 45 MG injection Inject 45 mg into the muscle every 3 (three) months.  (Patient not taking: Reported on 12/10/2021)   No facility-administered encounter medications on file as of 01/25/2022.    Review of Systems  Constitutional:  Negative for chills, fatigue and fever.  Respiratory:  Negative for cough, shortness of breath and wheezing.   Cardiovascular:  Negative for chest pain and leg swelling.  Musculoskeletal:  Positive for gait problem and joint swelling.  Skin:  Negative for rash and wound.  Psychiatric/Behavioral:  Positive for confusion. Negative for dysphoric mood. The patient is not nervous/anxious.     Immunization History  Administered Date(s) Administered   Fluad Quad(high Dose 65+) 11/21/2018, 12/25/2019, 01/02/2021, 12/16/2021   Influenza Split 11/01/2011   Influenza Whole 12/15/2006, 12/06/2007, 12/31/2009   Influenza, High Dose Seasonal PF 11/11/2014, 11/12/2015, 11/15/2016, 11/18/2017,  11/21/2018, 01/02/2021   Influenza,inj,Quad PF,6+ Mos 11/01/2012, 11/09/2013   Influenza-Unspecified 11/01/2011, 11/01/2012, 11/09/2013   Moderna SARS-COV2 Booster Vaccination 01/07/2020   Moderna Sars-Covid-2 Vaccination 02/26/2019, 03/26/2019   PFIZER(Purple Top)SARS-COV-2 Vaccination 12/29/2021   Pneumococcal Conjugate-13 11/09/2013   Pneumococcal Polysaccharide-23 02/23/2004   Td 02/22/2002   Tdap 01/22/2014   Pertinent  Health Maintenance Due  Topic Date Due   INFLUENZA VACCINE  Completed   COLONOSCOPY (Pts 45-42yr Insurance coverage will need to be confirmed)  Discontinued      01/28/2021    1:23 PM 12/02/2021    2:35 PM 12/10/2021   11:45 AM 12/18/2021   10:15 AM 01/25/2022    1:55 PM  Fall Risk  Falls in the past year? '1 1 1 1 1  '$ Was there an injury with Fall? 0 0 1 0 0  Fall Risk Category Calculator '2 2 3 2 2  '$ Fall Risk Category Moderate Moderate High Moderate Moderate  Patient Fall Risk Level Moderate fall risk Moderate fall risk Moderate fall risk Moderate fall risk Moderate fall risk  Patient at Risk for Falls Due to Impaired mobility;Impaired balance/gait History of fall(s) History of fall(s) History of fall(s);Impaired balance/gait;Impaired mobility History of fall(s);Impaired balance/gait;Impaired mobility  Fall risk Follow up Falls evaluation  completed Falls evaluation completed;Falls prevention discussed Falls evaluation completed Falls evaluation completed;Education provided;Falls prevention discussed Falls evaluation completed   Functional Status Survey:    Vitals:   01/25/22 1330  BP: 104/63  Pulse: 62  Resp: 20  Temp: 97.7 F (36.5 C)  SpO2: 98%  Weight: 146 lb 8 oz (66.5 kg)  Height: '5\' 9"'$  (1.753 m)   Body mass index is 21.63 kg/m. Physical Exam Vitals reviewed.  Constitutional:      General: He is not in acute distress. HENT:     Head: Normocephalic.  Eyes:     General:        Right eye: No discharge.        Left eye: No discharge.   Cardiovascular:     Rate and Rhythm: Normal rate and regular rhythm.     Pulses: Normal pulses.     Heart sounds: Normal heart sounds.  Pulmonary:     Effort: Pulmonary effort is normal. No respiratory distress.     Breath sounds: Normal breath sounds. No wheezing.  Abdominal:     General: Bowel sounds are normal. There is no distension.     Palpations: Abdomen is soft.     Tenderness: There is no abdominal tenderness.  Musculoskeletal:     Right wrist: Swelling and tenderness present. No snuff box tenderness or crepitus. Normal range of motion. Normal pulse.     Cervical back: Neck supple.     Right lower leg: No edema.     Left lower leg: No edema.     Comments: Right wrist warm to touch, no erythema/skin breakdown  Skin:    General: Skin is warm and dry.     Capillary Refill: Capillary refill takes less than 2 seconds.  Neurological:     General: No focal deficit present.     Mental Status: He is alert. Mental status is at baseline.     Motor: Weakness present.     Gait: Gait abnormal.     Comments: wheelchair  Psychiatric:        Mood and Affect: Mood normal.        Behavior: Behavior normal.     Comments: Very pleasant, follows commands, alert to self/person/place     Labs reviewed: Recent Labs    01/28/21 1454 02/03/21 0950 07/28/21 1025  NA 141 141 141  K 4.5 4.6 4.0  CL 106 108 109  CO2 '27 27 29  '$ GLUCOSE 82 89 84  BUN 20 23 24*  CREATININE 1.06 1.08 1.16  CALCIUM 9.4 8.9 9.1   Recent Labs    02/03/21 0950 07/28/21 1025  AST 19 15  ALT 21 11  ALKPHOS 59 49  BILITOT 0.7 0.5  PROT 6.3* 6.1*  ALBUMIN 3.2* 3.4*   Recent Labs    02/03/21 0950 07/28/21 1025  WBC 4.8 5.0  NEUTROABS 3.5 3.9  HGB 12.0* 11.3*  HCT 35.5* 34.3*  MCV 92.9 93.5  PLT 150 140*   Lab Results  Component Value Date   TSH 2.42 01/28/2021   Lab Results  Component Value Date   HGBA1C 5.6 07/01/2016   Lab Results  Component Value Date   CHOL 121 01/28/2021   HDL  51.10 01/28/2021   LDLCALC 55 01/28/2021   LDLDIRECT 159.3 05/17/2007   TRIG 76.0 01/28/2021   CHOLHDL 2 01/28/2021    Significant Diagnostic Results in last 30 days:  No results found.  Assessment/Plan 1. Right wrist pain - increased pain/swelling/warmth x 1 day -  pain increased with movement, improved today - denies recent injury - ? Gout/cellulitis/arthritis - refusing advanced workup - advised to reports increased pain/symptoms to nurse- wife/nursing aware - cont tylenol -will schedule x 3 days, then prn  2. Moderate vascular dementia without behavioral disturbance, psychotic disturbance, mood disturbance, or anxiety (HCC) - no behaviors - doing well in SNF - not on medication    Family/ staff Communication: plan discussed with patient, wife and nurse  Labs/tests ordered:  none

## 2022-02-05 ENCOUNTER — Non-Acute Institutional Stay (SKILLED_NURSING_FACILITY): Payer: Medicare HMO | Admitting: Orthopedic Surgery

## 2022-02-05 ENCOUNTER — Encounter: Payer: Self-pay | Admitting: Orthopedic Surgery

## 2022-02-05 DIAGNOSIS — B351 Tinea unguium: Secondary | ICD-10-CM | POA: Diagnosis not present

## 2022-02-05 DIAGNOSIS — I2510 Atherosclerotic heart disease of native coronary artery without angina pectoris: Secondary | ICD-10-CM | POA: Diagnosis not present

## 2022-02-05 DIAGNOSIS — I10 Essential (primary) hypertension: Secondary | ICD-10-CM | POA: Diagnosis not present

## 2022-02-05 DIAGNOSIS — I5032 Chronic diastolic (congestive) heart failure: Secondary | ICD-10-CM

## 2022-02-05 DIAGNOSIS — C61 Malignant neoplasm of prostate: Secondary | ICD-10-CM | POA: Diagnosis not present

## 2022-02-05 DIAGNOSIS — F01B0 Vascular dementia, moderate, without behavioral disturbance, psychotic disturbance, mood disturbance, and anxiety: Secondary | ICD-10-CM

## 2022-02-05 DIAGNOSIS — R69 Illness, unspecified: Secondary | ICD-10-CM | POA: Diagnosis not present

## 2022-02-05 DIAGNOSIS — M79672 Pain in left foot: Secondary | ICD-10-CM | POA: Diagnosis not present

## 2022-02-05 DIAGNOSIS — D509 Iron deficiency anemia, unspecified: Secondary | ICD-10-CM | POA: Diagnosis not present

## 2022-02-05 DIAGNOSIS — L84 Corns and callosities: Secondary | ICD-10-CM | POA: Diagnosis not present

## 2022-02-05 DIAGNOSIS — M79671 Pain in right foot: Secondary | ICD-10-CM | POA: Diagnosis not present

## 2022-02-05 DIAGNOSIS — E039 Hypothyroidism, unspecified: Secondary | ICD-10-CM

## 2022-02-05 NOTE — Progress Notes (Addendum)
Location:  [place] Room Number: [place] of Service:  SNF (31) Provider:  Yvonna Alanis, NP  Patient Care Team: Yvonna Alanis, NP as PCP - General (Adult Health Nurse Practitioner) [name], [facility] as Pharmacist (Pharmacist) Virgie Dad, MD as Consulting Physician (Internal Medicine)  Extended Emergency Contact Information Primary Emergency Contact: Mauriello,Geraldine Address: [address]          [place] [address] Johnnette Litter of [place] Phone: [phone] Relation: Spouse Secondary Emergency Contact: Tennis Must Mobile Phone: [phone] Relation: Daughter  Code Status: DNR Goals of care: Advanced Directive information    02/05/2022    9:55 AM  Advanced Directives  Does Patient Have a Medical Advance Directive? Yes  Type of Paramedic of [place];Living will;Out of facility DNR (pink MOST or yellow form)  Does patient want to make changes to medical advance directive? No - Patient declined  Copy of [place] in Chart? Yes - validated most recent copy scanned in chart (See row information)     Chief Complaint  Patient presents with   Medical Management of Chronic Issues    Routine Visit with provider on site at [facility].    HPI:  Pt is a 88 y.o. male seen today for medical management of chronic diseases.    PMH: CAD with CABG x 2, HTN, HLD, CHF, mitral valve repair 2015, prostate cancer, TURP 2018, left ureteroscopy with left stent placement 10/2021, Diverticulosis, GERD, GI bleed, hypothyroidism, osteopenia, and falls.   Vascular dementia/cognitive decline- 02/2021 MRI brain indicated small chronic cortical infarcts within left frontal/parietal lobes,moderate cerebral white matter chronic small vessel ischemic disease also noted, BIMS score 11/15 (10/24), wander guard placed on w/c, no behaviors, not on medication CHF- 2019 LVEF 55-60%, not on diuretics HTN-  BUN/creat 21/1.08 12/11/2021, remains on metoprolol CAD- LDL 55 12/11/2021, unable to tolerate aspirin due to past GI bleed, remains on Lipitor Hypothyroidism- TSH 3.72 12/11/2021 remains on levothyroxine Iron deficiency anemia- Hgb 12.1 12/11/2021, remains on ferrous sulfate/ B12 Prostate cancer- followed by Dr. Burnell Blanks, diagnosed 2012, TURP 2019, cancer now affecting left urethra- stent placed 10/2021, cipro stopped 12/12/2021, remains on Lupron injections Q3 months  Recent weights:  12/01- 140 lbs  11/01- 146.8 lbs  10/18- 149.3 lbs  Recent blood pressures:  12/15- 119/77  11/28- 104/63  11/21- 117/67      Past Surgical History:  Procedure Laterality Date   CARDIAC CATHETERIZATION  2015   CATARACT EXTRACTION, BILATERAL Bilateral 2014   per Dr. Gershon Crane    COLONOSCOPY  2011   per Dr. Sharlett Iles, benign polyps, no repeats needed    CORONARY ARTERY BYPASS GRAFT N/A 02/28/2013   Procedure: CORONARY ARTERY BYPASS GRAFTING (CABG);  Surgeon: Rexene Alberts, MD;  Location: [place];  Service: Open Heart Surgery;  Laterality: N/A;  CABG x 2,  using left internal mammary artery and right leg saphenous vein harvested endoscopically   ESOPHAGOGASTRODUODENOSCOPY (EGD) WITH PROPOFOL N/A 01/11/2019   Procedure: ESOPHAGOGASTRODUODENOSCOPY (EGD) WITH PROPOFOL;  Surgeon: Doran Stabler, MD;  [facility];  Service: Gastroenterology;  Laterality: N/A;   HEMORRHOID SURGERY     INTRAOPERATIVE TRANSESOPHAGEAL ECHOCARDIOGRAM N/A 02/28/2013   Procedure: INTRAOPERATIVE TRANSESOPHAGEAL ECHOCARDIOGRAM;  Surgeon: Rexene Alberts, MD;  Location: [place];  Service: Open Heart Surgery;  Laterality: N/A;   LEFT HEART CATHETERIZATION WITH CORONARY ANGIOGRAM N/A 02/26/2013   Procedure: LEFT HEART CATHETERIZATION WITH CORONARY ANGIOGRAM;  Surgeon:  Leonie Man, MD;  Location: [facility] CATH LAB;  Service: Cardiovascular;  Laterality: N/A;   MAZE N/A 02/28/2013   Procedure: MAZE;  Surgeon: Rexene Alberts, MD;   Location: [place];  Service: Open Heart Surgery;  Laterality: N/A;   MITRAL VALVE REPAIR N/A 02/28/2013   Procedure: MITRAL VALVE REPAIR (MVR);  Surgeon: Rexene Alberts, MD;  Location: [place];  Service: Open Heart Surgery;  Laterality: N/A;   PROSTATE BIOPSY     TRANSURETHRAL RESECTION OF PROSTATE N/A 02/04/2017   Procedure: TRANSURETHRAL RESECTION OF THE PROSTATE (TURP);  Surgeon: Alexis Frock, MD;  Location: [facility];  Service: Urology;  Laterality: N/A;    Allergies  Allergen Reactions   Other Other (See Comments)    ANESTHESIA.... 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 ANESTHESIA.... 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 Occurred post op CABG 2015, required readmission and rehab    Outpatient Encounter Medications as of 02/05/2022  Medication Sig   acetaminophen (TYLENOL) 500 MG tablet Take 2 tablets (1,000 mg total) by mouth 2 (two) times daily as needed.   atorvastatin (LIPITOR) 10 MG tablet TAKE 1 TABLET EVERY DAY AT 6PM   Cholecalciferol (VITAMIN D3 PO) Take 2,000 Units by mouth daily.   Cyanocobalamin 2500 MCG SUBL Place 1 tablet under the tongue daily.   ferrous sulfate 325 (65 FE) MG EC tablet TAKE 1 TABLET BY MOUTH EVERY DAY   fish oil-omega-3 fatty acids 1000 MG capsule Take 1 g by mouth every morning.   levothyroxine (SYNTHROID) 50 MCG tablet TAKE 1 TABLET BY MOUTH IN THE MORNING   metoprolol tartrate (LOPRESSOR) 25 MG tablet TAKE ONE TABLET BY MOUTH TWICE A DAY   nitroGLYCERIN (NITROSTAT) 0.4 MG SL tablet Place 1 tablet (0.4 mg total) under the tongue every 5 (five) minutes as needed for chest pain.   zinc oxide 20 % ointment Apply 1 Application topically as needed for irritation.   No facility-administered encounter medications on file as of 02/05/2022.    Review of Systems  Constitutional:  Negative for activity change, appetite change, fatigue and fever.  HENT:  Negative for congestion and trouble swallowing.   Eyes:  Negative for  discharge.  Respiratory:  Negative for cough, shortness of breath and wheezing.   Cardiovascular:  Negative for chest pain and leg swelling.  Gastrointestinal:  Negative for abdominal distention, abdominal pain, constipation, diarrhea, nausea and vomiting.  Genitourinary:  Negative for dysuria and frequency.  Musculoskeletal:  Positive for gait problem. Negative for arthralgias.  Skin:  Negative for wound.  Neurological:  Positive for weakness. Negative for dizziness and headaches.  Psychiatric/Behavioral:  Positive for confusion. Negative for dysphoric mood and sleep disturbance. The patient is not nervous/anxious.     Immunization History  Administered Date(s) Administered   Fluad Quad(high Dose 65+) 11/21/2018, 12/25/2019, 01/02/2021, 12/16/2021   Influenza Split 11/01/2011   Influenza Whole 12/15/2006, 12/06/2007, 12/31/2009   Influenza, High Dose Seasonal PF 11/11/2014, 11/12/2015, 11/15/2016, 11/18/2017, 11/21/2018, 01/02/2021   Influenza,inj,Quad PF,6+ Mos 11/01/2012, 11/09/2013   Influenza-Unspecified 11/01/2011, 11/01/2012, 11/09/2013   Moderna SARS-COV2 Booster Vaccination 01/07/2020   Moderna Sars-Covid-2 Vaccination 02/26/2019, 03/26/2019   PFIZER(Purple Top)SARS-COV-2 Vaccination 12/29/2021   Pneumococcal Conjugate-13 11/09/2013   Pneumococcal Polysaccharide-23 02/23/2004   Td 02/22/2002   Tdap 01/22/2014   Pertinent  Health Maintenance Due  Topic Date Due   INFLUENZA VACCINE  Completed   COLONOSCOPY (Pts 45-62yr Insurance coverage will need to be confirmed)  Discontinued  12/02/2021    2:35 PM 12/10/2021   11:45 AM 12/18/2021   10:15 AM 01/25/2022    1:55 PM 02/05/2022    9:54 AM  Fall Risk  Falls in the past year? '1 1 1 1 1  '$ Was there an injury with Fall? 0 1 0 0 0  Fall Risk Category Calculator '2 3 2 2 2  '$ Fall Risk Category Moderate High Moderate Moderate Moderate  Patient Fall Risk Level Moderate fall risk Moderate fall risk Moderate fall risk Moderate  fall risk Moderate fall risk  Patient at Risk for Falls Due to History of fall(s) History of fall(s) History of fall(s);Impaired balance/gait;Impaired mobility History of fall(s);Impaired balance/gait;Impaired mobility History of fall(s);Impaired balance/gait;Impaired mobility  Fall risk Follow up Falls evaluation completed;Falls prevention discussed Falls evaluation completed Falls evaluation completed;Education provided;Falls prevention discussed Falls evaluation completed Falls evaluation completed   Functional Status Survey:    Vitals:   02/05/22 0949  BP: (!) 150/72  Pulse: 66  Resp: (!) 22  Temp: 97.7 F (36.5 C)  SpO2: 93%  Weight: 146 lb 8 oz (66.5 kg)  Height: '5\' 9"'$  (1.753 m)   Body mass index is 21.63 kg/m. Physical Exam Vitals reviewed.  Constitutional:      General: He is not in acute distress. HENT:     Head: Normocephalic.     Right Ear: There is no impacted cerumen.     Left Ear: There is no impacted cerumen.     Nose: Nose normal.     Mouth/Throat:     Mouth: Mucous membranes are moist.  Eyes:     General:        Right eye: No discharge.        Left eye: No discharge.  Cardiovascular:     Rate and Rhythm: Normal rate and regular rhythm.     Pulses: Normal pulses.     Heart sounds: Normal heart sounds.  Pulmonary:     Effort: Pulmonary effort is normal. No respiratory distress.     Breath sounds: Normal breath sounds. No wheezing.  Abdominal:     General: Bowel sounds are normal. There is no distension.     Palpations: Abdomen is soft.     Tenderness: There is no abdominal tenderness.  Musculoskeletal:     Cervical back: Neck supple.     Right lower leg: No edema.     Left lower leg: No edema.  Skin:    General: Skin is warm and dry.     Capillary Refill: Capillary refill takes less than 2 seconds.  Neurological:     General: No focal deficit present.     Mental Status: He is alert. Mental status is at baseline.     Motor: Weakness present.      Gait: Gait abnormal.     Comments: wheelchair  Psychiatric:        Mood and Affect: Mood normal.        Behavior: Behavior normal.     Labs reviewed: Recent Labs    07/28/21 1025  NA 141  K 4.0  CL 109  CO2 29  GLUCOSE 84  BUN 24*  CREATININE 1.16  CALCIUM 9.1   Recent Labs    07/28/21 1025  AST 15  ALT 11  ALKPHOS 49  BILITOT 0.5  PROT 6.1*  ALBUMIN 3.4*   Recent Labs    07/28/21 1025  WBC 5.0  NEUTROABS 3.9  HGB 11.3*  HCT 34.3*  MCV 93.5  PLT  140*   Lab Results  Component Value Date   TSH 2.42 01/28/2021   Lab Results  Component Value Date   HGBA1C 5.6 07/01/2016   Lab Results  Component Value Date   CHOL 121 01/28/2021   HDL 51.10 01/28/2021   LDLCALC 55 01/28/2021   LDLDIRECT 159.3 05/17/2007   TRIG 76.0 01/28/2021   CHOLHDL 2 01/28/2021    Significant Diagnostic Results in last 30 days:  No results found.  Assessment/Plan 1. Chronic diastolic CHF (congestive heart failure) (HCC) - compensated - off diuretics  2. Moderate vascular dementia without behavioral disturbance, psychotic disturbance, mood disturbance, or anxiety (HCC) - no behaviors off medication - wander guard placed on w/c  3. Essential hypertension - controlled - cont metoprolol  4. Coronary artery disease involving native coronary artery of native heart without angina pectoris - off asa due to GI bleed - cont statin  5. Acquired hypothyroidism - cont levothyroxine  6. Iron deficiency anemia, unspecified iron deficiency anemia type - hgb stable - cont ferrous sulfate  7. ADENOCARCINOMA, PROSTATE, GLEASON GRADE 3 - followed by urology - on Lupron injections q3 months    Family/ staff Communication: plan discussed with patient and nurse  Labs/tests ordered:  none

## 2022-02-16 ENCOUNTER — Telehealth: Payer: Self-pay | Admitting: Adult Health

## 2022-02-16 DIAGNOSIS — I10 Essential (primary) hypertension: Secondary | ICD-10-CM | POA: Diagnosis not present

## 2022-02-16 DIAGNOSIS — D649 Anemia, unspecified: Secondary | ICD-10-CM | POA: Diagnosis not present

## 2022-02-16 DIAGNOSIS — N390 Urinary tract infection, site not specified: Secondary | ICD-10-CM | POA: Diagnosis not present

## 2022-02-16 NOTE — Telephone Encounter (Signed)
Nurse called to report Mr Shorten has blood in his urine. His vitals are stable. Has baseline confusion unchanged at this time. Hx of prostate ca and urethral stent. His nurse spoke with his son and they indicate they were prefer for him to be treated in the facility at this time and if worsening staff should notify the son again. Will order stat UA C and S CBC and BMP. Provider at facility to f/u in am.

## 2022-02-17 ENCOUNTER — Encounter: Payer: Self-pay | Admitting: Orthopedic Surgery

## 2022-02-17 ENCOUNTER — Non-Acute Institutional Stay (SKILLED_NURSING_FACILITY): Payer: Medicare HMO | Admitting: Orthopedic Surgery

## 2022-02-17 DIAGNOSIS — N368 Other specified disorders of urethra: Secondary | ICD-10-CM

## 2022-02-17 DIAGNOSIS — C61 Malignant neoplasm of prostate: Secondary | ICD-10-CM | POA: Diagnosis not present

## 2022-02-17 DIAGNOSIS — R319 Hematuria, unspecified: Secondary | ICD-10-CM

## 2022-02-17 DIAGNOSIS — D509 Iron deficiency anemia, unspecified: Secondary | ICD-10-CM

## 2022-02-17 NOTE — Progress Notes (Signed)
Location:  [place] Room Number: [place] of Service:  SNF [phone]) Provider:  Windell Moulding, NP  Yvonna Alanis, NP  Patient Care Team: Yvonna Alanis, NP as PCP - General (Adult Health Nurse Practitioner) [name], [facility] as Pharmacist (Pharmacist) Virgie Dad, MD as Consulting Physician (Internal Medicine)  Extended Emergency Contact Information Primary Emergency Contact: Reader,Geraldine Address: [address]          [place] [address] Johnnette Litter of [place] Phone: [phone] Relation: Spouse Secondary Emergency Contact: Tennis Must [place] Phone: [phone] Relation: Daughter  Code Status:  DNR Goals of care: Advanced Directive information    02/17/2022    9:31 AM  Advanced Directives  Does Patient Have a Medical Advance Directive? Yes  Type of Paramedic of [place];Living will;Out of facility DNR (pink MOST or yellow form)  Does patient want to make changes to medical advance directive? No - Patient declined  Copy of [place] in Chart? Yes - validated most recent copy scanned in chart (See row information)     Chief Complaint  Patient presents with   Acute Visit    Patient is being seen for acute Hematuria    HPI:  Pt is a 88 y.o. male seen today for an acute visit for hematuria.   PMH: CAD with CABG x 2, HTN, HLD, CHF, mitral valve repair 2015, prostate cancer, TURP 2018, left ureteroscopy with left stent placement 10/2021, Diverticulosis, GERD, GI bleed, hypothyroidism, osteopenia, and falls.   H/o prostate cancer 2012, TURP 2019. Left urethra now affected per Dr. Burnell Blanks. He receives Lupron injections q3 months- scheduled 02/2022.12/26 nursing reports blood tinged urine. UA/culture, cbc/diff, bmp ordered by on call provider. Denies fever, fatigue, dysuria, frequency, flank/low back pain. Hgb 10.7, Hct 31.8, BUN/creat 23/1.02. UA: cloudy appearance, 2+ blood, 3+ leukocytes,  protein 1+, WBC > 60, RBC 3-10. Culture pending.   Past Surgical History:  Procedure Laterality Date   CARDIAC CATHETERIZATION  2015   CATARACT EXTRACTION, BILATERAL Bilateral 2014   per Dr. Gershon Crane    COLONOSCOPY  2011   per Dr. Sharlett Iles, benign polyps, no repeats needed    CORONARY ARTERY BYPASS GRAFT N/A 02/28/2013   Procedure: CORONARY ARTERY BYPASS GRAFTING (CABG);  Surgeon: Rexene Alberts, MD;  Location: [place];  Service: Open Heart Surgery;  Laterality: N/A;  CABG x 2,  using left internal mammary artery and right leg saphenous vein harvested endoscopically   ESOPHAGOGASTRODUODENOSCOPY (EGD) WITH PROPOFOL N/A 01/11/2019   Procedure: ESOPHAGOGASTRODUODENOSCOPY (EGD) WITH PROPOFOL;  Surgeon: Doran Stabler, MD;  [facility];  Service: Gastroenterology;  Laterality: N/A;   HEMORRHOID SURGERY     INTRAOPERATIVE TRANSESOPHAGEAL ECHOCARDIOGRAM N/A 02/28/2013   Procedure: INTRAOPERATIVE TRANSESOPHAGEAL ECHOCARDIOGRAM;  Surgeon: Rexene Alberts, MD;  Location: [place];  Service: Open Heart Surgery;  Laterality: N/A;   LEFT HEART CATHETERIZATION WITH CORONARY ANGIOGRAM N/A 02/26/2013   Procedure: LEFT HEART CATHETERIZATION WITH CORONARY ANGIOGRAM;  Surgeon: Leonie Man, MD;  Location: [facility] CATH LAB;  Service: Cardiovascular;  Laterality: N/A;   MAZE N/A 02/28/2013   Procedure: MAZE;  Surgeon: Rexene Alberts, MD;  Location: [place];  Service: Open Heart Surgery;  Laterality: N/A;   MITRAL VALVE REPAIR N/A 02/28/2013   Procedure: MITRAL VALVE REPAIR (MVR);  Surgeon: Rexene Alberts, MD;  Location: [place];  Service: Open Heart Surgery;  Laterality: N/A;   PROSTATE BIOPSY     TRANSURETHRAL  RESECTION OF PROSTATE N/A 02/04/2017   Procedure: TRANSURETHRAL RESECTION OF THE PROSTATE (TURP);  Surgeon: Alexis Frock, MD;  [facility];  Service: Urology;  Laterality: N/A;    Allergies  Allergen Reactions   Other Other (See Comments)    ANESTHESIA.... 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 ANESTHESIA.... 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 Occurred post op CABG 2015, required readmission and rehab    Outpatient Encounter Medications as of 02/17/2022  Medication Sig   acetaminophen (TYLENOL) 500 MG tablet Take 2 tablets (1,000 mg total) by mouth 2 (two) times daily as needed.   atorvastatin (LIPITOR) 10 MG tablet TAKE 1 TABLET EVERY DAY AT 6PM   Cholecalciferol (VITAMIN D3 PO) Take 2,000 Units by mouth daily.   Cyanocobalamin 2500 MCG SUBL Place 1 tablet under the tongue daily.   ferrous sulfate 325 (65 FE) MG EC tablet TAKE 1 TABLET BY MOUTH EVERY DAY   fish oil-omega-3 fatty acids 1000 MG capsule Take 1 g by mouth every morning.   levothyroxine (SYNTHROID) 50 MCG tablet TAKE 1 TABLET BY MOUTH IN THE MORNING   metoprolol tartrate (LOPRESSOR) 25 MG tablet TAKE ONE TABLET BY MOUTH TWICE A DAY   nitroGLYCERIN (NITROSTAT) 0.4 MG SL tablet Place 1 tablet (0.4 mg total) under the tongue every 5 (five) minutes as needed for chest pain.   zinc oxide 20 % ointment Apply 1 Application topically as needed for irritation.   No facility-administered encounter medications on file as of 02/17/2022.    Review of Systems  Constitutional:  Negative for activity change, appetite change, fatigue and fever.  Respiratory:  Negative for cough, shortness of breath and wheezing.   Cardiovascular:  Negative for chest pain and leg swelling.  Gastrointestinal:  Negative for abdominal distention and abdominal pain.  Genitourinary:  Positive for hematuria. Negative for dysuria and frequency.  Musculoskeletal:  Negative for back pain.  Psychiatric/Behavioral:  Positive for confusion. Negative for dysphoric mood. The patient is not nervous/anxious.     Immunization History  Administered Date(s) Administered   Fluad Quad(high Dose 65+) 11/21/2018, 12/25/2019, 01/02/2021, 12/16/2021   Influenza Split 11/01/2011   Influenza Whole 12/15/2006, 12/06/2007,  12/31/2009   Influenza, High Dose Seasonal PF 11/11/2014, 11/12/2015, 11/15/2016, 11/18/2017, 11/21/2018, 01/02/2021   Influenza,inj,Quad PF,6+ Mos 11/01/2012, 11/09/2013   Influenza-Unspecified 11/01/2011, 11/01/2012, 11/09/2013   Moderna SARS-COV2 Booster Vaccination 01/07/2020   Moderna Sars-Covid-2 Vaccination 02/26/2019, 03/26/2019   PFIZER(Purple Top)SARS-COV-2 Vaccination 12/29/2021   Pneumococcal Conjugate-13 11/09/2013   Pneumococcal Polysaccharide-23 02/23/2004   Td 02/22/2002   Tdap 01/22/2014   Pertinent  Health Maintenance Due  Topic Date Due   INFLUENZA VACCINE  Completed   COLONOSCOPY (Pts 45-26yr Insurance coverage will need to be confirmed)  Discontinued      12/02/2021    2:35 PM 12/10/2021   11:45 AM 12/18/2021   10:15 AM 01/25/2022    1:55 PM 02/05/2022    9:54 AM  Fall Risk  Falls in the past year? '1 1 1 1 1  '$ Was there an injury with Fall? 0 1 0 0 0  Fall Risk Category Calculator '2 3 2 2 2  '$ Fall Risk Category Moderate High Moderate Moderate Moderate  Patient Fall Risk Level Moderate fall risk Moderate fall risk Moderate fall risk Moderate fall risk Moderate fall risk  Patient at Risk for Falls Due to History of fall(s) History of fall(s) History of fall(s);Impaired balance/gait;Impaired mobility History of fall(s);Impaired balance/gait;Impaired mobility History of fall(s);Impaired balance/gait;Impaired  mobility  Fall risk Follow up Falls evaluation completed;Falls prevention discussed Falls evaluation completed Falls evaluation completed;Education provided;Falls prevention discussed Falls evaluation completed Falls evaluation completed   Functional Status Survey:    Vitals:   02/17/22 0928  BP: 120/63  Pulse: 64  Resp: 20  Temp: (!) 97.5 F (36.4 C)  TempSrc: Temporal  SpO2: 93%  Weight: 146 lb 12.8 oz (66.6 kg)  Height: '5\' 9"'$  (1.753 m)   Body mass index is 21.68 kg/m. Physical Exam Vitals reviewed.  Constitutional:      General: He is not in  acute distress. HENT:     Head: Normocephalic.  Eyes:     General:        Right eye: No discharge.        Left eye: No discharge.  Cardiovascular:     Rate and Rhythm: Normal rate and regular rhythm.     Pulses: Normal pulses.     Heart sounds: Normal heart sounds.  Pulmonary:     Effort: Pulmonary effort is normal. No respiratory distress.     Breath sounds: Normal breath sounds. No wheezing.  Abdominal:     General: Bowel sounds are normal. There is no distension.     Palpations: Abdomen is soft.     Tenderness: There is no abdominal tenderness. There is no right CVA tenderness or left CVA tenderness.  Musculoskeletal:     Cervical back: Neck supple.     Right lower leg: No edema.     Left lower leg: No edema.  Skin:    General: Skin is warm and dry.     Capillary Refill: Capillary refill takes less than 2 seconds.  Neurological:     General: No focal deficit present.     Mental Status: He is alert. Mental status is at baseline.     Motor: Weakness present.     Gait: Gait abnormal.     Comments: wheelchair  Psychiatric:        Mood and Affect: Mood normal.        Behavior: Behavior normal.     Labs reviewed: Recent Labs    07/28/21 1025  NA 141  K 4.0  CL 109  CO2 29  GLUCOSE 84  BUN 24*  CREATININE 1.16  CALCIUM 9.1   Recent Labs    07/28/21 1025  AST 15  ALT 11  ALKPHOS 49  BILITOT 0.5  PROT 6.1*  ALBUMIN 3.4*   Recent Labs    07/28/21 1025  WBC 5.0  NEUTROABS 3.9  HGB 11.3*  HCT 34.3*  MCV 93.5  PLT 140*   Lab Results  Component Value Date   TSH 2.42 01/28/2021   Lab Results  Component Value Date   HGBA1C 5.6 07/01/2016   Lab Results  Component Value Date   CHOL 121 01/28/2021   HDL 51.10 01/28/2021   LDLCALC 55 01/28/2021   LDLDIRECT 159.3 05/17/2007   TRIG 76.0 01/28/2021   CHOLHDL 2 01/28/2021    Significant Diagnostic Results in last 30 days:  No results found.  Assessment/Plan 1. Hematuria, unspecified type - noted  12/26 - hgb now 10.7 > was 12.1 (10/20) - asymptomatic - urine culture pending - suspect related to prostate cancer  2. ADENOCARCINOMA, PROSTATE, GLEASON GRADE 3 - followed by Dr. Harlow Asa - diagnosed 2012 - TURP 2019 - remains on Lupron injections q3 months - scheduled 02/2022? Will have [facility] confirm/notify about hematuria  3. Urethral obstruction - left urethral stent placed 10/2021  4. Iron deficiency anemia, unspecified iron deficiency anemia type - hgb 10.7 > was 12.1 (10/20) - see above - cont ferrous sulfate and B12    Family/ staff Communication: plan discussed with patient, wife and nurse  Labs/tests ordered:  urine culture- pending

## 2022-02-17 NOTE — Telephone Encounter (Signed)
I will see him today.

## 2022-02-18 LAB — CBC AND DIFFERENTIAL
HCT: 32 — AB (ref 41–53)
Hemoglobin: 10.7 — AB (ref 13.5–17.5)
Platelets: 182 10*3/uL (ref 150–400)
WBC: 6.9

## 2022-02-18 LAB — COMPREHENSIVE METABOLIC PANEL
Calcium: 8.5 — AB (ref 8.7–10.7)
eGFR: 69

## 2022-02-18 LAB — CBC: RBC: 3.54 — AB (ref 3.87–5.11)

## 2022-02-18 LAB — BASIC METABOLIC PANEL
BUN: 23 — AB (ref 4–21)
CO2: 24 — AB (ref 13–22)
Chloride: 104 (ref 99–108)
Creatinine: 1 (ref 0.6–1.3)
Glucose: 116
Potassium: 4.6 mEq/L (ref 3.5–5.1)
Sodium: 136 — AB (ref 137–147)

## 2022-03-03 DIAGNOSIS — C61 Malignant neoplasm of prostate: Secondary | ICD-10-CM | POA: Diagnosis not present

## 2022-03-03 DIAGNOSIS — Z960 Presence of urogenital implants: Secondary | ICD-10-CM | POA: Diagnosis not present

## 2022-03-03 DIAGNOSIS — I2510 Atherosclerotic heart disease of native coronary artery without angina pectoris: Secondary | ICD-10-CM | POA: Diagnosis not present

## 2022-03-03 DIAGNOSIS — Z951 Presence of aortocoronary bypass graft: Secondary | ICD-10-CM | POA: Diagnosis not present

## 2022-03-03 DIAGNOSIS — I252 Old myocardial infarction: Secondary | ICD-10-CM | POA: Diagnosis not present

## 2022-03-03 DIAGNOSIS — N135 Crossing vessel and stricture of ureter without hydronephrosis: Secondary | ICD-10-CM | POA: Diagnosis not present

## 2022-03-04 ENCOUNTER — Encounter: Payer: Self-pay | Admitting: Family Medicine

## 2022-03-04 ENCOUNTER — Non-Acute Institutional Stay (SKILLED_NURSING_FACILITY): Payer: Medicare HMO | Admitting: Internal Medicine

## 2022-03-04 ENCOUNTER — Encounter: Payer: Self-pay | Admitting: Internal Medicine

## 2022-03-04 DIAGNOSIS — D509 Iron deficiency anemia, unspecified: Secondary | ICD-10-CM

## 2022-03-04 DIAGNOSIS — I2510 Atherosclerotic heart disease of native coronary artery without angina pectoris: Secondary | ICD-10-CM

## 2022-03-04 DIAGNOSIS — R2681 Unsteadiness on feet: Secondary | ICD-10-CM | POA: Diagnosis not present

## 2022-03-04 DIAGNOSIS — R69 Illness, unspecified: Secondary | ICD-10-CM | POA: Diagnosis not present

## 2022-03-04 DIAGNOSIS — I5032 Chronic diastolic (congestive) heart failure: Secondary | ICD-10-CM | POA: Diagnosis not present

## 2022-03-04 DIAGNOSIS — I10 Essential (primary) hypertension: Secondary | ICD-10-CM

## 2022-03-04 DIAGNOSIS — E039 Hypothyroidism, unspecified: Secondary | ICD-10-CM

## 2022-03-04 DIAGNOSIS — C61 Malignant neoplasm of prostate: Secondary | ICD-10-CM | POA: Diagnosis not present

## 2022-03-04 DIAGNOSIS — F01B0 Vascular dementia, moderate, without behavioral disturbance, psychotic disturbance, mood disturbance, and anxiety: Secondary | ICD-10-CM

## 2022-03-04 NOTE — Progress Notes (Signed)
Location:  [place] Room Number: [place] of Service:  SNF [phone]) Provider:  Veleta Miners, MD  Yvonna Alanis, NP  Patient Care Team: Yvonna Alanis, NP as PCP - General (Adult Health Nurse Practitioner) [name], [facility] as Pharmacist (Pharmacist) Virgie Dad, MD as Consulting Physician (Internal Medicine)  Extended Emergency Contact Information Primary Emergency Contact: Duncombe,Geraldine Address: [address]          [place] [address] Johnnette Litter of [place] Phone: [phone] Relation: Spouse Secondary Emergency Contact: Tennis Must Mobile Phone: [phone] Relation: Daughter  Code Status:  DNR Goals of care: Advanced Directive information    03/04/2022   11:38 AM  Advanced Directives  Does Patient Have a Medical Advance Directive? Yes  Type of Paramedic of [place];Living will;Out of facility DNR (pink MOST or yellow form)  Does patient want to make changes to medical advance directive? No - Patient declined  Copy of [place] in Chart? Yes - validated most recent copy scanned in chart (See row information)     Chief Complaint  Patient presents with   Medical Management of Chronic Issues    Routine Visit    HPI:  Pt is a 89 y.o. male seen today for medical management of chronic diseases.    Lives in SNF with his Wife Patient has a history of CAD with CABG x2, hypertension, hyperlipidemia, CHF, mitral valve repair 2015,  history of GI bleed and hypothyroidism    prostate cancer diagnosed in 2012, underwent TURP in 2019 Now.  Has advanced and has affected left urethra.  Has had stent Also on Lupron injections every 3 months Patient also has now unstable gait and ambulates with wheelchair Cognitive decline Recent MRI shows small cortical infarcts and needing more help with ADLs.  Recent Stent Change per Urology No Issues No Pain no Hematuria No Fever On Cipro for  Prophylaxis per urology  No new Nursing issues. No Behavior issues His weight is stable Wheelchair Bound Can do Transfers with Mild Assist No Falls Wt Readings from Last 3 Encounters:  03/04/22 145 lb 9.6 oz (66 kg)  02/17/22 146 lb 12.8 oz (66.6 kg)  02/05/22 146 lb 8 oz (66.5 kg)    Past Medical History:  Diagnosis Date   Complication of anesthesia    "serious cognisance problems since my OR in January" (03/13/2013   Coronary artery disease    Diverticulosis    Hiatal hernia    Hx of echocardiogram    Echo 5/16:  Mild focal basal septal hypertrophy, EF 55%, mild AI, MV repair ok with trivial MR (mean 3 mmHg), mild LAE, mild RAE, PASP 35 mmHg   Hypertension    Hypothyroidism    Irritable bowel syndrome    PAF (paroxysmal atrial fibrillation) ([place]) 05/03/2014   Personal history of colonic polyps    tubular adenoma   Pleural effusion, bilateral 03/12/2013   Small to moderate, L>R   Prostate cancer [facility]) 1991   sees Dr. Carlota Raspberry at [facility], observation only    S/P CABG x 2 02/28/2013   LIMA to LAD, SVG to OM1, EVH via right thigh   S/P Maze operation for atrial fibrillation 02/28/2013   Complete bilateral atrial lesion set using bipolar radiofrequency and cryothermy ablation with clipping of LA appendage   S/P mitral valve repair, maze procedure, and CABG x2 02/28/2013   Complex valvuloplasty including triangular resection of posterior leaflet, 30 mm Sorin Memo  3D ring annuloplasty   Past Surgical History:  Procedure Laterality Date   CARDIAC CATHETERIZATION  2015   CATARACT EXTRACTION, BILATERAL Bilateral 2014   per Dr. Gershon Crane    COLONOSCOPY  2011   per Dr. Sharlett Iles, benign polyps, no repeats needed    CORONARY ARTERY BYPASS GRAFT N/A 02/28/2013   Procedure: CORONARY ARTERY BYPASS GRAFTING (CABG);  Surgeon: Rexene Alberts, MD;  Location: [place];  Service: Open Heart Surgery;  Laterality: N/A;  CABG x 2,  using left internal mammary artery and right leg saphenous vein harvested  endoscopically   ESOPHAGOGASTRODUODENOSCOPY (EGD) WITH PROPOFOL N/A 01/11/2019   Procedure: ESOPHAGOGASTRODUODENOSCOPY (EGD) WITH PROPOFOL;  Surgeon: Doran Stabler, MD;  [facility];  Service: Gastroenterology;  Laterality: N/A;   HEMORRHOID SURGERY     INTRAOPERATIVE TRANSESOPHAGEAL ECHOCARDIOGRAM N/A 02/28/2013   Procedure: INTRAOPERATIVE TRANSESOPHAGEAL ECHOCARDIOGRAM;  Surgeon: Rexene Alberts, MD;  Location: [place];  Service: Open Heart Surgery;  Laterality: N/A;   LEFT HEART CATHETERIZATION WITH CORONARY ANGIOGRAM N/A 02/26/2013   Procedure: LEFT HEART CATHETERIZATION WITH CORONARY ANGIOGRAM;  Surgeon: Leonie Man, MD;  Location: [facility] CATH LAB;  Service: Cardiovascular;  Laterality: N/A;   MAZE N/A 02/28/2013   Procedure: MAZE;  Surgeon: Rexene Alberts, MD;  Location: [place];  Service: Open Heart Surgery;  Laterality: N/A;   MITRAL VALVE REPAIR N/A 02/28/2013   Procedure: MITRAL VALVE REPAIR (MVR);  Surgeon: Rexene Alberts, MD;  Location: [place];  Service: Open Heart Surgery;  Laterality: N/A;   PROSTATE BIOPSY     TRANSURETHRAL RESECTION OF PROSTATE N/A 02/04/2017   Procedure: TRANSURETHRAL RESECTION OF THE PROSTATE (TURP);  Surgeon: Alexis Frock, MD;  [facility];  Service: Urology;  Laterality: N/A;    Allergies  Allergen Reactions   Other Other (See Comments)    ANESTHESIA.... 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 ANESTHESIA.... 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 Occurred post op CABG 2015, required readmission and rehab    Outpatient Encounter Medications as of 03/04/2022  Medication Sig   acetaminophen (TYLENOL) 500 MG tablet Take 2 tablets (1,000 mg total) by mouth 2 (two) times daily as needed.   atorvastatin (LIPITOR) 10 MG tablet TAKE 1 TABLET EVERY DAY AT 6PM   Cholecalciferol (VITAMIN D3 PO) Take 2,000 Units by mouth daily.   ciprofloxacin (CIPRO) 500 MG tablet Take 500 mg by mouth 2 (two) times daily.    Cyanocobalamin 2500 MCG SUBL Place 1 tablet under the tongue daily.   ferrous sulfate 325 (65 FE) MG EC tablet TAKE 1 TABLET BY MOUTH EVERY DAY   fish oil-omega-3 fatty acids 1000 MG capsule Take 1 g by mouth every morning.   levothyroxine (SYNTHROID) 50 MCG tablet TAKE 1 TABLET BY MOUTH IN THE MORNING   metoprolol tartrate (LOPRESSOR) 25 MG tablet TAKE ONE TABLET BY MOUTH TWICE A DAY   nitroGLYCERIN (NITROSTAT) 0.4 MG SL tablet Place 1 tablet (0.4 mg total) under the tongue every 5 (five) minutes as needed for chest pain.   SPIKEVAX syringe Inject 0.5 mLs into the muscle once.   zinc oxide 20 % ointment Apply 1 Application topically as needed for irritation.   No facility-administered encounter medications on file as of 03/04/2022.    Review of Systems  Constitutional:  Negative for activity change, appetite change and unexpected weight change.  HENT: Negative.    Respiratory:  Negative for cough and shortness of breath.   Cardiovascular:  Negative for leg swelling.  Gastrointestinal:  Negative for constipation.  Genitourinary:  Negative for frequency.  Musculoskeletal:  Negative for arthralgias, gait problem and myalgias.  Skin: Negative.  Negative for rash.  Neurological:  Negative for dizziness and weakness.  Psychiatric/Behavioral:  Negative for confusion and sleep disturbance.   All other systems reviewed and are negative.   Immunization History  Administered Date(s) Administered   Fluad Quad(high Dose 65+) 11/21/2018, 12/25/2019, 01/02/2021, 12/16/2021   Influenza Split 11/01/2011   Influenza Whole 12/15/2006, 12/06/2007, 12/31/2009   Influenza, High Dose Seasonal PF 11/11/2014, 11/12/2015, 11/15/2016, 11/18/2017, 11/21/2018, 01/02/2021   Influenza,inj,Quad PF,6+ Mos 11/01/2012, 11/09/2013   Influenza-Unspecified 11/01/2011, 11/01/2012, 11/09/2013   Moderna SARS-COV2 Booster Vaccination 01/07/2020   Moderna Sars-Covid-2 Vaccination 02/26/2019, 03/26/2019   PFIZER(Purple  Top)SARS-COV-2 Vaccination 12/29/2021   Pneumococcal Conjugate-13 11/09/2013   Pneumococcal Polysaccharide-23 02/23/2004   Td 02/22/2002   Tdap 01/22/2014   Pertinent  Health Maintenance Due  Topic Date Due   INFLUENZA VACCINE  Completed   COLONOSCOPY (Pts 45-37yr Insurance coverage will need to be confirmed)  Discontinued      12/02/2021    2:35 PM 12/10/2021   11:45 AM 12/18/2021   10:15 AM 01/25/2022    1:55 PM 02/05/2022    9:54 AM  Fall Risk  Falls in the past year? '1 1 1 1 1  '$ Was there an injury with Fall? 0 1 0 0 0  Fall Risk Category Calculator '2 3 2 2 2  '$ Fall Risk Category Moderate High Moderate Moderate Moderate  Patient Fall Risk Level Moderate fall risk Moderate fall risk Moderate fall risk Moderate fall risk Moderate fall risk  Patient at Risk for Falls Due to History of fall(s) History of fall(s) History of fall(s);Impaired balance/gait;Impaired mobility History of fall(s);Impaired balance/gait;Impaired mobility History of fall(s);Impaired balance/gait;Impaired mobility  Fall risk Follow up Falls evaluation completed;Falls prevention discussed Falls evaluation completed Falls evaluation completed;Education provided;Falls prevention discussed Falls evaluation completed Falls evaluation completed   Functional Status Survey:    Vitals:   03/04/22 1134  BP: 112/76  Pulse: 62  Resp: 20  Temp: (!) 97.5 F (36.4 C)  TempSrc: Temporal  SpO2: 94%  Weight: 145 lb 9.6 oz (66 kg)  Height: '5\' 9"'$  (1.753 m)   Body mass index is 21.5 kg/m. Physical Exam Vitals reviewed.  Constitutional:      Appearance: Normal appearance.  HENT:     Head: Normocephalic.     Nose: Nose normal.     Mouth/Throat:     Mouth: Mucous membranes are moist.     Pharynx: Oropharynx is clear.  Eyes:     Pupils: Pupils are equal, round, and reactive to light.  Cardiovascular:     Rate and Rhythm: Normal rate and regular rhythm.     Pulses: Normal pulses.     Heart sounds: No murmur  heard. Pulmonary:     Effort: Pulmonary effort is normal. No respiratory distress.     Breath sounds: Normal breath sounds. No rales.  Abdominal:     General: Abdomen is flat. Bowel sounds are normal.     Palpations: Abdomen is soft.  Musculoskeletal:        General: No swelling.     Cervical back: Neck supple.  Skin:    General: Skin is warm.  Neurological:     General: No focal deficit present.     Mental Status: He is alert.  Psychiatric:        Mood and Affect: Mood normal.  Thought Content: Thought content normal.     Labs reviewed: Recent Labs    07/28/21 1025  NA 141  K 4.0  CL 109  CO2 29  GLUCOSE 84  BUN 24*  CREATININE 1.16  CALCIUM 9.1   Recent Labs    07/28/21 1025  AST 15  ALT 11  ALKPHOS 49  BILITOT 0.5  PROT 6.1*  ALBUMIN 3.4*   Recent Labs    07/28/21 1025  WBC 5.0  NEUTROABS 3.9  HGB 11.3*  HCT 34.3*  MCV 93.5  PLT 140*   Lab Results  Component Value Date   TSH 2.42 01/28/2021   Lab Results  Component Value Date   HGBA1C 5.6 07/01/2016   Lab Results  Component Value Date   CHOL 121 01/28/2021   HDL 51.10 01/28/2021   LDLCALC 55 01/28/2021   LDLDIRECT 159.3 05/17/2007   TRIG 76.0 01/28/2021   CHOLHDL 2 01/28/2021    Significant Diagnostic Results in last 30 days:  No results found.  Assessment/Plan 1. Moderate vascular dementia without behavioral disturbance, psychotic disturbance, mood disturbance, or anxiety (HCC) On Statin Not on aspirin due to Urethral Bleed and GI bleed LDL 55 in 12/22 Needs help with his ADLS 2. Iron deficiency anemia, unspecified iron deficiency anemia type Hgb is low Due to hematuria On Iron  3. ADENOCARCINOMA, PROSTATE, GLEASON GRADE 3 Urethral Stricture stent placement On Cipro For Prophylaxis  4. Chronic diastolic CHF (congestive heart failure) (HCC) No Diuretics  5. Essential hypertension On Toprol  6. Acquired hypothyroidism TSH normal 12/22  7. Coronary artery disease   Statin 8. Unstable gait Stays in Electrical engineer staff Communication:   Labs/tests ordered:

## 2022-03-04 NOTE — Progress Notes (Signed)
Quick abstract

## 2022-03-19 DIAGNOSIS — C61 Malignant neoplasm of prostate: Secondary | ICD-10-CM | POA: Diagnosis not present

## 2022-03-31 ENCOUNTER — Non-Acute Institutional Stay (SKILLED_NURSING_FACILITY): Payer: Medicare HMO | Admitting: Adult Health

## 2022-03-31 ENCOUNTER — Encounter: Payer: Self-pay | Admitting: Adult Health

## 2022-03-31 DIAGNOSIS — F01B0 Vascular dementia, moderate, without behavioral disturbance, psychotic disturbance, mood disturbance, and anxiety: Secondary | ICD-10-CM | POA: Diagnosis not present

## 2022-03-31 DIAGNOSIS — U071 COVID-19: Secondary | ICD-10-CM | POA: Diagnosis not present

## 2022-03-31 DIAGNOSIS — I10 Essential (primary) hypertension: Secondary | ICD-10-CM

## 2022-03-31 DIAGNOSIS — R69 Illness, unspecified: Secondary | ICD-10-CM | POA: Diagnosis not present

## 2022-03-31 MED ORDER — NIRMATRELVIR/RITONAVIR (PAXLOVID)TABLET: 3.0000 | ORAL_TABLET | Freq: Two times a day (BID) | ORAL | 0 refills | Status: AC

## 2022-03-31 NOTE — Progress Notes (Addendum)
Location:  [place] Room Number: NO/28/A Place of Service:  SNF (31) Provider:  Durenda Age, DNP, FNP-BC  Patient Care Team: Yvonna Alanis, NP as PCP - General (Adult Health Nurse Practitioner) [name], [facility] (Inactive) as Pharmacist (Pharmacist) Virgie Dad, MD as Consulting Physician (Internal Medicine)  Extended Emergency Contact Information Primary Emergency Contact: Babiarz,Geraldine Address: [address]          [place] [address] Johnnette Litter of [place] Phone: [phone] Relation: Spouse Secondary Emergency Contact: Tennis Must Mobile Phone: [phone] Relation: Daughter  Code Status:  DNR  Goals of care: Advanced Directive information    03/04/2022   11:38 AM  Advanced Directives  Does Patient Have a Medical Advance Directive? Yes  Type of Paramedic of [place];Living will;Out of facility DNR (pink MOST or yellow form)  Does patient want to make changes to medical advance directive? No - Patient declined  Copy of [place] in Chart? Yes - validated most recent copy scanned in chart (See row information)     Chief Complaint  Patient presents with   Acute Visit    Testing positive for COVID-19    HPI:  Pt is a 89 y.o. male seen today for an acute visit for testing positive for COVID-19 test. He is a long-term care resident of [facility]. He has a PMH of hypertension, CAD, CHF and prostate cancer. He was tested today for COVID-19 for contact tracing. He was exposed to a staff who had COVID-19. He was seen in the room today. He was sitting on his wheelchair and was attempting to get out. His wife, also,  tested positive for COVID-19 today. He denies symptoms such as chills, body aches, nor shortness of breath. No coughing nor sore throat.  BP 133/62, takes Metoprolol tartrate for hypertension.  Son called facility staff and was wondering if staff  can bring resident to his wife's room for short visit since both tested positive for COVID-19 today.  Past Medical History:  Diagnosis Date   Complication of anesthesia    "serious cognisance problems since my OR in January" (03/13/2013   Coronary artery disease    Diverticulosis    Hiatal hernia    Hx of echocardiogram    Echo 5/16:  Mild focal basal septal hypertrophy, EF 55%, mild AI, MV repair ok with trivial MR (mean 3 mmHg), mild LAE, mild RAE, PASP 35 mmHg   Hypertension    Hypothyroidism    Irritable bowel syndrome    PAF (paroxysmal atrial fibrillation) ([place]) 05/03/2014   Personal history of colonic polyps    tubular adenoma   Pleural effusion, bilateral 03/12/2013   Small to moderate, L>R   Prostate cancer [facility]) 1991   sees Dr. Carlota Raspberry at [facility], observation only    S/P CABG x 2 02/28/2013   LIMA to LAD, SVG to OM1, EVH via right thigh   S/P Maze operation for atrial fibrillation 02/28/2013   Complete bilateral atrial lesion set using bipolar radiofrequency and cryothermy ablation with clipping of LA appendage   S/P mitral valve repair, maze procedure, and CABG x2 02/28/2013   Complex valvuloplasty including triangular resection of posterior leaflet, 30 mm Sorin Memo 3D ring annuloplasty   Past Surgical History:  Procedure Laterality Date   CARDIAC CATHETERIZATION  2015   CATARACT EXTRACTION, BILATERAL Bilateral 2014   per Dr. Gershon Crane    COLONOSCOPY  2011   per Dr.  Sharlett Iles, benign polyps, no repeats needed    CORONARY ARTERY BYPASS GRAFT N/A 02/28/2013   Procedure: CORONARY ARTERY BYPASS GRAFTING (CABG);  Surgeon: Rexene Alberts, MD;  Location: [place];  Service: Open Heart Surgery;  Laterality: N/A;  CABG x 2,  using left internal mammary artery and right leg saphenous vein harvested endoscopically   ESOPHAGOGASTRODUODENOSCOPY (EGD) WITH PROPOFOL N/A 01/11/2019   Procedure: ESOPHAGOGASTRODUODENOSCOPY (EGD) WITH PROPOFOL;  Surgeon: Doran Stabler, MD;  [facility];  Service: Gastroenterology;  Laterality: N/A;   HEMORRHOID SURGERY     INTRAOPERATIVE TRANSESOPHAGEAL ECHOCARDIOGRAM N/A 02/28/2013   Procedure: INTRAOPERATIVE TRANSESOPHAGEAL ECHOCARDIOGRAM;  Surgeon: Rexene Alberts, MD;  Location: [place];  Service: Open Heart Surgery;  Laterality: N/A;   LEFT HEART CATHETERIZATION WITH CORONARY ANGIOGRAM N/A 02/26/2013   Procedure: LEFT HEART CATHETERIZATION WITH CORONARY ANGIOGRAM;  Surgeon: Leonie Man, MD;  Location: [facility] CATH LAB;  Service: Cardiovascular;  Laterality: N/A;   MAZE N/A 02/28/2013   Procedure: MAZE;  Surgeon: Rexene Alberts, MD;  Location: [place];  Service: Open Heart Surgery;  Laterality: N/A;   MITRAL VALVE REPAIR N/A 02/28/2013   Procedure: MITRAL VALVE REPAIR (MVR);  Surgeon: Rexene Alberts, MD;  Location: [place];  Service: Open Heart Surgery;  Laterality: N/A;   PROSTATE BIOPSY     TRANSURETHRAL RESECTION OF PROSTATE N/A 02/04/2017   Procedure: TRANSURETHRAL RESECTION OF THE PROSTATE (TURP);  Surgeon: Alexis Frock, MD;  [facility];  Service: Urology;  Laterality: N/A;    Allergies  Allergen Reactions   Other Other (See Comments)    ANESTHESIA.... 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 ANESTHESIA.... 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 Occurred post op CABG 2015, required readmission and rehab    Outpatient Encounter Medications as of 03/31/2022  Medication Sig   acetaminophen (TYLENOL) 500 MG tablet Take 2 tablets (1,000 mg total) by mouth 2 (two) times daily as needed.   atorvastatin (LIPITOR) 10 MG tablet TAKE 1 TABLET EVERY DAY AT 6PM   Cholecalciferol (VITAMIN D3 PO) Take 2,000 Units by mouth daily.   Cyanocobalamin 2500 MCG SUBL Place 1 tablet under the tongue daily.   ferrous sulfate 325 (65 FE) MG EC tablet TAKE 1 TABLET BY MOUTH EVERY DAY   fish oil-omega-3 fatty acids 1000 MG capsule Take 1 g by mouth every morning.   levothyroxine (SYNTHROID) 50 MCG tablet TAKE 1  TABLET BY MOUTH IN THE MORNING   metoprolol tartrate (LOPRESSOR) 25 MG tablet TAKE ONE TABLET BY MOUTH TWICE A DAY   nirmatrelvir/ritonavir (PAXLOVID) 20 x 150 MG & 10 x '100MG'$  TABS Take 3 tablets by mouth 2 (two) times daily for 5 days. (Take nirmatrelvir 150 mg two tablets twice daily for 5 days and ritonavir 100 mg one tablet twice daily for 5 days) Patient GFR is  64 (12/10/21)   nitroGLYCERIN (NITROSTAT) 0.4 MG SL tablet Place 1 tablet (0.4 mg total) under the tongue every 5 (five) minutes as needed for chest pain.   zinc oxide 20 % ointment Apply 1 Application topically as needed for irritation.   No facility-administered encounter medications on file as of 03/31/2022.    Review of Systems   Unable to obtain due to dementia.    Immunization History  Administered Date(s) Administered   Fluad Quad(high Dose 65+) 11/21/2018, 12/25/2019, 01/02/2021, 12/16/2021   Influenza Split 11/01/2011   Influenza Whole 12/15/2006, 12/06/2007, 12/31/2009   Influenza, High Dose Seasonal PF  11/11/2014, 11/12/2015, 11/15/2016, 11/18/2017, 11/21/2018, 01/02/2021   Influenza,inj,Quad PF,6+ Mos 11/01/2012, 11/09/2013   Influenza-Unspecified 11/01/2011, 11/01/2012, 11/09/2013   Moderna SARS-COV2 Booster Vaccination 01/07/2020   Moderna Sars-Covid-2 Vaccination 02/26/2019, 03/26/2019   PFIZER(Purple Top)SARS-COV-2 Vaccination 12/29/2021   Pneumococcal Conjugate-13 11/09/2013   Pneumococcal Polysaccharide-23 02/23/2004   Td 02/22/2002   Tdap 01/22/2014   Pertinent  Health Maintenance Due  Topic Date Due   INFLUENZA VACCINE  Completed   COLONOSCOPY (Pts 45-25yr Insurance coverage will need to be confirmed)  Discontinued      12/02/2021    2:35 PM 12/10/2021   11:45 AM 12/18/2021   10:15 AM 01/25/2022    1:55 PM 02/05/2022    9:54 AM  Fall Risk  Falls in the past year? '1 1 1 1 1  '$ Was there an injury with Fall? 0 1 0 0 0  Fall Risk Category Calculator '2 3 2 2 2  '$ Fall Risk Category (Retired)  Moderate High Moderate Moderate Moderate  (RETIRED) Patient Fall Risk Level Moderate fall risk Moderate fall risk Moderate fall risk Moderate fall risk Moderate fall risk  Patient at Risk for Falls Due to History of fall(s) History of fall(s) History of fall(s);Impaired balance/gait;Impaired mobility History of fall(s);Impaired balance/gait;Impaired mobility History of fall(s);Impaired balance/gait;Impaired mobility  Fall risk Follow up Falls evaluation completed;Falls prevention discussed Falls evaluation completed Falls evaluation completed;Education provided;Falls prevention discussed Falls evaluation completed Falls evaluation completed     Vitals:   03/31/22 1354  BP: 133/62  Pulse: 76  Resp: 18  Temp: (!) 97.1 F (36.2 C)  SpO2: 96%  Weight: 145 lb 9.6 oz (66 kg)  Height: '5\' 9"'$  (1.753 m)   Body mass index is 21.5 kg/m.  Physical Exam Constitutional:      General: He is not in acute distress.    Appearance: Normal appearance.  HENT:     Head: Normocephalic and atraumatic.     Mouth/Throat:     Mouth: Mucous membranes are moist.  Eyes:     Conjunctiva/sclera: Conjunctivae normal.  Cardiovascular:     Rate and Rhythm: Normal rate and regular rhythm.     Pulses: Normal pulses.     Heart sounds: Normal heart sounds.  Pulmonary:     Effort: Pulmonary effort is normal.     Breath sounds: Normal breath sounds.  Abdominal:     General: Bowel sounds are normal.     Palpations: Abdomen is soft.  Musculoskeletal:        General: No swelling. Normal range of motion.     Cervical back: Normal range of motion.  Skin:    General: Skin is warm and dry.  Neurological:     Mental Status: Mental status is at baseline.     Comments: Alert to self, disoriented to time and place.  Psychiatric:        Mood and Affect: Mood normal.        Behavior: Behavior normal.       Labs reviewed: Recent Labs    07/28/21 1025 02/18/22 0000  NA 141 136*  K 4.0 4.6  CL 109 104  CO2 29  24*  GLUCOSE 84  --   BUN 24* 23*  CREATININE 1.16 1.0  CALCIUM 9.1 8.5*   Recent Labs    07/28/21 1025  AST 15  ALT 11  ALKPHOS 49  BILITOT 0.5  PROT 6.1*  ALBUMIN 3.4*   Recent Labs    07/28/21 1025 02/18/22 0000  WBC 5.0 6.9  NEUTROABS  3.9  --   HGB 11.3* 10.7*  HCT 34.3* 32*  MCV 93.5  --   PLT 140* 182   Lab Results  Component Value Date   TSH 2.42 01/28/2021   Lab Results  Component Value Date   HGBA1C 5.6 07/01/2016   Lab Results  Component Value Date   CHOL 121 01/28/2021   HDL 51.10 01/28/2021   LDLCALC 55 01/28/2021   LDLDIRECT 159.3 05/17/2007   TRIG 76.0 01/28/2021   CHOLHDL 2 01/28/2021    Significant Diagnostic Results in last 30 days:  No results found.  Assessment/Plan  1. COVID-19 virus infection -  will start on Paxlovid -  continue Vitamin D3 for supplementation -  will put on quarantine X 10 days -  staff may bring resident to wife's room for short visit since they both tested positive - nirmatrelvir/ritonavir (PAXLOVID) 20 x 150 MG & 10 x '100MG'$  TABS; Take 3 tablets by mouth 2 (two) times daily for 5 days. (Take nirmatrelvir 150 mg two tablets twice daily for 5 days and ritonavir 100 mg one tablet twice daily for 5 days) Patient GFR is  64 (12/10/21)  Dispense: 30 tablet; Refill: 0 -  discussed treatment with son and daughter  2. Moderate vascular dementia without behavioral disturbance, psychotic disturbance, mood disturbance, or anxiety (HCC) -  BIMS score 8/15 (03/25/22), ranging in moderate cognitive impairment -  staff to encourage resident to use telephone in communicating with wife since both will be on quarantine X 10 days  3. Essential hypertension -  BP  133/62, stable -  continue Metoprolol tartrate    Family/ staff Communication: Discussed plan of care with resident, son, daughter and charge nurse.  Labs/tests ordered:  COVID-19 test    Durenda Age, DNP, MSN, FNP-BC [place] [phone] (Monday-Friday 8:00 a.m. - 5:00 p.m.) [phone] (after hours)

## 2022-04-19 ENCOUNTER — Non-Acute Institutional Stay (SKILLED_NURSING_FACILITY): Payer: Medicare HMO | Admitting: Orthopedic Surgery

## 2022-04-19 ENCOUNTER — Encounter: Payer: Self-pay | Admitting: Orthopedic Surgery

## 2022-04-19 DIAGNOSIS — F01B0 Vascular dementia, moderate, without behavioral disturbance, psychotic disturbance, mood disturbance, and anxiety: Secondary | ICD-10-CM

## 2022-04-19 DIAGNOSIS — H6123 Impacted cerumen, bilateral: Secondary | ICD-10-CM | POA: Diagnosis not present

## 2022-04-19 DIAGNOSIS — I2510 Atherosclerotic heart disease of native coronary artery without angina pectoris: Secondary | ICD-10-CM | POA: Diagnosis not present

## 2022-04-19 DIAGNOSIS — N368 Other specified disorders of urethra: Secondary | ICD-10-CM | POA: Diagnosis not present

## 2022-04-19 DIAGNOSIS — E039 Hypothyroidism, unspecified: Secondary | ICD-10-CM | POA: Diagnosis not present

## 2022-04-19 DIAGNOSIS — I5032 Chronic diastolic (congestive) heart failure: Secondary | ICD-10-CM

## 2022-04-19 DIAGNOSIS — C61 Malignant neoplasm of prostate: Secondary | ICD-10-CM

## 2022-04-19 DIAGNOSIS — I10 Essential (primary) hypertension: Secondary | ICD-10-CM | POA: Diagnosis not present

## 2022-04-19 DIAGNOSIS — D509 Iron deficiency anemia, unspecified: Secondary | ICD-10-CM | POA: Diagnosis not present

## 2022-04-19 DIAGNOSIS — R69 Illness, unspecified: Secondary | ICD-10-CM | POA: Diagnosis not present

## 2022-04-19 NOTE — Progress Notes (Signed)
Location:   [facility] Room Number: 28-A Place of Service:  SNF (31) Provider: Windell Moulding, NP     Patient Care Team: Yvonna Alanis, NP as PCP - General (Adult Health Nurse Practitioner) [name], [facility] (Inactive) as Pharmacist (Pharmacist) Virgie Dad, MD as Consulting Physician (Internal Medicine)  Extended Emergency Contact Information Primary Emergency Contact: Schinke,Geraldine Address: [address]          [place] [address] Johnnette Litter of [place] Phone: [phone] Relation: Spouse Secondary Emergency Contact: Tennis Must Mobile Phone: [phone] Relation: Daughter  Code Status:  DNR Goals of care: Advanced Directive information    04/19/2022   10:05 AM  Advanced Directives  Does Patient Have a Medical Advance Directive? Yes  Type of Paramedic of [place];Out of facility DNR (pink MOST or yellow form)  Does patient want to make changes to medical advance directive? No - Patient declined  Copy of [place] in Chart? Yes - validated most recent copy scanned in chart (See row information)     Chief Complaint  Patient presents with   Medical Management of Chronic Issues    Routine Visit.   Immunizations    Discuss the need for Shingrix vaccine, and Covid Booster.    HPI:  Pt is a 89 y.o. male seen today for medical management of chronic diseases.    PMH: CAD with CABG x 2, HTN, HLD, CHF, mitral valve repair 2015, prostate cancer, TURP 2018, left ureteroscopy with left stent placement 10/2021, Diverticulosis, GERD, GI bleed, hypothyroidism, osteopenia, and falls.    Prostate cancer- followed by Dr. Burnell Blanks, diagnosed 2012, TURP 2019, cancer now affecting left urethra- new stent placed 03/03/2022, intermittent hematuria per patient, remains on Lupron injections Q3 months Vascular dementia/cognitive decline- 02/2021 MRI brain indicated small chronic cortical infarcts within  left frontal/parietal lobes,moderate cerebral white matter chronic small vessel ischemic disease also noted, BIMS score 11/15 (10/24), wander guard placed on w/c, no behaviors, not on medication CHF- 2019 LVEF 55-60%, not on diuretics HTN- BUN/creat 23/1.0 02/18/2022, remains on metoprolol CAD- LDL 55 12/11/2021, unable to tolerate aspirin due to past GI bleed, remains on Lipitor Hypothyroidism- TSH 3.72 12/11/2021 remains on levothyroxine Iron deficiency anemia- Hgb 10.7 02/18/2022, remains on ferrous sulfate/ B12  Recent blood pressures:  02/20- 116/56  02/19- 104/58  02/10- 109/55  Recent weights:  02/01- not recorded  01/09- 145.6 lbs  11/08- 146.8 lbs    Past Medical History:  Diagnosis Date   Complication of anesthesia    "serious cognisance problems since my OR in January" (03/13/2013   Coronary artery disease    Diverticulosis    Hiatal hernia    Hx of echocardiogram    Echo 5/16:  Mild focal basal septal hypertrophy, EF 55%, mild AI, MV repair ok with trivial MR (mean 3 mmHg), mild LAE, mild RAE, PASP 35 mmHg   Hypertension    Hypothyroidism    Irritable bowel syndrome    PAF (paroxysmal atrial fibrillation) ([place]) 05/03/2014   Personal history of colonic polyps    tubular adenoma   Pleural effusion, bilateral 03/12/2013   Small to moderate, L>R   Prostate cancer [facility]) 1991   sees Dr. Carlota Raspberry at [facility], observation only    S/P CABG x 2 02/28/2013   LIMA to LAD, SVG to OM1, EVH via right thigh   S/P Maze operation for atrial fibrillation 02/28/2013   Complete bilateral atrial lesion set using  bipolar radiofrequency and cryothermy ablation with clipping of LA appendage   S/P mitral valve repair, maze procedure, and CABG x2 02/28/2013   Complex valvuloplasty including triangular resection of posterior leaflet, 30 mm Sorin Memo 3D ring annuloplasty   Past Surgical History:  Procedure Laterality Date   CARDIAC CATHETERIZATION  2015   CATARACT EXTRACTION, BILATERAL Bilateral  2014   per Dr. Gershon Crane    COLONOSCOPY  2011   per Dr. Sharlett Iles, benign polyps, no repeats needed    CORONARY ARTERY BYPASS GRAFT N/A 02/28/2013   Procedure: CORONARY ARTERY BYPASS GRAFTING (CABG);  Surgeon: Rexene Alberts, MD;  Location: [facility];  Service: Open Heart Surgery;  Laterality: N/A;  CABG x 2,  using left internal mammary artery and right leg saphenous vein harvested endoscopically   ESOPHAGOGASTRODUODENOSCOPY (EGD) WITH PROPOFOL N/A 01/11/2019   Procedure: ESOPHAGOGASTRODUODENOSCOPY (EGD) WITH PROPOFOL;  Surgeon: Doran Stabler, MD;  [facility];  Service: Gastroenterology;  Laterality: N/A;   HEMORRHOID SURGERY     INTRAOPERATIVE TRANSESOPHAGEAL ECHOCARDIOGRAM N/A 02/28/2013   Procedure: INTRAOPERATIVE TRANSESOPHAGEAL ECHOCARDIOGRAM;  Surgeon: Rexene Alberts, MD;  Location: [facility];  Service: Open Heart Surgery;  Laterality: N/A;   LEFT HEART CATHETERIZATION WITH CORONARY ANGIOGRAM N/A 02/26/2013   Procedure: LEFT HEART CATHETERIZATION WITH CORONARY ANGIOGRAM;  Surgeon: Leonie Man, MD;  Location: [facility] CATH LAB;  Service: Cardiovascular;  Laterality: N/A;   MAZE N/A 02/28/2013   Procedure: MAZE;  Surgeon: Rexene Alberts, MD;  Location: [facility];  Service: Open Heart Surgery;  Laterality: N/A;   MITRAL VALVE REPAIR N/A 02/28/2013   Procedure: MITRAL VALVE REPAIR (MVR);  Surgeon: Rexene Alberts, MD;  Location: [place];  Service: Open Heart Surgery;  Laterality: N/A;   PROSTATE BIOPSY     TRANSURETHRAL RESECTION OF PROSTATE N/A 02/04/2017   Procedure: TRANSURETHRAL RESECTION OF THE PROSTATE (TURP);  Surgeon: Alexis Frock, MD;  [facility]: [facility];  Service: Urology;  Laterality: N/A;    Allergies  Allergen Reactions   Other Other (See Comments)    ANESTHESIA.... 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 ANESTHESIA.... 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 Occurred post op CABG 2015, required readmission and rehab    Allergies as of  04/19/2022       Reactions   Other Other (See Comments)   ANESTHESIA.... 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 ANESTHESIA.... 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 Occurred post op CABG 2015, required readmission and rehab        Medication List        Accurate as of April 19, 2022 10:14 AM. If you have any questions, ask your nurse or doctor.          acetaminophen 500 MG tablet Commonly known as: TYLENOL Take 2 tablets (1,000 mg total) by mouth 2 (two) times daily as needed.   atorvastatin 10 MG tablet Commonly known as: LIPITOR TAKE 1 TABLET EVERY DAY AT 6PM   Cyanocobalamin 2500 MCG Subl Place 1 tablet under the tongue daily.   ferrous sulfate 325 (65 FE) MG EC tablet TAKE 1 TABLET BY MOUTH EVERY DAY   fish oil-omega-3 fatty acids 1000 MG capsule Take 1 g by mouth every morning.   levothyroxine 50 MCG tablet Commonly known as: SYNTHROID TAKE 1 TABLET BY MOUTH IN THE MORNING   metoprolol tartrate 25 MG tablet Commonly known as: LOPRESSOR TAKE ONE TABLET BY MOUTH TWICE A DAY  nitroGLYCERIN 0.4 MG SL tablet Commonly known as: NITROSTAT Place 1 tablet (0.4 mg total) under the tongue every 5 (five) minutes as needed for chest pain.   VITAMIN D3 PO Take 2,000 Units by mouth daily.   zinc oxide 20 % ointment Apply 1 Application topically as needed for irritation.        Review of Systems  Unable to perform ROS: Dementia    Immunization History  Administered Date(s) Administered   Fluad Quad(high Dose 65+) 11/21/2018, 12/25/2019, 01/02/2021, 12/16/2021   Influenza Split 11/01/2011   Influenza Whole 12/15/2006, 12/06/2007, 12/31/2009   Influenza, High Dose Seasonal PF 11/11/2014, 11/12/2015, 11/15/2016, 11/18/2017, 11/21/2018, 01/02/2021   Influenza,inj,Quad PF,6+ Mos 11/01/2012, 11/09/2013   Influenza-Unspecified 11/01/2011, 11/01/2012, 11/09/2013   Moderna SARS-COV2 Booster Vaccination 01/07/2020   Moderna  Sars-Covid-2 Vaccination 02/26/2019, 03/26/2019   PFIZER(Purple Top)SARS-COV-2 Vaccination 12/29/2021   Pneumococcal Conjugate-13 11/09/2013   Pneumococcal Polysaccharide-23 02/23/2004   Td 02/22/2002   Tdap 01/22/2014   Pertinent  Health Maintenance Due  Topic Date Due   INFLUENZA VACCINE  Completed   COLONOSCOPY (Pts 45-84yr Insurance coverage will need to be confirmed)  Discontinued      12/02/2021    2:35 PM 12/10/2021   11:45 AM 12/18/2021   10:15 AM 01/25/2022    1:55 PM 02/05/2022    9:54 AM  Fall Risk  Falls in the past year? '1 1 1 1 1  '$ Was there an injury with Fall? 0 1 0 0 0  Fall Risk Category Calculator '2 3 2 2 2  '$ Fall Risk Category (Retired) Moderate High Moderate Moderate Moderate  (RETIRED) Patient Fall Risk Level Moderate fall risk Moderate fall risk Moderate fall risk Moderate fall risk Moderate fall risk  Patient at Risk for Falls Due to History of fall(s) History of fall(s) History of fall(s);Impaired balance/gait;Impaired mobility History of fall(s);Impaired balance/gait;Impaired mobility History of fall(s);Impaired balance/gait;Impaired mobility  Fall risk Follow up Falls evaluation completed;Falls prevention discussed Falls evaluation completed Falls evaluation completed;Education provided;Falls prevention discussed Falls evaluation completed Falls evaluation completed   Functional Status Survey:    Vitals:   04/19/22 1003  BP: (!) 116/56  Pulse: 69  Resp: 18  Temp: 97.9 F (36.6 C)  SpO2: 99%  Weight: 145 lb 9.6 oz (66 kg)  Height: '5\' 9"'$  (1.753 m)   Body mass index is 21.5 kg/m. Physical Exam Vitals reviewed.  Constitutional:      General: He is not in acute distress. HENT:     Head: Normocephalic.     Right Ear: There is impacted cerumen.     Left Ear: There is impacted cerumen.     Nose: Nose normal.     Mouth/Throat:     Mouth: Mucous membranes are moist.  Eyes:     General:        Right eye: No discharge.        Left eye: No  discharge.  Cardiovascular:     Rate and Rhythm: Normal rate and regular rhythm.     Pulses: Normal pulses.     Heart sounds: Normal heart sounds.  Pulmonary:     Effort: Pulmonary effort is normal. No respiratory distress.     Breath sounds: Normal breath sounds. No wheezing.  Abdominal:     General: Bowel sounds are normal. There is no distension.     Palpations: Abdomen is soft.     Tenderness: There is no abdominal tenderness.  Musculoskeletal:     Cervical back: Neck supple.  Right lower leg: No edema.     Left lower leg: No edema.  Skin:    General: Skin is warm.     Capillary Refill: Capillary refill takes less than 2 seconds.  Neurological:     General: No focal deficit present.     Mental Status: He is alert. Mental status is at baseline.     Motor: Weakness present.     Gait: Gait abnormal.  Psychiatric:        Mood and Affect: Mood normal.        Behavior: Behavior normal.     Labs reviewed: Recent Labs    07/28/21 1025 12/11/21 0000 02/18/22 0000  NA 141 137 136*  K 4.0 4.2 4.6  CL 109 103 104  CO2 29 28* 24*  GLUCOSE 84  --   --   BUN 24* 21 23*  CREATININE 1.16 1.1 1.0  CALCIUM 9.1 8.7 8.5*   Recent Labs    07/28/21 1025 12/11/21 0000  AST 15 17  ALT 11 16  ALKPHOS 49 68  BILITOT 0.5  --   PROT 6.1*  --   ALBUMIN 3.4* 3.4*   Recent Labs    07/28/21 1025 12/11/21 0000 02/18/22 0000  WBC 5.0 5.1 6.9  NEUTROABS 3.9 3,738.00  --   HGB 11.3* 12.1* 10.7*  HCT 34.3* 36* 32*  MCV 93.5  --   --   PLT 140* 239 182   Lab Results  Component Value Date   TSH 2.42 01/28/2021   Lab Results  Component Value Date   HGBA1C 5.6 07/01/2016   Lab Results  Component Value Date   CHOL 122 12/11/2021   HDL 51 12/11/2021   LDLCALC 55 12/11/2021   LDLDIRECT 159.3 05/17/2007   TRIG 75 12/11/2021   CHOLHDL 2 01/28/2021    Significant Diagnostic Results in last 30 days:  No results found.  Assessment/Plan 1. Bilateral impacted cerumen -  unable to visualize TM - start debrox- 5 gtts- apply to both ears BID x 5 days - flush ears with warm water when debrox complete  2. ADENOCARCINOMA, PROSTATE, GLEASON GRADE 3 - followed by Dr. Burnell Blanks - cont Lupron injections  3. Urethral obstruction - left urethral stent placed 03/03/2022 - intermittent hematuria  4. Moderate vascular dementia without behavioral disturbance, psychotic disturbance, mood disturbance, or anxiety (HCC) - no behaviors - ambulates with w/c - dependent with some ADLs - cont skilled nursing  5. Chronic diastolic CHF (congestive heart failure) (HCC) - compensated  6. Essential hypertension - controlled, goal < 150/90 - cont metoprolol  7. Coronary artery disease involving native coronary artery of native heart without angina pectoris - cont Lipitor and nitroglycerin prn  8. Acquired hypothyroidism - TSH stable - cont levothyroxine  9. Iron deficiency anemia, unspecified iron deficiency anemia type - hgb stable - cont ferrous sulfate    Family/ staff Communication: plan discussed with patient and nurse  Labs/tests ordered:  none

## 2022-04-22 DIAGNOSIS — C61 Malignant neoplasm of prostate: Secondary | ICD-10-CM | POA: Diagnosis not present

## 2022-05-03 NOTE — Progress Notes (Unsigned)
Care Management & Coordination Services Pharmacy Note  05/03/2022 Name:  Mahan Gubbels. MRN:  [id number] DOB:  [birth year]  Summary: ***  Recommendations/Changes made from today's visit: ***  Follow up plan: ***   Subjective: [name]. is an 89 y.o. year old male who is a primary patient of [place], [place], NP.  The care coordination team was consulted for assistance with disease management and care coordination needs.    {CCMTELEPHONEFACETOFACE:21091510} for {CCMINITIALFOLLOWUPCHOICE:21091511}.  Recent office visits: ***  Recent consult visits: ***  Hospital visits: {Hospital DC Yes/No:25215}   Objective:  Lab Results  Component Value Date   CREATININE 1.0 02/18/2022   BUN 23 (A) 02/18/2022   GFR 61.42 01/28/2021   EGFR 69 02/18/2022   GFRNONAA 59 (L) 07/28/2021   GFRAA 70 01/03/2020   NA 136 (A) 02/18/2022   K 4.6 02/18/2022   CALCIUM 8.5 (A) 02/18/2022   CO2 24 (A) 02/18/2022   GLUCOSE 84 07/28/2021    Lab Results  Component Value Date/Time   HGBA1C 5.6 07/01/2016 01:40 PM   HGBA1C 5.6 01/27/2016 11:27 AM   GFR 61.42 01/28/2021 02:54 PM   GFR 53.16 (L) 06/16/2020 11:06 AM    Last diabetic Eye exam: No results found for: "HMDIABEYEEXA"  Last diabetic Foot exam: No results found for: "HMDIABFOOTEX"   Lab Results  Component Value Date   CHOL 122 12/11/2021   HDL 51 12/11/2021   LDLCALC 55 12/11/2021   LDLDIRECT 159.3 05/17/2007   TRIG 75 12/11/2021   CHOLHDL 2 01/28/2021       Latest Ref Rng & Units 12/11/2021   12:00 AM 07/28/2021   10:25 AM 02/03/2021    9:50 AM  Hepatic Function  Total Protein 6.5 - 8.1 g/dL  6.1  6.3   Albumin 3.5 - 5.0 3.4     3.4  3.2   AST 14 - 40 '17     15  19   '$ ALT 10 - 40 U/L '16     11  21   '$ Alk Phosphatase 25 - 125 68     49  59   Total Bilirubin 0.3 - 1.2 mg/dL  0.5  0.7      This result is from an external source.    Lab Results  Component Value Date/Time   TSH 2.42 01/28/2021 02:54 PM   TSH  4.37 06/16/2020 11:06 AM   FREET4 0.93 06/16/2020 11:06 AM   FREET4 1.2 01/03/2020 11:49 AM       Latest Ref Rng & Units 02/18/2022   12:00 AM 12/11/2021   12:00 AM 07/28/2021   10:25 AM  CBC  WBC  6.9     5.1     5.0   Hemoglobin 13.5 - 17.5 10.7     12.1     11.3   Hematocrit 41 - 53 32     36     34.3   Platelets 150 - 400 K/uL 182     239     140      This result is from an external source.    Lab Results  Component Value Date/Time   VD25OH 39 01/03/2020 11:49 AM   VD25OH 39 08/02/2007 10:41 AM   VITAMINB12 548 01/03/2020 11:49 AM   VITAMINB12 939 (H) 01/10/2019 08:01 AM    Clinical ASCVD: {YES/NO:21197} The ASCVD Risk score (Arnett DK, et al., 2019) failed to calculate for the following reasons:   The 2019 ASCVD risk score  is only valid for ages 85 to 66   The patient has a prior MI or stroke diagnosis    ***Other: (CHADS2VASc if Afib, MMRC or CAT for COPD, ACT, DEXA)     02/17/2022    9:30 AM 02/05/2022    9:54 AM 01/25/2022    1:33 PM  Depression screen PHQ 2/9  Decreased Interest 0 0 0  Down, Depressed, Hopeless 0 0 0  PHQ - 2 Score 0 0 0     Social History   Tobacco Use  Smoking Status Never  Smokeless Tobacco Never   BP Readings from Last 3 Encounters:  04/19/22 (!) 116/56  03/31/22 133/62  03/04/22 112/76   Pulse Readings from Last 3 Encounters:  04/19/22 69  03/31/22 76  03/04/22 62   Wt Readings from Last 3 Encounters:  04/19/22 145 lb 9.6 oz (66 kg)  03/31/22 145 lb 9.6 oz (66 kg)  03/04/22 145 lb 9.6 oz (66 kg)   BMI Readings from Last 3 Encounters:  04/19/22 21.50 kg/m  03/31/22 21.50 kg/m  03/04/22 21.50 kg/m    Allergies  Allergen Reactions   Other Other (See Comments)    ANESTHESIA.... 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 ANESTHESIA.... 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 Occurred post op CABG 2015, required readmission and rehab    Medications Reviewed Today     Reviewed by  Otis Peak, CMA (Certified Medical Assistant) on 04/19/22 at 1013  Med List Status: <None>   Medication Order Taking? Sig Documenting Provider Last Dose Status Informant  acetaminophen (TYLENOL) 500 MG tablet [id number] Yes Take 2 tablets (1,000 mg total) by mouth 2 (two) times daily as needed. Yvonna Alanis, NP Taking Active   atorvastatin (LIPITOR) 10 MG tablet [id number] Yes TAKE 1 TABLET EVERY DAY AT Darnelle Maffucci, MD Taking Active   Cholecalciferol (VITAMIN D3 PO) [id number] Yes Take 2,000 Units by mouth daily. [provider] Taking Active Self  Cyanocobalamin 2500 MCG SUBL [id number] Yes Place 1 tablet under the tongue daily. [provider] Taking Active   ferrous sulfate 325 (65 FE) MG EC tablet [id number] Yes TAKE 1 TABLET BY MOUTH EVERY DAY [name], MD Taking Active   fish oil-omega-3 fatty acids 1000 MG capsule [id number] Yes Take 1 g by mouth every morning. [provider] Taking Active Self           Med Note Jimmey Ralph, Frederik Schmidt Apr 23, 2018  3:24 PM)    levothyroxine (SYNTHROID) 50 MCG tablet [id number] Yes TAKE 1 TABLET BY MOUTH IN THE MORNING [name], MD Taking Active   metoprolol tartrate (LOPRESSOR) 25 MG tablet [id number] Yes TAKE ONE TABLET BY MOUTH TWICE A DAY [name], MD Taking Active   nitroGLYCERIN (NITROSTAT) 0.4 MG SL tablet [id number] Yes Place 1 tablet (0.4 mg total) under the tongue every 5 (five) minutes as needed for chest pain. Fay Records, MD Taking Active            Med Note Garnet Koyanagi   Mon Jan 25, 2022  1:45 PM)    zinc oxide 20 % ointment [id number] Yes Apply 1 Application topically as needed for irritation. [provider] Taking Active             SDOH:  (Social Determinants of Health) assessments and interventions performed: {yes/no:20286} SDOH Interventions    Flowsheet [place] from [facility]/18/2021 in [facility]  [place] [facility]  Management from 11/04/2020 in [facility] Interventions Intervention Not Indicated --  Housing Interventions Intervention Not Indicated --  Transportation Interventions -- Intervention Not Indicated  Alcohol Usage Interventions Intervention Not Indicated (Score <7) --  Financial Strain Interventions Intervention Not Indicated Intervention Not Indicated  Physical Activity Interventions Intervention Not Indicated --  Stress Interventions Intervention Not Indicated --  Social Connections Interventions Intervention Not Indicated --       Medication Assistance: {MEDASSISTANCEINFO:25044}  Medication Access: Within the past 30 days, how often has patient missed a dose of medication? *** Is a pillbox or other method used to improve adherence? {YES/NO:21197} Factors that may affect medication adherence? {CHL DESC; BARRIERS:21522} Are meds synced by current pharmacy? {YES/NO:21197} Are meds delivered by current pharmacy? {YES/NO:21197} Does patient experience delays in picking up medications due to transportation concerns? {YES/NO:21197}  [facility] Reviewed: Is patient disadvantaged to use [facility]?: {YES/NO:21197} Current Rx insurance plan: *** Name and location of Current pharmacy:  [facility] [place], [place] - [address] [place] [address] Phone: [phone] Fax: [phone]  [facility] - [place], [place] - [address] [place] [address] Phone: [phone] Fax: [phone]  [id number] [address]: [phone]: [address]Phone: [phone]:  [phone] [facility] services reviewed with patient today?: {YES/NO:21197} Patient requests to transfer care to [facility]?: {YES/NO:21197} Reason patient declined to change pharmacies: {US patient preference:27474}  Compliance/Adherence/Medication fill history: Care Gaps: ***  Star-Rating Drugs: ***   Assessment/Plan   {CCM PHARMD DISEASE STATES:25130}  ***

## 2022-05-14 ENCOUNTER — Non-Acute Institutional Stay (SKILLED_NURSING_FACILITY): Payer: Medicare HMO | Admitting: Orthopedic Surgery

## 2022-05-14 ENCOUNTER — Encounter: Payer: Self-pay | Admitting: Orthopedic Surgery

## 2022-05-14 DIAGNOSIS — F01B0 Vascular dementia, moderate, without behavioral disturbance, psychotic disturbance, mood disturbance, and anxiety: Secondary | ICD-10-CM

## 2022-05-14 DIAGNOSIS — C61 Malignant neoplasm of prostate: Secondary | ICD-10-CM | POA: Diagnosis not present

## 2022-05-14 DIAGNOSIS — R634 Abnormal weight loss: Secondary | ICD-10-CM | POA: Diagnosis not present

## 2022-05-14 DIAGNOSIS — D509 Iron deficiency anemia, unspecified: Secondary | ICD-10-CM

## 2022-05-14 DIAGNOSIS — I10 Essential (primary) hypertension: Secondary | ICD-10-CM

## 2022-05-14 DIAGNOSIS — N368 Other specified disorders of urethra: Secondary | ICD-10-CM

## 2022-05-14 DIAGNOSIS — I5032 Chronic diastolic (congestive) heart failure: Secondary | ICD-10-CM

## 2022-05-14 DIAGNOSIS — E039 Hypothyroidism, unspecified: Secondary | ICD-10-CM | POA: Diagnosis not present

## 2022-05-14 DIAGNOSIS — I2510 Atherosclerotic heart disease of native coronary artery without angina pectoris: Secondary | ICD-10-CM

## 2022-05-14 DIAGNOSIS — R69 Illness, unspecified: Secondary | ICD-10-CM | POA: Diagnosis not present

## 2022-05-14 NOTE — Progress Notes (Signed)
Location:   [place] Room Number: 28-A Place of Service:  SNF (31) Provider:  Windell Moulding, NP    Patient Care Team: Yvonna Alanis, NP as PCP - General (Adult Health Nurse Practitioner) [name], [facility] (Inactive) as Pharmacist (Pharmacist) Virgie Dad, MD as Consulting Physician (Internal Medicine)  Extended Emergency Contact Information Primary Emergency Contact: Marulanda,Geraldine Address: [address]          [place] [address] Johnnette Litter of [place] Phone: [phone] Relation: Spouse Secondary Emergency Contact: Tennis Must Mobile Phone: [phone] Relation: Daughter  Code Status:  DNR Goals of care: Advanced Directive information    05/14/2022   10:54 AM  Advanced Directives  Does Patient Have a Medical Advance Directive? Yes  Type of Paramedic of [place];Out of facility DNR (pink MOST or yellow form)  Does patient want to make changes to medical advance directive? No - Patient declined  Copy of [place] in Chart? Yes - validated most recent copy scanned in chart (See row information)     Chief Complaint  Patient presents with   Medical Management of Chronic Issues    Routine Visit.    Immunizations    Discuss the need for Shingrix vaccine, and Covid Booster.     HPI:  Pt is a 89 y.o. male seen today for medical management of chronic diseases.    PMH: CAD with CABG x 2, HTN, HLD, CHF, mitral valve repair 2015, prostate cancer, TURP 2018, left ureteroscopy with left stent placement 10/2021, Diverticulosis, GERD, GI bleed, hypothyroidism, osteopenia, and falls.   Weight loss- BMI 20.97, see trends below Prostate cancer- followed by Dr. Burnell Blanks, diagnosed 2012, TURP 2019, cancer now affecting left urethra- new stent placed 03/03/2022, remains on Lupron injections Q3 months Vascular dementia/cognitive decline- 02/2021 MRI brain indicated small chronic cortical  infarcts within left frontal/parietal lobes,moderate cerebral white matter chronic small vessel ischemic disease also noted, BIMS score 8/15 (02/01)> was 11/15 (10/24), no behaviors, not on medication CHF- 2019 LVEF 55-60%, not on diuretics HTN- BUN/creat 23/1.0 02/18/2022, remains on metoprolol CAD- LDL 55 12/11/2021, unable to tolerate aspirin due to past GI bleed, remains on Lipitor Hypothyroidism- TSH 3.72 12/11/2021 remains on levothyroxine Iron deficiency anemia- Hgb 10.7 02/18/2022, remains on ferrous sulfate/ B12  Recent blood pressures:  03/19- 123/58  03/12- 111/64  03/05- 100/64  Recent weights:  03/01- 140.3 lbs  02/01- 142 lbs  01/09- 145.6 lbs   Past Medical History:  Diagnosis Date   Complication of anesthesia    "serious cognisance problems since my OR in January" (03/13/2013   Coronary artery disease    Diverticulosis    Hiatal hernia    Hx of echocardiogram    Echo 5/16:  Mild focal basal septal hypertrophy, EF 55%, mild AI, MV repair ok with trivial MR (mean 3 mmHg), mild LAE, mild RAE, PASP 35 mmHg   Hypertension    Hypothyroidism    Irritable bowel syndrome    PAF (paroxysmal atrial fibrillation) ([place]) 05/03/2014   Personal history of colonic polyps    tubular adenoma   Pleural effusion, bilateral 03/12/2013   Small to moderate, L>R   Prostate cancer [facility]) 1991   sees Dr. Carlota Raspberry at [facility], observation only    S/P CABG x 2 02/28/2013   LIMA to LAD, SVG to OM1, [facility] via right thigh   S/P Maze operation for atrial fibrillation 02/28/2013   Complete bilateral atrial lesion  set using bipolar radiofrequency and cryothermy ablation with clipping of LA appendage   S/P mitral valve repair, maze procedure, and CABG x2 02/28/2013   Complex valvuloplasty including triangular resection of posterior leaflet, 30 mm Sorin Memo 3D ring annuloplasty   Past Surgical History:  Procedure Laterality Date   CARDIAC CATHETERIZATION  2015   CATARACT EXTRACTION, BILATERAL Bilateral  2014   per Dr. Gershon Crane    COLONOSCOPY  2011   per Dr. Sharlett Iles, benign polyps, no repeats needed    CORONARY ARTERY BYPASS GRAFT N/A 02/28/2013   Procedure: CORONARY ARTERY BYPASS GRAFTING (CABG);  Surgeon: Rexene Alberts, MD;  Location: [place];  Service: Open Heart Surgery;  Laterality: N/A;  CABG x 2,  using left internal mammary artery and right leg saphenous vein harvested endoscopically   ESOPHAGOGASTRODUODENOSCOPY (EGD) WITH PROPOFOL N/A 01/11/2019   Procedure: ESOPHAGOGASTRODUODENOSCOPY (EGD) WITH PROPOFOL;  Surgeon: Doran Stabler, MD;  [facility];  Service: Gastroenterology;  Laterality: N/A;   HEMORRHOID SURGERY     INTRAOPERATIVE TRANSESOPHAGEAL ECHOCARDIOGRAM N/A 02/28/2013   Procedure: INTRAOPERATIVE TRANSESOPHAGEAL ECHOCARDIOGRAM;  Surgeon: Rexene Alberts, MD;  Location: [place];  Service: Open Heart Surgery;  Laterality: N/A;   LEFT HEART CATHETERIZATION WITH CORONARY ANGIOGRAM N/A 02/26/2013   Procedure: LEFT HEART CATHETERIZATION WITH CORONARY ANGIOGRAM;  Surgeon: Leonie Man, MD;  [facility] LAB;  Service: Cardiovascular;  Laterality: N/A;   MAZE N/A 02/28/2013   Procedure: MAZE;  Surgeon: Rexene Alberts, MD;  Location: [place];  Service: Open Heart Surgery;  Laterality: N/A;   MITRAL VALVE REPAIR N/A 02/28/2013   Procedure: MITRAL VALVE REPAIR (MVR);  Surgeon: Rexene Alberts, MD;  Location: [place];  Service: Open Heart Surgery;  Laterality: N/A;   PROSTATE BIOPSY     TRANSURETHRAL RESECTION OF PROSTATE N/A 02/04/2017   Procedure: TRANSURETHRAL RESECTION OF THE PROSTATE (TURP);  Surgeon: Alexis Frock, MD;  [facility];  Service: Urology;  Laterality: N/A;    Allergies  Allergen Reactions   Other Other (See Comments)    ANESTHESIA.... 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 ANESTHESIA.... 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 Occurred post op CABG 2015, required readmission and rehab    Allergies as of  05/14/2022       Reactions   Other Other (See Comments)   ANESTHESIA.... 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 ANESTHESIA.... 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 Occurred post op CABG 2015, required readmission and rehab        Medication List        Accurate as of May 14, 2022 10:54 AM. If you have any questions, ask your nurse or doctor.          acetaminophen 500 MG tablet Commonly known as: TYLENOL Take 2 tablets (1,000 mg total) by mouth 2 (two) times daily as needed.   atorvastatin 10 MG tablet Commonly known as: LIPITOR TAKE 1 TABLET EVERY DAY AT 6PM   Cyanocobalamin 2500 MCG Subl Place 1 tablet under the tongue daily.   ferrous sulfate 325 (65 FE) MG EC tablet TAKE 1 TABLET BY MOUTH EVERY DAY   fish oil-omega-3 fatty acids 1000 MG capsule Take 1 g by mouth every morning.   levothyroxine 50 MCG tablet Commonly known as: SYNTHROID TAKE 1 TABLET BY MOUTH IN THE MORNING   metoprolol tartrate 25 MG tablet Commonly known as: LOPRESSOR TAKE ONE TABLET BY MOUTH TWICE A  DAY   nitroGLYCERIN 0.4 MG SL tablet Commonly known as: NITROSTAT Place 1 tablet (0.4 mg total) under the tongue every 5 (five) minutes as needed for chest pain.   VITAMIN D3 PO Take 2,000 Units by mouth daily.   zinc oxide 20 % ointment Apply 1 Application topically as needed for irritation.        Review of Systems  Unable to perform ROS: Dementia    Immunization History  Administered Date(s) Administered   Fluad Quad(high Dose 65+) 11/21/2018, 12/25/2019, 01/02/2021, 12/16/2021   Influenza Split 12/15/2006, 12/06/2007, 12/31/2009, 11/01/2011   Influenza Whole 12/15/2006, 12/06/2007, 12/31/2009   Influenza, High Dose Seasonal PF 11/11/2014, 11/12/2015, 11/15/2016, 11/18/2017, 11/21/2018, 01/02/2021   Influenza,inj,Quad PF,6+ Mos 11/01/2012, 11/09/2013   Influenza-Unspecified 11/01/2011, 11/01/2012, 11/09/2013   Moderna SARS-COV2 Booster  Vaccination 01/07/2020   Moderna Sars-Covid-2 Vaccination 02/26/2019, 03/26/2019   PFIZER(Purple Top)SARS-COV-2 Vaccination 12/29/2021   Pneumococcal Conjugate-13 11/09/2013   Pneumococcal Polysaccharide-23 02/23/2004   Td 02/22/2002   Td (Adult),5 Lf Tetanus Toxid, Preservative Free 02/22/2002   Tdap 01/22/2014   Pertinent  Health Maintenance Due  Topic Date Due   INFLUENZA VACCINE  Completed   COLONOSCOPY (Pts 45-69yrs Insurance coverage will need to be confirmed)  Discontinued      12/02/2021    2:35 PM 12/10/2021   11:45 AM 12/18/2021   10:15 AM 01/25/2022    1:55 PM 02/05/2022    9:54 AM  Fall Risk  Falls in the past year? 1 1 1 1 1   Was there an injury with Fall? 0 1 0 0 0  Fall Risk Category Calculator 2 3 2 2 2   Fall Risk Category (Retired) Moderate High Moderate Moderate Moderate  (RETIRED) Patient Fall Risk Level Moderate fall risk Moderate fall risk Moderate fall risk Moderate fall risk Moderate fall risk  Patient at Risk for Falls Due to History of fall(s) History of fall(s) History of fall(s);Impaired balance/gait;Impaired mobility History of fall(s);Impaired balance/gait;Impaired mobility History of fall(s);Impaired balance/gait;Impaired mobility  Fall risk Follow up Falls evaluation completed;Falls prevention discussed Falls evaluation completed Falls evaluation completed;Education provided;Falls prevention discussed Falls evaluation completed Falls evaluation completed   Functional Status Survey:    Vitals:   05/14/22 1050  BP: (!) 123/58  Pulse: 65  Resp: 18  Temp: 97.9 F (36.6 C)  SpO2: 98%  Weight: 142 lb (64.4 kg)  Height: 5\' 9"  (1.753 m)   Body mass index is 20.97 kg/m. Physical Exam Vitals reviewed.  Constitutional:      General: He is not in acute distress. HENT:     Head: Normocephalic.     Right Ear: There is no impacted cerumen.     Left Ear: There is no impacted cerumen.     Nose: Nose normal.     Mouth/Throat:     Mouth: Mucous  membranes are moist.  Eyes:     General:        Right eye: No discharge.        Left eye: No discharge.  Cardiovascular:     Rate and Rhythm: Normal rate and regular rhythm.     Pulses: Normal pulses.     Heart sounds: Normal heart sounds.  Pulmonary:     Effort: Pulmonary effort is normal. No respiratory distress.     Breath sounds: Normal breath sounds. No wheezing.  Abdominal:     General: Bowel sounds are normal. There is no distension.     Palpations: Abdomen is soft.     Tenderness: There  is no abdominal tenderness.  Musculoskeletal:     Cervical back: Neck supple.     Right lower leg: No edema.     Left lower leg: No edema.  Skin:    General: Skin is warm and dry.     Capillary Refill: Capillary refill takes less than 2 seconds.  Neurological:     General: No focal deficit present.     Mental Status: He is alert. Mental status is at baseline.     Motor: Weakness present.     Gait: Gait abnormal.     Comments: wheelchair  Psychiatric:        Mood and Affect: Mood normal.        Behavior: Behavior normal.     Comments: Followes commands, alert to self/person/place     Labs reviewed: Recent Labs    07/28/21 1025 12/11/21 0000 02/18/22 0000  NA 141 137 136*  K 4.0 4.2 4.6  CL 109 103 104  CO2 29 28* 24*  GLUCOSE 84  --   --   BUN 24* 21 23*  CREATININE 1.16 1.1 1.0  CALCIUM 9.1 8.7 8.5*   Recent Labs    07/28/21 1025 12/11/21 0000  AST 15 17  ALT 11 16  ALKPHOS 49 68  BILITOT 0.5  --   PROT 6.1*  --   ALBUMIN 3.4* 3.4*   Recent Labs    07/28/21 1025 12/11/21 0000 02/18/22 0000  WBC 5.0 5.1 6.9  NEUTROABS 3.9 3,738.00  --   HGB 11.3* 12.1* 10.7*  HCT 34.3* 36* 32*  MCV 93.5  --   --   PLT 140* 239 182   Lab Results  Component Value Date   TSH 2.42 01/28/2021   Lab Results  Component Value Date   HGBA1C 5.6 07/01/2016   Lab Results  Component Value Date   CHOL 122 12/11/2021   HDL 51 12/11/2021   LDLCALC 55 12/11/2021   LDLDIRECT  159.3 05/17/2007   TRIG 75 12/11/2021   CHOLHDL 2 01/28/2021    Significant Diagnostic Results in last 30 days:  No results found.  Assessment/Plan 1. Weight loss - BMI 20.97 - weight down 5 lbs in 2 months - cont monthly weights  2. ADENOCARCINOMA, PROSTATE, GLEASON GRADE 3 - followed by Dr. Burnell Blanks - on Lupron injections every 3 months  3. Urethral obstruction - see above - left urethral sten placed 02/2022  4. Moderate vascular dementia without behavioral disturbance, psychotic disturbance, mood disturbance, or anxiety (HCC) - no behaviors - BIMS now 8/15> was 11/15 (12/2021) - not on medication  5. Chronic diastolic CHF (congestive heart failure) (HCC) - compensated  6. Essential hypertension - controlled - cont metoprolol  7. Coronary artery disease involving native coronary artery of native heart without angina pectoris - on statin  8. Acquired hypothyroidism - cont levothyroxine  9. Iron deficiency anemia, unspecified iron deficiency anemia type - hgb stable - cont ferrous sulfate    Family/ staff Communication: plan discussed with patient and nurse  Labs/tests ordered: none

## 2022-05-20 DIAGNOSIS — C61 Malignant neoplasm of prostate: Secondary | ICD-10-CM | POA: Diagnosis not present

## 2022-05-31 ENCOUNTER — Non-Acute Institutional Stay (SKILLED_NURSING_FACILITY): Payer: Medicare HMO | Admitting: Orthopedic Surgery

## 2022-05-31 ENCOUNTER — Encounter: Payer: Self-pay | Admitting: Orthopedic Surgery

## 2022-05-31 DIAGNOSIS — L02212 Cutaneous abscess of back [any part, except buttock]: Secondary | ICD-10-CM

## 2022-05-31 DIAGNOSIS — F01B0 Vascular dementia, moderate, without behavioral disturbance, psychotic disturbance, mood disturbance, and anxiety: Secondary | ICD-10-CM | POA: Diagnosis not present

## 2022-05-31 DIAGNOSIS — R69 Illness, unspecified: Secondary | ICD-10-CM | POA: Diagnosis not present

## 2022-05-31 NOTE — Progress Notes (Signed)
Location:  [facility] Room Number: 28-A Place of Service:  SNF (31) Provider:  Octavia Heir, NP   Octavia Heir, NP  Patient Care Team: Octavia Heir, NP as PCP - General (Adult Health Nurse Practitioner) Verner Chol, [facility] (Inactive) as Pharmacist (Pharmacist) Mahlon Gammon, MD as Consulting Physician (Internal Medicine)  Extended Emergency Contact Information Primary Emergency Contact: Litz,Geraldine Address: [address] [place]          [place] [address] D[place] of [place] Home Phone: [phone] Relation: Spouse Secondary Emergency Contact: Colvin Caroli Mobile Phone: [phone] Relation: Daughter  Code Status:  DNR Goals of care: Advanced Directive information    05/31/2022    3:48 PM  Advanced Directives  Does Patient Have a Medical Advance Directive? Yes  Type of Estate agent of [place];Out of facility DNR (pink MOST or yellow form)  Does patient want to make changes to medical advance directive? No - Patient declined  Copy of Healthcare Power of Attorney in Chart? Yes - validated most recent copy scanned in chart (See row information)     Chief Complaint  Patient presents with   Acute Visit    Skin infection.    HPI:  Pt is a 89 y.o. male seen today for acute visit due to skin abscess.   PMH: CAD with CABG x 2, HTN, HLD, CHF, mitral valve repair 2015, prostate cancer, TURP 2018, left ureteroscopy with left stent placement 10/2021, Diverticulosis, GERD, GI bleed, hypothyroidism, osteopenia, and falls.   He is a poor historian due to dementia. Wife reports benign growth to middle back for years. Today, nursing reports increased erythema, swelling and tenderness to lower portion of growth. He does not like laying in bed on his back at this time due to pain. No recent injury or fall. Afebrile. Vitals stable.   Past Surgical History:  Procedure Laterality Date   CARDIAC CATHETERIZATION  2015   CATARACT  EXTRACTION, BILATERAL Bilateral 2014   per Dr. Nile Riggs    COLONOSCOPY  2011   per Dr. Jarold Motto, benign polyps, no repeats needed    CORONARY ARTERY BYPASS GRAFT N/A 02/28/2013   Procedure: CORONARY ARTERY BYPASS GRAFTING (CABG);  Surgeon: Purcell Nails, MD;  Location[facility] OR;  Service: Open Heart Surgery;  Laterality: N/A;  CABG x 2,  using left internal mammary artery and right leg saphenous vein harvested endoscopically   ESOPHAGOGASTRODUODENOSCOPY (EGD) WITH PROPOFOL N/A 01/11/2019   Procedure: ESOPHAGOGASTRODUODENOSCOPY (EGD) WITH PROPOFOL;  Surgeon: Sherrilyn Rist, MD;  [facility];  Service: Gastroenterology;  Laterality: N/A;   HEMORRHOID SURGERY     INTRAOPERATIVE TRANSESOPHAGEAL ECHOCARDIOGRAM N/A 02/28/2013   Procedure: INTRAOPERATIVE TRANSESOPHAGEAL ECHOCARDIOGRAM;  Surgeon: Purcell Nails, MD;  Location: [facility] OR;  Service: Open Heart Surgery;  Laterality: N/A;   LEFT HEART CATHETERIZATION WITH CORONARY ANGIOGRAM N/A 02/26/2013   Procedure: LEFT HEART CATHETERIZATION WITH CORONARY ANGIOGRAM;  Surgeon: Marykay Lex, MD;  Location: [facility] CATH LAB;  Service: Cardiovascular;  Laterality: N/A;   MAZE N/A 02/28/2013   Procedure: MAZE;  Surgeon: Purcell Nails, MD;  Location: [facility] OR;  Service: Open Heart Surgery;  Laterality: N/A;   MITRAL VALVE REPAIR N/A 02/28/2013   Procedure: MITRAL VALVE REPAIR (MVR);  Surgeon: Purcell Nails, MD;  Location: [facility] OR;  Service: Open Heart Surgery;  Laterality: N/A;   PROSTATE BIOPSY     TRANSURETHRAL RESECTION OF PROSTATE N/A 02/04/2017   Procedure: TRANSURETHRAL RESECTION OF THE  PROSTATE (TURP);  Surgeon: Sebastian AcheManny, Theodore, MD;  [facility];  Service: Urology;  Laterality: N/A;    Allergies  Allergen Reactions   Other Other (See Comments)    ANESTHESIA.... Gives him like "Harlene SaltsSun Downers Syndrome" per family member. ANESTHESIA.... Gives him like "Harlene SaltsSun Downers Syndrome" per family member. Occurred post op CABG 2015, required readmission and  rehab    Outpatient Encounter Medications as of 05/31/2022  Medication Sig   acetaminophen (TYLENOL) 500 MG tablet Take 2 tablets (1,000 mg total) by mouth 2 (two) times daily as needed.   alum & mag hydroxide-simeth (MAALOX PLUS) 400-400-40 MG/5ML suspension Take 30 mLs by mouth every 4 (four) hours as needed for indigestion.   atorvastatin (LIPITOR) 10 MG tablet TAKE 1 TABLET EVERY DAY AT 6PM   Cholecalciferol (VITAMIN D3 PO) Take 2,000 Units by mouth daily.   Cyanocobalamin 2500 MCG SUBL Place 1 tablet under the tongue daily.   doxycycline (DORYX) 100 MG EC tablet Take 100 mg by mouth 2 (two) times daily.   ferrous sulfate 325 (65 FE) MG EC tablet TAKE 1 TABLET BY MOUTH EVERY DAY   fish oil-omega-3 fatty acids 1000 MG capsule Take 1 g by mouth every morning.   levothyroxine (SYNTHROID) 50 MCG tablet TAKE 1 TABLET BY MOUTH IN THE MORNING   metoprolol tartrate (LOPRESSOR) 25 MG tablet TAKE ONE TABLET BY MOUTH TWICE A DAY   nitroGLYCERIN (NITROSTAT) 0.4 MG SL tablet Place 1 tablet (0.4 mg total) under the tongue every 5 (five) minutes as needed for chest pain.   zinc oxide 20 % ointment Apply 1 Application topically as needed for irritation.   No facility-administered encounter medications on file as of 05/31/2022.    Review of Systems  Unable to perform ROS: Dementia    Immunization History  Administered Date(s) Administered   Fluad Quad(high Dose 65+) 11/21/2018, 12/25/2019, 01/02/2021, 12/16/2021   Influenza Split 12/15/2006, 12/06/2007, 12/31/2009, 11/01/2011   Influenza Whole 12/15/2006, 12/06/2007, 12/31/2009   Influenza, High Dose Seasonal PF 11/11/2014, 11/12/2015, 11/15/2016, 11/18/2017, 11/21/2018, 01/02/2021   Influenza,inj,Quad PF,6+ Mos 11/01/2012, 11/09/2013   Influenza-Unspecified 11/01/2011, 11/01/2012, 11/09/2013   Moderna SARS-COV2 Booster Vaccination 01/07/2020   Moderna Sars-Covid-2 Vaccination 02/26/2019, 03/26/2019   PFIZER(Purple Top)SARS-COV-2 Vaccination  12/29/2021   Pneumococcal Conjugate-13 11/09/2013   Pneumococcal Polysaccharide-23 02/23/2004   Td 02/22/2002   Td (Adult),5 Lf Tetanus Toxid, Preservative Free 02/22/2002   Tdap 01/22/2014   Pertinent  Health Maintenance Due  Topic Date Due   INFLUENZA VACCINE  09/23/2022   COLONOSCOPY (Pts 45-714yrs Insurance coverage will need to be confirmed)  Discontinued      12/02/2021    2:35 PM 12/10/2021   11:45 AM 12/18/2021   10:15 AM 01/25/2022    1:55 PM 02/05/2022    9:54 AM  Fall Risk  Falls in the past year? 1 1 1 1 1   Was there an injury with Fall? 0 1 0 0 0  Fall Risk Category Calculator 2 3 2 2 2   Fall Risk Category (Retired) Moderate High Moderate Moderate Moderate  (RETIRED) Patient Fall Risk Level Moderate fall risk Moderate fall risk Moderate fall risk Moderate fall risk Moderate fall risk  Patient at Risk for Falls Due to History of fall(s) History of fall(s) History of fall(s);Impaired balance/gait;Impaired mobility History of fall(s);Impaired balance/gait;Impaired mobility History of fall(s);Impaired balance/gait;Impaired mobility  Fall risk Follow up Falls evaluation completed;Falls prevention discussed Falls evaluation completed Falls evaluation completed;Education provided;Falls prevention discussed Falls evaluation completed Falls evaluation completed  Functional Status Survey:    Vitals:   05/31/22 1538  BP: 117/62  Pulse: 67  Resp: 18  Temp: 98 F (36.7 C)  SpO2: 97%  Weight: 137 lb 8 oz (62.4 kg)  Height:  (1.753 m)   Body mass index is 20.31 kg/m. Physical Exam Vitals reviewed.  Constitutional:      General: He is not in acute distress. HENT:     Head: Normocephalic.  Cardiovascular:     Rate and Rhythm: Normal rate and regular rhythm.     Pulses: Normal pulses.     Heart sounds: Normal heart sounds.  Pulmonary:     Effort: Pulmonary effort is normal. No respiratory distress.     Breath sounds: Normal breath sounds. No wheezing or rales.   Abdominal:     General: Bowel sounds are normal.     Palpations: Abdomen is soft.  Musculoskeletal:     Cervical back: Neck supple.     Right lower leg: No edema.     Left lower leg: No edema.  Skin:    General: Skin is warm.     Capillary Refill: Capillary refill takes less than 2 seconds.     Findings: Erythema present.     Comments: Approx 1-2 growth/abscess to middle back, increased erythema/swelling/tenderness noted to lower portion, no skin breakdown or drainage, surrounding skin intact.   Neurological:     General: No focal deficit present.     Mental Status: He is alert. Mental status is at baseline.     Motor: Weakness present.     Gait: Gait abnormal.     Comments: wheelchair  Psychiatric:        Mood and Affect: Mood normal.     Labs reviewed: Recent Labs    07/28/21 1025 12/11/21 0000 02/18/22 0000  NA 141 137 136*  K 4.0 4.2 4.6  CL 109 103 104  CO2 29 28* 24*  GLUCOSE 84  --   --   BUN 24* 21 23*  CREATININE 1.16 1.1 1.0  CALCIUM 9.1 8.7 8.5*   Recent Labs    07/28/21 1025 12/11/21 0000  AST 15 17  ALT 11 16  ALKPHOS 49 68  BILITOT 0.5  --   PROT 6.1*  --   ALBUMIN 3.4* 3.4*   Recent Labs    07/28/21 1025 12/11/21 0000 02/18/22 0000  WBC 5.0 5.1 6.9  NEUTROABS 3.9 3,738.00  --   HGB 11.3* 12.1* 10.7*  HCT 34.3* 36* 32*  MCV 93.5  --   --   PLT 140* 239 182   Lab Results  Component Value Date   TSH 2.42 01/28/2021   Lab Results  Component Value Date   HGBA1C 5.6 07/01/2016   Lab Results  Component Value Date   CHOL 122 12/11/2021   HDL 51 12/11/2021   LDLCALC 55 12/11/2021   LDLDIRECT 159.3 05/17/2007   TRIG 75 12/11/2021   CHOLHDL 2 01/28/2021    Significant Diagnostic Results in last 30 days:  No results found.  Assessment/Plan 1. Cutaneous abscess of back excluding buttocks - h/o benign growth to middle back for years per wife - increased erythema/swelling/tenderness to growth, afebrile - ? Lipoma - start  doxycycline 100 mg po BID x 7 days  2. Moderate vascular dementia without behavioral disturbance, psychotic disturbance, mood disturbance, or anxiety - no behaviors - dependent with ADLs except feeding - not on medication - cont skilled nursing care    Family/ staff Communication:  plan discussed with patient and nurse  Labs/tests ordered:  none

## 2022-06-03 ENCOUNTER — Encounter: Payer: Self-pay | Admitting: Internal Medicine

## 2022-06-03 ENCOUNTER — Non-Acute Institutional Stay (SKILLED_NURSING_FACILITY): Payer: Medicare HMO | Admitting: Internal Medicine

## 2022-06-03 DIAGNOSIS — I10 Essential (primary) hypertension: Secondary | ICD-10-CM | POA: Diagnosis not present

## 2022-06-03 DIAGNOSIS — I2510 Atherosclerotic heart disease of native coronary artery without angina pectoris: Secondary | ICD-10-CM | POA: Diagnosis not present

## 2022-06-03 DIAGNOSIS — C61 Malignant neoplasm of prostate: Secondary | ICD-10-CM | POA: Diagnosis not present

## 2022-06-03 DIAGNOSIS — E039 Hypothyroidism, unspecified: Secondary | ICD-10-CM | POA: Diagnosis not present

## 2022-06-03 DIAGNOSIS — K219 Gastro-esophageal reflux disease without esophagitis: Secondary | ICD-10-CM

## 2022-06-03 DIAGNOSIS — F01B0 Vascular dementia, moderate, without behavioral disturbance, psychotic disturbance, mood disturbance, and anxiety: Secondary | ICD-10-CM

## 2022-06-03 DIAGNOSIS — L02212 Cutaneous abscess of back [any part, except buttock]: Secondary | ICD-10-CM | POA: Diagnosis not present

## 2022-06-03 DIAGNOSIS — D509 Iron deficiency anemia, unspecified: Secondary | ICD-10-CM | POA: Diagnosis not present

## 2022-06-03 DIAGNOSIS — N368 Other specified disorders of urethra: Secondary | ICD-10-CM

## 2022-06-03 DIAGNOSIS — R69 Illness, unspecified: Secondary | ICD-10-CM | POA: Diagnosis not present

## 2022-06-03 NOTE — Progress Notes (Signed)
Location:  [facility] Room Number: 28a Place of Service:  SNF (31) Provider:  Vance Gather, NP  Patient Care Team: Octavia Heir, NP as PCP - General (Adult Health Nurse Practitioner) Verner Chol, [facility] (Inactive) as Pharmacist (Pharmacist) Mahlon Gammon, MD as Consulting Physician (Internal Medicine)  Extended Emergency Contact Information Primary Emergency Contact: Aslin,Geraldine Address: [address] [place]          [place] [address] [place] Home Phone: [phone] Relation: Spouse Secondary Emergency Contact: Colvin Caroli Mobile Phone: [phone] Relation: Daughter  Code Status:  DNR Goals of care: Advanced Directive information    06/03/2022   11:16 AM  Advanced Directives  Does Patient Have a Medical Advance Directive? Yes  Type of Estate agent of [place];Out of facility DNR (pink MOST or yellow form)  Does patient want to make changes to medical advance directive? No - Patient declined  Copy of Healthcare Power of Attorney in Chart? Yes - validated most recent copy scanned in chart (See row information)     Chief Complaint  Patient presents with   Medical Management of Chronic Issues    Routine visit   Immunizations    Discuss the need for Shingles vaccine     HPI:  Pt is a 89 y.o. male seen today for medical management of chronic diseases.    Lives in SNF with his Wife Patient has a history of CAD with CABG x2, hypertension, hyperlipidemia, CHF, mitral valve repair 2015,  history of GI bleed and hypothyroidism    prostate cancer diagnosed in 2012, underwent TURP in 2019 Cancer has advanced and has affected left urethra.  Gets Stent placed Q 3 months Also on Lupron injections every 3 months Patient also has now unstable gait and ambulates with wheelchair Cognitive decline Recent MRI shows small cortical infarcts and needing more help with ADLs.   Doing well in  SNF Does have difficulty word findings Plans to go for stent change by Urology Wt Readings from Last 3 Encounters:  06/03/22 137 lb 8 oz (62.4 kg)  05/31/22 137 lb 8 oz (62.4 kg)  05/14/22 142 lb (64.4 kg)    Has lost some weight but per him and Nurses he [place] eating well Can do his transfers with mild assist Usually stays in his wheelchair No Falls or behavior issue Comes to his Wife's room and stays with her   Past Medical History:  Diagnosis Date   Complication of anesthesia    "serious cognisance problems since my OR in January" (03/13/2013   Coronary artery disease    Diverticulosis    Hiatal hernia    Hx of echocardiogram    Echo 5/16:  Mild focal basal septal hypertrophy, EF 55%, mild AI, MV repair ok with trivial MR (mean 3 mmHg), mild LAE, mild RAE, PASP 35 mmHg   Hypertension    Hypothyroidism    Irritable bowel syndrome    PAF (paroxysmal atrial fibrillation) 05/03/2014   Personal history of colonic polyps    tubular adenoma   Pleural effusion, bilateral 03/12/2013   Small to moderate, L>R   Prostate cancer 1991   sees Dr. Gala Lewandowsky at [facility], observation only    S/P CABG x 2 02/28/2013   LIMA to LAD, SVG to OM1, EVH via right thigh   S/P Maze operation for atrial fibrillation 02/28/2013   Complete bilateral atrial lesion set using bipolar radiofrequency and cryothermy ablation with clipping of  LA appendage   S/P mitral valve repair, maze procedure, and CABG x2 02/28/2013   Complex valvuloplasty including triangular resection of posterior leaflet, 30 mm Sorin Memo 3D ring annuloplasty   Past Surgical History:  Procedure Laterality Date   CARDIAC CATHETERIZATION  2015   CATARACT EXTRACTION, BILATERAL Bilateral 2014   per Dr. Nile Riggs    COLONOSCOPY  2011   per Dr. Jarold Motto, benign polyps, no repeats needed    CORONARY ARTERY BYPASS GRAFT N/A 02/28/2013   Procedure: CORONARY ARTERY BYPASS GRAFTING (CABG);  Surgeon: Purcell Nails, MD;  Location: [facility];  Service: Open Heart  Surgery;  Laterality: N/A;  CABG x 2,  using left internal mammary artery and right leg saphenous vein harvested endoscopically   ESOPHAGOGASTRODUODENOSCOPY (EGD) WITH PROPOFOL N/A 01/11/2019   Procedure: ESOPHAGOGASTRODUODENOSCOPY (EGD) WITH PROPOFOL;  Surgeon: Sherrilyn Rist, MD;  Location: [facility] ENDOSCOPY;  Service: Gastroenterology;  Laterality: N/A;   HEMORRHOID SURGERY     INTRAOPERATIVE TRANSESOPHAGEAL ECHOCARDIOGRAM N/A 02/28/2013   Procedure: INTRAOPERATIVE TRANSESOPHAGEAL ECHOCARDIOGRAM;  Surgeon: Purcell Nails, MD;  Location: [facility] OR;  Service: Open Heart Surgery;  Laterality: N/A;   LEFT HEART CATHETERIZATION WITH CORONARY ANGIOGRAM N/A 02/26/2013   Procedure: LEFT HEART CATHETERIZATION WITH CORONARY ANGIOGRAM;  Surgeon: Marykay Lex, MD;  Location: [facility] CATH LAB;  Service: Cardiovascular;  Laterality: N/A;   MAZE N/A 02/28/2013   Procedure: MAZE;  Surgeon: Purcell Nails, MD;  Location[facility];  Service: Open Heart Surgery;  Laterality: N/A;   MITRAL VALVE REPAIR N/A 02/28/2013   Procedure: MITRAL VALVE REPAIR (MVR);  Surgeon: Purcell Nails, MD;  Location: [facility] OR;  Service: Open Heart Surgery;  Laterality: N/A;   PROSTATE BIOPSY     TRANSURETHRAL RESECTION OF PROSTATE N/A 02/04/2017   Procedure: TRANSURETHRAL RESECTION OF THE PROSTATE (TURP);  Surgeon: Sebastian Ache, MD;  Location: [facility] ORS;  Service: Urology;  Laterality: N/A;    Allergies  Allergen Reactions   Other Other (See Comments)    ANESTHESIA.... 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 ANESTHESIA.... 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 Occurred post op CABG 2015, required readmission and rehab    Outpatient Encounter Medications as of 06/03/2022  Medication Sig   acetaminophen (TYLENOL) 500 MG tablet Take 2 tablets (1,000 mg total) by mouth 2 (two) times daily as needed.   atorvastatin (LIPITOR) 10 MG tablet TAKE 1 TABLET EVERY DAY AT 6PM   Cholecalciferol (VITAMIN D3 PO) Take 2,000  Units by mouth daily.   Cyanocobalamin 2500 MCG SUBL Place 1 tablet under the tongue daily.   doxycycline (DORYX) 100 MG EC tablet Take 100 mg by mouth 2 (two) times daily.   ferrous sulfate 325 (65 FE) MG EC tablet TAKE 1 TABLET BY MOUTH EVERY DAY   fish oil-omega-3 fatty acids 1000 MG capsule Take 1 g by mouth every morning.   levothyroxine (SYNTHROID) 50 MCG tablet TAKE 1 TABLET BY MOUTH IN THE MORNING   metoprolol tartrate (LOPRESSOR) 25 MG tablet TAKE ONE TABLET BY MOUTH TWICE A DAY   nitroGLYCERIN (NITROSTAT) 0.4 MG SL tablet Place 1 tablet (0.4 mg total) under the tongue every 5 (five) minutes as needed for chest pain.   zinc oxide 20 % ointment Apply 1 Application topically as needed for irritation.   alum & mag hydroxide-simeth (MAALOX PLUS) 400-400-40 MG/5ML suspension Take 30 mLs by mouth every 4 (four) hours as needed for indigestion. (Patient not taking: Reported on 06/03/2022)  No facility-administered encounter medications on file as of 06/03/2022.    Review of Systems  Constitutional:  Negative for activity change, appetite change and unexpected weight change.  HENT: Negative.    Respiratory:  Negative for cough and shortness of breath.   Cardiovascular:  Negative for leg swelling.  Gastrointestinal:  Negative for constipation.  Genitourinary:  Negative for frequency.  Musculoskeletal:  Positive for gait problem. Negative for arthralgias and myalgias.  Skin: Negative.  Negative for rash.  Neurological:  Negative for dizziness and weakness.  Psychiatric/Behavioral:  Positive for confusion. Negative for sleep disturbance.   All other systems reviewed and are negative.   Immunization History  Administered Date(s) Administered   Fluad Quad(high Dose 65+) 11/21/2018, 12/25/2019, 01/02/2021, 12/16/2021   Influenza Split 12/15/2006, 12/06/2007, 12/31/2009, 11/01/2011   Influenza Whole 12/15/2006, 12/06/2007, 12/31/2009   Influenza, High Dose Seasonal PF 11/11/2014,  11/12/2015, 11/15/2016, 11/18/2017, 11/21/2018, 01/02/2021   Influenza,inj,Quad PF,6+ Mos 11/01/2012, 11/09/2013   Influenza-Unspecified 11/01/2011, 11/01/2012, 11/09/2013   Moderna SARS-COV2 Booster Vaccination 01/07/2020   Moderna Sars-Covid-2 Vaccination 02/26/2019, 03/26/2019   PFIZER(Purple Top)SARS-COV-2 Vaccination 12/29/2021   Pneumococcal Conjugate-13 11/09/2013   Pneumococcal Polysaccharide-23 02/23/2004   Td 02/22/2002   Td (Adult),5 Lf Tetanus Toxid, Preservative Free 02/22/2002   Tdap 01/22/2014   Pertinent  Health Maintenance Due  Topic Date Due   INFLUENZA VACCINE  09/23/2022   COLONOSCOPY (Pts 45-33yrs Insurance coverage will need to be confirmed)  Discontinued      12/10/2021   11:45 AM 12/18/2021   10:15 AM 01/25/2022    1:55 PM 02/05/2022    9:54 AM 05/31/2022    3:57 PM  Fall Risk  Falls in the past year? Was there an injury with Fall? 1 0 0 0 0  Fall Risk Category Calculator Fall Risk Category (Retired) High Moderate Moderate Moderate   (RETIRED) Patient Fall Risk Level Moderate fall risk Moderate fall risk Moderate fall risk Moderate fall risk   Patient at Risk for Falls Due to History of fall(s) History of fall(s);Impaired balance/gait;Impaired mobility History of fall(s);Impaired balance/gait;Impaired mobility History of fall(s);Impaired balance/gait;Impaired mobility History of fall(s);Impaired balance/gait;Impaired mobility;Mental status change  Fall risk Follow up Falls evaluation completed Falls evaluation completed;Education provided;Falls prevention discussed Falls evaluation completed Falls evaluation completed Falls evaluation completed;Education provided;Falls prevention discussed   Functional Status Survey:    Vitals:   06/03/22 1113  BP: (!) 90/56  Pulse: 75  Resp: 18  Temp: 98.3 F (36.8 C)  TempSrc: Temporal  SpO2: 95%  Weight: 137 lb 8 oz (62.4 kg)  Height:  (1.753 m)   Body mass index is 20.31  kg/m. Physical Exam Vitals reviewed.  Constitutional:      Appearance: Normal appearance.  HENT:     Head: Normocephalic.     Nose: Nose normal.     Mouth/Throat:     Mouth: Mucous membranes are moist.     Pharynx: Oropharynx is clear.  Eyes:     Pupils: Pupils are equal, round, and reactive to light.  Cardiovascular:     Rate and Rhythm: Normal rate and regular rhythm.     Pulses: Normal pulses.     Heart sounds: No murmur heard. Pulmonary:     Effort: Pulmonary effort is normal. No respiratory distress.     Breath sounds: Normal breath sounds. No rales.  Abdominal:     General: Abdomen is flat. Bowel sounds are normal.  Palpations: Abdomen is soft.  Musculoskeletal:        General: No swelling.     Cervical back: Neck supple.  Skin:    General: Skin is warm.     Comments: Cyst mild Redness now  Neurological:     General: No focal deficit present.     Mental Status: He is alert.     Comments: Struggles with Word Finding Good Cognition   Psychiatric:        Mood and Affect: Mood normal.        Thought Content: Thought content normal.     Labs reviewed: Recent Labs    07/28/21 1025 12/11/21 0000 02/18/22 0000  NA 141 137 136*  K 4.0 4.2 4.6  CL 109 103 104  CO2 29 28* 24*  GLUCOSE 84  --   --   BUN 24* 21 23*  CREATININE 1.16 1.1 1.0  CALCIUM 9.1 8.7 8.5*   Recent Labs    07/28/21 1025 12/11/21 0000  AST 15 17  ALT 11 16  ALKPHOS 49 68  BILITOT 0.5  --   PROT 6.1*  --   ALBUMIN 3.4* 3.4*   Recent Labs    07/28/21 1025 12/11/21 0000 02/18/22 0000  WBC 5.0 5.1 6.9  NEUTROABS 3.9 3,738.00  --   HGB 11.3* 12.1* 10.7*  HCT 34.3* 36* 32*  MCV 93.5  --   --   PLT 140* 239 182   Lab Results  Component Value Date   TSH 2.42 01/28/2021   Lab Results  Component Value Date   HGBA1C 5.6 07/01/2016   Lab Results  Component Value Date   CHOL 122 12/11/2021   HDL 51 12/11/2021   LDLCALC 55 12/11/2021   LDLDIRECT 159.3 05/17/2007   TRIG  75 12/11/2021   CHOLHDL 2 01/28/2021    Significant Diagnostic Results in last 30 days:  No results found.  Assessment/Plan 1. Moderate vascular dementia without behavioral disturbance, psychotic disturbance, mood disturbance, or anxiety Doing well in SNF On Statin Not on aspirin due to Urethral Bleed and GI bleed Needs help with his ADLS  2. Cutaneous abscess of back excluding buttocks Doing well with Doxycyline  3. ADENOCARCINOMA, PROSTATE, GLEASON GRADE 3 Follows with Urology Do not have their notes 4. Urethral obstruction Gets Stent changed Q 3 Months  5. Essential hypertension BP running low Change Metoprolol to 12.5 mg BID  6. Coronary artery disease i On statin 7. Acquired hypothyroidism TSH normal in 10/23  8. Iron deficiency anemia, unspecified iron deficiency anemia type Due to Hematuria On Iron HGB stable   9  HLD On Statin LDL  55 in 10/23   Family/ staff Communication:   Labs/tests ordered:

## 2022-06-09 DIAGNOSIS — Z79890 Hormone replacement therapy (postmenopausal): Secondary | ICD-10-CM | POA: Diagnosis not present

## 2022-06-09 DIAGNOSIS — I10 Essential (primary) hypertension: Secondary | ICD-10-CM | POA: Diagnosis not present

## 2022-06-09 DIAGNOSIS — N3289 Other specified disorders of bladder: Secondary | ICD-10-CM | POA: Diagnosis not present

## 2022-06-09 DIAGNOSIS — K219 Gastro-esophageal reflux disease without esophagitis: Secondary | ICD-10-CM | POA: Diagnosis not present

## 2022-06-09 DIAGNOSIS — I25110 Atherosclerotic heart disease of native coronary artery with unstable angina pectoris: Secondary | ICD-10-CM | POA: Diagnosis not present

## 2022-06-09 DIAGNOSIS — C61 Malignant neoplasm of prostate: Secondary | ICD-10-CM | POA: Diagnosis not present

## 2022-06-09 DIAGNOSIS — N131 Hydronephrosis with ureteral stricture, not elsewhere classified: Secondary | ICD-10-CM | POA: Diagnosis not present

## 2022-06-09 DIAGNOSIS — I480 Paroxysmal atrial fibrillation: Secondary | ICD-10-CM | POA: Diagnosis not present

## 2022-06-09 DIAGNOSIS — Z79899 Other long term (current) drug therapy: Secondary | ICD-10-CM | POA: Diagnosis not present

## 2022-06-09 DIAGNOSIS — E039 Hypothyroidism, unspecified: Secondary | ICD-10-CM | POA: Diagnosis not present

## 2022-06-09 DIAGNOSIS — I493 Ventricular premature depolarization: Secondary | ICD-10-CM | POA: Diagnosis not present

## 2022-06-09 DIAGNOSIS — N135 Crossing vessel and stricture of ureter without hydronephrosis: Secondary | ICD-10-CM | POA: Diagnosis not present

## 2022-06-10 ENCOUNTER — Non-Acute Institutional Stay (SKILLED_NURSING_FACILITY): Payer: Medicare HMO | Admitting: Internal Medicine

## 2022-06-10 ENCOUNTER — Encounter: Payer: Self-pay | Admitting: Internal Medicine

## 2022-06-10 DIAGNOSIS — M25531 Pain in right wrist: Secondary | ICD-10-CM | POA: Diagnosis not present

## 2022-06-10 DIAGNOSIS — M25431 Effusion, right wrist: Secondary | ICD-10-CM

## 2022-06-10 NOTE — Progress Notes (Signed)
Location:  [facility] Room Number: 28A Place of Service:  SNF (31) Provider:  Jessy Oto, NP  Patient Care Team: Octavia Heir, NP as PCP - General (Adult Health Nurse Practitioner) Verner Chol, [facility] (Inactive) as Pharmacist (Pharmacist) Mahlon Gammon, MD as Consulting Physician (Internal Medicine)  Extended Emergency Contact Information Primary Emergency Contact: Valeri,Geraldine Address: [address] [place]          [place] [address] [place] Home Phone: [phone] Relation: Spouse Secondary Emergency Contact: Colvin Caroli Mobile Phone: [phone] Relation: Daughter  Code Status:  DNR Goals of care: Advanced Directive information    06/10/2022    2:37 PM  Advanced Directives  Does Patient Have a Medical Advance Directive? Yes  Type of Estate agent of [place];Out of facility DNR (pink MOST or yellow form)  Does patient want to make changes to medical advance directive? No - Patient declined  Copy of Healthcare Power of Attorney in Chart? Yes - validated most recent copy scanned in chart (See row information)     Chief Complaint  Patient presents with   Acute Visit    Patient is being seen for right hand swelling   Immunizations    Patient is due for a shingles vaccine    HPI:  Pt is a 89 y.o. male seen today for an acute visit for right hand swelling noticed few days ago  Patient was noticed with the swelling, redness and pain in his right hand and wrist No history of fall. Patient got some Tylenol and felt better He also said the pain and the redness is much better. Patient did go for his urethral stent procedure yesterday.  The IV was placed on the other hand.  And the swelling in the right hand was present before he went for procedure.  He is on Cipro after the procedure  Other issues Lives in SNF with his Wife Patient has a history of CAD with CABG x2, hypertension,  hyperlipidemia, CHF, mitral valve repair 2015,  history of GI bleed and hypothyroidism    prostate cancer diagnosed in 2012, underwent TURP in 2019 Cancer has advanced and has affected left urethra.  Gets Stent placed Q 3 months Also on Lupron injections every 3 months Patient also has now unstable gait and ambulates with wheelchair Cognitive decline Recent MRI shows small cortical infarcts and needing more help with ADLs.   Past Medical History:  Diagnosis Date   Complication of anesthesia    "serious cognisance problems since my OR in January" (03/13/2013   Coronary artery disease    Diverticulosis    Hiatal hernia    Hx of echocardiogram    Echo 5/16:  Mild focal basal septal hypertrophy, EF 55%, mild AI, MV repair ok with trivial MR (mean 3 mmHg), mild LAE, mild RAE, PASP 35 mmHg   Hypertension    Hypothyroidism    Irritable bowel syndrome    PAF (paroxysmal atrial fibrillation) 05/03/2014   Personal history of colonic polyps    tubular adenoma   Pleural effusion, bilateral 03/12/2013   Small to moderate, L>R   Prostate cancer 1991   sees Dr. Gala Lewandowsky at [facility], observation only    S/P CABG x 2 02/28/2013   LIMA to LAD, SVG to OM1, EVH via right thigh   S/P Maze operation for atrial fibrillation 02/28/2013   Complete bilateral atrial lesion set using bipolar radiofrequency and cryothermy ablation with clipping of  LA appendage   S/P mitral valve repair, maze procedure, and CABG x2 02/28/2013   Complex valvuloplasty including triangular resection of posterior leaflet, 30 mm Sorin Memo 3D ring annuloplasty   Past Surgical History:  Procedure Laterality Date   CARDIAC CATHETERIZATION  2015   CATARACT EXTRACTION, BILATERAL Bilateral 2014   per Dr. Nile Riggs    COLONOSCOPY  2011   per Dr. Jarold Motto, benign polyps, no repeats needed    CORONARY ARTERY BYPASS GRAFT N/A 02/28/2013   Procedure: CORONARY ARTERY BYPASS GRAFTING (CABG);  Surgeon: Purcell Nails, MD;  Location: [facility] OR;  Service:  Open Heart Surgery;  Laterality: N/A;  CABG x 2,  using left internal mammary artery and right leg saphenous vein harvested endoscopically   ESOPHAGOGASTRODUODENOSCOPY (EGD) WITH PROPOFOL N/A 01/11/2019   Procedure: ESOPHAGOGASTRODUODENOSCOPY (EGD) WITH PROPOFOL;  Surgeon: Sherrilyn Rist, MD;  Location: [facility] ENDOSCOPY;  Service: Gastroenterology;  Laterality: N/A;   HEMORRHOID SURGERY     INTRAOPERATIVE TRANSESOPHAGEAL ECHOCARDIOGRAM N/A 02/28/2013   Procedure: INTRAOPERATIVE TRANSESOPHAGEAL ECHOCARDIOGRAM;  Surgeon: Purcell Nails, MD;  Location: [facility] OR;  Service: Open Heart Surgery;  Laterality: N/A;   LEFT HEART CATHETERIZATION WITH CORONARY ANGIOGRAM N/A 02/26/2013   Procedure: LEFT HEART CATHETERIZATION WITH CORONARY ANGIOGRAM;  Surgeon: Marykay Lex, MD;  Location: [facility] CATH LAB;  Service: Cardiovascular;  Laterality: N/A;   MAZE N/A 02/28/2013   Procedure: MAZE;  Surgeon: Purcell Nails, MD;  Location: [facility] OR;  Service: Open Heart Surgery;  Laterality: N/A;   MITRAL VALVE REPAIR N/A 02/28/2013   Procedure: MITRAL VALVE REPAIR (MVR);  Surgeon: Purcell Nails, MD;  Location: [facility] OR;  Service: Open Heart Surgery;  Laterality: N/A;   PROSTATE BIOPSY     TRANSURETHRAL RESECTION OF PROSTATE N/A 02/04/2017   Procedure: TRANSURETHRAL RESECTION OF THE PROSTATE (TURP);  Surgeon: Sebastian Ache, MD;  Location: [facility] ORS;  Service: Urology;  Laterality: N/A;    Allergies  Allergen Reactions   Other Other (See Comments)    ANESTHESIA.... 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 ANESTHESIA.... 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 Occurred post op CABG 2015, required readmission and rehab    Outpatient Encounter Medications as of 06/10/2022  Medication Sig   acetaminophen (TYLENOL) 500 MG tablet Take 2 tablets (1,000 mg total) by mouth 2 (two) times daily as needed.   atorvastatin (LIPITOR) 10 MG tablet TAKE 1 TABLET EVERY DAY AT 6PM   Cholecalciferol (VITAMIN D3 PO)  Take 2,000 Units by mouth daily.   Cyanocobalamin 2500 MCG SUBL Place 1 tablet under the tongue daily.   doxycycline (DORYX) 100 MG EC tablet Take 100 mg by mouth 2 (two) times daily.   ferrous sulfate 325 (65 FE) MG EC tablet TAKE 1 TABLET BY MOUTH EVERY DAY   fish oil-omega-3 fatty acids 1000 MG capsule Take 1 g by mouth every morning.   leuprolide (LUPRON) 22.5 MG injection Inject 45 mg into the muscle every 3 (three) months.   levothyroxine (SYNTHROID) 50 MCG tablet TAKE 1 TABLET BY MOUTH IN THE MORNING   metoprolol tartrate (LOPRESSOR) 25 MG tablet TAKE ONE TABLET BY MOUTH TWICE A DAY (Patient taking differently: 12.5 mg 2 (two) times daily.)   nitroGLYCERIN (NITROSTAT) 0.4 MG SL tablet Place 1 tablet (0.4 mg total) under the tongue every 5 (five) minutes as needed for chest pain.   zinc oxide 20 % ointment Apply 1 Application topically as needed for irritation.   No facility-administered encounter  medications on file as of 06/10/2022.    Review of Systems  Constitutional:  Negative for activity change, appetite change and unexpected weight change.  HENT: Negative.    Respiratory:  Negative for cough and shortness of breath.   Cardiovascular:  Negative for leg swelling.  Gastrointestinal:  Negative for constipation.  Genitourinary:  Negative for frequency.  Musculoskeletal:  Positive for gait problem. Negative for arthralgias and myalgias.  Skin: Negative.  Negative for rash.  Neurological:  Negative for dizziness and weakness.  Psychiatric/Behavioral:  Positive for confusion. Negative for sleep disturbance.   All other systems reviewed and are negative.   Immunization History  Administered Date(s) Administered   Fluad Quad(high Dose 65+) 11/21/2018, 12/25/2019, 01/02/2021, 12/16/2021   Influenza Split 12/15/2006, 12/06/2007, 12/31/2009, 11/01/2011   Influenza Whole 12/15/2006, 12/06/2007, 12/31/2009   Influenza, High Dose Seasonal PF 11/11/2014, 11/12/2015, 11/15/2016,  11/18/2017, 11/21/2018, 01/02/2021   Influenza,inj,Quad PF,6+ Mos 11/01/2012, 11/09/2013   Influenza-Unspecified 11/01/2011, 11/01/2012, 11/09/2013   Moderna SARS-COV2 Booster Vaccination 01/07/2020   Moderna Sars-Covid-2 Vaccination 02/26/2019, 03/26/2019   PFIZER(Purple Top)SARS-COV-2 Vaccination 12/29/2021   Pneumococcal Conjugate-13 11/09/2013   Pneumococcal Polysaccharide-23 02/23/2004   Td 02/22/2002   Td (Adult),5 Lf Tetanus Toxid, Preservative Free 02/22/2002   Tdap 01/22/2014   Pertinent  Health Maintenance Due  Topic Date Due   INFLUENZA VACCINE  09/23/2022   COLONOSCOPY (Pts 45-36yrs Insurance coverage will need to be confirmed)  Discontinued      12/10/2021   11:45 AM 12/18/2021   10:15 AM 01/25/2022    1:55 PM 02/05/2022    9:54 AM 05/31/2022    3:57 PM  Fall Risk  Falls in the past year? 1 1 1 1 1   Was there an injury with Fall? 1 0 0 0 0  Fall Risk Category Calculator 3 2 2 2 2   Fall Risk Category (Retired) High Moderate Moderate Moderate   (RETIRED) Patient Fall Risk Level Moderate fall risk Moderate fall risk Moderate fall risk Moderate fall risk   Patient at Risk for Falls Due to History of fall(s) History of fall(s);Impaired balance/gait;Impaired mobility History of fall(s);Impaired balance/gait;Impaired mobility History of fall(s);Impaired balance/gait;Impaired mobility History of fall(s);Impaired balance/gait;Impaired mobility;Mental status change  Fall risk Follow up Falls evaluation completed Falls evaluation completed;Education provided;Falls prevention discussed Falls evaluation completed Falls evaluation completed Falls evaluation completed;Education provided;Falls prevention discussed   Functional Status Survey:    Vitals:   06/10/22 1431  BP: 137/73  Pulse: 63  Resp: 18  Temp: 97.6 F (36.4 C)  TempSrc: Temporal  SpO2: 94%  Weight: 137 lb 8 oz (62.4 kg)  Height: 5\' 9"  (1.753 m)   Body mass index is 20.31 kg/m. Physical Exam Vitals reviewed.   Constitutional:      Appearance: Normal appearance.  HENT:     Head: Normocephalic.     Nose: Nose normal.     Mouth/Throat:     Mouth: Mucous membranes are moist.     Pharynx: Oropharynx is clear.  Eyes:     Pupils: Pupils are equal, round, and reactive to light.  Cardiovascular:     Rate and Rhythm: Normal rate and regular rhythm.     Pulses: Normal pulses.     Heart sounds: No murmur heard. Pulmonary:     Effort: Pulmonary effort is normal. No respiratory distress.     Breath sounds: Normal breath sounds. No rales.  Abdominal:     General: Abdomen is flat. Bowel sounds are normal.     Palpations: Abdomen is  soft.  Musculoskeletal:        General: No swelling.     Cervical back: Neck supple.     Comments: Right Wrist Swollen especially around First Metacarpal joint It was warm and Tender  Skin:    General: Skin is warm.  Neurological:     General: No focal deficit present.     Mental Status: He is alert and oriented to person, place, and time.  Psychiatric:        Mood and Affect: Mood normal.        Thought Content: Thought content normal.     Labs reviewed: Recent Labs    07/28/21 1025 12/11/21 0000 02/18/22 0000  NA 141 137 136*  K 4.0 4.2 4.6  CL 109 103 104  CO2 29 28* 24*  GLUCOSE 84  --   --   BUN 24* 21 23*  CREATININE 1.16 1.1 1.0  CALCIUM 9.1 8.7 8.5*   Recent Labs    07/28/21 1025 12/11/21 0000  AST 15 17  ALT 11 16  ALKPHOS 49 68  BILITOT 0.5  --   PROT 6.1*  --   ALBUMIN 3.4* 3.4*   Recent Labs    07/28/21 1025 12/11/21 0000 02/18/22 0000  WBC 5.0 5.1 6.9  NEUTROABS 3.9 3,738.00  --   HGB 11.3* 12.1* 10.7*  HCT 34.3* 36* 32*  MCV 93.5  --   --   PLT 140* 239 182   Lab Results  Component Value Date   TSH 2.42 01/28/2021   Lab Results  Component Value Date   HGBA1C 5.6 07/01/2016   Lab Results  Component Value Date   CHOL 122 12/11/2021   HDL 51 12/11/2021   LDLCALC 55 12/11/2021   LDLDIRECT 159.3 05/17/2007   TRIG  75 12/11/2021   CHOLHDL 2 01/28/2021    Significant Diagnostic Results in last 30 days:  No results found.  Assessment/Plan 1. Pain and swelling of right wrist ? Etiology Seems Inflammatory Arthritis Will do Xray Also Try Prednisone 10 mg QD for 7 days Consider more work up if swelling comes back/ Does not improve Patient is on Cipro for his Urethral stent   Family/ staff Communication:   Labs/tests ordered:

## 2022-06-11 DIAGNOSIS — M25531 Pain in right wrist: Secondary | ICD-10-CM | POA: Diagnosis not present

## 2022-06-16 ENCOUNTER — Encounter: Payer: Self-pay | Admitting: Orthopedic Surgery

## 2022-06-16 ENCOUNTER — Non-Acute Institutional Stay (SKILLED_NURSING_FACILITY): Payer: Medicare HMO | Admitting: Orthopedic Surgery

## 2022-06-16 DIAGNOSIS — M25431 Effusion, right wrist: Secondary | ICD-10-CM | POA: Diagnosis not present

## 2022-06-16 DIAGNOSIS — E039 Hypothyroidism, unspecified: Secondary | ICD-10-CM

## 2022-06-16 DIAGNOSIS — N368 Other specified disorders of urethra: Secondary | ICD-10-CM | POA: Diagnosis not present

## 2022-06-16 DIAGNOSIS — R141 Gas pain: Secondary | ICD-10-CM | POA: Diagnosis not present

## 2022-06-16 DIAGNOSIS — I700 Atherosclerosis of aorta: Secondary | ICD-10-CM

## 2022-06-16 DIAGNOSIS — D509 Iron deficiency anemia, unspecified: Secondary | ICD-10-CM

## 2022-06-16 DIAGNOSIS — C61 Malignant neoplasm of prostate: Secondary | ICD-10-CM | POA: Diagnosis not present

## 2022-06-16 DIAGNOSIS — I10 Essential (primary) hypertension: Secondary | ICD-10-CM

## 2022-06-16 DIAGNOSIS — R69 Illness, unspecified: Secondary | ICD-10-CM | POA: Diagnosis not present

## 2022-06-16 DIAGNOSIS — F01B0 Vascular dementia, moderate, without behavioral disturbance, psychotic disturbance, mood disturbance, and anxiety: Secondary | ICD-10-CM

## 2022-06-16 DIAGNOSIS — M25531 Pain in right wrist: Secondary | ICD-10-CM | POA: Diagnosis not present

## 2022-06-16 DIAGNOSIS — I2510 Atherosclerotic heart disease of native coronary artery without angina pectoris: Secondary | ICD-10-CM

## 2022-06-16 NOTE — Progress Notes (Addendum)
Location:   [facility] Room Number: 28-A Place of Service:  SNF (31) Provider:  Hazle Nordmann, NP    Patient Care Team: Octavia Heir, NP as PCP - General (Adult Health Nurse Practitioner) Verner Chol, [facility] (Inactive) as Pharmacist (Pharmacist) Mahlon Gammon, MD as Consulting Physician (Internal Medicine)  Extended Emergency Contact Information Primary Emergency Contact: Tellez,Geraldine Address: [address] [place]          [place] [address] [place] Home Phone: [phone] Relation: Spouse Secondary Emergency Contact: Colvin Caroli Mobile Phone: [phone] Relation: Daughter  Code Status:  DNR Goals of care: Advanced Directive information    06/16/2022   10:11 AM  Advanced Directives  Does Patient Have a Medical Advance Directive? Yes  Type of Estate agent of [place];Out of facility DNR (pink MOST or yellow form)  Does patient want to make changes to medical advance directive? No - Patient declined  Copy of Healthcare Power of Attorney in Chart? Yes - validated most recent copy scanned in chart (See row information)     Chief Complaint  Patient presents with   Immunizations    Discuss the need for Shingrix vaccine.    Acute Visit    Gas pain    HPI:  Pt is a 89 y.o. male seen today for acute visit due to ongoing gas pain.    PMH: CAD with CABG x 2, HTN, HLD, CHF, mitral valve repair 2015, prostate cancer, TURP 2018, left ureteroscopy with left stent placement 10/2021, Diverticulosis, GERD, GI bleed, hypothyroidism, osteopenia, and falls.    Gas pain- H/o hiatal hernia, he c/o intermittent bloating/ belching and mild abdominal pain for years, he has Gas-X in apartment but not since moving to SNF, he has been chewing on TUMS without relief Right wrist pain- 04/18 increased redness/swelling and pain to right wrist, xray unremarkable, symptoms have resolved with tylenol prn Prostate cancer- followed by  Dr. Merry Lofty, diagnosed 2012, TURP 2019, cancer now affecting left urethra- 04/17 stent exchange> no complications> no hematuria at this time, remains on Lupron injections Q3 months Vascular dementia/cognitive decline- 02/2021 MRI brain indicated small chronic cortical infarcts within left frontal/parietal lobes,moderate cerebral white matter chronic small vessel ischemic disease also noted, BIMS score 8/15 (02/01)> was 11/15 (10/24), no behaviors, not on medication HTN- BUN/creat 23/1.0 02/18/2022, remains on metoprolol CAD- LDL 55 12/11/2021, unable to tolerate aspirin due to past GI bleed, remains on Lipitor Hypothyroidism- TSH 3.72 12/11/2021 remains on levothyroxine Iron deficiency anemia- Hgb 10.7 02/18/2022, remains on ferrous sulfate/ B12  No recent falls or injuries.   Recent blood pressures:  04/23- 135/76  04/16- 137/73  04/09- 90/56  Recent weights:  04/01- 137.5 lbs  03/07- 140.3 lbs  02/01- 142 lbs      Past Medical History:  Diagnosis Date   Complication of anesthesia    "serious cognisance problems since my OR in January" (03/13/2013   Coronary artery disease    Diverticulosis    Hiatal hernia    Hx of echocardiogram    Echo 5/16:  Mild focal basal septal hypertrophy, EF 55%, mild AI, MV repair ok with trivial MR (mean 3 mmHg), mild LAE, mild RAE, PASP 35 mmHg   Hypertension    Hypothyroidism    Irritable bowel syndrome    PAF (paroxysmal atrial fibrillation) 05/03/2014   Personal history of colonic polyps    tubular adenoma   Pleural effusion, bilateral 03/12/2013   Small to moderate, L>R  Prostate cancer 1991   sees Dr. Gala Lewandowsky at [facility], observation only    S/P CABG x 2 02/28/2013   LIMA to LAD, SVG to OM1, EVH via right thigh   S/P Maze operation for atrial fibrillation 02/28/2013   Complete bilateral atrial lesion set using bipolar radiofrequency and cryothermy ablation with clipping of LA appendage   S/P mitral valve repair, maze procedure, and CABG x2  02/28/2013   Complex valvuloplasty including triangular resection of posterior leaflet, 30 mm Sorin Memo 3D ring annuloplasty   Past Surgical History:  Procedure Laterality Date   CARDIAC CATHETERIZATION  2015   CATARACT EXTRACTION, BILATERAL Bilateral 2014   per Dr. Nile Riggs    COLONOSCOPY  2011   per Dr. Jarold Motto, benign polyps, no repeats needed    CORONARY ARTERY BYPASS GRAFT N/A 02/28/2013   Procedure: CORONARY ARTERY BYPASS GRAFTING (CABG);  Surgeon: Purcell Nails, MD;  Location: [facility] OR;  Service: Open Heart Surgery;  Laterality: N/A;  CABG x 2,  using left internal mammary artery and right leg saphenous vein harvested endoscopically   ESOPHAGOGASTRODUODENOSCOPY (EGD) WITH PROPOFOL N/A 01/11/2019   Procedure: ESOPHAGOGASTRODUODENOSCOPY (EGD) WITH PROPOFOL;  Surgeon: Sherrilyn Rist, MD;  Location: [facility] ENDOSCOPY;  Service: Gastroenterology;  Laterality: N/A;   HEMORRHOID SURGERY     INTRAOPERATIVE TRANSESOPHAGEAL ECHOCARDIOGRAM N/A 02/28/2013   Procedure: INTRAOPERATIVE TRANSESOPHAGEAL ECHOCARDIOGRAM;  Surgeon: Purcell Nails, MD;  Location: [facility] OR;  Service: Open Heart Surgery;  Laterality: N/A;   LEFT HEART CATHETERIZATION WITH CORONARY ANGIOGRAM N/A 02/26/2013   Procedure: LEFT HEART CATHETERIZATION WITH CORONARY ANGIOGRAM;  Surgeon: Marykay Lex, MD;  Location: [facility] CATH LAB;  Service: Cardiovascular;  Laterality: N/A;   MAZE N/A 02/28/2013   Procedure: MAZE;  Surgeon: Purcell Nails, MD;  Location: [facility] OR;  Service: Open Heart Surgery;  Laterality: N/A;   MITRAL VALVE REPAIR N/A 02/28/2013   Procedure: MITRAL VALVE REPAIR (MVR);  Surgeon: Purcell Nails, MD;  Location: [facility] OR;  Service: Open Heart Surgery;  Laterality: N/A;   PROSTATE BIOPSY     TRANSURETHRAL RESECTION OF PROSTATE N/A 02/04/2017   Procedure: TRANSURETHRAL RESECTION OF THE PROSTATE (TURP);  Surgeon: Sebastian Ache, MD;  Location: [facility] ORS;  Service: Urology;  Laterality: N/A;    Allergies  Allergen Reactions   Other  Other (See Comments)    ANESTHESIA.... 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 ANESTHESIA.... 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 Occurred post op CABG 2015, required readmission and rehab    Allergies as of 06/16/2022       Reactions   Other Other (See Comments)   ANESTHESIA.... 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 ANESTHESIA.... 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 Occurred post op CABG 2015, required readmission and rehab        Medication List        Accurate as of June 16, 2022 12:42 PM. If you have any questions, ask your nurse or doctor.          STOP taking these medications    leuprolide 22.5 MG injection Commonly known as: LUPRON Stopped by: Octavia Heir, NP       TAKE these medications    acetaminophen 500 MG tablet Commonly known as: TYLENOL Take 2 tablets (1,000 mg total) by mouth 2 (two) times daily as needed.   atorvastatin 10 MG tablet Commonly known as: LIPITOR TAKE 1 TABLET EVERY DAY AT [facility]  Cyanocobalamin 2500 MCG Subl Place 1 tablet under the tongue daily.   ferrous sulfate 325 (65 FE) MG EC tablet TAKE 1 TABLET BY MOUTH EVERY DAY   fish oil-omega-3 fatty acids 1000 MG capsule Take 1 g by mouth every morning.   levothyroxine 50 MCG tablet Commonly known as: SYNTHROID TAKE 1 TABLET BY MOUTH IN THE MORNING   metoprolol tartrate 25 MG tablet Commonly known as: LOPRESSOR TAKE ONE TABLET BY MOUTH TWICE A DAY   nitroGLYCERIN 0.4 MG SL tablet Commonly known as: NITROSTAT Place 1 tablet (0.4 mg total) under the tongue every 5 (five) minutes as needed for chest pain.   predniSONE 10 MG tablet Commonly known as: DELTASONE Take 10 mg by mouth daily.   VITAMIN D3 PO Take 2,000 Units by mouth daily.   zinc oxide 20 % ointment Apply 1 Application topically as needed for irritation.        Review of Systems  Constitutional:  Negative for activity change  and appetite change.  HENT:  Negative for sore throat and trouble swallowing.   Eyes:  Negative for visual disturbance.  Respiratory:  Negative for cough, shortness of breath and wheezing.   Cardiovascular:  Negative for chest pain and leg swelling.  Gastrointestinal:  Positive for abdominal pain. Negative for abdominal distention, blood in stool, constipation, diarrhea, nausea and vomiting.       Bloating/belching  Genitourinary:  Positive for hematuria. Negative for dysuria, flank pain and frequency.  Musculoskeletal:  Positive for gait problem.  Skin:  Negative for wound.  Neurological:  Positive for weakness. Negative for dizziness and light-headedness.  Psychiatric/Behavioral:  Positive for confusion. Negative for dysphoric mood and sleep disturbance. The patient is not nervous/anxious.     Immunization History  Administered Date(s) Administered   Fluad Quad(high Dose 65+) 11/21/2018, 12/25/2019, 01/02/2021, 12/16/2021   Influenza Split 12/15/2006, 12/06/2007, 12/31/2009, 11/01/2011   Influenza Whole 12/15/2006, 12/06/2007, 12/31/2009   Influenza, High Dose Seasonal PF 11/11/2014, 11/12/2015, 11/15/2016, 11/18/2017, 11/21/2018, 01/02/2021   Influenza,inj,Quad PF,6+ Mos 11/01/2012, 11/09/2013   Influenza-Unspecified 11/01/2011, 11/01/2012, 11/09/2013   Moderna SARS-COV2 Booster Vaccination 01/07/2020   Moderna Sars-Covid-2 Vaccination 02/26/2019, 03/26/2019   PFIZER(Purple Top)SARS-COV-2 Vaccination 12/29/2021   Pneumococcal Conjugate-13 11/09/2013   Pneumococcal Polysaccharide-23 02/23/2004   Td 02/22/2002   Td (Adult),5 Lf Tetanus Toxid, Preservative Free 02/22/2002   Tdap 01/22/2014   Pertinent  Health Maintenance Due  Topic Date Due   INFLUENZA VACCINE  09/23/2022   COLONOSCOPY (Pts 45-58yrs Insurance coverage will need to be confirmed)  Discontinued      12/10/2021   11:45 AM 12/18/2021   10:15 AM 01/25/2022    1:55 PM 02/05/2022    9:54 AM 05/31/2022    3:57 PM  Fall  Risk  Falls in the past year? 1 1 1 1 1   Was there an injury with Fall? 1 0 0 0 0  Fall Risk Category Calculator 3 2 2 2 2   Fall Risk Category (Retired) High Moderate Moderate Moderate   (RETIRED) Patient Fall Risk Level Moderate fall risk Moderate fall risk Moderate fall risk Moderate fall risk   Patient at Risk for Falls Due to History of fall(s) History of fall(s);Impaired balance/gait;Impaired mobility History of fall(s);Impaired balance/gait;Impaired mobility History of fall(s);Impaired balance/gait;Impaired mobility History of fall(s);Impaired balance/gait;Impaired mobility;Mental status change  Fall risk Follow up Falls evaluation completed Falls evaluation completed;Education provided;Falls prevention discussed Falls evaluation completed Falls evaluation completed Falls evaluation completed;Education provided;Falls prevention discussed   Functional Status Survey:  Vitals:   06/16/22 1008  BP: 135/76  Pulse: 76  Resp: 20  Temp: 97.6 F (36.4 C)  SpO2: 98%  Weight: 137 lb 8 oz (62.4 kg)  Height:  (1.753 m)   Body mass index is 20.31 kg/m. Physical Exam Vitals reviewed.  Constitutional:      General: He is not in acute distress. HENT:     Head: Normocephalic.     Right Ear: There is no impacted cerumen.     Left Ear: There is no impacted cerumen.     Nose: Nose normal.     Mouth/Throat:     Mouth: Mucous membranes are moist.  Eyes:     General:        Right eye: No discharge.        Left eye: No discharge.  Cardiovascular:     Rate and Rhythm: Normal rate. Rhythm irregular.     Pulses: Normal pulses.     Heart sounds: Normal heart sounds.  Pulmonary:     Effort: Pulmonary effort is normal. No respiratory distress.     Breath sounds: Normal breath sounds. No wheezing or rales.  Abdominal:     General: Bowel sounds are normal. There is no distension.     Palpations: Abdomen is soft. There is no mass.     Tenderness: There is no abdominal tenderness. There  is no guarding or rebound.     Hernia: No hernia is present.  Musculoskeletal:     Cervical back: Neck supple.     Right lower leg: No edema.     Left lower leg: No edema.  Skin:    General: Skin is warm and dry.     Capillary Refill: Capillary refill takes less than 2 seconds.  Neurological:     General: No focal deficit present.     Mental Status: He is alert. Mental status is at baseline.     Motor: Weakness present.     Gait: Gait abnormal.  Psychiatric:        Mood and Affect: Mood normal.     Labs reviewed: Recent Labs    07/28/21 1025 12/11/21 0000 02/18/22 0000  NA 141 137 136*  K 4.0 4.2 4.6  CL 109 103 104  CO2 29 28* 24*  GLUCOSE 84  --   --   BUN 24* 21 23*  CREATININE 1.16 1.1 1.0  CALCIUM 9.1 8.7 8.5*   Recent Labs    07/28/21 1025 12/11/21 0000  AST 15 17  ALT 11 16  ALKPHOS 49 68  BILITOT 0.5  --   PROT 6.1*  --   ALBUMIN 3.4* 3.4*   Recent Labs    07/28/21 1025 12/11/21 0000 02/18/22 0000  WBC 5.0 5.1 6.9  NEUTROABS 3.9 3,738.00  --   HGB 11.3* 12.1* 10.7*  HCT 34.3* 36* 32*  MCV 93.5  --   --   PLT 140* 239 182   Lab Results  Component Value Date   TSH 2.42 01/28/2021   Lab Results  Component Value Date   HGBA1C 5.6 07/01/2016   Lab Results  Component Value Date   CHOL 122 12/11/2021   HDL 51 12/11/2021   LDLCALC 55 12/11/2021   LDLDIRECT 159.3 05/17/2007   TRIG 75 12/11/2021   CHOLHDL 2 01/28/2021    Significant Diagnostic Results in last 30 days:  No results found.  Assessment/Plan 1. Gas pain - ongoing - exam unremarkable - c/o increased belching and bloating -  start simethicone 80 mg chewable BID x 7 days, then BID prn  2. Pain and swelling of right wrist - resolved with Tylenol  - xray unremarkable  3. ADENOCARCINOMA, PROSTATE, GLEASON GRADE 3 - followed by Dr. Merry Lofty  - cont Lupron injections  4. Urethral obstruction - 04/17 urethral stent exchange - no hematuria at this time  5. Moderate  vascular dementia without behavioral disturbance, psychotic disturbance, mood disturbance, or anxiety - no behaviors - ot on medication - cont skilled nursing  6. Essential hypertension - controlled with metoprolol  7. Coronary artery disease involving native coronary artery of native heart without angina pectoris - cont statin  8. Acquired hypothyroidism - cont levothyroxine  9. Iron deficiency anemia, unspecified iron deficiency anemia type - hgb stable - cont ferrous sulfate  10. Aortic atherosclerosis - noted CT abdomen 07/2021    Family/ staff Communication: plan discussed with patient and nurse  Labs/tests ordered:  none

## 2022-06-24 DIAGNOSIS — C61 Malignant neoplasm of prostate: Secondary | ICD-10-CM | POA: Diagnosis not present

## 2022-07-07 ENCOUNTER — Encounter: Payer: Self-pay | Admitting: Orthopedic Surgery

## 2022-07-07 ENCOUNTER — Non-Acute Institutional Stay (SKILLED_NURSING_FACILITY): Payer: Medicare HMO | Admitting: Orthopedic Surgery

## 2022-07-07 DIAGNOSIS — I2510 Atherosclerotic heart disease of native coronary artery without angina pectoris: Secondary | ICD-10-CM | POA: Diagnosis not present

## 2022-07-07 DIAGNOSIS — D509 Iron deficiency anemia, unspecified: Secondary | ICD-10-CM

## 2022-07-07 DIAGNOSIS — I10 Essential (primary) hypertension: Secondary | ICD-10-CM

## 2022-07-07 DIAGNOSIS — C61 Malignant neoplasm of prostate: Secondary | ICD-10-CM | POA: Diagnosis not present

## 2022-07-07 DIAGNOSIS — E039 Hypothyroidism, unspecified: Secondary | ICD-10-CM

## 2022-07-07 DIAGNOSIS — N368 Other specified disorders of urethra: Secondary | ICD-10-CM | POA: Diagnosis not present

## 2022-07-07 DIAGNOSIS — R141 Gas pain: Secondary | ICD-10-CM

## 2022-07-07 DIAGNOSIS — F01B0 Vascular dementia, moderate, without behavioral disturbance, psychotic disturbance, mood disturbance, and anxiety: Secondary | ICD-10-CM | POA: Diagnosis not present

## 2022-07-07 NOTE — Progress Notes (Signed)
Location:   [facility]  Nursing Home Room Number: 28-A Place of Service:  SNF (31) Provider:  Hazle Nordmann, NP    Patient Care Team: Octavia Heir, NP as PCP - General (Adult Health Nurse Practitioner) Verner Chol, [facility] (Inactive) as Pharmacist (Pharmacist) Mahlon Gammon, MD as Consulting Physician (Internal Medicine)  Extended Emergency Contact Information Primary Emergency Contact: Bouch,Geraldine Address: [address] [place]          [place] [address] [place] Home Phone: [phone] Relation: Spouse Secondary Emergency Contact: Colvin Caroli Mobile Phone: [phone] Relation: Daughter  Code Status:  DNR Goals of care: Advanced Directive information    07/07/2022    9:17 AM  Advanced Directives  Does Patient Have a Medical Advance Directive? Yes  Type of Estate agent of [place];Living will;Out of facility DNR (pink MOST or yellow form)  Does patient want to make changes to medical advance directive? No - Patient declined  Copy of Healthcare Power of Attorney in Chart? Yes - validated most recent copy scanned in chart (See row information)     Chief Complaint  Patient presents with   Medical Management of Chronic Issues    Routine Visit.    Immunizations    Discuss the need for Shingrix vaccine, and Covid Booster.    HPI:  Pt is a 89 y.o. male seen today for medical management of chronic diseases.    PMH: CAD with CABG x 2, HTN, HLD, CHF, mitral valve repair 2015, prostate cancer, TURP 2018, left ureteroscopy with left stent placement 10/2021, Diverticulosis, GERD, GI bleed, hypothyroidism, osteopenia, and falls.    Prostate cancer/ Urethral obstruction- followed by Dr. Merry Lofty, diagnosed 2012, TURP 2019, cancer now affecting left urethra- 04/17 stent exchange> no complications> intermittent hematuria, remains on Lupron injections Q3 months Vascular dementia/cognitive decline- 02/2021 MRI brain indicated  small chronic cortical infarcts within left frontal/parietal lobes,moderate cerebral white matter chronic small vessel ischemic disease also noted, has wander guard, BIMS score 15/15 (05/03)> was 8/15 (02/01), no behaviors, not on medication HTN- BUN/creat 23/1.0 02/18/2022, remains on metoprolol CAD- LDL 55 12/11/2021, unable to tolerate aspirin due to past GI bleed, remains on Lipitor Hypothyroidism- TSH 3.72 12/11/2021 remains on levothyroxine Iron deficiency anemia- Hgb 10.7 02/18/2022, remains on ferrous sulfate/ B12 Gas pain- H/o hiatal hernia, he c/o intermittent bloating/ belching and mild abdominal pain, last episode last night> resolved with simethicone prn  Plans to move to [facility] when room available.   No recent falls or injury.   Recent blood pressures:  05/14- 116/61  05/07- 125/71  05/06- 147/72  Recent weights:  05/01- 140.7 lbs  04/01- 137.5 lbs  03/07- 140.3 lbs    Past Medical History:  Diagnosis Date   Complication of anesthesia    "serious cognisance problems since my OR in January" (03/13/2013   Coronary artery disease    Diverticulosis    Hiatal hernia    Hx of echocardiogram    Echo 5/16:  Mild focal basal septal hypertrophy, EF 55%, mild AI, MV repair ok with trivial MR (mean 3 mmHg), mild LAE, mild RAE, PASP 35 mmHg   Hypertension    Hypothyroidism    Irritable bowel syndrome    PAF (paroxysmal atrial fibrillation) (HCC) 05/03/2014   Personal history of colonic polyps    tubular adenoma   Pleural effusion, bilateral 03/12/2013   Small to moderate, L>R   Prostate cancer [facility]) 1991   sees Dr. Gala Lewandowsky at [place], observation  only    S/P CABG x 2 02/28/2013   LIMA to LAD, SVG to OM1, EVH via right thigh   S/P Maze operation for atrial fibrillation 02/28/2013   Complete bilateral atrial lesion set using bipolar radiofrequency and cryothermy ablation with clipping of LA appendage   S/P mitral valve repair, maze procedure, and CABG x2 02/28/2013    Complex valvuloplasty including triangular resection of posterior leaflet, 30 mm Sorin Memo 3D ring annuloplasty   Past Surgical History:  Procedure Laterality Date   CARDIAC CATHETERIZATION  2015   CATARACT EXTRACTION, BILATERAL Bilateral 2014   per Dr. Nile Riggs    COLONOSCOPY  2011   per Dr. Jarold Motto, benign polyps, no repeats needed    CORONARY ARTERY BYPASS GRAFT N/A 02/28/2013   Procedure: CORONARY ARTERY BYPASS GRAFTING (CABG);  Surgeon: Purcell Nails, MD;  Location: [facility] OR;  Service: Open Heart Surgery;  Laterality: N/A;  CABG x 2,  using left internal mammary artery and right leg saphenous vein harvested endoscopically   ESOPHAGOGASTRODUODENOSCOPY (EGD) WITH PROPOFOL N/A 01/11/2019   Procedure: ESOPHAGOGASTRODUODENOSCOPY (EGD) WITH PROPOFOL;  Surgeon: Sherrilyn Rist, MD;  Location: [facility] ENDOSCOPY;  Service: Gastroenterology;  Laterality: N/A;   HEMORRHOID SURGERY     INTRAOPERATIVE TRANSESOPHAGEAL ECHOCARDIOGRAM N/A 02/28/2013   Procedure: INTRAOPERATIVE TRANSESOPHAGEAL ECHOCARDIOGRAM;  Surgeon: Purcell Nails, MD;  Location[facility] OR;  Service: Open Heart Surgery;  Laterality: N/A;   LEFT HEART CATHETERIZATION WITH CORONARY ANGIOGRAM N/A 02/26/2013   Procedure: LEFT HEART CATHETERIZATION WITH CORONARY ANGIOGRAM;  Surgeon: Marykay Lex, MD;  Location: [facility] CATH LAB;  Service: Cardiovascular;  Laterality: N/A;   MAZE N/A 02/28/2013   Procedure: MAZE;  Surgeon: Purcell Nails, MD;  Location: [facility] OR;  Service: Open Heart Surgery;  Laterality: N/A;   MITRAL VALVE REPAIR N/A 02/28/2013   Procedure: MITRAL VALVE REPAIR (MVR);  Surgeon: Purcell Nails, MD;  Location: [facility] OR;  Service: Open Heart Surgery;  Laterality: N/A;   PROSTATE BIOPSY     TRANSURETHRAL RESECTION OF PROSTATE N/A 02/04/2017   Procedure: TRANSURETHRAL RESECTION OF THE PROSTATE (TURP);  Surgeon: Sebastian Ache, MD;  Location: [facility] ORS;  Service: Urology;  Laterality: N/A;    Allergies  Allergen Reactions   Other Other (See  Comments)    ANESTHESIA.... 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 ANESTHESIA.... 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 Occurred post op CABG 2015, required readmission and rehab    Allergies as of 07/07/2022       Reactions   Other Other (See Comments)   ANESTHESIA.... 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 ANESTHESIA.... 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 Occurred post op CABG 2015, required readmission and rehab        Medication List        Accurate as of Jul 07, 2022  9:18 AM. If you have any questions, ask your nurse or doctor.          STOP taking these medications    predniSONE 10 MG tablet Commonly known as: DELTASONE Stopped by: Octavia Heir, NP       TAKE these medications    acetaminophen 500 MG tablet Commonly known as: TYLENOL Take 2 tablets (1,000 mg total) by mouth 2 (two) times daily as needed.   atorvastatin 10 MG tablet Commonly known as: LIPITOR TAKE 1 TABLET EVERY DAY AT 6PM   Cyanocobalamin 2500 MCG Subl Place 1 tablet under the tongue  daily.   ferrous sulfate 325 (65 FE) MG EC tablet TAKE 1 TABLET BY MOUTH EVERY DAY   fish oil-omega-3 fatty acids 1000 MG capsule Take 1 g by mouth every morning.   levothyroxine 50 MCG tablet Commonly known as: SYNTHROID TAKE 1 TABLET BY MOUTH IN THE MORNING   METOPROLOL TARTRATE PO Take 12.5 mg by mouth in the morning and at bedtime. What changed: Another medication with the same name was removed. Continue taking this medication, and follow the directions you see here. Changed by: Octavia Heir, NP   nitroGLYCERIN 0.4 MG SL tablet Commonly known as: NITROSTAT Place 1 tablet (0.4 mg total) under the tongue every 5 (five) minutes as needed for chest pain.   simethicone 80 MG chewable tablet Commonly known as: MYLICON Chew 80 mg by mouth every 12 (twelve) hours as needed for flatulence.   VITAMIN D3 PO Take 2,000 Units  by mouth daily.   zinc oxide 20 % ointment Apply 1 Application topically as needed for irritation.        Review of Systems  Constitutional:  Negative for activity change and appetite change.  HENT:  Negative for congestion and trouble swallowing.   Eyes:  Negative for visual disturbance.  Respiratory:  Negative for cough, shortness of breath and wheezing.   Cardiovascular:  Negative for chest pain and leg swelling.  Gastrointestinal:  Positive for abdominal pain. Negative for abdominal distention, constipation, diarrhea, nausea and vomiting.  Genitourinary:  Negative for dysuria and hematuria.  Musculoskeletal:  Positive for gait problem.  Skin:  Negative for wound.  Neurological:  Positive for weakness. Negative for dizziness and light-headedness.  Psychiatric/Behavioral:  Positive for confusion. Negative for dysphoric mood and sleep disturbance. The patient is not nervous/anxious.     Immunization History  Administered Date(s) Administered   Fluad Quad(high Dose 65+) 11/21/2018, 12/25/2019, 01/02/2021, 12/16/2021   Influenza Split 12/15/2006, 12/06/2007, 12/31/2009, 11/01/2011   Influenza Whole 12/15/2006, 12/06/2007, 12/31/2009   Influenza, High Dose Seasonal PF 11/11/2014, 11/12/2015, 11/15/2016, 11/18/2017, 11/21/2018, 01/02/2021   Influenza,inj,Quad PF,6+ Mos 11/01/2012, 11/09/2013   Influenza-Unspecified 11/01/2011, 11/01/2012, 11/09/2013   Moderna SARS-COV2 Booster Vaccination 01/07/2020   Moderna Sars-Covid-2 Vaccination 02/26/2019, 03/26/2019   PFIZER(Purple Top)SARS-COV-2 Vaccination 12/29/2021   Pneumococcal Conjugate-13 11/09/2013   Pneumococcal Polysaccharide-23 02/23/2004   Td 02/22/2002   Td (Adult),5 Lf Tetanus Toxid, Preservative Free 02/22/2002   Tdap 01/22/2014   Pertinent  Health Maintenance Due  Topic Date Due   INFLUENZA VACCINE  09/23/2022   COLONOSCOPY (Pts 45-55yrs Insurance coverage will need to be confirmed)  Discontinued      12/10/2021    11:45 AM 12/18/2021   10:15 AM 01/25/2022    1:55 PM 02/05/2022    9:54 AM 05/31/2022    3:57 PM  Fall Risk  Falls in the past year? 1 1 1 1 1   Was there an injury with Fall? 1 0 0 0 0  Fall Risk Category Calculator 3 2 2 2 2   Fall Risk Category (Retired) High Moderate Moderate Moderate   (RETIRED) Patient Fall Risk Level Moderate fall risk Moderate fall risk Moderate fall risk Moderate fall risk   Patient at Risk for Falls Due to History of fall(s) History of fall(s);Impaired balance/gait;Impaired mobility History of fall(s);Impaired balance/gait;Impaired mobility History of fall(s);Impaired balance/gait;Impaired mobility History of fall(s);Impaired balance/gait;Impaired mobility;Mental status change  Fall risk Follow up Falls evaluation completed Falls evaluation completed;Education provided;Falls prevention discussed Falls evaluation completed Falls evaluation completed Falls evaluation completed;Education provided;Falls prevention discussed  Functional Status Survey:    Vitals:   07/07/22 0914  BP: 116/61  Pulse: 75  Resp: 18  Temp: 97.6 F (36.4 C)  SpO2: 97%  Weight: 137 lb 8 oz (62.4 kg)  Height: 5\' 9"  (1.753 m)   Body mass index is 20.31 kg/m. Physical Exam Vitals reviewed.  Constitutional:      General: He is not in acute distress. HENT:     Head: Normocephalic.  Eyes:     General:        Right eye: No discharge.        Left eye: No discharge.  Cardiovascular:     Rate and Rhythm: Normal rate and regular rhythm.     Pulses: Normal pulses.     Heart sounds: Normal heart sounds.  Pulmonary:     Effort: Pulmonary effort is normal.     Breath sounds: Normal breath sounds.  Abdominal:     General: Bowel sounds are normal. There is no distension.     Tenderness: There is no abdominal tenderness. There is no guarding.  Musculoskeletal:     Cervical back: Neck supple.     Right lower leg: No edema.     Left lower leg: No edema.  Skin:    General: Skin is warm.      Capillary Refill: Capillary refill takes less than 2 seconds.  Neurological:     General: No focal deficit present.     Mental Status: He is alert and oriented to person, place, and time.     Motor: Weakness present.     Gait: Gait abnormal.     Comments: wheelchair  Psychiatric:        Mood and Affect: Mood normal.     Comments: Some delayed responses/ aphasia during encounter, very pleasant, alert to self/person/place     Labs reviewed: Recent Labs    07/28/21 1025 12/11/21 0000 02/18/22 0000  NA 141 137 136*  K 4.0 4.2 4.6  CL 109 103 104  CO2 29 28* 24*  GLUCOSE 84  --   --   BUN 24* 21 23*  CREATININE 1.16 1.1 1.0  CALCIUM 9.1 8.7 8.5*   Recent Labs    07/28/21 1025 12/11/21 0000  AST 15 17  ALT 11 16  ALKPHOS 49 68  BILITOT 0.5  --   PROT 6.1*  --   ALBUMIN 3.4* 3.4*   Recent Labs    07/28/21 1025 12/11/21 0000 02/18/22 0000  WBC 5.0 5.1 6.9  NEUTROABS 3.9 3,738.00  --   HGB 11.3* 12.1* 10.7*  HCT 34.3* 36* 32*  MCV 93.5  --   --   PLT 140* 239 182   Lab Results  Component Value Date   TSH 2.42 01/28/2021   Lab Results  Component Value Date   HGBA1C 5.6 07/01/2016   Lab Results  Component Value Date   CHOL 122 12/11/2021   HDL 51 12/11/2021   LDLCALC 55 12/11/2021   LDLDIRECT 159.3 05/17/2007   TRIG 75 12/11/2021   CHOLHDL 2 01/28/2021    Significant Diagnostic Results in last 30 days:  No results found.  Assessment/Plan 1. ADENOCARCINOMA, PROSTATE, GLEASON GRADE 3 - followed by Dr. Merry Lofty - cont Lupron injection q 3 months  2. Urethral obstruction - 04/17 urethral sten exchange  3. Moderate vascular dementia without behavioral disturbance, psychotic disturbance, mood disturbance, or anxiety (HCC) - BIMS score 15/15 06/25/2022 - appropriate today - has Wander guard - no behaviors - dependent  with ADLs except feeding - weights stable - not on medication  4. Essential hypertension - controlled with metoprolol  5.  Coronary artery disease involving native coronary artery of native heart without angina pectoris - cont statin  6. Acquired hypothyroidism - cont levothyroxine  7. Iron deficiency anemia, unspecified iron deficiency anemia type - cont ferrous sulfate  8. Gas pain - improved with Gas-X prn    Family/ staff Communication: plan discussed with patient and nurse  Labs/tests ordered:  cbc/diff, cmp, TSH 07/26/2022

## 2022-07-26 DIAGNOSIS — E039 Hypothyroidism, unspecified: Secondary | ICD-10-CM | POA: Diagnosis not present

## 2022-07-26 LAB — BASIC METABOLIC PANEL
BUN: 16 (ref 4–21)
CO2: 26 — AB (ref 13–22)
Chloride: 106 (ref 99–108)
Creatinine: 1 (ref 0.6–1.3)
Glucose: 86
Potassium: 4 mEq/L (ref 3.5–5.1)
Sodium: 137 (ref 137–147)

## 2022-07-26 LAB — COMPREHENSIVE METABOLIC PANEL
Albumin: 3.1 — AB (ref 3.5–5.0)
Calcium: 8.7 (ref 8.7–10.7)
Globulin: 2.3
eGFR: 70

## 2022-07-26 LAB — HEPATIC FUNCTION PANEL
ALT: 9 U/L — AB (ref 10–40)
AST: 14 (ref 14–40)
Alkaline Phosphatase: 55 (ref 25–125)
Bilirubin, Total: 0.6

## 2022-07-26 LAB — CBC AND DIFFERENTIAL
HCT: 35 — AB (ref 41–53)
Hemoglobin: 11.6 — AB (ref 13.5–17.5)
Neutrophils Absolute: 2877
Platelets: 145 10*3/uL — AB (ref 150–400)
WBC: 4.3

## 2022-07-26 LAB — CBC: RBC: 3.76 — AB (ref 3.87–5.11)

## 2022-07-26 LAB — TSH: TSH: 3.01 (ref 0.41–5.90)

## 2022-08-05 DIAGNOSIS — C61 Malignant neoplasm of prostate: Secondary | ICD-10-CM | POA: Diagnosis not present

## 2022-08-10 ENCOUNTER — Encounter: Payer: Self-pay | Admitting: Adult Health

## 2022-08-10 ENCOUNTER — Non-Acute Institutional Stay (SKILLED_NURSING_FACILITY): Payer: Medicare HMO | Admitting: Adult Health

## 2022-08-10 DIAGNOSIS — F01B11 Vascular dementia, moderate, with agitation: Secondary | ICD-10-CM | POA: Diagnosis not present

## 2022-08-10 DIAGNOSIS — I10 Essential (primary) hypertension: Secondary | ICD-10-CM | POA: Diagnosis not present

## 2022-08-10 NOTE — Progress Notes (Signed)
Location:  [facility] Room Number: 28-A Place of Service:  SNF (31) Provider:  Kenard Gower, DNP, FNP-BC  Patient Care Team: Octavia Heir, NP as PCP - General (Adult Health Nurse Practitioner) Verner Chol, [facility] (Inactive) as Pharmacist (Pharmacist) Mahlon Gammon, MD as Consulting Physician (Internal Medicine)  Extended Emergency Contact Information Primary Emergency Contact: Goyne,Geraldine Address: [address] [place]          [place] [address] [place] Home Phone: [phone] Relation: Spouse Secondary Emergency Contact: Colvin Caroli Mobile Phone: [phone] Relation: Daughter  Code Status:  DNR  Goals of care: Advanced Directive information    08/10/2022    9:35 AM  Advanced Directives  Does Patient Have a Medical Advance Directive? Yes  Type of Estate agent of [place];Living will;Out of facility DNR (pink MOST or yellow form)  Does patient want to make changes to medical advance directive? No - Patient declined  Copy of Healthcare Power of Attorney in Chart? Yes - validated most recent copy scanned in chart (See row information)     Chief Complaint  Patient presents with   Acute Visit    Hallucination and increase agitation     HPI:  Pt is a 89 y.o. male seen today for an acute visit regarding hallucination and increase agitation. 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 Latest BIMS score 10/15, ranging as moderate impairment.  BP 132/78, takes Metoprolol for hypertension.  Past Medical History:  Diagnosis Date   Complication of anesthesia    "serious cognisance problems since my OR in January" (03/13/2013   Coronary artery disease    Diverticulosis    Hiatal hernia    Hx of echocardiogram    Echo 5/16:  Mild focal basal septal hypertrophy, EF 55%, mild AI, MV repair ok  with trivial MR (mean 3 mmHg), mild LAE, mild RAE, PASP 35 mmHg   Hypertension    Hypothyroidism    Irritable bowel syndrome    PAF (paroxysmal atrial fibrillation) (HCC) 05/03/2014   Personal history of colonic polyps    tubular adenoma   Pleural effusion, bilateral 03/12/2013   Small to moderate, L>R   Prostate cancer [facility]) 1991   sees Dr. Gala Lewandowsky at [facility], observation only    S/P CABG x 2 02/28/2013   LIMA to LAD, SVG to OM1, EVH via right thigh   S/P Maze operation for atrial fibrillation 02/28/2013   Complete bilateral atrial lesion set using bipolar radiofrequency and cryothermy ablation with clipping of LA appendage   S/P mitral valve repair, maze procedure, and CABG x2 02/28/2013   Complex valvuloplasty including triangular resection of posterior leaflet, 30 mm Sorin Memo 3D ring annuloplasty   Past Surgical History:  Procedure Laterality Date   CARDIAC CATHETERIZATION  2015   CATARACT EXTRACTION, BILATERAL Bilateral 2014   per Dr. Nile Riggs    COLONOSCOPY  2011   per Dr. Jarold Motto, benign polyps, no repeats needed    CORONARY ARTERY BYPASS GRAFT N/A 02/28/2013   Procedure: CORONARY ARTERY BYPASS GRAFTING (CABG);  Surgeon: Purcell Nails, MD;  Location: [facility] OR;  Service: Open Heart Surgery;  Laterality: N/A;  CABG x 2,  using left internal mammary artery and right leg saphenous vein harvested endoscopically   ESOPHAGOGASTRODUODENOSCOPY (EGD) WITH PROPOFOL N/A 01/11/2019   Procedure: ESOPHAGOGASTRODUODENOSCOPY (EGD) WITH PROPOFOL;  Surgeon: Amada Jupiter  L III, MD;  Location: [facility] ENDOSCOPY;  Service: Gastroenterology;  Laterality: N/A;   HEMORRHOID SURGERY     INTRAOPERATIVE TRANSESOPHAGEAL ECHOCARDIOGRAM N/A 02/28/2013   Procedure: INTRAOPERATIVE TRANSESOPHAGEAL ECHOCARDIOGRAM;  Surgeon: Purcell Nails, MD;  Location: [facility] OR;  Service: Open Heart Surgery;  Laterality: N/A;   LEFT HEART CATHETERIZATION WITH CORONARY ANGIOGRAM N/A 02/26/2013   Procedure: LEFT HEART CATHETERIZATION WITH  CORONARY ANGIOGRAM;  Surgeon: Marykay Lex, MD;  Location: [facility] CATH LAB;  Service: Cardiovascular;  Laterality: N/A;   MAZE N/A 02/28/2013   Procedure: MAZE;  Surgeon: Purcell Nails, MD;  Location: [facility] OR;  Service: Open Heart Surgery;  Laterality: N/A;   MITRAL VALVE REPAIR N/A 02/28/2013   Procedure: MITRAL VALVE REPAIR (MVR);  Surgeon: Purcell Nails, MD;  Location: [facility] OR;  Service: Open Heart Surgery;  Laterality: N/A;   PROSTATE BIOPSY     TRANSURETHRAL RESECTION OF PROSTATE N/A 02/04/2017   Procedure: TRANSURETHRAL RESECTION OF THE PROSTATE (TURP);  Surgeon: Sebastian Ache, MD;  Location: [facility] ORS;  Service: Urology;  Laterality: N/A;    Allergies  Allergen Reactions   Other Other (See Comments)    ANESTHESIA.... 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 ANESTHESIA.... 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 Occurred post op CABG 2015, required readmission and rehab    Outpatient Encounter Medications as of 08/10/2022  Medication Sig   acetaminophen (TYLENOL) 500 MG tablet Take 2 tablets (1,000 mg total) by mouth 2 (two) times daily as needed.   atorvastatin (LIPITOR) 10 MG tablet TAKE 1 TABLET EVERY DAY AT 6PM   Cholecalciferol (VITAMIN D3 PO) Take 2,000 Units by mouth daily.   Cyanocobalamin 2500 MCG SUBL Place 1 tablet under the tongue daily.   ferrous sulfate 325 (65 FE) MG EC tablet TAKE 1 TABLET BY MOUTH EVERY DAY   fish oil-omega-3 fatty acids 1000 MG capsule Take 1 g by mouth every morning.   levothyroxine (SYNTHROID) 50 MCG tablet TAKE 1 TABLET BY MOUTH IN THE MORNING   METOPROLOL TARTRATE PO Take 12.5 mg by mouth in the morning and at bedtime.   nitroGLYCERIN (NITROSTAT) 0.4 MG SL tablet Place 1 tablet (0.4 mg total) under the tongue every 5 (five) minutes as needed for chest pain.   simethicone (MYLICON) 80 MG chewable tablet Chew 80 mg by mouth every 12 (twelve) hours as needed for flatulence.   zinc oxide 20 % ointment Apply 1 Application  topically as needed for irritation.   No facility-administered encounter medications on file as of 08/10/2022.    Review of Systems  Unable to obtain due to dementia.    Immunization History  Administered Date(s) Administered   Fluad Quad(high Dose 65+) 11/21/2018, 12/25/2019, 01/02/2021, 12/16/2021   Influenza Split 12/15/2006, 12/06/2007, 12/31/2009, 11/01/2011   Influenza Whole 12/15/2006, 12/06/2007, 12/31/2009   Influenza, High Dose Seasonal PF 11/11/2014, 11/12/2015, 11/15/2016, 11/18/2017, 11/21/2018, 01/02/2021   Influenza,inj,Quad PF,6+ Mos 11/01/2012, 11/09/2013   Influenza-Unspecified 11/01/2011, 11/01/2012, 11/09/2013   Moderna SARS-COV2 Booster Vaccination 01/07/2020   Moderna Sars-Covid-2 Vaccination 02/26/2019, 03/26/2019   PFIZER(Purple Top)SARS-COV-2 Vaccination 12/29/2021   Pneumococcal Conjugate-13 11/09/2013   Pneumococcal Polysaccharide-23 02/23/2004   Td 02/22/2002   Td (Adult),5 Lf Tetanus Toxid, Preservative Free 02/22/2002   Tdap 01/22/2014   Pertinent  Health Maintenance Due  Topic Date Due   INFLUENZA VACCINE  09/23/2022   Colonoscopy  Discontinued      12/10/2021   11:45 AM 12/18/2021   10:15 AM 01/25/2022  1:55 PM 02/05/2022    9:54 AM 05/31/2022    3:57 PM  Fall Risk  Falls in the past year? 1 1 1 1 1   Was there an injury with Fall? 1 0 0 0 0  Fall Risk Category Calculator 3 2 2 2 2   Fall Risk Category (Retired) High Moderate Moderate Moderate   (RETIRED) Patient Fall Risk Level Moderate fall risk Moderate fall risk Moderate fall risk Moderate fall risk   Patient at Risk for Falls Due to History of fall(s) History of fall(s);Impaired balance/gait;Impaired mobility History of fall(s);Impaired balance/gait;Impaired mobility History of fall(s);Impaired balance/gait;Impaired mobility History of fall(s);Impaired balance/gait;Impaired mobility;Mental status change  Fall risk Follow up Falls evaluation completed Falls evaluation completed;Education  provided;Falls prevention discussed Falls evaluation completed Falls evaluation completed Falls evaluation completed;Education provided;Falls prevention discussed     Vitals:   08/10/22 0937  BP: 132/78  Pulse: 67  Resp: 18  Temp: 97.8 F (36.6 C)  SpO2: 95%  Weight: 140 lb 11.2 oz (63.8 kg)  Height: 5\' 9"  (1.753 m)   Body mass index is 20.78 kg/m.  Physical Exam Constitutional:      General: He is not in acute distress.    Appearance: Normal appearance.  HENT:     Head: Normocephalic and atraumatic.     Mouth/Throat:     Mouth: Mucous membranes are moist.  Eyes:     Conjunctiva/sclera: Conjunctivae normal.  Cardiovascular:     Rate and Rhythm: Normal rate and regular rhythm.     Pulses: Normal pulses.     Heart sounds: Normal heart sounds.  Pulmonary:     Effort: Pulmonary effort is normal.     Breath sounds: Normal breath sounds.  Abdominal:     General: Bowel sounds are normal.     Palpations: Abdomen is soft.  Musculoskeletal:        General: No swelling. Normal range of motion.     Cervical back: Normal range of motion.  Skin:    General: Skin is warm and dry.  Neurological:     Mental Status: Mental status is at baseline. He is disoriented.  Psychiatric:        Mood and Affect: Mood normal.        Behavior: Behavior normal.      Labs reviewed: Recent Labs    12/11/21 0000 02/18/22 0000 07/26/22 0000  NA 137 136* 137  K 4.2 4.6 4.0  CL 103 104 106  CO2 28* 24* 26*  BUN 21 23* 16  CREATININE 1.1 1.0 1.0  CALCIUM 8.7 8.5* 8.7   Recent Labs    12/11/21 0000 07/26/22 0000  AST 17 14  ALT 16 9*  ALKPHOS 68 55  ALBUMIN 3.4* 3.1*   Recent Labs    12/11/21 0000 02/18/22 0000 07/26/22 0000  WBC 5.1 6.9 4.3  NEUTROABS 3,738.00  --  2,877.00  HGB 12.1* 10.7* 11.6*  HCT 36* 32* 35*  PLT 239 182 145*   Lab Results  Component Value Date   TSH 3.01 07/26/2022   Lab Results  Component Value Date   HGBA1C 5.6 07/01/2016   Lab Results   Component Value Date   CHOL 122 12/11/2021   HDL 51 12/11/2021   LDLCALC 55 12/11/2021   LDLDIRECT 159.3 05/17/2007   TRIG 75 12/11/2021   CHOLHDL 2 01/28/2021    Significant Diagnostic Results in last 30 days:  No results found.  Assessment/Plan  1. Moderate vascular dementia with agitation (HCC) -  discussed behavior with wife -  will start on Olanzapine 2.5 mg PO Q PM-  wife consents to medication   2. Essential hypertension -  BPs stable -  continue Metoprolol tartrate    Family/ staff Communication: Discussed plan of care with wife and charge nurse.  Labs/tests ordered:  None    Kenard Gower, DNP, MSN, FNP-BC [facility] [phone] (Monday-Friday 8:00 a.m. - 5:00 p.m.) [phone] (after hours)

## 2022-08-11 ENCOUNTER — Non-Acute Institutional Stay (SKILLED_NURSING_FACILITY): Payer: Medicare HMO | Admitting: Orthopedic Surgery

## 2022-08-11 ENCOUNTER — Encounter: Payer: Self-pay | Admitting: Orthopedic Surgery

## 2022-08-11 DIAGNOSIS — C61 Malignant neoplasm of prostate: Secondary | ICD-10-CM | POA: Diagnosis not present

## 2022-08-11 DIAGNOSIS — D696 Thrombocytopenia, unspecified: Secondary | ICD-10-CM

## 2022-08-11 DIAGNOSIS — N368 Other specified disorders of urethra: Secondary | ICD-10-CM | POA: Diagnosis not present

## 2022-08-11 DIAGNOSIS — F01B2 Vascular dementia, moderate, with psychotic disturbance: Secondary | ICD-10-CM

## 2022-08-11 DIAGNOSIS — I2510 Atherosclerotic heart disease of native coronary artery without angina pectoris: Secondary | ICD-10-CM | POA: Diagnosis not present

## 2022-08-11 DIAGNOSIS — D509 Iron deficiency anemia, unspecified: Secondary | ICD-10-CM

## 2022-08-11 DIAGNOSIS — E039 Hypothyroidism, unspecified: Secondary | ICD-10-CM

## 2022-08-11 DIAGNOSIS — I10 Essential (primary) hypertension: Secondary | ICD-10-CM | POA: Diagnosis not present

## 2022-08-11 DIAGNOSIS — D692 Other nonthrombocytopenic purpura: Secondary | ICD-10-CM | POA: Diagnosis not present

## 2022-08-11 NOTE — Progress Notes (Signed)
Location:   [facility] Room Number: 28-A Place of Service:  SNF (31) Provider:  Hazle Nordmann, NP    Patient Care Team: Octavia Heir, NP as PCP - General (Adult Health Nurse Practitioner) Verner Chol, [facility] (Inactive) as Pharmacist (Pharmacist) Mahlon Gammon, MD as Consulting Physician (Internal Medicine)  Extended Emergency Contact Information Primary Emergency Contact: Pedley,Geraldine Address: [address] [place]          [place] [address] [place] Home Phone: [phone] Relation: Spouse Secondary Emergency Contact: Colvin Caroli Mobile Phone: [phone] Relation: Daughter  Code Status:  DNR Goals of care: Advanced Directive information    08/11/2022   10:19 AM  Advanced Directives  Does Patient Have a Medical Advance Directive? Yes  Type of Advance Directive Healthcare Power of Attorney  Does patient want to make changes to medical advance directive? No - Patient declined  Copy of Healthcare Power of Attorney in Chart? Yes - validated most recent copy scanned in chart (See row information)     Chief Complaint  Patient presents with   Medical Management of Chronic Issues    Routine Visit.   Immunizations    Discuss the need for Shingrix vaccine, and Covid Booster.    HPI:  Pt is a 89 y.o. male seen today for medical management of chronic diseases.    PMH: CAD with CABG x 2, HTN, HLD, CHF, mitral valve repair 2015, prostate cancer, TURP 2018, left ureteroscopy with left stent placement 10/2021, Diverticulosis, GERD, GI bleed, hypothyroidism, osteopenia, and falls.    HTN- BUN/creat 16/1.0 07/26/2022, remains on metoprolol CAD- LDL 55 12/11/2021, unable to tolerate aspirin due to past GI bleed, remains on Lipitor Vascular dementia/cognitive decline- 02/2021 MRI brain indicated small chronic cortical infarcts within left frontal/parietal lobes,moderate cerebral white matter chronic small vessel ischemic disease, increased  sundowning with delusions x 5 days> 6/18 started on Zyprexa, recent BIMS score 10/15 (05/03) Prostate cancer/ Urethral obstruction- followed by Dr. Merry Lofty, diagnosed 2012, TURP 2019, cancer now affecting left urethra- 04/17 stent exchange, remains on Lupron injections Q3 months Hypothyroidism- TSH 3. 01 07/26/2022, remains on levothyroxine Iron deficiency anemia- Hgb 10.7 02/18/2022, remains on ferrous sulfate/ B12 Thrombocytopenia- platelets 145 (06/03)> was 182 (12/28)  Recent blood pressures:  06/18- 124/66  06/11- 132/78  06/04- 117/65  Recent weights:  06/01- 140.7 lbs  04/01- 137.5 lbs  03/07- 140.3 lbs   Past Medical History:  Diagnosis Date   Complication of anesthesia    "serious cognisance problems since my OR in January" (03/13/2013   Coronary artery disease    Diverticulosis    Hiatal hernia    Hx of echocardiogram    Echo 5/16:  Mild focal basal septal hypertrophy, EF 55%, mild AI, MV repair ok with trivial MR (mean 3 mmHg), mild LAE, mild RAE, PASP 35 mmHg   Hypertension    Hypothyroidism    Irritable bowel syndrome    PAF (paroxysmal atrial fibrillation) (HCC) 05/03/2014   Personal history of colonic polyps    tubular adenoma   Pleural effusion, bilateral 03/12/2013   Small to moderate, L>R   Prostate cancer [facility]) 1991   sees Dr. Gala Lewandowsky at [facility], observation only    S/P CABG x 2 02/28/2013   LIMA to LAD, SVG to OM1, EVH via right thigh   S/P Maze operation for atrial fibrillation 02/28/2013   Complete bilateral atrial lesion set using bipolar radiofrequency and cryothermy ablation with clipping of LA appendage  S/P mitral valve repair, maze procedure, and CABG x2 02/28/2013   Complex valvuloplasty including triangular resection of posterior leaflet, 30 mm Sorin Memo 3D ring annuloplasty   Past Surgical History:  Procedure Laterality Date   CARDIAC CATHETERIZATION  2015   CATARACT EXTRACTION, BILATERAL Bilateral 2014   per Dr. Nile Riggs    COLONOSCOPY  2011    per Dr. Jarold Motto, benign polyps, no repeats needed    CORONARY ARTERY BYPASS GRAFT N/A 02/28/2013   Procedure: CORONARY ARTERY BYPASS GRAFTING (CABG);  Surgeon: Purcell Nails, MD;  Location: [facility] OR;  Service: Open Heart Surgery;  Laterality: N/A;  CABG x 2,  using left internal mammary artery and right leg saphenous vein harvested endoscopically   ESOPHAGOGASTRODUODENOSCOPY (EGD) WITH PROPOFOL N/A 01/11/2019   Procedure: ESOPHAGOGASTRODUODENOSCOPY (EGD) WITH PROPOFOL;  Surgeon: Sherrilyn Rist, MD;  Location: [facility];  Service: Gastroenterology;  Laterality: N/A;   HEMORRHOID SURGERY     INTRAOPERATIVE TRANSESOPHAGEAL ECHOCARDIOGRAM N/A 02/28/2013   Procedure: INTRAOPERATIVE TRANSESOPHAGEAL ECHOCARDIOGRAM;  Surgeon: Purcell Nails, MD;  Location[facility] OR;  Service: Open Heart Surgery;  Laterality: N/A;   LEFT HEART CATHETERIZATION WITH CORONARY ANGIOGRAM N/A 02/26/2013   Procedure: LEFT HEART CATHETERIZATION WITH CORONARY ANGIOGRAM;  Surgeon: Marykay Lex, MD;  Location: [facility] CATH LAB;  Service: Cardiovascular;  Laterality: N/A;   MAZE N/A 02/28/2013   Procedure: MAZE;  Surgeon: Purcell Nails, MD;  Location: [facility] OR;  Service: Open Heart Surgery;  Laterality: N/A;   MITRAL VALVE REPAIR N/A 02/28/2013   Procedure: MITRAL VALVE REPAIR (MVR);  Surgeon: Purcell Nails, MD;  Location: [facility] OR;  Service: Open Heart Surgery;  Laterality: N/A;   PROSTATE BIOPSY     TRANSURETHRAL RESECTION OF PROSTATE N/A 02/04/2017   Procedure: TRANSURETHRAL RESECTION OF THE PROSTATE (TURP);  Surgeon: Sebastian Ache, MD;  Location: [facility] ORS;  Service: Urology;  Laterality: N/A;    Allergies  Allergen Reactions   Other Other (See Comments)    ANESTHESIA.... 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 ANESTHESIA.... 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 Occurred post op CABG 2015, required readmission and rehab    Allergies as of 08/11/2022       Reactions   Other Other (See  Comments)   ANESTHESIA.... 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 ANESTHESIA.... 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 Occurred post op CABG 2015, required readmission and rehab        Medication List        Accurate as of August 11, 2022 10:19 AM. If you have any questions, ask your nurse or doctor.          acetaminophen 500 MG tablet Commonly known as: TYLENOL Take 2 tablets (1,000 mg total) by mouth 2 (two) times daily as needed.   atorvastatin 10 MG tablet Commonly known as: LIPITOR TAKE 1 TABLET EVERY DAY AT 6PM   Cyanocobalamin 2500 MCG Subl Place 1 tablet under the tongue daily.   ferrous sulfate 325 (65 FE) MG EC tablet TAKE 1 TABLET BY MOUTH EVERY DAY   fish oil-omega-3 fatty acids 1000 MG capsule Take 1 g by mouth every morning.   levothyroxine 50 MCG tablet Commonly known as: SYNTHROID TAKE 1 TABLET BY MOUTH IN THE MORNING   METOPROLOL TARTRATE PO Take 12.5 mg by mouth in the morning and at bedtime.   nitroGLYCERIN 0.4 MG SL tablet Commonly known as: NITROSTAT Place 1 tablet (0.4 mg  total) under the tongue every 5 (five) minutes as needed for chest pain.   OLANZapine 2.5 MG tablet Commonly known as: ZYPREXA Take 2.5 mg by mouth daily.   simethicone 80 MG chewable tablet Commonly known as: MYLICON Chew 80 mg by mouth every 12 (twelve) hours as needed for flatulence.   VITAMIN D3 PO Take 2,000 Units by mouth daily.   zinc oxide 20 % ointment Apply 1 Application topically as needed for irritation.        Review of Systems  Unable to perform ROS: Dementia    Immunization History  Administered Date(s) Administered   Fluad Quad(high Dose 65+) 11/21/2018, 12/25/2019, 01/02/2021, 12/16/2021   Influenza Split 12/15/2006, 12/06/2007, 12/31/2009, 11/01/2011   Influenza Whole 12/15/2006, 12/06/2007, 12/31/2009   Influenza, High Dose Seasonal PF 11/11/2014, 11/12/2015, 11/15/2016, 11/18/2017, 11/21/2018,  01/02/2021   Influenza,inj,Quad PF,6+ Mos 11/01/2012, 11/09/2013   Influenza-Unspecified 11/01/2011, 11/01/2012, 11/09/2013   Moderna SARS-COV2 Booster Vaccination 01/07/2020   Moderna Sars-Covid-2 Vaccination 02/26/2019, 03/26/2019   PFIZER(Purple Top)SARS-COV-2 Vaccination 12/29/2021   Pneumococcal Conjugate-13 11/09/2013   Pneumococcal Polysaccharide-23 02/23/2004   Td 02/22/2002   Td (Adult),5 Lf Tetanus Toxid, Preservative Free 02/22/2002   Tdap 01/22/2014   Pertinent  Health Maintenance Due  Topic Date Due   INFLUENZA VACCINE  09/23/2022   Colonoscopy  Discontinued      12/10/2021   11:45 AM 12/18/2021   10:15 AM 01/25/2022    1:55 PM 02/05/2022    9:54 AM 05/31/2022    3:57 PM  Fall Risk  Falls in the past year? 1 1 1 1 1   Was there an injury with Fall? 1 0 0 0 0  Fall Risk Category Calculator 3 2 2 2 2   Fall Risk Category (Retired) High Moderate Moderate Moderate   (RETIRED) Patient Fall Risk Level Moderate fall risk Moderate fall risk Moderate fall risk Moderate fall risk   Patient at Risk for Falls Due to History of fall(s) History of fall(s);Impaired balance/gait;Impaired mobility History of fall(s);Impaired balance/gait;Impaired mobility History of fall(s);Impaired balance/gait;Impaired mobility History of fall(s);Impaired balance/gait;Impaired mobility;Mental status change  Fall risk Follow up Falls evaluation completed Falls evaluation completed;Education provided;Falls prevention discussed Falls evaluation completed Falls evaluation completed Falls evaluation completed;Education provided;Falls prevention discussed   Functional Status Survey:    Vitals:   08/11/22 1012  BP: 124/66  Pulse: 67  Resp: 18  Temp: 97.7 F (36.5 C)  SpO2: 97%  Weight: 140 lb 11.2 oz (63.8 kg)  Height: 5\' 9"  (1.753 m)   Body mass index is 20.78 kg/m. Physical Exam Vitals reviewed.  Constitutional:      General: He is not in acute distress. HENT:     Head: Normocephalic.      Right Ear: There is no impacted cerumen.     Left Ear: There is no impacted cerumen.     Nose: Nose normal.     Mouth/Throat:     Mouth: Mucous membranes are moist.  Eyes:     General:        Right eye: No discharge.        Left eye: No discharge.  Cardiovascular:     Rate and Rhythm: Normal rate and regular rhythm.     Pulses: Normal pulses.     Heart sounds: Normal heart sounds.  Pulmonary:     Effort: Pulmonary effort is normal. No respiratory distress.     Breath sounds: Normal breath sounds. No wheezing.  Abdominal:     General: Bowel sounds are normal.  Palpations: Abdomen is soft.  Musculoskeletal:     Cervical back: Neck supple.     Right lower leg: No edema.     Left lower leg: No edema.  Skin:    General: Skin is warm.     Comments: Non tender purple lesions to upper extremities, vary in size, no skin breakdown  Neurological:     General: No focal deficit present.     Mental Status: He is alert. Mental status is at baseline.     Motor: Weakness present.     Gait: Gait abnormal.     Comments: wheelchair  Psychiatric:        Mood and Affect: Mood normal.     Comments: Very pleasant, alert to self/familiar face, place, follows commands     Labs reviewed: Recent Labs    12/11/21 0000 02/18/22 0000 07/26/22 0000  NA 137 136* 137  K 4.2 4.6 4.0  CL 103 104 106  CO2 28* 24* 26*  BUN 21 23* 16  CREATININE 1.1 1.0 1.0  CALCIUM 8.7 8.5* 8.7   Recent Labs    12/11/21 0000 07/26/22 0000  AST 17 14  ALT 16 9*  ALKPHOS 68 55  ALBUMIN 3.4* 3.1*   Recent Labs    12/11/21 0000 02/18/22 0000 07/26/22 0000  WBC 5.1 6.9 4.3  NEUTROABS 3,738.00  --  2,877.00  HGB 12.1* 10.7* 11.6*  HCT 36* 32* 35*  PLT 239 182 145*   Lab Results  Component Value Date   TSH 3.01 07/26/2022   Lab Results  Component Value Date   HGBA1C 5.6 07/01/2016   Lab Results  Component Value Date   CHOL 122 12/11/2021   HDL 51 12/11/2021   LDLCALC 55 12/11/2021    LDLDIRECT 159.3 05/17/2007   TRIG 75 12/11/2021   CHOLHDL 2 01/28/2021    Significant Diagnostic Results in last 30 days:  No results found.  Assessment/Plan 1. Essential hypertension - controlled with metoprolol  2. Coronary artery disease involving native coronary artery of native heart without angina pectoris - LDL stable - cont statin  3. Moderate vascular dementia with psychotic disturbance (HCC) - increased sundowning/delusions x 5 days - 06/18 started on Zyprexa  - dependent with ADLs except feeding - weights stable - cont skilled nursing  4. ADENOCARCINOMA, PROSTATE, GLEASON GRADE 3 - followed by Dr. Merry Lofty - cont Lupron injections every 3 months  5. Urethral obstruction - 04/17 urethral stent exchange  6. Acquired hypothyroidism - TSH stable - cont levothyroxine  7. Iron deficiency anemia, unspecified iron deficiency anemia type - hgb stable - cont ferrous sulfate  8. Thrombocytopenia (HCC) - platelets 145 07/26/2022  9. Senile purpura (HCC) - involves upper extremities - education given to patient and staff    Family/ staff Communication: plan discussed with patient and nurse  Labs/tests ordered: none

## 2022-08-13 ENCOUNTER — Non-Acute Institutional Stay (SKILLED_NURSING_FACILITY): Payer: Medicare HMO | Admitting: Orthopedic Surgery

## 2022-08-13 ENCOUNTER — Encounter: Payer: Self-pay | Admitting: Orthopedic Surgery

## 2022-08-13 DIAGNOSIS — F01B11 Vascular dementia, moderate, with agitation: Secondary | ICD-10-CM

## 2022-08-13 DIAGNOSIS — F22 Delusional disorders: Secondary | ICD-10-CM

## 2022-08-13 MED ORDER — HYDROXYZINE PAMOATE 25 MG PO CAPS
25.0000 mg | ORAL_CAPSULE | Freq: Two times a day (BID) | ORAL | 0 refills | Status: DC | PRN
Start: 2022-08-13 — End: 2022-08-20

## 2022-08-13 NOTE — Progress Notes (Addendum)
Location:   [facility]  Nursing Home Room Number: 28-A Place of Service:  SNF (31) Provider:  Hazle Nordmann, NP    Patient Care Team: Octavia Heir, NP as PCP - General (Adult Health Nurse Practitioner) Verner Chol, [facility] (Inactive) as Pharmacist (Pharmacist) Mahlon Gammon, MD as Consulting Physician (Internal Medicine)  Extended Emergency Contact Information Primary Emergency Contact: Goffredo,Geraldine Address: [address] [place]          [place] [address] [place] Home Phone: [phone] Relation: Spouse Secondary Emergency Contact: Colvin Caroli Mobile Phone: [phone] Relation: Daughter  Code Status:  DNR Goals of care: Advanced Directive information    08/13/2022    9:39 AM  Advanced Directives  Does Patient Have a Medical Advance Directive? Yes  Type of Estate agent of [place];Out of facility DNR (pink MOST or yellow form)  Does patient want to make changes to medical advance directive? No - Patient declined  Copy of Healthcare Power of Attorney in Chart? Yes - validated most recent copy scanned in chart (See row information)     Chief Complaint  Patient presents with   Acute Visit    Delusions     HPI:  Pt is a 89 y.o. male seen today for an acute visit due to ongoing delusions.   PMH: CAD with CABG x 2, HTN, HLD, CHF, mitral valve repair 2015, prostate cancer, TURP 2018, left ureteroscopy with left stent placement 10/2021, Diverticulosis, GERD, GI bleed, hypothyroidism, osteopenia, and falls.   Increased delusion occurring in the late afternoon/ evening x 1 week. 06/18 he was started on Zyprexa 2.5 mg daily. Last night, he became increasingly agitation. 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 He wanted to call police and [employer]. He had a period of increased yelling as well. Wife was able to calm him some. Son was called as well. He was given ativan 0.5 mg once by on call provider  and behaviors subsided.    Past Medical History:  Diagnosis Date   Complication of anesthesia    "serious cognisance problems since my OR in January" (03/13/2013   Coronary artery disease    Diverticulosis    Hiatal hernia    Hx of echocardiogram    Echo 5/16:  Mild focal basal septal hypertrophy, EF 55%, mild AI, MV repair ok with trivial MR (mean 3 mmHg), mild LAE, mild RAE, PASP 35 mmHg   Hypertension    Hypothyroidism    Irritable bowel syndrome    PAF (paroxysmal atrial fibrillation) (HCC) 05/03/2014   Personal history of colonic polyps    tubular adenoma   Pleural effusion, bilateral 03/12/2013   Small to moderate, L>R   Prostate cancer [facility]) 1991   sees Dr. Gala Lewandowsky at [facility], observation only    S/P CABG x 2 02/28/2013   LIMA to LAD, SVG to OM1, EVH via right thigh   S/P Maze operation for atrial fibrillation 02/28/2013   Complete bilateral atrial lesion set using bipolar radiofrequency and cryothermy ablation with clipping of LA appendage   S/P mitral valve repair, maze procedure, and CABG x2 02/28/2013   Complex valvuloplasty including triangular resection of posterior leaflet, 30 mm Sorin Memo 3D ring annuloplasty   Past Surgical History:  Procedure Laterality Date   CARDIAC CATHETERIZATION  2015   CATARACT EXTRACTION, BILATERAL Bilateral 2014   per Dr. Nile Riggs    COLONOSCOPY  2011   per Dr. Jarold Motto, benign polyps, no  repeats needed    CORONARY ARTERY BYPASS GRAFT N/A 02/28/2013   Procedure: CORONARY ARTERY BYPASS GRAFTING (CABG);  Surgeon: Purcell Nails, MD;  Location: [facility] OR;  Service: Open Heart Surgery;  Laterality: N/A;  CABG x 2,  using left internal mammary artery and right leg saphenous vein harvested endoscopically   ESOPHAGOGASTRODUODENOSCOPY (EGD) WITH PROPOFOL N/A 01/11/2019   Procedure: ESOPHAGOGASTRODUODENOSCOPY (EGD) WITH PROPOFOL;  Surgeon: Sherrilyn Rist, MD;  Location: [facility] ENDOSCOPY;  Service: Gastroenterology;  Laterality: N/A;   HEMORRHOID SURGERY      INTRAOPERATIVE TRANSESOPHAGEAL ECHOCARDIOGRAM N/A 02/28/2013   Procedure: INTRAOPERATIVE TRANSESOPHAGEAL ECHOCARDIOGRAM;  Surgeon: Purcell Nails, MD;  Location: [facility] OR;  Service: Open Heart Surgery;  Laterality: N/A;   LEFT HEART CATHETERIZATION WITH CORONARY ANGIOGRAM N/A 02/26/2013   Procedure: LEFT HEART CATHETERIZATION WITH CORONARY ANGIOGRAM;  Surgeon: Marykay Lex, MD;  Location: [facility] CATH LAB;  Service: Cardiovascular;  Laterality: N/A;   MAZE N/A 02/28/2013   Procedure: MAZE;  Surgeon: Purcell Nails, MD;  Location: [facility] OR;  Service: Open Heart Surgery;  Laterality: N/A;   MITRAL VALVE REPAIR N/A 02/28/2013   Procedure: MITRAL VALVE REPAIR (MVR);  Surgeon: Purcell Nails, MD;  Location: [facility] OR;  Service: Open Heart Surgery;  Laterality: N/A;   PROSTATE BIOPSY     TRANSURETHRAL RESECTION OF PROSTATE N/A 02/04/2017   Procedure: TRANSURETHRAL RESECTION OF THE PROSTATE (TURP);  Surgeon: Sebastian Ache, MD;  Location: [facility] ORS;  Service: Urology;  Laterality: N/A;    Allergies  Allergen Reactions   Other Other (See Comments)    ANESTHESIA.... 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 ANESTHESIA.... 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 Occurred post op CABG 2015, required readmission and rehab    Allergies as of 08/13/2022       Reactions   Other Other (See Comments)   ANESTHESIA.... 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 ANESTHESIA.... 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 Occurred post op CABG 2015, required readmission and rehab        Medication List        Accurate as of August 13, 2022  9:40 AM. If you have any questions, ask your nurse or doctor.          acetaminophen 500 MG tablet Commonly known as: TYLENOL Take 2 tablets (1,000 mg total) by mouth 2 (two) times daily as needed.   atorvastatin 10 MG tablet Commonly known as: LIPITOR TAKE 1 TABLET EVERY DAY AT 6PM   Cyanocobalamin 2500 MCG  Subl Place 1 tablet under the tongue daily.   ferrous sulfate 325 (65 FE) MG EC tablet TAKE 1 TABLET BY MOUTH EVERY DAY   fish oil-omega-3 fatty acids 1000 MG capsule Take 1 g by mouth every morning.   levothyroxine 50 MCG tablet Commonly known as: SYNTHROID TAKE 1 TABLET BY MOUTH IN THE MORNING   METOPROLOL TARTRATE PO Take 12.5 mg by mouth in the morning and at bedtime.   nitroGLYCERIN 0.4 MG SL tablet Commonly known as: NITROSTAT Place 1 tablet (0.4 mg total) under the tongue every 5 (five) minutes as needed for chest pain.   OLANZapine 2.5 MG tablet Commonly known as: ZYPREXA Take 2.5 mg by mouth daily.   simethicone 80 MG chewable tablet Commonly known as: MYLICON Chew 80 mg by mouth every 12 (twelve) hours as needed for flatulence.   VITAMIN D3 PO Take 2,000 Units by mouth daily.   zinc  oxide 20 % ointment Apply 1 Application topically as needed for irritation.        Review of Systems  Unable to perform ROS: Dementia    Immunization History  Administered Date(s) Administered   Fluad Quad(high Dose 65+) 11/21/2018, 12/25/2019, 01/02/2021, 12/16/2021   Influenza Split 12/15/2006, 12/06/2007, 12/31/2009, 11/01/2011   Influenza Whole 12/15/2006, 12/06/2007, 12/31/2009   Influenza, High Dose Seasonal PF 11/11/2014, 11/12/2015, 11/15/2016, 11/18/2017, 11/21/2018, 01/02/2021   Influenza,inj,Quad PF,6+ Mos 11/01/2012, 11/09/2013   Influenza-Unspecified 11/01/2011, 11/01/2012, 11/09/2013   Moderna SARS-COV2 Booster Vaccination 01/07/2020   Moderna Sars-Covid-2 Vaccination 02/26/2019, 03/26/2019   PFIZER(Purple Top)SARS-COV-2 Vaccination 12/29/2021   Pneumococcal Conjugate-13 11/09/2013   Pneumococcal Polysaccharide-23 02/23/2004   Td 02/22/2002   Td (Adult),5 Lf Tetanus Toxid, Preservative Free 02/22/2002   Tdap 01/22/2014   Pertinent  Health Maintenance Due  Topic Date Due   INFLUENZA VACCINE  09/23/2022   Colonoscopy  Discontinued      12/18/2021    10:15 AM 01/25/2022    1:55 PM 02/05/2022    9:54 AM 05/31/2022    3:57 PM 08/11/2022    2:32 PM  Fall Risk  Falls in the past year? 1 1 1 1  0  Was there an injury with Fall? 0 0 0 0 0  Fall Risk Category Calculator 2 2 2 2  0  Fall Risk Category (Retired) Moderate Moderate Moderate    (RETIRED) Patient Fall Risk Level Moderate fall risk Moderate fall risk Moderate fall risk    Patient at Risk for Falls Due to History of fall(s);Impaired balance/gait;Impaired mobility History of fall(s);Impaired balance/gait;Impaired mobility History of fall(s);Impaired balance/gait;Impaired mobility History of fall(s);Impaired balance/gait;Impaired mobility;Mental status change History of fall(s);Impaired balance/gait;Impaired mobility;Mental status change  Fall risk Follow up Falls evaluation completed;Education provided;Falls prevention discussed Falls evaluation completed Falls evaluation completed Falls evaluation completed;Education provided;Falls prevention discussed Falls evaluation completed;Education provided;Falls prevention discussed   Functional Status Survey:    Vitals:   08/13/22 0936  BP: 124/66  Pulse: 67  Resp: 18  Temp: 97.7 F (36.5 C)  SpO2: 97%  Weight: 140 lb 11.2 oz (63.8 kg)  Height: 5\' 9"  (1.753 m)   Body mass index is 20.78 kg/m. Physical Exam Vitals reviewed.  Constitutional:      General: He is not in acute distress. HENT:     Head: Normocephalic.  Eyes:     General:        Right eye: No discharge.        Left eye: No discharge.     Pupils: Pupils are equal, round, and reactive to light.  Cardiovascular:     Rate and Rhythm: Normal rate and regular rhythm.     Pulses: Normal pulses.     Heart sounds: Normal heart sounds.  Pulmonary:     Effort: Pulmonary effort is normal. No respiratory distress.     Breath sounds: Normal breath sounds. No wheezing or rales.  Abdominal:     General: Bowel sounds are normal.     Palpations: Abdomen is soft.  Musculoskeletal:      Cervical back: Neck supple.     Right lower leg: No edema.     Left lower leg: No edema.  Skin:    General: Skin is warm.     Capillary Refill: Capillary refill takes less than 2 seconds.  Neurological:     General: No focal deficit present.     Mental Status: He is alert. Mental status is at baseline.     Motor: Weakness  present.     Gait: Gait abnormal.     Comments: wheelchair  Psychiatric:        Mood and Affect: Mood normal.     Comments: Follows commands, alert to self/familiar face     Labs reviewed: Recent Labs    12/11/21 0000 02/18/22 0000 07/26/22 0000  NA 137 136* 137  K 4.2 4.6 4.0  CL 103 104 106  CO2 28* 24* 26*  BUN 21 23* 16  CREATININE 1.1 1.0 1.0  CALCIUM 8.7 8.5* 8.7   Recent Labs    12/11/21 0000 07/26/22 0000  AST 17 14  ALT 16 9*  ALKPHOS 68 55  ALBUMIN 3.4* 3.1*   Recent Labs    12/11/21 0000 02/18/22 0000 07/26/22 0000  WBC 5.1 6.9 4.3  NEUTROABS 3,738.00  --  2,877.00  HGB 12.1* 10.7* 11.6*  HCT 36* 32* 35*  PLT 239 182 145*   Lab Results  Component Value Date   TSH 3.01 07/26/2022   Lab Results  Component Value Date   HGBA1C 5.6 07/01/2016   Lab Results  Component Value Date   CHOL 122 12/11/2021   HDL 51 12/11/2021   LDLCALC 55 12/11/2021   LDLDIRECT 159.3 05/17/2007   TRIG 75 12/11/2021   CHOLHDL 2 01/28/2021    Significant Diagnostic Results in last 30 days:  No results found.  Assessment/Plan  1. Delusion (HCC) - see below  2. 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

## 2022-08-17 ENCOUNTER — Encounter: Payer: Self-pay | Admitting: Nurse Practitioner

## 2022-08-17 ENCOUNTER — Non-Acute Institutional Stay (SKILLED_NURSING_FACILITY): Payer: Medicare HMO | Admitting: Nurse Practitioner

## 2022-08-17 DIAGNOSIS — C61 Malignant neoplasm of prostate: Secondary | ICD-10-CM | POA: Diagnosis not present

## 2022-08-17 DIAGNOSIS — E039 Hypothyroidism, unspecified: Secondary | ICD-10-CM | POA: Diagnosis not present

## 2022-08-17 DIAGNOSIS — D6489 Other specified anemias: Secondary | ICD-10-CM

## 2022-08-17 DIAGNOSIS — R41841 Cognitive communication deficit: Secondary | ICD-10-CM | POA: Diagnosis not present

## 2022-08-17 DIAGNOSIS — I10 Essential (primary) hypertension: Secondary | ICD-10-CM | POA: Diagnosis not present

## 2022-08-17 DIAGNOSIS — F03C11 Unspecified dementia, severe, with agitation: Secondary | ICD-10-CM

## 2022-08-17 DIAGNOSIS — F03911 Unspecified dementia, unspecified severity, with agitation: Secondary | ICD-10-CM | POA: Insufficient documentation

## 2022-08-17 NOTE — Assessment & Plan Note (Signed)
Blood pressure is controlled, takes Metoprolol. Bun/creat 16/1.0 07/26/22

## 2022-08-17 NOTE — Assessment & Plan Note (Signed)
on Iron, Vit B12, Hgb 11.6 07/26/22

## 2022-08-17 NOTE — Assessment & Plan Note (Signed)
takes Levothyroxine, TSH 3.01 07/26/22

## 2022-08-17 NOTE — Assessment & Plan Note (Addendum)
Prostate cancer, followed by urology. Urethral obstruction, stent changes q59mos

## 2022-08-17 NOTE — Assessment & Plan Note (Signed)
started Olanzapine 2.5mg  since 08/11/22 extremely confused, agitated during night, wanted to call the police for been held at [facility] against his will, staff question of if AR of Zyprexa Unable to obtain HPI 2/2 dementia UA C/S to r/o UTI, prn Lorazepam 0.5mg  every day x 14 days, observe.

## 2022-08-17 NOTE — Progress Notes (Signed)
Location:   [facility] Room Number: NO/28/A Place of Service:  SNF (31) Provider: [facility] Demarko Zeimet NP  Octavia Heir, NP  Patient Care Team: Octavia Heir, NP as PCP - General (Adult Health Nurse Practitioner) Verner Chol, [facility] (Inactive) as Pharmacist (Pharmacist) Mahlon Gammon, MD as Consulting Physician (Internal Medicine)  Extended Emergency Contact Information Primary Emergency Contact: Delellis,Geraldine Address: [address] [place]          [place] [address] [place] Home Phone: [phone] Relation: Spouse Secondary Emergency Contact: Colvin Caroli Mobile Phone: [phone] Relation: Daughter  Code Status:  DNR Goals of care: Advanced Directive information    08/13/2022    9:39 AM  Advanced Directives  Does Patient Have a Medical Advance Directive? Yes  Type of Estate agent of [place];Out of facility DNR (pink MOST or yellow form)  Does patient want to make changes to medical advance directive? No - Patient declined  Copy of Healthcare Power of Attorney in Chart? Yes - validated most recent copy scanned in chart (See row information)     Chief Complaint  Patient presents with   Acute Visit    Patient is being seen for confusion    HPI:  Pt is a 89 y.o. male seen today for an acute visit for extremely confused, agitated during night, wanted to call the police for been held at [facility] against his will  Dementia, started Olanzapine 2.5mg  since 08/11/22  HTN, takes Metoprolol. Bun/creat 16/1.0 07/26/22  Hypothyroidism, takes Levothyroxine, TSH 3.01 07/26/22  IDA on Iron, Vit B12, Hgb 11.6 07/26/22  Prostate cancer, followed by urology  Urethral obstruction, stent changes q77mos     Past Medical History:  Diagnosis Date   Complication of anesthesia    "serious cognisance problems since my OR in January" (03/13/2013   Coronary artery disease    Diverticulosis    Hiatal hernia    Hx of echocardiogram    Echo 5/16:   Mild focal basal septal hypertrophy, EF 55%, mild AI, MV repair ok with trivial MR (mean 3 mmHg), mild LAE, mild RAE, PASP 35 mmHg   Hypertension    Hypothyroidism    Irritable bowel syndrome    PAF (paroxysmal atrial fibrillation) (HCC) 05/03/2014   Personal history of colonic polyps    tubular adenoma   Pleural effusion, bilateral 03/12/2013   Small to moderate, L>R   Prostate cancer [facility]) 1991   sees Dr. Gala Lewandowsky at [facility], observation only    S/P CABG x 2 02/28/2013   LIMA to LAD, SVG to OM1, EVH via right thigh   S/P Maze operation for atrial fibrillation 02/28/2013   Complete bilateral atrial lesion set using bipolar radiofrequency and cryothermy ablation with clipping of LA appendage   S/P mitral valve repair, maze procedure, and CABG x2 02/28/2013   Complex valvuloplasty including triangular resection of posterior leaflet, 30 mm Sorin Memo 3D ring annuloplasty   Past Surgical History:  Procedure Laterality Date   CARDIAC CATHETERIZATION  2015   CATARACT EXTRACTION, BILATERAL Bilateral 2014   per Dr. Nile Riggs    COLONOSCOPY  2011   per Dr. Jarold Motto, benign polyps, no repeats needed    CORONARY ARTERY BYPASS GRAFT N/A 02/28/2013   Procedure: CORONARY ARTERY BYPASS GRAFTING (CABG);  Surgeon: Purcell Nails, MD;  Location: [facility] OR;  Service: Open Heart Surgery;  Laterality: N/A;  CABG x 2,  using left internal mammary artery and right leg saphenous vein harvested endoscopically  ESOPHAGOGASTRODUODENOSCOPY (EGD) WITH PROPOFOL N/A 01/11/2019   Procedure: ESOPHAGOGASTRODUODENOSCOPY (EGD) WITH PROPOFOL;  Surgeon: Sherrilyn Rist, MD;  Location: [facility];  Service: Gastroenterology;  Laterality: N/A;   HEMORRHOID SURGERY     INTRAOPERATIVE TRANSESOPHAGEAL ECHOCARDIOGRAM N/A 02/28/2013   Procedure: INTRAOPERATIVE TRANSESOPHAGEAL ECHOCARDIOGRAM;  Surgeon: Purcell Nails, MD;  Location: [facility] OR;  Service: Open Heart Surgery;  Laterality: N/A;   LEFT HEART CATHETERIZATION WITH CORONARY ANGIOGRAM  N/A 02/26/2013   Procedure: LEFT HEART CATHETERIZATION WITH CORONARY ANGIOGRAM;  Surgeon: Marykay Lex, MD;  Location: [facility] CATH LAB;  Service: Cardiovascular;  Laterality: N/A;   MAZE N/A 02/28/2013   Procedure: MAZE;  Surgeon: Purcell Nails, MD;  Location: [facility] OR;  Service: Open Heart Surgery;  Laterality: N/A;   MITRAL VALVE REPAIR N/A 02/28/2013   Procedure: MITRAL VALVE REPAIR (MVR);  Surgeon: Purcell Nails, MD;  Location: [facility] OR;  Service: Open Heart Surgery;  Laterality: N/A;   PROSTATE BIOPSY     TRANSURETHRAL RESECTION OF PROSTATE N/A 02/04/2017   Procedure: TRANSURETHRAL RESECTION OF THE PROSTATE (TURP);  Surgeon: Sebastian Ache, MD;  Location: [facility] ORS;  Service: Urology;  Laterality: N/A;    Allergies  Allergen Reactions   Other Other (See Comments)    ANESTHESIA.... 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 ANESTHESIA.... 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 Occurred post op CABG 2015, required readmission and rehab    Allergies as of 08/17/2022       Reactions   Other Other (See Comments)   ANESTHESIA.... 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 ANESTHESIA.... 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 Occurred post op CABG 2015, required readmission and rehab        Medication List        Accurate as of August 17, 2022  3:12 PM. If you have any questions, ask your nurse or doctor.          acetaminophen 500 MG tablet Commonly known as: TYLENOL Take 2 tablets (1,000 mg total) by mouth 2 (two) times daily as needed.   atorvastatin 10 MG tablet Commonly known as: LIPITOR TAKE 1 TABLET EVERY DAY AT 6PM   Cyanocobalamin 2500 MCG Subl Place 1 tablet under the tongue daily.   ferrous sulfate 325 (65 FE) MG EC tablet TAKE 1 TABLET BY MOUTH EVERY DAY   fish oil-omega-3 fatty acids 1000 MG capsule Take 1 g by mouth every morning.   hydrOXYzine 25 MG capsule Commonly known as: VISTARIL Take 1  capsule (25 mg total) by mouth 2 (two) times daily as needed.   levothyroxine 50 MCG tablet Commonly known as: SYNTHROID TAKE 1 TABLET BY MOUTH IN THE MORNING   METOPROLOL TARTRATE PO Take 12.5 mg by mouth in the morning and at bedtime.   nitroGLYCERIN 0.4 MG SL tablet Commonly known as: NITROSTAT Place 1 tablet (0.4 mg total) under the tongue every 5 (five) minutes as needed for chest pain.   OLANZapine 2.5 MG tablet Commonly known as: ZYPREXA Take 2.5 mg by mouth daily. Give at 4pm   simethicone 80 MG chewable tablet Commonly known as: MYLICON Chew 80 mg by mouth every 12 (twelve) hours as needed for flatulence.   VITAMIN D3 PO Take 2,000 Units by mouth daily.   zinc oxide 20 % ointment Apply 1 Application topically as needed for irritation.        Review of Systems  Unable to perform ROS: Dementia  Immunization History  Administered Date(s) Administered   Fluad Quad(high Dose 65+) 11/21/2018, 12/25/2019, 01/02/2021, 12/16/2021   Influenza Split 12/15/2006, 12/06/2007, 12/31/2009, 11/01/2011   Influenza Whole 12/15/2006, 12/06/2007, 12/31/2009   Influenza, High Dose Seasonal PF 11/11/2014, 11/12/2015, 11/15/2016, 11/18/2017, 11/21/2018, 01/02/2021   Influenza,inj,Quad PF,6+ Mos 11/01/2012, 11/09/2013   Influenza-Unspecified 11/01/2011, 11/01/2012, 11/09/2013   Moderna SARS-COV2 Booster Vaccination 01/07/2020   Moderna Sars-Covid-2 Vaccination 02/26/2019, 03/26/2019   PFIZER(Purple Top)SARS-COV-2 Vaccination 12/29/2021   Pneumococcal Conjugate-13 11/09/2013   Pneumococcal Polysaccharide-23 02/23/2004   Td 02/22/2002   Td (Adult),5 Lf Tetanus Toxid, Preservative Free 02/22/2002   Tdap 01/22/2014   Pertinent  Health Maintenance Due  Topic Date Due   INFLUENZA VACCINE  09/23/2022   Colonoscopy  Discontinued      12/18/2021   10:15 AM 01/25/2022    1:55 PM 02/05/2022    9:54 AM 05/31/2022    3:57 PM 08/11/2022    2:32 PM  Fall Risk  Falls in the past year?  1 1 1 1  0  Was there an injury with Fall? 0 0 0 0 0  Fall Risk Category Calculator 2 2 2 2  0  Fall Risk Category (Retired) Moderate Moderate Moderate    (RETIRED) Patient Fall Risk Level Moderate fall risk Moderate fall risk Moderate fall risk    Patient at Risk for Falls Due to History of fall(s);Impaired balance/gait;Impaired mobility History of fall(s);Impaired balance/gait;Impaired mobility History of fall(s);Impaired balance/gait;Impaired mobility History of fall(s);Impaired balance/gait;Impaired mobility;Mental status change History of fall(s);Impaired balance/gait;Impaired mobility;Mental status change  Fall risk Follow up Falls evaluation completed;Education provided;Falls prevention discussed Falls evaluation completed Falls evaluation completed Falls evaluation completed;Education provided;Falls prevention discussed Falls evaluation completed;Education provided;Falls prevention discussed   Functional Status Survey:    Vitals:   08/17/22 1056  BP: 124/66  Pulse: 67  Resp: 18  Temp: 97.7 F (36.5 C)  SpO2: 97%  Weight: 140 lb 11.2 oz (63.8 kg)  Height: 5\' 9"  (1.753 m)   Body mass index is 20.78 kg/m. Physical Exam Vitals and nursing note reviewed.  Constitutional:      Appearance: Normal appearance.  HENT:     Head: Normocephalic and atraumatic.     Nose: Nose normal.     Mouth/Throat:     Mouth: Mucous membranes are moist.  Eyes:     Extraocular Movements: Extraocular movements intact.     Conjunctiva/sclera: Conjunctivae normal.     Pupils: Pupils are equal, round, and reactive to light.  Cardiovascular:     Rate and Rhythm: Normal rate and regular rhythm.     Heart sounds: No murmur heard. Pulmonary:     Effort: Pulmonary effort is normal.     Breath sounds: No wheezing or rales.  Abdominal:     General: Bowel sounds are normal.     Palpations: Abdomen is soft.     Tenderness: There is no abdominal tenderness.  Musculoskeletal:     Cervical back: Normal  range of motion and neck supple.     Right lower leg: No edema.     Left lower leg: No edema.  Skin:    General: Skin is warm and dry.  Neurological:     General: No focal deficit present.     Mental Status: He is alert.     Motor: No weakness.     Gait: Gait abnormal.     Comments: Oriented to self.   Psychiatric:        Mood and Affect: Mood normal.  Comments: Irritable, confused     Labs reviewed: Recent Labs    12/11/21 0000 02/18/22 0000 07/26/22 0000  NA 137 136* 137  K 4.2 4.6 4.0  CL 103 104 106  CO2 28* 24* 26*  BUN 21 23* 16  CREATININE 1.1 1.0 1.0  CALCIUM 8.7 8.5* 8.7   Recent Labs    12/11/21 0000 07/26/22 0000  AST 17 14  ALT 16 9*  ALKPHOS 68 55  ALBUMIN 3.4* 3.1*   Recent Labs    12/11/21 0000 02/18/22 0000 07/26/22 0000  WBC 5.1 6.9 4.3  NEUTROABS 3,738.00  --  2,877.00  HGB 12.1* 10.7* 11.6*  HCT 36* 32* 35*  PLT 239 182 145*   Lab Results  Component Value Date   TSH 3.01 07/26/2022   Lab Results  Component Value Date   HGBA1C 5.6 07/01/2016   Lab Results  Component Value Date   CHOL 122 12/11/2021   HDL 51 12/11/2021   LDLCALC 55 12/11/2021   LDLDIRECT 159.3 05/17/2007   TRIG 75 12/11/2021   CHOLHDL 2 01/28/2021    Significant Diagnostic Results in last 30 days:  No results found.  Assessment/Plan Dementia with agitation (HCC) started Olanzapine 2.5mg  since 08/11/22 extremely confused, agitated during night, wanted to call the police for been held at [facility] against his will, staff question of if AR of Zyprexa Unable to obtain HPI 2/2 dementia UA C/S to r/o UTI, prn Lorazepam 0.5mg  every day x 14 days, observe.   Essential hypertension Blood pressure is controlled, takes Metoprolol. Bun/creat 16/1.0 07/26/22  Hypothyroidism  takes Levothyroxine, TSH 3.01 07/26/22  Anemia due to multiple mechanisms on Iron, Vit B12, Hgb 11.6 07/26/22  ADENOCARCINOMA, PROSTATE, GLEASON GRADE 3 Prostate cancer, followed by urology.  Urethral obstruction, stent changes q26mos    Family/ staff Communication: plan of care reviewed with the patient and charge nurse.   Labs/tests ordered:  UA C/S  Time spend 35 minutes

## 2022-08-18 DIAGNOSIS — R41841 Cognitive communication deficit: Secondary | ICD-10-CM | POA: Diagnosis not present

## 2022-08-18 DIAGNOSIS — N390 Urinary tract infection, site not specified: Secondary | ICD-10-CM | POA: Diagnosis not present

## 2022-08-20 ENCOUNTER — Encounter: Payer: Self-pay | Admitting: Orthopedic Surgery

## 2022-08-20 ENCOUNTER — Non-Acute Institutional Stay (SKILLED_NURSING_FACILITY): Payer: Medicare HMO | Admitting: Orthopedic Surgery

## 2022-08-20 DIAGNOSIS — C61 Malignant neoplasm of prostate: Secondary | ICD-10-CM | POA: Diagnosis not present

## 2022-08-20 DIAGNOSIS — F03C11 Unspecified dementia, severe, with agitation: Secondary | ICD-10-CM

## 2022-08-20 MED ORDER — QUETIAPINE FUMARATE 25 MG PO TABS
25.00 mg | ORAL_TABLET | Freq: Every day | ORAL | 0 refills | Status: DC
Start: 2022-08-25 — End: 2022-08-27

## 2022-08-20 MED ORDER — QUETIAPINE FUMARATE 25 MG PO TABS
12.50 mg | ORAL_TABLET | Freq: Every day | ORAL | 0 refills | Status: DC
Start: 2022-08-20 — End: 2022-08-27

## 2022-08-20 NOTE — Progress Notes (Signed)
Location:   [facility] Room Number: 28-A Place of Service:  SNF (31) Provider:  Hazle Nordmann, NP    Patient Care Team: Octavia Heir, NP as PCP - General (Adult Health Nurse Practitioner) Verner Chol, [facility] (Inactive) as Pharmacist (Pharmacist) Mahlon Gammon, MD as Consulting Physician (Internal Medicine)  Extended Emergency Contact Information Primary Emergency Contact: Crookston,Geraldine Address: [address] [place]          [place] [address] [place] Phone: [phone] Relation: Spouse Secondary Emergency Contact: Colvin Caroli Mobile Phone: [phone] Relation: Daughter  Code Status:  DNR Goals of care: Advanced Directive information    08/20/2022    9:58 AM  Advanced Directives  Does Patient Have a Medical Advance Directive? Yes  Type of Estate agent of [place];Out of facility DNR (pink MOST or yellow form)  Does patient want to make changes to medical advance directive? No - Patient declined  Copy of Healthcare Power of Attorney in Chart? Yes - validated most recent copy scanned in chart (See row information)     Chief Complaint  Patient presents with   Acute Visit    Hallucinations.     HPI:  Pt is a 89 y.o. male seen today for an acute visit due to ongoing hallucinations and sundowning.   PMH: CAD with CABG x 2, HTN, HLD, CHF, mitral valve repair 2015, prostate cancer, TURP 2018, left ureteroscopy with left stent placement 10/2021, Diverticulosis, GERD, GI bleed, hypothyroidism, osteopenia, and falls.   "Increased delusion occurring in the late afternoon/ evening x 1 week. 06/18 he was started on Zyprexa 2.5 mg daily. Last night, he became increasingly agitation. 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 He wanted to call police and FBI. He had a period of increased yelling as well. Wife was able to calm him some. Son was called as well. He was given ativan 0.5 mg  once by on call provider and behaviors subsided."   06/25 UA and culture ordered due to increased confusion and agitation.   UA> occult blood 2+, + leukocytes (3+), wbc (>60) and bacteria. Urine culture negative for growth.   Today, wife reports behaviors are occurring earlier> confirmed by nursing staff. He is either confused, very agitated or anxious. Last night he refused evening medications and was yelling at staff.     Past Medical History:  Diagnosis Date   Complication of anesthesia    "serious cognisance problems since my OR in January" (03/13/2013   Coronary artery disease    Diverticulosis    Hiatal hernia    Hx of echocardiogram    Echo 5/16:  Mild focal basal septal hypertrophy, EF 55%, mild AI, MV repair ok with trivial MR (mean 3 mmHg), mild LAE, mild RAE, PASP 35 mmHg   Hypertension    Hypothyroidism    Irritable bowel syndrome    PAF (paroxysmal atrial fibrillation) (HCC) 05/03/2014   Personal history of colonic polyps    tubular adenoma   Pleural effusion, bilateral 03/12/2013   Small to moderate, L>R   Prostate cancer [facility]) 1991   sees Dr. Gala Lewandowsky at [facility], observation only    S/P CABG x 2 02/28/2013   LIMA to LAD, SVG to OM1, EVH via right thigh   S/P Maze operation for atrial fibrillation 02/28/2013   Complete bilateral atrial lesion set using bipolar radiofrequency and cryothermy ablation with clipping of LA appendage   S/P mitral valve  repair, maze procedure, and CABG x2 02/28/2013   Complex valvuloplasty including triangular resection of posterior leaflet, 30 mm Sorin Memo 3D ring annuloplasty   Past Surgical History:  Procedure Laterality Date   CARDIAC CATHETERIZATION  2015   CATARACT EXTRACTION, BILATERAL Bilateral 2014   per Dr. Nile Riggs    COLONOSCOPY  2011   per Dr. Jarold Motto, benign polyps, no repeats needed    CORONARY ARTERY BYPASS GRAFT N/A 02/28/2013   Procedure: CORONARY ARTERY BYPASS GRAFTING (CABG);  Surgeon: Purcell Nails, MD;  Location: [facility]  OR;  Service: Open Heart Surgery;  Laterality: N/A;  CABG x 2,  using left internal mammary artery and right leg saphenous vein harvested endoscopically   ESOPHAGOGASTRODUODENOSCOPY (EGD) WITH PROPOFOL N/A 01/11/2019   Procedure: ESOPHAGOGASTRODUODENOSCOPY (EGD) WITH PROPOFOL;  Surgeon: Sherrilyn Rist, MD;  Location: [facility] ENDOSCOPY;  Service: Gastroenterology;  Laterality: N/A;   HEMORRHOID SURGERY     INTRAOPERATIVE TRANSESOPHAGEAL ECHOCARDIOGRAM N/A 02/28/2013   Procedure: INTRAOPERATIVE TRANSESOPHAGEAL ECHOCARDIOGRAM;  Surgeon: Purcell Nails, MD;  Location: [facility] OR;  Service: Open Heart Surgery;  Laterality: N/A;   LEFT HEART CATHETERIZATION WITH CORONARY ANGIOGRAM N/A 02/26/2013   Procedure: LEFT HEART CATHETERIZATION WITH CORONARY ANGIOGRAM;  Surgeon: Marykay Lex, MD;  Location: [facility] CATH LAB;  Service: Cardiovascular;  Laterality: N/A;   MAZE N/A 02/28/2013   Procedure: MAZE;  Surgeon: Purcell Nails, MD;  Location: [facility] OR;  Service: Open Heart Surgery;  Laterality: N/A;   MITRAL VALVE REPAIR N/A 02/28/2013   Procedure: MITRAL VALVE REPAIR (MVR);  Surgeon: Purcell Nails, MD;  Location: [facility] OR;  Service: Open Heart Surgery;  Laterality: N/A;   PROSTATE BIOPSY     TRANSURETHRAL RESECTION OF PROSTATE N/A 02/04/2017   Procedure: TRANSURETHRAL RESECTION OF THE PROSTATE (TURP);  Surgeon: Sebastian Ache, MD;  Location: [facility] ORS;  Service: Urology;  Laterality: N/A;    Allergies  Allergen Reactions   Other Other (See Comments)    ANESTHESIA.... 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 ANESTHESIA.... 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 Occurred post op CABG 2015, required readmission and rehab    Allergies as of 08/20/2022       Reactions   Other Other (See Comments)   ANESTHESIA.... 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 ANESTHESIA.... 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 Occurred post op CABG 2015, required  readmission and rehab        Medication List        Accurate as of August 20, 2022  9:59 AM. If you have any questions, ask your nurse or doctor.          acetaminophen 500 MG tablet Commonly known as: TYLENOL Take 2 tablets (1,000 mg total) by mouth 2 (two) times daily as needed.   atorvastatin 10 MG tablet Commonly known as: LIPITOR TAKE 1 TABLET EVERY DAY AT 6PM   Cyanocobalamin 2500 MCG Subl Place 1 tablet under the tongue daily.   ferrous sulfate 325 (65 FE) MG EC tablet TAKE 1 TABLET BY MOUTH EVERY DAY   fish oil-omega-3 fatty acids 1000 MG capsule Take 1 g by mouth every morning.   hydrOXYzine 25 MG capsule Commonly known as: VISTARIL Take 1 capsule (25 mg total) by mouth 2 (two) times daily as needed.   IMODIUM ADVANCED PO Take 4 mg by mouth as needed.   levothyroxine 50 MCG tablet Commonly known as: SYNTHROID TAKE 1 TABLET BY MOUTH IN  THE MORNING   LORazepam 0.5 MG tablet Commonly known as: ATIVAN Take 0.5 mg by mouth as directed. Every 24hrs as needed.   METOPROLOL TARTRATE PO Take 12.5 mg by mouth in the morning and at bedtime.   nitroGLYCERIN 0.4 MG SL tablet Commonly known as: NITROSTAT Place 1 tablet (0.4 mg total) under the tongue every 5 (five) minutes as needed for chest pain.   OLANZapine 2.5 MG tablet Commonly known as: ZYPREXA Take 2.5 mg by mouth daily. Give at 4pm   simethicone 80 MG chewable tablet Commonly known as: MYLICON Chew 80 mg by mouth every 12 (twelve) hours as needed for flatulence.   VITAMIN D3 PO Take 2,000 Units by mouth daily.   zinc oxide 20 % ointment Apply 1 Application topically as needed for irritation.        Review of Systems  Unable to perform ROS: Dementia    Immunization History  Administered Date(s) Administered   Fluad Quad(high Dose 65+) 11/21/2018, 12/25/2019, 01/02/2021, 12/16/2021   Influenza Split 12/15/2006, 12/06/2007, 12/31/2009, 11/01/2011   Influenza Whole 12/15/2006, 12/06/2007,  12/31/2009   Influenza, High Dose Seasonal PF 11/11/2014, 11/12/2015, 11/15/2016, 11/18/2017, 11/21/2018, 01/02/2021   Influenza,inj,Quad PF,6+ Mos 11/01/2012, 11/09/2013   Influenza-Unspecified 11/01/2011, 11/01/2012, 11/09/2013   Moderna SARS-COV2 Booster Vaccination 01/07/2020   Moderna Sars-Covid-2 Vaccination 02/26/2019, 03/26/2019   PFIZER(Purple Top)SARS-COV-2 Vaccination 12/29/2021   Pneumococcal Conjugate-13 11/09/2013   Pneumococcal Polysaccharide-23 02/23/2004   Td 02/22/2002   Td (Adult),5 Lf Tetanus Toxid, Preservative Free 02/22/2002   Tdap 01/22/2014   Pertinent  Health Maintenance Due  Topic Date Due   INFLUENZA VACCINE  09/23/2022   Colonoscopy  Discontinued      12/18/2021   10:15 AM 01/25/2022    1:55 PM 02/05/2022    9:54 AM 05/31/2022    3:57 PM 08/11/2022    2:32 PM  Fall Risk  Falls in the past year? 1 1 1 1  0  Was there an injury with Fall? 0 0 0 0 0  Fall Risk Category Calculator 2 2 2 2  0  Fall Risk Category (Retired) Moderate Moderate Moderate    (RETIRED) Patient Fall Risk Level Moderate fall risk Moderate fall risk Moderate fall risk    Patient at Risk for Falls Due to History of fall(s);Impaired balance/gait;Impaired mobility History of fall(s);Impaired balance/gait;Impaired mobility History of fall(s);Impaired balance/gait;Impaired mobility History of fall(s);Impaired balance/gait;Impaired mobility;Mental status change History of fall(s);Impaired balance/gait;Impaired mobility;Mental status change  Fall risk Follow up Falls evaluation completed;Education provided;Falls prevention discussed Falls evaluation completed Falls evaluation completed Falls evaluation completed;Education provided;Falls prevention discussed Falls evaluation completed;Education provided;Falls prevention discussed   Functional Status Survey:    Vitals:   08/20/22 0954  BP: (!) 142/86  Pulse: 66  Resp: 18  Temp: 98.2 F (36.8 C)  SpO2: 90%  Weight: 140 lb 11.2 oz (63.8 kg)   Height: 5\' 9"  (1.753 m)   Body mass index is 20.78 kg/m. Physical Exam Vitals reviewed.  Constitutional:      General: He is not in acute distress. HENT:     Head: Normocephalic.     Right Ear: There is no impacted cerumen.     Left Ear: There is no impacted cerumen.     Ears:     Comments: HOH    Nose: Nose normal.     Mouth/Throat:     Mouth: Mucous membranes are moist.  Eyes:     General:        Right eye: No discharge.  Left eye: No discharge.     Pupils: Pupils are equal, round, and reactive to light.  Cardiovascular:     Rate and Rhythm: Normal rate and regular rhythm.     Pulses: Normal pulses.     Heart sounds: Normal heart sounds.  Pulmonary:     Effort: Pulmonary effort is normal. No respiratory distress.     Breath sounds: Normal breath sounds. No wheezing or rales.  Abdominal:     General: Bowel sounds are normal.     Palpations: Abdomen is soft.  Musculoskeletal:     Right lower leg: No edema.     Left lower leg: No edema.  Skin:    General: Skin is warm.     Capillary Refill: Capillary refill takes less than 2 seconds.  Neurological:     General: No focal deficit present.     Mental Status: He is alert. Mental status is at baseline.     Motor: Weakness present.     Gait: Gait abnormal.     Comments: wheelchair  Psychiatric:     Comments: Alert to self/wife/place, follows commands    Labs reviewed: Recent Labs    12/11/21 0000 02/18/22 0000 07/26/22 0000  NA 137 136* 137  K 4.2 4.6 4.0  CL 103 104 106  CO2 28* 24* 26*  BUN 21 23* 16  CREATININE 1.1 1.0 1.0  CALCIUM 8.7 8.5* 8.7   Recent Labs    12/11/21 0000 07/26/22 0000  AST 17 14  ALT 16 9*  ALKPHOS 68 55  ALBUMIN 3.4* 3.1*   Recent Labs    12/11/21 0000 02/18/22 0000 07/26/22 0000  WBC 5.1 6.9 4.3  NEUTROABS 3,738.00  --  2,877.00  HGB 12.1* 10.7* 11.6*  HCT 36* 32* 35*  PLT 239 182 145*   Lab Results  Component Value Date   TSH 3.01 07/26/2022   Lab  Results  Component Value Date   HGBA1C 5.6 07/01/2016   Lab Results  Component Value Date   CHOL 122 12/11/2021   HDL 51 12/11/2021   LDLCALC 55 12/11/2021   LDLDIRECT 159.3 05/17/2007   TRIG 75 12/11/2021   CHOLHDL 2 01/28/2021    Significant Diagnostic Results in last 30 days:  No results found.  Assessment/Plan 1. Severe dementia with agitation, unspecified dementia type (HCC) - increased hallucinations, agitation, yelling x 1-2 weeks - UA/culture> no growth - unsuccessful trial of Zyprexa  - will try Seroquel  - cont skilled nursing  - cont ativan prn - QUEtiapine (SEROQUEL) 25 MG tablet; Take 0.5 tablets (12.5 mg total) by mouth daily in the afternoon for 5 days.  Dispense: 2.5 tablet; Refill: 0 - QUEtiapine (SEROQUEL) 25 MG tablet; Take 1 tablet (25 mg total) by mouth daily in the afternoon.  Dispense: 30 tablet; Refill: 0   2. ADENOCARCINOMA, PROSTATE, GLEASON GRADE 3 - followed by Dr. Merry Lofty - on Lupron injections q 3 months> last one 08/05/2022   Family/ staff Communication: plan discussed with patient and nurse  Labs/tests ordered:  none

## 2022-08-23 DIAGNOSIS — R41841 Cognitive communication deficit: Secondary | ICD-10-CM | POA: Diagnosis not present

## 2022-08-25 DIAGNOSIS — R41841 Cognitive communication deficit: Secondary | ICD-10-CM | POA: Diagnosis not present

## 2022-08-27 ENCOUNTER — Non-Acute Institutional Stay (SKILLED_NURSING_FACILITY): Payer: Medicare HMO | Admitting: Orthopedic Surgery

## 2022-08-27 ENCOUNTER — Encounter: Payer: Self-pay | Admitting: Orthopedic Surgery

## 2022-08-27 DIAGNOSIS — W19XXXA Unspecified fall, initial encounter: Secondary | ICD-10-CM | POA: Diagnosis not present

## 2022-08-27 DIAGNOSIS — S51012A Laceration without foreign body of left elbow, initial encounter: Secondary | ICD-10-CM | POA: Diagnosis not present

## 2022-08-27 DIAGNOSIS — F03C11 Unspecified dementia, severe, with agitation: Secondary | ICD-10-CM | POA: Diagnosis not present

## 2022-08-27 DIAGNOSIS — R41841 Cognitive communication deficit: Secondary | ICD-10-CM | POA: Diagnosis not present

## 2022-08-27 MED ORDER — QUETIAPINE FUMARATE 25 MG PO TABS
25.0000 mg | ORAL_TABLET | Freq: Every day | ORAL | 0 refills | Status: DC
Start: 2022-08-27 — End: 2023-11-14

## 2022-08-27 MED ORDER — DIVALPROEX SODIUM 125 MG PO CSDR: 125.0000 mg | DELAYED_RELEASE_CAPSULE | Freq: Every day | ORAL | 0 refills | Status: DC

## 2022-08-27 NOTE — Progress Notes (Signed)
Location:   [facility] Room Number: 28 Place of Service:  SNF ([phone]) Provider:  Hazle Nordmann, NP  Octavia Heir, NP  Patient Care Team: Octavia Heir, NP as PCP - General (Adult Health Nurse Practitioner) Verner Chol, [facility] (Inactive) as Pharmacist (Pharmacist) Mahlon Gammon, MD as Consulting Physician (Internal Medicine)  Extended Emergency Contact Information Primary Emergency Contact: Dang,Geraldine Address: [address] [place]          [place] [address] D[place] Home Phone: [phone] Relation: Spouse Secondary Emergency Contact: Colvin Caroli Mobile Phone: [phone] Relation: Daughter  Code Status:  DNR Goals of care: Advanced Directive information    08/27/2022    9:49 AM  Advanced Directives  Does Patient Have a Medical Advance Directive? Yes  Type of Estate agent of [place];Out of facility DNR (pink MOST or yellow form)  Does patient want to make changes to medical advance directive? No - Patient declined  Copy of Healthcare Power of Attorney in Chart? Yes - validated most recent copy scanned in chart (See row information)     Chief Complaint  Patient presents with   Acute Visit    Increased agitation    HPI:  Pt is a 89 y.o. male seen today for an acute visit due to increased agitation.   PMH: CAD with CABG x 2, HTN, HLD, CHF, mitral valve repair 2015, prostate cancer, TURP 2018, left ureteroscopy with left stent placement 10/2021, Diverticulosis, GERD, GI bleed, hypothyroidism, osteopenia, and falls.   Increased agitation and sundowning behavior x 4 weeks. 06/18 started on Zyprexa. He continues to have hallucinations and medication was discontinued. 06/25 UA/culture negative for growth. 06/28 he was started on Seroquel 12.5 mg x 5 days then increased to 25 mg. Today. He continues to have agitation after 4 pm. Last night he refused to take medication. He was also observed going into other  residents rooms. He is also prescribed ativan 0.5 mg prn for agitation. Appears he has been getting a dose every evening per chart review. Afebrile. Vitals stable.   07/02 left elbow skin tear. Unknown DOI. 06/29 found on the floor by staff. No apparent injury per note.      Past Medical History:  Diagnosis Date   Complication of anesthesia    "serious cognisance problems since my OR in January" (03/13/2013   Coronary artery disease    Diverticulosis    Hiatal hernia    Hx of echocardiogram    Echo 5/16:  Mild focal basal septal hypertrophy, EF 55%, mild AI, MV repair ok with trivial MR (mean 3 mmHg), mild LAE, mild RAE, PASP 35 mmHg   Hypertension    Hypothyroidism    Irritable bowel syndrome    PAF (paroxysmal atrial fibrillation) (HCC) 05/03/2014   Personal history of colonic polyps    tubular adenoma   Pleural effusion, bilateral 03/12/2013   Small to moderate, L>R   Prostate cancer [facility]) 1991   sees Dr. Gala Lewandowsky at [facility], observation only    S/P CABG x 2 02/28/2013   LIMA to LAD, SVG to OM1, EVH via right thigh   S/P Maze operation for atrial fibrillation 02/28/2013   Complete bilateral atrial lesion set using bipolar radiofrequency and cryothermy ablation with clipping of LA appendage   S/P mitral valve repair, maze procedure, and CABG x2 02/28/2013   Complex valvuloplasty including triangular resection of posterior leaflet, 30 mm Sorin Memo 3D ring annuloplasty   Past Surgical History:  Procedure Laterality Date   CARDIAC CATHETERIZATION  2015   CATARACT EXTRACTION, BILATERAL Bilateral 2014   per Dr. Nile Riggs    COLONOSCOPY  2011   per Dr. Jarold Motto, benign polyps, no repeats needed    CORONARY ARTERY BYPASS GRAFT N/A 02/28/2013   Procedure: CORONARY ARTERY BYPASS GRAFTING (CABG);  Surgeon: Purcell Nails, MD;  Location: [facility] OR;  Service: Open Heart Surgery;  Laterality: N/A;  CABG x 2,  using left internal mammary artery and right leg saphenous vein harvested endoscopically    ESOPHAGOGASTRODUODENOSCOPY (EGD) WITH PROPOFOL N/A 01/11/2019   Procedure: ESOPHAGOGASTRODUODENOSCOPY (EGD) WITH PROPOFOL;  Surgeon: Sherrilyn Rist, MD;  Location: [facility] ENDOSCOPY;  Service: Gastroenterology;  Laterality: N/A;   HEMORRHOID SURGERY     INTRAOPERATIVE TRANSESOPHAGEAL ECHOCARDIOGRAM N/A 02/28/2013   Procedure: INTRAOPERATIVE TRANSESOPHAGEAL ECHOCARDIOGRAM;  Surgeon: Purcell Nails, MD;  Location[facility] OR;  Service: Open Heart Surgery;  Laterality: N/A;   LEFT HEART CATHETERIZATION WITH CORONARY ANGIOGRAM N/A 02/26/2013   Procedure: LEFT HEART CATHETERIZATION WITH CORONARY ANGIOGRAM;  Surgeon: Marykay Lex, MD;  Location: [facility] CATH LAB;  Service: Cardiovascular;  Laterality: N/A;   MAZE N/A 02/28/2013   Procedure: MAZE;  Surgeon: Purcell Nails, MD;  Location: [facility] OR;  Service: Open Heart Surgery;  Laterality: N/A;   MITRAL VALVE REPAIR N/A 02/28/2013   Procedure: MITRAL VALVE REPAIR (MVR);  Surgeon: Purcell Nails, MD;  Location: [facility] OR;  Service: Open Heart Surgery;  Laterality: N/A;   PROSTATE BIOPSY     TRANSURETHRAL RESECTION OF PROSTATE N/A 02/04/2017   Procedure: TRANSURETHRAL RESECTION OF THE PROSTATE (TURP);  Surgeon: Sebastian Ache, MD;  Location: [facility] ORS;  Service: Urology;  Laterality: N/A;    Allergies  Allergen Reactions   Other Other (See Comments)    ANESTHESIA.... 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 ANESTHESIA.... 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 Occurred post op CABG 2015, required readmission and rehab    Allergies as of 08/27/2022       Reactions   Other Other (See Comments)   ANESTHESIA.... 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 ANESTHESIA.... 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 Occurred post op CABG 2015, required readmission and rehab        Medication List        Accurate as of August 27, 2022  9:55 AM. If you have any questions, ask your nurse or doctor.           acetaminophen 500 MG tablet Commonly known as: TYLENOL Take 2 tablets (1,000 mg total) by mouth 2 (two) times daily as needed.   atorvastatin 10 MG tablet Commonly known as: LIPITOR TAKE 1 TABLET EVERY DAY AT 6PM   Cyanocobalamin 2500 MCG Subl Place 1 tablet under the tongue daily.   ferrous sulfate 325 (65 FE) MG EC tablet TAKE 1 TABLET BY MOUTH EVERY DAY   fish oil-omega-3 fatty acids 1000 MG capsule Take 1 g by mouth every morning.   IMODIUM ADVANCED PO Take 4 mg by mouth as needed.   levothyroxine 50 MCG tablet Commonly known as: SYNTHROID TAKE 1 TABLET BY MOUTH IN THE MORNING   LORazepam 0.5 MG tablet Commonly known as: ATIVAN Take 0.5 mg by mouth as directed. Every 24hrs as needed.   METOPROLOL TARTRATE PO Take 12.5 mg by mouth in the morning and at bedtime.   nitroGLYCERIN 0.4 MG SL tablet Commonly known as: NITROSTAT Place 1 tablet (0.4  mg total) under the tongue every 5 (five) minutes as needed for chest pain.   QUEtiapine 25 MG tablet Commonly known as: SEROquel Take 1 tablet (25 mg total) by mouth daily in the afternoon. What changed: Another medication with the same name was removed. Continue taking this medication, and follow the directions you see here. Changed by: Octavia Heir, NP   simethicone 80 MG chewable tablet Commonly known as: MYLICON Chew 80 mg by mouth every 12 (twelve) hours as needed for flatulence.   VITAMIN D3 PO Take 2,000 Units by mouth daily.   zinc oxide 20 % ointment Apply 1 Application topically as needed for irritation.        Review of Systems  Unable to perform ROS: Dementia    Immunization History  Administered Date(s) Administered   Fluad Quad(high Dose 65+) 11/21/2018, 12/25/2019, 01/02/2021, 12/16/2021   Influenza Split 12/15/2006, 12/06/2007, 12/31/2009, 11/01/2011   Influenza Whole 12/15/2006, 12/06/2007, 12/31/2009   Influenza, High Dose Seasonal PF 11/11/2014, 11/12/2015, 11/15/2016, 11/18/2017,  11/21/2018, 01/02/2021   Influenza,inj,Quad PF,6+ Mos 11/01/2012, 11/09/2013   Influenza-Unspecified 11/01/2011, 11/01/2012, 11/09/2013   Moderna SARS-COV2 Booster Vaccination 01/07/2020   Moderna Sars-Covid-2 Vaccination 02/26/2019, 03/26/2019   PFIZER(Purple Top)SARS-COV-2 Vaccination 12/29/2021   Pneumococcal Conjugate-13 11/09/2013   Pneumococcal Polysaccharide-23 02/23/2004   Td 02/22/2002   Td (Adult),5 Lf Tetanus Toxid, Preservative Free 02/22/2002   Tdap 01/22/2014   Pertinent  Health Maintenance Due  Topic Date Due   INFLUENZA VACCINE  09/23/2022   Colonoscopy  Discontinued      12/18/2021   10:15 AM 01/25/2022    1:55 PM 02/05/2022    9:54 AM 05/31/2022    3:57 PM 08/11/2022    2:32 PM  Fall Risk  Falls in the past year? 1 1 1 1  0  Was there an injury with Fall? 0 0 0 0 0  Fall Risk Category Calculator 2 2 2 2  0  Fall Risk Category (Retired) Moderate Moderate Moderate    (RETIRED) Patient Fall Risk Level Moderate fall risk Moderate fall risk Moderate fall risk    Patient at Risk for Falls Due to History of fall(s);Impaired balance/gait;Impaired mobility History of fall(s);Impaired balance/gait;Impaired mobility History of fall(s);Impaired balance/gait;Impaired mobility History of fall(s);Impaired balance/gait;Impaired mobility;Mental status change History of fall(s);Impaired balance/gait;Impaired mobility;Mental status change  Fall risk Follow up Falls evaluation completed;Education provided;Falls prevention discussed Falls evaluation completed Falls evaluation completed Falls evaluation completed;Education provided;Falls prevention discussed Falls evaluation completed;Education provided;Falls prevention discussed   Functional Status Survey:    Vitals:   08/27/22 0946  BP: 138/83  Pulse: 66  Resp: 18  Temp: (!) 96.8 F (36 C)  SpO2: 94%  Weight: 140 lb 11.2 oz (63.8 kg)  Height: 5\' 9"  (1.753 m)   Body mass index is 20.78 kg/m. Physical Exam Vitals reviewed.   Constitutional:      General: He is not in acute distress. HENT:     Head: Normocephalic.  Eyes:     General:        Right eye: No discharge.        Left eye: No discharge.  Cardiovascular:     Rate and Rhythm: Normal rate and regular rhythm.     Pulses: Normal pulses.     Heart sounds: Normal heart sounds.  Pulmonary:     Effort: Pulmonary effort is normal. No respiratory distress.     Breath sounds: Normal breath sounds. No wheezing.  Abdominal:     General: Bowel sounds are normal. There is  no distension.     Palpations: Abdomen is soft.     Tenderness: There is no abdominal tenderness.  Musculoskeletal:     Cervical back: Neck supple.     Right lower leg: No edema.     Left lower leg: No edema.  Skin:    General: Skin is warm.     Capillary Refill: Capillary refill takes less than 2 seconds.     Comments: Approx 1 cm x 0.5 cm round skin tear to left elbow, CDI, surrounding skin intact  Neurological:     General: No focal deficit present.     Mental Status: He is alert and oriented to person, place, and time.  Psychiatric:        Mood and Affect: Mood normal.     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     Labs reviewed: Recent Labs    12/11/21 0000 02/18/22 0000 07/26/22 0000  NA 137 136* 137  K 4.2 4.6 4.0  CL 103 104 106  CO2 28* 24* 26*  BUN 21 23* 16  CREATININE 1.1 1.0 1.0  CALCIUM 8.7 8.5* 8.7   Recent Labs    12/11/21 0000 07/26/22 0000  AST 17 14  ALT 16 9*  ALKPHOS 68 55  ALBUMIN 3.4* 3.1*   Recent Labs    12/11/21 0000 02/18/22 0000 07/26/22 0000  WBC 5.1 6.9 4.3  NEUTROABS 3,738.00  --  2,877.00  HGB 12.1* 10.7* 11.6*  HCT 36* 32* 35*  PLT 239 182 145*   Lab Results  Component Value Date   TSH 3.01 07/26/2022   Lab Results  Component Value Date   HGBA1C 5.6 07/01/2016   Lab Results  Component Value Date   CHOL 122  12/11/2021   HDL 51 12/11/2021   LDLCALC 55 12/11/2021   LDLDIRECT 159.3 05/17/2007   TRIG 75 12/11/2021   CHOLHDL 2 01/28/2021    Significant Diagnostic Results in last 30 days:  No results found.  Assessment/Plan: 1. Severe dementia with agitation, unspecified dementia type (HCC) - ongoing- increased sundowning x 4 weeks - agitation, yelling, hallucinations, entering other residents room begins around 4 pm - 06/25 UA/culture negative - unsuccessful trial Zyprexa - 06/28 Seroquel 12.5 mg x 5 days> then 25 mg daily  - will change Seroquel to 2 pm  - start Depakote 125 mg po daily - hepatic panel 07/25 - cont ativan prn  2. Skin tear of left elbow without complication, initial encounter - noted 07/02 per chart review - no sign of infection - cont foam dressing   3. Fall, initial encounter - noted 06/29 - found on floor in room - suspect related to sundowning - unclear if skin tear occurred during this incident     Family/ staff Communication: plan discussed with patient and nurse  Labs/tests ordered:  hepatic panel 07/25

## 2022-08-30 DIAGNOSIS — R41841 Cognitive communication deficit: Secondary | ICD-10-CM | POA: Diagnosis not present

## 2022-09-01 DIAGNOSIS — R41841 Cognitive communication deficit: Secondary | ICD-10-CM | POA: Diagnosis not present

## 2022-09-02 ENCOUNTER — Non-Acute Institutional Stay (SKILLED_NURSING_FACILITY): Payer: Medicare HMO | Admitting: Internal Medicine

## 2022-09-02 ENCOUNTER — Encounter: Payer: Self-pay | Admitting: Internal Medicine

## 2022-09-02 DIAGNOSIS — R052 Subacute cough: Secondary | ICD-10-CM | POA: Diagnosis not present

## 2022-09-02 DIAGNOSIS — C61 Malignant neoplasm of prostate: Secondary | ICD-10-CM | POA: Diagnosis not present

## 2022-09-02 DIAGNOSIS — F01B11 Vascular dementia, moderate, with agitation: Secondary | ICD-10-CM | POA: Diagnosis not present

## 2022-09-02 NOTE — Progress Notes (Signed)
[facility] Service:  SNF (31)  Provider:   Code Status: DNR Goals of Care:     08/27/2022    9:49 AM  Advanced Directives  Does Patient Have a Medical Advance Directive? Yes  Type of Estate agent of [place];Out of facility DNR (pink MOST or yellow form)  Does patient want to make changes to medical advance directive? No - Patient declined  Copy of Healthcare Power of Attorney in Chart? Yes - validated most recent copy scanned in chart (See row information)     Chief Complaint  Patient presents with   Acute Visit    HPI: Patient is a 89 y.o. male seen today for an acute visit for Cough and Congestion Patient lives in SNF in [facility]  Noticed of complaining of runny nose congestion and cough.  Does not have any fever no chest pain no shortness of breath.  Patient also sees urology every 3 months for his stent change.  But did not they are planning to do cystoscopy next few weeks  Patient also is having issues with dementia and worsening behaviors specially in the evening.  Now is on Seroquel and Depakote    Other issues Lives in SNF with his Wife Patient has a history of CAD with CABG x2, hypertension, hyperlipidemia, CHF, mitral valve repair 2015,  history of GI bleed and hypothyroidism    prostate cancer diagnosed in 2012, underwent TURP in 2019 Cancer has advanced and has affected left urethra.  Gets Stent placed Q 3 months Also on Lupron injections every 3 months Patient also has now unstable gait and ambulates with wheelchair Cognitive decline Recent MRI shows small cortical infarcts and needing more help with ADLs.  Past Medical History:  Diagnosis Date   Complication of anesthesia    "serious cognisance problems since my OR in January" (03/13/2013   Coronary artery disease    Diverticulosis    Hiatal hernia    Hx of echocardiogram    Echo 5/16:  Mild focal basal septal hypertrophy, EF 55%, mild AI, MV repair ok  with trivial MR (mean 3 mmHg), mild LAE, mild RAE, PASP 35 mmHg   Hypertension    Hypothyroidism    Irritable bowel syndrome    PAF (paroxysmal atrial fibrillation) (HCC) 05/03/2014   Personal history of colonic polyps    tubular adenoma   Pleural effusion, bilateral 03/12/2013   Small to moderate, L>R   Prostate cancer [facility]) 1991   sees Dr. Gala Lewandowsky at [facility], observation only    S/P CABG x 2 02/28/2013   LIMA to LAD, SVG to OM1, EVH via right thigh   S/P Maze operation for atrial fibrillation 02/28/2013   Complete bilateral atrial lesion set using bipolar radiofrequency and cryothermy ablation with clipping of LA appendage   S/P mitral valve repair, maze procedure, and CABG x2 02/28/2013   Complex valvuloplasty including triangular resection of posterior leaflet, 30 mm Sorin Memo 3D ring annuloplasty    Past Surgical History:  Procedure Laterality Date   CARDIAC CATHETERIZATION  2015   CATARACT EXTRACTION, BILATERAL Bilateral 2014   per Dr. Nile Riggs    COLONOSCOPY  2011   per Dr. Jarold Motto, benign polyps, no repeats needed    CORONARY ARTERY BYPASS GRAFT N/A 02/28/2013   Procedure: CORONARY ARTERY BYPASS GRAFTING (CABG);  Surgeon: Purcell Nails, MD;  Location: [facility] OR;  Service: Open Heart Surgery;  Laterality: N/A;  CABG x 2,  using left internal  mammary artery and right leg saphenous vein harvested endoscopically   ESOPHAGOGASTRODUODENOSCOPY (EGD) WITH PROPOFOL N/A 01/11/2019   Procedure: ESOPHAGOGASTRODUODENOSCOPY (EGD) WITH PROPOFOL;  Surgeon: Sherrilyn Rist, MD;  Location: [facility] ENDOSCOPY;  Service: Gastroenterology;  Laterality: N/A;   HEMORRHOID SURGERY     INTRAOPERATIVE TRANSESOPHAGEAL ECHOCARDIOGRAM N/A 02/28/2013   Procedure: INTRAOPERATIVE TRANSESOPHAGEAL ECHOCARDIOGRAM;  Surgeon: Purcell Nails, MD;  Location: [facility] OR;  Service: Open Heart Surgery;  Laterality: N/A;   LEFT HEART CATHETERIZATION WITH CORONARY ANGIOGRAM N/A 02/26/2013   Procedure: LEFT HEART CATHETERIZATION WITH  CORONARY ANGIOGRAM;  Surgeon: Marykay Lex, MD;  Location: [facility] CATH LAB;  Service: Cardiovascular;  Laterality: N/A;   MAZE N/A 02/28/2013   Procedure: MAZE;  Surgeon: Purcell Nails, MD;  Location: [facility] OR;  Service: Open Heart Surgery;  Laterality: N/A;   MITRAL VALVE REPAIR N/A 02/28/2013   Procedure: MITRAL VALVE REPAIR (MVR);  Surgeon: Purcell Nails, MD;  Location: [facility] OR;  Service: Open Heart Surgery;  Laterality: N/A;   PROSTATE BIOPSY     TRANSURETHRAL RESECTION OF PROSTATE N/A 02/04/2017   Procedure: TRANSURETHRAL RESECTION OF THE PROSTATE (TURP);  Surgeon: Sebastian Ache, MD;  Location: [facility] ORS;  Service: Urology;  Laterality: N/A;    Allergies  Allergen Reactions   Other Other (See Comments)    ANESTHESIA.... 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 ANESTHESIA.... 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 Occurred post op CABG 2015, required readmission and rehab    Outpatient Encounter Medications as of 09/02/2022  Medication Sig   acetaminophen (TYLENOL) 500 MG tablet Take 2 tablets (1,000 mg total) by mouth 2 (two) times daily as needed.   atorvastatin (LIPITOR) 10 MG tablet TAKE 1 TABLET EVERY DAY AT 6PM   Cholecalciferol (VITAMIN D3 PO) Take 2,000 Units by mouth daily.   Cyanocobalamin 2500 MCG SUBL Place 1 tablet under the tongue daily.   divalproex (DEPAKOTE SPRINKLES) 125 MG capsule Take 1 capsule (125 mg total) by mouth daily.   ferrous sulfate 325 (65 FE) MG EC tablet TAKE 1 TABLET BY MOUTH EVERY DAY   fish oil-omega-3 fatty acids 1000 MG capsule Take 1 g by mouth every morning.   levothyroxine (SYNTHROID) 50 MCG tablet TAKE 1 TABLET BY MOUTH IN THE MORNING   Loperamide-Simethicone (IMODIUM ADVANCED PO) Take 4 mg by mouth as needed.   LORazepam (ATIVAN) 0.5 MG tablet Take 0.5 mg by mouth as directed. Every 24hrs as needed.   METOPROLOL TARTRATE PO Take 12.5 mg by mouth in the morning and at bedtime.   nitroGLYCERIN (NITROSTAT) 0.4 MG SL  tablet Place 1 tablet (0.4 mg total) under the tongue every 5 (five) minutes as needed for chest pain.   QUEtiapine (SEROQUEL) 25 MG tablet Take 1 tablet (25 mg total) by mouth daily in the afternoon. Give at 2 pm   simethicone (MYLICON) 80 MG chewable tablet Chew 80 mg by mouth every 12 (twelve) hours as needed for flatulence.   zinc oxide 20 % ointment Apply 1 Application topically as needed for irritation.   No facility-administered encounter medications on file as of 09/02/2022.    Review of Systems:  Review of Systems  Constitutional:  Negative for activity change, appetite change and unexpected weight change.  HENT:  Positive for rhinorrhea.   Respiratory:  Positive for cough. Negative for shortness of breath.   Cardiovascular:  Negative for leg swelling.  Gastrointestinal:  Negative for constipation.  Genitourinary:  Negative for frequency.  Musculoskeletal:  Positive for gait problem. Negative for arthralgias and myalgias.  Skin: Negative.  Negative for rash.  Neurological:  Negative for dizziness and weakness.  Psychiatric/Behavioral:  Positive for confusion. Negative for sleep disturbance.   All other systems reviewed and are negative.   Health Maintenance  Topic Date Due   Zoster Vaccines- Shingrix (1 of 2) Never done   COVID-19 Vaccine (4 - 2023-24 season) 02/23/2022   INFLUENZA VACCINE  09/23/2022   Medicare Annual Wellness (AWV)  12/19/2022   DTaP/Tdap/Td (4 - Td or Tdap) 01/23/2024   Pneumonia Vaccine 70+ Years old  Completed   HPV VACCINES  Aged Out   Colonoscopy  Discontinued    Physical Exam: Vitals:   09/02/22 1608  BP: (!) 143/87  Pulse: 84  Resp: 20  Temp: 97.6 F (36.4 C)  SpO2: 94%   There is no height or weight on file to calculate BMI. Physical Exam Vitals reviewed.  Constitutional:      Appearance: Normal appearance.  HENT:     Head: Normocephalic.     Nose: Nose normal.     Mouth/Throat:     Mouth: Mucous membranes are moist.      Pharynx: Oropharynx is clear.  Eyes:     Pupils: Pupils are equal, round, and reactive to light.  Cardiovascular:     Rate and Rhythm: Normal rate and regular rhythm.     Pulses: Normal pulses.     Heart sounds: No murmur heard. Pulmonary:     Effort: Pulmonary effort is normal. No respiratory distress.     Breath sounds: Normal breath sounds. No wheezing or rales.  Abdominal:     General: Abdomen is flat. Bowel sounds are normal.     Palpations: Abdomen is soft.  Musculoskeletal:        General: No swelling.     Cervical back: Neck supple.  Skin:    General: Skin is warm.  Neurological:     Mental Status: He is alert.  Psychiatric:        Mood and Affect: Mood normal.        Thought Content: Thought content normal.     Labs reviewed: Basic Metabolic Panel: Recent Labs    12/11/21 0000 02/18/22 0000 07/26/22 0000  NA 137 136* 137  K 4.2 4.6 4.0  CL 103 104 106  CO2 28* 24* 26*  BUN 21 23* 16  CREATININE 1.1 1.0 1.0  CALCIUM 8.7 8.5* 8.7  TSH  --   --  3.01   Liver Function Tests: Recent Labs    12/11/21 0000 07/26/22 0000  AST 17 14  ALT 16 9*  ALKPHOS 68 55  ALBUMIN 3.4* 3.1*   No results for input(s): "LIPASE", "AMYLASE" in the last 8760 hours. No results for input(s): "AMMONIA" in the last 8760 hours. CBC: Recent Labs    12/11/21 0000 02/18/22 0000 07/26/22 0000  WBC 5.1 6.9 4.3  NEUTROABS 3,738.00  --  2,877.00  HGB 12.1* 10.7* 11.6*  HCT 36* 32* 35*  PLT 239 182 145*   Lipid Panel: Recent Labs    12/11/21 0000  CHOL 122  HDL 51  LDLCALC 55  TRIG 75   Lab Results  Component Value Date   HGBA1C 5.6 07/01/2016    Procedures since last visit: No results found.  Assessment/Plan 1. Subacute cough Covid Negative Use Mucinex Prn for now If worsens will get Chest Xray Right now Chest exam is negative  2. Moderate vascular dementia  with agitation (HCC) On Seroquel and Depakote for behaviors  3. ADENOCARCINOMA, PROSTATE, GLEASON  GRADE 3 Is going to have Cystoscopy soon by Urology    Labs/tests ordered:  * No order type specified * Next appt:  Visit date not found

## 2022-09-03 ENCOUNTER — Other Ambulatory Visit: Payer: Self-pay | Admitting: Orthopedic Surgery

## 2022-09-03 DIAGNOSIS — F01B11 Vascular dementia, moderate, with agitation: Secondary | ICD-10-CM

## 2022-09-03 DIAGNOSIS — R41841 Cognitive communication deficit: Secondary | ICD-10-CM | POA: Diagnosis not present

## 2022-09-03 MED ORDER — LORAZEPAM 0.5 MG PO TABS
0.5000 mg | ORAL_TABLET | Freq: Every day | ORAL | 1 refills | Status: DC | PRN
Start: 2022-09-03 — End: 2022-10-04

## 2022-09-08 DIAGNOSIS — R41841 Cognitive communication deficit: Secondary | ICD-10-CM | POA: Diagnosis not present

## 2022-09-09 ENCOUNTER — Non-Acute Institutional Stay (SKILLED_NURSING_FACILITY): Payer: Medicare HMO | Admitting: Internal Medicine

## 2022-09-09 ENCOUNTER — Encounter: Payer: Self-pay | Admitting: Internal Medicine

## 2022-09-09 DIAGNOSIS — I10 Essential (primary) hypertension: Secondary | ICD-10-CM | POA: Diagnosis not present

## 2022-09-09 DIAGNOSIS — I2510 Atherosclerotic heart disease of native coronary artery without angina pectoris: Secondary | ICD-10-CM

## 2022-09-09 DIAGNOSIS — C61 Malignant neoplasm of prostate: Secondary | ICD-10-CM | POA: Diagnosis not present

## 2022-09-09 DIAGNOSIS — E039 Hypothyroidism, unspecified: Secondary | ICD-10-CM | POA: Diagnosis not present

## 2022-09-09 DIAGNOSIS — D509 Iron deficiency anemia, unspecified: Secondary | ICD-10-CM

## 2022-09-09 DIAGNOSIS — E785 Hyperlipidemia, unspecified: Secondary | ICD-10-CM | POA: Diagnosis not present

## 2022-09-09 DIAGNOSIS — K219 Gastro-esophageal reflux disease without esophagitis: Secondary | ICD-10-CM | POA: Diagnosis not present

## 2022-09-09 DIAGNOSIS — F01B11 Vascular dementia, moderate, with agitation: Secondary | ICD-10-CM

## 2022-09-09 NOTE — Progress Notes (Signed)
Location:  [facility] Room Number: 28A Place of Service:  SNF [phone]) Provider:  Einar Crow, MD  Octavia Heir, NP  Patient Care Team: Octavia Heir, NP as PCP - General (Adult Health Nurse Practitioner) Verner Chol, [facility] (Inactive) as Pharmacist (Pharmacist) Mahlon Gammon, MD as Consulting Physician (Internal Medicine)  Extended Emergency Contact Information Primary Emergency Contact: Jaskot,Geraldine Address: [address] [place]          [place] [address] [place] Home Phone: [phone] Relation: Spouse Secondary Emergency Contact: Colvin Caroli Mobile Phone: [phone] Relation: Daughter  Code Status:  DNR Goals of care: Advanced Directive information    09/09/2022   12:14 PM  Advanced Directives  Does Patient Have a Medical Advance Directive? Yes  Type of Estate agent of [place];Out of facility DNR (pink MOST or yellow form)  Does patient want to make changes to medical advance directive? No - Patient declined  Copy of Healthcare Power of Attorney in Chart? Yes - validated most recent copy scanned in chart (See row information)     Chief Complaint  Patient presents with   Medical Management of Chronic Issues    Patient is being seen for a routine visit    Immunizations    Patient is due for Shingles vaccine and Covid vaccine     HPI:  Pt is a 89 y.o. male seen today for medical management of chronic diseases.    Lives in SNF in [facility]  Patient Lives in SNF with his Wife Patient has a history of CAD with CABG x2, hypertension, hyperlipidemia, CHF, mitral valve repair 2015,  history of GI bleed and hypothyroidism   Prostate cancer diagnosed in 2012, underwent TURP in 2019 Cancer has locally  advanced and has affected left urethra.  Gets Stent placed Q 3 months Also on Lupron injections every 3 months Plan for Cystoscopy With Ureteroscope with Retrograde Pyelogram per Urology with  [facility]  Cognitive decline MRI 02/2021 shows small cortical infarcts  But recently also having behavior issues Last night was resisting care leading to stools smear everywhere Very paranoid  Most of behaviors are in evenings This morning he is back to his baseline Ambulates with his wheelchair His weight at stable   Past Medical History:  Diagnosis Date   Complication of anesthesia    "serious cognisance problems since my OR in January" (03/13/2013   Coronary artery disease    Diverticulosis    Hiatal hernia    Hx of echocardiogram    Echo 5/16:  Mild focal basal septal hypertrophy, EF 55%, mild AI, MV repair ok with trivial MR (mean 3 mmHg), mild LAE, mild RAE, PASP 35 mmHg   Hypertension    Hypothyroidism    Irritable bowel syndrome    PAF (paroxysmal atrial fibrillation) (HCC) 05/03/2014   Personal history of colonic polyps    tubular adenoma   Pleural effusion, bilateral 03/12/2013   Small to moderate, L>R   Prostate cancer [facility]) 1991   sees Dr. Gala Lewandowsky at [facility], observation only    S/P CABG x 2 02/28/2013   LIMA to LAD, SVG to OM1, EVH via right thigh   S/P Maze operation for atrial fibrillation 02/28/2013   Complete bilateral atrial lesion set using bipolar radiofrequency and cryothermy ablation with clipping of LA appendage   S/P mitral valve repair, maze procedure, and CABG x2 02/28/2013   Complex valvuloplasty including triangular resection of posterior leaflet, 30 mm Sorin Memo 3D  ring annuloplasty   Past Surgical History:  Procedure Laterality Date   CARDIAC CATHETERIZATION  2015   CATARACT EXTRACTION, BILATERAL Bilateral 2014   per Dr. Nile Riggs    COLONOSCOPY  2011   per Dr. Jarold Motto, benign polyps, no repeats needed    CORONARY ARTERY BYPASS GRAFT N/A 02/28/2013   Procedure: CORONARY ARTERY BYPASS GRAFTING (CABG);  Surgeon: Purcell Nails, MD;  Location: [facility] OR;  Service: Open Heart Surgery;  Laterality: N/A;  CABG x 2,  using left internal mammary artery and right  leg saphenous vein harvested endoscopically   ESOPHAGOGASTRODUODENOSCOPY (EGD) WITH PROPOFOL N/A 01/11/2019   Procedure: ESOPHAGOGASTRODUODENOSCOPY (EGD) WITH PROPOFOL;  Surgeon: Sherrilyn Rist, MD;  Location: [facility] ENDOSCOPY;  Service: Gastroenterology;  Laterality: N/A;   HEMORRHOID SURGERY     INTRAOPERATIVE TRANSESOPHAGEAL ECHOCARDIOGRAM N/A 02/28/2013   Procedure: INTRAOPERATIVE TRANSESOPHAGEAL ECHOCARDIOGRAM;  Surgeon: Purcell Nails, MD;  Location: [facility] OR;  Service: Open Heart Surgery;  Laterality: N/A;   LEFT HEART CATHETERIZATION WITH CORONARY ANGIOGRAM N/A 02/26/2013   Procedure: LEFT HEART CATHETERIZATION WITH CORONARY ANGIOGRAM;  Surgeon: Marykay Lex, MD;  Location: [facility] LAB;  Service: Cardiovascular;  Laterality: N/A;   MAZE N/A 02/28/2013   Procedure: MAZE;  Surgeon: Purcell Nails, MD;  Location: [facility] OR;  Service: Open Heart Surgery;  Laterality: N/A;   MITRAL VALVE REPAIR N/A 02/28/2013   Procedure: MITRAL VALVE REPAIR (MVR);  Surgeon: Purcell Nails, MD;  Location: [facility] OR;  Service: Open Heart Surgery;  Laterality: N/A;   PROSTATE BIOPSY     TRANSURETHRAL RESECTION OF PROSTATE N/A 02/04/2017   Procedure: TRANSURETHRAL RESECTION OF THE PROSTATE (TURP);  Surgeon: Sebastian Ache, MD;  Location: [facility] ORS;  Service: Urology;  Laterality: N/A;    Allergies  Allergen Reactions   Other Other (See Comments)    ANESTHESIA.... 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 ANESTHESIA.... 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 Occurred post op CABG 2015, required readmission and rehab    Outpatient Encounter Medications as of 09/09/2022  Medication Sig   acetaminophen (TYLENOL) 500 MG tablet Take 2 tablets (1,000 mg total) by mouth 2 (two) times daily as needed.   atorvastatin (LIPITOR) 10 MG tablet TAKE 1 TABLET EVERY DAY AT 6PM   Cholecalciferol (VITAMIN D3 PO) Take 2,000 Units by mouth daily.   Cyanocobalamin 2500 MCG SUBL Place 1 tablet under the  tongue daily.   divalproex (DEPAKOTE SPRINKLES) 125 MG capsule Take 1 capsule (125 mg total) by mouth daily.   ferrous sulfate 325 (65 FE) MG EC tablet TAKE 1 TABLET BY MOUTH EVERY DAY   fish oil-omega-3 fatty acids 1000 MG capsule Take 1 g by mouth every morning.   guaiFENesin (MUCINEX) 600 MG 12 hr tablet Take by mouth 2 (two) times daily.   levothyroxine (SYNTHROID) 50 MCG tablet TAKE 1 TABLET BY MOUTH IN THE MORNING   Loperamide-Simethicone (IMODIUM ADVANCED PO) Take 4 mg by mouth as needed.   LORazepam (ATIVAN) 0.5 MG tablet Take 1 tablet (0.5 mg total) by mouth daily as needed for anxiety. Every 24hrs as needed.   METOPROLOL TARTRATE PO Take 12.5 mg by mouth in the morning and at bedtime.   nitroGLYCERIN (NITROSTAT) 0.4 MG SL tablet Place 1 tablet (0.4 mg total) under the tongue every 5 (five) minutes as needed for chest pain.   QUEtiapine (SEROQUEL) 25 MG tablet Take 1 tablet (25 mg total) by mouth daily in the afternoon. Give at 2  pm   simethicone (MYLICON) 80 MG chewable tablet Chew 80 mg by mouth every 12 (twelve) hours as needed for flatulence.   zinc oxide 20 % ointment Apply 1 Application topically as needed for irritation.   No facility-administered encounter medications on file as of 09/09/2022.    Review of Systems  Constitutional:  Negative for activity change, appetite change and unexpected weight change.  HENT: Negative.    Respiratory:  Positive for cough. Negative for shortness of breath.   Cardiovascular:  Negative for leg swelling.  Gastrointestinal:  Negative for constipation.  Genitourinary:  Negative for frequency.  Musculoskeletal:  Positive for gait problem. Negative for arthralgias and myalgias.  Skin: Negative.  Negative for rash.  Neurological:  Negative for dizziness and weakness.  Psychiatric/Behavioral:  Positive for behavioral problems and confusion. Negative for sleep disturbance.   All other systems reviewed and are negative.   Immunization History   Administered Date(s) Administered   Fluad Quad(high Dose 65+) 11/21/2018, 12/25/2019, 01/02/2021, 12/16/2021   Influenza Split 12/15/2006, 12/06/2007, 12/31/2009, 11/01/2011   Influenza Whole 12/15/2006, 12/06/2007, 12/31/2009   Influenza, High Dose Seasonal PF 11/11/2014, 11/12/2015, 11/15/2016, 11/18/2017, 11/21/2018, 01/02/2021   Influenza,inj,Quad PF,6+ Mos 11/01/2012, 11/09/2013   Influenza-Unspecified 11/01/2011, 11/01/2012, 11/09/2013   Moderna SARS-COV2 Booster Vaccination 01/07/2020   Moderna Sars-Covid-2 Vaccination 02/26/2019, 03/26/2019   PFIZER(Purple Top)SARS-COV-2 Vaccination 12/29/2021   Pneumococcal Conjugate-13 11/09/2013   Pneumococcal Polysaccharide-23 02/23/2004   Td 02/22/2002   Td (Adult),5 Lf Tetanus Toxid, Preservative Free 02/22/2002   Tdap 01/22/2014   Pertinent  Health Maintenance Due  Topic Date Due   INFLUENZA VACCINE  09/23/2022   Colonoscopy  Discontinued      12/18/2021   10:15 AM 01/25/2022    1:55 PM 02/05/2022    9:54 AM 05/31/2022    3:57 PM 08/11/2022    2:32 PM  Fall Risk  Falls in the past year? 1 1 1 1  0  Was there an injury with Fall? 0 0 0 0 0  Fall Risk Category Calculator 2 2 2 2  0  Fall Risk Category (Retired) Moderate Moderate Moderate    (RETIRED) Patient Fall Risk Level Moderate fall risk Moderate fall risk Moderate fall risk    Patient at Risk for Falls Due to History of fall(s);Impaired balance/gait;Impaired mobility History of fall(s);Impaired balance/gait;Impaired mobility History of fall(s);Impaired balance/gait;Impaired mobility History of fall(s);Impaired balance/gait;Impaired mobility;Mental status change History of fall(s);Impaired balance/gait;Impaired mobility;Mental status change  Fall risk Follow up Falls evaluation completed;Education provided;Falls prevention discussed Falls evaluation completed Falls evaluation completed Falls evaluation completed;Education provided;Falls prevention discussed Falls evaluation  completed;Education provided;Falls prevention discussed   Functional Status Survey:    Vitals:   09/09/22 1150  BP: 127/63  Pulse: 63  Resp: 17  Temp: 97.6 F (36.4 C)  TempSrc: Temporal  SpO2: 98%  Weight: 140 lb 11.2 oz (63.8 kg)  Height: 5\' 9"  (1.753 m)   Body mass index is 20.78 kg/m. Physical Exam Vitals reviewed.  Constitutional:      Appearance: Normal appearance.  HENT:     Head: Normocephalic.     Nose: Nose normal.     Mouth/Throat:     Mouth: Mucous membranes are moist.     Pharynx: Oropharynx is clear.  Eyes:     Pupils: Pupils are equal, round, and reactive to light.  Cardiovascular:     Rate and Rhythm: Normal rate and regular rhythm.     Pulses: Normal pulses.     Heart sounds: No murmur heard. Pulmonary:  Effort: Pulmonary effort is normal. No respiratory distress.     Breath sounds: Normal breath sounds. No rales.  Abdominal:     General: Abdomen is flat. Bowel sounds are normal.     Palpations: Abdomen is soft.  Musculoskeletal:        General: No swelling.     Cervical back: Neck supple.  Skin:    General: Skin is warm.  Neurological:     General: No focal deficit present.     Mental Status: He is alert.  Psychiatric:        Mood and Affect: Mood normal.        Thought Content: Thought content normal.     Labs reviewed: Recent Labs    12/11/21 0000 02/18/22 0000 07/26/22 0000  NA 137 136* 137  K 4.2 4.6 4.0  CL 103 104 106  CO2 28* 24* 26*  BUN 21 23* 16  CREATININE 1.1 1.0 1.0  CALCIUM 8.7 8.5* 8.7   Recent Labs    12/11/21 0000 07/26/22 0000  AST 17 14  ALT 16 9*  ALKPHOS 68 55  ALBUMIN 3.4* 3.1*   Recent Labs    12/11/21 0000 02/18/22 0000 07/26/22 0000  WBC 5.1 6.9 4.3  NEUTROABS 3,738.00  --  2,877.00  HGB 12.1* 10.7* 11.6*  HCT 36* 32* 35*  PLT 239 182 145*   Lab Results  Component Value Date   TSH 3.01 07/26/2022   Lab Results  Component Value Date   HGBA1C 5.6 07/01/2016   Lab Results   Component Value Date   CHOL 122 12/11/2021   HDL 51 12/11/2021   LDLCALC 55 12/11/2021   LDLDIRECT 159.3 05/17/2007   TRIG 75 12/11/2021   CHOLHDL 2 01/28/2021    Significant Diagnostic Results in last 30 days:  No results found.  Assessment/Plan 1. Moderate vascular dementia with agitation (HCC) Change Depakote 250 mg Will start on Lexapro 5 mg Check BMP Already on Seroquel Also Continue Ativan PRN  2. Essential hypertension Low dose of Metoprolol  3. Acquired hypothyroidism TSH normal in 07/2022  4. ADENOCARCINOMA, PROSTATE, GLEASON GRADE 3 Per Urology on Lupron injection And will undergo Cystoscopy again this month  5. Coronary artery disease s/p CABG ( MV repair and MAZE 2015 On statin and low dose  beta blocker for PACS and PVC  6. Iron deficiency anemia, unspecified iron deficiency anemia type On Iron  HGB stable  7. B 12 def On Supplement   8. Hyperlipidemia, unspecified hyperlipidemia type Statin LDL 55 in 2023    Family/ staff Communication:   Labs/tests ordered:

## 2022-09-10 DIAGNOSIS — R41841 Cognitive communication deficit: Secondary | ICD-10-CM | POA: Diagnosis not present

## 2022-09-10 DIAGNOSIS — M79671 Pain in right foot: Secondary | ICD-10-CM | POA: Diagnosis not present

## 2022-09-10 DIAGNOSIS — B351 Tinea unguium: Secondary | ICD-10-CM | POA: Diagnosis not present

## 2022-09-10 DIAGNOSIS — M79672 Pain in left foot: Secondary | ICD-10-CM | POA: Diagnosis not present

## 2022-09-14 DIAGNOSIS — R41841 Cognitive communication deficit: Secondary | ICD-10-CM | POA: Diagnosis not present

## 2022-09-15 DIAGNOSIS — N1330 Unspecified hydronephrosis: Secondary | ICD-10-CM | POA: Diagnosis not present

## 2022-09-15 DIAGNOSIS — C61 Malignant neoplasm of prostate: Secondary | ICD-10-CM | POA: Diagnosis not present

## 2022-09-15 DIAGNOSIS — E039 Hypothyroidism, unspecified: Secondary | ICD-10-CM | POA: Diagnosis not present

## 2022-09-15 DIAGNOSIS — Z466 Encounter for fitting and adjustment of urinary device: Secondary | ICD-10-CM | POA: Diagnosis not present

## 2022-09-15 DIAGNOSIS — K8020 Calculus of gallbladder without cholecystitis without obstruction: Secondary | ICD-10-CM | POA: Diagnosis not present

## 2022-09-15 DIAGNOSIS — N135 Crossing vessel and stricture of ureter without hydronephrosis: Secondary | ICD-10-CM | POA: Diagnosis not present

## 2022-09-15 DIAGNOSIS — I480 Paroxysmal atrial fibrillation: Secondary | ICD-10-CM | POA: Diagnosis not present

## 2022-09-15 DIAGNOSIS — K219 Gastro-esophageal reflux disease without esophagitis: Secondary | ICD-10-CM | POA: Diagnosis not present

## 2022-09-15 DIAGNOSIS — I2510 Atherosclerotic heart disease of native coronary artery without angina pectoris: Secondary | ICD-10-CM | POA: Diagnosis not present

## 2022-09-15 DIAGNOSIS — Z79899 Other long term (current) drug therapy: Secondary | ICD-10-CM | POA: Diagnosis not present

## 2022-09-15 DIAGNOSIS — I10 Essential (primary) hypertension: Secondary | ICD-10-CM | POA: Diagnosis not present

## 2022-09-15 DIAGNOSIS — I440 Atrioventricular block, first degree: Secondary | ICD-10-CM | POA: Diagnosis not present

## 2022-09-16 DIAGNOSIS — I10 Essential (primary) hypertension: Secondary | ICD-10-CM | POA: Diagnosis not present

## 2022-09-16 DIAGNOSIS — E785 Hyperlipidemia, unspecified: Secondary | ICD-10-CM | POA: Diagnosis not present

## 2022-09-16 LAB — HEPATIC FUNCTION PANEL
ALT: 10 U/L (ref 10–40)
AST: 12 — AB (ref 14–40)
Alkaline Phosphatase: 52 (ref 25–125)
Bilirubin, Total: 0.5

## 2022-09-16 LAB — BASIC METABOLIC PANEL
BUN: 20 (ref 4–21)
CO2: 29 — AB (ref 13–22)
Chloride: 104 (ref 99–108)
Creatinine: 1.1 (ref 0.6–1.3)
Glucose: 100
Potassium: 4 mEq/L (ref 3.5–5.1)
Sodium: 138 (ref 137–147)

## 2022-09-16 LAB — COMPREHENSIVE METABOLIC PANEL
Albumin: 2.9 — AB (ref 3.5–5.0)
Calcium: 8.5 — AB (ref 8.7–10.7)
Globulin: 2.4

## 2022-09-17 DIAGNOSIS — R41841 Cognitive communication deficit: Secondary | ICD-10-CM | POA: Diagnosis not present

## 2022-09-21 DIAGNOSIS — R41841 Cognitive communication deficit: Secondary | ICD-10-CM | POA: Diagnosis not present

## 2022-09-22 DIAGNOSIS — R41841 Cognitive communication deficit: Secondary | ICD-10-CM | POA: Diagnosis not present

## 2022-09-24 DIAGNOSIS — R41841 Cognitive communication deficit: Secondary | ICD-10-CM | POA: Diagnosis not present

## 2022-09-28 DIAGNOSIS — R41841 Cognitive communication deficit: Secondary | ICD-10-CM | POA: Diagnosis not present

## 2022-09-29 DIAGNOSIS — R41841 Cognitive communication deficit: Secondary | ICD-10-CM | POA: Diagnosis not present

## 2022-10-01 DIAGNOSIS — R41841 Cognitive communication deficit: Secondary | ICD-10-CM | POA: Diagnosis not present

## 2022-10-04 ENCOUNTER — Encounter: Payer: Self-pay | Admitting: Orthopedic Surgery

## 2022-10-04 ENCOUNTER — Non-Acute Institutional Stay (SKILLED_NURSING_FACILITY): Payer: Medicare HMO | Admitting: Orthopedic Surgery

## 2022-10-04 DIAGNOSIS — R339 Retention of urine, unspecified: Secondary | ICD-10-CM | POA: Diagnosis not present

## 2022-10-04 DIAGNOSIS — I2510 Atherosclerotic heart disease of native coronary artery without angina pectoris: Secondary | ICD-10-CM | POA: Diagnosis not present

## 2022-10-04 DIAGNOSIS — I10 Essential (primary) hypertension: Secondary | ICD-10-CM

## 2022-10-04 DIAGNOSIS — C61 Malignant neoplasm of prostate: Secondary | ICD-10-CM | POA: Diagnosis not present

## 2022-10-04 DIAGNOSIS — E039 Hypothyroidism, unspecified: Secondary | ICD-10-CM | POA: Diagnosis not present

## 2022-10-04 DIAGNOSIS — D509 Iron deficiency anemia, unspecified: Secondary | ICD-10-CM | POA: Diagnosis not present

## 2022-10-04 DIAGNOSIS — F01B2 Vascular dementia, moderate, with psychotic disturbance: Secondary | ICD-10-CM | POA: Diagnosis not present

## 2022-10-04 NOTE — Progress Notes (Signed)
Location:   [facility] Room Number: 28-A Place of Service:  SNF (31) Provider:  Hazle Nordmann, NP    Patient Care Team: Octavia Heir, NP as PCP - General (Adult Health Nurse Practitioner) Verner Chol, [facility] (Inactive) as Pharmacist (Pharmacist) Mahlon Gammon, MD as Consulting Physician (Internal Medicine)  Extended Emergency Contact Information Primary Emergency Contact: Dineen,Geraldine Address: [address] [place]           [address] [place] Home Phone: [phone] Relation: Spouse Secondary Emergency Contact: Colvin Caroli Mobile Phone: [phone] Relation: Daughter  Code Status:  DNR Goals of care: Advanced Directive information    10/04/2022   12:22 PM  Advanced Directives  Does Patient Have a Medical Advance Directive? Yes  Type of Estate agent of [place];Out of facility DNR (pink MOST or yellow form)  Does patient want to make changes to medical advance directive? No - Patient declined  Copy of Healthcare Power of Attorney in Chart? Yes - validated most recent copy scanned in chart (See row information)     Chief Complaint  Patient presents with   Medical Management of Chronic Issues    Routine Visit.    Immunizations    Discuss the need for Shingrix vaccine, Covid Booster, and Influenza vaccine.    HPI:  Pt is a 89 y.o. male seen today for medical management of chronic diseases.    PMH: CAD with CABG x 2, HTN, HLD, CHF, mitral valve repair 2015, prostate cancer, TURP 2018, left ureteroscopy with left stent placement 10/2021, Diverticulosis, GERD, GI bleed, hypothyroidism, osteopenia, and falls.   HTN- BUN/creat 20/1.1 09/16/2022, remains on metoprolol CAD- LDL 55 12/11/2021, unable to tolerate aspirin due to past GI bleed, remains on Lipitor Vascular dementia with agitation- 02/2021 MRI brain indicated small chronic cortical infarcts within left frontal/parietal lobes,moderate cerebral  white matter chronic small vessel ischemic disease, 07/2022 increased sundowning with delusions, Zyprexa ineffective, now on Depakote and Seroquel> improved behaviors Prostate cancer/ Urethral obstruction- followed by Dr. Merry Lofty, diagnosed 2012, TURP 2019, cancer now affecting left urethra- 07/24 stent exchange> tolerated well> no hematuria, remains on Lupron injections Q3 months Hypothyroidism- TSH 3. 01 07/26/2022, remains on levothyroxine Iron deficiency anemia- Hgb 12.2 09/15/2022, remains on ferrous sulfate/ B12  08/14 moving to [facility]- SNF.   No recent falls or injuries.   Recent weights:  07/29- 147.8 lbs  06/01- 140.7 lbs  04/01- 137.5 lbs  Recent blood pressures:  08/06- 142/79  07/30- 117/62  07/23- 97/56     Past Medical History:  Diagnosis Date   Complication of anesthesia    "serious cognisance problems since my OR in January" (03/13/2013   Coronary artery disease    Diverticulosis    Hiatal hernia    Hx of echocardiogram    Echo 5/16:  Mild focal basal septal hypertrophy, EF 55%, mild AI, MV repair ok with trivial MR (mean 3 mmHg), mild LAE, mild RAE, PASP 35 mmHg   Hypertension    Hypothyroidism    Irritable bowel syndrome    PAF (paroxysmal atrial fibrillation) (HCC) 05/03/2014   Personal history of colonic polyps    tubular adenoma   Pleural effusion, bilateral 03/12/2013   Small to moderate, L>R   Prostate cancer [facility]) 1991   sees Dr. Gala Lewandowsky at [facility], observation only    S/P CABG x 2 02/28/2013   LIMA to LAD, SVG to OM1, EVH via right thigh   S/P Maze operation  for atrial fibrillation 02/28/2013   Complete bilateral atrial lesion set using bipolar radiofrequency and cryothermy ablation with clipping of LA appendage   S/P mitral valve repair, maze procedure, and CABG x2 02/28/2013   Complex valvuloplasty including triangular resection of posterior leaflet, 30 mm Sorin Memo 3D ring annuloplasty   Past Surgical History:  Procedure Laterality  Date   CARDIAC CATHETERIZATION  2015   CATARACT EXTRACTION, BILATERAL Bilateral 2014   per Dr. Nile Riggs    COLONOSCOPY  2011   per Dr. Jarold Motto, benign polyps, no repeats needed    CORONARY ARTERY BYPASS GRAFT N/A 02/28/2013   Procedure: CORONARY ARTERY BYPASS GRAFTING (CABG);  Surgeon: Purcell Nails, MD;  Location: [facility] OR;  Service: Open Heart Surgery;  Laterality: N/A;  CABG x 2,  using left internal mammary artery and right leg saphenous vein harvested endoscopically   ESOPHAGOGASTRODUODENOSCOPY (EGD) WITH PROPOFOL N/A 01/11/2019   Procedure: ESOPHAGOGASTRODUODENOSCOPY (EGD) WITH PROPOFOL;  Surgeon: Sherrilyn Rist, MD;  Location: [facility] ENDOSCOPY;  Service: Gastroenterology;  Laterality: N/A;   HEMORRHOID SURGERY     INTRAOPERATIVE TRANSESOPHAGEAL ECHOCARDIOGRAM N/A 02/28/2013   Procedure: INTRAOPERATIVE TRANSESOPHAGEAL ECHOCARDIOGRAM;  Surgeon: Purcell Nails, MD;  Location: [facility] OR;  Service: Open Heart Surgery;  Laterality: N/A;   LEFT HEART CATHETERIZATION WITH CORONARY ANGIOGRAM N/A 02/26/2013   Procedure: LEFT HEART CATHETERIZATION WITH CORONARY ANGIOGRAM;  Surgeon: Marykay Lex, MD;  Location[facility] C[facility] LAB;  Service: Cardiovascular;  Laterality: N/A;   MAZE N/A 02/28/2013   Procedure: MAZE;  Surgeon: Purcell Nails, MD;  Location: [facility] OR;  Service: Open Heart Surgery;  Laterality: N/A;   MITRAL VALVE REPAIR N/A 02/28/2013   Procedure: MITRAL VALVE REPAIR (MVR);  Surgeon: Purcell Nails, MD;  Location: [facility] OR;  Service: Open Heart Surgery;  Laterality: N/A;   PROSTATE BIOPSY     TRANSURETHRAL RESECTION OF PROSTATE N/A 02/04/2017   Procedure: TRANSURETHRAL RESECTION OF THE PROSTATE (TURP);  Surgeon: Sebastian Ache, MD;  Location: [facility] ORS;  Service: Urology;  Laterality: N/A;    Allergies  Allergen Reactions   Other Other (See Comments)    ANESTHESIA.... 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 ANESTHESIA.... Gives him like "Harlene Salts Syndrome" per family  member. Occurred post op CABG 2015, required readmission and rehab    Allergies as of 10/04/2022       Reactions   Other Other (See Comments)   ANESTHESIA.... 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 ANESTHESIA.... 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 Occurred post op CABG 2015, required readmission and rehab        Medication List        Accurate as of October 04, 2022 12:22 PM. If you have any questions, ask your nurse or doctor.          STOP taking these medications    guaiFENesin 600 MG 12 hr tablet Commonly known as: MUCINEX Stopped by: Luberta Robertson    LORazepam 0.5 MG tablet Commonly known as: ATIVAN Stopped by: Octavia Heir       TAKE these medications    acetaminophen 500 MG tablet Commonly known as: TYLENOL Take 2 tablets (1,000 mg total) by mouth 2 (two) times daily as needed.   atorvastatin 10 MG tablet Commonly known as: LIPITOR TAKE 1 TABLET EVERY DAY AT 6PM   Cyanocobalamin 2500 MCG Subl Place 1 tablet under the tongue daily.   divalproex 125 MG DR tablet Commonly known as:  DEPAKOTE Take 250 mg by mouth at bedtime. What changed: Another medication with the same name was removed. Continue taking this medication, and follow the directions you see here. Changed by: Octavia Heir   escitalopram 5 MG tablet Commonly known as: LEXAPRO Take 5 mg by mouth daily.   ferrous sulfate 325 (65 FE) MG EC tablet TAKE 1 TABLET BY MOUTH EVERY DAY   fish oil-omega-3 fatty acids 1000 MG capsule Take 1 g by mouth every morning.   IMODIUM ADVANCED PO Take 4 mg by mouth as needed.   levothyroxine 50 MCG tablet Commonly known as: SYNTHROID TAKE 1 TABLET BY MOUTH IN THE MORNING   METOPROLOL TARTRATE PO Take 12.5 mg by mouth in the morning and at bedtime.   nitroGLYCERIN 0.4 MG SL tablet Commonly known as: NITROSTAT Place 1 tablet (0.4 mg total) under the tongue every 5 (five) minutes as needed for chest pain.    QUEtiapine 25 MG tablet Commonly known as: SEROquel Take 1 tablet (25 mg total) by mouth daily in the afternoon. Give at 2 pm   simethicone 80 MG chewable tablet Commonly known as: MYLICON Chew 80 mg by mouth every 12 (twelve) hours as needed for flatulence.   VITAMIN D3 PO Take 2,000 Units by mouth daily.   zinc oxide 20 % ointment Apply 1 Application topically as needed for irritation.        Review of Systems  Unable to perform ROS: Dementia    Immunization History  Administered Date(s) Administered   Fluad Quad(high Dose 65+) 11/21/2018, 12/25/2019, 01/02/2021, 12/16/2021   Influenza Split 12/15/2006, 12/06/2007, 12/31/2009, 11/01/2011   Influenza Whole 12/15/2006, 12/06/2007, 12/31/2009   Influenza, High Dose Seasonal PF 11/11/2014, 11/12/2015, 11/15/2016, 11/18/2017, 11/21/2018, 01/02/2021   Influenza,inj,Quad PF,6+ Mos 11/01/2012, 11/09/2013   Influenza-Unspecified 11/01/2011, 11/01/2012, 11/09/2013   Moderna SARS-COV2 Booster Vaccination 01/07/2020   Moderna Sars-Covid-2 Vaccination 02/26/2019, 03/26/2019   PFIZER(Purple Top)SARS-COV-2 Vaccination 12/29/2021   Pneumococcal Conjugate-13 11/09/2013   Pneumococcal Polysaccharide-23 02/23/2004   Td 02/22/2002   Td (Adult),5 Lf Tetanus Toxid, Preservative Free 02/22/2002   Tdap 01/22/2014   Pertinent  Health Maintenance Due  Topic Date Due   INFLUENZA VACCINE  09/23/2022   Colonoscopy  Discontinued      12/18/2021   10:15 AM 01/25/2022    1:55 PM 02/05/2022    9:54 AM 05/31/2022    3:57 PM 08/11/2022    2:32 PM  Fall Risk  Falls in the past year? 1 1 1 1  0  Was there an injury with Fall? 0 0 0 0 0  Fall Risk Category Calculator 2 2 2 2  0  Fall Risk Category (Retired) Moderate Moderate Moderate    (RETIRED) Patient Fall Risk Level Moderate fall risk Moderate fall risk Moderate fall risk    Patient at Risk for Falls Due to History of fall(s);Impaired balance/gait;Impaired mobility History of fall(s);Impaired  balance/gait;Impaired mobility History of fall(s);Impaired balance/gait;Impaired mobility History of fall(s);Impaired balance/gait;Impaired mobility;Mental status change History of fall(s);Impaired balance/gait;Impaired mobility;Mental status change  Fall risk Follow up Falls evaluation completed;Education provided;Falls prevention discussed Falls evaluation completed Falls evaluation completed Falls evaluation completed;Education provided;Falls prevention discussed Falls evaluation completed;Education provided;Falls prevention discussed   Functional Status Survey:    Vitals:   10/04/22 1215  BP: (!) 142/79  Pulse: 68  Resp: 17  Temp: (!) 96.6 F (35.9 C)  SpO2: 97%  Weight: 147 lb 12.8 oz (67 kg)  Height: 5\' 9"  (1.753 m)   Body mass index is 21.83 kg/m.  Physical Exam Vitals reviewed.  Constitutional:      General: He is not in acute distress. HENT:     Head: Normocephalic.     Right Ear: There is no impacted cerumen.     Left Ear: There is no impacted cerumen.     Nose: Nose normal.     Mouth/Throat:     Mouth: Mucous membranes are moist.  Eyes:     General:        Right eye: No discharge.        Left eye: No discharge.  Cardiovascular:     Rate and Rhythm: Normal rate and regular rhythm.     Pulses: Normal pulses.     Heart sounds: Normal heart sounds.  Pulmonary:     Effort: Pulmonary effort is normal. No respiratory distress.     Breath sounds: Normal breath sounds. No wheezing or rales.  Abdominal:     General: Bowel sounds are normal.     Palpations: Abdomen is soft.  Musculoskeletal:     Cervical back: Neck supple.     Right lower leg: No edema.     Left lower leg: No edema.  Skin:    General: Skin is warm.     Capillary Refill: Capillary refill takes less than 2 seconds.  Neurological:     General: No focal deficit present.     Mental Status: He is alert. Mental status is at baseline.     Motor: Weakness present.     Gait: Gait abnormal.     Comments:  wheelchair  Psychiatric:        Mood and Affect: Mood normal.     Comments: Very pleasant, follows commands, alert to self/person/place     Labs reviewed: Recent Labs    02/18/22 0000 07/26/22 0000 09/16/22 0000  NA 136* 137 138  K 4.6 4.0 4.0  CL 104 106 104  CO2 24* 26* 29*  BUN 23* 16 20  CREATININE 1.0 1.0 1.1  CALCIUM 8.5* 8.7 8.5*   Recent Labs    12/11/21 0000 07/26/22 0000 09/16/22 0000  AST 17 14 12*  ALT 16 9* 10  ALKPHOS 68 55 52  ALBUMIN 3.4* 3.1* 2.9*   Recent Labs    12/11/21 0000 02/18/22 0000 07/26/22 0000  WBC 5.1 6.9 4.3  NEUTROABS 3,738.00  --  2,877.00  HGB 12.1* 10.7* 11.6*  HCT 36* 32* 35*  PLT 239 182 145*   Lab Results  Component Value Date   TSH 3.01 07/26/2022   Lab Results  Component Value Date   HGBA1C 5.6 07/01/2016   Lab Results  Component Value Date   CHOL 122 12/11/2021   HDL 51 12/11/2021   LDLCALC 55 12/11/2021   LDLDIRECT 159.3 05/17/2007   TRIG 75 12/11/2021   CHOLHDL 2 01/28/2021    Significant Diagnostic Results in last 30 days:  No results found.  Assessment/Plan 1. Essential hypertension - controlled with metoprolol  2. Coronary artery disease involving native coronary artery of native heart without angina pectoris - cont atorvastatin  3. Moderate vascular dementia with psychotic disturbance (HCC) - 07/2022 increased sundowning/ delusions - Zyprexa ineffective - doing well with Depakote and Seroquel> ALT/AST stable - weights stable - cont skilled nursing  4. ADENOCARCINOMA, PROSTATE, GLEASON GRADE 3 - followed by Dr. Merry Lofty - no hematuria/retention per nursing - 07/24- left stent exchange - cont Lupron injections q 3 months  5. Urinary retention - see above  6. Acquired hypothyroidism - TSH  stable - cont levothyroxine  7. Iron deficiency anemia, unspecified iron deficiency anemia type - hgb stable - cont ferrous sulfate    Family/ staff Communication: plan discussed with patient  and nurse  Labs/tests ordered: none

## 2022-10-05 DIAGNOSIS — C61 Malignant neoplasm of prostate: Secondary | ICD-10-CM | POA: Diagnosis not present

## 2022-10-05 DIAGNOSIS — R41841 Cognitive communication deficit: Secondary | ICD-10-CM | POA: Diagnosis not present

## 2022-10-06 ENCOUNTER — Encounter: Payer: Self-pay | Admitting: Adult Health

## 2022-10-06 ENCOUNTER — Non-Acute Institutional Stay (SKILLED_NURSING_FACILITY): Payer: Medicare HMO | Admitting: Adult Health

## 2022-10-06 DIAGNOSIS — D509 Iron deficiency anemia, unspecified: Secondary | ICD-10-CM | POA: Diagnosis not present

## 2022-10-06 DIAGNOSIS — I10 Essential (primary) hypertension: Secondary | ICD-10-CM | POA: Diagnosis not present

## 2022-10-06 DIAGNOSIS — E039 Hypothyroidism, unspecified: Secondary | ICD-10-CM

## 2022-10-06 DIAGNOSIS — F01B2 Vascular dementia, moderate, with psychotic disturbance: Secondary | ICD-10-CM | POA: Diagnosis not present

## 2022-10-06 DIAGNOSIS — C61 Malignant neoplasm of prostate: Secondary | ICD-10-CM | POA: Diagnosis not present

## 2022-10-06 NOTE — Progress Notes (Signed)
Location:  [facility] Room Number: 16A Place of Service:  SNF (31) Provider:  Kenard Gower, DNP, FNP-BC  Patient Care Team: Octavia Heir, NP as PCP - General (Adult Health Nurse Practitioner) Verner Chol, [facility] (Inactive) as Pharmacist (Pharmacist) Mahlon Gammon, MD as Consulting Physician (Internal Medicine)  Extended Emergency Contact Information Primary Emergency Contact: Ghanem,Geraldine Address: [address] [place]          [place] [address] [place] Home Phone: [phone] Relation: Spouse Secondary Emergency Contact: Colvin Caroli Mobile Phone: [phone] Relation: Daughter  Code Status:   Full Code  Goals of care: Advanced Directive information    10/04/2022   12:22 PM  Advanced Directives  Does Patient Have a Medical Advance Directive? Yes  Type of Estate agent of [place];Out of facility DNR (pink MOST or yellow form)  Does patient want to make changes to medical advance directive? No - Patient declined  Copy of Healthcare Power of Attorney in Chart? Yes - validated most recent copy scanned in chart (See row information)     Chief Complaint  Patient presents with   Acute Visit    Admission to [facility]    HPI:  Pt is a 89 y.o. male who was admitted to [facility] today from [facility]. He was admitted to [facility] with his wife.   Moderate dementia with agitation, unspecified dementia type (HCC) -  takes Seroquel and Lexapro  Acquired hypothyroidism -  takes Levothyroxine  Hypertension -  takes Metoprolol tartrate  ADENOCARCINOMA, PROSTATE, GLEASON GRADE 3 -  takes Lupron -  takes Lupron Q 3 months at Urology  Iron deficiency anemia, unspecified iron deficiency anemia type -  takes Ferrous sulfaye    Past Medical History:  Diagnosis Date   Complication of anesthesia    "serious cognisance problems since my OR in January" (03/13/2013    Coronary artery disease    Diverticulosis    Hiatal hernia    Hx of echocardiogram    Echo 5/16:  Mild focal basal septal hypertrophy, EF 55%, mild AI, MV repair ok with trivial MR (mean 3 mmHg), mild LAE, mild RAE, PASP 35 mmHg   Hypertension    Hypothyroidism    Irritable bowel syndrome    PAF (paroxysmal atrial fibrillation) (HCC) 05/03/2014   Personal history of colonic polyps    tubular adenoma   Pleural effusion, bilateral 03/12/2013   Small to moderate, L>R   Prostate cancer [facility]) 1991   sees Dr. Gala Lewandowsky at [facility], observation only    S/P CABG x 2 02/28/2013   LIMA to LAD, SVG to OM1, EVH via right thigh   S/P Maze operation for atrial fibrillation 02/28/2013   Complete bilateral atrial lesion set using bipolar radiofrequency and cryothermy ablation with clipping of LA appendage   S/P mitral valve repair, maze procedure, and CABG x2 02/28/2013   Complex valvuloplasty including triangular resection of posterior leaflet, 30 mm Sorin Memo 3D ring annuloplasty   Past Surgical History:  Procedure Laterality Date   CARDIAC CATHETERIZATION  2015   CATARACT EXTRACTION, BILATERAL Bilateral 2014   per Dr. Nile Riggs    COLONOSCOPY  2011   per Dr. Jarold Motto, benign polyps, no repeats needed    CORONARY ARTERY BYPASS GRAFT N/A 02/28/2013   Procedure: CORONARY ARTERY BYPASS GRAFTING (CABG);  Surgeon: Purcell Nails, MD;  Location: [facility] OR;  Service: Open Heart Surgery;  Laterality: N/A;  CABG x 2,  using left internal mammary artery and right leg saphenous vein harvested endoscopically   ESOPHAGOGASTRODUODENOSCOPY (EGD) WITH PROPOFOL N/A 01/11/2019   Procedure: ESOPHAGOGASTRODUODENOSCOPY (EGD) WITH PROPOFOL;  Surgeon: Sherrilyn Rist, MD;  Location: [facility] ENDOSCOPY;  Service: Gastroenterology;  Laterality: N/A;   HEMORRHOID SURGERY     INTRAOPERATIVE TRANSESOPHAGEAL ECHOCARDIOGRAM N/A 02/28/2013   Procedure: INTRAOPERATIVE TRANSESOPHAGEAL ECHOCARDIOGRAM;  Surgeon: Purcell Nails, MD;  Location[facility]  OR;  Service: Open Heart Surgery;  Laterality: N/A;   LEFT HEART CATHETERIZATION WITH CORONARY ANGIOGRAM N/A 02/26/2013   Procedure: LEFT HEART CATHETERIZATION WITH CORONARY ANGIOGRAM;  Surgeon: Marykay Lex, MD;  Location: [facility] CATH LAB;  Service: Cardiovascular;  Laterality: N/A;   MAZE N/A 02/28/2013   Procedure: MAZE;  Surgeon: Purcell Nails, MD;  Location: [facility] OR;  Service: Open Heart Surgery;  Laterality: N/A;   MITRAL VALVE REPAIR N/A 02/28/2013   Procedure: MITRAL VALVE REPAIR (MVR);  Surgeon: Purcell Nails, MD;  Location: [facility] OR;  Service: Open Heart Surgery;  Laterality: N/A;   PROSTATE BIOPSY     TRANSURETHRAL RESECTION OF PROSTATE N/A 02/04/2017   Procedure: TRANSURETHRAL RESECTION OF THE PROSTATE (TURP);  Surgeon: Sebastian Ache, MD;  Location: [facility] ORS;  Service: Urology;  Laterality: N/A;    Allergies  Allergen Reactions   Other Other (See Comments)    ANESTHESIA.... 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 ANESTHESIA.... 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 Occurred post op CABG 2015, required readmission and rehab    Outpatient Encounter Medications as of 10/06/2022  Medication Sig   acetaminophen (TYLENOL) 500 MG tablet Take 2 tablets (1,000 mg total) by mouth 2 (two) times daily as needed.   atorvastatin (LIPITOR) 10 MG tablet TAKE 1 TABLET EVERY DAY AT 6PM   Cholecalciferol (VITAMIN D3 PO) Take 2,000 Units by mouth daily.   Cyanocobalamin 2500 MCG SUBL Place 1 tablet under the tongue daily.   divalproex (DEPAKOTE) 125 MG DR tablet Take 250 mg by mouth at bedtime.   escitalopram (LEXAPRO) 5 MG tablet Take 5 mg by mouth daily.   ferrous sulfate 325 (65 FE) MG EC tablet TAKE 1 TABLET BY MOUTH EVERY DAY   fish oil-omega-3 fatty acids 1000 MG capsule Take 1 g by mouth every morning.   levothyroxine (SYNTHROID) 50 MCG tablet TAKE 1 TABLET BY MOUTH IN THE MORNING   Loperamide-Simethicone (IMODIUM ADVANCED PO) Take 4 mg by mouth as needed.    METOPROLOL TARTRATE PO Take 12.5 mg by mouth in the morning and at bedtime.   nitroGLYCERIN (NITROSTAT) 0.4 MG SL tablet Place 1 tablet (0.4 mg total) under the tongue every 5 (five) minutes as needed for chest pain.   QUEtiapine (SEROQUEL) 25 MG tablet Take 1 tablet (25 mg total) by mouth daily in the afternoon. Give at 2 pm   simethicone (MYLICON) 80 MG chewable tablet Chew 80 mg by mouth every 12 (twelve) hours as needed for flatulence.   zinc oxide 20 % ointment Apply 1 Application topically as needed for irritation.   No facility-administered encounter medications on file as of 10/06/2022.    Review of Systems  Unable to obtain due to dementia.   Immunization History  Administered Date(s) Administered   Fluad Quad(high Dose 65+) 11/21/2018, 12/25/2019, 01/02/2021, 12/16/2021   Influenza Split 12/15/2006, 12/06/2007, 12/31/2009, 11/01/2011   Influenza Whole 12/15/2006, 12/06/2007, 12/31/2009   Influenza, High Dose Seasonal PF 11/11/2014, 11/12/2015, 11/15/2016, 11/18/2017, 11/21/2018, 01/02/2021   Influenza,inj,Quad  PF,6+ Mos 11/01/2012, 11/09/2013   Influenza-Unspecified 11/01/2011, 11/01/2012, 11/09/2013   Moderna SARS-COV2 Booster Vaccination 01/07/2020   Moderna Sars-Covid-2 Vaccination 02/26/2019, 03/26/2019   PFIZER(Purple Top)SARS-COV-2 Vaccination 12/29/2021   Pneumococcal Conjugate-13 11/09/2013   Pneumococcal Polysaccharide-23 02/23/2004   Td 02/22/2002   Td (Adult),5 Lf Tetanus Toxid, Preservative Free 02/22/2002   Tdap 01/22/2014   Pertinent  Health Maintenance Due  Topic Date Due   INFLUENZA VACCINE  09/23/2022   Colonoscopy  Discontinued      12/18/2021   10:15 AM 01/25/2022    1:55 PM 02/05/2022    9:54 AM 05/31/2022    3:57 PM 08/11/2022    2:32 PM  Fall Risk  Falls in the past year? 1 1 1 1  0  Was there an injury with Fall? 0 0 0 0 0  Fall Risk Category Calculator 2 2 2 2  0  Fall Risk Category (Retired) Moderate Moderate Moderate    (RETIRED) Patient Fall  Risk Level Moderate fall risk Moderate fall risk Moderate fall risk    Patient at Risk for Falls Due to History of fall(s);Impaired balance/gait;Impaired mobility History of fall(s);Impaired balance/gait;Impaired mobility History of fall(s);Impaired balance/gait;Impaired mobility History of fall(s);Impaired balance/gait;Impaired mobility;Mental status change History of fall(s);Impaired balance/gait;Impaired mobility;Mental status change  Fall risk Follow up Falls evaluation completed;Education provided;Falls prevention discussed Falls evaluation completed Falls evaluation completed Falls evaluation completed;Education provided;Falls prevention discussed Falls evaluation completed;Education provided;Falls prevention discussed     Vitals:   10/06/22 1720  BP: 112/62  Pulse: 67  Resp: 16  Temp: (!) 97.4 F (36.3 C)  SpO2: 99%  Weight: 147 lb 12.8 oz (67 kg)  Height: 5\' 9"  (1.753 m)   Body mass index is 21.83 kg/m.  Physical Exam Constitutional:      General: He is not in acute distress.    Appearance: Normal appearance.  HENT:     Head: Normocephalic and atraumatic.     Mouth/Throat:     Mouth: Mucous membranes are moist.  Eyes:     Conjunctiva/sclera: Conjunctivae normal.  Cardiovascular:     Rate and Rhythm: Normal rate and regular rhythm.     Pulses: Normal pulses.     Heart sounds: Normal heart sounds.  Pulmonary:     Effort: Pulmonary effort is normal.     Breath sounds: Normal breath sounds.  Abdominal:     General: Bowel sounds are normal.     Palpations: Abdomen is soft.  Musculoskeletal:        General: No swelling. Normal range of motion.     Cervical back: Normal range of motion.  Skin:    General: Skin is warm and dry.  Neurological:     Mental Status: He is alert. Mental status is at baseline.  Psychiatric:        Mood and Affect: Mood normal.        Behavior: Behavior normal.      Labs reviewed: Recent Labs    02/18/22 0000 07/26/22 0000  09/16/22 0000  NA 136* 137 138  K 4.6 4.0 4.0  CL 104 106 104  CO2 24* 26* 29*  BUN 23* 16 20  CREATININE 1.0 1.0 1.1  CALCIUM 8.5* 8.7 8.5*   Recent Labs    12/11/21 0000 07/26/22 0000 09/16/22 0000  AST 17 14 12*  ALT 16 9* 10  ALKPHOS 68 55 52  ALBUMIN 3.4* 3.1* 2.9*   Recent Labs    12/11/21 0000 02/18/22 0000 07/26/22 0000  WBC 5.1 6.9 4.3  NEUTROABS 3,738.00  --  2,877.00  HGB 12.1* 10.7* 11.6*  HCT 36* 32* 35*  PLT 239 182 145*   Lab Results  Component Value Date   TSH 3.01 07/26/2022   Lab Results  Component Value Date   HGBA1C 5.6 07/01/2016   Lab Results  Component Value Date   CHOL 122 12/11/2021   HDL 51 12/11/2021   LDLCALC 55 12/11/2021   LDLDIRECT 159.3 05/17/2007   TRIG 75 12/11/2021   CHOLHDL 2 01/28/2021    Significant Diagnostic Results in last 30 days:  No results found.  Assessment/Plan  1. Moderate vascular dementia with psychotic disturbance (HCC) -  continue supportive care  2. Acquired hypothyroidism Lab Results  Component Value Date   TSH 3.01 07/26/2022    -  continue Levothyroxine 3. Iron deficiency anemia, unspecified iron deficiency anemia type Lab Results  Component Value Date   HGB 11.6 (A) 07/26/2022    -  continue FeSO4  4. Essential hypertension -  BP 112/62, stable -   continue Metoprolol tartrate  5.  ADENOCARCINOMA, PROSTATE, GLEASON GRADE 3 -  continue Lupron at Urology     Family/ staff Communication: Discussed plan of care with charge nurse.  Labs/tests ordered:  None    Kenard Gower, DNP, MSN, FNP-BC [facility] [phone] (Monday-Friday 8:00 a.m. - 5:00 p.m.) [phone] (after hours)

## 2022-10-11 ENCOUNTER — Non-Acute Institutional Stay (SKILLED_NURSING_FACILITY): Payer: Medicare HMO | Admitting: Sports Medicine

## 2022-10-11 ENCOUNTER — Encounter: Payer: Self-pay | Admitting: Sports Medicine

## 2022-10-11 DIAGNOSIS — E039 Hypothyroidism, unspecified: Secondary | ICD-10-CM

## 2022-10-11 DIAGNOSIS — F01B2 Vascular dementia, moderate, with psychotic disturbance: Secondary | ICD-10-CM

## 2022-10-11 DIAGNOSIS — C61 Malignant neoplasm of prostate: Secondary | ICD-10-CM | POA: Diagnosis not present

## 2022-10-11 DIAGNOSIS — D509 Iron deficiency anemia, unspecified: Secondary | ICD-10-CM | POA: Diagnosis not present

## 2022-10-11 NOTE — Progress Notes (Signed)
Provider Venita Sheffield Location:   [facility]    Place of Service:   [facility]  PCP: Octavia Heir, NP Patient Care Team: Octavia Heir, NP as PCP - General (Adult Health Nurse Practitioner) Verner Chol, [facility] (Inactive) as Pharmacist (Pharmacist) Mahlon Gammon, MD as Consulting Physician (Internal Medicine)  Extended Emergency Contact Information Primary Emergency Contact: Jessop,Geraldine Address: [address] [place]          [place] [address] [place] Home Phone: [phone] Relation: Spouse Secondary Emergency Contact: Colvin Caroli Mobile Phone: [phone] Relation: Daughter  Code Status:  Goals of Care: Advanced Directive information    10/04/2022   12:22 PM  Advanced Directives  Does Patient Have a Medical Advance Directive? Yes  Type of Estate agent of [place];Out of facility DNR (pink MOST or yellow form)  Does patient want to make changes to medical advance directive? No - Patient declined  Copy of Healthcare Power of Attorney in Chart? Yes - validated most recent copy scanned in chart (See row information)      No chief complaint on file.   HPI: Patient is a 89 y.o. male  with PMH of vascular dementia with behavioral issues, prostate cancer s/p TURP, hypothyroidism, IDA seen today for admission to SNF . He is moving from [facility] to [place] due to insurance purposes. Pt seen and examined in his room, he is wheel chair bound and able to wheel himself. He has underlying dementia and is oriented to time and place and seems confused at times. No behavioral issues as per nursing staff. Pt reports that he is adjusting well here. Denies cough, sob, abdominal pain, nausea, vomiting, dysuria, hematuria, bloody or dark stools. Spoke with his wife who also moved here , she hasn't noticed any change in her mental status.   Past Medical History:  Diagnosis Date    Complication of anesthesia    "serious cognisance problems since my OR in January" (03/13/2013   Coronary artery disease    Diverticulosis    Hiatal hernia    Hx of echocardiogram    Echo 5/16:  Mild focal basal septal hypertrophy, EF 55%, mild AI, MV repair ok with trivial MR (mean 3 mmHg), mild LAE, mild RAE, PASP 35 mmHg   Hypertension    Hypothyroidism    Irritable bowel syndrome    PAF (paroxysmal atrial fibrillation) (HCC) 05/03/2014   Personal history of colonic polyps    tubular adenoma   Pleural effusion, bilateral 03/12/2013   Small to moderate, L>R   Prostate cancer [facility]) 1991   sees Dr. Gala Lewandowsky at [facility], observation only    S/P CABG x 2 02/28/2013   LIMA to LAD, SVG to OM1, EVH via right thigh   S/P Maze operation for atrial fibrillation 02/28/2013   Complete bilateral atrial lesion set using bipolar radiofrequency and cryothermy ablation with clipping of LA appendage   S/P mitral valve repair, maze procedure, and CABG x2 02/28/2013   Complex valvuloplasty including triangular resection of posterior leaflet, 30 mm Sorin Memo 3D ring annuloplasty   Past Surgical History:  Procedure Laterality Date   CARDIAC CATHETERIZATION  2015   CATARACT EXTRACTION, BILATERAL Bilateral 2014   per Dr. Nile Riggs    COLONOSCOPY  2011   per Dr. Jarold Motto, benign polyps, no repeats needed    CORONARY ARTERY BYPASS GRAFT N/A 02/28/2013   Procedure: CORONARY ARTERY BYPASS GRAFTING (CABG);  Surgeon: Salvatore Decent  Cornelius Moras, MD;  Location: [facility] OR;  Service: Open Heart Surgery;  Laterality: N/A;  CABG x 2,  using left internal mammary artery and right leg saphenous vein harvested endoscopically   ESOPHAGOGASTRODUODENOSCOPY (EGD) WITH PROPOFOL N/A 01/11/2019   Procedure: ESOPHAGOGASTRODUODENOSCOPY (EGD) WITH PROPOFOL;  Surgeon: Sherrilyn Rist, MD;  Location: [facility] ENDOSCOPY;  Service: Gastroenterology;  Laterality: N/A;   HEMORRHOID SURGERY     INTRAOPERATIVE TRANSESOPHAGEAL ECHOCARDIOGRAM N/A 02/28/2013    Procedure: INTRAOPERATIVE TRANSESOPHAGEAL ECHOCARDIOGRAM;  Surgeon: Purcell Nails, MD;  Location: [facility] OR;  Service: Open Heart Surgery;  Laterality: N/A;   LEFT HEART CATHETERIZATION WITH CORONARY ANGIOGRAM N/A 02/26/2013   Procedure: LEFT HEART CATHETERIZATION WITH CORONARY ANGIOGRAM;  Surgeon: Marykay Lex, MD;  Location: [facility] CATH LAB;  Service: Cardiovascular;  Laterality: N/A;   MAZE N/A 02/28/2013   Procedure: MAZE;  Surgeon: Purcell Nails, MD;  Location: [facility] OR;  Service: Open Heart Surgery;  Laterality: N/A;   MITRAL VALVE REPAIR N/A 02/28/2013   Procedure: MITRAL VALVE REPAIR (MVR);  Surgeon: Purcell Nails, MD;  Location: [facility] OR;  Service: Open Heart Surgery;  Laterality: N/A;   PROSTATE BIOPSY     TRANSURETHRAL RESECTION OF PROSTATE N/A 02/04/2017   Procedure: TRANSURETHRAL RESECTION OF THE PROSTATE (TURP);  Surgeon: Sebastian Ache, MD;  Location: [facility] ORS;  Service: Urology;  Laterality: N/A;    reports that he has never smoked. He has never used smokeless tobacco. He reports that he does not drink alcohol and does not use drugs. Social History   Socioeconomic History   Marital status: Married    Spouse name: Alvira Philips   Number of children: 3   Years of education: Not on file   Highest education level: Not on file  Occupational History   Occupation: Retired  Tobacco Use   Smoking status: Never   Smokeless tobacco: Never  Vaping Use   Vaping status: Never Used  Substance and Sexual Activity   Alcohol use: No    Alcohol/week: 0.0 standard drinks of alcohol   Drug use: No   Sexual activity: Not Currently  Other Topics Concern   Not on file  Social History Narrative   Married retired Agricultural consultant   Lives at [employer], independent living though his wife is in a wheelchair and they help each other   1 son lives in [place] a daughter lives in [place]   Never smoker no alcohol or drugs   Social Determinants of Health   Financial Resource  Strain: Low Risk  (12/18/2021)   Overall Financial Resource Strain (CARDIA)    Difficulty of Paying Living Expenses: Not hard at all  Food Insecurity: No Food Insecurity (12/18/2021)   Hunger Vital Sign    Worried About Running Out of Food in the Last Year: Never true    Ran Out of Food in the Last Year: Never true  Transportation Needs: No Transportation Needs (11/04/2020)   PRAPARE - Administrator, Civil Service (Medical): No    Lack of Transportation (Non-Medical): No  Physical Activity: Inactive (12/18/2021)   Exercise Vital Sign    Days of Exercise per Week: 0 days    Minutes of Exercise per Session: 0 min  Stress: No Stress Concern Present (12/18/2021)   [employer] of Occupational Health - Occupational Stress Questionnaire    Feeling of Stress : Not at all  Social Connections: Moderately Integrated (12/18/2021)   Social Connection and Isolation Panel [NHANES]  Frequency of Communication with Friends and Family: More than three times a week    Frequency of Social Gatherings with Friends and Family: More than three times a week    Attends Religious Services: More than 4 times per year    Active Member of [employer] or Organizations: No    Attends Banker Meetings: Never    Marital Status: Married  Catering manager Violence: Not At Risk (12/18/2021)   Humiliation, Afraid, Rape, and Kick questionnaire    Fear of Current or Ex-Partner: No    Emotionally Abused: No    Physically Abused: No    Sexually Abused: No    Functional Status Survey:    Family History  Problem Relation Age of Onset   Hypertension Mother    Sudden death Mother    Stroke Mother    Heart disease Mother    Hypertension Father    Sudden death Father    Stroke Father    Heart disease Father    Heart disease Son    Diabetes Son    Lung cancer Brother    Stomach cancer Brother    Cancer Daughter        rebdomyosarcoma   Kidney cancer Brother        mets   Diabetes  Sister     Health Maintenance  Topic Date Due   Zoster Vaccines- Shingrix (1 of 2) Never done   COVID-19 Vaccine (4 - 2023-24 season) 02/23/2022   INFLUENZA VACCINE  09/23/2022   Medicare Annual Wellness (AWV)  12/19/2022   DTaP/Tdap/Td (4 - Td or Tdap) 01/23/2024   Pneumonia Vaccine 109+ Years old  Completed   HPV VACCINES  Aged Out   Colonoscopy  Discontinued    Allergies  Allergen Reactions   Other Other (See Comments)    ANESTHESIA.... 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 ANESTHESIA.... 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 Occurred post op CABG 2015, required readmission and rehab    Outpatient Encounter Medications as of 10/11/2022  Medication Sig   acetaminophen (TYLENOL) 500 MG tablet Take 2 tablets (1,000 mg total) by mouth 2 (two) times daily as needed.   atorvastatin (LIPITOR) 10 MG tablet TAKE 1 TABLET EVERY DAY AT 6PM   Cholecalciferol (VITAMIN D3 PO) Take 2,000 Units by mouth daily.   Cyanocobalamin 2500 MCG SUBL Place 1 tablet under the tongue daily.   divalproex (DEPAKOTE) 125 MG DR tablet Take 250 mg by mouth at bedtime.   escitalopram (LEXAPRO) 5 MG tablet Take 5 mg by mouth daily.   ferrous sulfate 325 (65 FE) MG EC tablet TAKE 1 TABLET BY MOUTH EVERY DAY   fish oil-omega-3 fatty acids 1000 MG capsule Take 1 g by mouth every morning.   levothyroxine (SYNTHROID) 50 MCG tablet TAKE 1 TABLET BY MOUTH IN THE MORNING   Loperamide-Simethicone (IMODIUM ADVANCED PO) Take 4 mg by mouth as needed.   METOPROLOL TARTRATE PO Take 12.5 mg by mouth in the morning and at bedtime.   nitroGLYCERIN (NITROSTAT) 0.4 MG SL tablet Place 1 tablet (0.4 mg total) under the tongue every 5 (five) minutes as needed for chest pain.   QUEtiapine (SEROQUEL) 25 MG tablet Take 1 tablet (25 mg total) by mouth daily in the afternoon. Give at 2 pm   simethicone (MYLICON) 80 MG chewable tablet Chew 80 mg by mouth every 12 (twelve) hours as needed for  flatulence.   zinc oxide 20 % ointment Apply 1 Application  topically as needed for irritation.   No facility-administered encounter medications on file as of 10/11/2022.    Review of Systems  Unable to perform ROS: Dementia  Constitutional:  Negative for activity change.  HENT:  Negative for congestion and postnasal drip.   Respiratory:  Negative for cough and shortness of breath.   Cardiovascular:  Negative for chest pain and leg swelling.  Gastrointestinal:  Negative for abdominal pain, constipation and nausea.  Genitourinary:  Negative for dysuria and hematuria.  Musculoskeletal:  Negative for myalgias.  Neurological:  Negative for dizziness.    There were no vitals filed for this visit. There is no height or weight on file to calculate BMI. Physical Exam Constitutional:      Appearance: Normal appearance.  HENT:     Head: Normocephalic and atraumatic.  Cardiovascular:     Rate and Rhythm: Normal rate and regular rhythm.     Pulses: Normal pulses.     Heart sounds: Normal heart sounds.  Pulmonary:     Effort: No respiratory distress.     Breath sounds: No wheezing.  Abdominal:     General: There is no distension.     Palpations: Abdomen is soft.     Tenderness: There is no abdominal tenderness. There is no guarding.  Musculoskeletal:        General: No swelling.  Skin:    General: Skin is warm.  Neurological:     Mental Status: He is alert. Mental status is at baseline.     Labs reviewed: Basic Metabolic Panel: Recent Labs    02/18/22 0000 07/26/22 0000 09/16/22 0000  NA 136* 137 138  K 4.6 4.0 4.0  CL 104 106 104  CO2 24* 26* 29*  BUN 23* 16 20  CREATININE 1.0 1.0 1.1  CALCIUM 8.5* 8.7 8.5*   Liver Function Tests: Recent Labs    12/11/21 0000 07/26/22 0000 09/16/22 0000  AST 17 14 12*  ALT 16 9* 10  ALKPHOS 68 55 52  ALBUMIN 3.4* 3.1* 2.9*   No results for input(s): "LIPASE", "AMYLASE" in the last 8760 hours. No results for input(s):  "AMMONIA" in the last 8760 hours. CBC: Recent Labs    12/11/21 0000 02/18/22 0000 07/26/22 0000  WBC 5.1 6.9 4.3  NEUTROABS 3,738.00  --  2,877.00  HGB 12.1* 10.7* 11.6*  HCT 36* 32* 35*  PLT 239 182 145*   Cardiac Enzymes: No results for input(s): "CKTOTAL", "CKMB", "CKMBINDEX", "TROPONINI" in the last 8760 hours. BNP: Invalid input(s): "POCBNP" Lab Results  Component Value Date   HGBA1C 5.6 07/01/2016   Lab Results  Component Value Date   TSH 3.01 07/26/2022   Lab Results  Component Value Date   VITAMINB12 548 01/03/2020   Lab Results  Component Value Date   FOLATE 41.2 01/10/2019   Lab Results  Component Value Date   IRON 74 05/23/2019   FERRITIN 32.1 05/23/2019    Imaging and Procedures obtained prior to SNF admission: CT ABDOMEN PELVIS WO CONTRAST  Result Date: 07/29/2021 CLINICAL DATA:  Prostate cancer, assess treatment response. * Tracking Code: BO * EXAM: CT ABDOMEN AND PELVIS WITHOUT CONTRAST TECHNIQUE: Multidetector CT imaging of the abdomen and pelvis was performed following the standard protocol without IV contrast. RADIATION DOSE REDUCTION: This exam was performed according to the departmental dose-optimization program which includes automated exposure control, adjustment of the mA and/or kV according to patient size and/or use of iterative reconstruction technique. COMPARISON:  Multiple priors including most recent CT  abdomen pelvis Jul 07, 2020 FINDINGS: Lower chest: No acute abnormality. Large hiatal hernia/intrathoracic stomach with associated compressive atelectasis in the paramedian lower lobes. Mitral annular calcifications with mitral valve prosthesis. Prior median sternotomy and CABG. Hepatobiliary: Scattered calcified hepatic granulomata. No suspicious hepatic lesion on this noncontrast examination. Cholelithiasis with calcified gallbladder wall (porcelain gallbladder). No biliary ductal dilation. Pancreas: No pancreatic ductal dilation or evidence of  acute inflammation. Spleen: No splenomegaly or focal splenic lesion. Adrenals/Urinary Tract: Bilateral adrenal glands appear normal. Atrophic left kidney with unchanged position of the left double-J ureteral stent with pigtails in the left renal pelvis and urinary bladder. Similar to minimally increased soft tissue stranding about the left renal pelvis and proximal left ureter. Right kidney is unremarkable without hydronephrosis. Urinary bladder is decompressed. Stomach/Bowel: Radiopaque enteric contrast material traverses distal loops of small bowel. Large hiatal hernia/intrathoracic stomach. No pathologic dilation of small or large bowel. Sigmoid colonic diverticulosis without findings of acute diverticulitis. Surgical changes of rectal resection with reanastomosis similar prior. Vascular/Lymphatic: Extensive aortic and branch vessel atherosclerosis without abdominal aortic aneurysm. No pathologically enlarged abdominal or pelvic lymph nodes. Reproductive: Prior prostatectomy. Similar soft tissue thickening about the distal left ureter left mesorectal fascia measuring approximally 4.6 x 1.5 cm, unchanged. Other: Trace pelvic free fluid is similar prior. No walled off fluid collections. Musculoskeletal: Similar L5 superior endplate compression deformity. No aggressive lytic or blastic lesion of bone. IMPRESSION: 1. Post prostatectomy with similar soft tissue thickening about the distal left ureter and left mesorectal fascia/presacral space. 2. Unchanged position of the left double-J ureteral stent with similar to minimally increased stranding about the left renal pelvis and proximal ureter. No discrete hydronephrosis. 3. No convincing evidence of new or progressive disease in the abdomen or pelvis on this noncontrast CT. 4. Large hiatal hernia/intrathoracic stomach appears similar to multiple priors. 5. Cholelithiasis in a porcelain gallbladder. 6. Colonic diverticulosis without findings of acute diverticulitis. 7.   Aortic Atherosclerosis (ICD10-I70.0). Electronically Signed   By: Maudry Mayhew M.D.   On: 07/29/2021 14:42    Assessment/Plan  Moderate Vascular dementia with behavioral issues  No behavioral changes, adjusting well  On seroquel, depakote , lexapro ,ativan prn  HTN  Bp 112/ 56  Denies being dizzy or lightheaded On metoprolol 12.5mg  bid  Prostate Adenocarcinoma  with  grade 3 Gleason score follows with Dr. Merry Lofty,  S/p TURP 2019,left urethral obstruction  s/p stent exchange on  07/24 ,  On lupron injection every 3 months No hematuria  Cr 1.08 7/24   IDA  Hb 12.2 7/24  Hypothyroidism Cont with synthyroid       Dr [facility] [address] [place], [place] [address] Office [phone]    I spent greater than 35 minutes for the care of this patient in face to face time, chart review, clinical documentation, patient education.

## 2022-11-09 ENCOUNTER — Encounter: Payer: Self-pay | Admitting: Nurse Practitioner

## 2022-11-09 ENCOUNTER — Non-Acute Institutional Stay (SKILLED_NURSING_FACILITY): Payer: Self-pay | Admitting: Nurse Practitioner

## 2022-11-09 DIAGNOSIS — Z66 Do not resuscitate: Secondary | ICD-10-CM

## 2022-11-09 DIAGNOSIS — D6489 Other specified anemias: Secondary | ICD-10-CM

## 2022-11-09 DIAGNOSIS — E538 Deficiency of other specified B group vitamins: Secondary | ICD-10-CM | POA: Insufficient documentation

## 2022-11-09 DIAGNOSIS — F03918 Unspecified dementia, unspecified severity, with other behavioral disturbance: Secondary | ICD-10-CM | POA: Diagnosis not present

## 2022-11-09 DIAGNOSIS — C61 Malignant neoplasm of prostate: Secondary | ICD-10-CM

## 2022-11-09 DIAGNOSIS — I10 Essential (primary) hypertension: Secondary | ICD-10-CM

## 2022-11-09 DIAGNOSIS — F0393 Unspecified dementia, unspecified severity, with mood disturbance: Secondary | ICD-10-CM | POA: Diagnosis not present

## 2022-11-09 DIAGNOSIS — E785 Hyperlipidemia, unspecified: Secondary | ICD-10-CM | POA: Diagnosis not present

## 2022-11-09 DIAGNOSIS — E039 Hypothyroidism, unspecified: Secondary | ICD-10-CM | POA: Diagnosis not present

## 2022-11-09 LAB — TSH: TSH: 2.04 (ref 0.41–5.90)

## 2022-11-09 LAB — BASIC METABOLIC PANEL
BUN: 19 (ref 4–21)
CO2: 27 — AB (ref 13–22)
Chloride: 105 (ref 99–108)
Creatinine: 1 (ref 0.6–1.3)
Glucose: 81
Potassium: 4.2 meq/L (ref 3.5–5.1)
Sodium: 140 (ref 137–147)

## 2022-11-09 LAB — COMPREHENSIVE METABOLIC PANEL
Albumin: 3.2 — AB (ref 3.5–5.0)
Calcium: 8.6 — AB (ref 8.7–10.7)
Globulin: 2.5
eGFR: 72

## 2022-11-09 LAB — CBC AND DIFFERENTIAL
HCT: 36 — AB (ref 41–53)
Hemoglobin: 11.7 — AB (ref 13.5–17.5)
Platelets: 158 10*3/uL (ref 150–400)
WBC: 4.4

## 2022-11-09 LAB — HEPATIC FUNCTION PANEL
ALT: 18 U/L (ref 10–40)
AST: 19 (ref 14–40)
Alkaline Phosphatase: 58 (ref 25–125)
Bilirubin, Total: 0.6

## 2022-11-09 LAB — LIPID PANEL
Cholesterol: 103 (ref 0–200)
HDL: 46 (ref 35–70)
LDL Cholesterol: 39
LDl/HDL Ratio: 2.2
Triglycerides: 99 (ref 40–160)

## 2022-11-09 LAB — CBC: RBC: 3.75 — AB (ref 3.87–5.11)

## 2022-11-09 NOTE — Assessment & Plan Note (Signed)
Taking supplement

## 2022-11-09 NOTE — Assessment & Plan Note (Signed)
Hgb 11.7 11/09/22, On Fe

## 2022-11-09 NOTE — Assessment & Plan Note (Signed)
Blood pressure is controlled, on Metoprolol, Bun/creat 20/1.1 09/16/22

## 2022-11-09 NOTE — Assessment & Plan Note (Signed)
stable, no behavioral issues. His mood is stable, takes Depakote, Lexapro, Seroquel, prn Lorazepam.

## 2022-11-09 NOTE — Assessment & Plan Note (Signed)
s/p TURP 2019, L ureter obstructin s/p stent exchage 09/15/22, on Lupron q3 months,  s/p cystoscopy, followed by Urology

## 2022-11-09 NOTE — Progress Notes (Unsigned)
Location:  [facility] Room Number: N016-A Place of Service:  SNF (31) Provider:  Kamaryn Grimley X Amayrani Bennick  Octavia Heir, NP  Patient Care Team: Octavia Heir, NP as PCP - General (Adult Health Nurse Practitioner) Verner Chol, [facility] (Inactive) as Pharmacist (Pharmacist) Mahlon Gammon, MD as Consulting Physician (Internal Medicine)  Extended Emergency Contact Information Primary Emergency Contact: Grant,Geraldine Address: [address] [place]          [place] [address] [place] Home Phone: [phone] Relation: Spouse Secondary Emergency Contact: Colvin Caroli Mobile Phone: [phone] Relation: Daughter  Code Status:  DNR Goals of care: Advanced Directive information    11/09/2022    9:45 AM  Advanced Directives  Does Patient Have a Medical Advance Directive? Yes  Type of Estate agent of [place];Living will;Out of facility DNR (pink MOST or yellow form)  Does patient want to make changes to medical advance directive? Yes (Inpatient - patient defers changing a medical advance directive at this time - Information given)  Copy of Healthcare Power of Attorney in Chart? Yes - validated most recent copy scanned in chart (See row information)     Chief Complaint  Patient presents with   Medical Management of Chronic Issues    HPI:  Pt is a 89 y.o. male seen today for managing chronic medical conditions.   Dementia, stable, no behavioral issues.   Depression/anxiety, takes Depakote, Lexapro, Seroquel, prn Lorazepam.   HTN, on Metoprolol, Bun/creat 20/1.1 09/16/22  Hypothyroidism, taking Levothyroxine, TSH 3.01 07/26/22  Prostate cancer, s/p TURP 2019, L ureter obstructin s/p stent exchage 09/15/22, on Lupron q3 months,  s/p cystoscopy, followed by Urology  IDA Hgb 11.7 11/09/22, On Fe  Vit B12 deficiency, on supplement  HLD LDL 55 12/11/21, takes Atovastatin.    Past Medical History:  Diagnosis Date   Complication of  anesthesia    "serious cognisance problems since my OR in January" (03/13/2013   Coronary artery disease    Diverticulosis    Hiatal hernia    Hx of echocardiogram    Echo 5/16:  Mild focal basal septal hypertrophy, EF 55%, mild AI, MV repair ok with trivial MR (mean 3 mmHg), mild LAE, mild RAE, PASP 35 mmHg   Hypertension    Hypothyroidism    Irritable bowel syndrome    PAF (paroxysmal atrial fibrillation) (HCC) 05/03/2014   Personal history of colonic polyps    tubular adenoma   Pleural effusion, bilateral 03/12/2013   Small to moderate, L>R   Prostate cancer [facility]) 1991   sees Dr. Gala [facility]owsky at [facility], observation only    S/P CABG x 2 02/28/2013   LIMA to LAD, SVG to OM1, EVH via right thigh   S/P Maze operation for atrial fibrillation 02/28/2013   Complete bilateral atrial lesion set using bipolar radiofrequency and cryothermy ablation with clipping of LA appendage   S/P mitral valve repair, maze procedure, and CABG x2 02/28/2013   Complex valvuloplasty including triangular resection of posterior leaflet, 30 mm Sorin Memo 3D ring annuloplasty   Past Surgical History:  Procedure Laterality Date   CARDIAC CATHETERIZATION  2015   CATARACT EXTRACTION, BILATERAL Bilateral 2014   per Dr. Nile Riggs    COLONOSCOPY  2011   per Dr. Jarold Motto, benign polyps, no repeats needed    CORONARY ARTERY BYPASS GRAFT N/A 02/28/2013   Procedure: CORONARY ARTERY BYPASS GRAFTING (CABG);  Surgeon: Purcell Nails, MD;  Location: [facility] OR;  Service: Open Heart Surgery;  Laterality: N/A;  CABG x 2,  using left internal mammary artery and right leg saphenous vein harvested endoscopically   ESOPHAGOGASTRODUODENOSCOPY (EGD) WITH PROPOFOL N/A 01/11/2019   Procedure: ESOPHAGOGASTRODUODENOSCOPY (EGD) WITH PROPOFOL;  Surgeon: Sherrilyn Rist, MD;  Location: [facility] ENDOSCOPY;  Service: Gastroenterology;  Laterality: N/A;   HEMORRHOID SURGERY     INTRAOPERATIVE TRANSESOPHAGEAL ECHOCARDIOGRAM N/A 02/28/2013   Procedure:  INTRAOPERATIVE TRANSESOPHAGEAL ECHOCARDIOGRAM;  Surgeon: Purcell Nails, MD;  Location: [facility] OR;  Service: Open Heart Surgery;  Laterality: N/A;   LEFT HEART CATHETERIZATION WITH CORONARY ANGIOGRAM N/A 02/26/2013   Procedure: LEFT HEART CATHETERIZATION WITH CORONARY ANGIOGRAM;  Surgeon: Marykay Lex, MD;  Location: [facility] CATH LAB;  Service: Cardiovascular;  Laterality: N/A;   MAZE N/A 02/28/2013   Procedure: MAZE;  Surgeon: Purcell Nails, MD;  Location: [facility] OR;  Service: Open Heart Surgery;  Laterality: N/A;   MITRAL VALVE REPAIR N/A 02/28/2013   Procedure: MITRAL VALVE REPAIR (MVR);  Surgeon: Purcell Nails, MD;  Location[facility] OR;  Service: Open Heart Surgery;  Laterality: N/A;   PROSTATE BIOPSY     TRANSURETHRAL RESECTION OF PROSTATE N/A 02/04/2017   Procedure: TRANSURETHRAL RESECTION OF THE PROSTATE (TURP);  Surgeon: Sebastian Ache, MD;  Location: [facility] ORS;  Service: Urology;  Laterality: N/A;    Allergies  Allergen Reactions   Other Other (See Comments)    ANESTHESIA.... 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 ANESTHESIA.... 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 Occurred post op CABG 2015, required readmission and rehab    Outpatient Encounter Medications as of 11/09/2022  Medication Sig   acetaminophen (TYLENOL) 500 MG tablet Take 2 tablets (1,000 mg total) by mouth 2 (two) times daily as needed. (Patient taking differently: Take 1,000 mg by mouth 2 (two) times daily.)   atorvastatin (LIPITOR) 10 MG tablet TAKE 1 TABLET EVERY DAY AT 6PM   Cholecalciferol (VITAMIN D3 PO) Take 2,000 Units by mouth daily.   Cyanocobalamin 2500 MCG SUBL Place 1 tablet under the tongue daily.   divalproex (DEPAKOTE) 125 MG DR tablet Take 250 mg by mouth at bedtime.   escitalopram (LEXAPRO) 5 MG tablet Take 5 mg by mouth daily.   ferrous sulfate 325 (65 FE) MG EC tablet TAKE 1 TABLET BY MOUTH EVERY DAY   fish oil-omega-3 fatty acids 1000 MG capsule Take 1 g by mouth every  morning.   levothyroxine (SYNTHROID) 50 MCG tablet TAKE 1 TABLET BY MOUTH IN THE MORNING   LORazepam (ATIVAN) 0.5 MG tablet Take 0.5 mg by mouth every 12 (twelve) hours as needed for anxiety.   METOPROLOL TARTRATE PO Take 12.5 mg by mouth in the morning and at bedtime.   nitroGLYCERIN (NITROSTAT) 0.4 MG SL tablet Place 1 tablet (0.4 mg total) under the tongue every 5 (five) minutes as needed for chest pain.   QUEtiapine (SEROQUEL) 25 MG tablet Take 1 tablet (25 mg total) by mouth daily in the afternoon. Give at 2 pm   simethicone (MYLICON) 80 MG chewable tablet Chew 80 mg by mouth every 12 (twelve) hours as needed for flatulence.   [DISCONTINUED] Loperamide-Simethicone (IMODIUM ADVANCED PO) Take 4 mg by mouth as needed.   [DISCONTINUED] zinc oxide 20 % ointment Apply 1 Application topically as needed for irritation.   No facility-administered encounter medications on file as of 11/09/2022.    Review of Systems  Unable to perform ROS: Dementia    Immunization History  Administered Date(s) Administered   Fluad Quad(high  Dose 65+) 11/21/2018, 12/25/2019, 01/02/2021, 12/16/2021   Influenza Split 12/15/2006, 12/06/2007, 12/31/2009, 11/01/2011   Influenza Whole 12/15/2006, 12/06/2007, 12/31/2009   Influenza, High Dose Seasonal PF 11/11/2014, 11/12/2015, 11/15/2016, 11/18/2017, 11/21/2018, 01/02/2021   Influenza,inj,Quad PF,6+ Mos 11/01/2012, 11/09/2013   Influenza-Unspecified 11/01/2011, 11/01/2012, 11/09/2013   Moderna SARS-COV2 Booster Vaccination 01/07/2020   Moderna Sars-Covid-2 Vaccination 02/26/2019, 03/26/2019   PFIZER(Purple Top)SARS-COV-2 Vaccination 12/29/2021   Pneumococcal Conjugate-13 11/09/2013   Pneumococcal Polysaccharide-23 02/23/2004   Td 02/22/2002   Td (Adult),5 Lf Tetanus Toxid, Preservative Free 02/22/2002   Tdap 01/22/2014   Pertinent  Health Maintenance Due  Topic Date Due   INFLUENZA VACCINE  09/23/2022   Colonoscopy  Discontinued      12/18/2021   10:15 AM  01/25/2022    1:55 PM 02/05/2022    9:54 AM 05/31/2022    3:57 PM 08/11/2022    2:32 PM  Fall Risk  Falls in the past year? 1 1 1 1  0  Was there an injury with Fall? 0 0 0 0 0  Fall Risk Category Calculator 2 2 2 2  0  Fall Risk Category (Retired) Moderate Moderate Moderate    (RETIRED) Patient Fall Risk Level Moderate fall risk Moderate fall risk Moderate fall risk    Patient at Risk for Falls Due to History of fall(s);Impaired balance/gait;Impaired mobility History of fall(s);Impaired balance/gait;Impaired mobility History of fall(s);Impaired balance/gait;Impaired mobility History of fall(s);Impaired balance/gait;Impaired mobility;Mental status change History of fall(s);Impaired balance/gait;Impaired mobility;Mental status change  Fall risk Follow up Falls evaluation completed;Education provided;Falls prevention discussed Falls evaluation completed Falls evaluation completed Falls evaluation completed;Education provided;Falls prevention discussed Falls evaluation completed;Education provided;Falls prevention discussed   Functional Status Survey:    Vitals:   11/09/22 0933  BP: 129/64  Pulse: 68  Resp: 19  Temp: (!) 97.2 F (36.2 C)  SpO2: 97%  Weight: 144 lb 12.8 oz (65.7 kg)  Height: 5\' 9"  (1.753 m)   Body mass index is 21.38 kg/m. Physical Exam Vitals and nursing note reviewed.  Constitutional:      Appearance: Normal appearance.  HENT:     Head: Normocephalic and atraumatic.     Nose: Nose normal.     Mouth/Throat:     Mouth: Mucous membranes are moist.  Eyes:     Extraocular Movements: Extraocular movements intact.     Conjunctiva/sclera: Conjunctivae normal.     Pupils: Pupils are equal, round, and reactive to light.  Cardiovascular:     Rate and Rhythm: Normal rate and regular rhythm.     Heart sounds: No murmur heard. Pulmonary:     Effort: Pulmonary effort is normal.     Breath sounds: No rales.  Abdominal:     General: Bowel sounds are normal.     Palpations:  Abdomen is soft.     Tenderness: There is no abdominal tenderness.  Musculoskeletal:     Cervical back: Normal range of motion and neck supple.     Right lower leg: No edema.     Left lower leg: No edema.  Skin:    General: Skin is warm and dry.  Neurological:     General: No focal deficit present.     Mental Status: He is alert.     Motor: No weakness.     Gait: Gait abnormal.     Comments: Oriented to self.   Psychiatric:        Mood and Affect: Mood normal.     Labs reviewed: Recent Labs    02/18/22 0000 07/26/22  0000 09/16/22 0000  NA 136* 137 138  K 4.6 4.0 4.0  CL 104 106 104  CO2 24* 26* 29*  BUN 23* 16 20  CREATININE 1.0 1.0 1.1  CALCIUM 8.5* 8.7 8.5*   Recent Labs    12/11/21 0000 07/26/22 0000 09/16/22 0000  AST 17 14 12*  ALT 16 9* 10  ALKPHOS 68 55 52  ALBUMIN 3.4* 3.1* 2.9*   Recent Labs    12/11/21 0000 02/18/22 0000 07/26/22 0000  WBC 5.1 6.9 4.3  NEUTROABS 3,738.00  --  2,877.00  HGB 12.1* 10.7* 11.6*  HCT 36* 32* 35*  PLT 239 182 145*   Lab Results  Component Value Date   TSH 3.01 07/26/2022   Lab Results  Component Value Date   HGBA1C 5.6 07/01/2016   Lab Results  Component Value Date   CHOL 122 12/11/2021   HDL 51 12/11/2021   LDLCALC 55 12/11/2021   LDLDIRECT 159.3 05/17/2007   TRIG 75 12/11/2021   CHOLHDL 2 01/28/2021    Significant Diagnostic Results in last 30 days:  No results found.  Assessment/Plan Essential hypertension Blood pressure is controlled, on Metoprolol, Bun/creat 20/1.1 09/16/22  ADENOCARCINOMA, PROSTATE, GLEASON GRADE 3 s/p TURP 2019, L ureter obstructin s/p stent exchage 09/15/22, on Lupron q3 months,  s/p cystoscopy, followed by Urology  Anemia due to multiple mechanisms  Hgb 11.7 11/09/22, On Fe  Vitamin B12 deficiency Taking supplement.   Dementia, senile with depression, with behavioral disturbance (HCC) stable, no behavioral issues. His mood is stable, takes Depakote, Lexapro, Seroquel,  prn Lorazepam.       Family/ staff Communication: plan of care reviewed with the patient and charge nurse.   Labs/tests ordered:  none  Time spend 35 minutes.

## 2022-11-23 DIAGNOSIS — C61 Malignant neoplasm of prostate: Secondary | ICD-10-CM | POA: Diagnosis not present

## 2022-11-30 ENCOUNTER — Encounter: Payer: Self-pay | Admitting: Adult Health

## 2022-11-30 ENCOUNTER — Non-Acute Institutional Stay (SKILLED_NURSING_FACILITY): Payer: Self-pay | Admitting: Adult Health

## 2022-11-30 DIAGNOSIS — E039 Hypothyroidism, unspecified: Secondary | ICD-10-CM

## 2022-11-30 DIAGNOSIS — I10 Essential (primary) hypertension: Secondary | ICD-10-CM

## 2022-11-30 DIAGNOSIS — C61 Malignant neoplasm of prostate: Secondary | ICD-10-CM

## 2022-11-30 DIAGNOSIS — F01B2 Vascular dementia, moderate, with psychotic disturbance: Secondary | ICD-10-CM

## 2022-11-30 DIAGNOSIS — I2510 Atherosclerotic heart disease of native coronary artery without angina pectoris: Secondary | ICD-10-CM

## 2022-11-30 NOTE — Progress Notes (Signed)
Location:  [facility] Room Number: [id number] Place of Service:  SNF (31) Provider:  Kenard Gower, DNP, FNP-BC  Patient Care Team: Venita Sheffield, MD as PCP - General (Internal Medicine) Verner Chol, [facility] (Inactive) as Pharmacist (Pharmacist) Mahlon Gammon, MD as Consulting Physician (Internal Medicine)  Extended Emergency Contact Information Primary Emergency Contact: Pryce,Gerry Address: [address]          [place] [address]          [place], [place] [address] [place] of [place] Home Phone: [phone] Relation: Spouse Secondary Emergency Contact: Kitchen,Larry Address: [address]          [place], [place] [address] [place] of [place] Mobile Phone: [phone] Relation: Son  Code Status:   Full Code  Goals of care: Advanced Directive information    11/09/2022    9:45 AM  Advanced Directives  Does Patient Have a Medical Advance Directive? Yes  Type of Estate agent of [place];Living will;Out of facility DNR (pink MOST or yellow form)  Does patient want to make changes to medical advance directive? Yes (Inpatient - patient defers changing a medical advance directive at this time - Information given)  Copy of Healthcare Power of Attorney in Chart? Yes - validated most recent copy scanned in chart (See row information)     Chief Complaint  Patient presents with   Medical Management of Chronic Issues    Routine    HPI:  Pt is a 89 y.o. [name] seen today for medical management of chronic diseases. He is a resident of [facility].  Essential hypertension  -  BP 108/58, take Metoprolol tartrate  Acquired hypothyroidism -  tsh 3.01 (07/26/22), takes Levithyroxine  ADENOCARCINOMA, PROSTATE, GLEASON GRADE 3 -  S/P TURP 2019, stent exchange on 7/24, on Lupron injection Q 3 months, followed by Dr. Merry Lofty ([facility])  Coronary artery disease involving native coronary artery of native heart  without angina pectoris -  denies chest pai, takes NTG PRN and Atorvastatin, unable to tolerate ASA due to history of GI bleed  Moderate vascular dementia with psychotic disturbance (HCC) -  BIMS score 11, ranging as moderate cognitively impaired, reported to have agitations, takes Depakote, Ativan PRN and Seroquel   Past Medical History:  Diagnosis Date   Complication of anesthesia    "serious cognisance problems since my OR in January" (03/13/2013   Coronary artery disease    Diverticulosis    Hiatal hernia    Hx of echocardiogram    Echo 5/16:  Mild focal basal septal hypertrophy, EF 55%, mild AI, MV repair ok with trivial MR (mean 3 mmHg), mild LAE, mild RAE, PASP 35 mmHg   Hypertension    Hypothyroidism    Irritable bowel syndrome    PAF (paroxysmal atrial fibrillation) (HCC) 05/03/2014   Personal history of colonic polyps    tubular adenoma   Pleural effusion, bilateral 03/12/2013   Small to moderate, L>R   Prostate cancer [facility]) 1991   sees Dr. Gala Lewandowsky at [facility], observation only    S/P CABG x 2 02/28/2013   LIMA to LAD, SVG to OM1, EVH via right thigh   S/P Maze operation for atrial fibrillation 02/28/2013   Complete bilateral atrial lesion set using bipolar radiofrequency and cryothermy ablation with clipping of LA appendage   S/P mitral valve repair, maze procedure, and CABG x2 02/28/2013   Complex valvuloplasty including triangular resection of posterior leaflet, 30 mm Sorin Memo 3D  ring annuloplasty   Past Surgical History:  Procedure Laterality Date   CARDIAC CATHETERIZATION  2015   CATARACT EXTRACTION, BILATERAL Bilateral 2014   per Dr. Nile Riggs    COLONOSCOPY  2011   per Dr. Jarold Motto, benign polyps, no repeats needed    CORONARY ARTERY BYPASS GRAFT N/A 02/28/2013   Procedure: CORONARY ARTERY BYPASS GRAFTING (CABG);  Surgeon: Purcell Nails, MD;  Location: [facility] OR;  Service: Open Heart Surgery;  Laterality: N/A;  CABG x 2,  using left internal mammary artery and right leg  saphenous vein harvested endoscopically   ESOPHAGOGASTRODUODENOSCOPY (EGD) WITH PROPOFOL N/A 01/11/2019   Procedure: ESOPHAGOGASTRODUODENOSCOPY (EGD) WITH PROPOFOL;  Surgeon: Sherrilyn Rist, MD;  [facility]: [facility];  Service: Gastroenterology;  Laterality: N/A;   HEMORRHOID SURGERY     INTRAOPERATIVE TRANSESOPHAGEAL ECHOCARDIOGRAM N/A 02/28/2013   Procedure: INTRAOPERATIVE TRANSESOPHAGEAL ECHOCARDIOGRAM;  Surgeon: Purcell Nails, MD;  Location: [facility] OR;  Service: Open Heart Surgery;  Laterality: N/A;   LEFT HEART CATHETERIZATION WITH CORONARY ANGIOGRAM N/A 02/26/2013   Procedure: LEFT HEART CATHETERIZATION WITH CORONARY ANGIOGRAM;  Surgeon: Marykay Lex, MD;  Location: [facility] CATH LAB;  Service: Cardiovascular;  Laterality: N/A;   MAZE N/A 02/28/2013   Procedure: MAZE;  Surgeon: Purcell Nails, MD;  Location: [facility] OR;  Service: Open Heart Surgery;  Laterality: N/A;   MITRAL VALVE REPAIR N/A 02/28/2013   Procedure: MITRAL VALVE REPAIR (MVR);  Surgeon: Purcell Nails, MD;  Location: [facility] OR;  Service: Open Heart Surgery;  Laterality: N/A;   PROSTATE BIOPSY     TRANSURETHRAL RESECTION OF PROSTATE N/A 02/04/2017   Procedure: TRANSURETHRAL RESECTION OF THE PROSTATE (TURP);  Surgeon: Sebastian Ache, MD;  Location: [facility] ORS;  Service: Urology;  Laterality: N/A;    Allergies  Allergen Reactions   Other Other (See Comments)    ANESTHESIA.... 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 ANESTHESIA.... 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 Occurred post op CABG 2015, required readmission and rehab    Outpatient Encounter Medications as of 11/30/2022  Medication Sig   acetaminophen (TYLENOL) 500 MG tablet Take 2 tablets (1,000 mg total) by mouth 2 (two) times daily as needed. (Patient taking differently: Take 1,000 mg by mouth 2 (two) times daily.)   atorvastatin (LIPITOR) 10 MG tablet TAKE 1 TABLET EVERY DAY AT 6PM   Cholecalciferol (VITAMIN D3 PO) Take 2,000 Units by mouth  daily.   Cyanocobalamin 2500 MCG SUBL Place 1 tablet under the tongue daily.   divalproex (DEPAKOTE) 125 MG DR tablet Take 250 mg by mouth at bedtime.   escitalopram (LEXAPRO) 5 MG tablet Take 5 mg by mouth daily.   ferrous sulfate 325 (65 FE) MG EC tablet TAKE 1 TABLET BY MOUTH EVERY DAY   fish oil-omega-3 fatty acids 1000 MG capsule Take 1 g by mouth every morning.   levothyroxine (SYNTHROID) 50 MCG tablet TAKE 1 TABLET BY MOUTH IN THE MORNING   LORazepam (ATIVAN) 0.5 MG tablet Take 0.5 mg by mouth every 12 (twelve) hours as needed for anxiety.   METOPROLOL TARTRATE PO Take 12.5 mg by mouth in the morning and at bedtime.   nitroGLYCERIN (NITROSTAT) 0.4 MG SL tablet Place 1 tablet (0.4 mg total) under the tongue every 5 (five) minutes as needed for chest pain.   QUEtiapine (SEROQUEL) 25 MG tablet Take 1 tablet (25 mg total) by mouth daily in the afternoon. Give at 2 pm   simethicone (MYLICON) 80 MG chewable tablet Chew  80 mg by mouth every 12 (twelve) hours as needed for flatulence.   No facility-administered encounter medications on file as of 11/30/2022.    Review of Systems   Unable to obtain due to dementia.    Immunization History  Administered Date(s) Administered   Fluad Quad(high Dose 65+) 11/21/2018, 12/25/2019, 01/02/2021, 12/16/2021   Influenza Split 12/15/2006, 12/06/2007, 12/31/2009, 11/01/2011   Influenza Whole 12/15/2006, 12/06/2007, 12/31/2009   Influenza, High Dose Seasonal PF 11/11/2014, 11/12/2015, 11/15/2016, 11/18/2017, 11/21/2018, 01/02/2021   Influenza,inj,Quad PF,6+ Mos 11/01/2012, 11/09/2013   Influenza-Unspecified 11/01/2011, 11/01/2012, 11/09/2013   Moderna SARS-COV2 Booster Vaccination 01/07/2020   Moderna Sars-Covid-2 Vaccination 02/26/2019, 03/26/2019   PFIZER(Purple Top)SARS-COV-2 Vaccination 12/29/2021   Pneumococcal Conjugate-13 11/09/2013   Pneumococcal Polysaccharide-23 02/23/2004   Td 02/22/2002   Td (Adult),5 Lf Tetanus Toxid, Preservative Free  02/22/2002   Tdap 01/22/2014   Pertinent  Health Maintenance Due  Topic Date Due   INFLUENZA VACCINE  09/23/2022   Colonoscopy  Discontinued      12/18/2021   10:15 AM 01/25/2022    1:55 PM 02/05/2022    9:54 AM 05/31/2022    3:57 PM 08/11/2022    2:Luis PM  Fall Risk  Falls in the past year? 1 1 1 1  0  Was there an injury with Fall? 0 0 0 0 0  Fall Risk Category Calculator 2 2 2 2  0  Fall Risk Category (Retired) Moderate Moderate Moderate    (RETIRED) Patient Fall Risk Level Moderate fall risk Moderate fall risk Moderate fall risk    Patient at Risk for Falls Due to History of fall(s);Impaired balance/gait;Impaired mobility History of fall(s);Impaired balance/gait;Impaired mobility History of fall(s);Impaired balance/gait;Impaired mobility History of fall(s);Impaired balance/gait;Impaired mobility;Mental status change History of fall(s);Impaired balance/gait;Impaired mobility;Mental status change  Fall risk Follow up Falls evaluation completed;Education provided;Falls prevention discussed Falls evaluation completed Falls evaluation completed Falls evaluation completed;Education provided;Falls prevention discussed Falls evaluation completed;Education provided;Falls prevention discussed     Vitals:   11/30/22 1400  BP: (!) 108/58  Pulse: 79  Resp: 19  Temp: (!) 97.2 F (36.2 C)  SpO2: 97%  Weight: 145 lb 4.8 oz (65.9 kg)  Height: 5\' 9"  (1.753 m)   Body mass index is 21.46 kg/m.  Physical Exam Constitutional:      General: He is not in acute distress.    Appearance: Normal appearance.  HENT:     Head: Normocephalic and atraumatic.     Mouth/Throat:     Mouth: Mucous membranes are moist.  Eyes:     Conjunctiva/sclera: Conjunctivae normal.  Cardiovascular:     Rate and Rhythm: Normal rate and regular rhythm.     Pulses: Normal pulses.     Heart sounds: Normal heart sounds.  Pulmonary:     Effort: Pulmonary effort is normal.     Breath sounds: Normal breath sounds.   Abdominal:     General: Bowel sounds are normal.     Palpations: Abdomen is soft.  Musculoskeletal:        General: No swelling. Normal range of motion.     Cervical back: Normal range of motion.  Skin:    General: Skin is warm and dry.  Neurological:     Mental Status: He is alert. He is disoriented.  Psychiatric:        Mood and Affect: Mood normal.        Behavior: Behavior normal.      Labs reviewed: Recent Labs    02/18/22 0000 07/26/22 0000 09/16/22 0000  NA 136* 137 138  K 4.6 4.0 4.0  CL 104 106 104  CO2 24* 26* 29*  BUN 23* 16 20  CREATININE 1.0 1.0 1.1  CALCIUM 8.5* 8.7 8.5*   Recent Labs    12/11/21 0000 07/26/22 0000 09/16/22 0000  AST 17 14 12*  ALT 16 9* 10  ALKPHOS 68 55 52  ALBUMIN 3.4* 3.1* 2.9*   Recent Labs    12/11/21 0000 02/18/22 0000 07/26/22 0000  WBC 5.1 6.9 4.3  NEUTROABS 3,738.00  --  2,877.00  HGB 12.1* 10.7* 11.6*  HCT 36* Luis* 35*  PLT 239 182 145*   Lab Results  Component Value Date   TSH 3.01 07/26/2022   Lab Results  Component Value Date   HGBA1C 5.6 07/01/2016   Lab Results  Component Value Date   CHOL 122 12/11/2021   HDL 51 12/11/2021   LDLCALC 55 12/11/2021   LDLDIRECT 159.3 05/17/2007   TRIG 75 12/11/2021   CHOLHDL 2 01/28/2021    Significant Diagnostic Results in last 30 days:  No results found.  Assessment/Plan  1. Essential hypertension -  BP stable -  continue Metoprolol tartrate  2. Acquired hypothyroidism Lab Results  Component Value Date   TSH 3.01 07/26/2022    -  continue Levothyroxine  3. ADENOCARCINOMA, PROSTATE, GLEASON GRADE 3 -  follow up with urology  4. Coronary artery disease involving native coronary artery of native heart without angina pectoris -  no chest pains, stable -  continue NTG PRN and Atorvastatin  5. Moderate vascular dementia with psychotic disturbance (HCC) -  has moderate cognitive impairment -  reported to have agitations -  continue Seroquel,  Depakote and Ativan PRN -  monitor behavior -  continue supportive care    Family/ staff Communication: Discussed plan of care with charge nurse.  Labs/tests ordered:  None    Kenard Gower, DNP, MSN, FNP-BC [facility] [phone] (Monday-Friday 8:00 a.m. - 5:00 p.m.) [phone] (after hours)

## 2022-12-29 ENCOUNTER — Non-Acute Institutional Stay: Payer: Medicare HMO | Admitting: Orthopedic Surgery

## 2022-12-29 ENCOUNTER — Encounter: Payer: Self-pay | Admitting: Orthopedic Surgery

## 2022-12-29 DIAGNOSIS — Z0000 Encounter for general adult medical examination without abnormal findings: Secondary | ICD-10-CM

## 2022-12-29 NOTE — Progress Notes (Signed)
Subjective:   [name]. is a 89 y.o. male who presents for Medicare Annual/Subsequent preventive examination.  Visit Complete: In person  Patient Medicare AWV questionnaire was completed by the patient on 12/29/2022; I have confirmed that all information answered by patient is correct and no changes since this date.  Cardiac Risk Factors include: advanced age (>69men, >68 women);hypertension;sedentary lifestyle;male gender;dyslipidemia     Objective:    Today's Vitals   12/29/22 1302 12/29/22 1334  BP: (!) 179/52 (!) 144/78  Pulse: 67   Resp: 20   Temp: (!) 97.3 F (36.3 C)   SpO2: 96%   Weight: 147 lb 6.4 oz (66.9 kg)   Height: 5\' 9"  (1.753 m)   PainSc:  0-No pain   Body mass index is 21.77 kg/m.     11/09/2022    9:45 AM 10/04/2022   12:22 PM 09/09/2022   12:14 PM 08/27/2022    9:49 AM 08/20/2022    9:58 AM 08/13/2022    9:39 AM 08/11/2022   10:19 AM  Advanced Directives  Does Patient Have a Medical Advance Directive? Yes Yes Yes Yes Yes Yes Yes  Type of Estate agent of [place];Living will;Out of facility DNR (pink MOST or yellow form) Healthcare Power of [place];Out of facility DNR (pink MOST or yellow form) Healthcare Power of [place];Out of facility DNR (pink MOST or yellow form) Healthcare Power of [place];Out of facility DNR (pink MOST or yellow form) Healthcare Power of [place];Out of facility DNR (pink MOST or yellow form) Healthcare Power of [place];Out of facility DNR (pink MOST or yellow form) Healthcare Power of [place];Out of facility DNR (pink MOST or yellow form)  Does patient want to make changes to medical advance directive? Yes (Inpatient - patient defers changing a medical advance directive at this time - Information given) No - Patient declined No - Patient declined No - Patient declined No - Patient declined No - Patient declined No - Patient declined  Copy of Healthcare Power of Attorney in Chart? Yes - validated most  recent copy scanned in chart (See row information) Yes - validated most recent copy scanned in chart (See row information) Yes - validated most recent copy scanned in chart (See row information) Yes - validated most recent copy scanned in chart (See row information) Yes - validated most recent copy scanned in chart (See row information) Yes - validated most recent copy scanned in chart (See row information) Yes - validated most recent copy scanned in chart (See row information)    Current Medications (verified) Outpatient Encounter Medications as of 12/29/2022  Medication Sig   acetaminophen (TYLENOL) 500 MG tablet Take 2 tablets (1,000 mg total) by mouth 2 (two) times daily as needed. (Patient taking differently: Take 1,000 mg by mouth 2 (two) times daily.)   atorvastatin (LIPITOR) 10 MG tablet TAKE 1 TABLET EVERY DAY AT 6PM   Cholecalciferol (VITAMIN D3 PO) Take 2,000 Units by mouth daily.   Cyanocobalamin 2500 MCG SUBL Place 1 tablet under the tongue daily.   divalproex (DEPAKOTE) 125 MG DR tablet Take 250 mg by mouth at bedtime.   escitalopram (LEXAPRO) 5 MG tablet Take 5 mg by mouth daily.   ferrous sulfate 325 (65 FE) MG EC tablet TAKE 1 TABLET BY MOUTH EVERY DAY   fish oil-omega-3 fatty acids 1000 MG capsule Take 1 g by mouth every morning.   levothyroxine (SYNTHROID) 50 MCG tablet TAKE 1 TABLET BY MOUTH IN THE MORNING   LORazepam (ATIVAN)  0.5 MG tablet Take 0.5 mg by mouth every 12 (twelve) hours as needed for anxiety.   METOPROLOL TARTRATE PO Take 12.5 mg by mouth in the morning and at bedtime.   nitroGLYCERIN (NITROSTAT) 0.4 MG SL tablet Place 1 tablet (0.4 mg total) under the tongue every 5 (five) minutes as needed for chest pain.   QUEtiapine (SEROQUEL) 25 MG tablet Take 1 tablet (25 mg total) by mouth daily in the afternoon. Give at 2 pm   simethicone (MYLICON) 80 MG chewable tablet Chew 80 mg by mouth every 12 (twelve) hours as needed for flatulence.   No facility-administered  encounter medications on file as of 12/29/2022.    Allergies (verified) Other   History: Past Medical History:  Diagnosis Date   Complication of anesthesia    "serious cognisance problems since my OR in January" (03/13/2013   Coronary artery disease    Diverticulosis    Hiatal hernia    Hx of echocardiogram    Echo 5/16:  Mild focal basal septal hypertrophy, EF 55%, mild AI, MV repair ok with trivial MR (mean 3 mmHg), mild LAE, mild RAE, PASP 35 mmHg   Hypertension    Hypothyroidism    Irritable bowel syndrome    PAF (paroxysmal atrial fibrillation) (HCC) 05/03/2014   Personal history of colonic polyps    tubular adenoma   Pleural effusion, bilateral 03/12/2013   Small to moderate, L>R   Prostate cancer [facility]) 1991   sees Dr. Gala Lewandowsky at [facility], observation only    S/P CABG x 2 02/28/2013   LIMA to LAD, SVG to OM1, EVH via right thigh   S/P Maze operation for atrial fibrillation 02/28/2013   Complete bilateral atrial lesion set using bipolar radiofrequency and cryothermy ablation with clipping of LA appendage   S/P mitral valve repair, maze procedure, and CABG x2 02/28/2013   Complex valvuloplasty including triangular resection of posterior leaflet, 30 mm Sorin Memo 3D ring annuloplasty   Past Surgical History:  Procedure Laterality Date   CARDIAC CATHETERIZATION  2015   CATARACT EXTRACTION, BILATERAL Bilateral 2014   per Dr. Nile Riggs    COLONOSCOPY  2011   per Dr. Jarold Motto, benign polyps, no repeats needed    CORONARY ARTERY BYPASS GRAFT N/A 02/28/2013   Procedure: CORONARY ARTERY BYPASS GRAFTING (CABG);  Surgeon: Purcell Nails, MD;  Location: [facility] OR;  Service: Open Heart Surgery;  Laterality: N/A;  CABG x 2,  using left internal mammary artery and right leg saphenous vein harvested endoscopically   ESOPHAGOGASTRODUODENOSCOPY (EGD) WITH PROPOFOL N/A 01/11/2019   Procedure: ESOPHAGOGASTRODUODENOSCOPY (EGD) WITH PROPOFOL;  Surgeon: Sherrilyn Rist, MD;  Location: [facility] ENDOSCOPY;   Service: Gastroenterology;  Laterality: N/A;   HEMORRHOID SURGERY     INTRAOPERATIVE TRANSESOPHAGEAL ECHOCARDIOGRAM N/A 02/28/2013   Procedure: INTRAOPERATIVE TRANSESOPHAGEAL ECHOCARDIOGRAM;  Surgeon: Purcell Nails, MD;  Location: [facility] OR;  Service: Open Heart Surgery;  Laterality: N/A;   LEFT HEART CATHETERIZATION WITH CORONARY ANGIOGRAM N/A 02/26/2013   Procedure: LEFT HEART CATHETERIZATION WITH CORONARY ANGIOGRAM;  Surgeon: Marykay Lex, MD;  Location: [facility] CATH LAB;  Service: Cardiovascular;  Laterality: N/A;   MAZE N/A 02/28/2013   Procedure: MAZE;  Surgeon: Purcell Nails, MD;  Location: [facility] OR;  Service: Open Heart Surgery;  Laterality: N/A;   MITRAL VALVE REPAIR N/A 02/28/2013   Procedure: MITRAL VALVE REPAIR (MVR);  Surgeon: Purcell Nails, MD;  Location: [facility] OR;  Service: Open Heart Surgery;  Laterality: N/A;   PROSTATE BIOPSY  TRANSURETHRAL RESECTION OF PROSTATE N/A 02/04/2017   Procedure: TRANSURETHRAL RESECTION OF THE PROSTATE (TURP);  Surgeon: Sebastian Ache, MD;  Location: [facility] ORS;  Service: Urology;  Laterality: N/A;   Family History  Problem Relation Age of Onset   Hypertension Mother    Sudden death Mother    Stroke Mother    Heart disease Mother    Hypertension Father    Sudden death Father    Stroke Father    Heart disease Father    Heart disease Son    Diabetes Son    Lung cancer Brother    Stomach cancer Brother    Cancer Daughter        rebdomyosarcoma   Kidney cancer Brother        mets   Diabetes Sister    Social History   Socioeconomic History   Marital status: Married    Spouse name: [name]   Number of children: 3   Years of education: Not on file   Highest education level: Not on file  Occupational History   Occupation: Retired  Tobacco Use   Smoking status: Never   Smokeless tobacco: Never  Vaping Use   Vaping status: Never Used  Substance and Sexual Activity   Alcohol use: No    Alcohol/week: 0.0 standard drinks of alcohol   Drug use:  No   Sexual activity: Not Currently  Other Topics Concern   Not on file  Social History Narrative   Married retired Agricultural consultant   Lives at [employer], independent living though his wife is in a wheelchair and they help each other   1 son lives in [place] a daughter lives in [place]   Never smoker no alcohol or drugs   Social Determinants of Health   Financial Resource Strain: Low Risk  (12/29/2022)   Overall Financial Resource Strain (CARDIA)    Difficulty of Paying Living Expenses: Not hard at all  Food Insecurity: No Food Insecurity (12/29/2022)   Hunger Vital Sign    Worried About Running Out of Food in the Last Year: Never true    Ran Out of Food in the Last Year: Never true  Transportation Needs: No Transportation Needs (12/29/2022)   PRAPARE - Administrator, Civil Service (Medical): No    Lack of Transportation (Non-Medical): No  Physical Activity: Inactive (12/29/2022)   Exercise Vital Sign    Days of Exercise per Week: 0 days    Minutes of Exercise per Session: 0 min  Stress: No Stress Concern Present (12/29/2022)   [employer] of Occupational Health - Occupational Stress Questionnaire    Feeling of Stress : Not at all  Social Connections: Moderately Isolated (12/29/2022)   Social Connection and Isolation Panel [NHANES]    Frequency of Communication with Friends and Family: Twice a week    Frequency of Social Gatherings with Friends and Family: Twice a week    Attends Religious Services: Never    Database administrator or Organizations: No    Attends Engineer, structural: Never    Marital Status: Married    Tobacco Counseling Counseling given: Not Answered   Clinical Intake:  Pre-visit preparation completed: No  Pain : No/denies pain Pain Score: 0-No pain     BMI - recorded: 21.77 Nutritional Status: BMI of 19-24  Normal Nutritional Risks: None Diabetes: No  What is the last grade level you  completed in school?: high school and 1 years at [employer]  Interpreter Needed?: No      Activities of Daily Living    12/29/2022    1:36 PM  In your present state of health, do you have any difficulty performing the following activities:  Hearing? 0  Vision? 0  Difficulty concentrating or making decisions? 1  Walking or climbing stairs? 1  Dressing or bathing? 1  Doing errands, shopping? 1  Preparing Food and eating ? Y  Using the Toilet? Y  In the past six months, have you accidently leaked urine? Y  Do you have problems with loss of bowel control? Y  Managing your Medications? Y  Managing your Finances? Y  Housekeeping or managing your Housekeeping? Y    Patient Care Team: Venita Sheffield, MD as PCP - General (Internal Medicine) Verner Chol, [facility] (Inactive) as Pharmacist (Pharmacist) Mahlon Gammon, MD as Consulting Physician (Internal Medicine)  Indicate any recent Medical Services you may have received from other than [facility] providers in the past year (date may be approximate).     Assessment:   This is a routine wellness examination for [employer].  Hearing/Vision screen No results found.   Goals Addressed             This Visit's Progress    Maintain Mobility and Function   On track    Evidence-based guidance:  Acknowledge and validate impact of pain, loss of strength and potential disfigurement (hand osteoarthritis) on mental health and daily life, such as social isolation, anxiety, depression, impaired sexual relationship and   injury from falls.  Anticipate referral to physical or occupational therapy for assessment, therapeutic exercise and recommendation for adaptive equipment or assistive devices; encourage participation.  Assess impact on ability to perform activities of daily living, as well as engage in sports and leisure events or requirements of work or school.  Provide anticipatory guidance and reassurance about the benefit of  exercise to maintain function; acknowledge and normalize fear that exercise may worsen symptoms.  Encourage regular exercise, at least 10 minutes at a time for 45 minutes per week; consider yoga, water exercise and proprioceptive exercises; encourage use of wearable activity tracker to increase motivation and adherence.  Encourage maintenance or resumption of daily activities, including employment, as pain allows and with minimal exposure to trauma.  Assist patient to advocate for adaptations to the work environment.  Consider level of pain and function, gender, age, lifestyle, patient preference, quality of life, readiness and ?ocapacity to benefit? when recommending patients for orthopaedic surgery consultation.  Explore strategies, such as changes to medication regimen or activity that enables patient to anticipate and manage flare-ups that increase deconditioning and disability.  Explore patient preferences; encourage exposure to a broader range of activities that have been avoided for fear of experiencing pain.  Identify barriers to participation in therapy or exercise, such as pain with activity, anticipated or imagined pain.  Monitor postoperative joint replacement or any preexisting joint replacement for ongoing pain and loss of function; provide social support and encouragement throughout recovery.   Notes:        Depression Screen    12/29/2022    1:38 PM 05/31/2022    3:57 PM 02/17/2022    9:30 AM 02/05/2022    9:54 AM 01/25/2022    1:33 PM 12/18/2021   10:13 AM 12/10/2021   11:55 AM  PHQ 2/9 Scores  PHQ - 2 Score 0 0 0 0 0 0 0  PHQ- 9 Score       3  Exception Documentation      Patient refusal     Fall Risk    12/29/2022    1:38 PM 08/11/2022    2:32 PM 05/31/2022    3:57 PM 02/05/2022    9:54 AM 01/25/2022    1:55 PM  Fall Risk   Falls in the past year? 1 0 1 1 1   Number falls in past yr: 1 0 1 1 1   Injury with Fall? 0 0 0 0 0  Risk for fall due to : History of  fall(s);Impaired balance/gait;Impaired mobility History of fall(s);Impaired balance/gait;Impaired mobility;Mental status change History of fall(s);Impaired balance/gait;Impaired mobility;Mental status change History of fall(s);Impaired balance/gait;Impaired mobility History of fall(s);Impaired balance/gait;Impaired mobility  Follow up Falls evaluation completed;Education provided;Falls prevention discussed Falls evaluation completed;Education provided;Falls prevention discussed Falls evaluation completed;Education provided;Falls prevention discussed Falls evaluation completed Falls evaluation completed    MEDICARE RISK AT HOME: Medicare Risk at Home Any stairs in or around the home?: No If so, are there any without handrails?: No Adequate lighting in your home to reduce risk of falls?: Yes Life alert?: No Use of a cane, walker or w/c?: Yes Grab bars in the bathroom?: Yes Shower chair or bench in shower?: Yes Elevated toilet seat or a handicapped toilet?: Yes  TIMED UP AND GO:  Was the test performed?  No    Cognitive Function:    12/18/2021   10:17 AM  MMSE - Mini Mental State Exam  Orientation to time 4  Orientation to Place 5  Registration 3  Attention/ Calculation 4  Recall 1  Language- name 2 objects 2  Language- repeat 1  Language- follow 3 step command 3  Language- read & follow direction 1  Write a sentence 1  Copy design 1  Total score 26        09/10/2020   11:17 AM  6CIT Screen  What Year? 0 points  What month? 0 points  What time? 0 points  Count back from 20 0 points  Months in reverse 0 points  Repeat phrase 0 points  Total Score 0 points    Immunizations Immunization History  Administered Date(s) Administered   Fluad Quad(high Dose 65+) 11/21/2018, 12/25/2019, 01/02/2021, 12/16/2021   Influenza Split 12/15/2006, 12/06/2007, 12/31/2009, 11/01/2011   Influenza Whole 12/15/2006, 12/06/2007, 12/31/2009   Influenza, High Dose Seasonal PF 11/11/2014,  11/12/2015, 11/15/2016, 11/18/2017, 11/21/2018, 01/02/2021   Influenza,inj,Quad PF,6+ Mos 11/01/2012, 11/09/2013   Influenza-Unspecified 11/01/2011, 11/01/2012, 11/09/2013   Moderna SARS-COV2 Booster Vaccination 01/07/2020   Moderna Sars-Covid-2 Vaccination 02/26/2019, 03/26/2019   PFIZER(Purple Top)SARS-COV-2 Vaccination 12/29/2021   Pneumococcal Conjugate-13 11/09/2013   Pneumococcal Polysaccharide-23 02/23/2004   Td 02/22/2002   Td (Adult),5 Lf Tetanus Toxid, Preservative Free 02/22/2002   Tdap 01/22/2014    TDAP status: Up to date  Flu Vaccine status: Up to date  Pneumococcal vaccine status: Up to date  Covid-19 vaccine status: Completed vaccines  Qualifies for Shingles Vaccine? Yes   Zostavax completed No   Shingrix Completed?: No.    Education has been provided regarding the importance of this vaccine. Patient has been advised to call insurance company to determine out of pocket expense if they have not yet received this vaccine. Advised may also receive vaccine at local pharmacy or Health Dept. Verbalized acceptance and understanding.  Screening Tests Health Maintenance  Topic Date Due   Zoster Vaccines- Shingrix (1 of 2) Never done   INFLUENZA VACCINE  09/23/2022   COVID-19 Vaccine (4 - 2023-24 season) 10/24/2022   Medicare  Annual Wellness (AWV)  12/29/2023   DTaP/Tdap/Td (4 - Td or Tdap) 01/23/2024   Pneumonia Vaccine 29+ Years old  Completed   HPV VACCINES  Aged Out   Colonoscopy  Discontinued    Health Maintenance  Health Maintenance Due  Topic Date Due   Zoster Vaccines- Shingrix (1 of 2) Never done   INFLUENZA VACCINE  09/23/2022   COVID-19 Vaccine (4 - 2023-24 season) 10/24/2022    Colorectal cancer screening: No longer required.   Lung Cancer Screening: (Low Dose CT Chest recommended if Age 3-80 years, 20 pack-year currently smoking OR have quit w/in 15years.) does not qualify.   Lung Cancer Screening Referral: No  Additional  Screening:  Hepatitis C Screening: does not qualify; Completed   Vision Screening: Recommended annual ophthalmology exams for early detection of glaucoma and other disorders of the eye. Is the patient up to date with their annual eye exam?  Yes  Who is the provider or what is the name of the office in which the patient attends annual eye exams? Dr.Shapiro If pt is not established with a provider, would they like to be referred to a provider to establish care? No .   Dental Screening: Recommended annual dental exams for proper oral hygiene  Diabetic Foot Exam: Diabetic Foot Exam: Completed 12/29/2022  Community Resource Referral / Chronic Care Management: CRR required this visit?  No   CCM required this visit?  No     Plan:     I have personally reviewed and noted the following in the patient's chart:   Medical and social history Use of alcohol, tobacco or illicit drugs  Current medications and supplements including opioid prescriptions. Patient is not currently taking opioid prescriptions. Functional ability and status Nutritional status Physical activity Advanced directives List of other physicians Hospitalizations, surgeries, and ER visits in previous 12 months Vitals Screenings to include cognitive, depression, and falls Referrals and appointments  In addition, I have reviewed and discussed with patient certain preventive protocols, quality metrics, and best practice recommendations. A written personalized care plan for preventive services as well as general preventive health recommendations were provided to patient.     Octavia Heir, NP   12/29/2022   After Visit Summary: (MyChart) Due to this being a telephonic visit, the after visit summary with patients personalized plan was offered to patient via MyChart   Nurse Notes: Lives in SNF, wife answered some questions for encounter, unable to do MMSE due to advanced dementia, UTD on vaccinations except Shingles> wife not  interested.

## 2022-12-31 ENCOUNTER — Non-Acute Institutional Stay (SKILLED_NURSING_FACILITY): Payer: Self-pay | Admitting: Nurse Practitioner

## 2022-12-31 ENCOUNTER — Encounter: Payer: Self-pay | Admitting: Nurse Practitioner

## 2022-12-31 DIAGNOSIS — N135 Crossing vessel and stricture of ureter without hydronephrosis: Secondary | ICD-10-CM | POA: Diagnosis not present

## 2022-12-31 DIAGNOSIS — F03918 Unspecified dementia, unspecified severity, with other behavioral disturbance: Secondary | ICD-10-CM | POA: Diagnosis not present

## 2022-12-31 DIAGNOSIS — I10 Essential (primary) hypertension: Secondary | ICD-10-CM | POA: Diagnosis not present

## 2022-12-31 DIAGNOSIS — D62 Acute posthemorrhagic anemia: Secondary | ICD-10-CM | POA: Diagnosis not present

## 2022-12-31 DIAGNOSIS — R319 Hematuria, unspecified: Secondary | ICD-10-CM | POA: Diagnosis not present

## 2022-12-31 DIAGNOSIS — E782 Mixed hyperlipidemia: Secondary | ICD-10-CM | POA: Diagnosis not present

## 2022-12-31 DIAGNOSIS — F0393 Unspecified dementia, unspecified severity, with mood disturbance: Secondary | ICD-10-CM

## 2022-12-31 DIAGNOSIS — E039 Hypothyroidism, unspecified: Secondary | ICD-10-CM | POA: Diagnosis not present

## 2022-12-31 DIAGNOSIS — F03C11 Unspecified dementia, severe, with agitation: Secondary | ICD-10-CM

## 2022-12-31 NOTE — Assessment & Plan Note (Signed)
Prostate cancer, s/p TURP 2019, L ureter obstructin s/p stent exchage 09/15/22, on Lupron q3 months,  s/p cystoscopy, followed by Urology.

## 2022-12-31 NOTE — Assessment & Plan Note (Addendum)
takes Depakote, Lexapro, Seroquel, prn Lorazepam.  reported occasional agitation, yelling, combative, refusing medication.

## 2022-12-31 NOTE — Assessment & Plan Note (Signed)
reported occasional agitation, yelling, combative, refusing medication.

## 2022-12-31 NOTE — Assessment & Plan Note (Signed)
taking Levothyroxine, TSH 3.01 07/26/22

## 2022-12-31 NOTE — Progress Notes (Unsigned)
Location:  [facility] Room Number: 16A Place of Service:  SNF (31) Provider:  Kyden Potash X Julieta Rogalski,NP  Venita Sheffield, MD  Patient Care Team: Venita Sheffield, MD as PCP - General (Internal Medicine) Verner Chol, [facility] (Inactive) as Pharmacist (Pharmacist) Mahlon Gammon, MD as Consulting Physician (Internal Medicine)  Extended Emergency Contact Information Primary Emergency Contact: Leavelle,Gerry Address: [address]          [place] [address]          [place], [place] [address] D[place] of [place] Home Phone: [phone] Relation: Spouse Secondary Emergency Contact: Sherfield,Larry Address: [address]          [place], [place] [address] D[place] of [place] Mobile Phone: [phone] Relation: Son  Code Status:  DNR Goals of care: Advanced Directive information    12/31/2022    3:19 PM  Advanced Directives  Does Patient Have a Medical Advance Directive? Yes  Type of Estate agent of [place];Living will;Out of facility DNR (pink MOST or yellow form)  Does patient want to make changes to medical advance directive? No - Patient declined  Copy of Healthcare Power of Attorney in Chart? Yes - validated most recent copy scanned in chart (See row information)     Chief Complaint  Patient presents with  . Acute Visit    Patient is being seen for blood in urine     HPI:  Pt is a 89 y.o. male seen today for an acute visit for reported blood in his urine, has upcoming cystoscopy 12/26/22 for left ureteral stent exchange. The patient denied burning or painful urination, denied lower abd or back discomfort, he is afebrile, in his usual state of health today.    Dementia, reported occasional agitation, yelling, combative, refusing medication.              Depression/anxiety, takes Depakote, Lexapro, Seroquel, prn Lorazepam.              HTN, on Metoprolol, Bun/creat 19/1.0 11/09/22             Hypothyroidism, taking Levothyroxine, TSH  3.01 07/26/22             Prostate cancer, s/p TURP 2019, L ureter obstructin s/p stent exchage 09/15/22, on Lupron q3 months,  s/p cystoscopy, followed by Urology             IDA Hgb 11.7 11/09/22, On Fe             Vit B12 deficiency, on supplement             HLD LDL 39 11/09/22, takes Atovastatin.      Past Medical History:  Diagnosis Date  . Complication of anesthesia    "serious cognisance problems since my OR in January" (03/13/2013  . Coronary artery disease   . Diverticulosis   . Hiatal hernia   . Hx of echocardiogram    Echo 5/16:  Mild focal basal septal hypertrophy, EF 55%, mild AI, MV repair ok with trivial MR (mean 3 mmHg), mild LAE, mild RAE, PASP 35 mmHg  . Hypertension   . Hypothyroidism   . Irritable bowel syndrome   . PAF (paroxysmal atrial fibrillation) (HCC) 05/03/2014  . Personal history of colonic polyps    tubular adenoma  . Pleural effusion, bilateral 03/12/2013   Small to moderate, L>R  . Prostate cancer [facility]) 1991   sees Dr. Gala Lewandowsky at [facility], observation only   . S/P CABG x 2  02/28/2013   LIMA to LAD, SVG to OM1, EVH via right thigh  . S/P Maze operation for atrial fibrillation 02/28/2013   Complete bilateral atrial lesion set using bipolar radiofrequency and cryothermy ablation with clipping of LA appendage  . S/P mitral valve repair, maze procedure, and CABG x2 02/28/2013   Complex valvuloplasty including triangular resection of posterior leaflet, 30 mm Sorin Memo 3D ring annuloplasty   Past Surgical History:  Procedure Laterality Date  . CARDIAC CATHETERIZATION  2015  . CATARACT EXTRACTION, BILATERAL Bilateral 2014   per Dr. Nile Riggs   . COLONOSCOPY  2011   per Dr. Jarold Motto, benign polyps, no repeats needed   . CORONARY ARTERY BYPASS GRAFT N/A 02/28/2013   Procedure: CORONARY ARTERY BYPASS GRAFTING (CABG);  Surgeon: Purcell Nails, MD;  Location: [facility] OR;  Service: Open Heart Surgery;  Laterality: N/A;  CABG x 2,  using left internal mammary artery and right  leg saphenous vein harvested endoscopically  . ESOPHAGOGASTRODUODENOSCOPY (EGD) WITH PROPOFOL N/A 01/11/2019   Procedure: ESOPHAGOGASTRODUODENOSCOPY (EGD) WITH PROPOFOL;  Surgeon: Sherrilyn Rist, MD;  Location: [facility] ENDOSCOPY;  Service: Gastroenterology;  Laterality: N/A;  . HEMORRHOID SURGERY    . INTRAOPERATIVE TRANSESOPHAGEAL ECHOCARDIOGRAM N/A 02/28/2013   Procedure: INTRAOPERATIVE TRANSESOPHAGEAL ECHOCARDIOGRAM;  Surgeon: Purcell Nails, MD;  Location: [facility] OR;  Service: Open Heart Surgery;  Laterality: N/A;  . LEFT HEART CATHETERIZATION WITH CORONARY ANGIOGRAM N/A 02/26/2013   Procedure: LEFT HEART CATHETERIZATION WITH CORONARY ANGIOGRAM;  Surgeon: Marykay Lex, MD;  Location: [facility] CATH LAB;  Service: Cardiovascular;  Laterality: N/A;  . MAZE N/A 02/28/2013   Procedure: MAZE;  Surgeon: Purcell Nails, MD;  Location: [facility] OR;  Service: Open Heart Surgery;  Laterality: N/A;  . MITRAL VALVE REPAIR N/A 02/28/2013   Procedure: MITRAL VALVE REPAIR (MVR);  Surgeon: Purcell Nails, MD;  Location: [facility] OR;  Service: Open Heart Surgery;  Laterality: N/A;  . PROSTATE BIOPSY    . TRANSURETHRAL RESECTION OF PROSTATE N/A 02/04/2017   Procedure: TRANSURETHRAL RESECTION OF THE PROSTATE (TURP);  Surgeon: Sebastian Ache, MD;  Location: [facility] ORS;  Service: Urology;  Laterality: N/A;    Allergies  Allergen Reactions  . Other Other (See Comments)    ANESTHESIA.... 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 ANESTHESIA.... 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 Occurred post op CABG 2015, required readmission and rehab    Outpatient Encounter Medications as of 12/31/2022  Medication Sig  . acetaminophen (TYLENOL) 325 MG tablet Take 650 mg by mouth every 4 (four) hours as needed.  [name] acetaminophen (TYLENOL) 500 MG tablet Take 2 tablets (1,000 mg total) by mouth 2 (two) times daily as needed. (Patient taking differently: Take 1,000 mg by mouth 2 (two) times daily.)  . atorvastatin  (LIPITOR) 10 MG tablet TAKE 1 TABLET EVERY DAY AT 6PM  . Cholecalciferol (VITAMIN D3 PO) Take 2,000 Units by mouth daily.  . Cyanocobalamin 2500 MCG SUBL Place 1 tablet under the tongue daily.  . divalproex (DEPAKOTE) 125 MG DR tablet Take 250 mg by mouth at bedtime.  [name] escitalopram (LEXAPRO) 5 MG tablet Take 5 mg by mouth daily.  . ferrous sulfate 325 (65 FE) MG EC tablet TAKE 1 TABLET BY MOUTH EVERY DAY  . fish oil-omega-3 fatty acids 1000 MG capsule Take 1 g by mouth every morning.  [name] levothyroxine (SYNTHROID) 50 MCG tablet TAKE 1 TABLET BY MOUTH IN THE MORNING  . LORazepam (ATIVAN) 0.5 MG tablet Take 0.5  mg by mouth every 12 (twelve) hours as needed for anxiety.  [name] METOPROLOL TARTRATE PO Take 12.5 mg by mouth in the morning and at bedtime.  . nitroGLYCERIN (NITROSTAT) 0.4 MG SL tablet Place 1 tablet (0.4 mg total) under the tongue every 5 (five) minutes as needed for chest pain.  [name] QUEtiapine (SEROQUEL) 25 MG tablet Take 1 tablet (25 mg total) by mouth daily in the afternoon. Give at 2 pm  . simethicone (MYLICON) 80 MG chewable tablet Chew 80 mg by mouth every 12 (twelve) hours as needed for flatulence.   No facility-administered encounter medications on file as of 12/31/2022.    Review of Systems  Unable to perform ROS: Dementia  Gastrointestinal:  Negative for abdominal pain, nausea and vomiting.  Genitourinary:  Positive for hematuria. Negative for dysuria and urgency.  Musculoskeletal:  Positive for gait problem.    Immunization History  Administered Date(s) Administered  . Fluad Quad(high Dose 65+) 11/21/2018, 12/25/2019, 01/02/2021, 12/16/2021, 11/24/2022  . Influenza Split 12/15/2006, 12/06/2007, 12/31/2009, 11/01/2011  . Influenza Whole 12/15/2006, 12/06/2007, 12/31/2009  . Influenza, High Dose Seasonal PF 11/11/2014, 11/12/2015, 11/15/2016, 11/18/2017, 11/21/2018, 01/02/2021  . Influenza,inj,Quad PF,6+ Mos 11/01/2012, 11/09/2013  . Influenza-Unspecified 11/01/2011,  11/01/2012, 11/09/2013  . Moderna Covid-19 Vaccine Bivalent Booster 48yrs & up 12/08/2022  . Moderna SARS-COV2 Booster Vaccination 01/07/2020  . Moderna Sars-Covid-2 Vaccination 02/26/2019, 03/26/2019  . PFIZER(Purple Top)SARS-COV-2 Vaccination 12/29/2021  . Pneumococcal Conjugate-13 11/09/2013  . Pneumococcal Polysaccharide-23 02/23/2004  . Td 02/22/2002  . Td (Adult),5 Lf Tetanus Toxid, Preservative Free 02/22/2002  . Tdap 01/22/2014   Pertinent  Health Maintenance Due  Topic Date Due  . INFLUENZA VACCINE  Completed  . Colonoscopy  Discontinued      01/25/2022    1:55 PM 02/05/2022    9:54 AM 05/31/2022    3:57 PM 08/11/2022    2:32 PM 12/29/2022    1:38 PM  Fall Risk  Falls in the past year? 1 1 1  0 1  Was there an injury with Fall? 0 0 0 0 0  Fall Risk Category Calculator 2 2 2  0 2  Fall Risk Category (Retired) Moderate Moderate     (RETIRED) Patient Fall Risk Level Moderate fall risk Moderate fall risk     Patient at Risk for Falls Due to History of fall(s);Impaired balance/gait;Impaired mobility History of fall(s);Impaired balance/gait;Impaired mobility History of fall(s);Impaired balance/gait;Impaired mobility;Mental status change History of fall(s);Impaired balance/gait;Impaired mobility;Mental status change History of fall(s);Impaired balance/gait;Impaired mobility  Fall risk Follow up Falls evaluation completed Falls evaluation completed Falls evaluation completed;Education provided;Falls prevention discussed Falls evaluation completed;Education provided;Falls prevention discussed Falls evaluation completed;Education provided;Falls prevention discussed   Functional Status Survey:    Vitals:   12/31/22 1514  BP: (!) 179/52  Pulse: 67  Resp: 20  Temp: (!) 97.3 F (36.3 C)  TempSrc: Temporal  SpO2: 96%  Weight: 147 lb 6.4 oz (66.9 kg)  Height: 5\' 9"  (1.753 m)   Body mass index is 21.77 kg/m. Physical Exam Vitals and nursing note reviewed.  Constitutional:       Appearance: Normal appearance.  HENT:     Head: Normocephalic and atraumatic.     Nose: Nose normal.     Mouth/Throat:     Mouth: Mucous membranes are moist.  Eyes:     Extraocular Movements: Extraocular movements intact.     Conjunctiva/sclera: Conjunctivae normal.     Pupils: Pupils are equal, round, and reactive to light.  Cardiovascular:     Rate and Rhythm:  Normal rate and regular rhythm.     Heart sounds: No murmur heard. Pulmonary:     Effort: Pulmonary effort is normal.     Breath sounds: No rales.  Abdominal:     General: Bowel sounds are normal. There is no distension.     Palpations: Abdomen is soft.     Tenderness: There is no abdominal tenderness. There is no right CVA tenderness, left CVA tenderness, guarding or rebound.  Musculoskeletal:     Cervical back: Normal range of motion and neck supple.     Right lower leg: No edema.     Left lower leg: No edema.  Skin:    General: Skin is warm and dry.  Neurological:     General: No focal deficit present.     Mental Status: He is alert.     Motor: No weakness.     Gait: Gait abnormal.     Comments: Oriented to self.   Psychiatric:        Mood and Affect: Mood normal.    Labs reviewed: Recent Labs    07/26/22 0000 09/16/22 0000 11/09/22 0000  NA 137 138 140  K 4.0 4.0 4.2  CL 106 104 105  CO2 26* 29* 27*  BUN 16 20 19   CREATININE 1.0 1.1 1.0  CALCIUM 8.7 8.5* 8.6*   Recent Labs    07/26/22 0000 09/16/22 0000 11/09/22 0000  AST 14 12* 19  ALT 9* 10 18  ALKPHOS 55 52 58  ALBUMIN 3.1* 2.9* 3.2*   Recent Labs    02/18/22 0000 07/26/22 0000 11/09/22 0000  WBC 6.9 4.3 4.4  NEUTROABS  --  2,877.00  --   HGB 10.7* 11.6* 11.7*  HCT 32* 35* 36*  PLT 182 145* 158   Lab Results  Component Value Date   TSH 2.04 11/09/2022   Lab Results  Component Value Date   HGBA1C 5.6 07/01/2016   Lab Results  Component Value Date   CHOL 103 11/09/2022   HDL 46 11/09/2022   LDLCALC 39 11/09/2022    LDLDIRECT 159.3 05/17/2007   TRIG 99 11/09/2022   CHOLHDL 2 01/28/2021    Significant Diagnostic Results in last 30 days:  No results found.  Assessment/Plan Hematuria blood in his urine, has upcoming cystoscopy 12/26/22 for left ureteral stent exchange. The patient denied burning or painful urination, denied lower abd or back discomfort, he is afebrile, in his usual state of health today. Obtain UA C/S, CBC/diff in am.   Ureteral stricture, left   Prostate cancer, s/p TURP 2019, L ureter obstructin s/p stent exchage 09/15/22, on Lupron q3 months,  s/p cystoscopy, followed by Urology.   Dementia with agitation (HCC) reported occasional agitation, yelling, combative, refusing medication.   Dementia, senile with depression, with behavioral disturbance (HCC) takes Depakote, Lexapro, Seroquel, prn Lorazepam.  reported occasional agitation, yelling, combative, refusing medication.   Essential hypertension on Metoprolol, Bun/creat 19/1.0 11/09/22  Hypothyroidism taking Levothyroxine, TSH 3.01 07/26/22  Acute blood loss anemia  Hgb 11.7 11/09/22, On Fe  HLD (hyperlipidemia) LDL 39 11/09/22, takes Atovastatin.      Family/ staff Communication: plan of care reviewed with the patient and charge nurse.   Labs/tests ordered:  UA C/S, CBC/diff  Time spend 30 minutes.

## 2022-12-31 NOTE — Assessment & Plan Note (Signed)
on Metoprolol, Bun/creat 19/1.0 11/09/22

## 2022-12-31 NOTE — Assessment & Plan Note (Signed)
Hgb 11.7 11/09/22, On Fe

## 2022-12-31 NOTE — Assessment & Plan Note (Signed)
LDL 39 11/09/22, takes Atovastatin.

## 2022-12-31 NOTE — Assessment & Plan Note (Signed)
blood in his urine, has upcoming cystoscopy 12/26/22 for left ureteral stent exchange. The patient denied burning or painful urination, denied lower abd or back discomfort, he is afebrile, in his usual state of health today. Obtain UA C/S, CBC/diff in am.

## 2023-01-01 DIAGNOSIS — N390 Urinary tract infection, site not specified: Secondary | ICD-10-CM | POA: Diagnosis not present

## 2023-01-01 DIAGNOSIS — I10 Essential (primary) hypertension: Secondary | ICD-10-CM | POA: Diagnosis not present

## 2023-01-01 LAB — CBC AND DIFFERENTIAL
HCT: 33 — AB (ref 41–53)
Hemoglobin: 10.6 — AB (ref 13.5–17.5)
Neutrophils Absolute: 2266
Platelets: 187 10*3/uL (ref 150–400)
WBC: 3.9

## 2023-01-01 LAB — CBC: RBC: 3.19 — AB (ref 3.87–5.11)

## 2023-01-03 ENCOUNTER — Encounter: Payer: Self-pay | Admitting: Nurse Practitioner

## 2023-01-05 DIAGNOSIS — Z960 Presence of urogenital implants: Secondary | ICD-10-CM | POA: Diagnosis not present

## 2023-01-05 DIAGNOSIS — N138 Other obstructive and reflux uropathy: Secondary | ICD-10-CM | POA: Diagnosis not present

## 2023-01-05 DIAGNOSIS — K219 Gastro-esophageal reflux disease without esophagitis: Secondary | ICD-10-CM | POA: Diagnosis not present

## 2023-01-05 DIAGNOSIS — N401 Enlarged prostate with lower urinary tract symptoms: Secondary | ICD-10-CM | POA: Diagnosis not present

## 2023-01-05 DIAGNOSIS — I10 Essential (primary) hypertension: Secondary | ICD-10-CM | POA: Diagnosis not present

## 2023-01-05 DIAGNOSIS — Z79899 Other long term (current) drug therapy: Secondary | ICD-10-CM | POA: Diagnosis not present

## 2023-01-05 DIAGNOSIS — E039 Hypothyroidism, unspecified: Secondary | ICD-10-CM | POA: Diagnosis not present

## 2023-01-05 DIAGNOSIS — I25110 Atherosclerotic heart disease of native coronary artery with unstable angina pectoris: Secondary | ICD-10-CM | POA: Diagnosis not present

## 2023-01-05 DIAGNOSIS — I480 Paroxysmal atrial fibrillation: Secondary | ICD-10-CM | POA: Diagnosis not present

## 2023-01-07 ENCOUNTER — Non-Acute Institutional Stay (SKILLED_NURSING_FACILITY): Payer: Medicare HMO | Admitting: Sports Medicine

## 2023-01-07 ENCOUNTER — Encounter: Payer: Self-pay | Admitting: Sports Medicine

## 2023-01-07 DIAGNOSIS — F03C11 Unspecified dementia, severe, with agitation: Secondary | ICD-10-CM

## 2023-01-07 DIAGNOSIS — R319 Hematuria, unspecified: Secondary | ICD-10-CM | POA: Diagnosis not present

## 2023-01-07 DIAGNOSIS — C61 Malignant neoplasm of prostate: Secondary | ICD-10-CM | POA: Diagnosis not present

## 2023-01-07 DIAGNOSIS — D509 Iron deficiency anemia, unspecified: Secondary | ICD-10-CM

## 2023-01-07 DIAGNOSIS — E039 Hypothyroidism, unspecified: Secondary | ICD-10-CM | POA: Diagnosis not present

## 2023-01-07 NOTE — Progress Notes (Unsigned)
Location:  [facility] Room Number: N016-A Place of Service:  SNF [phone]) Provider:  Venita Sheffield, MD  Patient Care Team: Venita Sheffield, MD as PCP - General (Internal Medicine) Verner Chol, [facility] (Inactive) as Pharmacist (Pharmacist) Mahlon Gammon, MD as Consulting Physician (Internal Medicine)  Extended Emergency Contact Information Primary Emergency Contact: Herbison,Gerry Address: [address]          [place] [address]          [place], [place] [address] Darden [place] of [place] Home Phone: [phone] Relation: Spouse Secondary Emergency Contact: Gene,Larry Address: [address]          [place], [place] [address] D[place] of [place] Mobile Phone: [phone] Relation: Son  Code Status:  DNR Goals of care: Advanced Directive information    01/07/2023   11:51 AM  Advanced Directives  Does Patient Have a Medical Advance Directive? Yes  Type of Estate agent of [place];Living will;Out of facility DNR (pink MOST or yellow form)  Does patient want to make changes to medical advance directive? No - Patient declined  Copy of Healthcare Power of Attorney in Chart? Yes - validated most recent copy scanned in chart (See row information)     Chief Complaint  Patient presents with   Follow-up    Routine Visit     HPI:  Pt is a 89 y.o. male seen today for medical management of chronic diseases.    Pt seen and examined in the living room , he is poor historian and majority of the history is obtained from nursing staff  Pt opened his eyes when called his name He struggles to tell his complete name  Does not where he is and cannot remember what he had for lunch  As per Nurse pt usually gets confused and wanders a lot after 3pm and bangs on the door some times He is pleasant during the interview today    Hematuria  Nurse reports that since he had stent placement , his urine is pale pink and gross hematuria  Pt  answers most of the questions in 1 word answers  Pt followed with urology and underwent cystoscopy bilateral retrograde pyelograms left ureteroscopy and dilation left stent replacement fluoroscopy  He is currently on  cipro as per urology   Rash on his Rt cheek  Nurse reported rash today  Pt cannot say if its itching   Prostate cancer, s/p TURP 2019, Follows with urology   Major Neurocognitive disorder Needs assistance with most of his ADLS  Intermittent confusion    Past Medical History:  Diagnosis Date   Complication of anesthesia    "serious cognisance problems since my OR in January" (03/13/2013   Coronary artery disease    Diverticulosis    Hiatal hernia    Hx of echocardiogram    Echo 5/16:  Mild focal basal septal hypertrophy, EF 55%, mild AI, MV repair ok with trivial MR (mean 3 mmHg), mild LAE, mild RAE, PASP 35 mmHg   Hypertension    Hypothyroidism    Irritable bowel syndrome    PAF (paroxysmal atrial fibrillation) (HCC) 05/03/2014   Personal history of colonic polyps    tubular adenoma   Pleural effusion, bilateral 03/12/2013   Small to moderate, L>R   Prostate cancer [facility]) 1991   sees Dr. Gala Lewandowsky at [facility], observation only    S/P CABG x 2 02/28/2013   LIMA to LAD, SVG to OM1, EVH via right thigh  S/P Maze operation for atrial fibrillation 02/28/2013   Complete bilateral atrial lesion set using bipolar radiofrequency and cryothermy ablation with clipping of LA appendage   S/P mitral valve repair, maze procedure, and CABG x2 02/28/2013   Complex valvuloplasty including triangular resection of posterior leaflet, 30 mm Sorin Memo 3D ring annuloplasty   Past Surgical History:  Procedure Laterality Date   CARDIAC CATHETERIZATION  2015   CATARACT EXTRACTION, BILATERAL Bilateral 2014   per Dr. Nile Riggs    COLONOSCOPY  2011   per Dr. Jarold Motto, benign polyps, no repeats needed    CORONARY ARTERY BYPASS GRAFT N/A 02/28/2013   Procedure: CORONARY ARTERY BYPASS GRAFTING  (CABG);  Surgeon: Purcell Nails, MD;  Location[facility] OR;  Service: Open Heart Surgery;  Laterality: N/A;  CABG x 2,  using left internal mammary artery and right leg saphenous vein harvested endoscopically   ESOPHAGOGASTRODUODENOSCOPY (EGD) WITH PROPOFOL N/A 01/11/2019   Procedure: ESOPHAGOGASTRODUODENOSCOPY (EGD) WITH PROPOFOL;  Surgeon: Sherrilyn Rist, MD;  Location: [facility] ENDOSCOPY;  Service: Gastroenterology;  Laterality: N/A;   HEMORRHOID SURGERY     INTRAOPERATIVE TRANSESOPHAGEAL ECHOCARDIOGRAM N/A 02/28/2013   Procedure: INTRAOPERATIVE TRANSESOPHAGEAL ECHOCARDIOGRAM;  Surgeon: Purcell Nails, MD;  Location: [facility] OR;  Service: Open Heart Surgery;  Laterality: N/A;   LEFT HEART CATHETERIZATION WITH CORONARY ANGIOGRAM N/A 02/26/2013   Procedure: LEFT HEART CATHETERIZATION WITH CORONARY ANGIOGRAM;  Surgeon: Marykay Lex, MD;  Location: [facility] CATH LAB;  Service: Cardiovascular;  Laterality: N/A;   MAZE N/A 02/28/2013   Procedure: MAZE;  Surgeon: Purcell Nails, MD;  Location[facility] OR;  Service: Open Heart Surgery;  Laterality: N/A;   MITRAL VALVE REPAIR N/A 02/28/2013   Procedure: MITRAL VALVE REPAIR (MVR);  Surgeon: Purcell Nails, MD;  Location: [facility] OR;  Service: Open Heart Surgery;  Laterality: N/A;   PROSTATE BIOPSY     TRANSURETHRAL RESECTION OF PROSTATE N/A 02/04/2017   Procedure: TRANSURETHRAL RESECTION OF THE PROSTATE (TURP);  Surgeon: Sebastian Ache, MD;  Location: [facility] ORS;  Service: Urology;  Laterality: N/A;    Allergies  Allergen Reactions   Other Other (See Comments)    ANESTHESIA.... 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 ANESTHESIA.... 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 Occurred post op CABG 2015, required readmission and rehab    Outpatient Encounter Medications as of 01/07/2023  Medication Sig   acetaminophen (TYLENOL) 325 MG tablet Take 650 mg by mouth every 4 (four) hours as needed.   acetaminophen (TYLENOL) 500 MG tablet Take 2 tablets  (1,000 mg total) by mouth 2 (two) times daily as needed. (Patient taking differently: Take 1,000 mg by mouth 2 (two) times daily.)   atorvastatin (LIPITOR) 10 MG tablet TAKE 1 TABLET EVERY DAY AT 6PM   Cholecalciferol (VITAMIN D3 PO) Take 2,000 Units by mouth daily.   ciprofloxacin (CIPRO) 500 MG tablet Take 500 mg by mouth every 12 (twelve) hours.   Cyanocobalamin 2500 MCG SUBL Place 1 tablet under the tongue daily.   divalproex (DEPAKOTE) 125 MG DR tablet Take 250 mg by mouth at bedtime.   escitalopram (LEXAPRO) 5 MG tablet Take 5 mg by mouth daily.   ferrous sulfate 325 (65 FE) MG EC tablet TAKE 1 TABLET BY MOUTH EVERY DAY   fish oil-omega-3 fatty acids 1000 MG capsule Take 1 g by mouth every morning.   levothyroxine (SYNTHROID) 50 MCG tablet TAKE 1 TABLET BY MOUTH IN THE MORNING   LORazepam (ATIVAN) 0.5 MG tablet Take  0.5 mg by mouth every 12 (twelve) hours as needed for anxiety.   METOPROLOL TARTRATE PO Take 12.5 mg by mouth in the morning and at bedtime.   nitroGLYCERIN (NITROSTAT) 0.4 MG SL tablet Place 1 tablet (0.4 mg total) under the tongue every 5 (five) minutes as needed for chest pain.   QUEtiapine (SEROQUEL) 25 MG tablet Take 1 tablet (25 mg total) by mouth daily in the afternoon. Give at 2 pm   simethicone (MYLICON) 80 MG chewable tablet Chew 80 mg by mouth every 12 (twelve) hours as needed for flatulence.   No facility-administered encounter medications on file as of 01/07/2023.    Review of Systems  Unable to perform ROS: Dementia  Constitutional:  Negative for appetite change.  Respiratory:  Negative for cough and shortness of breath.   Cardiovascular:  Negative for chest pain and leg swelling.  Gastrointestinal:  Negative for abdominal pain.  Genitourinary:  Positive for hematuria. Negative for dysuria.  Skin:  Positive for rash.  Psychiatric/Behavioral:  Positive for behavioral problems and confusion.     Immunization History  Administered Date(s) Administered    Fluad Quad(high Dose 65+) 11/21/2018, 12/25/2019, 01/02/2021, 12/16/2021, 11/24/2022   Influenza Split 12/15/2006, 12/06/2007, 12/31/2009, 11/01/2011   Influenza Whole 12/15/2006, 12/06/2007, 12/31/2009   Influenza, High Dose Seasonal PF 11/11/2014, 11/12/2015, 11/15/2016, 11/18/2017, 11/21/2018, 01/02/2021   Influenza,inj,Quad PF,6+ Mos 11/01/2012, 11/09/2013   Influenza-Unspecified 11/01/2011, 11/01/2012, 11/09/2013   Moderna Covid-19 Vaccine Bivalent Booster 69yrs & up 12/08/2022   Moderna SARS-COV2 Booster Vaccination 01/07/2020   Moderna Sars-Covid-2 Vaccination 02/26/2019, 03/26/2019   PFIZER(Purple Top)SARS-COV-2 Vaccination 12/29/2021   Pneumococcal Conjugate-13 11/09/2013   Pneumococcal Polysaccharide-23 02/23/2004   Td 02/22/2002   Td (Adult),5 Lf Tetanus Toxid, Preservative Free 02/22/2002   Tdap 01/22/2014   Pertinent  Health Maintenance Due  Topic Date Due   INFLUENZA VACCINE  Completed   Colonoscopy  Discontinued      01/25/2022    1:55 PM 02/05/2022    9:54 AM 05/31/2022    3:57 PM 08/11/2022    2:32 PM 12/29/2022    1:38 PM  Fall Risk  Falls in the past year? 1 1 1  0 1  Was there an injury with Fall? 0 0 0 0 0  Fall Risk Category Calculator 2 2 2  0 2  Fall Risk Category (Retired) Moderate Moderate     (RETIRED) Patient Fall Risk Level Moderate fall risk Moderate fall risk     Patient at Risk for Falls Due to History of fall(s);Impaired balance/gait;Impaired mobility History of fall(s);Impaired balance/gait;Impaired mobility History of fall(s);Impaired balance/gait;Impaired mobility;Mental status change History of fall(s);Impaired balance/gait;Impaired mobility;Mental status change History of fall(s);Impaired balance/gait;Impaired mobility  Fall risk Follow up Falls evaluation completed Falls evaluation completed Falls evaluation completed;Education provided;Falls prevention discussed Falls evaluation completed;Education provided;Falls prevention discussed Falls  evaluation completed;Education provided;Falls prevention discussed   Functional Status Survey:    Vitals:   01/07/23 1146  BP: 129/84  Pulse: 63  Resp: 20  Temp: (!) 97.3 F (36.3 C)  SpO2: 96%  Weight: 147 lb 6.4 oz (66.9 kg)  Height: 5\' 9"  (1.753 m)   Body mass index is 21.77 kg/m. Physical Exam Constitutional:      Appearance: Normal appearance.  HENT:     Head: Normocephalic and atraumatic.  Cardiovascular:     Rate and Rhythm: Normal rate and regular rhythm.     Pulses: Normal pulses.     Heart sounds: Normal heart sounds.  Pulmonary:     Effort: No  respiratory distress.     Breath sounds: No stridor. No wheezing or rales.  Abdominal:     General: Bowel sounds are normal. There is no distension.     Palpations: Abdomen is soft.     Tenderness: There is no abdominal tenderness. There is no right CVA tenderness or guarding.  Musculoskeletal:        General: No swelling.  Skin:    Findings: Rash present.     Comments: Blanchable erythema noted on his Rt cheek  Neurological:     Mental Status: He is alert. Mental status is at baseline.     Sensory: No sensory deficit.     Motor: No weakness.     Labs reviewed: Recent Labs    07/26/22 0000 09/16/22 0000 11/09/22 0000  NA 137 138 140  K 4.0 4.0 4.2  CL 106 104 105  CO2 26* 29* 27*  BUN 16 20 19   CREATININE 1.0 1.1 1.0  CALCIUM 8.7 8.5* 8.6*   Recent Labs    07/26/22 0000 09/16/22 0000 11/09/22 0000  AST 14 12* 19  ALT 9* 10 18  ALKPHOS 55 52 58  ALBUMIN 3.1* 2.9* 3.2*   Recent Labs    07/26/22 0000 11/09/22 0000 01/01/23 0000  WBC 4.3 4.4 3.9  NEUTROABS 2,877.00  --  2,266.00  HGB 11.6* 11.7* 10.6*  HCT 35* 36* 33*  PLT 145* 158 187   Lab Results  Component Value Date   TSH 2.04 11/09/2022   Lab Results  Component Value Date   HGBA1C 5.6 07/01/2016   Lab Results  Component Value Date   CHOL 103 11/09/2022   HDL 46 11/09/2022   LDLCALC 39 11/09/2022   LDLDIRECT 159.3  05/17/2007   TRIG 99 11/09/2022   CHOLHDL 2 01/28/2021    Significant Diagnostic Results in last 30 days:  No results found.  Assessment/Plan  Major Neurocognitive disorder  Pt with intermittent confusion  1 - No difficulty either subjectively or objectively  2 - Complains of forgetting the location of objects--subjective word-finding difficulties  3 - Decreased job functioning evident to co-workers. Difficulty in traveling to new locations. Decreased organizational capacity*  4 - Decreased ability to perform complex tasks (e.g., planning dinner for guests, handling. personal finances, difficulty marketing, etc.)  5 - Requires assistance in choosing proper clothing to wear for the day, season, or occasion (e.g., a patient may wear the same clothing repeatedly unless supervised)*  6a - Improperly putting on clothes without assistance or prompting (e.g., may put street clothes on overnight clothes, put shoes on wrong feet, or have difficulty buttoning clothing) occasionally or more frequently over the past weeks*  6b - Unable to bathe properly (e.g., difficulty adjusting bathwater temp.) occasionally or more frequently over the past weeks*  6c - Inability to handle mechanics of toileting (e.g., forgets to flush the toilet, does not wipe properly or properly dispose of toilet tissue) occasionally or more frequently over the past weeks*  6d - Urinary incontinence occasionally or more frequently over the past weeks*  6e - Fecal incontinence occasionally or more frequently over the past weeks*  7a - Ability to speak limited to approximately a half-dozen different intelligible words or fewer in an average day or the course of an intensive interview  7b - Speech ability is limited to using a single intelligible word on an average day or in an intensive interview (the person may repeat the word over and over)  7c - Ambulatory ability is  lost (cannot walk without personal assistance)  7d -  Cannot sit up without assistance  7e - Loss of ability to smile  13f - Loss of ability to hold head up independently  *Scored primarily based on information obtained from a knowledgeable informant   He score 7a - 7c   Pt is a poor historian and his speech is limited to 1 word answers He can follow commands  Seems pleasant during the interview but reported by nursing staff that he gets agitated mostly after 3pm  Will increase lexapro to 20 mg from 5 mg with the goal to decrease Benzo use due to fall risk  Will cont with depakote, seroquel, ativan prn   Hematuria  Will monitor  Cont with cipro   Prostate cancer Follow up with urology   HTN  Bp at goal  Cont with  metoprolol   Hypothyroidism Lab Results  Component Value Date   TSH 2.04 11/09/2022    IDA CBC (INCLUDES DIFF/PLT) WHITE BLOOD CELL COUNT 3.9 Thousand/u L 3.8-10.8 Final RED BLOOD CELL COUNT 3.19 Million/uL 4.20-5.80 L Final HEMOGLOBIN 10.6 g/dL 16.1-09.6 L Final HEMATOCRIT 32.9 % 38.5-50.0 L Final MCV 103.1 fL 80.0-100.0 H Final MCH 33.2 pg 27.0-33.0 H Final MCHC 32.2 g/dL 04.5-40.9 Fina Cont with  iron pills.      Family/ staff Communication: ***  Labs/tests ordered:  ***

## 2023-01-10 ENCOUNTER — Encounter: Payer: Self-pay | Admitting: Sports Medicine

## 2023-02-08 ENCOUNTER — Non-Acute Institutional Stay (SKILLED_NURSING_FACILITY): Payer: Medicare HMO | Admitting: Nurse Practitioner

## 2023-02-08 ENCOUNTER — Encounter: Payer: Self-pay | Admitting: Nurse Practitioner

## 2023-02-08 DIAGNOSIS — D6489 Other specified anemias: Secondary | ICD-10-CM | POA: Diagnosis not present

## 2023-02-08 DIAGNOSIS — F0393 Unspecified dementia, unspecified severity, with mood disturbance: Secondary | ICD-10-CM

## 2023-02-08 DIAGNOSIS — F03918 Unspecified dementia, unspecified severity, with other behavioral disturbance: Secondary | ICD-10-CM | POA: Diagnosis not present

## 2023-02-08 DIAGNOSIS — E039 Hypothyroidism, unspecified: Secondary | ICD-10-CM

## 2023-02-08 DIAGNOSIS — M159 Polyosteoarthritis, unspecified: Secondary | ICD-10-CM | POA: Diagnosis not present

## 2023-02-08 DIAGNOSIS — I10 Essential (primary) hypertension: Secondary | ICD-10-CM | POA: Diagnosis not present

## 2023-02-08 DIAGNOSIS — E538 Deficiency of other specified B group vitamins: Secondary | ICD-10-CM | POA: Diagnosis not present

## 2023-02-08 DIAGNOSIS — E782 Mixed hyperlipidemia: Secondary | ICD-10-CM | POA: Diagnosis not present

## 2023-02-08 DIAGNOSIS — N135 Crossing vessel and stricture of ureter without hydronephrosis: Secondary | ICD-10-CM | POA: Diagnosis not present

## 2023-02-08 NOTE — Progress Notes (Unsigned)
Location:  [facility] Room Number: 16-A Place of Service:  SNF [phone]) Provider:  Orlyn Odonoghue Cyndie Mull, MD  Patient Care Team: Venita Sheffield, MD as PCP - General (Internal Medicine) Verner Chol, [facility] (Inactive) as Pharmacist (Pharmacist) Mahlon Gammon, MD as Consulting Physician (Internal Medicine)  Extended Emergency Contact Information Primary Emergency Contact: Degrasse,Gerry Address: [address]          [place] [address]          [place], [place] [address] [place] Home Phone: [phone] Relation: Spouse Secondary Emergency Contact: Stoy,Larry Address: [address]          [place], [place] [address] [place] Mobile Phone: [phone] Relation: Son  Code Status:  DNR Goals of care: Advanced Directive information    02/08/2023   11:10 AM  Advanced Directives  Does Patient Have a Medical Advance Directive? Yes  Type of Estate agent of [place];Living will;Out of facility DNR (pink MOST or yellow form)  Does patient want to make changes to medical advance directive? No - Patient declined  Copy of Healthcare Power of Attorney in Chart? Yes - validated most recent copy scanned in chart (See row information)  Pre-existing out of facility DNR order (yellow form or pink MOST form) Yellow form placed in chart (order not valid for inpatient use)     Chief Complaint  Patient presents with   Medical Management of Chronic Issues    HPI:  Pt is a 89 y.o. male seen for managing chronic medical conditions.                 Dementia, reported occasional agitation, yelling, combative, refusing medication.              Depression/anxiety, takes Depakote, Lexapro, Seroquel             HTN, on Metoprolol, Bun/creat 19/1.0 11/09/22             Hypothyroidism, taking Levothyroxine, TSH 2.04 11/09/22             Hx of prostate cancer, s/p TURP 2019, L ureter obstruction/stricture, s/p stent  exchange, on Lupron q3 months, followed by Urology             IDA On Fe, Hgb 10/6 01/01/23             Vit B12 deficiency, on supplement             HLD LDL 39 11/09/22, takes Atovastatin.   OA w/c for mobility, pivot to transfer with assistance, takes Tylenol.     Past Medical History:  Diagnosis Date   Complication of anesthesia    "serious cognisance problems since my OR in January" (03/13/2013   Coronary artery disease    Diverticulosis    Hiatal hernia    Hx of echocardiogram    Echo 5/16:  Mild focal basal septal hypertrophy, EF 55%, mild AI, MV repair ok with trivial MR (mean 3 mmHg), mild LAE, mild RAE, PASP 35 mmHg   Hypertension    Hypothyroidism    Irritable bowel syndrome    PAF (paroxysmal atrial fibrillation) (HCC) 05/03/2014   Personal history of colonic polyps    tubular adenoma   Pleural effusion, bilateral 03/12/2013   Small to moderate, L>R   Prostate cancer [facility]) 1991   sees Dr. Gala Lewandowsky at [facility], observation only    S/P CABG x 2 02/28/2013   LIMA  to LAD, SVG to OM1, EVH via right thigh   S/P Maze operation for atrial fibrillation 02/28/2013   Complete bilateral atrial lesion set using bipolar radiofrequency and cryothermy ablation with clipping of LA appendage   S/P mitral valve repair, maze procedure, and CABG x2 02/28/2013   Complex valvuloplasty including triangular resection of posterior leaflet, 30 mm Sorin Memo 3D ring annuloplasty   Past Surgical History:  Procedure Laterality Date   CARDIAC CATHETERIZATION  2015   CATARACT EXTRACTION, BILATERAL Bilateral 2014   per Dr. Nile Riggs    COLONOSCOPY  2011   per Dr. Jarold Motto, benign polyps, no repeats needed    CORONARY ARTERY BYPASS GRAFT N/A 02/28/2013   Procedure: CORONARY ARTERY BYPASS GRAFTING (CABG);  Surgeon: Purcell Nails, MD;  Location: [facility] OR;  Service: Open Heart Surgery;  Laterality: N/A;  CABG x 2,  using left internal mammary artery and right leg saphenous vein harvested endoscopically    ESOPHAGOGASTRODUODENOSCOPY (EGD) WITH PROPOFOL N/A 01/11/2019   Procedure: ESOPHAGOGASTRODUODENOSCOPY (EGD) WITH PROPOFOL;  Surgeon: Sherrilyn Rist, MD;  Location: [facility] ENDOSCOPY;  Service: Gastroenterology;  Laterality: N/A;   HEMORRHOID SURGERY     INTRAOPERATIVE TRANSESOPHAGEAL ECHOCARDIOGRAM N/A 02/28/2013   Procedure: INTRAOPERATIVE TRANSESOPHAGEAL ECHOCARDIOGRAM;  Surgeon: Purcell Nails, MD;  Location: [facility] OR;  Service: Open Heart Surgery;  Laterality: N/A;   LEFT HEART CATHETERIZATION WITH CORONARY ANGIOGRAM N/A 02/26/2013   Procedure: LEFT HEART CATHETERIZATION WITH CORONARY ANGIOGRAM;  Surgeon: Marykay Lex, MD;  Location: [facility] CATH LAB;  Service: Cardiovascular;  Laterality: N/A;   MAZE N/A 02/28/2013   Procedure: MAZE;  Surgeon: Purcell Nails, MD;  Location: [facility] OR;  Service: Open Heart Surgery;  Laterality: N/A;   MITRAL VALVE REPAIR N/A 02/28/2013   Procedure: MITRAL VALVE REPAIR (MVR);  Surgeon: Purcell Nails, MD;  Location: [facility] OR;  Service: Open Heart Surgery;  Laterality: N/A;   PROSTATE BIOPSY     TRANSURETHRAL RESECTION OF PROSTATE N/A 02/04/2017   Procedure: TRANSURETHRAL RESECTION OF THE PROSTATE (TURP);  Surgeon: Sebastian Ache, MD;  Location: [facility] ORS;  Service: Urology;  Laterality: N/A;    Allergies  Allergen Reactions   Other Other (See Comments)    ANESTHESIA.... 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 ANESTHESIA.... 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 Occurred post op CABG 2015, required readmission and rehab    Outpatient Encounter Medications as of 02/08/2023  Medication Sig   acetaminophen (TYLENOL) 500 MG tablet Take 1,000 mg by mouth in the morning and at bedtime.   Cholecalciferol (VITAMIN D3) 50 MCG (2000 UT) TABS Take 50 mcg by mouth daily.   acetaminophen (TYLENOL) 325 MG tablet Take 650 mg by mouth every 4 (four) hours as needed. (Patient not taking: Reported on 02/08/2023)   acetaminophen (TYLENOL) 500 MG tablet  Take 2 tablets (1,000 mg total) by mouth 2 (two) times daily as needed. (Patient not taking: Reported on 02/08/2023)   atorvastatin (LIPITOR) 10 MG tablet TAKE 1 TABLET EVERY DAY AT 6PM (Patient not taking: Reported on 02/08/2023)   Cholecalciferol (VITAMIN D3 PO) Take 2,000 Units by mouth daily. (Patient not taking: Reported on 02/08/2023)   Cyanocobalamin 2500 MCG SUBL Place 1 tablet under the tongue daily. (Patient not taking: Reported on 02/08/2023)   divalproex (DEPAKOTE) 125 MG DR tablet Take 250 mg by mouth at bedtime. (Patient not taking: Reported on 02/08/2023)   escitalopram (LEXAPRO) 5 MG tablet Take 5 mg by mouth daily. (Patient not taking:  Reported on 02/08/2023)   ferrous sulfate 325 (65 FE) MG EC tablet TAKE 1 TABLET BY MOUTH EVERY DAY (Patient not taking: Reported on 02/08/2023)   fish oil-omega-3 fatty acids 1000 MG capsule Take 1 g by mouth every morning. (Patient not taking: Reported on 02/08/2023)   levothyroxine (SYNTHROID) 50 MCG tablet TAKE 1 TABLET BY MOUTH IN THE MORNING (Patient not taking: Reported on 02/08/2023)   LORazepam (ATIVAN) 0.5 MG tablet Take 0.5 mg by mouth every 12 (twelve) hours as needed for anxiety. (Patient not taking: Reported on 02/08/2023)   METOPROLOL TARTRATE PO Take 12.5 mg by mouth in the morning and at bedtime. (Patient not taking: Reported on 02/08/2023)   nitroGLYCERIN (NITROSTAT) 0.4 MG SL tablet Place 1 tablet (0.4 mg total) under the tongue every 5 (five) minutes as needed for chest pain. (Patient not taking: Reported on 02/08/2023)   QUEtiapine (SEROQUEL) 25 MG tablet Take 1 tablet (25 mg total) by mouth daily in the afternoon. Give at 2 pm (Patient not taking: Reported on 02/08/2023)   simethicone (MYLICON) 80 MG chewable tablet Chew 80 mg by mouth every 12 (twelve) hours as needed for flatulence. (Patient not taking: Reported on 02/08/2023)   No facility-administered encounter medications on file as of 02/08/2023.    Review of Systems   Unable to perform ROS: Dementia    Immunization History  Administered Date(s) Administered   Fluad Quad(high Dose 65+) 11/21/2018, 12/25/2019, 01/02/2021, 12/16/2021, 11/24/2022   Influenza Split 12/15/2006, 12/06/2007, 12/31/2009, 11/01/2011   Influenza Whole 12/15/2006, 12/06/2007, 12/31/2009   Influenza, High Dose Seasonal PF 11/11/2014, 11/12/2015, 11/15/2016, 11/18/2017, 11/21/2018, 01/02/2021   Influenza,inj,Quad PF,6+ Mos 11/01/2012, 11/09/2013   Influenza-Unspecified 11/01/2011, 11/01/2012, 11/09/2013   Moderna Covid-19 Vaccine Bivalent Booster 22yrs & up 12/08/2022   Moderna SARS-COV2 Booster Vaccination 01/07/2020   Moderna Sars-Covid-2 Vaccination 02/26/2019, 03/26/2019   PFIZER(Purple Top)SARS-COV-2 Vaccination 12/29/2021   Pneumococcal Conjugate-13 11/09/2013   Pneumococcal Polysaccharide-23 02/23/2004   Td 02/22/2002   Td (Adult),5 Lf Tetanus Toxid, Preservative Free 02/22/2002   Tdap 01/22/2014   Pertinent  Health Maintenance Due  Topic Date Due   INFLUENZA VACCINE  Completed   Colonoscopy  Discontinued      01/25/2022    1:55 PM 02/05/2022    9:54 AM 05/31/2022    3:57 PM 08/11/2022    2:32 PM 12/29/2022    1:38 PM  Fall Risk  Falls in the past year? 1 1 1  0 1  Was there an injury with Fall? 0 0 0 0 0  Fall Risk Category Calculator 2 2 2  0 2  Fall Risk Category (Retired) Moderate Moderate     (RETIRED) Patient Fall Risk Level Moderate fall risk Moderate fall risk     Patient at Risk for Falls Due to History of fall(s);Impaired balance/gait;Impaired mobility History of fall(s);Impaired balance/gait;Impaired mobility History of fall(s);Impaired balance/gait;Impaired mobility;Mental status change History of fall(s);Impaired balance/gait;Impaired mobility;Mental status change History of fall(s);Impaired balance/gait;Impaired mobility  Fall risk Follow up Falls evaluation completed Falls evaluation completed Falls evaluation completed;Education provided;Falls  prevention discussed Falls evaluation completed;Education provided;Falls prevention discussed Falls evaluation completed;Education provided;Falls prevention discussed   Functional Status Survey:    Vitals:   02/08/23 1058  BP: 106/85  Pulse: 62  Resp: 16  Temp: (!) 97.4 F (36.3 C)  SpO2: 97%  Weight: 138 lb 8 oz (62.8 kg)  Height: 5\' 9"  (1.753 m)   Body mass index is 20.45 kg/m. Physical Exam Vitals and nursing note reviewed.  Constitutional:  Appearance: Normal appearance.  HENT:     Head: Normocephalic and atraumatic.     Nose: Nose normal.     Mouth/Throat:     Mouth: Mucous membranes are moist.  Eyes:     Extraocular Movements: Extraocular movements intact.     Conjunctiva/sclera: Conjunctivae normal.     Pupils: Pupils are equal, round, and reactive to light.  Cardiovascular:     Rate and Rhythm: Normal rate and regular rhythm.     Heart sounds: No murmur heard. Pulmonary:     Effort: Pulmonary effort is normal.     Breath sounds: No rales.  Abdominal:     General: Bowel sounds are normal. There is no distension.     Palpations: Abdomen is soft.     Tenderness: There is no abdominal tenderness. There is no right CVA tenderness, left CVA tenderness, guarding or rebound.  Musculoskeletal:     Cervical back: Normal range of motion and neck supple.     Right lower leg: No edema.     Left lower leg: No edema.  Skin:    General: Skin is warm and dry.  Neurological:     General: No focal deficit present.     Mental Status: He is alert.     Motor: No weakness.     Gait: Gait abnormal.     Comments: Oriented to self.   Psychiatric:        Mood and Affect: Mood normal.     Labs reviewed: Recent Labs    07/26/22 0000 09/16/22 0000 11/09/22 0000  NA 137 138 140  K 4.0 4.0 4.2  CL 106 104 105  CO2 26* 29* 27*  BUN 16 20 19   CREATININE 1.0 1.1 1.0  CALCIUM 8.7 8.5* 8.6*   Recent Labs    07/26/22 0000 09/16/22 0000 11/09/22 0000  AST 14 12* 19   ALT 9* 10 18  ALKPHOS 55 52 58  ALBUMIN 3.1* 2.9* 3.2*   Recent Labs    07/26/22 0000 11/09/22 0000 01/01/23 0000  WBC 4.3 4.4 3.9  NEUTROABS 2,877.00  --  2,266.00  HGB 11.6* 11.7* 10.6*  HCT 35* 36* 33*  PLT 145* 158 187   Lab Results  Component Value Date   TSH 2.04 11/09/2022   Lab Results  Component Value Date   HGBA1C 5.6 07/01/2016   Lab Results  Component Value Date   CHOL 103 11/09/2022   HDL 46 11/09/2022   LDLCALC 39 11/09/2022   LDLDIRECT 159.3 05/17/2007   TRIG 99 11/09/2022   CHOLHDL 2 01/28/2021    Significant Diagnostic Results in last 30 days:  No results found.  Assessment/Plan Hypothyroidism  taking Levothyroxine, TSH 2.04 11/09/22  Ureteral stricture, left Hx of prostate cancer, s/p TURP 2019, L ureter obstruction/stricture, s/p stent exchange, on Lupron q3 months, followed by Urology  Anemia due to multiple mechanisms On Fe, Hgb 10/6 01/01/23  Vitamin B12 deficiency on supplement  HLD (hyperlipidemia)  LDL 39 11/09/22, takes Atovastatin.   Osteoarthritis, generalized  w/c for mobility, pivot to transfer with assistance, takes Tylenol.   Essential hypertension Blood pressure runs low, dc Metoprolol, VS/P daily if HPOA consents. Bp/P daily.   Dementia, senile with depression, with behavioral disturbance (HCC)  takes Depakote, Lexapro, Seroquel, reported occasional agitation, yelling, combative, refusing medication.      Family/ staff Communication: plan of care reviewed with the patient and charge nurse.   Labs/tests ordered:  none  Time spend 30 minutes.

## 2023-02-08 NOTE — Assessment & Plan Note (Signed)
w/c for mobility, pivot to transfer with assistance, takes Tylenol.

## 2023-02-08 NOTE — Assessment & Plan Note (Signed)
 LDL 39 11/09/22, takes Atovastatin.

## 2023-02-08 NOTE — Assessment & Plan Note (Signed)
on supplement.

## 2023-02-08 NOTE — Assessment & Plan Note (Signed)
takes Depakote, Lexapro, Seroquel, reported occasional agitation, yelling, combative, refusing medication.

## 2023-02-08 NOTE — Assessment & Plan Note (Signed)
On Fe, Hgb 10/6 01/01/23

## 2023-02-08 NOTE — Assessment & Plan Note (Signed)
Blood pressure runs low, dc Metoprolol, VS/P daily if HPOA consents. Bp/P daily.

## 2023-02-08 NOTE — Assessment & Plan Note (Signed)
taking Levothyroxine, TSH 2.04 11/09/22

## 2023-02-08 NOTE — Assessment & Plan Note (Signed)
Hx of prostate cancer, s/p TURP 2019, L ureter obstruction/stricture, s/p stent exchange, on Lupron q3 months, followed by Urology

## 2023-03-08 DIAGNOSIS — B351 Tinea unguium: Secondary | ICD-10-CM | POA: Diagnosis not present

## 2023-03-08 DIAGNOSIS — M79671 Pain in right foot: Secondary | ICD-10-CM | POA: Diagnosis not present

## 2023-03-08 DIAGNOSIS — M79672 Pain in left foot: Secondary | ICD-10-CM | POA: Diagnosis not present

## 2023-03-15 ENCOUNTER — Encounter: Payer: Self-pay | Admitting: Nurse Practitioner

## 2023-03-15 ENCOUNTER — Non-Acute Institutional Stay (SKILLED_NURSING_FACILITY): Payer: Medicare HMO | Admitting: Nurse Practitioner

## 2023-03-15 DIAGNOSIS — M159 Polyosteoarthritis, unspecified: Secondary | ICD-10-CM

## 2023-03-15 DIAGNOSIS — E538 Deficiency of other specified B group vitamins: Secondary | ICD-10-CM

## 2023-03-15 DIAGNOSIS — E039 Hypothyroidism, unspecified: Secondary | ICD-10-CM

## 2023-03-15 DIAGNOSIS — E782 Mixed hyperlipidemia: Secondary | ICD-10-CM

## 2023-03-15 DIAGNOSIS — F03918 Unspecified dementia, unspecified severity, with other behavioral disturbance: Secondary | ICD-10-CM

## 2023-03-15 DIAGNOSIS — D62 Acute posthemorrhagic anemia: Secondary | ICD-10-CM | POA: Diagnosis not present

## 2023-03-15 DIAGNOSIS — Z66 Do not resuscitate: Secondary | ICD-10-CM

## 2023-03-15 DIAGNOSIS — I10 Essential (primary) hypertension: Secondary | ICD-10-CM

## 2023-03-15 DIAGNOSIS — N135 Crossing vessel and stricture of ureter without hydronephrosis: Secondary | ICD-10-CM

## 2023-03-15 DIAGNOSIS — F0393 Unspecified dementia, unspecified severity, with mood disturbance: Secondary | ICD-10-CM | POA: Diagnosis not present

## 2023-03-15 DIAGNOSIS — C61 Malignant neoplasm of prostate: Secondary | ICD-10-CM | POA: Diagnosis not present

## 2023-03-15 NOTE — Assessment & Plan Note (Signed)
 w/c for mobility, pivot to transfer with assistance, takes Tylenol.

## 2023-03-15 NOTE — Assessment & Plan Note (Signed)
on supplement.

## 2023-03-15 NOTE — Progress Notes (Unsigned)
Location:  [facility] Room Number: N016-A Place of Service:  SNF (31) Provider:  [facility] Sandie Swayze, N.P.   Patient Care Team: Venita Sheffield, MD as PCP - General (Internal Medicine) Verner Chol, [facility] (Inactive) as Pharmacist (Pharmacist) Mahlon Gammon, MD as Consulting Physician (Internal Medicine)  Extended Emergency Contact Information Primary Emergency Contact: Depaula,Gerry Address: [address]          [place] [address]          [place], [place] [address] Darden Amber of [place] Home Phone: [phone] Relation: Spouse Secondary Emergency Contact: Danh,Larry Address: [address]          [place], [place] [address] Darden [place] of [place] Mobile Phone: [phone] Relation: Son  Code Status:  DNR Goals of care: Advanced Directive information    02/08/2023   11:10 AM  Advanced Directives  Does Patient Have a Medical Advance Directive? Yes  Type of Estate agent of [place];Living will;Out of facility DNR (pink MOST or yellow form)  Does patient want to make changes to medical advance directive? No - Patient declined  Copy of Healthcare Power of Attorney in Chart? Yes - validated most recent copy scanned in chart (See row information)  Pre-existing out of facility DNR order (yellow form or pink MOST form) Yellow form placed in chart (order not valid for inpatient use)     Chief Complaint  Patient presents with  . Medical Management of Chronic Issues    Routine visit.     HPI:  Pt is a 89 y.o. male seen today for medical management of chronic diseases.     Dementia, reported occasional agitation, yelling, combative, refusing medication.              Depression/anxiety, takes Depakote, Lexapro, Seroquel             HTN, on Metoprolol, Bun/creat 19/1.0 11/09/22             Hypothyroidism, taking Levothyroxine, TSH 2.04 11/09/22             Hx of prostate cancer, s/p TURP 2019, L ureter obstruction/stricture, s/p stent  exchange, on Lupron q3 months, followed by Urology             IDA On Fe, Hgb 10/6 01/01/23             Vit B12 deficiency, on supplement             HLD LDL 39 11/09/22, takes Atovastatin.              OA w/c for mobility, pivot to transfer with assistance, takes Tylenol.        Past Medical History:  Diagnosis Date  . Complication of anesthesia    "serious cognisance problems since my OR in January" (03/13/2013  . Coronary artery disease   . Diverticulosis   . Hiatal hernia   . Hx of echocardiogram    Echo 5/16:  Mild focal basal septal hypertrophy, EF 55%, mild AI, MV repair ok with trivial MR (mean 3 mmHg), mild LAE, mild RAE, PASP 35 mmHg  . Hypertension   . Hypothyroidism   . Irritable bowel syndrome   . PAF (paroxysmal atrial fibrillation) (HCC) 05/03/2014  . Personal history of colonic polyps    tubular adenoma  . Pleural effusion, bilateral 03/12/2013   Small to moderate, L>R  . Prostate cancer [facility]) 1991   sees Dr. Gala Lewandowsky at [facility], observation only   . S/P  CABG x 2 02/28/2013   LIMA to LAD, SVG to OM1, EVH via right thigh  . S/P Maze operation for atrial fibrillation 02/28/2013   Complete bilateral atrial lesion set using bipolar radiofrequency and cryothermy ablation with clipping of LA appendage  . S/P mitral valve repair, maze procedure, and CABG x2 02/28/2013   Complex valvuloplasty including triangular resection of posterior leaflet, 30 mm Sorin Memo 3D ring annuloplasty   Past Surgical History:  Procedure Laterality Date  . CARDIAC CATHETERIZATION  2015  . CATARACT EXTRACTION, BILATERAL Bilateral 2014   per Dr. Nile Riggs   . COLONOSCOPY  2011   per Dr. Jarold Motto, benign polyps, no repeats needed   . CORONARY ARTERY BYPASS GRAFT N/A 02/28/2013   Procedure: CORONARY ARTERY BYPASS GRAFTING (CABG);  Surgeon: Purcell Nails, MD;  Location: [facility] OR;  Service: Open Heart Surgery;  Laterality: N/A;  CABG x 2,  using left internal mammary artery and right leg saphenous vein  harvested endoscopically  . ESOPHAGOGASTRODUODENOSCOPY (EGD) WITH PROPOFOL N/A 01/11/2019   Procedure: ESOPHAGOGASTRODUODENOSCOPY (EGD) WITH PROPOFOL;  Surgeon: Sherrilyn Rist, MD;  Location: [facility] ENDOSCOPY;  Service: Gastroenterology;  Laterality: N/A;  . HEMORRHOID SURGERY    . INTRAOPERATIVE TRANSESOPHAGEAL ECHOCARDIOGRAM N/A 02/28/2013   Procedure: INTRAOPERATIVE TRANSESOPHAGEAL ECHOCARDIOGRAM;  Surgeon: Purcell Nails, MD;  Location: [facility] OR;  Service: Open Heart Surgery;  Laterality: N/A;  . LEFT HEART CATHETERIZATION WITH CORONARY ANGIOGRAM N/A 02/26/2013   Procedure: LEFT HEART CATHETERIZATION WITH CORONARY ANGIOGRAM;  Surgeon: Marykay Lex, MD;  Location: [facility] CATH LAB;  Service: Cardiovascular;  Laterality: N/A;  . MAZE N/A 02/28/2013   Procedure: MAZE;  Surgeon: Purcell Nails, MD;  Location: [facility] OR;  Service: Open Heart Surgery;  Laterality: N/A;  . MITRAL VALVE REPAIR N/A 02/28/2013   Procedure: MITRAL VALVE REPAIR (MVR);  Surgeon: Purcell Nails, MD;  Location: [facility] OR;  Service: Open Heart Surgery;  Laterality: N/A;  . PROSTATE BIOPSY    . TRANSURETHRAL RESECTION OF PROSTATE N/A 02/04/2017   Procedure: TRANSURETHRAL RESECTION OF THE PROSTATE (TURP);  Surgeon: Sebastian Ache, MD;  Location: [facility] ORS;  Service: Urology;  Laterality: N/A;    Allergies  Allergen Reactions  . Other Other (See Comments)    ANESTHESIA.... 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 ANESTHESIA.... 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 Occurred post op CABG 2015, required readmission and rehab    Outpatient Encounter Medications as of 03/15/2023  Medication Sig  . acetaminophen (TYLENOL) 325 MG tablet Take 650 mg by mouth every 4 (four) hours as needed.  [name] acetaminophen (TYLENOL) 500 MG tablet Take 2 tablets (1,000 mg total) by mouth 2 (two) times daily as needed.  [name] atorvastatin (LIPITOR) 10 MG tablet TAKE 1 TABLET EVERY DAY AT 6PM  . Cholecalciferol (VITAMIN D3) 50 MCG  (2000 UT) TABS Take 50 mcg by mouth daily.  . Cyanocobalamin 2500 MCG SUBL Place 1 tablet under the tongue daily.  . divalproex (DEPAKOTE) 125 MG DR tablet Take 125 mg by mouth at bedtime.  [name] escitalopram (LEXAPRO) 20 MG tablet Take 20 mg by mouth daily.  . ferrous sulfate 325 (65 FE) MG EC tablet TAKE 1 TABLET BY MOUTH EVERY DAY  . fish oil-omega-3 fatty acids 1000 MG capsule Take 1 g by mouth every morning.  . hydrocortisone cream 1 % Apply 1 Application topically 2 (two) times daily. For rash on right cheek  . lactose free nutrition (BOOST) LIQD Take 237 mLs  by mouth as needed (for supplement).  [name] levothyroxine (SYNTHROID) 50 MCG tablet TAKE 1 TABLET BY MOUTH IN THE MORNING  . nitroGLYCERIN (NITROSTAT) 0.4 MG SL tablet Place 1 tablet (0.4 mg total) under the tongue every 5 (five) minutes as needed for chest pain.  [name] QUEtiapine (SEROQUEL) 25 MG tablet Take 1 tablet (25 mg total) by mouth daily in the afternoon. Give at 2 pm  . simethicone (MYLICON) 80 MG chewable tablet Chew 80 mg by mouth every 12 (twelve) hours as needed for flatulence.  [name] acetaminophen (TYLENOL) 500 MG tablet Take 1,000 mg by mouth in the morning and at bedtime. (Patient not taking: Reported on 03/15/2023)  . Cholecalciferol (VITAMIN D3 PO) Take 2,000 Units by mouth daily. (Patient not taking: Reported on 03/15/2023)  . escitalopram (LEXAPRO) 5 MG tablet Take 5 mg by mouth daily. (Patient not taking: Reported on 03/15/2023)  . LORazepam (ATIVAN) 0.5 MG tablet Take 0.5 mg by mouth every 12 (twelve) hours as needed for anxiety. (Patient not taking: Reported on 03/15/2023)  . METOPROLOL TARTRATE PO Take 12.5 mg by mouth in the morning and at bedtime. (Patient not taking: Reported on 03/15/2023)   No facility-administered encounter medications on file as of 03/15/2023.    Review of Systems  Unable to perform ROS: Dementia    Immunization History  Administered Date(s) Administered  . Fluad Quad(high Dose 65+) 11/21/2018,  12/25/2019, 01/02/2021, 12/16/2021, 11/24/2022  . Influenza Split 12/15/2006, 12/06/2007, 12/31/2009, 11/01/2011  . Influenza Whole 12/15/2006, 12/06/2007, 12/31/2009  . Influenza, High Dose Seasonal PF 11/11/2014, 11/12/2015, 11/15/2016, 11/18/2017, 11/21/2018, 01/02/2021  . Influenza,inj,Quad PF,6+ Mos 11/01/2012, 11/09/2013  . Influenza-Unspecified 11/01/2011, 11/01/2012, 11/09/2013  . Moderna Covid-19 Vaccine Bivalent Booster 43yrs & up 12/08/2022  . Moderna SARS-COV2 Booster Vaccination 01/07/2020  . Moderna Sars-Covid-2 Vaccination 02/26/2019, 03/26/2019  . PFIZER(Purple Top)SARS-COV-2 Vaccination 12/29/2021  . Pneumococcal Conjugate-13 11/09/2013  . Pneumococcal Polysaccharide-23 02/23/2004  . Td 02/22/2002  . Td (Adult),5 Lf Tetanus Toxid, Preservative Free 02/22/2002  . Tdap 01/22/2014   Pertinent  Health Maintenance Due  Topic Date Due  . INFLUENZA VACCINE  Completed  . Colonoscopy  Discontinued      01/25/2022    1:55 PM 02/05/2022    9:54 AM 05/31/2022    3:57 PM 08/11/2022    2:32 PM 12/29/2022    1:38 PM  Fall Risk  Falls in the past year? 1 1 1  0 1  Was there an injury with Fall? 0 0 0 0 0  Fall Risk Category Calculator 2 2 2  0 2  Fall Risk Category (Retired) Moderate Moderate     (RETIRED) Patient Fall Risk Level Moderate fall risk Moderate fall risk     Patient at Risk for Falls Due to History of fall(s);Impaired balance/gait;Impaired mobility History of fall(s);Impaired balance/gait;Impaired mobility History of fall(s);Impaired balance/gait;Impaired mobility;Mental status change History of fall(s);Impaired balance/gait;Impaired mobility;Mental status change History of fall(s);Impaired balance/gait;Impaired mobility  Fall risk Follow up Falls evaluation completed Falls evaluation completed Falls evaluation completed;Education provided;Falls prevention discussed Falls evaluation completed;Education provided;Falls prevention discussed Falls evaluation completed;Education  provided;Falls prevention discussed   Functional Status Survey:    Vitals:   03/15/23 1224  BP: 134/78  Pulse: 76  Weight: 137 lb (62.1 kg)  Height: 5\' 9"  (1.753 m)   Body mass index is 20.23 kg/m. Physical Exam Vitals and nursing note reviewed.  Constitutional:      Appearance: Normal appearance.  HENT:     Head: Normocephalic and atraumatic.     Nose:  Nose normal.     Mouth/Throat:     Mouth: Mucous membranes are moist.  Eyes:     Extraocular Movements: Extraocular movements intact.     Conjunctiva/sclera: Conjunctivae normal.     Pupils: Pupils are equal, round, and reactive to light.  Cardiovascular:     Rate and Rhythm: Normal rate and regular rhythm.     Heart sounds: No murmur heard. Pulmonary:     Effort: Pulmonary effort is normal.     Breath sounds: No rales.  Abdominal:     General: Bowel sounds are normal. There is no distension.     Palpations: Abdomen is soft.     Tenderness: There is no abdominal tenderness. There is no right CVA tenderness, left CVA tenderness, guarding or rebound.  Musculoskeletal:     Cervical back: Normal range of motion and neck supple.     Right lower leg: No edema.     Left lower leg: No edema.  Skin:    General: Skin is warm and dry.  Neurological:     General: No focal deficit present.     Mental Status: He is alert.     Motor: No weakness.     Gait: Gait abnormal.     Comments: Oriented to self.   Psychiatric:        Mood and Affect: Mood normal.    Labs reviewed: Recent Labs    07/26/22 0000 09/16/22 0000 11/09/22 0000  NA 137 138 140  K 4.0 4.0 4.2  CL 106 104 105  CO2 26* 29* 27*  BUN 16 20 19   CREATININE 1.0 1.1 1.0  CALCIUM 8.7 8.5* 8.6*   Recent Labs    07/26/22 0000 09/16/22 0000 11/09/22 0000  AST 14 12* 19  ALT 9* 10 18  ALKPHOS 55 52 58  ALBUMIN 3.1* 2.9* 3.2*   Recent Labs    07/26/22 0000 11/09/22 0000 01/01/23 0000  WBC 4.3 4.4 3.9  NEUTROABS 2,877.00  --  2,266.00  HGB 11.6*  11.7* 10.6*  HCT 35* 36* 33*  PLT 145* 158 187   Lab Results  Component Value Date   TSH 2.04 11/09/2022   Lab Results  Component Value Date   HGBA1C 5.6 07/01/2016   Lab Results  Component Value Date   CHOL 103 11/09/2022   HDL 46 11/09/2022   LDLCALC 39 11/09/2022   LDLDIRECT 159.3 05/17/2007   TRIG 99 11/09/2022   CHOLHDL 2 01/28/2021    Significant Diagnostic Results in last 30 days:  No results found.  Assessment/Plan No problem-specific Assessment & Plan notes found for this encounter.      Family/ staff Communication: plan of care reviewed with the patient and charge nurse.   Labs/tests ordered:  none  Time spend 30 minutes.

## 2023-03-15 NOTE — Assessment & Plan Note (Signed)
 LDL 39 11/09/22, takes Atovastatin.

## 2023-03-15 NOTE — Assessment & Plan Note (Signed)
Blood pressure is controlled, on Metoprolol, Bun/creat 19/1.0 11/09/22

## 2023-03-15 NOTE — Assessment & Plan Note (Signed)
 On Fe, Hgb 10/6 01/01/23

## 2023-03-15 NOTE — Assessment & Plan Note (Signed)
 taking Levothyroxine, TSH 2.04 11/09/22

## 2023-03-15 NOTE — Assessment & Plan Note (Signed)
takes Depakote, Lexapro, Seroquel reported occasional agitation, yelling, combative, refusing medication.

## 2023-03-15 NOTE — Assessment & Plan Note (Signed)
 Hx of prostate cancer, s/p TURP 2019, L ureter obstruction/stricture, s/p stent exchange, on Lupron q3 months, followed by Urology

## 2023-03-17 ENCOUNTER — Encounter: Payer: Self-pay | Admitting: Nurse Practitioner

## 2023-03-25 ENCOUNTER — Non-Acute Institutional Stay (SKILLED_NURSING_FACILITY): Payer: Self-pay | Admitting: Sports Medicine

## 2023-03-25 ENCOUNTER — Encounter: Payer: Self-pay | Admitting: Sports Medicine

## 2023-03-25 DIAGNOSIS — E039 Hypothyroidism, unspecified: Secondary | ICD-10-CM

## 2023-03-25 DIAGNOSIS — C61 Malignant neoplasm of prostate: Secondary | ICD-10-CM

## 2023-03-25 DIAGNOSIS — F02818 Dementia in other diseases classified elsewhere, unspecified severity, with other behavioral disturbance: Secondary | ICD-10-CM | POA: Diagnosis not present

## 2023-03-25 DIAGNOSIS — D509 Iron deficiency anemia, unspecified: Secondary | ICD-10-CM | POA: Diagnosis not present

## 2023-03-25 DIAGNOSIS — F411 Generalized anxiety disorder: Secondary | ICD-10-CM

## 2023-03-25 DIAGNOSIS — G309 Alzheimer's disease, unspecified: Secondary | ICD-10-CM

## 2023-03-25 DIAGNOSIS — Z66 Do not resuscitate: Secondary | ICD-10-CM

## 2023-03-25 NOTE — Progress Notes (Unsigned)
Location:  [facility] Room Number: N016-A Place of Service:  SNF [phone]) Provider:  Venita Sheffield, MD  Patient Care Team: Venita Sheffield, MD as PCP - General (Internal Medicine) Verner Chol, [facility] (Inactive) as Pharmacist (Pharmacist) Mahlon Gammon, MD as Consulting Physician (Internal Medicine)  Extended Emergency Contact Information Primary Emergency Contact: Mcphearson,Gerry Address: [address]          [place] [address]          [place], [place] [address] [place] Home Phone: [phone] Relation: Spouse Secondary Emergency Contact: Mizuno,Larry Address: [address]          [place], [place] [address] [place] of [place] Mobile Phone: [phone] Relation: Son  Code Status:  DNR Goals of care: Advanced Directive information    03/25/2023   11:10 AM  Advanced Directives  Does Patient Have a Medical Advance Directive? Yes  Type of Estate agent of [place];Living will;Out of facility DNR (pink MOST or yellow form)  Does patient want to make changes to medical advance directive? No - Patient declined  Copy of Healthcare Power of Attorney in Chart? Yes - validated most recent copy scanned in chart (See row information)  Pre-existing out of facility DNR order (yellow form or pink MOST form) Yellow form placed in chart (order not valid for inpatient use)     Chief Complaint  Patient presents with   Medical Management of Chronic Issues    Routine visit     HPI:  Pt is a 89 y.o. male seen today for medical management of chronic diseases.   Pt seen and examined in his room As per nursing staff no new concerns He has intermittent confusion but agitation improved  He needs help with all of his ADLS Ambulates with wheel chair No recent falls  He has h/o prostate cancer and followed with urology recently   As per urology  ''Advanced prostate cancer. Please see note May 20, 2022. Most recent  cystoscopy bilateral retrograde pyelograms left ureteroscopy left ureteral dilation left stent replacement fluoroscopy September 15, 2022. Labs September 02, 2022 creatinine 0.99 PSA undetectable. TURP February 04, 2017 Gleason's 5+5=10 adenocarcinoma the prostate. Completed palliative radiation therapy October 2019. CABG 2015. Lupron started May 31, 2017. PSA CMP sent. Lupron 3 month, Lupron 22.5 mg IM chemotherapy, hormonal antineoplastic, was performed today. Plan cystoscopy bilateral retrograde pyelograms left ureteroscopy left ureteral dilation left stent replacement fluoroscopy in approximately 6 weeks. ''   Past Medical History:  Diagnosis Date   Complication of anesthesia    "serious cognisance problems since my OR in January" (03/13/2013   Coronary artery disease    Diverticulosis    Hiatal hernia    Hx of echocardiogram    Echo 5/16:  Mild focal basal septal hypertrophy, EF 55%, mild AI, MV repair ok with trivial MR (mean 3 mmHg), mild LAE, mild RAE, PASP 35 mmHg   Hypertension    Hypothyroidism    Irritable bowel syndrome    PAF (paroxysmal atrial fibrillation) (HCC) 05/03/2014   Personal history of colonic polyps    tubular adenoma   Pleural effusion, bilateral 03/12/2013   Small to moderate, L>R   Prostate cancer [facility]) 1991   sees Dr. Gala Lewandowsky at [facility], observation only    S/P CABG x 2 02/28/2013   LIMA to LAD, SVG to OM1, EVH via right thigh   S/P Maze operation for atrial fibrillation 02/28/2013   Complete bilateral atrial lesion set using  bipolar radiofrequency and cryothermy ablation with clipping of LA appendage   S/P mitral valve repair, maze procedure, and CABG x2 02/28/2013   Complex valvuloplasty including triangular resection of posterior leaflet, 30 mm Sorin Memo 3D ring annuloplasty   Past Surgical History:  Procedure Laterality Date   CARDIAC CATHETERIZATION  2015   CATARACT EXTRACTION, BILATERAL Bilateral 2014   per Dr. Nile Riggs    COLONOSCOPY  2011   per Dr. Jarold Motto,  benign polyps, no repeats needed    CORONARY ARTERY BYPASS GRAFT N/A 02/28/2013   Procedure: CORONARY ARTERY BYPASS GRAFTING (CABG);  Surgeon: Purcell Nails, MD;  Location: [facility] OR;  Service: Open Heart Surgery;  Laterality: N/A;  CABG x 2,  using left internal mammary artery and right leg saphenous vein harvested endoscopically   ESOPHAGOGASTRODUODENOSCOPY (EGD) WITH PROPOFOL N/A 01/11/2019   Procedure: ESOPHAGOGASTRODUODENOSCOPY (EGD) WITH PROPOFOL;  Surgeon: Sherrilyn Rist, MD;  Location: [facility] ENDOSCOPY;  Service: Gastroenterology;  Laterality: N/A;   HEMORRHOID SURGERY     INTRAOPERATIVE TRANSESOPHAGEAL ECHOCARDIOGRAM N/A 02/28/2013   Procedure: INTRAOPERATIVE TRANSESOPHAGEAL ECHOCARDIOGRAM;  Surgeon: Purcell Nails, MD;  Location: [facility] OR;  Service: Open Heart Surgery;  Laterality: N/A;   LEFT HEART CATHETERIZATION WITH CORONARY ANGIOGRAM N/A 02/26/2013   Procedure: LEFT HEART CATHETERIZATION WITH CORONARY ANGIOGRAM;  Surgeon: Marykay Lex, MD;  Location: [facility] CATH LAB;  Service: Cardiovascular;  Laterality: N/A;   MAZE N/A 02/28/2013   Procedure: MAZE;  Surgeon: Purcell Nails, MD;  Location: [facility] OR;  Service: Open Heart Surgery;  Laterality: N/A;   MITRAL VALVE REPAIR N/A 02/28/2013   Procedure: MITRAL VALVE REPAIR (MVR);  Surgeon: Purcell Nails, MD;  Location: [facility] OR;  Service: Open Heart Surgery;  Laterality: N/A;   PROSTATE BIOPSY     TRANSURETHRAL RESECTION OF PROSTATE N/A 02/04/2017   Procedure: TRANSURETHRAL RESECTION OF THE PROSTATE (TURP);  Surgeon: Sebastian Ache, MD;  Location: [facility] ORS;  Service: Urology;  Laterality: N/A;    Allergies  Allergen Reactions   Other Other (See Comments)    ANESTHESIA.... 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 ANESTHESIA.... 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 Occurred post op CABG 2015, required readmission and rehab    Outpatient Encounter Medications as of 03/25/2023  Medication Sig   acetaminophen (TYLENOL)  325 MG tablet Take 650 mg by mouth every 4 (four) hours as needed.   acetaminophen (TYLENOL) 500 MG tablet Take 2 tablets (1,000 mg total) by mouth 2 (two) times daily as needed.   atorvastatin (LIPITOR) 10 MG tablet TAKE 1 TABLET EVERY DAY AT 6PM   Cholecalciferol (VITAMIN D3) 50 MCG (2000 UT) TABS Take 50 mcg by mouth daily.   Cyanocobalamin 2500 MCG SUBL Place 1 tablet under the tongue daily.   divalproex (DEPAKOTE) 125 MG DR tablet Take 125 mg by mouth at bedtime.   escitalopram (LEXAPRO) 20 MG tablet Take 20 mg by mouth daily.   ferrous sulfate 325 (65 FE) MG EC tablet TAKE 1 TABLET BY MOUTH EVERY DAY   fish oil-omega-3 fatty acids 1000 MG capsule Take 1 g by mouth every morning.   hydrocortisone cream 1 % Apply 1 Application topically as needed. For rash on right cheek   lactose free nutrition (BOOST) LIQD Take 237 mLs by mouth as needed (for supplement).   levothyroxine (SYNTHROID) 50 MCG tablet TAKE 1 TABLET BY MOUTH IN THE MORNING   nitroGLYCERIN (NITROSTAT) 0.4 MG SL tablet Place 1 tablet (0.4 mg total)  under the tongue every 5 (five) minutes as needed for chest pain.   QUEtiapine (SEROQUEL) 25 MG tablet Take 1 tablet (25 mg total) by mouth daily in the afternoon. Give at 2 pm   simethicone (MYLICON) 80 MG chewable tablet Chew 80 mg by mouth every 12 (twelve) hours as needed for flatulence.   sodium phosphate Pediatric (FLEET) 3.5-9.5 GM/59ML enema Place 1 enema rectally daily.   acetaminophen (TYLENOL) 500 MG tablet Take 1,000 mg by mouth in the morning and at bedtime. (Patient not taking: Reported on 03/15/2023)   Cholecalciferol (VITAMIN D3 PO) Take 2,000 Units by mouth daily. (Patient not taking: Reported on 03/15/2023)   escitalopram (LEXAPRO) 5 MG tablet Take 5 mg by mouth daily. (Patient not taking: Reported on 02/08/2023)   LORazepam (ATIVAN) 0.5 MG tablet Take 0.5 mg by mouth every 12 (twelve) hours as needed for anxiety. (Patient not taking: Reported on 02/08/2023)   METOPROLOL  TARTRATE PO Take 12.5 mg by mouth in the morning and at bedtime. (Patient not taking: Reported on 02/08/2023)   No facility-administered encounter medications on file as of 03/25/2023.    Review of Systems  Unable to perform ROS: Dementia  Constitutional:  Negative for fever.  Respiratory:  Negative for cough and shortness of breath.   Cardiovascular:  Negative for leg swelling.  Gastrointestinal:  Negative for blood in stool and diarrhea.  Genitourinary:  Negative for dysuria.    Immunization History  Administered Date(s) Administered   Fluad Quad(high Dose 65+) 11/21/2018, 12/25/2019, 01/02/2021, 12/16/2021, 11/24/2022   Influenza Split 12/15/2006, 12/06/2007, 12/31/2009, 11/01/2011   Influenza Whole 12/15/2006, 12/06/2007, 12/31/2009   Influenza, High Dose Seasonal PF 11/11/2014, 11/12/2015, 11/15/2016, 11/18/2017, 11/21/2018, 01/02/2021   Influenza,inj,Quad PF,6+ Mos 11/01/2012, 11/09/2013   Influenza-Unspecified 11/01/2011, 11/01/2012, 11/09/2013   Moderna Covid-19 Vaccine Bivalent Booster 66yrs & up 12/08/2022   Moderna SARS-COV2 Booster Vaccination 01/07/2020   Moderna Sars-Covid-2 Vaccination 02/26/2019, 03/26/2019   PFIZER(Purple Top)SARS-COV-2 Vaccination 12/29/2021   Pneumococcal Conjugate-13 11/09/2013   Pneumococcal Polysaccharide-23 02/23/2004   Td 02/22/2002   Td (Adult),5 Lf Tetanus Toxid, Preservative Free 02/22/2002   Tdap 01/22/2014   Pertinent  Health Maintenance Due  Topic Date Due   INFLUENZA VACCINE  Completed   Colonoscopy  Discontinued      01/25/2022    1:55 PM 02/05/2022    9:54 AM 05/31/2022    3:57 PM 08/11/2022    2:32 PM 12/29/2022    1:38 PM  Fall Risk  Falls in the past year? 1 1 1  0 1  Was there an injury with Fall? 0 0 0 0 0  Fall Risk Category Calculator 2 2 2  0 2  Fall Risk Category (Retired) Moderate Moderate     (RETIRED) Patient Fall Risk Level Moderate fall risk Moderate fall risk     Patient at Risk for Falls Due to History of  fall(s);Impaired balance/gait;Impaired mobility History of fall(s);Impaired balance/gait;Impaired mobility History of fall(s);Impaired balance/gait;Impaired mobility;Mental status change History of fall(s);Impaired balance/gait;Impaired mobility;Mental status change History of fall(s);Impaired balance/gait;Impaired mobility  Fall risk Follow up Falls evaluation completed Falls evaluation completed Falls evaluation completed;Education provided;Falls prevention discussed Falls evaluation completed;Education provided;Falls prevention discussed Falls evaluation completed;Education provided;Falls prevention discussed   Functional Status Survey:    Vitals:   03/25/23 1109  BP: 114/72  Pulse: 71  Temp: (!) 97.1 F (36.2 C)  SpO2: 98%  Weight: 137 lb 11.2 oz (62.5 kg)  Height: 5\' 9"  (1.753 m)   Body mass index is 20.33 kg/m. Physical  Exam Constitutional:      Appearance: Normal appearance.  HENT:     Head: Normocephalic and atraumatic.  Cardiovascular:     Rate and Rhythm: Normal rate and regular rhythm.     Pulses: Normal pulses.     Heart sounds: Normal heart sounds.  Pulmonary:     Effort: No respiratory distress.     Breath sounds: No wheezing or rales.  Abdominal:     General: Bowel sounds are normal. There is no distension.     Palpations: Abdomen is soft.     Tenderness: There is no abdominal tenderness. There is no guarding.  Musculoskeletal:        General: No swelling.  Neurological:     Mental Status: He is alert. Mental status is at baseline.     Comments: Able to elevate his legs off the bed      Labs reviewed: Recent Labs    07/26/22 0000 09/16/22 0000 11/09/22 0000  NA 137 138 140  K 4.0 4.0 4.2  CL 106 104 105  CO2 26* 29* 27*  BUN 16 20 19   CREATININE 1.0 1.1 1.0  CALCIUM 8.7 8.5* 8.6*   Recent Labs    07/26/22 0000 09/16/22 0000 11/09/22 0000  AST 14 12* 19  ALT 9* 10 18  ALKPHOS 55 52 58  ALBUMIN 3.1* 2.9* 3.2*   Recent Labs     07/26/22 0000 11/09/22 0000 01/01/23 0000  WBC 4.3 4.4 3.9  NEUTROABS 2,877.00  --  2,266.00  HGB 11.6* 11.7* 10.6*  HCT 35* 36* 33*  PLT 145* 158 187   Lab Results  Component Value Date   TSH 2.04 11/09/2022   Lab Results  Component Value Date   HGBA1C 5.6 07/01/2016   Lab Results  Component Value Date   CHOL 103 11/09/2022   HDL 46 11/09/2022   LDLCALC 39 11/09/2022   LDLDIRECT 159.3 05/17/2007   TRIG 99 11/09/2022   CHOLHDL 2 01/28/2021    Significant Diagnostic Results in last 30 days:  No results found.  Assessment/Plan  1. Major neurocognitive disorder due to Alzheimer disease, with behavioral disturbance (HCC) (Primary) Cont with assistance with his ADLS Increase cognitively engaging activities Wife reports that he is not able to recognize her at times He wheels himself to her room and then cannot recognize her  Functional Assessment Staging Scale: 7a - Ability to speak limited to approximately a half-dozen different intelligible words or fewer in an average day or the course of an intensive interview.   2. GAD (generalized anxiety disorder) Will d/c ativan  Will cont with lexapro  Will cont with seroquel Will add seroquel 25 mg prn for agitation  3. Acquired hypothyroidism Will check TSH and adjust dose accordingly  4. Iron deficiency anemia, unspecified iron deficiency anemia type No signs of bleeding Will check cbc, iron studies  Prostate cancer Follow up with urology  Other orders - sodium phosphate Pediatric (FLEET) 3.5-9.5 GM/59ML enema; Place 1 enema rectally daily.    30 min Total time spent for obtaining history,  performing a medically appropriate examination and evaluation, reviewing the tests,ordering  tests,  documenting clinical information in the electronic or other health record,  ,care coordination (not separately reported)

## 2023-03-28 ENCOUNTER — Encounter: Payer: Self-pay | Admitting: Sports Medicine

## 2023-03-29 DIAGNOSIS — E039 Hypothyroidism, unspecified: Secondary | ICD-10-CM | POA: Diagnosis not present

## 2023-03-29 DIAGNOSIS — D519 Vitamin B12 deficiency anemia, unspecified: Secondary | ICD-10-CM | POA: Diagnosis not present

## 2023-03-29 DIAGNOSIS — I10 Essential (primary) hypertension: Secondary | ICD-10-CM | POA: Diagnosis not present

## 2023-03-29 DIAGNOSIS — E559 Vitamin D deficiency, unspecified: Secondary | ICD-10-CM | POA: Diagnosis not present

## 2023-03-29 LAB — COMPREHENSIVE METABOLIC PANEL
Calcium: 8.7 (ref 8.7–10.7)
eGFR: 71

## 2023-03-29 LAB — BASIC METABOLIC PANEL
BUN: 16 (ref 4–21)
CO2: 28 — AB (ref 13–22)
Chloride: 102 (ref 99–108)
Creatinine: 1 (ref 0.6–1.3)
Glucose: 71
Potassium: 4.1 meq/L (ref 3.5–5.1)
Sodium: 138 (ref 137–147)

## 2023-03-29 LAB — VITAMIN B12: Vitamin B-12: >2000

## 2023-03-29 LAB — IRON,TIBC AND FERRITIN PANEL
%SAT: 34
Ferritin: 75
Iron: 67
TIBC: 200

## 2023-03-29 LAB — VITAMIN D 25 HYDROXY (VIT D DEFICIENCY, FRACTURES): Vit D, 25-Hydroxy: 80

## 2023-03-29 LAB — CBC AND DIFFERENTIAL
HCT: 36 — AB (ref 41–53)
Hemoglobin: 11.8 — AB (ref 13.5–17.5)
Platelets: 199 10*3/uL (ref 150–400)
WBC: 4.2

## 2023-03-29 LAB — CBC: RBC: 3.72 — AB (ref 3.87–5.11)

## 2023-03-29 LAB — TSH: TSH: 3.06 (ref 0.41–5.90)

## 2023-04-11 ENCOUNTER — Non-Acute Institutional Stay (SKILLED_NURSING_FACILITY): Payer: Medicare HMO | Admitting: Sports Medicine

## 2023-04-11 DIAGNOSIS — J111 Influenza due to unidentified influenza virus with other respiratory manifestations: Secondary | ICD-10-CM

## 2023-04-11 DIAGNOSIS — S069X9S Unspecified intracranial injury with loss of consciousness of unspecified duration, sequela: Secondary | ICD-10-CM

## 2023-04-11 DIAGNOSIS — F02818 Dementia in other diseases classified elsewhere, unspecified severity, with other behavioral disturbance: Secondary | ICD-10-CM

## 2023-04-11 NOTE — Progress Notes (Unsigned)
Provider:  Dr. Venita Sheffield Location:  [facility] Place of Service:   skilled care [place]   PCP: Venita Sheffield, MD Patient Care Team: Venita Sheffield, MD as PCP - General (Internal Medicine) Verner Chol, [facility] (Inactive) as Pharmacist (Pharmacist) Mahlon Gammon, MD as Consulting Physician (Internal Medicine)  Extended Emergency Contact Information Primary Emergency Contact: Micale,Gerry Address: [address]          [place] [address]          [place], [place] [address] [place] Home Phone: [phone] Relation: Spouse Secondary Emergency Contact: Eichholz,Larry Address: [address]          [place], [place] [address] [place] [employer] Phone: [phone] Relation: Son  Goals of Care: Advanced Directive information    03/25/2023   11:10 AM  Advanced Directives  Does Patient Have a Medical Advance Directive? Yes  Type of Estate agent of [place];Living will;Out of facility DNR (pink MOST or yellow form)  Does patient want to make changes to medical advance directive? No - Patient declined  Copy of Healthcare Power of Attorney in Chart? Yes - validated most recent copy scanned in chart (See row information)  Pre-existing out of facility DNR order (yellow form or pink MOST form) Yellow form placed in chart (order not valid for inpatient use)      History of Present Illness     89 yr old M with h/o dementia is being evaluated for acute visit for flu positive    Pt seen and examined in the living room  As per staff  pt is having runny nose, running low grade fevers. No change in his appetite No cough No new agitation  Pt has h/o dementia, he ambulates with his wheel chair      Past Medical History:  Diagnosis Date   Complication of anesthesia    "serious cognisance problems since my OR in January" (03/13/2013   Coronary artery disease    Diverticulosis    Hiatal hernia    Hx of echocardiogram     Echo 5/16:  Mild focal basal septal hypertrophy, EF 55%, mild AI, MV repair ok with trivial MR (mean 3 mmHg), mild LAE, mild RAE, PASP 35 mmHg   Hypertension    Hypothyroidism    Irritable bowel syndrome    PAF (paroxysmal atrial fibrillation) (HCC) 05/03/2014   Personal history of colonic polyps    tubular adenoma   Pleural effusion, bilateral 03/12/2013   Small to moderate, L>R   Prostate cancer [facility]) 1991   sees Dr. Gala Lewandowsky at [facility], observation only    S/P CABG x 2 02/28/2013   LIMA to LAD, SVG to OM1, EVH via right thigh   S/P Maze operation for atrial fibrillation 02/28/2013   Complete bilateral atrial lesion set using bipolar radiofrequency and cryothermy ablation with clipping of LA appendage   S/P mitral valve repair, maze procedure, and CABG x2 02/28/2013   Complex valvuloplasty including triangular resection of posterior leaflet, 30 mm Sorin Memo 3D ring annuloplasty   Past Surgical History:  Procedure Laterality Date   CARDIAC CATHETERIZATION  2015   CATARACT EXTRACTION, BILATERAL Bilateral 2014   per Dr. Nile Riggs    COLONOSCOPY  2011   per Dr. Jarold Motto, benign polyps, no repeats needed    CORONARY ARTERY BYPASS GRAFT N/A 02/28/2013   Procedure: CORONARY ARTERY BYPASS GRAFTING (CABG);  Surgeon: Purcell Nails, MD;  Location: [facility] OR;  Service: Open Heart Surgery;  Laterality: N/A;  CABG x 2,  using left internal mammary artery and right leg saphenous vein harvested endoscopically   ESOPHAGOGASTRODUODENOSCOPY (EGD) WITH PROPOFOL N/A 01/11/2019   Procedure: ESOPHAGOGASTRODUODENOSCOPY (EGD) WITH PROPOFOL;  Surgeon: Sherrilyn Rist, MD;  Location: [facility] ENDOSCOPY;  Service: Gastroenterology;  Laterality: N/A;   HEMORRHOID SURGERY     INTRAOPERATIVE TRANSESOPHAGEAL ECHOCARDIOGRAM N/A 02/28/2013   Procedure: INTRAOPERATIVE TRANSESOPHAGEAL ECHOCARDIOGRAM;  Surgeon: Purcell Nails, MD;  Location[facility] OR;  Service: Open Heart Surgery;  Laterality: N/A;   LEFT HEART CATHETERIZATION WITH  CORONARY ANGIOGRAM N/A 02/26/2013   Procedure: LEFT HEART CATHETERIZATION WITH CORONARY ANGIOGRAM;  Surgeon: Marykay Lex, MD;  Location: [facility] CATH LAB;  Service: Cardiovascular;  Laterality: N/A;   MAZE N/A 02/28/2013   Procedure: MAZE;  Surgeon: Purcell Nails, MD;  Location[facility] OR;  Service: Open Heart Surgery;  Laterality: N/A;   MITRAL VALVE REPAIR N/A 02/28/2013   Procedure: MITRAL VALVE REPAIR (MVR);  Surgeon: Purcell Nails, MD;  Location: [facility] OR;  Service: Open Heart Surgery;  Laterality: N/A;   PROSTATE BIOPSY     TRANSURETHRAL RESECTION OF PROSTATE N/A 02/04/2017   Procedure: TRANSURETHRAL RESECTION OF THE PROSTATE (TURP);  Surgeon: Sebastian Ache, MD;  Location: [facility] ORS;  Service: Urology;  Laterality: N/A;    reports that he has never smoked. He has never used smokeless tobacco. He reports that he does not drink alcohol and does not use drugs. Social History   Socioeconomic History   Marital status: Married    Spouse name: Alvira Philips   Number of children: 3   Years of education: Not on file   Highest education level: Not on file  Occupational History   Occupation: Retired  Tobacco Use   Smoking status: Never   Smokeless tobacco: Never  Vaping Use   Vaping status: Never Used  Substance and Sexual Activity   Alcohol use: No    Alcohol/week: 0.0 standard drinks of alcohol   Drug use: No   Sexual activity: Not Currently  Other Topics Concern   Not on file  Social History Narrative   Married retired Agricultural consultant   Lives at [employer], independent living though his wife is in a wheelchair and they help each other   1 son lives in [place] a daughter lives in [place]   Never smoker no alcohol or drugs   Social Drivers of Corporate investment banker Strain: Low Risk  (12/29/2022)   Overall Financial Resource Strain (CARDIA)    Difficulty of Paying Living Expenses: Not hard at all  Food Insecurity: No Food Insecurity (12/29/2022)   Hunger  Vital Sign    Worried About Running Out of Food in the Last Year: Never true    Ran Out of Food in the Last Year: Never true  Transportation Needs: No Transportation Needs (12/29/2022)   PRAPARE - Administrator, Civil Service (Medical): No    Lack of Transportation (Non-Medical): No  Physical Activity: Inactive (12/29/2022)   Exercise Vital Sign    Days of Exercise per Week: 0 days    Minutes of Exercise per Session: 0 min  Stress: No Stress Concern Present (12/29/2022)   [employer] of Occupational Health - Occupational Stress Questionnaire    Feeling of Stress : Not at all  Social Connections: Moderately Isolated (12/29/2022)   Social Connection and Isolation Panel [NHANES]    Frequency of Communication with Friends and Family: Twice a week  Frequency of Social Gatherings with Friends and Family: Twice a week    Attends Religious Services: Never    Database administrator or Organizations: No    Attends Banker Meetings: Never    Marital Status: Married  Catering manager Violence: Not At Risk (12/29/2022)   Humiliation, Afraid, Rape, and Kick questionnaire    Fear of Current or Ex-Partner: No    Emotionally Abused: No    Physically Abused: No    Sexually Abused: No    Functional Status Survey:    Family History  Problem Relation Age of Onset   Hypertension Mother    Sudden death Mother    Stroke Mother    Heart disease Mother    Hypertension Father    Sudden death Father    Stroke Father    Heart disease Father    Heart disease Son    Diabetes Son    Lung cancer Brother    Stomach cancer Brother    Cancer Daughter        rebdomyosarcoma   Kidney cancer Brother        mets   Diabetes Sister     Health Maintenance  Topic Date Due   Zoster Vaccines- Shingrix (1 of 2) Never done   COVID-19 Vaccine (5 - 2024-25 season) 11/23/2023 (Originally 02/02/2023)   Medicare Annual Wellness (AWV)  12/29/2023   DTaP/Tdap/Td (4 - Td or Tdap)  01/23/2024   Pneumonia Vaccine 76+ Years old  Completed   INFLUENZA VACCINE  Completed   HPV VACCINES  Aged Out   Colonoscopy  Discontinued    Allergies  Allergen Reactions   Other Other (See Comments)    ANESTHESIA.... 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 ANESTHESIA.... 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 Occurred post op CABG 2015, required readmission and rehab    Outpatient Encounter Medications as of 04/11/2023  Medication Sig   acetaminophen (TYLENOL) 325 MG tablet Take 650 mg by mouth every 4 (four) hours as needed.   acetaminophen (TYLENOL) 500 MG tablet Take 2 tablets (1,000 mg total) by mouth 2 (two) times daily as needed.   acetaminophen (TYLENOL) 500 MG tablet Take 1,000 mg by mouth in the morning and at bedtime. (Patient not taking: Reported on 03/15/2023)   atorvastatin (LIPITOR) 10 MG tablet TAKE 1 TABLET EVERY DAY AT 6PM   Cholecalciferol (VITAMIN D3 PO) Take 2,000 Units by mouth daily. (Patient not taking: Reported on 03/15/2023)   Cholecalciferol (VITAMIN D3) 50 MCG (2000 UT) TABS Take 50 mcg by mouth daily.   Cyanocobalamin 2500 MCG SUBL Place 1 tablet under the tongue daily.   divalproex (DEPAKOTE) 125 MG DR tablet Take 125 mg by mouth at bedtime.   escitalopram (LEXAPRO) 20 MG tablet Take 20 mg by mouth daily.   escitalopram (LEXAPRO) 5 MG tablet Take 5 mg by mouth daily. (Patient not taking: Reported on 02/08/2023)   ferrous sulfate 325 (65 FE) MG EC tablet TAKE 1 TABLET BY MOUTH EVERY DAY   fish oil-omega-3 fatty acids 1000 MG capsule Take 1 g by mouth every morning.   hydrocortisone cream 1 % Apply 1 Application topically as needed. For rash on right cheek   lactose free nutrition (BOOST) LIQD Take 237 mLs by mouth as needed (for supplement).   levothyroxine (SYNTHROID) 50 MCG tablet TAKE 1 TABLET BY MOUTH IN THE MORNING   LORazepam (ATIVAN) 0.5 MG tablet Take 0.5 mg by mouth every 12 (twelve) hours  as needed for anxiety.  (Patient not taking: Reported on 02/08/2023)   METOPROLOL TARTRATE PO Take 12.5 mg by mouth in the morning and at bedtime. (Patient not taking: Reported on 02/08/2023)   nitroGLYCERIN (NITROSTAT) 0.4 MG SL tablet Place 1 tablet (0.4 mg total) under the tongue every 5 (five) minutes as needed for chest pain.   QUEtiapine (SEROQUEL) 25 MG tablet Take 1 tablet (25 mg total) by mouth daily in the afternoon. Give at 2 pm   simethicone (MYLICON) 80 MG chewable tablet Chew 80 mg by mouth every 12 (twelve) hours as needed for flatulence.   sodium phosphate Pediatric (FLEET) 3.5-9.5 GM/59ML enema Place 1 enema rectally daily.   No facility-administered encounter medications on file as of 04/11/2023.    Review of Systems Negative unless indicated in HPI.  There were no vitals filed for this visit. There is no height or weight on file to calculate BMI. BP Readings from Last 3 Encounters:  03/25/23 114/72  03/15/23 134/78  02/08/23 106/85   Wt Readings from Last 3 Encounters:  03/25/23 137 lb 11.2 oz (62.5 kg)  03/15/23 137 lb (62.1 kg)  02/08/23 138 lb 8 oz (62.8 kg)   Physical Exam Constitutional:      Appearance: Normal appearance.  Cardiovascular:     Rate and Rhythm: Normal rate and regular rhythm.     Pulses: Normal pulses.     Heart sounds: Normal heart sounds.  Pulmonary:     Effort: No respiratory distress.     Breath sounds: No stridor. No wheezing or rales.  Abdominal:     General: Bowel sounds are normal. There is no distension.     Palpations: Abdomen is soft.     Tenderness: There is no abdominal tenderness. There is no right CVA tenderness or guarding.  Musculoskeletal:        General: No swelling.  Neurological:     Mental Status: He is alert. Mental status is at baseline.     Labs reviewed: Basic Metabolic Panel: Recent Labs    07/26/22 0000 09/16/22 0000 11/09/22 0000  NA 137 138 140  K 4.0 4.0 4.2  CL 106 104 105  CO2 26* 29* 27*  BUN 16 20 19    CREATININE 1.0 1.1 1.0  CALCIUM 8.7 8.5* 8.6*   Liver Function Tests: Recent Labs    07/26/22 0000 09/16/22 0000 11/09/22 0000  AST 14 12* 19  ALT 9* 10 18  ALKPHOS 55 52 58  ALBUMIN 3.1* 2.9* 3.2*   No results for input(s): "LIPASE", "AMYLASE" in the last 8760 hours. No results for input(s): "AMMONIA" in the last 8760 hours. CBC: Recent Labs    07/26/22 0000 11/09/22 0000 01/01/23 0000  WBC 4.3 4.4 3.9  NEUTROABS 2,877.00  --  2,266.00  HGB 11.6* 11.7* 10.6*  HCT 35* 36* 33*  PLT 145* 158 187   Cardiac Enzymes: No results for input(s): "CKTOTAL", "CKMB", "CKMBINDEX", "TROPONINI" in the last 8760 hours. BNP: Invalid input(s): "POCBNP" Lab Results  Component Value Date   HGBA1C 5.6 07/01/2016   Lab Results  Component Value Date   TSH 2.04 11/09/2022   Lab Results  Component Value Date   VITAMINB12 548 01/03/2020   Lab Results  Component Value Date   FOLATE 41.2 01/10/2019   Lab Results  Component Value Date   IRON 74 05/23/2019   FERRITIN 32.1 05/23/2019    Imaging and Procedures obtained prior to SNF admission: CT ABDOMEN PELVIS WO CONTRAST Result Date: 07/29/2021  CLINICAL DATA:  Prostate cancer, assess treatment response. * Tracking Code: BO * EXAM: CT ABDOMEN AND PELVIS WITHOUT CONTRAST TECHNIQUE: Multidetector CT imaging of the abdomen and pelvis was performed following the standard protocol without IV contrast. RADIATION DOSE REDUCTION: This exam was performed according to the departmental dose-optimization program which includes automated exposure control, adjustment of the mA and/or kV according to patient size and/or use of iterative reconstruction technique. COMPARISON:  Multiple priors including most recent CT abdomen pelvis Jul 07, 2020 FINDINGS: Lower chest: No acute abnormality. Large hiatal hernia/intrathoracic stomach with associated compressive atelectasis in the paramedian lower lobes. Mitral annular calcifications with mitral valve prosthesis.  Prior median sternotomy and CABG. Hepatobiliary: Scattered calcified hepatic granulomata. No suspicious hepatic lesion on this noncontrast examination. Cholelithiasis with calcified gallbladder wall (porcelain gallbladder). No biliary ductal dilation. Pancreas: No pancreatic ductal dilation or evidence of acute inflammation. Spleen: No splenomegaly or focal splenic lesion. Adrenals/Urinary Tract: Bilateral adrenal glands appear normal. Atrophic left kidney with unchanged position of the left double-J ureteral stent with pigtails in the left renal pelvis and urinary bladder. Similar to minimally increased soft tissue stranding about the left renal pelvis and proximal left ureter. Right kidney is unremarkable without hydronephrosis. Urinary bladder is decompressed. Stomach/Bowel: Radiopaque enteric contrast material traverses distal loops of small bowel. Large hiatal hernia/intrathoracic stomach. No pathologic dilation of small or large bowel. Sigmoid colonic diverticulosis without findings of acute diverticulitis. Surgical changes of rectal resection with reanastomosis similar prior. Vascular/Lymphatic: Extensive aortic and branch vessel atherosclerosis without abdominal aortic aneurysm. No pathologically enlarged abdominal or pelvic lymph nodes. Reproductive: Prior prostatectomy. Similar soft tissue thickening about the distal left ureter left mesorectal fascia measuring approximally 4.6 x 1.5 cm, unchanged. Other: Trace pelvic free fluid is similar prior. No walled off fluid collections. Musculoskeletal: Similar L5 superior endplate compression deformity. No aggressive lytic or blastic lesion of bone. IMPRESSION: 1. Post prostatectomy with similar soft tissue thickening about the distal left ureter and left mesorectal fascia/presacral space. 2. Unchanged position of the left double-J ureteral stent with similar to minimally increased stranding about the left renal pelvis and proximal ureter. No discrete  hydronephrosis. 3. No convincing evidence of new or progressive disease in the abdomen or pelvis on this noncontrast CT. 4. Large hiatal hernia/intrathoracic stomach appears similar to multiple priors. 5. Cholelithiasis in a porcelain gallbladder. 6. Colonic diverticulosis without findings of acute diverticulitis. 7.  Aortic Atherosclerosis (ICD10-I70.0). Electronically Signed   By: Maudry Mayhew M.D.   On: 07/29/2021 14:42    Assessment and Plan      1. Influenza (Primary) Pt with flu A  Will start droplet isolation  Will start tamiflu Monitor vitals  Cont with claritin , robitussin prn   2. Major neurocognitive disorder as late effect of traumatic brain injury with behavioral disturbance (HCC) Cont assistance with ADLS Monitor for agitation     30 min Total time spent for obtaining history,  performing a medically appropriate examination and evaluation, reviewing the tests,   documenting clinical information in the electronic or other health record,  ,care coordination (not separately reported)

## 2023-04-14 ENCOUNTER — Encounter: Payer: Self-pay | Admitting: Sports Medicine

## 2023-04-26 ENCOUNTER — Encounter: Payer: Self-pay | Admitting: Nurse Practitioner

## 2023-04-26 ENCOUNTER — Non-Acute Institutional Stay (SKILLED_NURSING_FACILITY): Payer: Self-pay | Admitting: Nurse Practitioner

## 2023-04-26 DIAGNOSIS — N135 Crossing vessel and stricture of ureter without hydronephrosis: Secondary | ICD-10-CM | POA: Diagnosis not present

## 2023-04-26 DIAGNOSIS — F03918 Unspecified dementia, unspecified severity, with other behavioral disturbance: Secondary | ICD-10-CM | POA: Diagnosis not present

## 2023-04-26 DIAGNOSIS — F0393 Unspecified dementia, unspecified severity, with mood disturbance: Secondary | ICD-10-CM

## 2023-04-26 DIAGNOSIS — E782 Mixed hyperlipidemia: Secondary | ICD-10-CM | POA: Diagnosis not present

## 2023-04-26 DIAGNOSIS — D649 Anemia, unspecified: Secondary | ICD-10-CM

## 2023-04-26 DIAGNOSIS — I10 Essential (primary) hypertension: Secondary | ICD-10-CM

## 2023-04-26 DIAGNOSIS — M129 Arthropathy, unspecified: Secondary | ICD-10-CM | POA: Diagnosis not present

## 2023-04-26 DIAGNOSIS — E039 Hypothyroidism, unspecified: Secondary | ICD-10-CM

## 2023-04-26 NOTE — Assessment & Plan Note (Signed)
 LDL 39 11/09/22, takes Atovastatin.

## 2023-04-26 NOTE — Assessment & Plan Note (Signed)
 Hx of prostate cancer, s/p TURP 2019, L ureter obstruction/stricture, s/p stent exchange, on Lupron q3 months, followed by Urology

## 2023-04-26 NOTE — Assessment & Plan Note (Signed)
 On Fe, Hgb 10/6 01/01/23             Vit B12 deficiency, on supplement

## 2023-04-26 NOTE — Assessment & Plan Note (Signed)
 Blood pressure is controlled, off Metoprolol, Bun/creat 19/1.0 11/09/22

## 2023-04-26 NOTE — Progress Notes (Unsigned)
 Location:   SNF [facility] Room Number: N016-A Place of Service:  SNF (31) Provider: [facility] Heron Pitcock NP  Venita Sheffield, MD  Patient Care Team: Venita Sheffield, MD as PCP - General (Internal Medicine) Verner Chol, [facility] (Inactive) as Pharmacist (Pharmacist) Mahlon Gammon, MD as Consulting Physician (Internal Medicine)  Extended Emergency Contact Information Primary Emergency Contact: Markson,Gerry Address: [address]          [place] [address]          [place], [place] [address] D[place] of [place] Home Phone: [phone] Relation: Spouse Secondary Emergency Contact: Gaunce,Larry Address: [address]          [place], [place] [address] [place] of [place] Mobile Phone: [phone] Relation: Son  Code Status:  DNR Goals of care: Advanced Directive information    04/26/2023    3:08 PM  Advanced Directives  Does Patient Have a Medical Advance Directive? Yes  Type of Estate agent of [place];Living will;Out of facility DNR (pink MOST or yellow form)  Does patient want to make changes to medical advance directive? No - Patient declined  Copy of Healthcare Power of Attorney in Chart? Yes - validated most recent copy scanned in chart (See row information)  Pre-existing out of facility DNR order (yellow form or pink MOST form) Yellow form placed in chart (order not valid for inpatient use)     Chief Complaint  Patient presents with   Medical Management of Chronic Issues    Routine Visit    HPI:  Pt is a 89 y.o. male seen today for medical management of chronic diseases.    Dementia, reported occasional agitation, yelling, combative, refusing medication, better since Depakote, Seroquel              Depression/anxiety, takes Depakote, Lexapro, Seroquel             HTN, off Metoprolol, Bun/creat 19/1.0 11/09/22             Hypothyroidism, taking Levothyroxine, TSH 2.04 11/09/22             Hx of prostate cancer, s/p TURP 2019, L ureter  obstruction/stricture, s/p stent exchange, on Lupron q3 months, followed by Urology             IDA On Fe, Hgb 10/6 01/01/23             Vit B12 deficiency, on supplement             HLD LDL 39 11/09/22, takes Atovastatin.              OA w/c for mobility, pivot to transfer with assistance, takes Tylenol.    Past Medical History:  Diagnosis Date   Complication of anesthesia    "serious cognisance problems since my OR in January" (03/13/2013   Coronary artery disease    Diverticulosis    Hiatal hernia    Hx of echocardiogram    Echo 5/16:  Mild focal basal septal hypertrophy, EF 55%, mild AI, MV repair ok with trivial MR (mean 3 mmHg), mild LAE, mild RAE, PASP 35 mmHg   Hypertension    Hypothyroidism    Irritable bowel syndrome    PAF (paroxysmal atrial fibrillation) (HCC) 05/03/2014   Personal history of colonic polyps    tubular adenoma   Pleural effusion, bilateral 03/12/2013   Small to moderate, L>R   Prostate cancer [facility]) 1991   sees Dr. Gala Lewandowsky at [place], observation only  S/P CABG x 2 02/28/2013   LIMA to LAD, SVG to OM1, EVH via right thigh   S/P Maze operation for atrial fibrillation 02/28/2013   Complete bilateral atrial lesion set using bipolar radiofrequency and cryothermy ablation with clipping of LA appendage   S/P mitral valve repair, maze procedure, and CABG x2 02/28/2013   Complex valvuloplasty including triangular resection of posterior leaflet, 30 mm Sorin Memo 3D ring annuloplasty   Past Surgical History:  Procedure Laterality Date   CARDIAC CATHETERIZATION  2015   CATARACT EXTRACTION, BILATERAL Bilateral 2014   per Dr. Nile Riggs    COLONOSCOPY  2011   per Dr. Jarold Motto, benign polyps, no repeats needed    CORONARY ARTERY BYPASS GRAFT N/A 02/28/2013   Procedure: CORONARY ARTERY BYPASS GRAFTING (CABG);  Surgeon: Purcell Nails, MD;  Location: [facility] OR;  Service: Open Heart Surgery;  Laterality: N/A;  CABG x 2,  using left internal mammary artery and right leg saphenous  vein harvested endoscopically   ESOPHAGOGASTRODUODENOSCOPY (EGD) WITH PROPOFOL N/A 01/11/2019   Procedure: ESOPHAGOGASTRODUODENOSCOPY (EGD) WITH PROPOFOL;  Surgeon: Sherrilyn Rist, MD;  Location: [facility];  Service: Gastroenterology;  Laterality: N/A;   HEMORRHOID SURGERY     INTRAOPERATIVE TRANSESOPHAGEAL ECHOCARDIOGRAM N/A 02/28/2013   Procedure: INTRAOPERATIVE TRANSESOPHAGEAL ECHOCARDIOGRAM;  Surgeon: Purcell Nails, MD;  Location: [facility] OR;  Service: Open Heart Surgery;  Laterality: N/A;   LEFT HEART CATHETERIZATION WITH CORONARY ANGIOGRAM N/A 02/26/2013   Procedure: LEFT HEART CATHETERIZATION WITH CORONARY ANGIOGRAM;  Surgeon: Marykay Lex, MD;  Location: [facility] CATH LAB;  Service: Cardiovascular;  Laterality: N/A;   MAZE N/A 02/28/2013   Procedure: MAZE;  Surgeon: Purcell Nails, MD;  Location: [facility] OR;  Service: Open Heart Surgery;  Laterality: N/A;   MITRAL VALVE REPAIR N/A 02/28/2013   Procedure: MITRAL VALVE REPAIR (MVR);  Surgeon: Purcell Nails, MD;  Location: [facility] OR;  Service: Open Heart Surgery;  Laterality: N/A;   PROSTATE BIOPSY     TRANSURETHRAL RESECTION OF PROSTATE N/A 02/04/2017   Procedure: TRANSURETHRAL RESECTION OF THE PROSTATE (TURP);  Surgeon: Sebastian Ache, MD;  Location: [facility]S;  Service: Urology;  Laterality: N/A;    Allergies  Allergen Reactions   Other Other (See Comments)    ANESTHESIA.... 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 ANESTHESIA.... 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 Occurred post op CABG 2015, required readmission and rehab    Allergies as of 04/26/2023       Reactions   Other Other (See Comments)   ANESTHESIA.... 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 ANESTHESIA.... 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 Occurred post op CABG 2015, required readmission and rehab        Medication List        Accurate as of April 26, 2023 11:59 PM. If you have any questions, ask  your nurse or doctor.          acetaminophen 325 MG tablet Commonly known as: TYLENOL Take 650 mg by mouth every 4 (four) hours as needed.   acetaminophen 500 MG tablet Commonly known as: TYLENOL Take 1,000 mg by mouth in the morning and at bedtime.   acetaminophen 500 MG tablet Commonly known as: TYLENOL Take 2 tablets (1,000 mg total) by mouth 2 (two) times daily as needed.   atorvastatin 10 MG tablet Commonly known as: LIPITOR TAKE 1 TABLET EVERY DAY AT 6PM   Cyanocobalamin 2500 MCG Subl Place 1  tablet under the tongue daily.   dextromethorphan-guaiFENesin 10-100 MG/5ML liquid Commonly known as: ROBITUSSIN-DM Take 10 mLs by mouth every 4 (four) hours as needed for cough.   divalproex 125 MG DR tablet Commonly known as: DEPAKOTE Take 125 mg by mouth at bedtime.   escitalopram 5 MG tablet Commonly known as: LEXAPRO Take 5 mg by mouth daily.   escitalopram 20 MG tablet Commonly known as: LEXAPRO Take 20 mg by mouth daily.   ferrous sulfate 325 (65 FE) MG EC tablet TAKE 1 TABLET BY MOUTH EVERY DAY   fish oil-omega-3 fatty acids 1000 MG capsule Take 1 g by mouth every morning.   hydrocortisone cream 1 % Apply 1 Application topically as needed. For rash on right cheek   lactose free nutrition Liqd Take 237 mLs by mouth as needed (for supplement).   levothyroxine 50 MCG tablet Commonly known as: SYNTHROID TAKE 1 TABLET BY MOUTH IN THE MORNING   LORazepam 0.5 MG tablet Commonly known as: ATIVAN Take 0.5 mg by mouth every 12 (twelve) hours as needed for anxiety.   METOPROLOL TARTRATE PO Take 12.5 mg by mouth in the morning and at bedtime.   nitroGLYCERIN 0.4 MG SL tablet Commonly known as: NITROSTAT Place 1 tablet (0.4 mg total) under the tongue every 5 (five) minutes as needed for chest pain.   QUEtiapine 25 MG tablet Commonly known as: SEROquel Take 1 tablet (25 mg total) by mouth daily in the afternoon. Give at 2 pm   simethicone 80 MG chewable  tablet Commonly known as: MYLICON Chew 80 mg by mouth every 12 (twelve) hours as needed for flatulence.   sodium phosphate Pediatric 3.5-9.5 GM/59ML enema Place 1 enema rectally daily.   VITAMIN D3 PO Take 2,000 Units by mouth daily.   Vitamin D3 50 MCG (2000 UT) Tabs Take 50 mcg by mouth daily.        Review of Systems  Unable to perform ROS: Dementia    Immunization History  Administered Date(s) Administered   Fluad Quad(high Dose 65+) 11/21/2018, 12/25/2019, 01/02/2021, 12/16/2021, 11/24/2022   Influenza Split 12/15/2006, 12/06/2007, 12/31/2009, 11/01/2011   Influenza Whole 12/15/2006, 12/06/2007, 12/31/2009   Influenza, High Dose Seasonal PF 11/11/2014, 11/12/2015, 11/15/2016, 11/18/2017, 11/21/2018, 01/02/2021   Influenza,inj,Quad PF,6+ Mos 11/01/2012, 11/09/2013   Influenza-Unspecified 11/01/2011, 11/01/2012, 11/09/2013   Moderna Covid-19 Vaccine Bivalent Booster 40yrs & up 12/08/2022   Moderna SARS-COV2 Booster Vaccination 01/07/2020   Moderna Sars-Covid-2 Vaccination 02/26/2019, 03/26/2019   PFIZER(Purple Top)SARS-COV-2 Vaccination 12/29/2021   Pneumococcal Conjugate-13 11/09/2013   Pneumococcal Polysaccharide-23 02/23/2004   Td 02/22/2002   Td (Adult),5 Lf Tetanus Toxid, Preservative Free 02/22/2002   Tdap 01/22/2014   Pertinent  Health Maintenance Due  Topic Date Due   INFLUENZA VACCINE  Completed   Colonoscopy  Discontinued      01/25/2022    1:55 PM 02/05/2022    9:54 AM 05/31/2022    3:57 PM 08/11/2022    2:32 PM 12/29/2022    1:38 PM  Fall Risk  Falls in the past year? 1 1 1  0 1  Was there an injury with Fall? 0 0 0 0 0  Fall Risk Category Calculator 2 2 2  0 2  Fall Risk Category (Retired) Moderate Moderate     (RETIRED) Patient Fall Risk Level Moderate fall risk Moderate fall risk     Patient at Risk for Falls Due to History of fall(s);Impaired balance/gait;Impaired mobility History of fall(s);Impaired balance/gait;Impaired mobility History of  fall(s);Impaired balance/gait;Impaired mobility;Mental status change History  of fall(s);Impaired balance/gait;Impaired mobility;Mental status change History of fall(s);Impaired balance/gait;Impaired mobility  Fall risk Follow up Falls evaluation completed Falls evaluation completed Falls evaluation completed;Education provided;Falls prevention discussed Falls evaluation completed;Education provided;Falls prevention discussed Falls evaluation completed;Education provided;Falls prevention discussed   Functional Status Survey:    Vitals:   04/26/23 1459  BP: (!) 121/97  Pulse: 74  Resp: 17  Temp: 97.9 F (36.6 C)  SpO2: 96%  Weight: 133 lb 11.2 oz (60.6 kg)  Height: 5\' 9"  (1.753 m)   Body mass index is 19.74 kg/m. Physical Exam Vitals and nursing note reviewed.  Constitutional:      Appearance: Normal appearance.  HENT:     Head: Normocephalic and atraumatic.     Nose: Nose normal.     Mouth/Throat:     Mouth: Mucous membranes are moist.  Eyes:     Extraocular Movements: Extraocular movements intact.     Conjunctiva/sclera: Conjunctivae normal.     Pupils: Pupils are equal, round, and reactive to light.  Cardiovascular:     Rate and Rhythm: Normal rate and regular rhythm.     Heart sounds: No murmur heard. Pulmonary:     Effort: Pulmonary effort is normal.     Breath sounds: No rales.  Abdominal:     General: Bowel sounds are normal. There is no distension.     Palpations: Abdomen is soft.     Tenderness: There is no abdominal tenderness. There is no right CVA tenderness, left CVA tenderness, guarding or rebound.  Musculoskeletal:     Cervical back: Normal range of motion and neck supple.     Right lower leg: No edema.     Left lower leg: No edema.  Skin:    General: Skin is warm and dry.  Neurological:     General: No focal deficit present.     Mental Status: He is alert.     Motor: No weakness.     Gait: Gait abnormal.     Comments: Oriented to self.    Psychiatric:        Mood and Affect: Mood normal.     Labs reviewed: Recent Labs    09/16/22 0000 11/09/22 0000 03/29/23 0000  NA 138 140 138  K 4.0 4.2 4.1  CL 104 105 102  CO2 29* 27* 28*  BUN 20 19 16   CREATININE 1.1 1.0 1.0  CALCIUM 8.5* 8.6* 8.7   Recent Labs    07/26/22 0000 09/16/22 0000 11/09/22 0000  AST 14 12* 19  ALT 9* 10 18  ALKPHOS 55 52 58  ALBUMIN 3.1* 2.9* 3.2*   Recent Labs    07/26/22 0000 11/09/22 0000 01/01/23 0000 03/29/23 0000  WBC 4.3 4.4 3.9 4.2  NEUTROABS 2,877.00  --  2,266.00  --   HGB 11.6* 11.7* 10.6* 11.8*  HCT 35* 36* 33* 36*  PLT 145* 158 187 199   Lab Results  Component Value Date   TSH 3.06 03/29/2023   Lab Results  Component Value Date   HGBA1C 5.6 07/01/2016   Lab Results  Component Value Date   CHOL 103 11/09/2022   HDL 46 11/09/2022   LDLCALC 39 11/09/2022   LDLDIRECT 159.3 05/17/2007   TRIG 99 11/09/2022   CHOLHDL 2 01/28/2021    Significant Diagnostic Results in last 30 days:  No results found.  Assessment/Plan  Essential hypertension Blood pressure is controlled, off Metoprolol, Bun/creat 19/1.0 11/09/22  Hypothyroidism taking Levothyroxine, TSH 2.04 11/09/22  Stricture or kinking of ureter  Hx of prostate cancer, s/p TURP 2019, L ureter obstruction/stricture, s/p stent exchange, on Lupron q3 months, followed by Urology  Symptomatic anemia On Fe, Hgb 10/6 01/01/23             Vit B12 deficiency, on supplement  HLD (hyperlipidemia)  LDL 39 11/09/22, takes Atovastatin.   Arthropathy w/c for mobility, pivot to transfer with assistance, takes Tylenol.   Dementia, senile with depression, with behavioral disturbance (HCC) takes Depakote, Lexapro, Seroquel reported occasional agitation, yelling, combative, refusing medication, better since Depakote, Seroquel    Family/ staff Communication: plan of care reviewed with the patient and charge nurse  Labs/tests ordered:  none

## 2023-04-26 NOTE — Assessment & Plan Note (Signed)
 w/c for mobility, pivot to transfer with assistance, takes Tylenol.

## 2023-04-26 NOTE — Assessment & Plan Note (Signed)
 taking Levothyroxine, TSH 2.04 11/09/22

## 2023-04-26 NOTE — Assessment & Plan Note (Signed)
 takes Depakote, Lexapro, Seroquel reported occasional agitation, yelling, combative, refusing medication, better since Depakote, Seroquel

## 2023-04-27 DIAGNOSIS — N131 Hydronephrosis with ureteral stricture, not elsewhere classified: Secondary | ICD-10-CM | POA: Diagnosis not present

## 2023-04-27 DIAGNOSIS — N135 Crossing vessel and stricture of ureter without hydronephrosis: Secondary | ICD-10-CM | POA: Diagnosis not present

## 2023-04-27 DIAGNOSIS — I25110 Atherosclerotic heart disease of native coronary artery with unstable angina pectoris: Secondary | ICD-10-CM | POA: Diagnosis not present

## 2023-04-27 DIAGNOSIS — Z960 Presence of urogenital implants: Secondary | ICD-10-CM | POA: Diagnosis not present

## 2023-04-27 DIAGNOSIS — Z993 Dependence on wheelchair: Secondary | ICD-10-CM | POA: Diagnosis not present

## 2023-04-27 DIAGNOSIS — E039 Hypothyroidism, unspecified: Secondary | ICD-10-CM | POA: Diagnosis not present

## 2023-04-27 DIAGNOSIS — N1330 Unspecified hydronephrosis: Secondary | ICD-10-CM | POA: Diagnosis not present

## 2023-04-27 DIAGNOSIS — I480 Paroxysmal atrial fibrillation: Secondary | ICD-10-CM | POA: Diagnosis not present

## 2023-04-27 DIAGNOSIS — K219 Gastro-esophageal reflux disease without esophagitis: Secondary | ICD-10-CM | POA: Diagnosis not present

## 2023-04-27 DIAGNOSIS — I10 Essential (primary) hypertension: Secondary | ICD-10-CM | POA: Diagnosis not present

## 2023-04-27 DIAGNOSIS — Z79899 Other long term (current) drug therapy: Secondary | ICD-10-CM | POA: Diagnosis not present

## 2023-04-27 DIAGNOSIS — Z466 Encounter for fitting and adjustment of urinary device: Secondary | ICD-10-CM | POA: Diagnosis not present

## 2023-04-28 ENCOUNTER — Encounter: Payer: Self-pay | Admitting: Nurse Practitioner

## 2023-05-25 ENCOUNTER — Encounter: Payer: Self-pay | Admitting: Adult Health

## 2023-05-25 ENCOUNTER — Non-Acute Institutional Stay (SKILLED_NURSING_FACILITY): Payer: Self-pay | Admitting: Adult Health

## 2023-05-25 DIAGNOSIS — F0393 Unspecified dementia, unspecified severity, with mood disturbance: Secondary | ICD-10-CM | POA: Diagnosis not present

## 2023-05-25 DIAGNOSIS — F03918 Unspecified dementia, unspecified severity, with other behavioral disturbance: Secondary | ICD-10-CM

## 2023-05-25 DIAGNOSIS — E782 Mixed hyperlipidemia: Secondary | ICD-10-CM | POA: Diagnosis not present

## 2023-05-25 DIAGNOSIS — E039 Hypothyroidism, unspecified: Secondary | ICD-10-CM

## 2023-05-25 DIAGNOSIS — D509 Iron deficiency anemia, unspecified: Secondary | ICD-10-CM

## 2023-05-25 NOTE — Progress Notes (Addendum)
 Location:  [facility] Room Number: N016-A Place of Service:  SNF [phone]) Provider:  Gillis Santa, NP    Patient Care Team: Venita Sheffield, MD as PCP - General (Internal Medicine) Verner Chol, [facility] (Inactive) as Pharmacist (Pharmacist) Mahlon Gammon, MD as Consulting Physician (Internal Medicine)  Extended Emergency Contact Information Primary Emergency Contact: Bran,Gerry Address: [address]          [place] [address]          [place], [place] [address] D[place] of [place] Home Phone: [phone] Relation: Spouse Secondary Emergency Contact: Wach,Larry Address: [address]          [place], [place] [address] D[place] of [place] Mobile Phone: [phone] Relation: Son  Code Status:  DNR Goals of care: Advanced Directive information    04/26/2023    3:08 PM  Advanced Directives  Does Patient Have a Medical Advance Directive? Yes  Type of Estate agent of [place];Living will;Out of facility DNR (pink MOST or yellow form)  Does patient want to make changes to medical advance directive? No - Patient declined  Copy of Healthcare Power of Attorney in Chart? Yes - validated most recent copy scanned in chart (See row information)  Pre-existing out of facility DNR order (yellow form or pink MOST form) Yellow form placed in chart (order not valid for inpatient use)     Chief Complaint  Patient presents with   routine visit    HPI:  Pt is a 89 y.o. male seen today for  management of chronic medical diseases.    He resides at [facility].  He has dementia with severe cognitive impairment, evidenced by a recent BIMS score of 7/15. He experiences agitation related to his dementia and is managed with Seroquel 25 mg daily.  He has hypothyroidism and is on levothyroxine 50 mcg daily. His TSH level, checked on March 29, 2023, was 3.06, indicating stability.  He is treated for  depression with Lexapro 20 mg daily, and his mood is reported as stable.  He is being treated for anemia with ferrous sulfate 325 mg daily. Iron studies on March 29, 2023, showed an iron total of 67, iron binding capacity of 200, percent saturation of 34, and ferritin of 75. His hemoglobin level was 11.8, also checked on March 29, 2023.  He takes atorvastatin 10 mg daily for hyperlipidemia. His lipid panel on November 09, 2022, showed a cholesterol level of 103, triglycerides of 99, and LDL of 39, indicating well-controlled lipid levels.   Past Medical History:  Diagnosis Date   Complication of anesthesia    "serious cognisance problems since my OR in January" (03/13/2013   Coronary artery disease    Diverticulosis    Hiatal hernia    Hx of echocardiogram    Echo 5/16:  Mild focal basal septal hypertrophy, EF 55%, mild AI, MV repair ok with trivial MR (mean 3 mmHg), mild LAE, mild RAE, PASP 35 mmHg   Hypertension    Hypothyroidism    Irritable bowel syndrome    PAF (paroxysmal atrial fibrillation) (HCC) 05/03/2014   Personal history of colonic polyps    tubular adenoma   Pleural effusion, bilateral 03/12/2013   Small to moderate, L>R   Prostate cancer [facility]) 1991   sees Dr. Gala Lewandowsky at [facility], observation only    S/P CABG x 2 02/28/2013   LIMA to LAD, SVG to OM1, EVH via right thigh  S/P Maze operation for atrial fibrillation 02/28/2013   Complete bilateral atrial lesion set using bipolar radiofrequency and cryothermy ablation with clipping of LA appendage   S/P mitral valve repair, maze procedure, and CABG x2 02/28/2013   Complex valvuloplasty including triangular resection of posterior leaflet, 30 mm Sorin Memo 3D ring annuloplasty   Past Surgical History:  Procedure Laterality Date   CARDIAC CATHETERIZATION  2015   CATARACT EXTRACTION, BILATERAL Bilateral 2014   per Dr. Nile Riggs    COLONOSCOPY  2011   per Dr. Jarold Motto, benign polyps, no repeats needed    CORONARY ARTERY BYPASS  GRAFT N/A 02/28/2013   Procedure: CORONARY ARTERY BYPASS GRAFTING (CABG);  Surgeon: Purcell Nails, MD;  Location: [facility] OR;  Service: Open Heart Surgery;  Laterality: N/A;  CABG x 2,  using left internal mammary artery and right leg saphenous vein harvested endoscopically   ESOPHAGOGASTRODUODENOSCOPY (EGD) WITH PROPOFOL N/A 01/11/2019   Procedure: ESOPHAGOGASTRODUODENOSCOPY (EGD) WITH PROPOFOL;  Surgeon: Sherrilyn Rist, MD;  Location: [facility] ENDOSCOPY;  Service: Gastroenterology;  Laterality: N/A;   HEMORRHOID SURGERY     INTRAOPERATIVE TRANSESOPHAGEAL ECHOCARDIOGRAM N/A 02/28/2013   Procedure: INTRAOPERATIVE TRANSESOPHAGEAL ECHOCARDIOGRAM;  Surgeon: Purcell Nails, MD;  Location: [facility] OR;  Service: Open Heart Surgery;  Laterality: N/A;   LEFT HEART CATHETERIZATION WITH CORONARY ANGIOGRAM N/A 02/26/2013   Procedure: LEFT HEART CATHETERIZATION WITH CORONARY ANGIOGRAM;  Surgeon: Marykay Lex, MD;  Location: [facility] CATH LAB;  Service: Cardiovascular;  Laterality: N/A;   MAZE N/A 02/28/2013   Procedure: MAZE;  Surgeon: Purcell Nails, MD;  Location: [facility] OR;  Service: Open Heart Surgery;  Laterality: N/A;   MITRAL VALVE REPAIR N/A 02/28/2013   Procedure: MITRAL VALVE REPAIR (MVR);  Surgeon: Purcell Nails, MD;  Location: [facility] OR;  Service: Open Heart Surgery;  Laterality: N/A;   PROSTATE BIOPSY     TRANSURETHRAL RESECTION OF PROSTATE N/A 02/04/2017   Procedure: TRANSURETHRAL RESECTION OF THE PROSTATE (TURP);  Surgeon: Sebastian Ache, MD;  Location: [facility] ORS;  Service: Urology;  Laterality: N/A;    Allergies  Allergen Reactions   Other Other (See Comments)    ANESTHESIA.... 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 ANESTHESIA.... 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 Occurred post op CABG 2015, required readmission and rehab    Outpatient Encounter Medications as of 05/25/2023  Medication Sig   acetaminophen (TYLENOL) 325 MG tablet Take 650 mg by mouth every 4 (four) hours as  needed.   acetaminophen (TYLENOL) 500 MG tablet Take 1,000 mg by mouth in the morning and at bedtime.   atorvastatin (LIPITOR) 10 MG tablet TAKE 1 TABLET EVERY DAY AT 6PM   Cholecalciferol (VITAMIN D3) 50 MCG (2000 UT) TABS Take 50 mcg by mouth daily.   Cyanocobalamin 2500 MCG SUBL Place 1 tablet under the tongue daily.   dextromethorphan-guaiFENesin (ROBITUSSIN-DM) 10-100 MG/5ML liquid Take 10 mLs by mouth every 4 (four) hours as needed for cough.   divalproex (DEPAKOTE) 125 MG DR tablet Take 125 mg by mouth at bedtime.   escitalopram (LEXAPRO) 20 MG tablet Take 20 mg by mouth daily.   ferrous sulfate 325 (65 FE) MG EC tablet TAKE 1 TABLET BY MOUTH EVERY DAY   fish oil-omega-3 fatty acids 1000 MG capsule Take 1 g by mouth every morning.   hydrocortisone cream 1 % Apply 1 Application topically as needed. For rash on right cheek   lactose free nutrition (BOOST) LIQD Take 237 mLs by mouth as  needed (for supplement).   levothyroxine (SYNTHROID) 50 MCG tablet TAKE 1 TABLET BY MOUTH IN THE MORNING   nitroGLYCERIN (NITROSTAT) 0.4 MG SL tablet Place 1 tablet (0.4 mg total) under the tongue every 5 (five) minutes as needed for chest pain.   QUEtiapine (SEROQUEL) 25 MG tablet Take 1 tablet (25 mg total) by mouth daily in the afternoon. Give at 2 pm   simethicone (MYLICON) 80 MG chewable tablet Chew 80 mg by mouth every 12 (twelve) hours as needed for flatulence.   acetaminophen (TYLENOL) 500 MG tablet Take 2 tablets (1,000 mg total) by mouth 2 (two) times daily as needed. (Patient not taking: Reported on 05/25/2023)   Cholecalciferol (VITAMIN D3 PO) Take 2,000 Units by mouth daily. (Patient not taking: Reported on 05/25/2023)   escitalopram (LEXAPRO) 5 MG tablet Take 5 mg by mouth daily. (Patient not taking: Reported on 02/08/2023)   LORazepam (ATIVAN) 0.5 MG tablet Take 0.5 mg by mouth every 12 (twelve) hours as needed for anxiety. (Patient not taking: Reported on 02/08/2023)   METOPROLOL TARTRATE PO Take  12.5 mg by mouth in the morning and at bedtime. (Patient not taking: Reported on 02/08/2023)   sodium phosphate Pediatric (FLEET) 3.5-9.5 GM/59ML enema Place 1 enema rectally daily. (Patient not taking: Reported on 05/25/2023)   No facility-administered encounter medications on file as of 05/25/2023.    Review of Systems Unable to obtain due to dementia.  Immunization History  Administered Date(s) Administered   Fluad Quad(high Dose 65+) 11/21/2018, 12/25/2019, 01/02/2021, 12/16/2021, 11/24/2022   Influenza Split 12/15/2006, 12/06/2007, 12/31/2009, 11/01/2011   Influenza Whole 12/15/2006, 12/06/2007, 12/31/2009   Influenza, High Dose Seasonal PF 11/11/2014, 11/12/2015, 11/15/2016, 11/18/2017, 11/21/2018, 01/02/2021   Influenza,inj,Quad PF,6+ Mos 11/01/2012, 11/09/2013   Influenza-Unspecified 11/01/2011, 11/01/2012, 11/09/2013, 11/24/2022   Moderna Covid-19 Vaccine Bivalent Booster 24yrs & up 12/08/2022   Moderna SARS-COV2 Booster Vaccination 01/07/2020   Moderna Sars-Covid-2 Vaccination 02/26/2019, 03/26/2019   PFIZER(Purple Top)SARS-COV-2 Vaccination 12/29/2021   PPD Test 10/06/2022   Pneumococcal Conjugate-13 11/09/2013   Pneumococcal Polysaccharide-23 02/23/2004   Td 02/22/2002   Td (Adult),5 Lf Tetanus Toxid, Preservative Free 02/22/2002   Tdap 01/22/2014   Pertinent  Health Maintenance Due  Topic Date Due   INFLUENZA VACCINE  09/23/2023   Colonoscopy  Discontinued      01/25/2022    1:55 PM 02/05/2022    9:54 AM 05/31/2022    3:57 PM 08/11/2022    2:32 PM 12/29/2022    1:38 PM  Fall Risk  Falls in the past year? 1 1 1  0 1  Was there an injury with Fall? 0 0 0 0 0  Fall Risk Category Calculator 2 2 2  0 2  Fall Risk Category (Retired) Moderate Moderate     (RETIRED) Patient Fall Risk Level Moderate fall risk Moderate fall risk     Patient at Risk for Falls Due to History of fall(s);Impaired balance/gait;Impaired mobility History of fall(s);Impaired balance/gait;Impaired  mobility History of fall(s);Impaired balance/gait;Impaired mobility;Mental status change History of fall(s);Impaired balance/gait;Impaired mobility;Mental status change History of fall(s);Impaired balance/gait;Impaired mobility  Fall risk Follow up Falls evaluation completed Falls evaluation completed Falls evaluation completed;Education provided;Falls prevention discussed Falls evaluation completed;Education provided;Falls prevention discussed Falls evaluation completed;Education provided;Falls prevention discussed   Functional Status Survey:    Vitals:   05/25/23 1150  BP: (!) 140/74  Pulse: 73  Resp: 16  Temp: (!) 97.1 F (36.2 C)  SpO2: 96%  Weight: 131 lb 11.2 oz (59.7 kg)  Height: 5\' 9"  (1.753 m)  Body mass index is 19.45 kg/m. Physical Exam Constitutional:      General: He is not in acute distress. HENT:     Head: Normocephalic and atraumatic.     Mouth/Throat:     Mouth: Mucous membranes are moist.  Eyes:     Conjunctiva/sclera: Conjunctivae normal.  Cardiovascular:     Rate and Rhythm: Normal rate and regular rhythm.     Pulses: Normal pulses.     Heart sounds: Normal heart sounds.  Pulmonary:     Effort: Pulmonary effort is normal.     Breath sounds: Normal breath sounds.  Abdominal:     General: Bowel sounds are normal.     Palpations: Abdomen is soft.  Musculoskeletal:        General: No swelling.     Cervical back: Normal range of motion.  Skin:    General: Skin is warm and dry.  Psychiatric:        Mood and Affect: Mood normal.        Behavior: Behavior normal.     Labs reviewed: Recent Labs    09/16/22 0000 11/09/22 0000 03/29/23 0000  NA 138 140 138  K 4.0 4.2 4.1  CL 104 105 102  CO2 29* 27* 28*  BUN 20 19 16   CREATININE 1.1 1.0 1.0  CALCIUM 8.5* 8.6* 8.7   Recent Labs    07/26/22 0000 09/16/22 0000 11/09/22 0000  AST 14 12* 19  ALT 9* 10 18  ALKPHOS 55 52 58  ALBUMIN 3.1* 2.9* 3.2*   Recent Labs    07/26/22 0000  11/09/22 0000 01/01/23 0000 03/29/23 0000  WBC 4.3 4.4 3.9 4.2  NEUTROABS 2,877.00  --  2,266.00  --   HGB 11.6* 11.7* 10.6* 11.8*  HCT 35* 36* 33* 36*  PLT 145* 158 187 199   Lab Results  Component Value Date   TSH 3.06 03/29/2023   Lab Results  Component Value Date   HGBA1C 5.6 07/01/2016   Lab Results  Component Value Date   CHOL 103 11/09/2022   HDL 46 11/09/2022   LDLCALC 39 11/09/2022   LDLDIRECT 159.3 05/17/2007   TRIG 99 11/09/2022   CHOLHDL 2 01/28/2021    Significant Diagnostic Results in last 30 days:  No results found.  Assessment/Plan  1. Dementia, senile with depression, with behavioral disturbance (HCC) (Primary) -  Severe cognitive impairment with agitation. - Continue Seroquel 25 mg daily. Mood well-managed with Lexapro. - Continue Lexapro 20 mg daily.  2. Iron deficiency anemia, unspecified iron deficiency anemia type -  Anemia well-managed with ferrous sulfate. Recent labs show stable iron levels and hemoglobin at 11.8. - Continue ferrous sulfate 325 mg daily.  3. Acquired hypothyroidism -  Hypothyroidism well-controlled with levothyroxine. TSH level at 3.06. - Continue levothyroxine 50 mcg daily.  4. Mixed hyperlipidemia -  Hyperlipidemia well-controlled with atorvastatin. Recent lipid panel within target range. - Continue atorvastatin 10 mg daily.    Family/ staff Communication:  Discussed plan of care with charge nurse.  Labs/tests ordered:  None

## 2023-05-29 ENCOUNTER — Encounter: Payer: Self-pay | Admitting: Adult Health

## 2023-05-29 NOTE — Progress Notes (Signed)
 This encounter was created in error - please disregard.

## 2023-06-27 ENCOUNTER — Encounter: Payer: Self-pay | Admitting: Sports Medicine

## 2023-06-27 ENCOUNTER — Non-Acute Institutional Stay (SKILLED_NURSING_FACILITY): Payer: Self-pay | Admitting: Sports Medicine

## 2023-06-27 DIAGNOSIS — F02818 Dementia in other diseases classified elsewhere, unspecified severity, with other behavioral disturbance: Secondary | ICD-10-CM | POA: Diagnosis not present

## 2023-06-27 DIAGNOSIS — G309 Alzheimer's disease, unspecified: Secondary | ICD-10-CM

## 2023-06-27 DIAGNOSIS — Z8546 Personal history of malignant neoplasm of prostate: Secondary | ICD-10-CM

## 2023-06-27 DIAGNOSIS — H5789 Other specified disorders of eye and adnexa: Secondary | ICD-10-CM

## 2023-06-27 NOTE — Progress Notes (Unsigned)
 Provider:  Dr. Tye Gall Location:  [facility] Place of Service:   Skilled care   PCP: Tye Gall, MD Patient Care Team: Tye Gall, MD as PCP - General (Internal Medicine) Alver Austin, [facility] (Inactive) as Pharmacist (Pharmacist) Marguerite Shiley, MD as Consulting Physician (Internal Medicine)  Extended Emergency Contact Information Primary Emergency Contact: Primmer,Gerry Address: [address]          [address]          [place], [place] [address] [place] Home Phone: [phone] Relation: Spouse Secondary Emergency Contact: Whorley,Larry Address: [address]          [place], [place] [address] [place] Phone: [phone] Relation: Son  Goals of Care: Advanced Directive information    04/26/2023    3:08 PM  Advanced Directives  Does Patient Have a Medical Advance Directive? Yes  Type of Estate agent of [place];Living will;Out of facility DNR (pink MOST or yellow form)  Does patient want to make changes to medical advance directive? No - Patient declined  Copy of Healthcare Power of Attorney in Chart? Yes - validated most recent copy scanned in chart (See row information)  Pre-existing out of facility DNR order (yellow form or pink MOST form) Yellow form placed in chart (order not valid for inpatient use)       History of Present Illness   89 year old male with a past medical history of prostate cancer, hyperlipidemia, dementia with agitation, anemia, hypothyroidism, generalized anxiety disorder is evaluated for an acute visit for left eye redness. Staff reported that they noticed blue redness in his left eye with bruising on his lower eyelid yesterday.  No reports of fall.  Patient has been dementia and is a poor historian.  He has baseline intermittent confusion and agitation.  Staff reported no new change in his mentation, agitation. Patient is able to tolerate p.o.  intake, no nausea, vomiting. Patient seen and examined in the living room.  He is sitting in his wheelchair eating crackers. He seems pleasant and comfortable and does not appear to be in distress.  When asked if he remember how he hurt his eyeball he could not remember the event.  History is he was laying on the bed and could not tell more information. When asked if he is having pain in his eyeball with movements he could not say if it is eye was hurting.  Patient was his name, not oriented to time or place He cannot remember what he had for breakfast this morning. He follows commands intermittently.    Past Medical History:  Diagnosis Date   Complication of anesthesia    "serious cognisance problems since my OR in January" (03/13/2013   Coronary artery disease    Diverticulosis    Hiatal hernia    Hx of echocardiogram    Echo 5/16:  Mild focal basal septal hypertrophy, EF 55%, mild AI, MV repair ok with trivial MR (mean 3 mmHg), mild LAE, mild RAE, PASP 35 mmHg   Hypertension    Hypothyroidism    Irritable bowel syndrome    PAF (paroxysmal atrial fibrillation) (HCC) 05/03/2014   Personal history of colonic polyps    tubular adenoma   Pleural effusion, bilateral 03/12/2013   Small to moderate, L>R   Prostate cancer [facility]) 1991   sees Dr. Carney Christine at [facility], observation only    S/P CABG x 2 02/28/2013   LIMA to LAD, SVG to OM1, [facility]  via right thigh   S/P Maze operation for atrial fibrillation 02/28/2013   Complete bilateral atrial lesion set using bipolar radiofrequency and cryothermy ablation with clipping of LA appendage   S/P mitral valve repair, maze procedure, and CABG x2 02/28/2013   Complex valvuloplasty including triangular resection of posterior leaflet, 30 mm Sorin Memo 3D ring annuloplasty   Past Surgical History:  Procedure Laterality Date   CARDIAC CATHETERIZATION  2015   CATARACT EXTRACTION, BILATERAL Bilateral 2014   per Dr. Gennie Kicks    COLONOSCOPY  2011   per Dr.  Adan Holms, benign polyps, no repeats needed    CORONARY ARTERY BYPASS GRAFT N/A 02/28/2013   Procedure: CORONARY ARTERY BYPASS GRAFTING (CABG);  Surgeon: Gardenia Jump, MD;  Location: [facility] OR;  Service: Open Heart Surgery;  Laterality: N/A;  CABG x 2,  using left internal mammary artery and right leg saphenous vein harvested endoscopically   ESOPHAGOGASTRODUODENOSCOPY (EGD) WITH PROPOFOL  N/A 01/11/2019   Procedure: ESOPHAGOGASTRODUODENOSCOPY (EGD) WITH PROPOFOL ;  Surgeon: Albertina Hugger, MD;  Location: [facility] ENDOSCOPY;  Service: Gastroenterology;  Laterality: N/A;   HEMORRHOID SURGERY     INTRAOPERATIVE TRANSESOPHAGEAL ECHOCARDIOGRAM N/A 02/28/2013   Procedure: INTRAOPERATIVE TRANSESOPHAGEAL ECHOCARDIOGRAM;  Surgeon: Gardenia Jump, MD;  Location: [facility] OR;  Service: Open Heart Surgery;  Laterality: N/A;   LEFT HEART CATHETERIZATION WITH CORONARY ANGIOGRAM N/A 02/26/2013   Procedure: LEFT HEART CATHETERIZATION WITH CORONARY ANGIOGRAM;  Surgeon: Arleen Lacer, MD;  Location: [facility] CATH LAB;  Service: Cardiovascular;  Laterality: N/A;   MAZE N/A 02/28/2013   Procedure: MAZE;  Surgeon: Gardenia Jump, MD;  Location: [facility] OR;  Service: Open Heart Surgery;  Laterality: N/A;   MITRAL VALVE REPAIR N/A 02/28/2013   Procedure: MITRAL VALVE REPAIR (MVR);  Surgeon: Gardenia Jump, MD;  Location: [facility] OR;  Service: Open Heart Surgery;  Laterality: N/A;   PROSTATE BIOPSY     TRANSURETHRAL RESECTION OF PROSTATE N/A 02/04/2017   Procedure: TRANSURETHRAL RESECTION OF THE PROSTATE (TURP);  Surgeon: Osborn Blaze, MD;  Location: [facility] ORS;  Service: Urology;  Laterality: N/A;    reports that he has never smoked. He has never used smokeless tobacco. He reports that he does not drink alcohol and does not use drugs. Social History   Socioeconomic History   Marital status: Married    Spouse name: Sydna Evangelist   Number of children: 3   Years of education: Not on file   Highest education level: Not on file  Occupational History    Occupation: Retired  Tobacco Use   Smoking status: Never   Smokeless tobacco: Never  Vaping Use   Vaping status: Never Used  Substance and Sexual Activity   Alcohol use: No    Alcohol/week: 0.0 standard drinks of alcohol   Drug use: No   Sexual activity: Not Currently  Other Topics Concern   Not on file  Social History Narrative   Married retired Agricultural consultant   Lives at [employer], independent living though his wife is in a wheelchair and they help each other   1 son lives in [place]  a daughter lives in [place]    Never smoker no alcohol or drugs   Social Drivers of Corporate investment banker Strain: Low Risk  (12/29/2022)   Overall Financial Resource Strain (CARDIA)    Difficulty of Paying Living Expenses: Not hard at all  Food Insecurity: No Food Insecurity (12/29/2022)   Hunger Vital Sign    Worried About Running Out  of Food in the Last Year: Never true    Ran Out of Food in the Last Year: Never true  Transportation Needs: No Transportation Needs (12/29/2022)   PRAPARE - Administrator, Civil Service (Medical): No    Lack of Transportation (Non-Medical): No  Physical Activity: Inactive (12/29/2022)   Exercise Vital Sign    Days of Exercise per Week: 0 days    Minutes of Exercise per Session: 0 min  Stress: No Stress Concern Present (12/29/2022)   [employer] of Occupational Health - Occupational Stress Questionnaire    Feeling of Stress : Not at all  Social Connections: Moderately Isolated (12/29/2022)   Social Connection and Isolation Panel [NHANES]    Frequency of Communication with Friends and Family: Twice a week    Frequency of Social Gatherings with Friends and Family: Twice a week    Attends Religious Services: Never    Database administrator or Organizations: No    Attends Banker Meetings: Never    Marital Status: Married  Catering manager Violence: Not At Risk (12/29/2022)   Humiliation, Afraid,  Rape, and Kick questionnaire    Fear of Current or Ex-Partner: No    Emotionally Abused: No    Physically Abused: No    Sexually Abused: No    Functional Status Survey:    Family History  Problem Relation Age of Onset   Hypertension Mother    Sudden death Mother    Stroke Mother    Heart disease Mother    Hypertension Father    Sudden death Father    Stroke Father    Heart disease Father    Heart disease Son    Diabetes Son    Lung cancer Brother    Stomach cancer Brother    Cancer Daughter        rebdomyosarcoma   Kidney cancer Brother        mets   Diabetes Sister     Health Maintenance  Topic Date Due   Zoster Vaccines- Shingrix (1 of 2) Never done   COVID-19 Vaccine (5 - 2024-25 season) 11/23/2023 (Originally 02/02/2023)   INFLUENZA VACCINE  09/23/2023   Medicare Annual Wellness (AWV)  12/29/2023   DTaP/Tdap/Td (4 - Td or Tdap) 01/23/2024   Pneumonia Vaccine 67+ Years old  Completed   HPV VACCINES  Aged Out   Meningococcal B Vaccine  Aged Out   Colonoscopy  Discontinued    Allergies  Allergen Reactions   Other Other (See Comments)    ANESTHESIA.... 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 ANESTHESIA.... 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 Occurred post op CABG 2015, required readmission and rehab    Outpatient Encounter Medications as of 06/27/2023  Medication Sig   acetaminophen  (TYLENOL ) 325 MG tablet Take 650 mg by mouth every 4 (four) hours as needed.   acetaminophen  (TYLENOL ) 500 MG tablet Take 2 tablets (1,000 mg total) by mouth 2 (two) times daily as needed. (Patient not taking: Reported on 05/25/2023)   acetaminophen  (TYLENOL ) 500 MG tablet Take 1,000 mg by mouth in the morning and at bedtime.   atorvastatin  (LIPITOR) 10 MG tablet TAKE 1 TABLET EVERY DAY AT 6PM   Cholecalciferol  (VITAMIN D3 PO) Take 2,000 Units by mouth daily. (Patient not taking: Reported on 05/25/2023)   Cholecalciferol  (VITAMIN D3) 50 MCG (2000  UT) TABS Take 50 mcg by mouth daily.   Cyanocobalamin  2500 MCG SUBL Place 1 tablet under the  tongue daily.   dextromethorphan-guaiFENesin (ROBITUSSIN-DM) 10-100 MG/5ML liquid Take 10 mLs by mouth every 4 (four) hours as needed for cough.   divalproex  (DEPAKOTE ) 125 MG DR tablet Take 125 mg by mouth at bedtime.   escitalopram (LEXAPRO) 20 MG tablet Take 20 mg by mouth daily.   escitalopram (LEXAPRO) 5 MG tablet Take 5 mg by mouth daily. (Patient not taking: Reported on 02/08/2023)   ferrous sulfate  325 (65 FE) MG EC tablet TAKE 1 TABLET BY MOUTH EVERY DAY   fish oil-omega-3 fatty acids  1000 MG capsule Take 1 g by mouth every morning.   hydrocortisone cream 1 % Apply 1 Application topically as needed. For rash on right cheek   lactose free nutrition (BOOST) LIQD Take 237 mLs by mouth as needed (for supplement).   levothyroxine  (SYNTHROID ) 50 MCG tablet TAKE 1 TABLET BY MOUTH IN THE MORNING   LORazepam  (ATIVAN ) 0.5 MG tablet Take 0.5 mg by mouth every 12 (twelve) hours as needed for anxiety. (Patient not taking: Reported on 02/08/2023)   METOPROLOL  TARTRATE PO Take 12.5 mg by mouth in the morning and at bedtime. (Patient not taking: Reported on 02/08/2023)   nitroGLYCERIN  (NITROSTAT ) 0.4 MG SL tablet Place 1 tablet (0.4 mg total) under the tongue every 5 (five) minutes as needed for chest pain.   QUEtiapine  (SEROQUEL ) 25 MG tablet Take 1 tablet (25 mg total) by mouth daily in the afternoon. Give at 2 pm   simethicone (MYLICON) 80 MG chewable tablet Chew 80 mg by mouth every 12 (twelve) hours as needed for flatulence.   sodium phosphate  Pediatric (FLEET) 3.5-9.5 GM/59ML enema Place 1 enema rectally daily. (Patient not taking: Reported on 05/25/2023)   No facility-administered encounter medications on file as of 06/27/2023.    Review of Systems  Constitutional:  Negative for fever.  Respiratory:  Negative for cough and shortness of breath.   Gastrointestinal:  Negative for abdominal pain, blood in  stool and vomiting.  Genitourinary:  Negative for dysuria.  Psychiatric/Behavioral:  Positive for behavioral problems. Negative for confusion.    Negative unless indicated in HPI.  There were no vitals filed for this visit. There is no height or weight on file to calculate BMI. BP Readings from Last 3 Encounters:  05/25/23 107/71  04/26/23 125/61  04/11/23 135/74   Wt Readings from Last 3 Encounters:  05/25/23 131 lb 11.2 oz (59.7 kg)  04/26/23 133 lb 11.2 oz (60.6 kg)  04/11/23 134 lb 9.6 oz (61.1 kg)   Physical Exam Constitutional:      Appearance: Normal appearance.  HENT:     Head: Normocephalic and atraumatic.  Cardiovascular:     Rate and Rhythm: Normal rate and regular rhythm.     Pulses: Normal pulses.     Heart sounds: Normal heart sounds.  Pulmonary:     Effort: No respiratory distress.     Breath sounds: No stridor. No wheezing or rales.  Abdominal:     General: Bowel sounds are normal. There is no distension.     Palpations: Abdomen is soft.     Tenderness: There is no abdominal tenderness. There is no guarding.  Musculoskeletal:        General: No swelling.  Neurological:     Mental Status: He is alert. Mental status is at baseline.     Motor: No weakness.     Labs reviewed: Basic Metabolic Panel: Recent Labs    09/16/22 0000 11/09/22 0000 03/29/23 0000  NA 138 140 138  K  4.0 4.2 4.1  CL 104 105 102  CO2 29* 27* 28*  BUN 20 19 16   CREATININE 1.1 1.0 1.0  CALCIUM  8.5* 8.6* 8.7   Liver Function Tests: Recent Labs    07/26/22 0000 09/16/22 0000 11/09/22 0000  AST 14 12* 19  ALT 9* 10 18  ALKPHOS 55 52 58  ALBUMIN  3.1* 2.9* 3.2*   No results for input(s): "LIPASE", "AMYLASE" in the last 8760 hours. No results for input(s): "AMMONIA" in the last 8760 hours. CBC: Recent Labs    07/26/22 0000 11/09/22 0000 01/01/23 0000 03/29/23 0000  WBC 4.3 4.4 3.9 4.2  NEUTROABS 2,877.00  --  2,266.00  --   HGB 11.6* 11.7* 10.6* 11.8*  HCT 35*  36* 33* 36*  PLT 145* 158 187 199   Cardiac Enzymes: No results for input(s): "CKTOTAL", "CKMB", "CKMBINDEX", "TROPONINI" in the last 8760 hours. BNP: Invalid input(s): "POCBNP" Lab Results  Component Value Date   HGBA1C 5.6 07/01/2016   Lab Results  Component Value Date   TSH 3.06 03/29/2023   Lab Results  Component Value Date   VITAMINB12 >2,000 03/29/2023   Lab Results  Component Value Date   FOLATE 41.2 01/10/2019   Lab Results  Component Value Date   IRON 67 03/29/2023   TIBC 200 03/29/2023   FERRITIN 75 03/29/2023    Imaging and Procedures obtained prior to SNF admission: CT ABDOMEN PELVIS WO CONTRAST Result Date: 07/29/2021 CLINICAL DATA:  Prostate cancer, assess treatment response. * Tracking Code: BO * EXAM: CT ABDOMEN AND PELVIS WITHOUT CONTRAST TECHNIQUE: Multidetector CT imaging of the abdomen and pelvis was performed following the standard protocol without IV contrast. RADIATION DOSE REDUCTION: This exam was performed according to the departmental dose-optimization program which includes automated exposure control, adjustment of the mA and/or kV according to patient size and/or use of iterative reconstruction technique. COMPARISON:  Multiple priors including most recent CT abdomen pelvis Jul 07, 2020 FINDINGS: Lower chest: No acute abnormality. Large hiatal hernia/intrathoracic stomach with associated compressive atelectasis in the paramedian lower lobes. Mitral annular calcifications with mitral valve prosthesis. Prior median sternotomy and CABG. Hepatobiliary: Scattered calcified hepatic granulomata. No suspicious hepatic lesion on this noncontrast examination. Cholelithiasis with calcified gallbladder wall (porcelain gallbladder). No biliary ductal dilation. Pancreas: No pancreatic ductal dilation or evidence of acute inflammation. Spleen: No splenomegaly or focal splenic lesion. Adrenals/Urinary Tract: Bilateral adrenal glands appear normal. Atrophic left kidney with  unchanged position of the left double-J ureteral stent with pigtails in the left renal pelvis and urinary bladder. Similar to minimally increased soft tissue stranding about the left renal pelvis and proximal left ureter. Right kidney is unremarkable without hydronephrosis. Urinary bladder is decompressed. Stomach/Bowel: Radiopaque enteric contrast material traverses distal loops of small bowel. Large hiatal hernia/intrathoracic stomach. No pathologic dilation of small or large bowel. Sigmoid colonic diverticulosis without findings of acute diverticulitis. Surgical changes of rectal resection with reanastomosis similar prior. Vascular/Lymphatic: Extensive aortic and branch vessel atherosclerosis without abdominal aortic aneurysm. No pathologically enlarged abdominal or pelvic lymph nodes. Reproductive: Prior prostatectomy. Similar soft tissue thickening about the distal left ureter left mesorectal fascia measuring approximally 4.6 x 1.5 cm, unchanged. Other: Trace pelvic free fluid is similar prior. No walled off fluid collections. Musculoskeletal: Similar L5 superior endplate compression deformity. No aggressive lytic or blastic lesion of bone. IMPRESSION: 1. Post prostatectomy with similar soft tissue thickening about the distal left ureter and left mesorectal fascia/presacral space. 2. Unchanged position of the left double-J ureteral stent with  similar to minimally increased stranding about the left renal pelvis and proximal ureter. No discrete hydronephrosis. 3. No convincing evidence of new or progressive disease in the abdomen or pelvis on this noncontrast CT. 4. Large hiatal hernia/intrathoracic stomach appears similar to multiple priors. 5. Cholelithiasis in a porcelain gallbladder. 6. Colonic diverticulosis without findings of acute diverticulitis. 7.  Aortic Atherosclerosis (ICD10-I70.0). Electronically Signed   By: Tama Fails M.D.   On: 07/29/2021 14:42    Assessment and Plan Assessment &  Plan   Left eye redness, bruise Unwitnessed fall Scleral erythema and bruising noted on the lower eyelid with minimal swelling Will start neurochecks every shift for 3 days Instructed staff to monitor for any change in mentation Will get CT head Ophthalmology referral  Major neurocognitive disorder with dementia Continue with supportive care Continue with Depakote , Seroquel  Monitor for any agitation.   History of prostate cancer Patient follows with urology.  He is currently on Lupron  injections He had ureteral stent placed in March and is scheduled for June.  35 min Total time spent for obtaining history,  performing a medically appropriate examination and evaluation, reviewing the tests,ordering  tests,  documenting clinical information in the electronic or other health record,  care coordination (not separately reported)

## 2023-06-29 ENCOUNTER — Encounter: Payer: Self-pay | Admitting: Sports Medicine

## 2023-07-08 ENCOUNTER — Non-Acute Institutional Stay (SKILLED_NURSING_FACILITY): Payer: Self-pay | Admitting: Sports Medicine

## 2023-07-08 DIAGNOSIS — G309 Alzheimer's disease, unspecified: Secondary | ICD-10-CM

## 2023-07-08 DIAGNOSIS — D509 Iron deficiency anemia, unspecified: Secondary | ICD-10-CM | POA: Diagnosis not present

## 2023-07-08 DIAGNOSIS — T148XXA Other injury of unspecified body region, initial encounter: Secondary | ICD-10-CM | POA: Diagnosis not present

## 2023-07-08 DIAGNOSIS — M159 Polyosteoarthritis, unspecified: Secondary | ICD-10-CM | POA: Diagnosis not present

## 2023-07-08 DIAGNOSIS — F02818 Dementia in other diseases classified elsewhere, unspecified severity, with other behavioral disturbance: Secondary | ICD-10-CM | POA: Diagnosis not present

## 2023-07-08 DIAGNOSIS — E039 Hypothyroidism, unspecified: Secondary | ICD-10-CM | POA: Diagnosis not present

## 2023-07-08 DIAGNOSIS — E559 Vitamin D deficiency, unspecified: Secondary | ICD-10-CM

## 2023-07-08 NOTE — Progress Notes (Signed)
 Provider:  Dr. Tye Gall Location:  [facility] Place of Service:   Skilled care   PCP: Tye Gall, MD Patient Care Team: Tye Gall, MD as PCP - General (Internal Medicine) Alver Austin, [facility] (Inactive) as Pharmacist (Pharmacist) Marguerite Shiley, MD as Consulting Physician (Internal Medicine)  Extended Emergency Contact Information Primary Emergency Contact: Shingler,Gerry Address: [address]          [address]          [place], [place] [address] [place] Home Phone: [phone] Relation: Spouse Secondary Emergency Contact: Mccroskey,Larry Address: [address]          [place], [place] [address] [place] Phone: [phone] Relation: Son  Goals of Care: Advanced Directive information    04/26/2023    3:08 PM  Advanced Directives  Does Patient Have a Medical Advance Directive? Yes  Type of Estate agent of [place];Living will;Out of facility DNR (pink MOST or yellow form)  Does patient want to make changes to medical advance directive? No - Patient declined  Copy of Healthcare Power of Attorney in Chart? Yes - validated most recent copy scanned in chart (See row information)  Pre-existing out of facility DNR order (yellow form or pink MOST form) Yellow form placed in chart (order not valid for inpatient use)       History of Present Illness  89 yr old F with h/o Dementia, h/o prostate cancer, depression, HTN, HLD, OA is evaluated for chronic disease management.  Pt seen and examined in the living room  Seems pleasant and comfortable and does not appear to be in distress. He knows her name, not oriented to time or place Can recognize his wife  Pt had a abrasion on his left forehead and bruised left eye ball No change in mentation  Bruise is fading away   Pt has skin tear on his left arm, staff reported redness and drainage from the opening No fevers  Dementia Pt is  wheel chair dependent  Agitation improved.   IRON, TIBC AND FERRITIN PANEL IRON, TOTAL 67 mcg/dL 52-841 Final IRON BINDING CAPACITY 200 mcg/dL (calc) 324-401 L Final % SATURATION 34 % (calc) 20-48 Final FERRITIN 75 ng/mL 24-380 Final  BASIC METABOLIC PANEL GLUCOSE 71 mg/dL 02-72 Final  Fasting reference interval UREA NITROGEN (BUN) 16 mg/dL 5-36 Final CREATININE 6.44 mg/dL 0.34-7.42 Final EGFR 71 mL/min/1.73 m2 > OR = 60 Final BUN/CREATININE RATIO SEE NOTE: (calc) 6-22 Final  Not Reported: BUN and Creatinine are within  reference range. SODIUM 138 mmol/L 135-146 Final POTASSIUM 4.1 mmol/L 3.5-5.3 Final CHLORIDE 102 mmol/L 98-110 Final CARBON DIOXIDE 28 mmol/L 20-32 Final CALCIUM  8.7 mg/dL 5.9-56.3 Final  CBC (H/H, RBC, INDICES, WBC, PLT) WHITE BLOOD CELL COUNT 4.2 Thousand/u L 3.8-10.8 Final RED BLOOD CELL COUNT 3.72 Million/uL 4.20-5.80 L Final HEMOGLOBIN 11.8 g/dL 87.5-64.3 L Final HEMATOCRIT 36.0 % 38.5-50.0 L Final MCV 96.8 fL 80.0-100.0 Final MCH 31.7 pg 27.0-33.0 Final MCHC 32.8 g/dL 32.9-51.8 Final  VITAMIN D ,25-OH,TOTAL,IA 80 ng/mL 30-100 Fin TSH 3.06 mIU/L 0.40-4.50 Fina VITAMIN B12 >2000 pg/mL [phone] H Fina  Past Medical History:  Diagnosis Date   Complication of anesthesia    "serious cognisance problems since my OR in January" (03/13/2013   Coronary artery disease    Diverticulosis    Hiatal hernia    Hx of echocardiogram    Echo 5/16:  Mild focal basal septal hypertrophy, EF 55%, mild AI, MV repair ok with  trivial MR (mean 3 mmHg), mild LAE, mild RAE, PASP 35 mmHg   Hypertension    Hypothyroidism    Irritable bowel syndrome    PAF (paroxysmal atrial fibrillation) (HCC) 05/03/2014   Personal history of colonic polyps    tubular adenoma   Pleural effusion, bilateral 03/12/2013   Small to moderate, L>R   Prostate cancer [facility]) 1991   sees Dr. Carney Christine at [facility], observation only    S/P CABG x 2 02/28/2013   LIMA to LAD, SVG to OM1, EVH via right  thigh   S/P Maze operation for atrial fibrillation 02/28/2013   Complete bilateral atrial lesion set using bipolar radiofrequency and cryothermy ablation with clipping of LA appendage   S/P mitral valve repair, maze procedure, and CABG x2 02/28/2013   Complex valvuloplasty including triangular resection of posterior leaflet, 30 mm Sorin Memo 3D ring annuloplasty   Past Surgical History:  Procedure Laterality Date   CARDIAC CATHETERIZATION  2015   CATARACT EXTRACTION, BILATERAL Bilateral 2014   per Dr. Gennie Kicks    COLONOSCOPY  2011   per Dr. Adan Holms, benign polyps, no repeats needed    CORONARY ARTERY BYPASS GRAFT N/A 02/28/2013   Procedure: CORONARY ARTERY BYPASS GRAFTING (CABG);  Surgeon: Gardenia Jump, MD;  Location: [facility] OR;  Service: Open Heart Surgery;  Laterality: N/A;  CABG x 2,  using left internal mammary artery and right leg saphenous vein harvested endoscopically   ESOPHAGOGASTRODUODENOSCOPY (EGD) WITH PROPOFOL  N/A 01/11/2019   Procedure: ESOPHAGOGASTRODUODENOSCOPY (EGD) WITH PROPOFOL ;  Surgeon: Albertina Hugger, MD;  Location: [facility] ENDOSCOPY;  Service: Gastroenterology;  Laterality: N/A;   HEMORRHOID SURGERY     INTRAOPERATIVE TRANSESOPHAGEAL ECHOCARDIOGRAM N/A 02/28/2013   Procedure: INTRAOPERATIVE TRANSESOPHAGEAL ECHOCARDIOGRAM;  Surgeon: Gardenia Jump, MD;  Location: [facility] OR;  Service: Open Heart Surgery;  Laterality: N/A;   LEFT HEART CATHETERIZATION WITH CORONARY ANGIOGRAM N/A 02/26/2013   Procedure: LEFT HEART CATHETERIZATION WITH CORONARY ANGIOGRAM;  Surgeon: Arleen Lacer, MD;  Location: [facility] CATH LAB;  Service: Cardiovascular;  Laterality: N/A;   MAZE N/A 02/28/2013   Procedure: MAZE;  Surgeon: Gardenia Jump, MD;  Location: [facility] OR;  Service: Open Heart Surgery;  Laterality: N/A;   MITRAL VALVE REPAIR N/A 02/28/2013   Procedure: MITRAL VALVE REPAIR (MVR);  Surgeon: Gardenia Jump, MD;  Location[facility] OR;  Service: Open Heart Surgery;  Laterality: N/A;   PROSTATE BIOPSY      TRANSURETHRAL RESECTION OF PROSTATE N/A 02/04/2017   Procedure: TRANSURETHRAL RESECTION OF THE PROSTATE (TURP);  Surgeon: Osborn Blaze, MD;  Location: [facility] ORS;  Service: Urology;  Laterality: N/A;    reports that he has never smoked. He has never used smokeless tobacco. He reports that he does not drink alcohol and does not use drugs. Social History   Socioeconomic History   Marital status: Married    Spouse name: Sydna Evangelist   Number of children: 3   Years of education: Not on file   Highest education level: Not on file  Occupational History   Occupation: Retired  Tobacco Use   Smoking status: Never   Smokeless tobacco: Never  Vaping Use   Vaping status: Never Used  Substance and Sexual Activity   Alcohol use: No    Alcohol/week: 0.0 standard drinks of alcohol   Drug use: No   Sexual activity: Not Currently  Other Topics Concern   Not on file  Social History Narrative   Married retired Agricultural consultant   Lives at friend's home  [place], independent living though his wife is in a wheelchair and they help each other   1 son lives in [place]  a daughter lives in [place]    Never smoker no alcohol or drugs   Social Drivers of Corporate investment banker Strain: Low Risk  (12/29/2022)   Overall Financial Resource Strain (CARDIA)    Difficulty of Paying Living Expenses: Not hard at all  Food Insecurity: No Food Insecurity (12/29/2022)   Hunger Vital Sign    Worried About Running Out of Food in the Last Year: Never true    Ran Out of Food in the Last Year: Never true  Transportation Needs: No Transportation Needs (12/29/2022)   PRAPARE - Administrator, Civil Service (Medical): No    Lack of Transportation (Non-Medical): No  Physical Activity: Inactive (12/29/2022)   Exercise Vital Sign    Days of Exercise per Week: 0 days    Minutes of Exercise per Session: 0 min  Stress: No Stress Concern Present (12/29/2022)   [employer] of Occupational  Health - Occupational Stress Questionnaire    Feeling of Stress : Not at all  Social Connections: Moderately Isolated (12/29/2022)   Social Connection and Isolation Panel [NHANES]    Frequency of Communication with Friends and Family: Twice a week    Frequency of Social Gatherings with Friends and Family: Twice a week    Attends Religious Services: Never    Database administrator or Organizations: No    Attends Banker Meetings: Never    Marital Status: Married  Catering manager Violence: Not At Risk (12/29/2022)   Humiliation, Afraid, Rape, and Kick questionnaire    Fear of Current or Ex-Partner: No    Emotionally Abused: No    Physically Abused: No    Sexually Abused: No    Functional Status Survey:    Family History  Problem Relation Age of Onset   Hypertension Mother    Sudden death Mother    Stroke Mother    Heart disease Mother    Hypertension Father    Sudden death Father    Stroke Father    Heart disease Father    Heart disease Son    Diabetes Son    Lung cancer Brother    Stomach cancer Brother    Cancer Daughter        rebdomyosarcoma   Kidney cancer Brother        mets   Diabetes Sister     Health Maintenance  Topic Date Due   Zoster Vaccines- Shingrix (1 of 2) 07/28/2023 (Originally 09/24/1946)   COVID-19 Vaccine (5 - 2024-25 season) 11/23/2023 (Originally 02/02/2023)   INFLUENZA VACCINE  09/23/2023   Medicare Annual Wellness (AWV)  12/29/2023   DTaP/Tdap/Td (4 - Td or Tdap) 01/23/2024   Pneumonia Vaccine 79+ Years old  Completed   HPV VACCINES  Aged Out   Meningococcal B Vaccine  Aged Out   Colonoscopy  Discontinued    Allergies  Allergen Reactions   Other Other (See Comments)    ANESTHESIA.... 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 ANESTHESIA.... 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 Occurred post op CABG 2015, required readmission and rehab    Outpatient Encounter Medications as of 07/08/2023   Medication Sig   acetaminophen  (TYLENOL ) 325 MG tablet Take 650 mg by mouth every 4 (four) hours as needed.   acetaminophen  (TYLENOL ) 500 MG tablet Take 2 tablets (1,000 mg total)  by mouth 2 (two) times daily as needed.   atorvastatin  (LIPITOR) 10 MG tablet TAKE 1 TABLET EVERY DAY AT 6PM   Cholecalciferol  (VITAMIN D3) 50 MCG (2000 UT) TABS Take 50 mcg by mouth daily.   Cyanocobalamin  2500 MCG SUBL Place 1 tablet under the tongue daily.   dextromethorphan-guaiFENesin (ROBITUSSIN-DM) 10-100 MG/5ML liquid Take 10 mLs by mouth every 6 (six) hours as needed for cough.   divalproex  (DEPAKOTE ) 125 MG DR tablet Take 125 mg by mouth at bedtime.   escitalopram (LEXAPRO) 20 MG tablet Take 20 mg by mouth daily.   escitalopram (LEXAPRO) 5 MG tablet Take 5 mg by mouth daily. (Patient not taking: Reported on 06/27/2023)   ferrous sulfate  325 (65 FE) MG EC tablet TAKE 1 TABLET BY MOUTH EVERY DAY   fish oil-omega-3 fatty acids  1000 MG capsule Take 1 g by mouth every morning.   hydrocortisone cream 1 % Apply 1 Application topically as needed. For rash on right cheek   lactose free nutrition (BOOST) LIQD Take 237 mLs by mouth daily. In the evening for supplement and as needed   leuprolide  (LUPRON ) 22.5 MG injection Inject 22.5 mg into the muscle every 3 (three) months. On the 22nd for malignant neoplasm of prostate   levothyroxine  (SYNTHROID ) 50 MCG tablet TAKE 1 TABLET BY MOUTH IN THE MORNING   loperamide  (IMODIUM  A-D) 2 MG tablet Take 4 mg by mouth as needed for diarrhea or loose stools (Max 16 mg in 24 hours). . If >3 loose stools in 24hrs, hold all laxatives/stool softeners. If not effective, notify MD. Do Not give if patient has fever, recent history of CDiff, on an antibiotic,recent use of a   LORazepam  (ATIVAN ) 0.5 MG tablet Take 0.5 mg by mouth every 12 (twelve) hours as needed for anxiety. (Patient not taking: Reported on 06/27/2023)   METOPROLOL  TARTRATE PO Take 12.5 mg by mouth in the morning and at  bedtime. (Patient not taking: Reported on 06/27/2023)   nitroGLYCERIN  (NITROSTAT ) 0.4 MG SL tablet Place 1 tablet (0.4 mg total) under the tongue every 5 (five) minutes as needed for chest pain.   QUEtiapine  (SEROQUEL ) 25 MG tablet Take 1 tablet (25 mg total) by mouth daily in the afternoon. Give at 2 pm   simethicone (MYLICON) 80 MG chewable tablet Chew 80 mg by mouth every 12 (twelve) hours as needed for flatulence.   sodium phosphate  Pediatric (FLEET) 3.5-9.5 GM/59ML enema Place 1 enema rectally daily. (Patient not taking: Reported on 06/27/2023)   No facility-administered encounter medications on file as of 07/08/2023.    Review of Systems  Unable to perform ROS: Dementia  Constitutional:  Negative for fever.  Respiratory:  Negative for cough.   Genitourinary:  Negative for dysuria.  Skin:  Positive for wound.  Psychiatric/Behavioral:  Negative for behavioral problems.    Negative unless indicated in HPI.  There were no vitals filed for this visit. There is no height or weight on file to calculate BMI. BP Readings from Last 3 Encounters:  06/27/23 120/69  05/25/23 107/71  04/26/23 125/61   Wt Readings from Last 3 Encounters:  06/27/23 130 lb 12.8 oz (59.3 kg)  05/25/23 131 lb 11.2 oz (59.7 kg)  04/26/23 133 lb 11.2 oz (60.6 kg)   Physical Exam Constitutional:      Appearance: Normal appearance.  HENT:     Head: Normocephalic and atraumatic.  Cardiovascular:     Rate and Rhythm: Normal rate and regular rhythm.     Pulses: Normal  pulses.     Heart sounds: Normal heart sounds.  Pulmonary:     Effort: No respiratory distress.     Breath sounds: No stridor. No wheezing or rales.  Abdominal:     General: Bowel sounds are normal. There is no distension.     Palpations: Abdomen is soft.     Tenderness: There is no abdominal tenderness. There is no right CVA tenderness or guarding.  Skin:    Comments: Left forearm - 2cm superficial open wound with yellowish slough surrounded by  erythema   Neurological:     Mental Status: He is alert. Mental status is at baseline.     Motor: No weakness.     Labs reviewed: Basic Metabolic Panel: Recent Labs    09/16/22 0000 11/09/22 0000 03/29/23 0000  NA 138 140 138  K 4.0 4.2 4.1  CL 104 105 102  CO2 29* 27* 28*  BUN 20 19 16   CREATININE 1.1 1.0 1.0  CALCIUM  8.5* 8.6* 8.7   Liver Function Tests: Recent Labs    07/26/22 0000 09/16/22 0000 11/09/22 0000  AST 14 12* 19  ALT 9* 10 18  ALKPHOS 55 52 58  ALBUMIN  3.1* 2.9* 3.2*   No results for input(s): "LIPASE", "AMYLASE" in the last 8760 hours. No results for input(s): "AMMONIA" in the last 8760 hours. CBC: Recent Labs    07/26/22 0000 11/09/22 0000 01/01/23 0000 03/29/23 0000  WBC 4.3 4.4 3.9 4.2  NEUTROABS 2,877.00  --  2,266.00  --   HGB 11.6* 11.7* 10.6* 11.8*  HCT 35* 36* 33* 36*  PLT 145* 158 187 199   Cardiac Enzymes: No results for input(s): "CKTOTAL", "CKMB", "CKMBINDEX", "TROPONINI" in the last 8760 hours. BNP: Invalid input(s): "POCBNP" Lab Results  Component Value Date   HGBA1C 5.6 07/01/2016   Lab Results  Component Value Date   TSH 3.06 03/29/2023   Lab Results  Component Value Date   VITAMINB12 >2,000 03/29/2023   Lab Results  Component Value Date   FOLATE 41.2 01/10/2019   Lab Results  Component Value Date   IRON 67 03/29/2023   TIBC 200 03/29/2023   FERRITIN 75 03/29/2023    Imaging and Procedures obtained prior to SNF admission: CT ABDOMEN PELVIS WO CONTRAST Result Date: 07/29/2021 CLINICAL DATA:  Prostate cancer, assess treatment response. * Tracking Code: BO * EXAM: CT ABDOMEN AND PELVIS WITHOUT CONTRAST TECHNIQUE: Multidetector CT imaging of the abdomen and pelvis was performed following the standard protocol without IV contrast. RADIATION DOSE REDUCTION: This exam was performed according to the departmental dose-optimization program which includes automated exposure control, adjustment of the mA and/or kV  according to patient size and/or use of iterative reconstruction technique. COMPARISON:  Multiple priors including most recent CT abdomen pelvis Jul 07, 2020 FINDINGS: Lower chest: No acute abnormality. Large hiatal hernia/intrathoracic stomach with associated compressive atelectasis in the paramedian lower lobes. Mitral annular calcifications with mitral valve prosthesis. Prior median sternotomy and CABG. Hepatobiliary: Scattered calcified hepatic granulomata. No suspicious hepatic lesion on this noncontrast examination. Cholelithiasis with calcified gallbladder wall (porcelain gallbladder). No biliary ductal dilation. Pancreas: No pancreatic ductal dilation or evidence of acute inflammation. Spleen: No splenomegaly or focal splenic lesion. Adrenals/Urinary Tract: Bilateral adrenal glands appear normal. Atrophic left kidney with unchanged position of the left double-J ureteral stent with pigtails in the left renal pelvis and urinary bladder. Similar to minimally increased soft tissue stranding about the left renal pelvis and proximal left ureter. Right kidney is unremarkable without  hydronephrosis. Urinary bladder is decompressed. Stomach/Bowel: Radiopaque enteric contrast material traverses distal loops of small bowel. Large hiatal hernia/intrathoracic stomach. No pathologic dilation of small or large bowel. Sigmoid colonic diverticulosis without findings of acute diverticulitis. Surgical changes of rectal resection with reanastomosis similar prior. Vascular/Lymphatic: Extensive aortic and branch vessel atherosclerosis without abdominal aortic aneurysm. No pathologically enlarged abdominal or pelvic lymph nodes. Reproductive: Prior prostatectomy. Similar soft tissue thickening about the distal left ureter left mesorectal fascia measuring approximally 4.6 x 1.5 cm, unchanged. Other: Trace pelvic free fluid is similar prior. No walled off fluid collections. Musculoskeletal: Similar L5 superior endplate compression  deformity. No aggressive lytic or blastic lesion of bone. IMPRESSION: 1. Post prostatectomy with similar soft tissue thickening about the distal left ureter and left mesorectal fascia/presacral space. 2. Unchanged position of the left double-J ureteral stent with similar to minimally increased stranding about the left renal pelvis and proximal ureter. No discrete hydronephrosis. 3. No convincing evidence of new or progressive disease in the abdomen or pelvis on this noncontrast CT. 4. Large hiatal hernia/intrathoracic stomach appears similar to multiple priors. 5. Cholelithiasis in a porcelain gallbladder. 6. Colonic diverticulosis without findings of acute diverticulitis. 7.  Aortic Atherosclerosis (ICD10-I70.0). Electronically Signed   By: Tama Fails M.D.   On: 07/29/2021 14:42    Assessment and Plan Assessment & Plan  Open wound on left forearm  Staff noted yellowish drainage and increased erythema Will start doxycycline Instructed staff to apply xeroform, wrap with Kerlix  Major Neurocognitive disorder Cont with supportive care Increase assistance with ADLS  Cont with depakote    IDA Cont with iron supplements  Hypothyroidism Cont with synthyroid   OA  Cont with tylenol    Vit D deficiency  Vit d levels high Will stop vit d supplements     30 min Total time spent for obtaining history,  performing a medically appropriate examination and evaluation, reviewing the tests,   documenting clinical information in the electronic or other health record,  ,care coordination (not separately reported)

## 2023-07-11 ENCOUNTER — Encounter: Payer: Self-pay | Admitting: Sports Medicine

## 2023-07-19 ENCOUNTER — Non-Acute Institutional Stay (SKILLED_NURSING_FACILITY): Payer: Self-pay | Admitting: Nurse Practitioner

## 2023-07-19 ENCOUNTER — Encounter: Payer: Self-pay | Admitting: Nurse Practitioner

## 2023-07-19 DIAGNOSIS — E782 Mixed hyperlipidemia: Secondary | ICD-10-CM

## 2023-07-19 DIAGNOSIS — D6489 Other specified anemias: Secondary | ICD-10-CM | POA: Diagnosis not present

## 2023-07-19 DIAGNOSIS — I10 Essential (primary) hypertension: Secondary | ICD-10-CM | POA: Diagnosis not present

## 2023-07-19 DIAGNOSIS — F03918 Unspecified dementia, unspecified severity, with other behavioral disturbance: Secondary | ICD-10-CM | POA: Diagnosis not present

## 2023-07-19 DIAGNOSIS — E039 Hypothyroidism, unspecified: Secondary | ICD-10-CM | POA: Diagnosis not present

## 2023-07-19 DIAGNOSIS — M159 Polyosteoarthritis, unspecified: Secondary | ICD-10-CM | POA: Diagnosis not present

## 2023-07-19 DIAGNOSIS — F0393 Unspecified dementia, unspecified severity, with mood disturbance: Secondary | ICD-10-CM

## 2023-07-19 DIAGNOSIS — F03C11 Unspecified dementia, severe, with agitation: Secondary | ICD-10-CM

## 2023-07-19 DIAGNOSIS — N135 Crossing vessel and stricture of ureter without hydronephrosis: Secondary | ICD-10-CM | POA: Diagnosis not present

## 2023-07-19 DIAGNOSIS — T148XXA Other injury of unspecified body region, initial encounter: Secondary | ICD-10-CM | POA: Insufficient documentation

## 2023-07-19 NOTE — Assessment & Plan Note (Addendum)
 reported occasional agitation, yelling, combative, refusing medication, better since Depakote , Seroquel 

## 2023-07-19 NOTE — Assessment & Plan Note (Deleted)
 reported occasional agitation, yelling, combative, refusing medication, better since Depakote , Seroquel 

## 2023-07-19 NOTE — Assessment & Plan Note (Signed)
 On Fe, Fe 67, Vit B12 >2000, Hgb 11.8 03/29/23  Vit B12 deficiency, on supplement

## 2023-07-19 NOTE — Assessment & Plan Note (Signed)
 Blood pressure is controlled, off Metoprolol , Bun/creat 16/1.0 04/18/23

## 2023-07-19 NOTE — Assessment & Plan Note (Signed)
 painful excoriated penis and scrotal areas. Incontinent B+B and using adult depends are contributing, assist the patient with personal hygiene, apply 0.1% Triamcinolone  and Nystatin  cream bid until healed.

## 2023-07-19 NOTE — Assessment & Plan Note (Signed)
 LDL 39 11/09/22, takes Atovastatin.

## 2023-07-19 NOTE — Assessment & Plan Note (Signed)
 w/c for mobility, pivot to transfer with assistance, takes Tylenol.

## 2023-07-19 NOTE — Assessment & Plan Note (Signed)
 taking Levothyroxine , TSH 3.06 03/29/23

## 2023-07-19 NOTE — Assessment & Plan Note (Signed)
 Hx of prostate cancer, s/p TURP 2019, L ureter obstruction/stricture, s/p stent exchange, on Lupron q3 months, followed by Urology

## 2023-07-19 NOTE — Progress Notes (Unsigned)
 Location:   [facility] Room Number: 16 Place of Service:  SNF (31) Provider: [facility] Zyriah Mask NP  Tye Gall, MD  Patient Care Team: Tye Gall, MD as PCP - General (Internal Medicine) Alver Austin, [facility] (Inactive) as Pharmacist (Pharmacist) Marguerite Shiley, MD as Consulting Physician (Internal Medicine)  Extended Emergency Contact Information Primary Emergency Contact: Smigelski,Gerry Address: [address]          [address]          [place], [place] [address] [place] Home Phone: [phone] Relation: Spouse Secondary Emergency Contact: Klopf,Larry Address: [address]          [place], [place] [address] [place] Phone: [phone] Relation: Son  Code Status: DNR Goals of care: Advanced Directive information    04/26/2023    3:08 PM  Advanced Directives  Does Patient Have a Medical Advance Directive? Yes  Type of Estate agent of [place];Living will;Out of facility DNR (pink MOST or yellow form)  Does patient want to make changes to medical advance directive? No - Patient declined  Copy of Healthcare Power of Attorney in Chart? Yes - validated most recent copy scanned in chart (See row information)  Pre-existing out of facility DNR order (yellow form or pink MOST form) Yellow form placed in chart (order not valid for inpatient use)     Chief Complaint  Patient presents with  . Acute Visit    Scrotal redness    HPI:  Pt is a 89 y.o. male seen today for an acute visit for painful excoriated penis and scrotal areas.      Dementia, reported occasional agitation, yelling, combative, refusing medication, better since Depakote , Seroquel               Depression/anxiety, takes Depakote , Lexapro, Seroquel              HTN, off Metoprolol , Bun/creat 16/1.0 04/18/23             Hypothyroidism, taking Levothyroxine , TSH 3.06 03/29/23             Hx of prostate cancer, s/p TURP 2019, L ureter  obstruction/stricture, s/p stent exchange, on Lupron  q3 months, followed by Urology             IDA On Fe, Fe 67, Vit B12 >2000, Hgb 11.8 03/29/23             Vit B12 deficiency, on supplement             HLD LDL 39 11/09/22, takes Atovastatin.              OA w/c for mobility, pivot to transfer with assistance, takes Tylenol .       Past Medical History:  Diagnosis Date  . Complication of anesthesia    "serious cognisance problems since my OR in January" (03/13/2013  . Coronary artery disease   . Diverticulosis   . Hiatal hernia   . Hx of echocardiogram    Echo 5/16:  Mild focal basal septal hypertrophy, EF 55%, mild AI, MV repair ok with trivial MR (mean 3 mmHg), mild LAE, mild RAE, PASP 35 mmHg  . Hypertension   . Hypothyroidism   . Irritable bowel syndrome   . PAF (paroxysmal atrial fibrillation) (HCC) 05/03/2014  . Personal history of colonic polyps    tubular adenoma  . Pleural effusion, bilateral 03/12/2013   Small to moderate, L>R  . Prostate cancer (HCC) 1991  sees Dr. Carney Christine at [place], observation only   . S/P CABG x 2 02/28/2013   LIMA to LAD, SVG to OM1, EVH via right thigh  . S/P Maze operation for atrial fibrillation 02/28/2013   Complete bilateral atrial lesion set using bipolar radiofrequency and cryothermy ablation with clipping of LA appendage  . S/P mitral valve repair, maze procedure, and CABG x2 02/28/2013   Complex valvuloplasty including triangular resection of posterior leaflet, 30 mm Sorin Memo 3D ring annuloplasty   Past Surgical History:  Procedure Laterality Date  . CARDIAC CATHETERIZATION  2015  . CATARACT EXTRACTION, BILATERAL Bilateral 2014   per Dr. Gennie Kicks   . COLONOSCOPY  2011   per Dr. Adan Holms, benign polyps, no repeats needed   . CORONARY ARTERY BYPASS GRAFT N/A 02/28/2013   Procedure: CORONARY ARTERY BYPASS GRAFTING (CABG);  Surgeon: Gardenia Jump, MD;  Location: [facility] OR;  Service: Open Heart Surgery;  Laterality: N/A;  CABG x 2,  using left  internal mammary artery and right leg saphenous vein harvested endoscopically  . ESOPHAGOGASTRODUODENOSCOPY (EGD) WITH PROPOFOL  N/A 01/11/2019   Procedure: ESOPHAGOGASTRODUODENOSCOPY (EGD) WITH PROPOFOL ;  Surgeon: Albertina Hugger, MD;  [facility] END[facility]COPY;  Service: Gastroenterology;  Laterality: N/A;  . HEMORRHOID SURGERY    . INTRAOPERATIVE TRANSESOPHAGEAL ECHOCARDIOGRAM N/A 02/28/2013   Procedure: INTRAOPERATIVE TRANSESOPHAGEAL ECHOCARDIOGRAM;  Surgeon: Gardenia Jump, MD;  Location: [facility] OR;  Service: Open Heart Surgery;  Laterality: N/A;  . LEFT HEART CATHETERIZATION WITH CORONARY ANGIOGRAM N/A 02/26/2013   Procedure: LEFT HEART CATHETERIZATION WITH CORONARY ANGIOGRAM;  Surgeon: Arleen Lacer, MD;  Location: [facility] CATH LAB;  Service: Cardiovascular;  Laterality: N/A;  . MAZE N/A 02/28/2013   Procedure: MAZE;  Surgeon: Gardenia Jump, MD;  Location: [facility] OR;  Service: Open Heart Surgery;  Laterality: N/A;  . MITRAL VALVE REPAIR N/A 02/28/2013   Procedure: MITRAL VALVE REPAIR (MVR);  Surgeon: Gardenia Jump, MD;  Location: [facility] OR;  Service: Open Heart Surgery;  Laterality: N/A;  . PROSTATE BIOPSY    . TRANSURETHRAL RESECTION OF PROSTATE N/A 02/04/2017   Procedure: TRANSURETHRAL RESECTION OF THE PROSTATE (TURP);  Surgeon: Osborn Blaze, MD;  Location: [facility] ORS;  Service: Urology;  Laterality: N/A;    Allergies  Allergen Reactions  . Other Other (See Comments)    ANESTHESIA.... 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 ANESTHESIA.... 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 Occurred post op CABG 2015, required readmission and rehab    Allergies as of 07/19/2023       Reactions   Other Other (See Comments)   ANESTHESIA.... 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 ANESTHESIA.... 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 Occurred post op CABG 2015, required readmission and rehab        Medication List        Accurate as of  Jul 19, 2023  2:48 PM. If you have any questions, ask your nurse or doctor.          acetaminophen  325 MG tablet Commonly known as: TYLENOL  Take 650 mg by mouth every 4 (four) hours as needed.   acetaminophen  500 MG tablet Commonly known as: TYLENOL  Take 2 tablets (1,000 mg total) by mouth 2 (two) times daily as needed.   atorvastatin  10 MG tablet Commonly known as: LIPITOR TAKE 1 TABLET EVERY DAY AT 6PM   dextromethorphan-guaiFENesin 10-100 MG/5ML liquid Commonly known as: ROBITUSSIN-DM Take 10 mLs by mouth every 6 (six)  hours as needed for cough.   divalproex  125 MG DR tablet Commonly known as: DEPAKOTE  Take 125 mg by mouth at bedtime.   escitalopram 20 MG tablet Commonly known as: LEXAPRO Take 20 mg by mouth daily.   ferrous sulfate  325 (65 FE) MG EC tablet TAKE 1 TABLET BY MOUTH EVERY DAY   fish oil-omega-3 fatty acids  1000 MG capsule Take 1 g by mouth every morning.   hydrocortisone cream 1 % Apply 1 Application topically as needed. For rash on right cheek   lactose free nutrition Liqd Take 237 mLs by mouth daily. In the evening for supplement and as needed   leuprolide  22.5 MG injection Commonly known as: LUPRON  Inject 22.5 mg into the muscle every 3 (three) months. On the 22nd for malignant neoplasm of prostate   levothyroxine  50 MCG tablet Commonly known as: SYNTHROID  TAKE 1 TABLET BY MOUTH IN THE MORNING   loperamide  2 MG tablet Commonly known as: IMODIUM  A-D Take 4 mg by mouth as needed for diarrhea or loose stools (Max 16 mg in 24 hours). . If >3 loose stools in 24hrs, hold all laxatives/stool softeners. If not effective, notify MD. Do Not give if patient has fever, recent history of CDiff, on an antibiotic,recent use of a   nitroGLYCERIN  0.4 MG SL tablet Commonly known as: NITROSTAT  Place 1 tablet (0.4 mg total) under the tongue every 5 (five) minutes as needed for chest pain.   QUEtiapine  25 MG tablet Commonly known as: SEROquel  Take 1 tablet  (25 mg total) by mouth daily in the afternoon. Give at 2 pm   simethicone 80 MG chewable tablet Commonly known as: MYLICON Chew 80 mg by mouth every 12 (twelve) hours as needed for flatulence.        Review of Systems  Unable to perform ROS: Dementia    Immunization History  Administered Date(s) Administered  . Fluad Quad(high Dose 65+) 11/21/2018, 12/25/2019, 01/02/2021, 12/16/2021, 11/24/2022  . Influenza Split 12/15/2006, 12/06/2007, 12/31/2009, 11/01/2011  . Influenza Whole 12/15/2006, 12/06/2007, 12/31/2009  . Influenza, High Dose Seasonal PF 11/11/2014, 11/12/2015, 11/15/2016, 11/18/2017, 11/21/2018, 01/02/2021  . Influenza,inj,Quad PF,6+ Mos 11/01/2012, 11/09/2013  . Influenza-Unspecified 11/01/2011, 11/01/2012, 11/09/2013, 11/24/2022  . Moderna Covid-19 Vaccine Bivalent Booster 87yrs & up 12/08/2022  . Moderna SARS-COV2 Booster Vaccination 01/07/2020  . Moderna Sars-Covid-2 Vaccination 02/26/2019, 03/26/2019  . PFIZER(Purple Top)SARS-COV-2 Vaccination 12/29/2021  . PPD Test 10/06/2022  . Pneumococcal Conjugate-13 11/09/2013  . Pneumococcal Polysaccharide-23 02/23/2004  . Td 02/22/2002  . Td (Adult),5 Lf Tetanus Toxid, Preservative Free 02/22/2002  . Tdap 01/22/2014   Pertinent  Health Maintenance Due  Topic Date Due  . INFLUENZA VACCINE  09/23/2023  . Colonoscopy  Discontinued      01/25/2022    1:55 PM 02/05/2022    9:54 AM 05/31/2022    3:57 PM 08/11/2022    2:32 PM 12/29/2022    1:38 PM  Fall Risk  Falls in the past year? 1 1 1  0 1  Was there an injury with Fall? 0 0 0 0 0  Fall Risk Category Calculator 2 2 2  0 2  Fall Risk Category (Retired) Moderate Moderate     (RETIRED) Patient Fall Risk Level Moderate fall risk Moderate fall risk     Patient at Risk for Falls Due to History of fall(s);Impaired balance/gait;Impaired mobility History of fall(s);Impaired balance/gait;Impaired mobility History of fall(s);Impaired balance/gait;Impaired mobility;Mental status  change History of fall(s);Impaired balance/gait;Impaired mobility;Mental status change History of fall(s);Impaired balance/gait;Impaired mobility  Fall risk Follow up  Falls evaluation completed Falls evaluation completed Falls evaluation completed;Education provided;Falls prevention discussed Falls evaluation completed;Education provided;Falls prevention discussed Falls evaluation completed;Education provided;Falls prevention discussed   Functional Status Survey:    Vitals:   07/19/23 1111  BP: 135/76  Pulse: 80  Resp: 18  Temp: (!) 97.1 F (36.2 C)  SpO2: 97%  Weight: 130 lb 12.8 oz (59.3 kg)   Body mass index is 19.32 kg/m. Physical Exam Vitals and nursing note reviewed.  Constitutional:      Appearance: Normal appearance.  HENT:     Head: Normocephalic and atraumatic.     Nose: Nose normal.     Mouth/Throat:     Mouth: Mucous membranes are moist.  Eyes:     Extraocular Movements: Extraocular movements intact.     Conjunctiva/sclera: Conjunctivae normal.     Pupils: Pupils are equal, round, and reactive to light.  Cardiovascular:     Rate and Rhythm: Normal rate and regular rhythm.     Heart sounds: No murmur heard. Pulmonary:     Effort: Pulmonary effort is normal.     Breath sounds: No rales.  Abdominal:     General: Bowel sounds are normal.     Palpations: Abdomen is soft.     Tenderness: There is no abdominal tenderness.  Genitourinary:    Comments: Excoriated redness in penis and scrotal areas Musculoskeletal:     Cervical back: Normal range of motion and neck supple.     Right lower leg: No edema.     Left lower leg: No edema.  Skin:    General: Skin is warm and dry.     Findings: Erythema present.  Neurological:     General: No focal deficit present.     Mental Status: He is alert.     Motor: No weakness.     Gait: Gait abnormal.     Comments: Oriented to self.   Psychiatric:        Mood and Affect: Mood normal.    Labs reviewed: Recent Labs     09/16/22 0000 11/09/22 0000 03/29/23 0000  NA 138 140 138  K 4.0 4.2 4.1  CL 104 105 102  CO2 29* 27* 28*  BUN 20 19 16   CREATININE 1.1 1.0 1.0  CALCIUM  8.5* 8.6* 8.7   Recent Labs    07/26/22 0000 09/16/22 0000 11/09/22 0000  AST 14 12* 19  ALT 9* 10 18  ALKPHOS 55 52 58  ALBUMIN  3.1* 2.9* 3.2*   Recent Labs    07/26/22 0000 11/09/22 0000 01/01/23 0000 03/29/23 0000  WBC 4.3 4.4 3.9 4.2  NEUTROABS 2,877.00  --  2,266.00  --   HGB 11.6* 11.7* 10.6* 11.8*  HCT 35* 36* 33* 36*  PLT 145* 158 187 199   Lab Results  Component Value Date   TSH 3.06 03/29/2023   Lab Results  Component Value Date   HGBA1C 5.6 07/01/2016   Lab Results  Component Value Date   CHOL 103 11/09/2022   HDL 46 11/09/2022   LDLCALC 39 11/09/2022   LDLDIRECT 159.3 05/17/2007   TRIG 99 11/09/2022   CHOLHDL 2 01/28/2021    Significant Diagnostic Results in last 30 days:  No results found.  Assessment/Plan: Excoriation painful excoriated penis and scrotal areas. Incontinent B+B and using adult depends are contributing, assist the patient with personal hygiene, apply 0.1% Triamcinolone  and Nystatin  cream bid until healed.   Dementia, senile with depression, with behavioral disturbance (HCC) reported occasional agitation, yelling, combative,  refusing medication, better since Depakote , Seroquel    Essential hypertension Blood pressure is controlled, off Metoprolol , Bun/creat 16/1.0 04/18/23  Hypothyroidism  taking Levothyroxine , TSH 3.06 03/29/23  Ureteral stricture, left Hx of prostate cancer, s/p TURP 2019, L ureter obstruction/stricture, s/p stent exchange, on Lupron  q3 months, followed by Urology  Anemia due to multiple mechanisms  On Fe, Fe 67, Vit B12 >2000, Hgb 11.8 03/29/23  Vit B12 deficiency, on supplement  HLD (hyperlipidemia) LDL 39 11/09/22, takes Atovastatin.     Family/ staff Communication: plan of care reviewed with the patient and charge nurse.   Labs/tests ordered:   none

## 2023-07-26 ENCOUNTER — Non-Acute Institutional Stay (SKILLED_NURSING_FACILITY): Payer: Self-pay | Admitting: Nurse Practitioner

## 2023-07-26 ENCOUNTER — Encounter: Payer: Self-pay | Admitting: Nurse Practitioner

## 2023-07-26 DIAGNOSIS — N135 Crossing vessel and stricture of ureter without hydronephrosis: Secondary | ICD-10-CM | POA: Diagnosis not present

## 2023-07-26 DIAGNOSIS — E782 Mixed hyperlipidemia: Secondary | ICD-10-CM

## 2023-07-26 DIAGNOSIS — I10 Essential (primary) hypertension: Secondary | ICD-10-CM

## 2023-07-26 DIAGNOSIS — M159 Polyosteoarthritis, unspecified: Secondary | ICD-10-CM | POA: Diagnosis not present

## 2023-07-26 DIAGNOSIS — F03918 Unspecified dementia, unspecified severity, with other behavioral disturbance: Secondary | ICD-10-CM

## 2023-07-26 DIAGNOSIS — M79672 Pain in left foot: Secondary | ICD-10-CM | POA: Diagnosis not present

## 2023-07-26 DIAGNOSIS — M79671 Pain in right foot: Secondary | ICD-10-CM | POA: Diagnosis not present

## 2023-07-26 DIAGNOSIS — F0393 Unspecified dementia, unspecified severity, with mood disturbance: Secondary | ICD-10-CM | POA: Diagnosis not present

## 2023-07-26 DIAGNOSIS — E039 Hypothyroidism, unspecified: Secondary | ICD-10-CM

## 2023-07-26 DIAGNOSIS — B351 Tinea unguium: Secondary | ICD-10-CM | POA: Diagnosis not present

## 2023-07-26 DIAGNOSIS — D6489 Other specified anemias: Secondary | ICD-10-CM

## 2023-07-26 NOTE — Progress Notes (Unsigned)
 Location:  [facility] Room Number: [id number] Place of Service:  SNF (31) Provider:  Rachelann Enloe X, NP    Patient Care Team: Tye Gall, MD as PCP - General (Internal Medicine) Alver Austin, [facility] (Inactive) as Pharmacist (Pharmacist) Marguerite Shiley, MD as Consulting Physician (Internal Medicine)  Extended Emergency Contact Information Primary Emergency Contact: Eyerman,Gerry Address: [address]          [address]          [place], [place] [address] [place] Home Phone: [phone] Relation: Spouse Secondary Emergency Contact: College,Larry Address: [address]          [place], [place] [address] [place] Phone: [phone] Relation: Son  Code Status:  DNR Goals of care: Advanced Directive information    07/26/2023   11:23 AM  Advanced Directives  Does Patient Have a Medical Advance Directive? Yes  Type of Advance Directive Out of facility DNR (pink MOST or yellow form)  Does patient want to make changes to medical advance directive? No - Patient declined  Pre-existing out of facility DNR order (yellow form or pink MOST form) Yellow form placed in chart (order not valid for inpatient use)     Chief Complaint  Patient presents with   Medical Management of Chronic Issues    Routine Visit    HPI:  Pt is a 89 y.o. [name] seen today for medical management of chronic diseases.     Past Medical History:  Diagnosis Date   Complication of anesthesia    "serious cognisance problems since my OR in January" (03/13/2013   Coronary artery disease    Diverticulosis    Hiatal hernia    Hx of echocardiogram    Echo 5/16:  Mild focal basal septal hypertrophy, EF 55%, mild AI, MV repair ok with trivial MR (mean 3 mmHg), mild LAE, mild RAE, PASP 35 mmHg   Hypertension    Hypothyroidism    Irritable bowel syndrome    PAF (paroxysmal atrial fibrillation) (HCC) 05/03/2014   Personal history of colonic polyps     tubular adenoma   Pleural effusion, bilateral 03/12/2013   Small to moderate, L>R   Prostate cancer [facility]) 1991   sees Dr. Carney Christine at [facility], observation only    S/P CABG x 2 02/28/2013   LIMA to LAD, SVG to OM1, EVH via right thigh   S/P Maze operation for atrial fibrillation 02/28/2013   Complete bilateral atrial lesion set using bipolar radiofrequency and cryothermy ablation with clipping of LA appendage   S/P mitral valve repair, maze procedure, and CABG x2 02/28/2013   Complex valvuloplasty including triangular resection of posterior leaflet, 30 mm Sorin Memo 3D ring annuloplasty   Past Surgical History:  Procedure Laterality Date   CARDIAC CATHETERIZATION  2015   CATARACT EXTRACTION, BILATERAL Bilateral 2014   per Dr. Gennie Kicks    COLONOSCOPY  2011   per Dr. Adan Holms, benign polyps, no repeats needed    CORONARY ARTERY BYPASS GRAFT N/A 02/28/2013   Procedure: CORONARY ARTERY BYPASS GRAFTING (CABG);  Surgeon: Gardenia Jump, MD;  Location[facility] OR;  Service: Open Heart Surgery;  Laterality: N/A;  CABG x 2,  using left internal mammary artery and right leg saphenous vein harvested endoscopically   ESOPHAGOGASTRODUODENOSCOPY (EGD) WITH PROPOFOL  N/A 01/11/2019   Procedure: ESOPHAGOGASTRODUODENOSCOPY (EGD) WITH PROPOFOL ;  Surgeon: Albertina Hugger, MD;  Location: [facility] ENDOSCOPY;  Service: Gastroenterology;  Laterality: N/A;   HEMORRHOID  SURGERY     INTRAOPERATIVE TRANSESOPHAGEAL ECHOCARDIOGRAM N/A 02/28/2013   Procedure: INTRAOPERATIVE TRANSESOPHAGEAL ECHOCARDIOGRAM;  Surgeon: Gardenia Jump, MD;  Location: [facility] OR;  Service: Open Heart Surgery;  Laterality: N/A;   LEFT HEART CATHETERIZATION WITH CORONARY ANGIOGRAM N/A 02/26/2013   Procedure: LEFT HEART CATHETERIZATION WITH CORONARY ANGIOGRAM;  Surgeon: Arleen Lacer, MD;  Location: [facility] CATH LAB;  Service: Cardiovascular;  Laterality: N/A;   MAZE N/A 02/28/2013   Procedure: MAZE;  Surgeon: Gardenia Jump, MD;  Location: [facility] OR;  Service: Open Heart  Surgery;  Laterality: N/A;   MITRAL VALVE REPAIR N/A 02/28/2013   Procedure: MITRAL VALVE REPAIR (MVR);  Surgeon: Gardenia Jump, MD;  Location: [facility] OR;  Service: Open Heart Surgery;  Laterality: N/A;   PROSTATE BIOPSY     TRANSURETHRAL RESECTION OF PROSTATE N/A 02/04/2017   Procedure: TRANSURETHRAL RESECTION OF THE PROSTATE (TURP);  Surgeon: Osborn Blaze, MD;  Location: [facility] ORS;  Service: Urology;  Laterality: N/A;    Allergies  Allergen Reactions   Other Other (See Comments)    ANESTHESIA.... 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 ANESTHESIA.... 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 Occurred post op CABG 2015, required readmission and rehab    Outpatient Encounter Medications as of 07/26/2023  Medication Sig   acetaminophen  (TYLENOL ) 325 MG tablet Take 650 mg by mouth every 4 (four) hours as needed.   acetaminophen  (TYLENOL ) 500 MG tablet Take 2 tablets (1,000 mg total) by mouth 2 (two) times daily as needed.   atorvastatin  (LIPITOR) 10 MG tablet TAKE 1 TABLET EVERY DAY AT 6PM   dextromethorphan-guaiFENesin (ROBITUSSIN-DM) 10-100 MG/5ML liquid Take 10 mLs by mouth every 6 (six) hours as needed for cough.   divalproex  (DEPAKOTE ) 125 MG DR tablet Take 125 mg by mouth at bedtime.   escitalopram (LEXAPRO) 20 MG tablet Take 20 mg by mouth daily.   ferrous sulfate  325 (65 FE) MG EC tablet TAKE 1 TABLET BY MOUTH EVERY DAY   fish oil-omega-3 fatty acids  1000 MG capsule Take 1 g by mouth every morning.   hydrocortisone cream 1 % Apply 1 Application topically as needed. For rash on right cheek   lactose free nutrition (BOOST) LIQD Take 237 mLs by mouth daily. In the evening for supplement and as needed   leuprolide  (LUPRON ) 22.5 MG injection Inject 22.5 mg into the muscle every 3 (three) months. On the 22nd for malignant neoplasm of prostate   levothyroxine  (SYNTHROID ) 50 MCG tablet TAKE 1 TABLET BY MOUTH IN THE MORNING   loperamide  (IMODIUM  A-D) 2 MG tablet  Take 4 mg by mouth as needed for diarrhea or loose stools (Max 16 mg in 24 hours). . If >3 loose stools in 24hrs, hold all laxatives/stool softeners. If not effective, notify MD. Do Not give if patient has fever, recent history of CDiff, on an antibiotic,recent use of a   nitroGLYCERIN  (NITROSTAT ) 0.4 MG SL tablet Place 1 tablet (0.4 mg total) under the tongue every 5 (five) minutes as needed for chest pain.   QUEtiapine  (SEROQUEL ) 25 MG tablet Take 1 tablet (25 mg total) by mouth daily in the afternoon. Give at 2 pm   simethicone (MYLICON) 80 MG chewable tablet Chew 80 mg by mouth every 12 (twelve) hours as needed for flatulence.   No facility-administered encounter medications on file as of 07/26/2023.    Review of Systems  Immunization History  Administered Date(s) Administered   Fluad Quad(high Dose 65+) 11/21/2018, 12/25/2019, 01/02/2021,  12/16/2021, 11/24/2022   Influenza Split 12/15/2006, 12/06/2007, 12/31/2009, 11/01/2011   Influenza Whole 12/15/2006, 12/06/2007, 12/31/2009   Influenza, High Dose Seasonal PF 11/11/2014, 11/12/2015, 11/15/2016, 11/18/2017, 11/21/2018, 01/02/2021   Influenza,inj,Quad PF,6+ Mos 11/01/2012, 11/09/2013   Influenza-Unspecified 11/01/2011, 11/01/2012, 11/09/2013, 11/24/2022   Moderna Covid-19 Vaccine Bivalent Booster 36yrs & up 12/08/2022   Moderna SARS-COV2 Booster Vaccination 01/07/2020   Moderna Sars-Covid-2 Vaccination 02/26/2019, 03/26/2019   PFIZER(Purple Top)SARS-COV-2 Vaccination 12/29/2021   PPD Test 10/06/2022   Pneumococcal Conjugate-13 11/09/2013   Pneumococcal Polysaccharide-23 02/23/2004   Td 02/22/2002   Td (Adult),5 Lf Tetanus Toxid, Preservative Free 02/22/2002   Tdap 01/22/2014   Pertinent  Health Maintenance Due  Topic Date Due   INFLUENZA VACCINE  09/23/2023   Colonoscopy  Discontinued      01/25/2022    1:55 PM 02/05/2022    9:54 AM 05/31/2022    3:57 PM 08/11/2022    2:32 PM 12/29/2022    1:Luis PM  Fall Risk  Falls in the  past year? 1 1 1  0 1  Was there an injury with Fall? 0 0 0 0 0  Fall Risk Category Calculator 2 2 2  0 2  Fall Risk Category (Retired) Moderate Moderate     (RETIRED) Patient Fall Risk Level Moderate fall risk Moderate fall risk     Patient at Risk for Falls Due to History of fall(s);Impaired balance/gait;Impaired mobility History of fall(s);Impaired balance/gait;Impaired mobility History of fall(s);Impaired balance/gait;Impaired mobility;Mental status change History of fall(s);Impaired balance/gait;Impaired mobility;Mental status change History of fall(s);Impaired balance/gait;Impaired mobility  Fall risk Follow up Falls evaluation completed Falls evaluation completed Falls evaluation completed;Education provided;Falls prevention discussed Falls evaluation completed;Education provided;Falls prevention discussed Falls evaluation completed;Education provided;Falls prevention discussed   Functional Status Survey:    Vitals:   07/26/23 1131  BP: 122/62  Pulse: 72  Resp: 16  Temp: (!) 97.4 F (36.3 C)  SpO2: 98%  Weight: 134 lb 9.6 oz (61.1 kg)  Height: 5\' 9"  (1.753 m)   Body mass index is 19.88 kg/m. Physical Exam  Labs reviewed: Recent Labs    09/16/22 0000 11/09/22 0000 03/29/23 0000  NA 138 140 138  K 4.0 4.2 4.1  CL 104 105 102  CO2 29* 27* 28*  BUN 20 19 16   CREATININE 1.1 1.0 1.0  CALCIUM  8.5* 8.6* 8.7   Recent Labs    09/16/22 0000 11/09/22 0000  AST 12* 19  ALT 10 18  ALKPHOS 52 58  ALBUMIN  2.9* 3.2*   Recent Labs    11/09/22 0000 01/01/23 0000 03/29/23 0000  WBC 4.4 3.9 4.2  NEUTROABS  --  2,266.00  --   HGB 11.7* 10.6* 11.8*  HCT 36* 33* 36*  PLT 158 187 199   Lab Results  Component Value Date   TSH 3.06 03/29/2023   Lab Results  Component Value Date   HGBA1C 5.6 07/01/2016   Lab Results  Component Value Date   CHOL 103 11/09/2022   HDL 46 11/09/2022   LDLCALC 39 11/09/2022   LDLDIRECT 159.3 05/17/2007   TRIG 99 11/09/2022   CHOLHDL 2  01/28/2021    Significant Diagnostic Results in last 30 days:  No results found.  Assessment/Plan There are no diagnoses linked to this encounter.   Family/ staff Communication: ***  Labs/tests ordered:  ***

## 2023-07-28 ENCOUNTER — Encounter: Payer: Self-pay | Admitting: Nurse Practitioner

## 2023-07-28 NOTE — Assessment & Plan Note (Signed)
 On Fe, Vit B12, Fe 67, Vit B12 >2000, Hgb 11.8 03/29/23

## 2023-07-28 NOTE — Assessment & Plan Note (Signed)
 Blood pressure is in control,  off Metoprolol , Bun/creat 16/1.0 04/18/23

## 2023-07-28 NOTE — Assessment & Plan Note (Signed)
 Hx of prostate cancer, s/p TURP 2019, L ureter obstruction/stricture, s/p stent exchange, on Lupron q3 months, followed by Urology

## 2023-07-28 NOTE — Assessment & Plan Note (Signed)
 taking Levothyroxine , TSH 3.06 03/29/23

## 2023-07-28 NOTE — Assessment & Plan Note (Signed)
 reported occasional agitation, yelling, combative, refusing medication, better since Depakote , Seroquel , Lexapro.

## 2023-07-28 NOTE — Assessment & Plan Note (Signed)
 w/c for mobility, pivot to transfer with assistance, takes Tylenol.

## 2023-07-28 NOTE — Assessment & Plan Note (Signed)
 LDL 39 11/09/22, takes Atovastatin.

## 2023-08-03 DIAGNOSIS — K219 Gastro-esophageal reflux disease without esophagitis: Secondary | ICD-10-CM | POA: Diagnosis not present

## 2023-08-03 DIAGNOSIS — E039 Hypothyroidism, unspecified: Secondary | ICD-10-CM | POA: Diagnosis not present

## 2023-08-03 DIAGNOSIS — Z79899 Other long term (current) drug therapy: Secondary | ICD-10-CM | POA: Diagnosis not present

## 2023-08-03 DIAGNOSIS — I10 Essential (primary) hypertension: Secondary | ICD-10-CM | POA: Diagnosis not present

## 2023-08-03 DIAGNOSIS — C61 Malignant neoplasm of prostate: Secondary | ICD-10-CM | POA: Diagnosis not present

## 2023-08-03 DIAGNOSIS — I25110 Atherosclerotic heart disease of native coronary artery with unstable angina pectoris: Secondary | ICD-10-CM | POA: Diagnosis not present

## 2023-08-03 DIAGNOSIS — Z466 Encounter for fitting and adjustment of urinary device: Secondary | ICD-10-CM | POA: Diagnosis not present

## 2023-08-03 DIAGNOSIS — N135 Crossing vessel and stricture of ureter without hydronephrosis: Secondary | ICD-10-CM | POA: Diagnosis not present

## 2023-08-03 DIAGNOSIS — I480 Paroxysmal atrial fibrillation: Secondary | ICD-10-CM | POA: Diagnosis not present

## 2023-08-03 DIAGNOSIS — E785 Hyperlipidemia, unspecified: Secondary | ICD-10-CM | POA: Diagnosis not present

## 2023-08-03 DIAGNOSIS — Z951 Presence of aortocoronary bypass graft: Secondary | ICD-10-CM | POA: Diagnosis not present

## 2023-08-11 ENCOUNTER — Non-Acute Institutional Stay (SKILLED_NURSING_FACILITY): Payer: Self-pay | Admitting: Nurse Practitioner

## 2023-08-11 ENCOUNTER — Encounter: Payer: Self-pay | Admitting: Nurse Practitioner

## 2023-08-11 DIAGNOSIS — F03918 Unspecified dementia, unspecified severity, with other behavioral disturbance: Secondary | ICD-10-CM

## 2023-08-11 DIAGNOSIS — E782 Mixed hyperlipidemia: Secondary | ICD-10-CM | POA: Diagnosis not present

## 2023-08-11 DIAGNOSIS — K219 Gastro-esophageal reflux disease without esophagitis: Secondary | ICD-10-CM | POA: Diagnosis not present

## 2023-08-11 DIAGNOSIS — F0393 Unspecified dementia, unspecified severity, with mood disturbance: Secondary | ICD-10-CM | POA: Diagnosis not present

## 2023-08-11 DIAGNOSIS — N135 Crossing vessel and stricture of ureter without hydronephrosis: Secondary | ICD-10-CM | POA: Diagnosis not present

## 2023-08-11 DIAGNOSIS — D6489 Other specified anemias: Secondary | ICD-10-CM | POA: Diagnosis not present

## 2023-08-11 DIAGNOSIS — E039 Hypothyroidism, unspecified: Secondary | ICD-10-CM | POA: Diagnosis not present

## 2023-08-11 DIAGNOSIS — I10 Essential (primary) hypertension: Secondary | ICD-10-CM | POA: Diagnosis not present

## 2023-08-11 NOTE — Assessment & Plan Note (Addendum)
 reported pain and tenderness in substernal area, had episode of vomiting 08/09/23. The patient stated he has heartburns sometimes, but denied pain, heartburns, indigestion, or vomiting upon today's visit.  GERD vs angina, adding PPI Omeprazole 20mg  every day, may try NTG as needed as well.  Update CBC/diff, CMP/eGFR, TSH, lipids, lipase

## 2023-08-11 NOTE — Assessment & Plan Note (Signed)
 taking Levothyroxine , TSH 3.06 03/29/23

## 2023-08-11 NOTE — Progress Notes (Unsigned)
 Location:   [facility]  Nursing Home Room Number: 16-A Place of Service:  SNF (31) Provider:  Belia Febo, NP  PCP: Tye Gall, MD  Patient Care Team: Tye Gall, MD as PCP - General (Internal Medicine) Alver Austin, [facility] (Inactive) as Pharmacist (Pharmacist) Marguerite Shiley, MD as Consulting Physician (Internal Medicine)  Extended Emergency Contact Information Primary Emergency Contact: Ritter,Gerry Address: [address]          [address]          [place], [place] [address] [place] Home Phone: [phone] Relation: Spouse Secondary Emergency Contact: Diesing,Larry Address: [address]          [place], [place] [address] [place] Phone: [phone] Relation: Son  Code Status:  DNR Goals of care: Advanced Directive information    08/11/2023   10:42 AM  Advanced Directives  Does Patient Have a Medical Advance Directive? Yes  Type of Estate agent of [place];Living will;Out of facility DNR (pink MOST or yellow form)  Does patient want to make changes to medical advance directive? No - Patient declined  Copy of Healthcare Power of Attorney in Chart? Yes - validated most recent copy scanned in chart (See row information)     Chief Complaint  Patient presents with  . Groin Pain    Pain in substernal groin    HPI:  Pt is a 89 y.o. male seen today for an acute visit for reported pain and tenderness in substernal area, had episode of vomiting 08/09/23. The patient stated he has heartburns sometimes, but denied pain, heartburns, indigestion, or vomiting upon today's visit.   Dementia, reported occasional agitation, yelling, combative, refusing medication, better since Depakote , Seroquel               Depression/anxiety, takes Depakote , Lexapro, Seroquel              HTN, off Metoprolol , Bun/creat 16/1.0 04/18/23             Hypothyroidism, taking Levothyroxine , TSH 3.06 03/29/23              Hx of prostate cancer, s/p TURP 2019, L ureter obstruction/stricture, s/p stent exchange, on Lupron  q3 months, followed by Urology             IDA On Fe, Fe 67, Vit B12 >2000, Hgb 11.8 03/29/23             Vit B12 deficiency, on supplement             HLD LDL 39 11/09/22, takes Atovastatin.              OA w/c for mobility, pivot to transfer with assistance, takes Tylenol .   CAD/unstable angina, s/p heart surgery, on Atorvastatin , prn NTG      Past Medical History:  Diagnosis Date  . Complication of anesthesia    serious cognisance problems since my OR in January (03/13/2013  . Coronary artery disease   . Diverticulosis   . Hiatal hernia   . Hx of echocardiogram    Echo 5/16:  Mild focal basal septal hypertrophy, EF 55%, mild AI, MV repair ok with trivial MR (mean 3 mmHg), mild LAE, mild RAE, PASP 35 mmHg  . Hypertension   . Hypothyroidism   . Irritable bowel syndrome   . PAF (paroxysmal atrial fibrillation) (HCC) 05/03/2014  . Personal history of colonic polyps    tubular adenoma  . Pleural effusion, bilateral 03/12/2013  Small to moderate, L>R  . Prostate cancer [facility]) 1991   sees Dr. Carney Christine at [facility], observation only   . S/P CABG x 2 02/28/2013   LIMA to LAD, SVG to OM1, EVH via right thigh  . S/P Maze operation for atrial fibrillation 02/28/2013   Complete bilateral atrial lesion set using bipolar radiofrequency and cryothermy ablation with clipping of LA appendage  . S/P mitral valve repair, maze procedure, and CABG x2 02/28/2013   Complex valvuloplasty including triangular resection of posterior leaflet, 30 mm Sorin Memo 3D ring annuloplasty   Past Surgical History:  Procedure Laterality Date  . CARDIAC CATHETERIZATION  2015  . CATARACT EXTRACTION, BILATERAL Bilateral 2014   per Dr. Gennie Kicks   . COLONOSCOPY  2011   per Dr. Adan Holms, benign polyps, no repeats needed   . CORONARY ARTERY BYPASS GRAFT N/A 02/28/2013   Procedure: CORONARY ARTERY BYPASS GRAFTING (CABG);  Surgeon:  Gardenia Jump, MD;  Location: [facility] OR;  Service: Open Heart Surgery;  Laterality: N/A;  CABG x 2,  using left internal mammary artery and right leg saphenous vein harvested endoscopically  . ESOPHAGOGASTRODUODENOSCOPY (EGD) WITH PROPOFOL  N/A 01/11/2019   Procedure: ESOPHAGOGASTRODUODENOSCOPY (EGD) WITH PROPOFOL ;  Surgeon: Albertina Hugger, MD;  Location: [facility] ENDOSCOPY;  Service: Gastroenterology;  Laterality: N/A;  . HEMORRHOID SURGERY    . INTRAOPERATIVE TRANSESOPHAGEAL ECHOCARDIOGRAM N/A 02/28/2013   Procedure: INTRAOPERATIVE TRANSESOPHAGEAL ECHOCARDIOGRAM;  Surgeon: Gardenia Jump, MD;  Location: [facility] OR;  Service: Open Heart Surgery;  Laterality: N/A;  . LEFT HEART CATHETERIZATION WITH CORONARY ANGIOGRAM N/A 02/26/2013   Procedure: LEFT HEART CATHETERIZATION WITH CORONARY ANGIOGRAM;  Surgeon: Arleen Lacer, MD;  Location: [facility] CATH LAB;  Service: Cardiovascular;  Laterality: N/A;  . MAZE N/A 02/28/2013   Procedure: MAZE;  Surgeon: Gardenia Jump, MD;  Location: [facility] OR;  Service: Open Heart Surgery;  Laterality: N/A;  . MITRAL VALVE REPAIR N/A 02/28/2013   Procedure: MITRAL VALVE REPAIR (MVR);  Surgeon: Gardenia Jump, MD;  Location: [facility] OR;  Service: Open Heart Surgery;  Laterality: N/A;  . PROSTATE BIOPSY    . TRANSURETHRAL RESECTION OF PROSTATE N/A 02/04/2017   Procedure: TRANSURETHRAL RESECTION OF THE PROSTATE (TURP);  Surgeon: Osborn Blaze, MD;  Location: [facility] ORS;  Service: Urology;  Laterality: N/A;    Allergies  Allergen Reactions  . Other Other (See Comments)    ANESTHESIA.... 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 ANESTHESIA.... 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 Occurred post op CABG 2015, required readmission and rehab    Allergies as of 08/11/2023       Reactions   Other Other (See Comments)   ANESTHESIA.... 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 ANESTHESIA.... Gives him like Liam Redhead Syndrome per family  member. Occurred post op CABG 2015, required readmission and rehab        Medication List        Accurate as of August 11, 2023 12:47 PM. If you have any questions, ask your nurse or doctor.          acetaminophen  325 MG tablet Commonly known as: TYLENOL  Take 650 mg by mouth every 4 (four) hours as needed.   acetaminophen  500 MG tablet Commonly known as: TYLENOL  Take 2 tablets (1,000 mg total) by mouth 2 (two) times daily as needed.   atorvastatin  10 MG tablet Commonly known as: LIPITOR TAKE 1 TABLET EVERY DAY AT 6PM   dextromethorphan-guaiFENesin 10-100 MG/5ML liquid Commonly  known as: ROBITUSSIN-DM Take 10 mLs by mouth every 6 (six) hours as needed for cough.   divalproex  125 MG DR tablet Commonly known as: DEPAKOTE  Take 125 mg by mouth at bedtime.   escitalopram 20 MG tablet Commonly known as: LEXAPRO Take 20 mg by mouth daily.   ferrous sulfate  325 (65 FE) MG EC tablet TAKE 1 TABLET BY MOUTH EVERY DAY   fish oil-omega-3 fatty acids  1000 MG capsule Take 1 g by mouth every morning.   hydrocortisone cream 1 % Apply 1 Application topically as needed. For rash on right cheek   lactose free nutrition Liqd Take 237 mLs by mouth 3 (three) times daily.   leuprolide  22.5 MG injection Commonly known as: LUPRON  Inject 22.5 mg into the muscle every 3 (three) months. On the 22nd for malignant neoplasm of prostate   levothyroxine  50 MCG tablet Commonly known as: SYNTHROID  TAKE 1 TABLET BY MOUTH IN THE MORNING   loperamide  2 MG tablet Commonly known as: IMODIUM  A-D Take 4 mg by mouth as needed for diarrhea or loose stools (Max 16 mg in 24 hours). . If >3 loose stools in 24hrs, hold all laxatives/stool softeners. If not effective, notify MD. Do Not give if patient has fever, recent history of CDiff, on an antibiotic,recent use of a   nitroGLYCERIN  0.4 MG SL tablet Commonly known as: NITROSTAT  Place 1 tablet (0.4 mg total) under the tongue every 5 (five) minutes as  needed for chest pain.   nystatin  cream Commonly known as: MYCOSTATIN  Apply 1 Application topically 2 (two) times daily. Apply to penis and scrotum for reddened areas until healed.   QUEtiapine  25 MG tablet Commonly known as: SEROquel  Take 1 tablet (25 mg total) by mouth daily in the afternoon. Give at 2 pm   simethicone 80 MG chewable tablet Commonly known as: MYLICON Chew 80 mg by mouth every 12 (twelve) hours as needed for flatulence.        Review of Systems  Unable to perform ROS: Dementia  Gastrointestinal:  Positive for vomiting. Negative for abdominal pain and nausea.       Reported substernal region pain, heartburns on and off  With assistance of staff  Immunization History  Administered Date(s) Administered  . Fluad Quad(high Dose 65+) 11/21/2018, 12/25/2019, 01/02/2021, 12/16/2021, 11/24/2022  . Influenza Split 12/15/2006, 12/06/2007, 12/31/2009, 11/01/2011  . Influenza Whole 12/15/2006, 12/06/2007, 12/31/2009  . Influenza, High Dose Seasonal PF 11/11/2014, 11/12/2015, 11/15/2016, 11/18/2017, 11/21/2018, 01/02/2021  . Influenza,inj,Quad PF,6+ Mos 11/01/2012, 11/09/2013  . Influenza-Unspecified 11/01/2011, 11/01/2012, 11/09/2013, 11/24/2022  . Moderna Covid-19 Vaccine Bivalent Booster 59yrs & up 12/08/2022  . Moderna SARS-COV2 Booster Vaccination 01/07/2020  . Moderna Sars-Covid-2 Vaccination 02/26/2019, 03/26/2019  . PFIZER(Purple Top)SARS-COV-2 Vaccination 12/29/2021  . PPD Test 10/06/2022  . Pneumococcal Conjugate-13 11/09/2013  . Pneumococcal Polysaccharide-23 02/23/2004  . Td 02/22/2002  . Td (Adult),5 Lf Tetanus Toxid, Preservative Free 02/22/2002  . Tdap 01/22/2014   Pertinent  Health Maintenance Due  Topic Date Due  . INFLUENZA VACCINE  09/23/2023  . Colonoscopy  Discontinued      01/25/2022    1:55 PM 02/05/2022    9:54 AM 05/31/2022    3:57 PM 08/11/2022    2:32 PM 12/29/2022    1:38 PM  Fall Risk  Falls in the past year? 1 1 1  0 1  Was there an  injury with Fall? 0 0 0 0 0  Fall Risk Category Calculator 2 2 2  0 2  Fall Risk Category (Retired) Moderate  Moderate      (RETIRED) Patient Fall Risk Level Moderate fall risk  Moderate fall risk      Patient at Risk for Falls Due to History of fall(s);Impaired balance/gait;Impaired mobility History of fall(s);Impaired balance/gait;Impaired mobility History of fall(s);Impaired balance/gait;Impaired mobility;Mental status change History of fall(s);Impaired balance/gait;Impaired mobility;Mental status change History of fall(s);Impaired balance/gait;Impaired mobility  Fall risk Follow up Falls evaluation completed  Falls evaluation completed  Falls evaluation completed;Education provided;Falls prevention discussed Falls evaluation completed;Education provided;Falls prevention discussed Falls evaluation completed;Education provided;Falls prevention discussed     Data saved with a previous flowsheet row definition   Functional Status Survey:    Vitals:   08/11/23 1030  BP: 103/60  Pulse: 87  Resp: 16  Temp: (!) 97.4 F (36.3 C)  SpO2: 98%  Weight: 141 lb (64 kg)  Height: 5' 9 (1.753 m)   Body mass index is 20.82 kg/m. Physical Exam Vitals and nursing note reviewed.  Constitutional:      Appearance: Normal appearance.  HENT:     Head: Normocephalic and atraumatic.     Nose: Nose normal.     Mouth/Throat:     Mouth: Mucous membranes are moist.   Eyes:     Extraocular Movements: Extraocular movements intact.     Conjunctiva/sclera: Conjunctivae normal.     Pupils: Pupils are equal, round, and reactive to light.    Cardiovascular:     Rate and Rhythm: Normal rate and regular rhythm.     Heart sounds: No murmur heard. Pulmonary:     Effort: Pulmonary effort is normal.     Breath sounds: No rales.  Abdominal:     General: Bowel sounds are normal.     Palpations: Abdomen is soft.     Tenderness: There is no abdominal tenderness.  Genitourinary:    Comments: Excoriated redness  in penis and scrotal areas  Musculoskeletal:     Cervical back: Normal range of motion and neck supple.     Right lower leg: No edema.     Left lower leg: No edema.   Skin:    General: Skin is warm and dry.   Neurological:     General: No focal deficit present.     Mental Status: He is alert.     Motor: No weakness.     Gait: Gait abnormal.     Comments: Oriented to self.   Psychiatric:        Mood and Affect: Mood normal.    Labs reviewed: Recent Labs    09/16/22 0000 11/09/22 0000 03/29/23 0000  NA 138 140 138  K 4.0 4.2 4.1  CL 104 105 102  CO2 29* 27* 28*  BUN 20 19 16   CREATININE 1.1 1.0 1.0  CALCIUM  8.5* 8.6* 8.7   Recent Labs    09/16/22 0000 11/09/22 0000  AST 12* 19  ALT 10 18  ALKPHOS 52 58  ALBUMIN  2.9* 3.2*   Recent Labs    11/09/22 0000 01/01/23 0000 03/29/23 0000  WBC 4.4 3.9 4.2  NEUTROABS  --  2,266.00  --   HGB 11.7* 10.6* 11.8*  HCT 36* 33* 36*  PLT 158 187 199   Lab Results  Component Value Date   TSH 3.06 03/29/2023   Lab Results  Component Value Date   HGBA1C 5.6 07/01/2016   Lab Results  Component Value Date   CHOL 103 11/09/2022   HDL 46 11/09/2022   LDLCALC 39 11/09/2022   LDLDIRECT 159.3 05/17/2007   TRIG  99 11/09/2022   CHOLHDL 2 01/28/2021    Significant Diagnostic Results in last 30 days:  No results found.  Assessment/Plan GERD reported pain and tenderness in substernal area, had episode of vomiting 08/09/23. The patient stated he has heartburns sometimes, but denied pain, heartburns, indigestion, or vomiting upon today's visit.  GERD vs angina, adding PPI Omeprazole 20mg  every day, may try NTG as needed as well.   Dementia, senile with depression, with behavioral disturbance (HCC) reported occasional agitation, yelling, combative, refusing medication, better since Depakote , Seroquel               Depression/anxiety, takes Depakote , Lexapro, Seroquel   Essential hypertension  Blood pressure is controlled,  off Metoprolol , Bun/creat 16/1.0 04/18/23  Hypothyroidism  taking Levothyroxine , TSH 3.06 03/29/23  Ureteral stricture, left Hx of prostate cancer, s/p TURP 2019, L ureter obstruction/stricture, s/p stent exchange, on Lupron  q3 months, followed by Urology  Anemia due to multiple mechanisms On Fe, Fe 67, Vit B12 >2000, Hgb 11.8 03/29/23,   Vit B12 deficiency, on supplement  HLD (hyperlipidemia) HLD LDL 39 11/09/22, takes Atovastatin.      Family/ staff Communication: plan of care reviewed with the patient and charge nurse.  Labs/tests ordered:  CBC/diff, CMP/eGFR, lipids, TSH, lipase.

## 2023-08-11 NOTE — Assessment & Plan Note (Signed)
 reported occasional agitation, yelling, combative, refusing medication, better since Depakote , Seroquel               Depression/anxiety, takes Depakote , Lexapro, Seroquel 

## 2023-08-11 NOTE — Assessment & Plan Note (Signed)
 On Fe, Fe 67, Vit B12 >2000, Hgb 11.8 03/29/23,   Vit B12 deficiency, on supplement

## 2023-08-11 NOTE — Assessment & Plan Note (Signed)
 Blood pressure is controlled, off Metoprolol , Bun/creat 16/1.0 04/18/23

## 2023-08-11 NOTE — Assessment & Plan Note (Signed)
 Hx of prostate cancer, s/p TURP 2019, L ureter obstruction/stricture, s/p stent exchange, on Lupron q3 months, followed by Urology

## 2023-08-11 NOTE — Assessment & Plan Note (Signed)
 HLD LDL 39 11/09/22, takes Atovastatin.

## 2023-08-12 ENCOUNTER — Encounter: Payer: Self-pay | Admitting: Nurse Practitioner

## 2023-08-18 DIAGNOSIS — I10 Essential (primary) hypertension: Secondary | ICD-10-CM | POA: Diagnosis not present

## 2023-08-18 DIAGNOSIS — E039 Hypothyroidism, unspecified: Secondary | ICD-10-CM | POA: Diagnosis not present

## 2023-08-18 DIAGNOSIS — E785 Hyperlipidemia, unspecified: Secondary | ICD-10-CM | POA: Diagnosis not present

## 2023-08-18 LAB — BASIC METABOLIC PANEL WITH GFR
BUN: 20 (ref 4–21)
CO2: 30 — AB (ref 13–22)
Chloride: 106 (ref 99–108)
Creatinine: 1 (ref 0.6–1.3)
Glucose: 85
Potassium: 4.3 meq/L (ref 3.5–5.1)
Sodium: 140 (ref 137–147)

## 2023-08-18 LAB — HEPATIC FUNCTION PANEL
ALT: 24 U/L (ref 10–40)
AST: 18 (ref 14–40)
Alkaline Phosphatase: 51 (ref 25–125)
Bilirubin, Total: 0.3

## 2023-08-18 LAB — LIPID PANEL
Cholesterol: 96 (ref 0–200)
HDL: 35 (ref 35–70)
LDL Cholesterol: 43
LDl/HDL Ratio: 2.7
Triglycerides: 99 (ref 40–160)

## 2023-08-18 LAB — TSH: TSH: 2.75 (ref 0.41–5.90)

## 2023-08-18 LAB — CBC AND DIFFERENTIAL
HCT: 33 — AB (ref 41–53)
Hemoglobin: 10.4 — AB (ref 13.5–17.5)
Platelets: 273 K/uL (ref 150–400)
WBC: 4.5

## 2023-08-18 LAB — COMPREHENSIVE METABOLIC PANEL WITH GFR
Albumin: 2.7 — AB (ref 3.5–5.0)
Calcium: 8.3 — AB (ref 8.7–10.7)
Globulin: 2.4
eGFR: 73

## 2023-08-18 LAB — CBC: RBC: 3.29 — AB (ref 3.87–5.11)

## 2023-08-23 ENCOUNTER — Encounter: Payer: Self-pay | Admitting: Nurse Practitioner

## 2023-08-23 ENCOUNTER — Non-Acute Institutional Stay (SKILLED_NURSING_FACILITY): Admitting: Nurse Practitioner

## 2023-08-23 DIAGNOSIS — F0393 Unspecified dementia, unspecified severity, with mood disturbance: Secondary | ICD-10-CM | POA: Diagnosis not present

## 2023-08-23 DIAGNOSIS — N135 Crossing vessel and stricture of ureter without hydronephrosis: Secondary | ICD-10-CM | POA: Diagnosis not present

## 2023-08-23 DIAGNOSIS — M159 Polyosteoarthritis, unspecified: Secondary | ICD-10-CM

## 2023-08-23 DIAGNOSIS — I10 Essential (primary) hypertension: Secondary | ICD-10-CM

## 2023-08-23 DIAGNOSIS — E782 Mixed hyperlipidemia: Secondary | ICD-10-CM | POA: Diagnosis not present

## 2023-08-23 DIAGNOSIS — K219 Gastro-esophageal reflux disease without esophagitis: Secondary | ICD-10-CM

## 2023-08-23 DIAGNOSIS — E039 Hypothyroidism, unspecified: Secondary | ICD-10-CM | POA: Diagnosis not present

## 2023-08-23 DIAGNOSIS — I2510 Atherosclerotic heart disease of native coronary artery without angina pectoris: Secondary | ICD-10-CM

## 2023-08-23 DIAGNOSIS — D6489 Other specified anemias: Secondary | ICD-10-CM

## 2023-08-23 DIAGNOSIS — F03918 Unspecified dementia, unspecified severity, with other behavioral disturbance: Secondary | ICD-10-CM | POA: Diagnosis not present

## 2023-08-23 NOTE — Assessment & Plan Note (Signed)
 On Fe, Fe 67, Vit B12 >2000, Hgb 10.4 08/18/23

## 2023-08-23 NOTE — Assessment & Plan Note (Signed)
 Blood pressure is controlled,  off Metoprolol , Bun/creat 20/0.97 08/17/24

## 2023-08-23 NOTE — Assessment & Plan Note (Signed)
 reported occasional agitation, yelling, combative, refusing medication, better since Depakote , Seroquel               Depression/anxiety, takes Depakote , Lexapro, Seroquel 

## 2023-08-23 NOTE — Assessment & Plan Note (Signed)
 stable, on Omeprazole. Hgb 10.4, lipase 31 08/18/23

## 2023-08-23 NOTE — Assessment & Plan Note (Signed)
 CAD/unstable angina, s/p heart surgery, on Atorvastatin , prn NTG

## 2023-08-23 NOTE — Assessment & Plan Note (Signed)
 w/c for mobility, pivot to transfer with assistance, takes Tylenol.

## 2023-08-23 NOTE — Assessment & Plan Note (Signed)
 takes Atovastatin. LDL 43 08/18/23

## 2023-08-23 NOTE — Assessment & Plan Note (Signed)
 taking Levothyroxine , TSH 2.74 08/18/23

## 2023-08-23 NOTE — Assessment & Plan Note (Signed)
 Hx of prostate cancer, s/p TURP 2019, L ureter obstruction/stricture, s/p stent exchange, on Lupron q3 months, followed by Urology

## 2023-08-23 NOTE — Progress Notes (Signed)
 Location:   SNF [facility] Room Number: 16 Place of Service:  SNF (31) Provider: [facility] Claron Rosencrans NP  Sherlynn Madden, MD  Patient Care Team: Sherlynn Madden, MD as PCP - General (Internal Medicine) Liane Sharyne MATSU, [facility] (Inactive) as Pharmacist (Pharmacist) Charlanne Fredia CROME, MD as Consulting Physician (Internal Medicine)  Extended Emergency Contact Information Primary Emergency Contact: Bitting,Gerry Address: [address]          [address]          [place], [place] [address] [place] Home Phone: [phone] Relation: Spouse Secondary Emergency Contact: Shuffield,Larry Address: [address]          [place], [place] [address] [place] Phone: [phone] Relation: Son  Code Status:  DNR Goals of care: Advanced Directive information    08/11/2023   10:42 AM  Advanced Directives  Does Patient Have a Medical Advance Directive? Yes  Type of Estate agent of [place];Living will;Out of facility DNR (pink MOST or yellow form)  Does patient want to make changes to medical advance directive? No - Patient declined  Copy of Healthcare Power of Attorney in Chart? Yes - validated most recent copy scanned in chart (See row information)     Chief Complaint  Patient presents with   Medical Management of Chronic Issues    HPI:  Pt is a 89 y.o. male seen today for medical management of chronic diseases.    GERD, stable, on Omeprazole. Hgb 10.4, lipase 31 08/18/23  Dementia, reported occasional agitation, yelling, combative, refusing medication, better since Depakote , Seroquel               Depression/anxiety, takes Depakote , Lexapro, Seroquel              HTN, off Metoprolol , Bun/creat 20/0.97 08/17/24             Hypothyroidism, taking Levothyroxine , TSH 2.74 08/18/23             Hx of prostate cancer, s/p TURP 2019, L ureter obstruction/stricture, s/p stent exchange, on Lupron  q3 months, followed by Urology              IDA On Fe, Fe 67, Vit B12 >2000, Hgb 10.4 08/18/23             Vit B12 deficiency, on supplement             HLD takes Atovastatin. LDL 43 08/18/23             OA w/c for mobility, pivot to transfer with assistance, takes Tylenol .              CAD/unstable angina, s/p heart surgery, on Atorvastatin , prn NTG        Past Medical History:  Diagnosis Date   Complication of anesthesia    serious cognisance problems since my OR in January (03/13/2013   Coronary artery disease    Diverticulosis    Hiatal hernia    Hx of echocardiogram    Echo 5/16:  Mild focal basal septal hypertrophy, EF 55%, mild AI, MV repair ok with trivial MR (mean 3 mmHg), mild LAE, mild RAE, PASP 35 mmHg   Hypertension    Hypothyroidism    Irritable bowel syndrome    PAF (paroxysmal atrial fibrillation) (HCC) 05/03/2014   Personal history of colonic polyps    tubular adenoma   Pleural effusion, bilateral 03/12/2013   Small to moderate, L>R   Prostate cancer (HCC) 1991  sees Dr. Narvis at [facility], observation only    S/P CABG x 2 02/28/2013   LIMA to LAD, SVG to OM1, EVH via right thigh   S/P Maze operation for atrial fibrillation 02/28/2013   Complete bilateral atrial lesion set using bipolar radiofrequency and cryothermy ablation with clipping of LA appendage   S/P mitral valve repair, maze procedure, and CABG x2 02/28/2013   Complex valvuloplasty including triangular resection of posterior leaflet, 30 mm Sorin Memo 3D ring annuloplasty   Past Surgical History:  Procedure Laterality Date   CARDIAC CATHETERIZATION  2015   CATARACT EXTRACTION, BILATERAL Bilateral 2014   per Dr. Roz    COLONOSCOPY  2011   per Dr. Jakie, benign polyps, no repeats needed    CORONARY ARTERY BYPASS GRAFT N/A 02/28/2013   Procedure: CORONARY ARTERY BYPASS GRAFTING (CABG);  Surgeon: Sudie VEAR Laine, MD;  Location: [facility] OR;  Service: Open Heart Surgery;  Laterality: N/A;  CABG x 2,  using left internal mammary artery and right leg  saphenous vein harvested endoscopically   ESOPHAGOGASTRODUODENOSCOPY (EGD) WITH PROPOFOL  N/A 01/11/2019   Procedure: ESOPHAGOGASTRODUODENOSCOPY (EGD) WITH PROPOFOL ;  Surgeon: Legrand Victory LITTIE DOUGLAS, MD;  Location: [facility] ENDOSCOPY;  Service: Gastroenterology;  Laterality: N/A;   HEMORRHOID SURGERY     INTRAOPERATIVE TRANSESOPHAGEAL ECHOCARDIOGRAM N/A 02/28/2013   Procedure: INTRAOPERATIVE TRANSESOPHAGEAL ECHOCARDIOGRAM;  Surgeon: Sudie VEAR Laine, MD;  Location[facility] OR;  Service: Open Heart Surgery;  Laterality: N/A;   LEFT HEART CATHETERIZATION WITH CORONARY ANGIOGRAM N/A 02/26/2013   Procedure: LEFT HEART CATHETERIZATION WITH CORONARY ANGIOGRAM;  Surgeon: Alm LELON Clay, MD;  Location: [facility] CATH LAB;  Service: Cardiovascular;  Laterality: N/A;   MAZE N/A 02/28/2013   Procedure: MAZE;  Surgeon: Sudie VEAR Laine, MD;  Location: [facility] OR;  Service: Open Heart Surgery;  Laterality: N/A;   MITRAL VALVE REPAIR N/A 02/28/2013   Procedure: MITRAL VALVE REPAIR (MVR);  Surgeon: Sudie VEAR Laine, MD;  Location: [facility] OR;  Service: Open Heart Surgery;  Laterality: N/A;   PROSTATE BIOPSY     TRANSURETHRAL RESECTION OF PROSTATE N/A 02/04/2017   Procedure: TRANSURETHRAL RESECTION OF THE PROSTATE (TURP);  Surgeon: Alvaro Hummer, MD;  Location: [facility] ORS;  Service: Urology;  Laterality: N/A;    Allergies  Allergen Reactions   Other Other (See Comments)    ANESTHESIA.... 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 ANESTHESIA.... 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 Occurred post op CABG 2015, required readmission and rehab    Allergies as of 08/23/2023       Reactions   Other Other (See Comments)   ANESTHESIA.... 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 ANESTHESIA.... 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 Occurred post op CABG 2015, required readmission and rehab        Medication List        Accurate as of August 23, 2023 11:21 AM. If you have any  questions, ask your nurse or doctor.          acetaminophen  325 MG tablet Commonly known as: TYLENOL  Take 650 mg by mouth every 4 (four) hours as needed.   acetaminophen  500 MG tablet Commonly known as: TYLENOL  Take 2 tablets (1,000 mg total) by mouth 2 (two) times daily as needed.   atorvastatin  10 MG tablet Commonly known as: LIPITOR TAKE 1 TABLET EVERY DAY AT 6PM   dextromethorphan-guaiFENesin 10-100 MG/5ML liquid Commonly known as: ROBITUSSIN-DM Take 10 mLs by mouth every 6 (six) hours  as needed for cough.   divalproex  125 MG DR tablet Commonly known as: DEPAKOTE  Take 125 mg by mouth at bedtime.   escitalopram 20 MG tablet Commonly known as: LEXAPRO Take 20 mg by mouth daily.   ferrous sulfate  325 (65 FE) MG EC tablet TAKE 1 TABLET BY MOUTH EVERY DAY   fish oil-omega-3 fatty acids  1000 MG capsule Take 1 g by mouth every morning.   hydrocortisone cream 1 % Apply 1 Application topically as needed. For rash on right cheek   lactose free nutrition Liqd Take 237 mLs by mouth 3 (three) times daily.   leuprolide  22.5 MG injection Commonly known as: LUPRON  Inject 22.5 mg into the muscle every 3 (three) months. On the 22nd for malignant neoplasm of prostate   levothyroxine  50 MCG tablet Commonly known as: SYNTHROID  TAKE 1 TABLET BY MOUTH IN THE MORNING   loperamide  2 MG tablet Commonly known as: IMODIUM  A-D Take 4 mg by mouth as needed for diarrhea or loose stools (Max 16 mg in 24 hours). . If >3 loose stools in 24hrs, hold all laxatives/stool softeners. If not effective, notify MD. Do Not give if patient has fever, recent history of CDiff, on an antibiotic,recent use of a   nitroGLYCERIN  0.4 MG SL tablet Commonly known as: NITROSTAT  Place 1 tablet (0.4 mg total) under the tongue every 5 (five) minutes as needed for chest pain.   nystatin  cream Commonly known as: MYCOSTATIN  Apply 1 Application topically 2 (two) times daily. Apply to penis and scrotum for  reddened areas until healed.   QUEtiapine  25 MG tablet Commonly known as: SEROquel  Take 1 tablet (25 mg total) by mouth daily in the afternoon. Give at 2 pm   simethicone 80 MG chewable tablet Commonly known as: MYLICON Chew 80 mg by mouth every 12 (twelve) hours as needed for flatulence.        Review of Systems  Unable to perform ROS: Dementia    Immunization History  Administered Date(s) Administered   Fluad Quad(high Dose 65+) 11/21/2018, 12/25/2019, 01/02/2021, 12/16/2021, 11/24/2022   Influenza Split 12/15/2006, 12/06/2007, 12/31/2009, 11/01/2011   Influenza Whole 12/15/2006, 12/06/2007, 12/31/2009   Influenza, High Dose Seasonal PF 11/11/2014, 11/12/2015, 11/15/2016, 11/18/2017, 11/21/2018, 01/02/2021   Influenza,inj,Quad PF,6+ Mos 11/01/2012, 11/09/2013   Influenza-Unspecified 11/01/2011, 11/01/2012, 11/09/2013, 11/24/2022   Moderna Covid-19 Vaccine Bivalent Booster 21yrs & up 12/08/2022   Moderna SARS-COV2 Booster Vaccination 01/07/2020   Moderna Sars-Covid-2 Vaccination 02/26/2019, 03/26/2019   PFIZER(Purple Top)SARS-COV-2 Vaccination 12/29/2021   PPD Test 10/06/2022   Pneumococcal Conjugate-13 11/09/2013   Pneumococcal Polysaccharide-23 02/23/2004   Td 02/22/2002   Td (Adult),5 Lf Tetanus Toxid, Preservative Free 02/22/2002   Tdap 01/22/2014   Pertinent  Health Maintenance Due  Topic Date Due   INFLUENZA VACCINE  09/23/2023   Colonoscopy  Discontinued      01/25/2022    1:55 PM 02/05/2022    9:54 AM 05/31/2022    3:57 PM 08/11/2022    2:32 PM 12/29/2022    1:38 PM  Fall Risk  Falls in the past year? 1 1 1  0 1  Was there an injury with Fall? 0 0 0 0 0  Fall Risk Category Calculator 2 2 2  0 2  Fall Risk Category (Retired) Moderate  Moderate      (RETIRED) Patient Fall Risk Level Moderate fall risk  Moderate fall risk      Patient at Risk for Falls Due to History of fall(s);Impaired balance/gait;Impaired mobility History of fall(s);Impaired  balance/gait;Impaired mobility  History of fall(s);Impaired balance/gait;Impaired mobility;Mental status change History of fall(s);Impaired balance/gait;Impaired mobility;Mental status change History of fall(s);Impaired balance/gait;Impaired mobility  Fall risk Follow up Falls evaluation completed  Falls evaluation completed  Falls evaluation completed;Education provided;Falls prevention discussed Falls evaluation completed;Education provided;Falls prevention discussed Falls evaluation completed;Education provided;Falls prevention discussed     Data saved with a previous flowsheet row definition   Functional Status Survey:    Vitals:   08/23/23 1112  BP: (!) 143/77  Pulse: 77  Resp: 16  Temp: (!) 97.2 F (36.2 C)  SpO2: 93%  Weight: 141 lb (64 kg)   Body mass index is 20.82 kg/m. Physical Exam Vitals and nursing note reviewed.  Constitutional:      Appearance: Normal appearance.  HENT:     Head: Normocephalic and atraumatic.     Nose: Nose normal.     Mouth/Throat:     Mouth: Mucous membranes are moist.   Eyes:     Extraocular Movements: Extraocular movements intact.     Conjunctiva/sclera: Conjunctivae normal.     Pupils: Pupils are equal, round, and reactive to light.    Cardiovascular:     Rate and Rhythm: Normal rate and regular rhythm.     Heart sounds: No murmur heard. Pulmonary:     Effort: Pulmonary effort is normal.     Breath sounds: No rales.  Abdominal:     General: Bowel sounds are normal.     Palpations: Abdomen is soft.     Tenderness: There is no abdominal tenderness.  Genitourinary:    Comments: Excoriated redness in penis and scrotal areas  Musculoskeletal:     Cervical back: Normal range of motion and neck supple.     Right lower leg: No edema.     Left lower leg: No edema.   Skin:    General: Skin is warm and dry.   Neurological:     General: No focal deficit present.     Mental Status: He is alert.     Motor: No weakness.     Gait: Gait  abnormal.     Comments: Oriented to self.   Psychiatric:        Mood and Affect: Mood normal.     Labs reviewed: Recent Labs    09/16/22 0000 11/09/22 0000 03/29/23 0000  NA 138 140 138  K 4.0 4.2 4.1  CL 104 105 102  CO2 29* 27* 28*  BUN 20 19 16   CREATININE 1.1 1.0 1.0  CALCIUM  8.5* 8.6* 8.7   Recent Labs    09/16/22 0000 11/09/22 0000  AST 12* 19  ALT 10 18  ALKPHOS 52 58  ALBUMIN  2.9* 3.2*   Recent Labs    11/09/22 0000 01/01/23 0000 03/29/23 0000  WBC 4.4 3.9 4.2  NEUTROABS  --  2,266.00  --   HGB 11.7* 10.6* 11.8*  HCT 36* 33* 36*  PLT 158 187 199   Lab Results  Component Value Date   TSH 3.06 03/29/2023   Lab Results  Component Value Date   HGBA1C 5.6 07/01/2016   Lab Results  Component Value Date   CHOL 103 11/09/2022   HDL 46 11/09/2022   LDLCALC 39 11/09/2022   LDLDIRECT 159.3 05/17/2007   TRIG 99 11/09/2022   CHOLHDL 2 01/28/2021    Significant Diagnostic Results in last 30 days:  No results found.  Assessment/Plan  Dementia, senile with depression, with behavioral disturbance (HCC)   reported occasional agitation, yelling, combative, refusing medication, better since Depakote ,  Seroquel               Depression/anxiety, takes Depakote , Lexapro, Seroquel   GERD  stable, on Omeprazole. Hgb 10.4, lipase 31 08/18/23  Essential hypertension Blood pressure is controlled,  off Metoprolol , Bun/creat 20/0.97 08/17/24  Hypothyroidism taking Levothyroxine , TSH 2.74 08/18/23  Stricture or kinking of ureter Hx of prostate cancer, s/p TURP 2019, L ureter obstruction/stricture, s/p stent exchange, on Lupron  q3 months, followed by Urology  Anemia due to multiple mechanisms  On Fe, Fe 67, Vit B12 >2000, Hgb 10.4 08/18/23  HLD (hyperlipidemia)  takes Atovastatin. LDL 43 08/18/23  Osteoarthritis, generalized w/c for mobility, pivot to transfer with assistance, takes Tylenol .   CAD (coronary artery disease) CAD/unstable angina, s/p heart  surgery, on Atorvastatin , prn NTG   Family/ staff Communication: Plan of care reviewed with the patient and charge nurse  Labs/tests ordered: None

## 2023-09-13 DIAGNOSIS — E785 Hyperlipidemia, unspecified: Secondary | ICD-10-CM | POA: Diagnosis not present

## 2023-09-13 DIAGNOSIS — I10 Essential (primary) hypertension: Secondary | ICD-10-CM | POA: Diagnosis not present

## 2023-09-13 DIAGNOSIS — Z79899 Other long term (current) drug therapy: Secondary | ICD-10-CM | POA: Diagnosis not present

## 2023-09-13 LAB — HEPATIC FUNCTION PANEL
ALT: 19 U/L (ref 10–40)
AST: 22 (ref 14–40)
Alkaline Phosphatase: 67 (ref 25–125)
Bilirubin, Direct: 0.1 (ref 0.01–0.4)
Bilirubin, Total: 0.5

## 2023-09-13 LAB — BASIC METABOLIC PANEL WITH GFR
BUN: 20 (ref 4–21)
CO2: 29 — AB (ref 13–22)
Chloride: 102 (ref 99–108)
Creatinine: 0.9 (ref 0.6–1.3)
Glucose: 80
Potassium: 4.1 meq/L (ref 3.5–5.1)
Sodium: 139 (ref 137–147)

## 2023-09-13 LAB — COMPREHENSIVE METABOLIC PANEL WITH GFR
Albumin: 3.1 — AB (ref 3.5–5.0)
Calcium: 8.4 — AB (ref 8.7–10.7)
Globulin: 2.8
eGFR: 81

## 2023-09-19 ENCOUNTER — Non-Acute Institutional Stay (SKILLED_NURSING_FACILITY): Admitting: Nurse Practitioner

## 2023-09-19 ENCOUNTER — Encounter: Payer: Self-pay | Admitting: Nurse Practitioner

## 2023-09-19 DIAGNOSIS — K219 Gastro-esophageal reflux disease without esophagitis: Secondary | ICD-10-CM | POA: Diagnosis not present

## 2023-09-19 DIAGNOSIS — F03918 Unspecified dementia, unspecified severity, with other behavioral disturbance: Secondary | ICD-10-CM

## 2023-09-19 DIAGNOSIS — F0393 Unspecified dementia, unspecified severity, with mood disturbance: Secondary | ICD-10-CM | POA: Diagnosis not present

## 2023-09-19 DIAGNOSIS — M159 Polyosteoarthritis, unspecified: Secondary | ICD-10-CM

## 2023-09-19 NOTE — Assessment & Plan Note (Signed)
 stable, on Omeprazole. Hgb 10.4, lipase 31 08/18/23

## 2023-09-19 NOTE — Assessment & Plan Note (Signed)
 reported occasional agitation, yelling, combative, refusing medication, managed with Depakote , Seroquel , Lexapro

## 2023-09-19 NOTE — Assessment & Plan Note (Addendum)
 reported mild bilateral shoulder achy, taking Tylenol  100mg  bid already. No trauma or injury. No deformity or decreased ROM, but c/o left shoulder discomfort with overhead ROM. Will apply Lidocaine  patch on 12hr, off 12hrs, may X-ray if no better.   w/c for mobility, pivot to transfer with assistance, habitual leaning the left arm on armrest in w/c, takes Tylenol .

## 2023-09-19 NOTE — Progress Notes (Signed)
 Location:   [facility] Room Number: 16 Place of Service:  SNF (31) Provider: [facility] Marisha Renier NP  Sherlynn Madden, MD  Patient Care Team: Sherlynn Madden, MD as PCP - General (Internal Medicine) Liane Sharyne MATSU, [facility] (Inactive) as Pharmacist (Pharmacist) Charlanne Fredia CROME, MD as Consulting Physician (Internal Medicine)  Extended Emergency Contact Information Primary Emergency Contact: Briones,Gerry Address: [address]          [address]          [place], [place] [address] [place] Home Phone: [phone] Relation: Spouse Secondary Emergency Contact: Reising,Larry Address: [address]          [place], [place] [address] [place] Phone: [phone] Relation: Son  Code Status: DNR Goals of care: Advanced Directive information    08/11/2023   10:42 AM  Advanced Directives  Does Patient Have a Medical Advance Directive? Yes  Type of Estate agent of [place];Living will;Out of facility DNR (pink MOST or yellow form)  Does patient want to make changes to medical advance directive? No - Patient declined  Copy of Healthcare Power of Attorney in Chart? Yes - validated most recent copy scanned in chart (See row information)     Chief Complaint  Patient presents with   Acute Visit    Left shoulder pain    HPI:  Pt is a 89 y.o. male seen today for an acute visit for reported mild bilateral shoulder achy, taking Tylenol  100mg  bid already. No trauma or injury. No deformity or decreased ROM, but c/o left shoulder discomfort with overhead ROM.  GERD, stable, on Omeprazole. Hgb 10.4, lipase 31 08/18/23             Dementia, reported occasional agitation, yelling, combative, refusing medication, better since Depakote , Seroquel               Depression/anxiety, takes Depakote , Lexapro, Seroquel              HTN, off Metoprolol , Bun/creat 20/0.97 08/17/24             Hypothyroidism, taking Levothyroxine , TSH 2.74  08/18/23             Hx of prostate cancer, s/p TURP 2019, L ureter obstruction/stricture, s/p stent exchange, on Lupron  q3 months, followed by Urology             IDA On Fe, Fe 67, Vit B12 >2000, Hgb 10.4 08/18/23             Vit B12 deficiency, on supplement             HLD takes Atovastatin. LDL 43 08/18/23             OA w/c for mobility, pivot to transfer with assistance, habitual leaning the left arm on armrest in w/c, takes Tylenol .              CAD/unstable angina, s/p heart surgery, on Atorvastatin , prn NTG       Past Medical History:  Diagnosis Date   Complication of anesthesia    serious cognisance problems since my OR in January (03/13/2013   Coronary artery disease    Diverticulosis    Hiatal hernia    Hx of echocardiogram    Echo 5/16:  Mild focal basal septal hypertrophy, EF 55%, mild AI, MV repair ok with trivial MR (mean 3 mmHg), mild LAE, mild RAE, PASP 35 mmHg   Hypertension    Hypothyroidism  Irritable bowel syndrome    PAF (paroxysmal atrial fibrillation) (HCC) 05/03/2014   Personal history of colonic polyps    tubular adenoma   Pleural effusion, bilateral 03/12/2013   Small to moderate, L>R   Prostate cancer [facility]) 1991   sees Dr. Narvis at [facility], observation only    S/P CABG x 2 02/28/2013   LIMA to LAD, SVG to OM1, EVH via right thigh   S/P Maze operation for atrial fibrillation 02/28/2013   Complete bilateral atrial lesion set using bipolar radiofrequency and cryothermy ablation with clipping of LA appendage   S/P mitral valve repair, maze procedure, and CABG x2 02/28/2013   Complex valvuloplasty including triangular resection of posterior leaflet, 30 mm Sorin Memo 3D ring annuloplasty   Past Surgical History:  Procedure Laterality Date   CARDIAC CATHETERIZATION  2015   CATARACT EXTRACTION, BILATERAL Bilateral 2014   per Dr. Roz    COLONOSCOPY  2011   per Dr. Jakie, benign polyps, no repeats needed    CORONARY ARTERY BYPASS GRAFT N/A 02/28/2013    Procedure: CORONARY ARTERY BYPASS GRAFTING (CABG);  Surgeon: Sudie VEAR Laine, MD;  Location: [facility] OR;  Service: Open Heart Surgery;  Laterality: N/A;  CABG x 2,  using left internal mammary artery and right leg saphenous vein harvested endoscopically   ESOPHAGOGASTRODUODENOSCOPY (EGD) WITH PROPOFOL  N/A 01/11/2019   Procedure: ESOPHAGOGASTRODUODENOSCOPY (EGD) WITH PROPOFOL ;  Surgeon: Legrand Victory LITTIE DOUGLAS, MD;  Location: [facility] ENDOSCOPY;  Service: Gastroenterology;  Laterality: N/A;   HEMORRHOID SURGERY     INTRAOPERATIVE TRANSESOPHAGEAL ECHOCARDIOGRAM N/A 02/28/2013   Procedure: INTRAOPERATIVE TRANSESOPHAGEAL ECHOCARDIOGRAM;  Surgeon: Sudie VEAR Laine, MD;  Location: [facility] OR;  Service: Open Heart Surgery;  Laterality: N/A;   LEFT HEART CATHETERIZATION WITH CORONARY ANGIOGRAM N/A 02/26/2013   Procedure: LEFT HEART CATHETERIZATION WITH CORONARY ANGIOGRAM;  Surgeon: Alm LELON Clay, MD;  Location: [facility] CATH LAB;  Service: Cardiovascular;  Laterality: N/A;   MAZE N/A 02/28/2013   Procedure: MAZE;  Surgeon: Sudie VEAR Laine, MD;  Location: [facility] OR;  Service: Open Heart Surgery;  Laterality: N/A;   MITRAL VALVE REPAIR N/A 02/28/2013   Procedure: MITRAL VALVE REPAIR (MVR);  Surgeon: Sudie VEAR Laine, MD;  Location: [facility] OR;  Service: Open Heart Surgery;  Laterality: N/A;   PROSTATE BIOPSY     TRANSURETHRAL RESECTION OF PROSTATE N/A 02/04/2017   Procedure: TRANSURETHRAL RESECTION OF THE PROSTATE (TURP);  Surgeon: Alvaro Hummer, MD;  Location: [facility] ORS;  Service: Urology;  Laterality: N/A;    Allergies  Allergen Reactions   Other Other (See Comments)    ANESTHESIA.... 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 ANESTHESIA.... 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 Occurred post op CABG 2015, required readmission and rehab    Allergies as of 09/19/2023       Reactions   Other Other (See Comments)   ANESTHESIA.... 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 ANESTHESIA.... Gives him  like Austin Goodie Syndrome per family member. Occurred post op CABG 2015, required readmission and rehab        Medication List        Accurate as of September 19, 2023 12:26 PM. If you have any questions, ask your nurse or doctor.          acetaminophen  325 MG tablet Commonly known as: TYLENOL  Take 650 mg by mouth every 4 (four) hours as needed.   acetaminophen  500 MG tablet Commonly known as: TYLENOL  Take 2 tablets (1,000 mg total)  by mouth 2 (two) times daily as needed.   atorvastatin  10 MG tablet Commonly known as: LIPITOR TAKE 1 TABLET EVERY DAY AT 6PM   dextromethorphan-guaiFENesin 10-100 MG/5ML liquid Commonly known as: ROBITUSSIN-DM Take 10 mLs by mouth every 6 (six) hours as needed for cough.   divalproex  125 MG DR tablet Commonly known as: DEPAKOTE  Take 125 mg by mouth at bedtime.   escitalopram 20 MG tablet Commonly known as: LEXAPRO Take 20 mg by mouth daily.   ferrous sulfate  325 (65 FE) MG EC tablet TAKE 1 TABLET BY MOUTH EVERY DAY   fish oil-omega-3 fatty acids  1000 MG capsule Take 1 g by mouth every morning.   hydrocortisone cream 1 % Apply 1 Application topically as needed. For rash on right cheek   lactose free nutrition Liqd Take 237 mLs by mouth 3 (three) times daily.   leuprolide  22.5 MG injection Commonly known as: LUPRON  Inject 22.5 mg into the muscle every 3 (three) months. On the 22nd for malignant neoplasm of prostate   levothyroxine  50 MCG tablet Commonly known as: SYNTHROID  TAKE 1 TABLET BY MOUTH IN THE MORNING   loperamide  2 MG tablet Commonly known as: IMODIUM  A-D Take 4 mg by mouth as needed for diarrhea or loose stools (Max 16 mg in 24 hours). . If >3 loose stools in 24hrs, hold all laxatives/stool softeners. If not effective, notify MD. Do Not give if patient has fever, recent history of CDiff, on an antibiotic,recent use of a   nitroGLYCERIN  0.4 MG SL tablet Commonly known as: NITROSTAT  Place 1 tablet (0.4 mg total)  under the tongue every 5 (five) minutes as needed for chest pain.   nystatin  cream Commonly known as: MYCOSTATIN  Apply 1 Application topically 2 (two) times daily. Apply to penis and scrotum for reddened areas until healed.   QUEtiapine  25 MG tablet Commonly known as: SEROquel  Take 1 tablet (25 mg total) by mouth daily in the afternoon. Give at 2 pm   simethicone 80 MG chewable tablet Commonly known as: MYLICON Chew 80 mg by mouth every 12 (twelve) hours as needed for flatulence.        Review of Systems  Unable to perform ROS: Dementia    Immunization History  Administered Date(s) Administered   Fluad Quad(high Dose 65+) 11/21/2018, 12/25/2019, 01/02/2021, 12/16/2021, 11/24/2022   Influenza Split 12/15/2006, 12/06/2007, 12/31/2009, 11/01/2011   Influenza Whole 12/15/2006, 12/06/2007, 12/31/2009   Influenza, High Dose Seasonal PF 11/11/2014, 11/12/2015, 11/15/2016, 11/18/2017, 11/21/2018, 01/02/2021   Influenza,inj,Quad PF,6+ Mos 11/01/2012, 11/09/2013   Influenza-Unspecified 11/01/2011, 11/01/2012, 11/09/2013, 11/24/2022   Moderna Covid-19 Vaccine Bivalent Booster 45yrs & up 12/08/2022   Moderna SARS-COV2 Booster Vaccination 01/07/2020   Moderna Sars-Covid-2 Vaccination 02/26/2019, 03/26/2019   PFIZER(Purple Top)SARS-COV-2 Vaccination 12/29/2021   PPD Test 10/06/2022   Pneumococcal Conjugate-13 11/09/2013   Pneumococcal Polysaccharide-23 02/23/2004   Td 02/22/2002   Td (Adult),5 Lf Tetanus Toxid, Preservative Free 02/22/2002   Tdap 01/22/2014   Pertinent  Health Maintenance Due  Topic Date Due   INFLUENZA VACCINE  09/23/2023   Colonoscopy  Discontinued      01/25/2022    1:55 PM 02/05/2022    9:54 AM 05/31/2022    3:57 PM 08/11/2022    2:32 PM 12/29/2022    1:38 PM  Fall Risk  Falls in the past year? 1 1 1  0 1  Was there an injury with Fall? 0 0 0 0 0  Fall Risk Category Calculator 2 2 2  0 2  Fall Risk Category (  Retired) Moderate  Moderate      (RETIRED) Patient  Fall Risk Level Moderate fall risk  Moderate fall risk      Patient at Risk for Falls Due to History of fall(s);Impaired balance/gait;Impaired mobility History of fall(s);Impaired balance/gait;Impaired mobility History of fall(s);Impaired balance/gait;Impaired mobility;Mental status change History of fall(s);Impaired balance/gait;Impaired mobility;Mental status change History of fall(s);Impaired balance/gait;Impaired mobility  Fall risk Follow up Falls evaluation completed  Falls evaluation completed  Falls evaluation completed;Education provided;Falls prevention discussed Falls evaluation completed;Education provided;Falls prevention discussed Falls evaluation completed;Education provided;Falls prevention discussed     Data saved with a previous flowsheet row definition   Functional Status Survey:    Vitals:   09/19/23 1150  BP: 110/68  Pulse: 69  Resp: 16  Temp: (!) 97.2 F (36.2 C)  SpO2: 93%  Weight: 135 lb 3.2 oz (61.3 kg)   Body mass index is 19.97 kg/m. Physical Exam Vitals and nursing note reviewed.  Constitutional:      Appearance: Normal appearance.  HENT:     Head: Normocephalic and atraumatic.     Nose: Nose normal.     Mouth/Throat:     Mouth: Mucous membranes are moist.  Eyes:     Extraocular Movements: Extraocular movements intact.     Conjunctiva/sclera: Conjunctivae normal.     Pupils: Pupils are equal, round, and reactive to light.  Cardiovascular:     Rate and Rhythm: Normal rate and regular rhythm.     Heart sounds: No murmur heard. Pulmonary:     Effort: Pulmonary effort is normal.     Breath sounds: No rales.  Abdominal:     General: Bowel sounds are normal.     Palpations: Abdomen is soft.     Tenderness: There is no abdominal tenderness.  Genitourinary:    Comments: Excoriated redness in penis and scrotal areas Musculoskeletal:        General: Tenderness present. No swelling, deformity or signs of injury.     Cervical back: Normal range of  motion and neck supple.     Right lower leg: No edema.     Left lower leg: No edema.     Comments: Left shoulder pain, discomfort in nature with over head ROM  Skin:    General: Skin is warm and dry.  Neurological:     General: No focal deficit present.     Mental Status: He is alert.     Motor: No weakness.     Gait: Gait abnormal.     Comments: Oriented to self.   Psychiatric:        Mood and Affect: Mood normal.     Labs reviewed: Recent Labs    11/09/22 0000 03/29/23 0000  NA 140 138  K 4.2 4.1  CL 105 102  CO2 27* 28*  BUN 19 16  CREATININE 1.0 1.0  CALCIUM  8.6* 8.7   Recent Labs    11/09/22 0000  AST 19  ALT 18  ALKPHOS 58  ALBUMIN  3.2*   Recent Labs    11/09/22 0000 01/01/23 0000 03/29/23 0000  WBC 4.4 3.9 4.2  NEUTROABS  --  2,266.00  --   HGB 11.7* 10.6* 11.8*  HCT 36* 33* 36*  PLT 158 187 199   Lab Results  Component Value Date   TSH 3.06 03/29/2023   Lab Results  Component Value Date   HGBA1C 5.6 07/01/2016   Lab Results  Component Value Date   CHOL 103 11/09/2022   HDL 46 11/09/2022  LDLCALC 39 11/09/2022   LDLDIRECT 159.3 05/17/2007   TRIG 99 11/09/2022   CHOLHDL 2 01/28/2021    Significant Diagnostic Results in last 30 days:  No results found.  Assessment/Plan: Osteoarthritis, generalized reported mild bilateral shoulder achy, taking Tylenol  100mg  bid already. No trauma or injury. No deformity or decreased ROM, but c/o left shoulder discomfort with overhead ROM. Will apply Lidocaine  patch on 12hr, off 12hrs, may X-ray if no better.   w/c for mobility, pivot to transfer with assistance, habitual leaning the left arm on armrest in w/c, takes Tylenol .   GERD stable, on Omeprazole. Hgb 10.4, lipase 31 08/18/23  Dementia, senile with depression, with behavioral disturbance (HCC) reported occasional agitation, yelling, combative, refusing medication, managed with Depakote , Seroquel , Lexapro    Family/ staff Communication:  Plan of care reviewed with the patient and charge nurse  Labs/tests ordered: None

## 2023-11-03 ENCOUNTER — Encounter: Payer: Self-pay | Admitting: Nurse Practitioner

## 2023-11-03 ENCOUNTER — Non-Acute Institutional Stay (SKILLED_NURSING_FACILITY): Payer: Self-pay | Admitting: Nurse Practitioner

## 2023-11-03 DIAGNOSIS — R339 Retention of urine, unspecified: Secondary | ICD-10-CM | POA: Diagnosis not present

## 2023-11-03 DIAGNOSIS — E782 Mixed hyperlipidemia: Secondary | ICD-10-CM

## 2023-11-03 DIAGNOSIS — D6489 Other specified anemias: Secondary | ICD-10-CM | POA: Diagnosis not present

## 2023-11-03 DIAGNOSIS — I2510 Atherosclerotic heart disease of native coronary artery without angina pectoris: Secondary | ICD-10-CM

## 2023-11-03 DIAGNOSIS — I10 Essential (primary) hypertension: Secondary | ICD-10-CM

## 2023-11-03 DIAGNOSIS — F03918 Unspecified dementia, unspecified severity, with other behavioral disturbance: Secondary | ICD-10-CM

## 2023-11-03 DIAGNOSIS — F0393 Unspecified dementia, unspecified severity, with mood disturbance: Secondary | ICD-10-CM | POA: Diagnosis not present

## 2023-11-03 DIAGNOSIS — K219 Gastro-esophageal reflux disease without esophagitis: Secondary | ICD-10-CM | POA: Diagnosis not present

## 2023-11-03 DIAGNOSIS — E538 Deficiency of other specified B group vitamins: Secondary | ICD-10-CM

## 2023-11-03 NOTE — Progress Notes (Signed)
 Location:  [facility] Room Number: N016-A Place of Service:  SNF (31) Provider:  Vantasia Pinkney X, NP    Patient Care Team: Sherlynn Madden, MD as PCP - General (Internal Medicine) Liane Sharyne MATSU, [facility] (Inactive) as Pharmacist (Pharmacist) Charlanne Fredia CROME, MD as Consulting Physician (Internal Medicine)  Extended Emergency Contact Information Primary Emergency Contact: Murakami,Gerry Address: [address]          [address]          [place], [place] [address] [place] Home Phone: [phone] Relation: Spouse Secondary Emergency Contact: Urbanowicz,Larry Address: [address]          [place], [place] [address] [place] Phone: [phone] Relation: Son  Code Status:  DNR Goals of care: Advanced Directive information    11/11/2023    1:57 AM  Advanced Directives  Does Patient Have a Medical Advance Directive? Unable to assess, patient is non-responsive or altered mental status     Chief Complaint  Patient presents with   Medical Management of Chronic Issues    Routine Visit, need to discuss Medicare Annual Wellness, flu, Covid, and shingles vaccine.    HPI:  Pt is a 89 y.o. male seen today for medical management of chronic diseases.    GERD, stable, on Omeprazole. Hgb 10.4, lipase 31 08/18/23             Dementia, reported occasional agitation, yelling, combative, refusing medication, better since Depakote , Seroquel               Depression/anxiety, takes Depakote , Lexapro, Seroquel              HTN, off Metoprolol , Bun/creat 20/0.9 09/13/23             Hypothyroidism, taking Levothyroxine , TSH 2.74 08/18/23             Hx of prostate cancer, s/p TURP 2019, L ureter obstruction/stricture, s/p stent exchange, on Lupron  q3 months, followed by Urology             IDA On Fe, Fe 67, Vit B12 >2000, Hgb 10.4 08/18/23             Vit B12 deficiency, on supplement             HLD takes Atovastatin. LDL 43 08/18/23             OA  w/c for mobility, pivot to transfer with assistance, habitual leaning the left arm on armrest in w/c, takes Tylenol .              CAD/unstable angina, s/p heart surgery, on Atorvastatin , prn NTG    Past Medical History:  Diagnosis Date   Complication of anesthesia    serious cognisance problems since my OR in January (03/13/2013   Coronary artery disease    Diverticulosis    Hiatal hernia    Hx of echocardiogram    Echo 5/16:  Mild focal basal septal hypertrophy, EF 55%, mild AI, MV repair ok with trivial MR (mean 3 mmHg), mild LAE, mild RAE, PASP 35 mmHg   Hypertension    Hypothyroidism    Irritable bowel syndrome    PAF (paroxysmal atrial fibrillation) (HCC) 05/03/2014   Personal history of colonic polyps    tubular adenoma   Pleural effusion, bilateral 03/12/2013   Small to moderate, L>R   Prostate cancer [facility]) 1991   sees Dr. Narvis at [facility], observation only    S/P CABG  x 2 02/28/2013   LIMA to LAD, SVG to OM1, EVH via right thigh   S/P Maze operation for atrial fibrillation 02/28/2013   Complete bilateral atrial lesion set using bipolar radiofrequency and cryothermy ablation with clipping of LA appendage   S/P mitral valve repair, maze procedure, and CABG x2 02/28/2013   Complex valvuloplasty including triangular resection of posterior leaflet, 30 mm Sorin Memo 3D ring annuloplasty   Past Surgical History:  Procedure Laterality Date   CARDIAC CATHETERIZATION  2015   CATARACT EXTRACTION, BILATERAL Bilateral 2014   per Dr. Roz    COLONOSCOPY  2011   per Dr. Jakie, benign polyps, no repeats needed    CORONARY ARTERY BYPASS GRAFT N/A 02/28/2013   Procedure: CORONARY ARTERY BYPASS GRAFTING (CABG);  Surgeon: Sudie VEAR Laine, MD;  Location: [facility] OR;  Service: Open Heart Surgery;  Laterality: N/A;  CABG x 2,  using left internal mammary artery and right leg saphenous vein harvested endoscopically   ESOPHAGOGASTRODUODENOSCOPY N/A 11/12/2023   Procedure: EGD  (ESOPHAGOGASTRODUODENOSCOPY);  Surgeon: Elicia Claw, MD;  Location: [facility];  Service: Gastroenterology;  Laterality: N/A;   ESOPHAGOGASTRODUODENOSCOPY (EGD) WITH PROPOFOL  N/A 01/11/2019   Procedure: ESOPHAGOGASTRODUODENOSCOPY (EGD) WITH PROPOFOL ;  Surgeon: Legrand Victory LITTIE DOUGLAS, MD;  Location: [facility];  Service: Gastroenterology;  Laterality: N/A;   HEMORRHOID SURGERY     INTRAOPERATIVE TRANSESOPHAGEAL ECHOCARDIOGRAM N/A 02/28/2013   Procedure: INTRAOPERATIVE TRANSESOPHAGEAL ECHOCARDIOGRAM;  Surgeon: Sudie VEAR Laine, MD;  Location: [facility] OR;  Service: Open Heart Surgery;  Laterality: N/A;   LEFT HEART CATHETERIZATION WITH CORONARY ANGIOGRAM N/A 02/26/2013   Procedure: LEFT HEART CATHETERIZATION WITH CORONARY ANGIOGRAM;  Surgeon: Alm LELON Clay, MD;  Location: [facility] CATH LAB;  Service: Cardiovascular;  Laterality: N/A;   MAZE N/A 02/28/2013   Procedure: MAZE;  Surgeon: Sudie VEAR Laine, MD;  Location: [facility] OR;  Service: Open Heart Surgery;  Laterality: N/A;   MITRAL VALVE REPAIR N/A 02/28/2013   Procedure: MITRAL VALVE REPAIR (MVR);  Surgeon: Sudie VEAR Laine, MD;  Location: [facility] OR;  Service: Open Heart Surgery;  Laterality: N/A;   PROSTATE BIOPSY     TRANSURETHRAL RESECTION OF PROSTATE N/A 02/04/2017   Procedure: TRANSURETHRAL RESECTION OF THE PROSTATE (TURP);  Surgeon: Alvaro Hummer, MD;  Location: [facility] ORS;  Service: Urology;  Laterality: N/A;    Allergies  Allergen Reactions   Other Other (See Comments)    ANESTHESIA.... 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 ANESTHESIA.... 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 Occurred post op CABG 2015, required readmission and rehab    No facility-administered encounter medications on file as of 11/03/2023.   Outpatient Encounter Medications as of 11/03/2023  Medication Sig   acetaminophen  (TYLENOL ) 325 MG tablet Take 650 mg by mouth every 4 (four) hours as needed.   acetaminophen  (TYLENOL ) 500 MG tablet Take 2  tablets (1,000 mg total) by mouth 2 (two) times daily as needed.   amoxicillin (AMOXIL) 500 MG capsule Take 1,000 mg by mouth 2 (two) times daily.   atorvastatin  (LIPITOR) 10 MG tablet TAKE 1 TABLET EVERY DAY AT 6PM   dextromethorphan-guaiFENesin (ROBITUSSIN-DM) 10-100 MG/5ML liquid Take 10 mLs by mouth every 6 (six) hours as needed for cough.   divalproex  (DEPAKOTE ) 125 MG DR tablet Take 125 mg by mouth at bedtime.   escitalopram (LEXAPRO) 20 MG tablet Take 20 mg by mouth daily.   ferrous sulfate  325 (65 FE) MG EC tablet TAKE 1 TABLET BY MOUTH EVERY DAY   fish  oil-omega-3 fatty acids  1000 MG capsule Take 1 g by mouth every morning.   hydrocortisone cream 1 % Apply 1 Application topically as needed. For rash on right cheek   lactose free nutrition (BOOST) LIQD Take 237 mLs by mouth 3 (three) times daily.   levothyroxine  (SYNTHROID ) 50 MCG tablet TAKE 1 TABLET BY MOUTH IN THE MORNING   loperamide  (IMODIUM  A-D) 2 MG tablet Take 4 mg by mouth as needed for diarrhea or loose stools (Max 16 mg in 24 hours). . If >3 loose stools in 24hrs, hold all laxatives/stool softeners. If not effective, notify MD. Do Not give if patient has fever, recent history of CDiff, on an antibiotic,recent use of a   LORazepam  (ATIVAN ) 2 MG tablet Take 2 mg by mouth at bedtime as needed.   nitroGLYCERIN  (NITROSTAT ) 0.4 MG SL tablet Place 1 tablet (0.4 mg total) under the tongue every 5 (five) minutes as needed for chest pain.   omeprazole (PRILOSEC) 20 MG capsule Take 20 mg by mouth daily.   QUEtiapine  (SEROQUEL ) 25 MG tablet Take 1 tablet (25 mg total) by mouth daily in the afternoon. Give at 2 pm   simethicone  (MYLICON) 80 MG chewable tablet Chew 80 mg by mouth every 12 (twelve) hours as needed for flatulence.   [DISCONTINUED] nystatin  cream (MYCOSTATIN ) Apply 1 Application topically 2 (two) times daily. Apply to penis and scrotum for reddened areas until healed.   [DISCONTINUED] nystatin -triamcinolone  (MYCOLOG II) cream  Apply 1 Application topically 2 (two) times daily.   [DISCONTINUED] leuprolide  (LUPRON ) 22.5 MG injection Inject 22.5 mg into the muscle every 3 (three) months. On the 22nd for malignant neoplasm of prostate (Patient not taking: Reported on 11/03/2023)    Review of Systems  Unable to perform ROS: Dementia    Immunization History  Administered Date(s) Administered   Fluad Quad(high Dose 65+) 11/21/2018, 12/25/2019, 01/02/2021, 12/16/2021, 11/24/2022   INFLUENZA, HIGH DOSE SEASONAL PF 11/11/2014, 11/12/2015, 11/15/2016, 11/18/2017, 11/21/2018, 01/02/2021   Influenza Split 12/15/2006, 12/06/2007, 12/31/2009, 11/01/2011   Influenza Whole 12/15/2006, 12/06/2007, 12/31/2009   Influenza,inj,Quad PF,6+ Mos 11/01/2012, 11/09/2013   Influenza-Unspecified 11/01/2011, 11/01/2012, 11/09/2013, 11/24/2022   Moderna Covid-19 Vaccine Bivalent Booster 104yrs & up 12/08/2022   Moderna SARS-COV2 Booster Vaccination 01/07/2020   Moderna Sars-Covid-2 Vaccination 02/26/2019, 03/26/2019   PFIZER(Purple Top)SARS-COV-2 Vaccination 12/29/2021   PPD Test 10/06/2022   Pneumococcal Conjugate-13 11/09/2013   Pneumococcal Polysaccharide-23 02/23/2004   Td 02/22/2002   Td (Adult),5 Lf Tetanus Toxid, Preservative Free 02/22/2002   Tdap 01/22/2014   Pertinent  Health Maintenance Due  Topic Date Due   Influenza Vaccine  09/23/2023   Colonoscopy  Discontinued      01/25/2022    1:55 PM 02/05/2022    9:54 AM 05/31/2022    3:57 PM 08/11/2022    2:32 PM 12/29/2022    1:38 PM  Fall Risk  Falls in the past year? 1 1 1  0 1  Was there an injury with Fall? 0 0 0 0 0  Fall Risk Category Calculator 2 2 2  0 2  Fall Risk Category (Retired) Moderate  Moderate      (RETIRED) Patient Fall Risk Level Moderate fall risk  Moderate fall risk      Patient at Risk for Falls Due to History of fall(s);Impaired balance/gait;Impaired mobility History of fall(s);Impaired balance/gait;Impaired mobility History of fall(s);Impaired  balance/gait;Impaired mobility;Mental status change History of fall(s);Impaired balance/gait;Impaired mobility;Mental status change History of fall(s);Impaired balance/gait;Impaired mobility  Fall risk Follow up Falls evaluation completed  Falls evaluation  completed  Falls evaluation completed;Education provided;Falls prevention discussed Falls evaluation completed;Education provided;Falls prevention discussed Falls evaluation completed;Education provided;Falls prevention discussed     Data saved with a previous flowsheet row definition   Functional Status Survey:    Vitals:   11/03/23 1037  BP: 135/80  Pulse: 76  Resp: 16  Temp: 97.7 F (36.5 C)  SpO2: 94%  Weight: 135 lb 8 oz (61.5 kg)  Height: 5' 9 (1.753 m)   Body mass index is 20.01 kg/m. Physical Exam Vitals and nursing note reviewed.  Constitutional:      Appearance: Normal appearance.  HENT:     Head: Normocephalic and atraumatic.     Nose: Nose normal.     Mouth/Throat:     Mouth: Mucous membranes are moist.  Eyes:     Extraocular Movements: Extraocular movements intact.     Conjunctiva/sclera: Conjunctivae normal.     Pupils: Pupils are equal, round, and reactive to light.  Cardiovascular:     Rate and Rhythm: Normal rate and regular rhythm.     Heart sounds: No murmur heard. Pulmonary:     Effort: Pulmonary effort is normal.     Breath sounds: No rales.  Abdominal:     General: Bowel sounds are normal.     Palpations: Abdomen is soft.     Tenderness: There is no abdominal tenderness.  Genitourinary:    Comments: Excoriated redness in penis and scrotal areas Musculoskeletal:        General: Tenderness present. No swelling, deformity or signs of injury.     Cervical back: Normal range of motion and neck supple.     Right lower leg: No edema.     Left lower leg: No edema.     Comments: Left shoulder pain, discomfort in nature with over head ROM  Skin:    General: Skin is warm and dry.  Neurological:      General: No focal deficit present.     Mental Status: He is alert.     Motor: No weakness.     Gait: Gait abnormal.     Comments: Oriented to self.   Psychiatric:        Mood and Affect: Mood normal.     Labs reviewed: Recent Labs    11/12/23 0950 11/13/23 0246 11/14/23 0524  NA 149* 153* 151*  K 3.9 3.8 2.9*  CL 111 111 114*  CO2 25 21* 23  GLUCOSE 147* 125* 159*  BUN 83* 115* 98*  CREATININE 1.37* 2.23* 2.12*  CALCIUM  8.6* 8.4* 8.2*  MG 2.1 2.3  --   PHOS 3.7 5.3* 3.8   Recent Labs    09/13/23 0000 11/11/23 0217 11/12/23 0247 11/12/23 0950 11/14/23 0524  AST 22 45* 22  --   --   ALT 19 49* 29  --   --   ALKPHOS 67 58 44  --   --   BILITOT  --  0.4 0.3  --   --   PROT  --  6.0* 4.9*  --   --   ALBUMIN  3.1* 3.4* 2.8* 2.8* 2.8*   Recent Labs    11/12/23 0950 11/12/23 1834 11/13/23 0246 11/13/23 1116 11/13/23 1330 11/13/23 2142 11/14/23 0252  WBC 11.6*  --  11.7*  --   --   --  10.0  NEUTROABS 9.9*  --  10.3*  --   --   --  8.4*  HGB 7.4*   < > 6.8*   < > 6.8*  8.3* 8.5*  HCT 24.1*   < > 22.7*   < > 22.3* 26.7* 27.5*  MCV 99.6  --  103.2*  --   --   --  96.5  PLT 197  --  194  --   --   --  176   < > = values in this interval not displayed.   Lab Results  Component Value Date   TSH 2.75 08/18/2023   Lab Results  Component Value Date   HGBA1C 5.6 07/01/2016   Lab Results  Component Value Date   CHOL 96 08/18/2023   HDL 35 08/18/2023   LDLCALC 43 08/18/2023   LDLDIRECT 159.3 05/17/2007   TRIG 99 08/18/2023   CHOLHDL 2 01/28/2021    Significant Diagnostic Results in last 30 days:  CT ANGIO GI BLEED Addendum Date: 11/11/2023 ADDENDUM REPORT: 11/11/2023 05:28 ADDENDUM: #2 was discussed by telephone with PA [facility] SPONSELLER on 11/11/2023 at 05:28. At 0521 hours. Electronically Signed   By: VEAR Hurst M.D.   On: 11/11/2023 05:28   Result Date: 11/11/2023 CLINICAL DATA:  89 year old male with coffee-ground emesis. Chronic intrathoracic stomach. EXAM:  CTA ABDOMEN AND PELVIS WITHOUT AND WITH CONTRAST TECHNIQUE: Multidetector CT imaging of the abdomen and pelvis was performed using the standard protocol during bolus administration of intravenous contrast. Multiplanar reconstructed images and MIPs were obtained and reviewed to evaluate the vascular anatomy. RADIATION DOSE REDUCTION: This exam was performed according to the departmental dose-optimization program which includes automated exposure control, adjustment of the mA and/or kV according to patient size and/or use of iterative reconstruction technique. CONTRAST:  100mL OMNIPAQUE  IOHEXOL  350 MG/ML SOLN COMPARISON:  CT chest today reported separately. CT Abdomen and Pelvis 07/28/2021. FINDINGS: VASCULAR Extensive Aortoiliac calcified atherosclerosis. Normal caliber abdominal aorta. Major arterial structures in the abdomen and pelvis remain patent despite the atherosclerosis. On delayed phase the portal venous system appears to remain patent. There is some mass effect on the splenic vein from the gastric abnormality, but splenic vein remains patent. No active contrast extravasation into bowel lumen is identified on this exam. Review of the MIP images confirms the above findings. NON-VASCULAR Lower chest: Detailed separately today, with dilated and obstructed stomach secondary to a gastric volvulus redemonstrated. Pre contrast, arterial and portal venous phase postcontrast images are provided. The entire intrathoracic portion of the dilated stomach is not included on these images. But no contrast extravasation into the stomach visible stomach is identified. See additional details on chest CT today. Hepatobiliary: Chronic gallstones. No acute liver or gallbladder finding. Pancreas: Architectural distortion from the gastric abnormality, otherwise negative. Spleen: Spleen is stable and within normal limits for age, with mild post granulomatous calcifications and mildly heterogeneous enhancement. No splenomegaly.  Adrenals/Urinary Tract: Negative adrenal glands. Negative right kidney. Chronic left renal double-J ureteral stent, appears stable since 2023. Chronic urinary bladder wall thickening. Stomach/Bowel: Due to gastric volvulus as detailed separately on chest CT today. The compressed gastric outlet and proximal duodenum is redemonstrated on series 18, image 20. From there the duodenum is within normal limits. Nondilated jejunum. Nondilated distal small bowel. Redundant large bowel with diverticulosis and retained stool. But most large bowel loops are decompressed, including the splenic flexure and the transverse colon. Furthermore, the mid transverse colon also appears to be herniated and mildly twisted above the diaphragm in conjunction with the gastric hernia and volvulus. See coronal images 92 (proximal) and 98 (distal). But the upstream right colon is nondilated. No pneumoperitoneum.  No free fluid in the  abdomen. Moderate size stool ball in the rectum, new since 2023. Lymphatic: No lymphadenopathy identified. Reproductive: Negative. Other: Chronic left pelvic sidewall and presacral stranding is stable since 2023. no acute pelvic free fluid. Musculoskeletal: Chronic hyperostosis related thoracic spinal ankylosis. Chronic L5 compression fracture is stable since 2023. Superimposed degenerative and/or posttraumatic lower lumbar facet ankylosis. No acute osseous abnormality identified. IMPRESSION: 1. Acute Gastric Outlet Obstruction secondary to Gastric Volvulus in the setting of chronic large hiatal hernia and intrathoracic stomach. See Chest CT today for additional details of the stomach. 2. NOTE ALSO the midline segment of the Transverse Colon also appears to be herniated and twisted in conjunction with #1. However, there are no dilated or obstructed bowel loops at this time distal to the stomach. No pneumoperitoneum or free fluid. 3. No active gastrointestinal bleeding identified by CTA. 4. No other acute finding in  the abdomen or pelvis. Advanced large bowel diverticulosis. Advanced Aortic Atherosclerosis (ICD10-I70.0). Chronic left renal double-J ureteral stent. Chronic L5 compression fracture. Electronically Signed: By: VEAR Hurst M.D. On: 11/11/2023 05:02   CT Chest W Contrast Addendum Date: 11/11/2023 ADDENDUM REPORT: 11/11/2023 05:08 ADDENDUM: Study discussed by telephone with PA [facility] SPONSELLER on 11/11/2023 at 0452 hours. Electronically Signed   By: VEAR Hurst M.D.   On: 11/11/2023 05:08   Result Date: 11/11/2023 CLINICAL DATA:  89 year old male with coffee-ground emesis. Chronic intrathoracic stomach. EXAM: CT CHEST WITH CONTRAST TECHNIQUE: Multidetector CT imaging of the chest was performed during intravenous contrast administration. RADIATION DOSE REDUCTION: This exam was performed according to the departmental dose-optimization program which includes automated exposure control, adjustment of the mA and/or kV according to patient size and/or use of iterative reconstruction technique. CONTRAST:  OMNIPAQUE  IOHEXOL  350 MG/ML SOLN COMPARISON:  Prior CT Abdomen and Pelvis 07/28/2021. Prior chest CT 03/12/2013. CTA Abdomen and Pelvis today reported separately. FINDINGS: Cardiovascular: Chronic sternotomy, CABG. Chronic atherosclerosis and tortuosity of the thoracic aorta, stable since 2015. Mild cardiomegaly appears increased. No pericardial effusion. Central pulmonary arteries grossly patent. Mediastinum/Nodes: Abnormally dilated gas and fluid containing esophagus which tapers to the gastroesophageal junction. Chronic intrathoracic stomach. New since 07/28/2021 the stomach is diffusely dilated, fluid-filled, and twisted such that a gastric volvulus is present. Previously the gastric antrum was intrathoracic. Now up portion of dilated gas and fluid containing stomach is subdiaphragmatic, and the gastric outlet is severely compressed as seen on series 2, images 54 through 62 and coronal images 54 through 65. There is  a small volume of edema or free fluid in the posterior mesentery. No superimposed mediastinal mass or lymphadenopathy. There are small chronic post granulomatous calcified mediastinal and hilar lymph nodes. Lungs/Pleura: Chronic left lung base architectural distortion from intrathoracic stomach. Central airways remain patent but there is left lower lobe consolidation now in the setting of gastric distension. Additional sub solid peribronchovascular nodularity in the right upper lobe (series 4, images 48 through 62). Contralateral left upper lobe mostly dependent ground-glass opacity more resembles atelectasis. No pneumothorax. Small volume mildly complex layering fluid in the left hemithorax could be in the posterior mediastinum or the pleural space on series 2, image 33. There is no drainable pleural effusion. Upper Abdomen: Dilated gas and fluid containing distal stomach subjacent to the diaphragm now with twisted and compressed gastric outlet (series 2, image 54). No pneumoperitoneum is identified. There is a small volume of left quadrant ascites. Otherwise see CTA Abdomen and Pelvis reported separately. Musculoskeletal: Chronically exaggerated thoracic kyphosis with hyperostosis related widespread thoracic spinal ankylosis. This  is similar to, mildly progressed from the 2015 CT. Chronic sternotomy. No acute or suspicious osseous lesion identified. IMPRESSION: 1. Positive for Acute Gastric Volvulus in the setting of chronic intrathoracic stomach from large chronic hiatal hernia. Deedra this is an organo-axial volvulus. Dilated air and fluid containing stomach and esophagus which - rather than fully intrathoracic as seen in 2023 - distal stomach now is subdiaphragmatic, with twisted, compressed and obstructed gastric outlet (series 2, image 54). 2. Small volume of free fluid in the posterior mediastinum and/or left hemithorax. No pneumomediastinum. No pneumoperitoneum in the visible upper abdomen (see also CTA  Abdomen and Pelvis today reported separately.). 3. Associated early bilateral lung infection with right upper lobe dependent bronchopneumonia and left lower lobe atelectasis versus consolidation. Electronically Signed: By: VEAR Hurst M.D. On: 11/11/2023 04:47    Assessment/Plan Essential hypertension Blood pressure is control,  off Metoprolol , Bun/creat 20/0.9 09/13/23  Hypothyroidism  taking Levothyroxine , TSH 2.74 08/18/23  Urinary retention Incontinent of urine Hx of prostate cancer, s/p TURP 2019, L ureter obstruction/stricture, s/p stent exchange, on Lupron  q3 months, followed by Urology  Anemia due to multiple mechanisms On Fe, Fe 67, Vit B12 >2000, Hgb 10.4 08/18/23  Vitamin B12 deficiency  on supplement  HLD (hyperlipidemia)  takes Atovastatin. LDL 43 08/18/23  CAD (coronary artery disease) No chest pain reported, CAD/unstable angina, s/p heart surgery, on Atorvastatin , prn NTG  Dementia, senile with depression, with behavioral disturbance (HCC) takes Depakote , Lexapro, Seroquel  reported occasional agitation, yelling, combative, refusing medication  GERD stable, on Omeprazole. Hgb 10.4, lipase 31 08/18/23    Family/ staff Communication: plan of care reviewed with the patient and charge nurse.   Labs/tests ordered:  none

## 2023-11-04 ENCOUNTER — Encounter: Payer: Self-pay | Admitting: Nurse Practitioner

## 2023-11-04 NOTE — Assessment & Plan Note (Signed)
 takes Depakote, Lexapro, Seroquel reported occasional agitation, yelling, combative, refusing medication.

## 2023-11-04 NOTE — Assessment & Plan Note (Signed)
 Incontinent of urine Hx of prostate cancer, s/p TURP 2019, L ureter obstruction/stricture, s/p stent exchange, on Lupron  q3 months, followed by Urology

## 2023-11-04 NOTE — Assessment & Plan Note (Signed)
 Blood pressure is control,  off Metoprolol , Bun/creat 20/0.9 09/13/23

## 2023-11-04 NOTE — Assessment & Plan Note (Signed)
on supplement.

## 2023-11-04 NOTE — Assessment & Plan Note (Signed)
 takes Atovastatin. LDL 43 08/18/23

## 2023-11-04 NOTE — Assessment & Plan Note (Signed)
 stable, on Omeprazole. Hgb 10.4, lipase 31 08/18/23

## 2023-11-04 NOTE — Assessment & Plan Note (Signed)
 taking Levothyroxine , TSH 2.74 08/18/23

## 2023-11-04 NOTE — Assessment & Plan Note (Signed)
 No chest pain reported, CAD/unstable angina, s/p heart surgery, on Atorvastatin , prn NTG

## 2023-11-04 NOTE — Assessment & Plan Note (Signed)
 On Fe, Fe 67, Vit B12 >2000, Hgb 10.4 08/18/23

## 2023-11-11 ENCOUNTER — Inpatient Hospital Stay (HOSPITAL_COMMUNITY)
Admission: EM | Admit: 2023-11-11 | Discharge: 2023-11-14 | DRG: 326 | Disposition: A | Discharge status: Hospice | Attending: Internal Medicine | Admitting: Internal Medicine

## 2023-11-11 ENCOUNTER — Other Ambulatory Visit: Payer: Self-pay

## 2023-11-11 ENCOUNTER — Emergency Department (HOSPITAL_COMMUNITY)

## 2023-11-11 DIAGNOSIS — I110 Hypertensive heart disease with heart failure: Secondary | ICD-10-CM | POA: Diagnosis present

## 2023-11-11 DIAGNOSIS — Z681 Body mass index (BMI) 19 or less, adult: Secondary | ICD-10-CM | POA: Diagnosis not present

## 2023-11-11 DIAGNOSIS — Z8249 Family history of ischemic heart disease and other diseases of the circulatory system: Secondary | ICD-10-CM

## 2023-11-11 DIAGNOSIS — R296 Repeated falls: Secondary | ICD-10-CM | POA: Diagnosis not present

## 2023-11-11 DIAGNOSIS — E039 Hypothyroidism, unspecified: Secondary | ICD-10-CM | POA: Diagnosis not present

## 2023-11-11 DIAGNOSIS — E860 Dehydration: Secondary | ICD-10-CM | POA: Diagnosis not present

## 2023-11-11 DIAGNOSIS — E441 Mild protein-calorie malnutrition: Secondary | ICD-10-CM | POA: Diagnosis present

## 2023-11-11 DIAGNOSIS — R739 Hyperglycemia, unspecified: Secondary | ICD-10-CM | POA: Diagnosis present

## 2023-11-11 DIAGNOSIS — I2510 Atherosclerotic heart disease of native coronary artery without angina pectoris: Secondary | ICD-10-CM | POA: Diagnosis not present

## 2023-11-11 DIAGNOSIS — Z860100 Personal history of colon polyps, unspecified: Secondary | ICD-10-CM

## 2023-11-11 DIAGNOSIS — K562 Volvulus: Secondary | ICD-10-CM | POA: Diagnosis not present

## 2023-11-11 DIAGNOSIS — R1084 Generalized abdominal pain: Secondary | ICD-10-CM | POA: Diagnosis not present

## 2023-11-11 DIAGNOSIS — K311 Adult hypertrophic pyloric stenosis: Secondary | ICD-10-CM | POA: Diagnosis not present

## 2023-11-11 DIAGNOSIS — R404 Transient alteration of awareness: Secondary | ICD-10-CM | POA: Diagnosis not present

## 2023-11-11 DIAGNOSIS — Z800 Family history of malignant neoplasm of digestive organs: Secondary | ICD-10-CM

## 2023-11-11 DIAGNOSIS — K8020 Calculus of gallbladder without cholecystitis without obstruction: Secondary | ICD-10-CM | POA: Diagnosis not present

## 2023-11-11 DIAGNOSIS — R933 Abnormal findings on diagnostic imaging of other parts of digestive tract: Secondary | ICD-10-CM | POA: Diagnosis not present

## 2023-11-11 DIAGNOSIS — R7401 Elevation of levels of liver transaminase levels: Secondary | ICD-10-CM | POA: Diagnosis present

## 2023-11-11 DIAGNOSIS — Z833 Family history of diabetes mellitus: Secondary | ICD-10-CM

## 2023-11-11 DIAGNOSIS — F03918 Unspecified dementia, unspecified severity, with other behavioral disturbance: Secondary | ICD-10-CM | POA: Diagnosis present

## 2023-11-11 DIAGNOSIS — Z884 Allergy status to anesthetic agent status: Secondary | ICD-10-CM

## 2023-11-11 DIAGNOSIS — Z743 Need for continuous supervision: Secondary | ICD-10-CM | POA: Diagnosis not present

## 2023-11-11 DIAGNOSIS — K920 Hematemesis: Secondary | ICD-10-CM | POA: Diagnosis not present

## 2023-11-11 DIAGNOSIS — K3189 Other diseases of stomach and duodenum: Secondary | ICD-10-CM | POA: Diagnosis not present

## 2023-11-11 DIAGNOSIS — E870 Hyperosmolality and hypernatremia: Secondary | ICD-10-CM | POA: Diagnosis present

## 2023-11-11 DIAGNOSIS — N179 Acute kidney failure, unspecified: Secondary | ICD-10-CM | POA: Diagnosis not present

## 2023-11-11 DIAGNOSIS — I4891 Unspecified atrial fibrillation: Secondary | ICD-10-CM | POA: Diagnosis not present

## 2023-11-11 DIAGNOSIS — I5032 Chronic diastolic (congestive) heart failure: Secondary | ICD-10-CM | POA: Diagnosis present

## 2023-11-11 DIAGNOSIS — Z66 Do not resuscitate: Secondary | ICD-10-CM | POA: Diagnosis not present

## 2023-11-11 DIAGNOSIS — F03C11 Unspecified dementia, severe, with agitation: Secondary | ICD-10-CM | POA: Diagnosis not present

## 2023-11-11 DIAGNOSIS — Z8679 Personal history of other diseases of the circulatory system: Secondary | ICD-10-CM

## 2023-11-11 DIAGNOSIS — I480 Paroxysmal atrial fibrillation: Secondary | ICD-10-CM | POA: Diagnosis present

## 2023-11-11 DIAGNOSIS — K2101 Gastro-esophageal reflux disease with esophagitis, with bleeding: Secondary | ICD-10-CM | POA: Diagnosis not present

## 2023-11-11 DIAGNOSIS — R918 Other nonspecific abnormal finding of lung field: Secondary | ICD-10-CM | POA: Diagnosis not present

## 2023-11-11 DIAGNOSIS — K5900 Constipation, unspecified: Secondary | ICD-10-CM | POA: Diagnosis not present

## 2023-11-11 DIAGNOSIS — K2289 Other specified disease of esophagus: Secondary | ICD-10-CM | POA: Diagnosis not present

## 2023-11-11 DIAGNOSIS — J180 Bronchopneumonia, unspecified organism: Secondary | ICD-10-CM | POA: Diagnosis not present

## 2023-11-11 DIAGNOSIS — E538 Deficiency of other specified B group vitamins: Secondary | ICD-10-CM | POA: Diagnosis present

## 2023-11-11 DIAGNOSIS — F03C3 Unspecified dementia, severe, with mood disturbance: Secondary | ICD-10-CM | POA: Diagnosis not present

## 2023-11-11 DIAGNOSIS — I10 Essential (primary) hypertension: Secondary | ICD-10-CM | POA: Diagnosis present

## 2023-11-11 DIAGNOSIS — Z79899 Other long term (current) drug therapy: Secondary | ICD-10-CM

## 2023-11-11 DIAGNOSIS — Z823 Family history of stroke: Secondary | ICD-10-CM

## 2023-11-11 DIAGNOSIS — D62 Acute posthemorrhagic anemia: Secondary | ICD-10-CM | POA: Diagnosis not present

## 2023-11-11 DIAGNOSIS — E440 Moderate protein-calorie malnutrition: Secondary | ICD-10-CM | POA: Diagnosis not present

## 2023-11-11 DIAGNOSIS — Z8546 Personal history of malignant neoplasm of prostate: Secondary | ICD-10-CM

## 2023-11-11 DIAGNOSIS — F32A Depression, unspecified: Secondary | ICD-10-CM | POA: Diagnosis not present

## 2023-11-11 DIAGNOSIS — F0393 Unspecified dementia, unspecified severity, with mood disturbance: Secondary | ICD-10-CM | POA: Diagnosis present

## 2023-11-11 DIAGNOSIS — K219 Gastro-esophageal reflux disease without esophagitis: Secondary | ICD-10-CM | POA: Diagnosis present

## 2023-11-11 DIAGNOSIS — Z515 Encounter for palliative care: Secondary | ICD-10-CM | POA: Diagnosis not present

## 2023-11-11 DIAGNOSIS — Z801 Family history of malignant neoplasm of trachea, bronchus and lung: Secondary | ICD-10-CM

## 2023-11-11 DIAGNOSIS — Z1152 Encounter for screening for COVID-19: Secondary | ICD-10-CM

## 2023-11-11 DIAGNOSIS — Z79890 Hormone replacement therapy (postmenopausal): Secondary | ICD-10-CM

## 2023-11-11 DIAGNOSIS — R1111 Vomiting without nausea: Secondary | ICD-10-CM | POA: Diagnosis not present

## 2023-11-11 DIAGNOSIS — K5730 Diverticulosis of large intestine without perforation or abscess without bleeding: Secondary | ICD-10-CM | POA: Diagnosis present

## 2023-11-11 DIAGNOSIS — K440 Diaphragmatic hernia with obstruction, without gangrene: Secondary | ICD-10-CM | POA: Diagnosis not present

## 2023-11-11 DIAGNOSIS — Z951 Presence of aortocoronary bypass graft: Secondary | ICD-10-CM

## 2023-11-11 DIAGNOSIS — R509 Fever, unspecified: Secondary | ICD-10-CM | POA: Diagnosis not present

## 2023-11-11 DIAGNOSIS — K449 Diaphragmatic hernia without obstruction or gangrene: Secondary | ICD-10-CM | POA: Diagnosis not present

## 2023-11-11 DIAGNOSIS — E785 Hyperlipidemia, unspecified: Secondary | ICD-10-CM | POA: Diagnosis present

## 2023-11-11 DIAGNOSIS — I959 Hypotension, unspecified: Secondary | ICD-10-CM | POA: Diagnosis not present

## 2023-11-11 DIAGNOSIS — Z8051 Family history of malignant neoplasm of kidney: Secondary | ICD-10-CM

## 2023-11-11 DIAGNOSIS — K209 Esophagitis, unspecified without bleeding: Secondary | ICD-10-CM | POA: Diagnosis not present

## 2023-11-11 DIAGNOSIS — Z9079 Acquired absence of other genital organ(s): Secondary | ICD-10-CM

## 2023-11-11 LAB — BASIC METABOLIC PANEL WITH GFR
Anion gap: 16 — ABNORMAL HIGH (ref 5–15)
BUN: 41 mg/dL — ABNORMAL HIGH (ref 8–23)
CO2: 27 mmol/L (ref 22–32)
Calcium: 9.3 mg/dL (ref 8.9–10.3)
Chloride: 106 mmol/L (ref 98–111)
Creatinine, Ser: 0.99 mg/dL (ref 0.61–1.24)
GFR, Estimated: >60 mL/min (ref >60)
Glucose, Bld: 155 mg/dL — ABNORMAL HIGH (ref 70–99)
Potassium: 4.2 mmol/L (ref 3.5–5.1)
Sodium: 148 mmol/L — ABNORMAL HIGH (ref 135–145)

## 2023-11-11 LAB — CBC WITH DIFFERENTIAL/PLATELET
Abs Immature Granulocytes: 0.03 K/uL (ref 0.00–0.07)
Basophils Absolute: 0 K/uL (ref 0.0–0.1)
Basophils Relative: 0 %
Eosinophils Absolute: 0 K/uL (ref 0.0–0.5)
Eosinophils Relative: 0 %
HCT: 31.5 % — ABNORMAL LOW (ref 39.0–52.0)
Hemoglobin: 9.7 g/dL — ABNORMAL LOW (ref 13.0–17.0)
Immature Granulocytes: 0 %
Lymphocytes Relative: 8 %
Lymphs Abs: 0.6 K/uL — ABNORMAL LOW (ref 0.7–4.0)
MCH: 30.1 pg (ref 26.0–34.0)
MCHC: 30.8 g/dL (ref 30.0–36.0)
MCV: 97.8 fL (ref 80.0–100.0)
Monocytes Absolute: 0.7 K/uL (ref 0.1–1.0)
Monocytes Relative: 9 %
Neutro Abs: 6.2 K/uL (ref 1.7–7.7)
Neutrophils Relative %: 83 %
Platelets: 197 K/uL (ref 150–400)
RBC: 3.22 MIL/uL — ABNORMAL LOW (ref 4.22–5.81)
RDW: 14.2 % (ref 11.5–15.5)
WBC: 7.6 K/uL (ref 4.0–10.5)
nRBC: 0 % (ref 0.0–0.2)

## 2023-11-11 LAB — COMPREHENSIVE METABOLIC PANEL WITH GFR
ALT: 49 U/L — ABNORMAL HIGH (ref 0–44)
AST: 45 U/L — ABNORMAL HIGH (ref 15–41)
Albumin: 3.4 g/dL — ABNORMAL LOW (ref 3.5–5.0)
Alkaline Phosphatase: 58 U/L (ref 38–126)
Anion gap: 13 (ref 5–15)
BUN: 38 mg/dL — ABNORMAL HIGH (ref 8–23)
CO2: 29 mmol/L (ref 22–32)
Calcium: 9.5 mg/dL (ref 8.9–10.3)
Chloride: 104 mmol/L (ref 98–111)
Creatinine, Ser: 0.92 mg/dL (ref 0.61–1.24)
GFR, Estimated: >60 mL/min (ref >60)
Glucose, Bld: 179 mg/dL — ABNORMAL HIGH (ref 70–99)
Potassium: 4 mmol/L (ref 3.5–5.1)
Sodium: 146 mmol/L — ABNORMAL HIGH (ref 135–145)
Total Bilirubin: 0.4 mg/dL (ref 0.0–1.2)
Total Protein: 6 g/dL — ABNORMAL LOW (ref 6.5–8.1)

## 2023-11-11 LAB — HEMOGLOBIN AND HEMATOCRIT, BLOOD
HCT: 29.1 % — ABNORMAL LOW (ref 39.0–52.0)
HCT: 23.5 % — ABNORMAL LOW (ref 39.0–52.0)
Hemoglobin: 9 g/dL — ABNORMAL LOW (ref 13.0–17.0)
Hemoglobin: 7.1 g/dL — ABNORMAL LOW (ref 13.0–17.0)

## 2023-11-11 LAB — RESP PANEL BY RT-PCR (RSV, FLU A&B, COVID)  RVPGX2
Influenza A by PCR: NEGATIVE
Influenza B by PCR: NEGATIVE
Resp Syncytial Virus by PCR: NEGATIVE
SARS Coronavirus 2 by RT PCR: NEGATIVE

## 2023-11-11 LAB — LIPASE, BLOOD: Lipase: 54 U/L — ABNORMAL HIGH (ref 11–51)

## 2023-11-11 LAB — GLUCOSE, CAPILLARY
Glucose-Capillary: 130 mg/dL — ABNORMAL HIGH (ref 70–99)
Glucose-Capillary: 131 mg/dL — ABNORMAL HIGH (ref 70–99)
Glucose-Capillary: 142 mg/dL — ABNORMAL HIGH (ref 70–99)

## 2023-11-11 LAB — PROTIME-INR
INR: 1.2 (ref 0.8–1.2)
Prothrombin Time: 15.4 s — ABNORMAL HIGH (ref 11.4–15.2)

## 2023-11-11 LAB — MRSA NEXT GEN BY PCR, NASAL: MRSA by PCR Next Gen: DETECTED — AB

## 2023-11-11 MED ORDER — ALUM & MAG HYDROXIDE-SIMETH 200-200-20 MG/5ML PO SUSP: 30.0000 mL | Freq: Four times a day (QID) | ORAL | Status: DC | PRN

## 2023-11-11 MED ORDER — ACETAMINOPHEN 650 MG RE SUPP: 650.0000 mg | Freq: Four times a day (QID) | RECTAL | Status: DC | PRN

## 2023-11-11 MED ORDER — ONDANSETRON HCL 4 MG/2ML IJ SOLN: 4.0000 mg | Freq: Four times a day (QID) | INTRAMUSCULAR | Status: DC | PRN

## 2023-11-11 MED ORDER — PHENOL 1.4 % MT LIQD: 2.0000 | OROMUCOSAL | Status: DC | PRN

## 2023-11-11 MED ORDER — PROCHLORPERAZINE EDISYLATE 10 MG/2ML IJ SOLN
5.0000 mg | INTRAMUSCULAR | Status: DC | PRN
  Administered 2023-11-13 – 2023-11-14 (×2): 5 mg via INTRAVENOUS
  Filled 2023-11-11 (×2): qty 2

## 2023-11-11 MED ORDER — DIAZEPAM 5 MG/ML IJ SOLN: 5.0000 mg | Freq: Once | INTRAMUSCULAR | Status: DC

## 2023-11-11 MED ORDER — ONDANSETRON HCL 4 MG PO TABS: 4.0000 mg | ORAL_TABLET | Freq: Four times a day (QID) | ORAL | Status: DC | PRN

## 2023-11-11 MED ORDER — ORAL CARE MOUTH RINSE: 15.0000 mL | OROMUCOSAL | Status: DC | PRN

## 2023-11-11 MED ORDER — HALOPERIDOL LACTATE 5 MG/ML IJ SOLN
3.0000 mg | Freq: Four times a day (QID) | INTRAMUSCULAR | Status: AC | PRN
  Administered 2023-11-11 – 2023-11-13 (×3): 3 mg via INTRAVENOUS
  Filled 2023-11-11 (×3): qty 1

## 2023-11-11 MED ORDER — SODIUM CHLORIDE 0.9 % IV SOLN
3.0000 g | Freq: Once | INTRAVENOUS | Status: AC
  Administered 2023-11-11: 3 g via INTRAVENOUS
  Filled 2023-11-11: qty 8

## 2023-11-11 MED ORDER — CARMEX CLASSIC LIP BALM EX OINT
TOPICAL_OINTMENT | CUTANEOUS | Status: DC | PRN
  Filled 2023-11-11: qty 10

## 2023-11-11 MED ORDER — CHLORHEXIDINE GLUCONATE CLOTH 2 % EX PADS
6.0000 | MEDICATED_PAD | Freq: Every day | CUTANEOUS | Status: DC
Start: 2023-11-11 — End: 2023-11-15
  Administered 2023-11-11 – 2023-11-14 (×4): 6 via TOPICAL

## 2023-11-11 MED ORDER — BISACODYL 10 MG RE SUPP
10.0000 mg | Freq: Every day | RECTAL | Status: DC
Start: 2023-11-11 — End: 2023-11-15
  Administered 2023-11-11 – 2023-11-14 (×4): 10 mg via RECTAL
  Filled 2023-11-11 (×4): qty 1

## 2023-11-11 MED ORDER — SIMETHICONE 80 MG PO CHEW
80.0000 mg | CHEWABLE_TABLET | Freq: Four times a day (QID) | ORAL | Status: AC
  Administered 2023-11-11: 80 mg via ORAL
  Filled 2023-11-11 (×2): qty 1

## 2023-11-11 MED ORDER — LORAZEPAM 2 MG/ML IJ SOLN
1.0000 mg | Freq: Once | INTRAMUSCULAR | Status: AC
  Administered 2023-11-11: 1 mg via INTRAVENOUS
  Filled 2023-11-11: qty 1

## 2023-11-11 MED ORDER — LACTATED RINGERS IV BOLUS: 1000.0000 mL | Freq: Three times a day (TID) | INTRAVENOUS | Status: AC | PRN

## 2023-11-11 MED ORDER — MAGIC MOUTHWASH: 15.0000 mL | Freq: Four times a day (QID) | ORAL | Status: DC | PRN

## 2023-11-11 MED ORDER — ACETAMINOPHEN 325 MG PO TABS: 650.0000 mg | ORAL_TABLET | Freq: Four times a day (QID) | ORAL | Status: DC | PRN

## 2023-11-11 MED ORDER — SODIUM CHLORIDE 0.9 % IV SOLN: INTRAVENOUS | Status: AC

## 2023-11-11 MED ORDER — LACTATED RINGERS IV BOLUS
1000.0000 mL | Freq: Once | INTRAVENOUS | Status: AC
  Administered 2023-11-11: 1000 mL via INTRAVENOUS

## 2023-11-11 MED ORDER — IOHEXOL 350 MG/ML SOLN
100.0000 mL | Freq: Once | INTRAVENOUS | Status: AC | PRN
  Administered 2023-11-11: 100 mL via INTRAVENOUS

## 2023-11-11 MED ORDER — METHOCARBAMOL 1000 MG/10ML IJ SOLN
1000.0000 mg | Freq: Four times a day (QID) | INTRAMUSCULAR | Status: DC | PRN
  Administered 2023-11-11 – 2023-11-13 (×2): 1000 mg via INTRAVENOUS
  Filled 2023-11-11 (×3): qty 10

## 2023-11-11 MED ORDER — METOPROLOL TARTRATE 5 MG/5ML IV SOLN
5.0000 mg | Freq: Four times a day (QID) | INTRAVENOUS | Status: DC | PRN
  Administered 2023-11-12: 5 mg via INTRAVENOUS
  Filled 2023-11-11: qty 5

## 2023-11-11 MED ORDER — NAPHAZOLINE-GLYCERIN 0.012-0.25 % OP SOLN: 1.0000 [drp] | Freq: Four times a day (QID) | OPHTHALMIC | Status: DC | PRN

## 2023-11-11 MED ORDER — PANTOPRAZOLE SODIUM 40 MG IV SOLR
40.0000 mg | Freq: Once | INTRAVENOUS | Status: AC
  Administered 2023-11-11: 40 mg via INTRAVENOUS
  Filled 2023-11-11: qty 10

## 2023-11-11 MED ORDER — SALINE SPRAY 0.65 % NA SOLN
1.0000 | Freq: Four times a day (QID) | NASAL | Status: DC | PRN
Start: 2023-11-11 — End: 2023-11-15

## 2023-11-11 MED ORDER — MENTHOL 3 MG MT LOZG: 1.0000 | LOZENGE | OROMUCOSAL | Status: DC | PRN

## 2023-11-11 MED ORDER — ONDANSETRON HCL 4 MG/2ML IJ SOLN
4.0000 mg | Freq: Once | INTRAMUSCULAR | Status: AC
  Administered 2023-11-11: 4 mg via INTRAVENOUS
  Filled 2023-11-11: qty 2

## 2023-11-11 MED ORDER — SODIUM CHLORIDE 0.45 % IV SOLN: INTRAVENOUS | Status: AC

## 2023-11-11 MED ORDER — PANTOPRAZOLE SODIUM 40 MG IV SOLR
40.0000 mg | Freq: Two times a day (BID) | INTRAVENOUS | Status: DC
Start: 2023-11-11 — End: 2023-11-15
  Administered 2023-11-11 – 2023-11-14 (×6): 40 mg via INTRAVENOUS
  Filled 2023-11-11 (×6): qty 10

## 2023-11-11 MED ORDER — SODIUM CHLORIDE 0.9 % IV SOLN
3.0000 g | Freq: Four times a day (QID) | INTRAVENOUS | Status: DC
  Administered 2023-11-11 – 2023-11-13 (×7): 3 g via INTRAVENOUS
  Filled 2023-11-11 (×8): qty 8

## 2023-11-11 NOTE — Progress Notes (Deleted)
 [name] [name]. May 30, 1927  [id number].    Requesting MD: Celinda, MD  Chief Complaint/Reason for Consult: gastric volvulus   HPI: the below history was gathered from chart review due to the patients dementia, making him a poor historian, and no family at bedside.  [name]. Is a 89 y/o M with advanced dementia, CAD, mitral valve repair, history of prostate cancer, HTN, a.fib, and HLD who presents from a nursing home with projectile, coffee ground emesis. CT chest showed acute organo-axial gastric volvulus from large chronic hiatal hernia. Per chart review he is not on blood thinners. I do not see any history of abdominal surgery - he has had a TURP 2018, mitral valve repair and CABG 2015.   ROS: Review of Systems  Unable to perform ROS: Dementia    Family History  Problem Relation Age of Onset   Hypertension Mother    Sudden death Mother    Stroke Mother    Heart disease Mother    Hypertension Father    Sudden death Father    Stroke Father    Heart disease Father    Heart disease Son    Diabetes Son    Lung cancer Brother    Stomach cancer Brother    Cancer Daughter        rebdomyosarcoma   Kidney cancer Brother        mets   Diabetes Sister     Past Medical History:  Diagnosis Date   Complication of anesthesia    serious cognisance problems since my OR in January (03/13/2013   Coronary artery disease    Diverticulosis    Hiatal hernia    Hx of echocardiogram    Echo 5/16:  Mild focal basal septal hypertrophy, EF 55%, mild AI, MV repair ok with trivial MR (mean 3 mmHg), mild LAE, mild RAE, PASP 35 mmHg   Hypertension    Hypothyroidism    Irritable bowel syndrome    PAF (paroxysmal atrial fibrillation) (HCC) 05/03/2014   Personal history of colonic polyps    tubular adenoma   Pleural effusion, bilateral 03/12/2013   Small to moderate, L>R   Prostate cancer [facility]) 1991   se[facility]. Narvis at [facility], observation only    S/P CABG x 2 02/28/2013   LIMA to  LAD, SVG to OM1, EVH via right thigh   S/P Maze operation for atrial fibrillation 02/28/2013   Complete bilateral atrial lesion set using bipolar radiofrequency and cryothermy ablation with clipping of LA appendage   S/P mitral valve repair, maze procedure, and CABG x2 02/28/2013   Complex valvuloplasty including triangular resection of posterior leaflet, 30 mm Sorin Memo 3D ring annuloplasty    Past Surgical History:  Procedure Laterality Date   CARDIAC CATHETERIZATION  2015   CATARACT EXTRACTION, BILATERAL Bilateral 2014   per Dr. Roz    COLONOSCOPY  2011   per Dr. Jakie, benign polyps, no repeats needed    CORONARY ARTERY BYPASS GRAFT N/A 02/28/2013   Procedure: CORONARY ARTERY BYPASS GRAFTING (CABG);  Surgeon: Sudie VEAR Laine, MD;  Location: [facility] OR;  Service: Open Heart Surgery;  Laterality: N/A;  CABG x 2,  using left internal mammary artery and right leg saphenous vein harvested endoscopically   ESOPHAGOGASTRODUODENOSCOPY (EGD) WITH PROPOFOL  N/A 01/11/2019   Procedure: ESOPHAGOGASTRODUODENOSCOPY (EGD) WITH PROPOFOL ;  Surgeon: Legrand Victory LITTIE DOUGLAS, MD;  Location: [facility];  Service: Gastroenterology;  Laterality: N/A;   HEMORRHOID SURGERY     INTRAOPERATIVE TRANSESOPHAGEAL  ECHOCARDIOGRAM N/A 02/28/2013   Procedure: INTRAOPERATIVE TRANSESOPHAGEAL ECHOCARDIOGRAM;  Surgeon: Sudie VEAR Laine, MD;  Location: [facility] OR;  Service: Open Heart Surgery;  Laterality: N/A;   LEFT HEART CATHETERIZATION WITH CORONARY ANGIOGRAM N/A 02/26/2013   Procedure: LEFT HEART CATHETERIZATION WITH CORONARY ANGIOGRAM;  Surgeon: Alm LELON Clay, MD;  Location: [facility] CATH LAB;  Service: Cardiovascular;  Laterality: N/A;   MAZE N/A 02/28/2013   Procedure: MAZE;  Surgeon: Sudie VEAR Laine, MD;  Location: [facility] OR;  Service: Open Heart Surgery;  Laterality: N/A;   MITRAL VALVE REPAIR N/A 02/28/2013   Procedure: MITRAL VALVE REPAIR (MVR);  Surgeon: Sudie VEAR Laine, MD;  Location: [facility] OR;  Service: Open Heart Surgery;  Laterality: N/A;    PROSTATE BIOPSY     TRANSURETHRAL RESECTION OF PROSTATE N/A 02/04/2017   Procedure: TRANSURETHRAL RESECTION OF THE PROSTATE (TURP);  Surgeon: Alvaro Hummer, MD;  Location: [facility] ORS;  Service: Urology;  Laterality: N/A;    Social History:  reports that he has never smoked. He has never used smokeless tobacco. He reports that he does not drink alcohol and does not use drugs.  Allergies:  Allergies  Allergen Reactions   Other Other (See Comments)    ANESTHESIA.... 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 ANESTHESIA.... 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 Occurred post op CABG 2015, required readmission and rehab    (Not in a hospital admission)    Physical Exam: Blood pressure 133/83, pulse 77, temperature 98.1 F (36.7 C), temperature source Oral, resp. rate 16, SpO2 98%. General: Pleasant white male  laying on hospital bed, NAD. HEENT: head -normocephalic, atraumatic; Eyes: PERRLA, no conjunctival injection Neck- Trachea is midline CV- RRR, no lower extremity edema  Pulm- breathing is non-labored Abd- soft, NT/ND, no masses, hernias, or organomegaly. GU- deferred  MSK- UE/LE symmetrical, no cyanosis, clubbing, or edema. Skin: warm and dry, no rashes or lesions   Results for orders placed or performed during the hospital encounter of 11/11/23 (from the past 48 hours)  CBC with Differential     Status: Abnormal   Collection Time: 11/11/23  2:17 AM  Result Value Ref Range   WBC 7.6 4.0 - 10.5 K/uL   RBC 3.22 (L) 4.22 - 5.81 MIL/uL   Hemoglobin 9.7 (L) 13.0 - 17.0 g/dL   HCT 68.4 (L) 60.9 - 47.9 %   MCV 97.8 80.0 - 100.0 fL   MCH 30.1 26.0 - 34.0 pg   MCHC 30.8 30.0 - 36.0 g/dL   RDW 85.7 88.4 - 84.4 %   Platelets 197 150 - 400 K/uL   nRBC 0.0 0.0 - 0.2 %   Neutrophils Relative % 83 %   Neutro Abs 6.2 1.7 - 7.7 K/uL   Lymphocytes Relative 8 %   Lymphs Abs 0.6 (L) 0.7 - 4.0 K/uL   Monocytes Relative 9 %   Monocytes Absolute 0.7  0.1 - 1.0 K/uL   Eosinophils Relative 0 %   Eosinophils Absolute 0.0 0.0 - 0.5 K/uL   Basophils Relative 0 %   Basophils Absolute 0.0 0.0 - 0.1 K/uL   Immature Granulocytes 0 %   Abs Immature Granulocytes 0.03 0.00 - 0.07 K/uL    Comment: Performed at [facility], [address], [place], [place] [address]  Comprehensive metabolic panel     Status: Abnormal   Collection Time: 11/11/23  2:17 AM  Result Value Ref Range   Sodium 146 (H) 135 - 145 mmol/L   Potassium  4.0 3.5 - 5.1 mmol/L   Chloride 104 98 - 111 mmol/L   CO2 29 22 - 32 mmol/L   Glucose, Bld 179 (H) 70 - 99 mg/dL    Comment: Glucose reference range applies only to samples taken after fasting for at least 8 hours.   BUN 38 (H) 8 - 23 mg/dL   Creatinine, Ser 9.07 0.61 - 1.24 mg/dL   Calcium  9.5 8.9 - 10.3 mg/dL   Total Protein 6.0 (L) 6.5 - 8.1 g/dL   Albumin  3.4 (L) 3.5 - 5.0 g/dL   AST 45 (H) 15 - 41 U/L   ALT 49 (H) 0 - 44 U/L   Alkaline Phosphatase 58 38 - 126 U/L   Total Bilirubin 0.4 0.0 - 1.2 mg/dL   GFR, Estimated >39 >39 mL/min    Comment: (NOTE) Calculated using the CKD-EPI Creatinine Equation (2021)    Anion gap 13 5 - 15    Comment: Performed at [facility], [address] [place], [place] [address]  Protime-INR     Status: Abnormal   Collection Time: 11/11/23  2:17 AM  Result Value Ref Range   Prothrombin Time 15.4 (H) 11.4 - 15.2 seconds   INR 1.2 0.8 - 1.2    Comment: (NOTE) INR goal varies based on device and disease states. Performed at [facility], [address], [place], [place] [address]   Type and screen [facility]     Status: None   Collection Time: 11/11/23  2:17 AM  Result Value Ref Range   ABO/RH(D) O POS    Antibody Screen NEG    Sample Expiration      11/14/2023,2359 Performed at [facility], [address], [place], [place] [address]   Lipase, blood     Status: Abnormal    Collection Time: 11/11/23  2:17 AM  Result Value Ref Range   Lipase 54 (H) 11 - 51 U/L    Comment: Performed at [facility], [address], [place], [place] [address]  Resp panel by RT-PCR (RSV, Flu A&B, Covid) Anterior Nasal Swab     Status: None   Collection Time: 11/11/23  2:20 AM   Specimen: Anterior Nasal Swab  Result Value Ref Range   SARS Coronavirus 2 by RT PCR NEGATIVE NEGATIVE    Comment: (NOTE) SARS-CoV-2 target nucleic acids are NOT DETECTED.  The SARS-CoV-2 RNA is generally detectable in upper respiratory specimens during the acute phase of infection. The lowest concentration of SARS-CoV-2 viral copies this assay can detect is 138 copies/mL. A negative result does not preclude SARS-Cov-2 infection and should not be used as the sole basis for treatment or other patient management decisions. A negative result may occur with  improper specimen collection/handling, submission of specimen other than nasopharyngeal swab, presence of viral mutation(s) within the areas targeted by this assay, and inadequate number of viral copies(<138 copies/mL). A negative result must be combined with clinical observations, patient history, and epidemiological information. The expected result is Negative.  Fact Sheet for Patients:  BloggerCourse.com  Fact Sheet for Healthcare Providers:  SeriousBroker.it  This test is no t yet approved or cleared by the [place]  FDA and  has been authorized for detection and/or diagnosis of SARS-CoV-2 by FDA under an Emergency Use Authorization (EUA). This EUA will remain  in effect (meaning this test can be used) for the duration of the COVID-19 declaration under Section 564(b)(1) of the Act, 21 U.S.C.section 360bbb-3(b)(1),  unless the authorization is terminated  or revoked sooner.       Influenza A by PCR NEGATIVE NEGATIVE   Influenza B by PCR NEGATIVE NEGATIVE    Comment:  (NOTE) The Xpert Xpress SARS-CoV-2/FLU/RSV plus assay is intended as an aid in the diagnosis of influenza from Nasopharyngeal swab specimens and should not be used as a sole basis for treatment. Nasal washings and aspirates are unacceptable for Xpert Xpress SARS-CoV-2/FLU/RSV testing.  Fact Sheet for Patients: BloggerCourse.com  Fact Sheet for Healthcare Providers: SeriousBroker.it  This test is not yet approved or cleared by the [place]  FDA and has been authorized for detection and/or diagnosis of SARS-CoV-2 by FDA under an Emergency Use Authorization (EUA). This EUA will remain in effect (meaning this test can be used) for the duration of the COVID-19 declaration under Section 564(b)(1) of the Act, 21 U.S.C. section 360bbb-3(b)(1), unless the authorization is terminated or revoked.     Resp Syncytial Virus by PCR NEGATIVE NEGATIVE    Comment: (NOTE) Fact Sheet for Patients: BloggerCourse.com  Fact Sheet for Healthcare Providers: SeriousBroker.it  This test is not yet approved or cleared by the [place]  FDA and has been authorized for detection and/or diagnosis of SARS-CoV-2 by FDA under an Emergency Use Authorization (EUA). This EUA will remain in effect (meaning this test can be used) for the duration of the COVID-19 declaration under Section 564(b)(1) of the Act, 21 U.S.C. section 360bbb-3(b)(1), unless the authorization is terminated or revoked.  Performed at [facility], [address], [place], [place] [address]    CT ANGIO GI BLEED Addendum Date: 11/11/2023 ADDENDUM REPORT: 11/11/2023 05:28 ADDENDUM: #2 was discussed by telephone with PA [facility] SPONSELLER on 11/11/2023 at 05:28. At 0521 hours. Electronically Signed   By: VEAR Hurst M.D.   On: 11/11/2023 05:28   Result Date: 11/11/2023 CLINICAL DATA:  89 year old male with coffee-ground  emesis. Chronic intrathoracic stomach. EXAM: CTA ABDOMEN AND PELVIS WITHOUT AND WITH CONTRAST TECHNIQUE: Multidetector CT imaging of the abdomen and pelvis was performed using the standard protocol during bolus administration of intravenous contrast. Multiplanar reconstructed images and MIPs were obtained and reviewed to evaluate the vascular anatomy. RADIATION DOSE REDUCTION: This exam was performed according to the departmental dose-optimization program which includes automated exposure control, adjustment of the mA and/or kV according to patient size and/or use of iterative reconstruction technique. CONTRAST:  OMNIPAQUE  IOHEXOL  350 MG/ML SOLN COMPARISON:  CT chest today reported separately. CT Abdomen and Pelvis 07/28/2021. FINDINGS: VASCULAR Extensive Aortoiliac calcified atherosclerosis. Normal caliber abdominal aorta. Major arterial structures in the abdomen and pelvis remain patent despite the atherosclerosis. On delayed phase the portal venous system appears to remain patent. There is some mass effect on the splenic vein from the gastric abnormality, but splenic vein remains patent. No active contrast extravasation into bowel lumen is identified on this exam. Review of the MIP images confirms the above findings. NON-VASCULAR Lower chest: Detailed separately today, with dilated and obstructed stomach secondary to a gastric volvulus redemonstrated. Pre contrast, arterial and portal venous phase postcontrast images are provided. The entire intrathoracic portion of the dilated stomach is not included on these images. But no contrast extravasation into the stomach visible stomach is identified. See additional details on chest CT today. Hepatobiliary: Chronic gallstones. No acute liver or gallbladder finding. Pancreas: Architectural distortion from the gastric abnormality, otherwise negative. Spleen: Spleen is stable and within normal limits for age, with mild post granulomatous calcifications and mildly  heterogeneous enhancement. No  splenomegaly. Adrenals/Urinary Tract: Negative adrenal glands. Negative right kidney. Chronic left renal double-J ureteral stent, appears stable since 2023. Chronic urinary bladder wall thickening. Stomach/Bowel: Due to gastric volvulus as detailed separately on chest CT today. The compressed gastric outlet and proximal duodenum is redemonstrated on series 18, image 20. From there the duodenum is within normal limits. Nondilated jejunum. Nondilated distal small bowel. Redundant large bowel with diverticulosis and retained stool. But most large bowel loops are decompressed, including the splenic flexure and the transverse colon. Furthermore, the mid transverse colon also appears to be herniated and mildly twisted above the diaphragm in conjunction with the gastric hernia and volvulus. See coronal images 92 (proximal) and 98 (distal). But the upstream right colon is nondilated. No pneumoperitoneum.  No free fluid in the abdomen. Moderate size stool ball in the rectum, new since 2023. Lymphatic: No lymphadenopathy identified. Reproductive: Negative. Other: Chronic left pelvic sidewall and presacral stranding is stable since 2023. no acute pelvic free fluid. Musculoskeletal: Chronic hyperostosis related thoracic spinal ankylosis. Chronic L5 compression fracture is stable since 2023. Superimposed degenerative and/or posttraumatic lower lumbar facet ankylosis. No acute osseous abnormality identified. IMPRESSION: 1. Acute Gastric Outlet Obstruction secondary to Gastric Volvulus in the setting of chronic large hiatal hernia and intrathoracic stomach. See Chest CT today for additional details of the stomach. 2. NOTE ALSO the midline segment of the Transverse Colon also appears to be herniated and twisted in conjunction with #1. However, there are no dilated or obstructed bowel loops at this time distal to the stomach. No pneumoperitoneum or free fluid. 3. No active gastrointestinal bleeding  identified by CTA. 4. No other acute finding in the abdomen or pelvis. Advanced large bowel diverticulosis. Advanced Aortic Atherosclerosis (ICD10-I70.0). Chronic left renal double-J ureteral stent. Chronic L5 compression fracture. Electronically Signed: By: VEAR Hurst M.D. On: 11/11/2023 05:02   CT Chest W Contrast Addendum Date: 11/11/2023 ADDENDUM REPORT: 11/11/2023 05:08 ADDENDUM: Study discussed by telephone with PA [facility] SPONSELLER on 11/11/2023 at 0452 hours. Electronically Signed   By: VEAR Hurst M.D.   On: 11/11/2023 05:08   Result Date: 11/11/2023 CLINICAL DATA:  89 year old male with coffee-ground emesis. Chronic intrathoracic stomach. EXAM: CT CHEST WITH CONTRAST TECHNIQUE: Multidetector CT imaging of the chest was performed during intravenous contrast administration. RADIATION DOSE REDUCTION: This exam was performed according to the departmental dose-optimization program which includes automated exposure control, adjustment of the mA and/or kV according to patient size and/or use of iterative reconstruction technique. CONTRAST:  OMNIPAQUE  IOHEXOL  350 MG/ML SOLN COMPARISON:  Prior CT Abdomen and Pelvis 07/28/2021. Prior chest CT 03/12/2013. CTA Abdomen and Pelvis today reported separately. FINDINGS: Cardiovascular: Chronic sternotomy, CABG. Chronic atherosclerosis and tortuosity of the thoracic aorta, stable since 2015. Mild cardiomegaly appears increased. No pericardial effusion. Central pulmonary arteries grossly patent. Mediastinum/Nodes: Abnormally dilated gas and fluid containing esophagus which tapers to the gastroesophageal junction. Chronic intrathoracic stomach. New since 07/28/2021 the stomach is diffusely dilated, fluid-filled, and twisted such that a gastric volvulus is present. Previously the gastric antrum was intrathoracic. Now up portion of dilated gas and fluid containing stomach is subdiaphragmatic, and the gastric outlet is severely compressed as seen on series 2, images 54  through 62 and coronal images 54 through 65. There is a small volume of edema or free fluid in the posterior mesentery. No superimposed mediastinal mass or lymphadenopathy. There are small chronic post granulomatous calcified mediastinal and hilar lymph nodes. Lungs/Pleura: Chronic left lung base architectural distortion from intrathoracic stomach. Central airways  remain patent but there is left lower lobe consolidation now in the setting of gastric distension. Additional sub solid peribronchovascular nodularity in the right upper lobe (series 4, images 48 through 62). Contralateral left upper lobe mostly dependent ground-glass opacity more resembles atelectasis. No pneumothorax. Small volume mildly complex layering fluid in the left hemithorax could be in the posterior mediastinum or the pleural space on series 2, image 33. There is no drainable pleural effusion. Upper Abdomen: Dilated gas and fluid containing distal stomach subjacent to the diaphragm now with twisted and compressed gastric outlet (series 2, image 54). No pneumoperitoneum is identified. There is a small volume of left quadrant ascites. Otherwise see CTA Abdomen and Pelvis reported separately. Musculoskeletal: Chronically exaggerated thoracic kyphosis with hyperostosis related widespread thoracic spinal ankylosis. This is similar to, mildly progressed from the 2015 CT. Chronic sternotomy. No acute or suspicious osseous lesion identified. IMPRESSION: 1. Positive for Acute Gastric Volvulus in the setting of chronic intrathoracic stomach from large chronic hiatal hernia. Deedra this is an organo-axial volvulus. Dilated air and fluid containing stomach and esophagus which - rather than fully intrathoracic as seen in 2023 - distal stomach now is subdiaphragmatic, with twisted, compressed and obstructed gastric outlet (series 2, image 54). 2. Small volume of free fluid in the posterior mediastinum and/or left hemithorax. No pneumomediastinum. No  pneumoperitoneum in the visible upper abdomen (see also CTA Abdomen and Pelvis today reported separately.). 3. Associated early bilateral lung infection with right upper lobe dependent bronchopneumonia and left lower lobe atelectasis versus consolidation. Electronically Signed: By: VEAR Hurst M.D. On: 11/11/2023 04:47      Assessment/Plan Chronic hiatal hernia now with acute organoaxial volvulus   Given his multiple medical problems and advanced age/dementia, I would ask GI to endoscopically decompress this patient and then attempt non-operative management with gradual diet advancement. He is at risk for recurrence and, if he fails a PO trial due to recurrence, would need a serious risk/benefit discussion with the patient and his family prior to proceeding with surgical repair or laparoscopic reduction/gastropexy.   Below per [facility]--  Afib - s/p maze procedure CAD s/p CABG CHF Prostate cancer Diverticulosis  OA PMH frequent falls   I reviewed nursing notes, Consultant GI notes, hospitalist notes, last 24 h vitals and pain scores, last 48 h intake and output, last 24 h labs and trends, and last 24 h imaging results.  Almarie GORMAN Pringle, [facility] Surgery 11/11/2023, 8:08 AM Please see Amion for pager number during day hours 7:00am-4:30pm or 7:00am -11:30am on weekends

## 2023-11-11 NOTE — Consult Note (Addendum)
 [name]. May 30, 1927  [id number].    Requesting MD: Celinda, MD  Chief Complaint/Reason for Consult: gastric volvulus   HPI: the below history was gathered from chart review due to the patients dementia, making him a poor historian, and no family at bedside.  [name]. Is a 89 y/o M with advanced dementia, CAD, mitral valve repair, history of prostate cancer, HTN, a.fib, and HLD who presents from a nursing home with projectile, coffee ground emesis. CT chest showed acute organo-axial gastric volvulus from large chronic hiatal hernia. Per chart review he is not on blood thinners. I do not see any history of abdominal surgery - he has had a TURP 2018, mitral valve repair and CABG 2015.   ROS: Review of Systems  Unable to perform ROS: Dementia    Family History  Problem Relation Age of Onset   Hypertension Mother    Sudden death Mother    Stroke Mother    Heart disease Mother    Hypertension Father    Sudden death Father    Stroke Father    Heart disease Father    Heart disease Son    Diabetes Son    Lung cancer Brother    Stomach cancer Brother    Cancer Daughter        rebdomyosarcoma   Kidney cancer Brother        mets   Diabetes Sister     Past Medical History:  Diagnosis Date   Complication of anesthesia    serious cognisance problems since my OR in January (03/13/2013   Coronary artery disease    Diverticulosis    Hiatal hernia    Hx of echocardiogram    Echo 5/16:  Mild focal basal septal hypertrophy, EF 55%, mild AI, MV repair ok with trivial MR (mean 3 mmHg), mild LAE, mild RAE, PASP 35 mmHg   Hypertension    Hypothyroidism    Irritable bowel syndrome    PAF (paroxysmal atrial fibrillation) (HCC) 05/03/2014   Personal history of colonic polyps    tubular adenoma   Pleural effusion, bilateral 03/12/2013   Small to moderate, L>R   Prostate cancer [facility]) 1991   se[facility]. Narvis at [facility], observation only    S/P CABG x 2 02/28/2013   LIMA to  LAD, SVG to OM1, EVH via right thigh   S/P Maze operation for atrial fibrillation 02/28/2013   Complete bilateral atrial lesion set using bipolar radiofrequency and cryothermy ablation with clipping of LA appendage   S/P mitral valve repair, maze procedure, and CABG x2 02/28/2013   Complex valvuloplasty including triangular resection of posterior leaflet, 30 mm Sorin Memo 3D ring annuloplasty    Past Surgical History:  Procedure Laterality Date   CARDIAC CATHETERIZATION  2015   CATARACT EXTRACTION, BILATERAL Bilateral 2014   per Dr. Roz    COLONOSCOPY  2011   per Dr. Jakie, benign polyps, no repeats needed    CORONARY ARTERY BYPASS GRAFT N/A 02/28/2013   Procedure: CORONARY ARTERY BYPASS GRAFTING (CABG);  Surgeon: Sudie VEAR Laine, MD;  Location: [facility] OR;  Service: Open Heart Surgery;  Laterality: N/A;  CABG x 2,  using left internal mammary artery and right leg saphenous vein harvested endoscopically   ESOPHAGOGASTRODUODENOSCOPY (EGD) WITH PROPOFOL  N/A 01/11/2019   Procedure: ESOPHAGOGASTRODUODENOSCOPY (EGD) WITH PROPOFOL ;  Surgeon: Legrand Victory LITTIE DOUGLAS, MD;  Location: [facility];  Service: Gastroenterology;  Laterality: N/A;   HEMORRHOID SURGERY     INTRAOPERATIVE TRANSESOPHAGEAL  ECHOCARDIOGRAM N/A 02/28/2013   Procedure: INTRAOPERATIVE TRANSESOPHAGEAL ECHOCARDIOGRAM;  Surgeon: Sudie VEAR Laine, MD;  Location: [facility] OR;  Service: Open Heart Surgery;  Laterality: N/A;   LEFT HEART CATHETERIZATION WITH CORONARY ANGIOGRAM N/A 02/26/2013   Procedure: LEFT HEART CATHETERIZATION WITH CORONARY ANGIOGRAM;  Surgeon: Alm LELON Clay, MD;  Location: [facility] CATH LAB;  Service: Cardiovascular;  Laterality: N/A;   MAZE N/A 02/28/2013   Procedure: MAZE;  Surgeon: Sudie VEAR Laine, MD;  Location: [facility] OR;  Service: Open Heart Surgery;  Laterality: N/A;   MITRAL VALVE REPAIR N/A 02/28/2013   Procedure: MITRAL VALVE REPAIR (MVR);  Surgeon: Sudie VEAR Laine, MD;  Location: [facility] OR;  Service: Open Heart Surgery;  Laterality: N/A;    PROSTATE BIOPSY     TRANSURETHRAL RESECTION OF PROSTATE N/A 02/04/2017   Procedure: TRANSURETHRAL RESECTION OF THE PROSTATE (TURP);  Surgeon: Alvaro Hummer, MD;  Location: [facility] ORS;  Service: Urology;  Laterality: N/A;    Social History:  reports that he has never smoked. He has never used smokeless tobacco. He reports that he does not drink alcohol and does not use drugs.  Allergies:  Allergies  Allergen Reactions   Other Other (See Comments)    ANESTHESIA.... 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 ANESTHESIA.... 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 Occurred post op CABG 2015, required readmission and rehab    (Not in a hospital admission)    Physical Exam: Blood pressure 133/83, pulse 77, temperature 98.1 F (36.7 C), temperature source Oral, resp. rate 16, SpO2 98%. General: Pleasant white male  laying on hospital bed, NAD. HEENT: head -normocephalic, atraumatic; Eyes: PERRLA, no conjunctival injection Neck- Trachea is midline CV- RRR, no lower extremity edema  Pulm- breathing is non-labored Abd- soft, NT/ND, no masses, hernias, or organomegaly. GU- deferred  MSK- UE/LE symmetrical, no cyanosis, clubbing, or edema. Skin: warm and dry, no rashes or lesions   Results for orders placed or performed during the hospital encounter of 11/11/23 (from the past 48 hours)  CBC with Differential     Status: Abnormal   Collection Time: 11/11/23  2:17 AM  Result Value Ref Range   WBC 7.6 4.0 - 10.5 K/uL   RBC 3.22 (L) 4.22 - 5.81 MIL/uL   Hemoglobin 9.7 (L) 13.0 - 17.0 g/dL   HCT 68.4 (L) 60.9 - 47.9 %   MCV 97.8 80.0 - 100.0 fL   MCH 30.1 26.0 - 34.0 pg   MCHC 30.8 30.0 - 36.0 g/dL   RDW 85.7 88.4 - 84.4 %   Platelets 197 150 - 400 K/uL   nRBC 0.0 0.0 - 0.2 %   Neutrophils Relative % 83 %   Neutro Abs 6.2 1.7 - 7.7 K/uL   Lymphocytes Relative 8 %   Lymphs Abs 0.6 (L) 0.7 - 4.0 K/uL   Monocytes Relative 9 %   Monocytes Absolute 0.7  0.1 - 1.0 K/uL   Eosinophils Relative 0 %   Eosinophils Absolute 0.0 0.0 - 0.5 K/uL   Basophils Relative 0 %   Basophils Absolute 0.0 0.0 - 0.1 K/uL   Immature Granulocytes 0 %   Abs Immature Granulocytes 0.03 0.00 - 0.07 K/uL    Comment: Performed at [facility], [address], [place], [place] [address]  Comprehensive metabolic panel     Status: Abnormal   Collection Time: 11/11/23  2:17 AM  Result Value Ref Range   Sodium 146 (H) 135 - 145 mmol/L   Potassium  4.0 3.5 - 5.1 mmol/L   Chloride 104 98 - 111 mmol/L   CO2 29 22 - 32 mmol/L   Glucose, Bld 179 (H) 70 - 99 mg/dL    Comment: Glucose reference range applies only to samples taken after fasting for at least 8 hours.   BUN 38 (H) 8 - 23 mg/dL   Creatinine, Ser 9.07 0.61 - 1.24 mg/dL   Calcium  9.5 8.9 - 10.3 mg/dL   Total Protein 6.0 (L) 6.5 - 8.1 g/dL   Albumin  3.4 (L) 3.5 - 5.0 g/dL   AST 45 (H) 15 - 41 U/L   ALT 49 (H) 0 - 44 U/L   Alkaline Phosphatase 58 38 - 126 U/L   Total Bilirubin 0.4 0.0 - 1.2 mg/dL   GFR, Estimated >39 >39 mL/min    Comment: (NOTE) Calculated using the CKD-EPI Creatinine Equation (2021)    Anion gap 13 5 - 15    Comment: Performed at [facility], [address] [place], [place] [address]  Protime-INR     Status: Abnormal   Collection Time: 11/11/23  2:17 AM  Result Value Ref Range   Prothrombin Time 15.4 (H) 11.4 - 15.2 seconds   INR 1.2 0.8 - 1.2    Comment: (NOTE) INR goal varies based on device and disease states. Performed at [facility], [address], [place], [place] [address]   Type and screen [facility]     Status: None   Collection Time: 11/11/23  2:17 AM  Result Value Ref Range   ABO/RH(D) O POS    Antibody Screen NEG    Sample Expiration      11/14/2023,2359 Performed at [facility], [address], [place], [place] [address]   Lipase, blood     Status: Abnormal    Collection Time: 11/11/23  2:17 AM  Result Value Ref Range   Lipase 54 (H) 11 - 51 U/L    Comment: Performed at [facility], [address], [place], [place] [address]  Resp panel by RT-PCR (RSV, Flu A&B, Covid) Anterior Nasal Swab     Status: None   Collection Time: 11/11/23  2:20 AM   Specimen: Anterior Nasal Swab  Result Value Ref Range   SARS Coronavirus 2 by RT PCR NEGATIVE NEGATIVE    Comment: (NOTE) SARS-CoV-2 target nucleic acids are NOT DETECTED.  The SARS-CoV-2 RNA is generally detectable in upper respiratory specimens during the acute phase of infection. The lowest concentration of SARS-CoV-2 viral copies this assay can detect is 138 copies/mL. A negative result does not preclude SARS-Cov-2 infection and should not be used as the sole basis for treatment or other patient management decisions. A negative result may occur with  improper specimen collection/handling, submission of specimen other than nasopharyngeal swab, presence of viral mutation(s) within the areas targeted by this assay, and inadequate number of viral copies(<138 copies/mL). A negative result must be combined with clinical observations, patient history, and epidemiological information. The expected result is Negative.  Fact Sheet for Patients:  BloggerCourse.com  Fact Sheet for Healthcare Providers:  SeriousBroker.it  This test is no t yet approved or cleared by the [place]  FDA and  has been authorized for detection and/or diagnosis of SARS-CoV-2 by FDA under an Emergency Use Authorization (EUA). This EUA will remain  in effect (meaning this test can be used) for the duration of the COVID-19 declaration under Section 564(b)(1) of the Act, 21 U.S.C.section 360bbb-3(b)(1),  unless the authorization is terminated  or revoked sooner.       Influenza A by PCR NEGATIVE NEGATIVE   Influenza B by PCR NEGATIVE NEGATIVE    Comment:  (NOTE) The Xpert Xpress SARS-CoV-2/FLU/RSV plus assay is intended as an aid in the diagnosis of influenza from Nasopharyngeal swab specimens and should not be used as a sole basis for treatment. Nasal washings and aspirates are unacceptable for Xpert Xpress SARS-CoV-2/FLU/RSV testing.  Fact Sheet for Patients: BloggerCourse.com  Fact Sheet for Healthcare Providers: SeriousBroker.it  This test is not yet approved or cleared by the [place]  FDA and has been authorized for detection and/or diagnosis of SARS-CoV-2 by FDA under an Emergency Use Authorization (EUA). This EUA will remain in effect (meaning this test can be used) for the duration of the COVID-19 declaration under Section 564(b)(1) of the Act, 21 U.S.C. section 360bbb-3(b)(1), unless the authorization is terminated or revoked.     Resp Syncytial Virus by PCR NEGATIVE NEGATIVE    Comment: (NOTE) Fact Sheet for Patients: BloggerCourse.com  Fact Sheet for Healthcare Providers: SeriousBroker.it  This test is not yet approved or cleared by the [place]  FDA and has been authorized for detection and/or diagnosis of SARS-CoV-2 by FDA under an Emergency Use Authorization (EUA). This EUA will remain in effect (meaning this test can be used) for the duration of the COVID-19 declaration under Section 564(b)(1) of the Act, 21 U.S.C. section 360bbb-3(b)(1), unless the authorization is terminated or revoked.  Performed at [facility], [address], [place], [place] [address]    CT ANGIO GI BLEED Addendum Date: 11/11/2023 ADDENDUM REPORT: 11/11/2023 05:28 ADDENDUM: #2 was discussed by telephone with PA [facility] SPONSELLER on 11/11/2023 at 05:28. At 0521 hours. Electronically Signed   By: VEAR Hurst M.D.   On: 11/11/2023 05:28   Result Date: 11/11/2023 CLINICAL DATA:  89 year old male with coffee-ground  emesis. Chronic intrathoracic stomach. EXAM: CTA ABDOMEN AND PELVIS WITHOUT AND WITH CONTRAST TECHNIQUE: Multidetector CT imaging of the abdomen and pelvis was performed using the standard protocol during bolus administration of intravenous contrast. Multiplanar reconstructed images and MIPs were obtained and reviewed to evaluate the vascular anatomy. RADIATION DOSE REDUCTION: This exam was performed according to the departmental dose-optimization program which includes automated exposure control, adjustment of the mA and/or kV according to patient size and/or use of iterative reconstruction technique. CONTRAST:  OMNIPAQUE  IOHEXOL  350 MG/ML SOLN COMPARISON:  CT chest today reported separately. CT Abdomen and Pelvis 07/28/2021. FINDINGS: VASCULAR Extensive Aortoiliac calcified atherosclerosis. Normal caliber abdominal aorta. Major arterial structures in the abdomen and pelvis remain patent despite the atherosclerosis. On delayed phase the portal venous system appears to remain patent. There is some mass effect on the splenic vein from the gastric abnormality, but splenic vein remains patent. No active contrast extravasation into bowel lumen is identified on this exam. Review of the MIP images confirms the above findings. NON-VASCULAR Lower chest: Detailed separately today, with dilated and obstructed stomach secondary to a gastric volvulus redemonstrated. Pre contrast, arterial and portal venous phase postcontrast images are provided. The entire intrathoracic portion of the dilated stomach is not included on these images. But no contrast extravasation into the stomach visible stomach is identified. See additional details on chest CT today. Hepatobiliary: Chronic gallstones. No acute liver or gallbladder finding. Pancreas: Architectural distortion from the gastric abnormality, otherwise negative. Spleen: Spleen is stable and within normal limits for age, with mild post granulomatous calcifications and mildly  heterogeneous enhancement. No  splenomegaly. Adrenals/Urinary Tract: Negative adrenal glands. Negative right kidney. Chronic left renal double-J ureteral stent, appears stable since 2023. Chronic urinary bladder wall thickening. Stomach/Bowel: Due to gastric volvulus as detailed separately on chest CT today. The compressed gastric outlet and proximal duodenum is redemonstrated on series 18, image 20. From there the duodenum is within normal limits. Nondilated jejunum. Nondilated distal small bowel. Redundant large bowel with diverticulosis and retained stool. But most large bowel loops are decompressed, including the splenic flexure and the transverse colon. Furthermore, the mid transverse colon also appears to be herniated and mildly twisted above the diaphragm in conjunction with the gastric hernia and volvulus. See coronal images 92 (proximal) and 98 (distal). But the upstream right colon is nondilated. No pneumoperitoneum.  No free fluid in the abdomen. Moderate size stool ball in the rectum, new since 2023. Lymphatic: No lymphadenopathy identified. Reproductive: Negative. Other: Chronic left pelvic sidewall and presacral stranding is stable since 2023. no acute pelvic free fluid. Musculoskeletal: Chronic hyperostosis related thoracic spinal ankylosis. Chronic L5 compression fracture is stable since 2023. Superimposed degenerative and/or posttraumatic lower lumbar facet ankylosis. No acute osseous abnormality identified. IMPRESSION: 1. Acute Gastric Outlet Obstruction secondary to Gastric Volvulus in the setting of chronic large hiatal hernia and intrathoracic stomach. See Chest CT today for additional details of the stomach. 2. NOTE ALSO the midline segment of the Transverse Colon also appears to be herniated and twisted in conjunction with #1. However, there are no dilated or obstructed bowel loops at this time distal to the stomach. No pneumoperitoneum or free fluid. 3. No active gastrointestinal bleeding  identified by CTA. 4. No other acute finding in the abdomen or pelvis. Advanced large bowel diverticulosis. Advanced Aortic Atherosclerosis (ICD10-I70.0). Chronic left renal double-J ureteral stent. Chronic L5 compression fracture. Electronically Signed: By: VEAR Hurst M.D. On: 11/11/2023 05:02   CT Chest W Contrast Addendum Date: 11/11/2023 ADDENDUM REPORT: 11/11/2023 05:08 ADDENDUM: Study discussed by telephone with PA [facility] SPONSELLER on 11/11/2023 at 0452 hours. Electronically Signed   By: VEAR Hurst M.D.   On: 11/11/2023 05:08   Result Date: 11/11/2023 CLINICAL DATA:  89 year old male with coffee-ground emesis. Chronic intrathoracic stomach. EXAM: CT CHEST WITH CONTRAST TECHNIQUE: Multidetector CT imaging of the chest was performed during intravenous contrast administration. RADIATION DOSE REDUCTION: This exam was performed according to the departmental dose-optimization program which includes automated exposure control, adjustment of the mA and/or kV according to patient size and/or use of iterative reconstruction technique. CONTRAST:  OMNIPAQUE  IOHEXOL  350 MG/ML SOLN COMPARISON:  Prior CT Abdomen and Pelvis 07/28/2021. Prior chest CT 03/12/2013. CTA Abdomen and Pelvis today reported separately. FINDINGS: Cardiovascular: Chronic sternotomy, CABG. Chronic atherosclerosis and tortuosity of the thoracic aorta, stable since 2015. Mild cardiomegaly appears increased. No pericardial effusion. Central pulmonary arteries grossly patent. Mediastinum/Nodes: Abnormally dilated gas and fluid containing esophagus which tapers to the gastroesophageal junction. Chronic intrathoracic stomach. New since 07/28/2021 the stomach is diffusely dilated, fluid-filled, and twisted such that a gastric volvulus is present. Previously the gastric antrum was intrathoracic. Now up portion of dilated gas and fluid containing stomach is subdiaphragmatic, and the gastric outlet is severely compressed as seen on series 2, images 54  through 62 and coronal images 54 through 65. There is a small volume of edema or free fluid in the posterior mesentery. No superimposed mediastinal mass or lymphadenopathy. There are small chronic post granulomatous calcified mediastinal and hilar lymph nodes. Lungs/Pleura: Chronic left lung base architectural distortion from intrathoracic stomach. Central airways  remain patent but there is left lower lobe consolidation now in the setting of gastric distension. Additional sub solid peribronchovascular nodularity in the right upper lobe (series 4, images 48 through 62). Contralateral left upper lobe mostly dependent ground-glass opacity more resembles atelectasis. No pneumothorax. Small volume mildly complex layering fluid in the left hemithorax could be in the posterior mediastinum or the pleural space on series 2, image 33. There is no drainable pleural effusion. Upper Abdomen: Dilated gas and fluid containing distal stomach subjacent to the diaphragm now with twisted and compressed gastric outlet (series 2, image 54). No pneumoperitoneum is identified. There is a small volume of left quadrant ascites. Otherwise see CTA Abdomen and Pelvis reported separately. Musculoskeletal: Chronically exaggerated thoracic kyphosis with hyperostosis related widespread thoracic spinal ankylosis. This is similar to, mildly progressed from the 2015 CT. Chronic sternotomy. No acute or suspicious osseous lesion identified. IMPRESSION: 1. Positive for Acute Gastric Volvulus in the setting of chronic intrathoracic stomach from large chronic hiatal hernia. Deedra this is an organo-axial volvulus. Dilated air and fluid containing stomach and esophagus which - rather than fully intrathoracic as seen in 2023 - distal stomach now is subdiaphragmatic, with twisted, compressed and obstructed gastric outlet (series 2, image 54). 2. Small volume of free fluid in the posterior mediastinum and/or left hemithorax. No pneumomediastinum. No  pneumoperitoneum in the visible upper abdomen (see also CTA Abdomen and Pelvis today reported separately.). 3. Associated early bilateral lung infection with right upper lobe dependent bronchopneumonia and left lower lobe atelectasis versus consolidation. Electronically Signed: By: VEAR Hurst M.D. On: 11/11/2023 04:47      Assessment/Plan Chronic hiatal hernia now with acute organoaxial volvulus   Given his multiple medical problems and advanced age/dementia, I would ask GI to endoscopically decompress this patient and then attempt non-operative management with gradual diet advancement. He is at risk for recurrence and, if he fails a PO trial due to recurrence, would need a serious risk/benefit discussion with the patient and his family prior to proceeding with surgical repair or laparoscopic reduction/gastropexy.   Below per [facility]--  Afib - s/p maze procedure CAD s/p CABG CHF Prostate cancer Diverticulosis  OA PMH frequent falls   I reviewed nursing notes, Consultant GI notes, hospitalist notes, last 24 h vitals and pain scores, last 48 h intake and output, last 24 h labs and trends, and last 24 h imaging results.  Almarie GORMAN Pringle, [facility] Surgery 11/11/2023, 8:08 AM Please see Amion for pager number during day hours 7:00am-4:30pm or 7:00am -11:30am on weekends

## 2023-11-11 NOTE — Consult Note (Signed)
 Reason for Consult: Gastric volvulus Referring Physician: [facility]  Jerilynn FORBES [name]. HPI: This is a 89 year old male with a PMH of severe dementia, CAD, mitral valve repair, history of prostate cancer, HTN, afib, and hyperlipidemia admitted for projectile vomiting with coffee-ground emesis.  The patient was noted to have an acute onset of the projectile vomiting.  The history was obtained from the chart as the patient has severe dementia.  Imaging of his chest and abdomen reveals a worsening of his hiatal hernia with resultant organo-axial gastric volvulus.  The majority of the stomach is not intrathoracic.  Past Medical History:  Diagnosis Date   Complication of anesthesia    serious cognisance problems since my OR in January (03/13/2013   Coronary artery disease    Diverticulosis    Hiatal hernia    Hx of echocardiogram    Echo 5/16:  Mild focal basal septal hypertrophy, EF 55%, mild AI, MV repair ok with trivial MR (mean 3 mmHg), mild LAE, mild RAE, PASP 35 mmHg   Hypertension    Hypothyroidism    Irritable bowel syndrome    PAF (paroxysmal atrial fibrillation) (HCC) 05/03/2014   Personal history of colonic polyps    tubular adenoma   Pleural effusion, bilateral 03/12/2013   Small to moderate, L>R   Prostate cancer [facility]) 1991   sees Dr. Narvis at [facility], observation only    S/P CABG x 2 02/28/2013   LIMA to LAD, SVG to OM1, EVH via right thigh   S/P Maze operation for atrial fibrillation 02/28/2013   Complete bilateral atrial lesion set using bipolar radiofrequency and cryothermy ablation with clipping of LA appendage   S/P mitral valve repair, maze procedure, and CABG x2 02/28/2013   Complex valvuloplasty including triangular resection of posterior leaflet, 30 mm Sorin Memo 3D ring annuloplasty    Past Surgical History:  Procedure Laterality Date   CARDIAC CATHETERIZATION  2015   CATARACT EXTRACTION, BILATERAL Bilateral 2014   per Dr. Roz    COLONOSCOPY  2011    per Dr. Jakie, benign polyps, no repeats needed    CORONARY ARTERY BYPASS GRAFT N/A 02/28/2013   Procedure: CORONARY ARTERY BYPASS GRAFTING (CABG);  Surgeon: Sudie VEAR Laine, MD;  Location: [facility] OR;  Service: Open Heart Surgery;  Laterality: N/A;  CABG x 2,  using left internal mammary artery and right leg saphenous vein harvested endoscopically   ESOPHAGOGASTRODUODENOSCOPY (EGD) WITH PROPOFOL  N/A 01/11/2019   Procedure: ESOPHAGOGASTRODUODENOSCOPY (EGD) WITH PROPOFOL ;  Surgeon: Legrand Victory LITTIE DOUGLAS, MD;  Location: [facility];  Service: Gastroenterology;  Laterality: N/A;   HEMORRHOID SURGERY     INTRAOPERATIVE TRANSESOPHAGEAL ECHOCARDIOGRAM N/A 02/28/2013   Procedure: INTRAOPERATIVE TRANSESOPHAGEAL ECHOCARDIOGRAM;  Surgeon: Sudie VEAR Laine, MD;  Location: [facility] OR;  Service: Open Heart Surgery;  Laterality: N/A;   LEFT HEART CATHETERIZATION WITH CORONARY ANGIOGRAM N/A 02/26/2013   Procedure: LEFT HEART CATHETERIZATION WITH CORONARY ANGIOGRAM;  Surgeon: Alm LELON Clay, MD;  Location[facility] CATH LAB;  Service: Cardiovascular;  Laterality: N/A;   MAZE N/A 02/28/2013   Procedure: MAZE;  Surgeon: Sudie VEAR Laine, MD;  Location: [facility] OR;  Service: Open Heart Surgery;  Laterality: N/A;   MITRAL VALVE REPAIR N/A 02/28/2013   Procedure: MITRAL VALVE REPAIR (MVR);  Surgeon: Sudie VEAR Laine, MD;  Location: [facility] OR;  Service: Open Heart Surgery;  Laterality: N/A;   PROSTATE BIOPSY     TRANSURETHRAL RESECTION OF PROSTATE N/A 02/04/2017   Procedure: TRANSURETHRAL RESECTION OF THE PROSTATE (TURP);  Surgeon: Alvaro Hummer, MD;  Location: [facility] ORS;  Service: Urology;  Laterality: N/A;    Family History  Problem Relation Age of Onset   Hypertension Mother    Sudden death Mother    Stroke Mother    Heart disease Mother    Hypertension Father    Sudden death Father    Stroke Father    Heart disease Father    Heart disease Son    Diabetes Son    Lung cancer Brother    Stomach cancer Brother    Cancer Daughter         rebdomyosarcoma   Kidney cancer Brother        mets   Diabetes Sister     Social History:  reports that he has never smoked. He has never used smokeless tobacco. He reports that he does not drink alcohol and does not use drugs.  Allergies:  Allergies  Allergen Reactions   Other Other (See Comments)    ANESTHESIA.... 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 ANESTHESIA.... 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 Occurred post op CABG 2015, required readmission and rehab    Medications: Scheduled: Continuous:  Results for orders placed or performed during the hospital encounter of 11/11/23 (from the past 24 hours)  CBC with Differential     Status: Abnormal   Collection Time: 11/11/23  2:17 AM  Result Value Ref Range   WBC 7.6 4.0 - 10.5 K/uL   RBC 3.22 (L) 4.22 - 5.81 MIL/uL   Hemoglobin 9.7 (L) 13.0 - 17.0 g/dL   HCT 68.4 (L) 60.9 - 47.9 %   MCV 97.8 80.0 - 100.0 fL   MCH 30.1 26.0 - 34.0 pg   MCHC 30.8 30.0 - 36.0 g/dL   RDW 85.7 88.4 - 84.4 %   Platelets 197 150 - 400 K/uL   nRBC 0.0 0.0 - 0.2 %   Neutrophils Relative % 83 %   Neutro Abs 6.2 1.7 - 7.7 K/uL   Lymphocytes Relative 8 %   Lymphs Abs 0.6 (L) 0.7 - 4.0 K/uL   Monocytes Relative 9 %   Monocytes Absolute 0.7 0.1 - 1.0 K/uL   Eosinophils Relative 0 %   Eosinophils Absolute 0.0 0.0 - 0.5 K/uL   Basophils Relative 0 %   Basophils Absolute 0.0 0.0 - 0.1 K/uL   Immature Granulocytes 0 %   Abs Immature Granulocytes 0.03 0.00 - 0.07 K/uL  Comprehensive metabolic panel     Status: Abnormal   Collection Time: 11/11/23  2:17 AM  Result Value Ref Range   Sodium 146 (H) 135 - 145 mmol/L   Potassium 4.0 3.5 - 5.1 mmol/L   Chloride 104 98 - 111 mmol/L   CO2 29 22 - 32 mmol/L   Glucose, Bld 179 (H) 70 - 99 mg/dL   BUN 38 (H) 8 - 23 mg/dL   Creatinine, Ser 9.07 0.61 - 1.24 mg/dL   Calcium  9.5 8.9 - 10.3 mg/dL   Total Protein 6.0 (L) 6.5 - 8.1 g/dL   Albumin  3.4 (L) 3.5 - 5.0 g/dL    AST 45 (H) 15 - 41 U/L   ALT 49 (H) 0 - 44 U/L   Alkaline Phosphatase 58 38 - 126 U/L   Total Bilirubin 0.4 0.0 - 1.2 mg/dL   GFR, Estimated >39 >39 mL/min   Anion gap 13 5 - 15  Protime-INR     Status: Abnormal   Collection Time: 11/11/23  2:17  AM  Result Value Ref Range   Prothrombin Time 15.4 (H) 11.4 - 15.2 seconds   INR 1.2 0.8 - 1.2  Type and screen [facility]     Status: None   Collection Time: 11/11/23  2:17 AM  Result Value Ref Range   ABO/RH(D) O POS    Antibody Screen NEG    Sample Expiration      11/14/2023,2359 Performed at [facility], [address], [place], [place] [address]   Lipase, blood     Status: Abnormal   Collection Time: 11/11/23  2:17 AM  Result Value Ref Range   Lipase 54 (H) 11 - 51 U/L  Resp panel by RT-PCR (RSV, Flu A&B, Covid) Anterior Nasal Swab     Status: None   Collection Time: 11/11/23  2:20 AM   Specimen: Anterior Nasal Swab  Result Value Ref Range   SARS Coronavirus 2 by RT PCR NEGATIVE NEGATIVE   Influenza A by PCR NEGATIVE NEGATIVE   Influenza B by PCR NEGATIVE NEGATIVE   Resp Syncytial Virus by PCR NEGATIVE NEGATIVE     CT ANGIO GI BLEED Addendum Date: 11/11/2023 ADDENDUM REPORT: 11/11/2023 05:28 ADDENDUM: #2 was discussed by telephone with PA [facility] SPONSELLER on 11/11/2023 at 05:28. At 0521 hours. Electronically Signed   By: VEAR Hurst M.D.   On: 11/11/2023 05:28   Result Date: 11/11/2023 CLINICAL DATA:  89 year old male with coffee-ground emesis. Chronic intrathoracic stomach. EXAM: CTA ABDOMEN AND PELVIS WITHOUT AND WITH CONTRAST TECHNIQUE: Multidetector CT imaging of the abdomen and pelvis was performed using the standard protocol during bolus administration of intravenous contrast. Multiplanar reconstructed images and MIPs were obtained and reviewed to evaluate the vascular anatomy. RADIATION DOSE REDUCTION: This exam was performed according to the departmental dose-optimization program which  includes automated exposure control, adjustment of the mA and/or kV according to patient size and/or use of iterative reconstruction technique. CONTRAST:  100mL OMNIPAQUE  IOHEXOL  350 MG/ML SOLN COMPARISON:  CT chest today reported separately. CT Abdomen and Pelvis 07/28/2021. FINDINGS: VASCULAR Extensive Aortoiliac calcified atherosclerosis. Normal caliber abdominal aorta. Major arterial structures in the abdomen and pelvis remain patent despite the atherosclerosis. On delayed phase the portal venous system appears to remain patent. There is some mass effect on the splenic vein from the gastric abnormality, but splenic vein remains patent. No active contrast extravasation into bowel lumen is identified on this exam. Review of the MIP images confirms the above findings. NON-VASCULAR Lower chest: Detailed separately today, with dilated and obstructed stomach secondary to a gastric volvulus redemonstrated. Pre contrast, arterial and portal venous phase postcontrast images are provided. The entire intrathoracic portion of the dilated stomach is not included on these images. But no contrast extravasation into the stomach visible stomach is identified. See additional details on chest CT today. Hepatobiliary: Chronic gallstones. No acute liver or gallbladder finding. Pancreas: Architectural distortion from the gastric abnormality, otherwise negative. Spleen: Spleen is stable and within normal limits for age, with mild post granulomatous calcifications and mildly heterogeneous enhancement. No splenomegaly. Adrenals/Urinary Tract: Negative adrenal glands. Negative right kidney. Chronic left renal double-J ureteral stent, appears stable since 2023. Chronic urinary bladder wall thickening. Stomach/Bowel: Due to gastric volvulus as detailed separately on chest CT today. The compressed gastric outlet and proximal duodenum is redemonstrated on series 18, image 20. From there the duodenum is within normal limits. Nondilated  jejunum. Nondilated distal small bowel. Redundant large bowel with diverticulosis and retained stool. But most large bowel loops are  decompressed, including the splenic flexure and the transverse colon. Furthermore, the mid transverse colon also appears to be herniated and mildly twisted above the diaphragm in conjunction with the gastric hernia and volvulus. See coronal images 92 (proximal) and 98 (distal). But the upstream right colon is nondilated. No pneumoperitoneum.  No free fluid in the abdomen. Moderate size stool ball in the rectum, new since 2023. Lymphatic: No lymphadenopathy identified. Reproductive: Negative. Other: Chronic left pelvic sidewall and presacral stranding is stable since 2023. no acute pelvic free fluid. Musculoskeletal: Chronic hyperostosis related thoracic spinal ankylosis. Chronic L5 compression fracture is stable since 2023. Superimposed degenerative and/or posttraumatic lower lumbar facet ankylosis. No acute osseous abnormality identified. IMPRESSION: 1. Acute Gastric Outlet Obstruction secondary to Gastric Volvulus in the setting of chronic large hiatal hernia and intrathoracic stomach. See Chest CT today for additional details of the stomach. 2. NOTE ALSO the midline segment of the Transverse Colon also appears to be herniated and twisted in conjunction with #1. However, there are no dilated or obstructed bowel loops at this time distal to the stomach. No pneumoperitoneum or free fluid. 3. No active gastrointestinal bleeding identified by CTA. 4. No other acute finding in the abdomen or pelvis. Advanced large bowel diverticulosis. Advanced Aortic Atherosclerosis (ICD10-I70.0). Chronic left renal double-J ureteral stent. Chronic L5 compression fracture. Electronically Signed: By: VEAR Hurst M.D. On: 11/11/2023 05:02   CT Chest W Contrast Addendum Date: 11/11/2023 ADDENDUM REPORT: 11/11/2023 05:08 ADDENDUM: Study discussed by telephone with PA [facility] SPONSELLER on 11/11/2023 at 0452  hours. Electronically Signed   By: VEAR Hurst M.D.   On: 11/11/2023 05:08   Result Date: 11/11/2023 CLINICAL DATA:  89 year old male with coffee-ground emesis. Chronic intrathoracic stomach. EXAM: CT CHEST WITH CONTRAST TECHNIQUE: Multidetector CT imaging of the chest was performed during intravenous contrast administration. RADIATION DOSE REDUCTION: This exam was performed according to the departmental dose-optimization program which includes automated exposure control, adjustment of the mA and/or kV according to patient size and/or use of iterative reconstruction technique. CONTRAST:  OMNIPAQUE  IOHEXOL  350 MG/ML SOLN COMPARISON:  Prior CT Abdomen and Pelvis 07/28/2021. Prior chest CT 03/12/2013. CTA Abdomen and Pelvis today reported separately. FINDINGS: Cardiovascular: Chronic sternotomy, CABG. Chronic atherosclerosis and tortuosity of the thoracic aorta, stable since 2015. Mild cardiomegaly appears increased. No pericardial effusion. Central pulmonary arteries grossly patent. Mediastinum/Nodes: Abnormally dilated gas and fluid containing esophagus which tapers to the gastroesophageal junction. Chronic intrathoracic stomach. New since 07/28/2021 the stomach is diffusely dilated, fluid-filled, and twisted such that a gastric volvulus is present. Previously the gastric antrum was intrathoracic. Now up portion of dilated gas and fluid containing stomach is subdiaphragmatic, and the gastric outlet is severely compressed as seen on series 2, images 54 through 62 and coronal images 54 through 65. There is a small volume of edema or free fluid in the posterior mesentery. No superimposed mediastinal mass or lymphadenopathy. There are small chronic post granulomatous calcified mediastinal and hilar lymph nodes. Lungs/Pleura: Chronic left lung base architectural distortion from intrathoracic stomach. Central airways remain patent but there is left lower lobe consolidation now in the setting of gastric distension.  Additional sub solid peribronchovascular nodularity in the right upper lobe (series 4, images 48 through 62). Contralateral left upper lobe mostly dependent ground-glass opacity more resembles atelectasis. No pneumothorax. Small volume mildly complex layering fluid in the left hemithorax could be in the posterior mediastinum or the pleural space on series 2, image 33. There is no drainable pleural effusion. Upper Abdomen:  Dilated gas and fluid containing distal stomach subjacent to the diaphragm now with twisted and compressed gastric outlet (series 2, image 54). No pneumoperitoneum is identified. There is a small volume of left quadrant ascites. Otherwise see CTA Abdomen and Pelvis reported separately. Musculoskeletal: Chronically exaggerated thoracic kyphosis with hyperostosis related widespread thoracic spinal ankylosis. This is similar to, mildly progressed from the 2015 CT. Chronic sternotomy. No acute or suspicious osseous lesion identified. IMPRESSION: 1. Positive for Acute Gastric Volvulus in the setting of chronic intrathoracic stomach from large chronic hiatal hernia. Deedra this is an organo-axial volvulus. Dilated air and fluid containing stomach and esophagus which - rather than fully intrathoracic as seen in 2023 - distal stomach now is subdiaphragmatic, with twisted, compressed and obstructed gastric outlet (series 2, image 54). 2. Small volume of free fluid in the posterior mediastinum and/or left hemithorax. No pneumomediastinum. No pneumoperitoneum in the visible upper abdomen (see also CTA Abdomen and Pelvis today reported separately.). 3. Associated early bilateral lung infection with right upper lobe dependent bronchopneumonia and left lower lobe atelectasis versus consolidation. Electronically Signed: By: VEAR Hurst M.D. On: 11/11/2023 04:47    ROS:  As stated above in the HPI otherwise negative.  Blood pressure 133/83, pulse 77, temperature 98.1 F (36.7 C), temperature source Oral, resp.  rate 16, SpO2 98%.    PE: Gen: NAD, Alert and Oriented HEENT:  [place]/AT, EOMI Neck: Supple, no LAD Lungs: CTA Bilaterally CV: RRR without M/G/R ABD: Soft, NTND, +BS Ext: No C/C/E  Assessment/Plan: 1) Organoaxial volvulus. 2) Anemia. 3) Severe dementia. 4) DNR/DNI status.   The patient is clinically stable.  The images of the intrathoracic gastric volvulus is impressive.  There appears to be a twisted portion of his distal stomach in the subdaphragmatic region.  From the GI standpoint, a decompression can be offered, but there will be no resolution to his situation.  Surgery will evaluate the patient, but his medical status is poor.  Decompression may only be a palliative measure.  Plan: 1) Await surgical input. 2) Tentatively plan for an EGD with decompression today.  Sahand Gosch D 11/11/2023, 7:24 AM

## 2023-11-11 NOTE — ED Triage Notes (Signed)
 PT BIB EMS coming from [facility]. Per EMS, pt had projectile vomiting with coffee ground emesis x 4 times at the facility that started 2 hrs ago. Not on thinners. SABRAHx: Afib and frequent falls, dementia. Alert to baseline. Patient unable to respond to questions.

## 2023-11-11 NOTE — H&P (Signed)
 History and Physical    Patient: [name]. FMW:[id number] DOB: 17-Apr-1927 DOA: 11/11/2023 DOS: the patient was seen and examined on 11/11/2023 PCP: Sherlynn Madden, MD  Patient coming from: SNF  Chief Complaint:  Chief Complaint  Patient presents with   GI Bleeding   Emesis   HPI: [name]. is a 89 y.o. male with medical history significant of frequent falls, CAD, CABG, chronic diastolic heart failure, paroxysmal atrial fibrillation, s/p maze operation for atrial fibrillation, mitral valve disease, status post MVR, presyncope, dementia, osteoarthritis, bilateral pleural effusion, prostate cancer, diverticulosis, hematuria, history of GI bleed, vitamin B12 deficiency who was brought from his nursing facility due to coffee-ground emesis x 4 that started 2 hours prior to arrival to the emergency department.  He is unable to provide further information at this time given dementia and sedation.  ED course: Initial vital signs were temperature 98.2 F, pulse 86, respiration 19, BP 146/74 mmHg O2 sat 98% on room air.  The patient was subsequently placed on nasal cannula oxygen.  He had an episode of the saturation of 88%.  He received 3 g of Unasyn , lorazepam  1 mg IVP, ondansetron  4 mg IVP and pantoprazole  40 mg IVP.  Lab work: CBC showed a white count of 5.6, normal 9.7 g/dL and platelets 802.  PT 15.4 and INR 1.2.  Lipase mildly elevated at 54 units/L.  Coronavirus, influenza and RSV PCR test was negative.  CMP showed a corrected sodium of 146 mmol/L, the rest of the electrolytes were normal.  Glucose 179, BUN 38 and creatinine 0.92 mg/dL.  Total protein 6.0 and albumin  3.4 g/dL.  AST 45 ALT 49 units/L.  Normal alkaline phosphatase and total bilirubin.  Imaging: CTA GI bleed with acute gastric outlet obstruction secondary to gastric volvulus due to a chronic large hiatal hernia and intrathoracic stomach.  The midline segment of the transverse colon also appears to be herniated  and twisted in conjunction with #1.  No active gastrointestinal bleeding.  Advanced colonic diverticulosis.  Advanced aortic atherosclerosis.  CT chest with contrast showing small volume of free fluid in the posterior mediastinum and/or left hemithorax.  No pneumomediastinum.  No pneumoperitoneum in the visible upper abdomen associated air leak bilateral lung infection with right upper lobe dependent bronchopneumonia and left lower lobe atelectasis versus consolidation.   Review of Systems: As mentioned in the history of present illness. All other systems reviewed and are negative.  Past Medical History:  Diagnosis Date   Complication of anesthesia    serious cognisance problems since my OR in January (03/13/2013   Coronary artery disease    Diverticulosis    Hiatal hernia    Hx of echocardiogram    Echo 5/16:  Mild focal basal septal hypertrophy, EF 55%, mild AI, MV repair ok with trivial MR (mean 3 mmHg), mild LAE, mild RAE, PASP 35 mmHg   Hypertension    Hypothyroidism    Irritable bowel syndrome    PAF (paroxysmal atrial fibrillation) (HCC) 05/03/2014   Personal history of colonic polyps    tubular adenoma   Pleural effusion, bilateral 03/12/2013   Small to moderate, L>R   Prostate cancer [facility]) 1991   sees Dr. Narvis at [facility], observation only    S/P CABG x 2 02/28/2013   LIMA to LAD, SVG to OM1, EVH via right thigh   S/P Maze operation for atrial fibrillation 02/28/2013   Complete bilateral atrial lesion set using bipolar radiofrequency and cryothermy ablation with clipping of  LA appendage   S/P mitral valve repair, maze procedure, and CABG x2 02/28/2013   Complex valvuloplasty including triangular resection of posterior leaflet, 30 mm Sorin Memo 3D ring annuloplasty   Past Surgical History:  Procedure Laterality Date   CARDIAC CATHETERIZATION  2015   CATARACT EXTRACTION, BILATERAL Bilateral 2014   per Dr. Roz    COLONOSCOPY  2011   per Dr. Jakie, benign polyps, no  repeats needed    CORONARY ARTERY BYPASS GRAFT N/A 02/28/2013   Procedure: CORONARY ARTERY BYPASS GRAFTING (CABG);  Surgeon: Sudie VEAR Laine, MD;  Location: [facility] OR;  Service: Open Heart Surgery;  Laterality: N/A;  CABG x 2,  using left internal mammary artery and right leg saphenous vein harvested endoscopically   ESOPHAGOGASTRODUODENOSCOPY (EGD) WITH PROPOFOL  N/A 01/11/2019   Procedure: ESOPHAGOGASTRODUODENOSCOPY (EGD) WITH PROPOFOL ;  Surgeon: Legrand Victory LITTIE DOUGLAS, MD;  Location: [facility] ENDOSCOPY;  Service: Gastroenterology;  Laterality: N/A;   HEMORRHOID SURGERY     INTRAOPERATIVE TRANSESOPHAGEAL ECHOCARDIOGRAM N/A 02/28/2013   Procedure: INTRAOPERATIVE TRANSESOPHAGEAL ECHOCARDIOGRAM;  Surgeon: Sudie VEAR Laine, MD;  Location: [facility] OR;  Service: Open Heart Surgery;  Laterality: N/A;   LEFT HEART CATHETERIZATION WITH CORONARY ANGIOGRAM N/A 02/26/2013   Procedure: LEFT HEART CATHETERIZATION WITH CORONARY ANGIOGRAM;  Surgeon: Alm LELON Clay, MD;  Location: [facility] CATH LAB;  Service: Cardiovascular;  Laterality: N/A;   MAZE N/A 02/28/2013   Procedure: MAZE;  Surgeon: Sudie VEAR Laine, MD;  Location: [facility] OR;  Service: Open Heart Surgery;  Laterality: N/A;   MITRAL VALVE REPAIR N/A 02/28/2013   Procedure: MITRAL VALVE REPAIR (MVR);  Surgeon: Sudie VEAR Laine, MD;  Location: [facility] OR;  Service: Open Heart Surgery;  Laterality: N/A;   PROSTATE BIOPSY     TRANSURETHRAL RESECTION OF PROSTATE N/A 02/04/2017   Procedure: TRANSURETHRAL RESECTION OF THE PROSTATE (TURP);  Surgeon: Alvaro Hummer, MD;  Location: [facility] ORS;  Service: Urology;  Laterality: N/A;   Social History:  reports that he has never smoked. He has never used smokeless tobacco. He reports that he does not drink alcohol and does not use drugs.  Allergies  Allergen Reactions   Other Other (See Comments)    ANESTHESIA.... 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 ANESTHESIA.... 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 Occurred post op  CABG 2015, required readmission and rehab    Family History  Problem Relation Age of Onset   Hypertension Mother    Sudden death Mother    Stroke Mother    Heart disease Mother    Hypertension Father    Sudden death Father    Stroke Father    Heart disease Father    Heart disease Son    Diabetes Son    Lung cancer Brother    Stomach cancer Brother    Cancer Daughter        rebdomyosarcoma   Kidney cancer Brother        mets   Diabetes Sister     Prior to Admission medications   Medication Sig Start Date End Date Taking? Authorizing Provider  acetaminophen  (TYLENOL ) 500 MG tablet Take 2 tablets (1,000 mg total) by mouth 2 (two) times daily as needed. 01/28/22  Yes Fargo, Amy E, NP  atorvastatin  (LIPITOR) 10 MG tablet TAKE 1 TABLET EVERY DAY AT 6PM 12/03/21  Yes Johnny Garnette LABOR, MD  dextromethorphan-guaiFENesin (ROBITUSSIN-DM) 10-100 MG/5ML liquid Take 10 mLs by mouth every 6 (six) hours as needed for cough.   Yes [provider]  divalproex  (DEPAKOTE ) 125 MG DR tablet Take 125 mg by mouth at bedtime.   Yes [provider]  escitalopram (LEXAPRO) 20 MG tablet Take 20 mg by mouth daily.   Yes [provider]  ferrous sulfate  325 (65 FE) MG EC tablet TAKE 1 TABLET BY MOUTH EVERY DAY 12/16/21  Yes Johnny Garnette LABOR, MD  fish oil-omega-3 fatty acids  1000 MG capsule Take 1 g by mouth every morning.   Yes [provider]  hydrocortisone cream 1 % Apply 1 Application topically as needed. For rash on right cheek   Yes [provider]  lactose free nutrition (BOOST) LIQD Take 237 mLs by mouth 3 (three) times daily.   Yes [provider]  levothyroxine  (SYNTHROID ) 50 MCG tablet TAKE 1 TABLET BY MOUTH IN THE MORNING 08/17/21  Yes Johnny Garnette LABOR, MD  lidocaine  (HM LIDOCAINE  PATCH) 4 % Place 1 patch onto the skin See admin instructions. Apply to left shoulder once a day, 12 hours on 12 hours off   Yes [provider]  loperamide  (IMODIUM   A-D) 2 MG tablet Take 4 mg by mouth as needed for diarrhea or loose stools (Max 16 mg in 24 hours). . If >3 loose stools in 24hrs, hold all laxatives/stool softeners. If not effective, notify MD. Do Not give if patient has fever, recent history of CDiff, on an antibiotic,recent use of a   Yes [provider]  nitroGLYCERIN  (NITROSTAT ) 0.4 MG SL tablet Place 1 tablet (0.4 mg total) under the tongue every 5 (five) minutes as needed for chest pain. 03/17/15  Yes Okey Vina GAILS, MD  Nutritional Supplements (NUTRITIONAL SHAKE PO) Take 237 mLs by mouth daily. MIGHTY SHAKES   Yes [provider]  nystatin -triamcinolone  (MYCOLOG II) cream Apply 1 Application topically 2 (two) times daily. 11/08/23  Yes [provider]  omeprazole (PRILOSEC) 20 MG capsule Take 20 mg by mouth daily. 10/31/23  Yes [provider]  QUEtiapine  (SEROQUEL ) 25 MG tablet Take 1 tablet (25 mg total) by mouth daily in the afternoon. Give at 2 pm 08/27/22  Yes Fargo, Amy E, NP  simethicone  (MYLICON) 80 MG chewable tablet Chew 80 mg by mouth every 12 (twelve) hours as needed for flatulence.   Yes [provider]  acetaminophen  (TYLENOL ) 325 MG tablet Take 650 mg by mouth every 4 (four) hours as needed.    [provider]  amoxicillin (AMOXIL) 500 MG capsule Take 1,000 mg by mouth 2 (two) times daily. 11/02/23   [provider]  LORazepam  (ATIVAN ) 2 MG tablet Take 2 mg by mouth at bedtime as needed. 11/02/23   [provider]    Physical Exam: Vitals:   11/11/23 0542 11/11/23 0543 11/11/23 0545 11/11/23 0615  BP:   113/79 133/83  Pulse: 83 83 84 77  Resp:    16  Temp:      TempSrc:      SpO2: 98% 99% 99% 98%   Physical Exam Vitals and nursing note reviewed.  Constitutional:      Appearance: Normal appearance. He is ill-appearing.  HENT:     Head: Normocephalic.     Nose: No rhinorrhea.     Mouth/Throat:     Mouth: Mucous membranes are dry.  Eyes:     General:  No scleral icterus.    Pupils: Pupils are equal, round, and reactive to light.  Cardiovascular:     Rate and Rhythm: Normal rate and regular rhythm.  Pulmonary:  Effort: Pulmonary effort is normal.     Breath sounds: Normal breath sounds. No wheezing, rhonchi or rales.  Abdominal:     General: Bowel sounds are normal.     Palpations: Abdomen is soft.     Tenderness: There is no abdominal tenderness. There is no right CVA tenderness or left CVA tenderness.  Musculoskeletal:     Cervical back: Neck supple.     Right lower leg: No edema.     Left lower leg: No edema.  Skin:    General: Skin is dry.  Neurological:     Mental Status: He is alert. He is disoriented.  Psychiatric:     Comments: Currently sedated.     Data Reviewed:   Results are pending, will review when available. 11/14/2017 transthoracic echocardiogram report. ------------------  Study Conclusions:  - Left ventricle: The cavity size was normal. Wall thickness was    normal. Systolic function was normal. The estimated ejection    fraction was in the range of 55% to 60%. The study is not    technically sufficient to allow evaluation of LV diastolic    function.  - Aortic valve: There was mild stenosis. There was mild    regurgitation.  - Mitral valve: Post repair without significant residual MR.  - Left atrium: The atrium was severely dilated.  - Right atrium: The atrium was mildly dilated.  - Pulmonary arteries: PA peak pressure: 44 mm Hg (S).   EKG: Vent. rate 87 BPM PR interval * ms QRS duration 81 ms QT/QTcB 428/515 ms P-R-T axes * 67 242 Atrial fibrillation Nonspecific T abnormalities, lateral leads Prolonged QT interval  Assessment and Plan: Principal Problem:   Acute on chronic blood loss anemia In the setting of:   Hematemesis Due to:   Incarcerated hiatal hernia Resulting in:   Acute gastric volvulus Admit to stepdown/inpatient. Keep NPO for now. Continue IV fluids. Continue  pantoprazole  40 mg every 12 hours. Monitor hematocrit and hemoglobin. Transfuse as needed. [facility] consult appreciated. GI consult appreciated. Will follow their recommendations.  Active Problems:   GERD Continue PPI as above.    Hypernatremia  Begin half NS IV fluids. Follow sodium level.    Chronic diastolic CHF (congestive heart failure) (HCC) Seems to be volume depleted. Will monitor clinically.    PAF (paroxysmal atrial fibrillation),  CHA2DS2VASc score 5 Age, CHF, HTN, CAD. Not on anticoagulation. Heart rate is controlled.    CAD (coronary artery disease) Not on beta-blocker currently. Will resume atorvastatin  once cleared for oral intake.    Hypothyroidism Resume levothyroxine  once cleared by oral intake. If still n.p.o. tomorrow, will give levothyroxine  IV.    HLD (hyperlipidemia) Once on diet continue atorvastatin  10 mg p.o. daily.    Essential hypertension Monitor blood pressure. As needed antihypertensives.    Dementia, senile with depression,    with behavioral disturbance (HCC) Currently NPO. Will use as needed sedation for agitation.    Transaminitis Likely due to volume depletion. Follow-up tomorrow morning after hydration.    Mild protein malnutrition (HCC) In the setting of anemia/history of prostate CA May benefit from protein supplementation. Consider nutritional services evaluation. Follow-up albumin  level.    Hyperglycemia Currently NPO. Follow CBG every 6 hours.    Advance Care Planning:   Code Status: Full Code   Consults: [facility] and gastroenterology.  Family Communication:   Severity of Illness: The appropriate patient status for this patient is INPATIENT. Inpatient status is judged to be reasonable and  necessary in order to provide the required intensity of service to ensure the patient's safety. The patient's presenting symptoms, physical exam findings, and initial radiographic and  laboratory data in the context of their chronic comorbidities is felt to place them at high risk for further clinical deterioration. Furthermore, it is not anticipated that the patient will be medically stable for discharge from the hospital within 2 midnights of admission.   * I certify that at the point of admission it is my clinical judgment that the patient will require inpatient hospital care spanning beyond 2 midnights from the point of admission due to high intensity of service, high risk for further deterioration and high frequency of surveillance required.*  Author: Alm Dorn Castor, MD 11/11/2023 7:49 AM  For on call review [email or web address].   This document was prepared using Dragon voice recognition software and may contain some unintended transcription errors.

## 2023-11-11 NOTE — Plan of Care (Signed)
 No vomiting since arriving to unit.  Patient's son will be driving from [place]  and hopes to be here by Sunday.  EGD postponed until tomorrow.

## 2023-11-11 NOTE — ED Provider Notes (Signed)
 [facility] EMERGENCY DEPARTMENT AT [facility] Provider Note   CSN: [id number] Arrival date & time: 11/11/23  0139     Patient presents with: GI Bleeding and Emesis   [name]. is a 89 y.o. male who presents from friend's home in nursing facility where he is in the memory care center for end-stage dementia oriented only to himself.  He is at his mental status baseline per facility.  He presents due to projectile vomiting x 4 with concern for coffee-ground emesis this evening that started 2 hours prior to arrival.  He is not anticoagulated despite history of A-fib secondary to frequent falls and dementia.  Patient with history of prostate cancer, hypertension, hyperlipidemia, CAD status post CABG, mitral valve repair.  Collateral history obtained from his son via phone who lives in [place]  and is planning to travel to [place] today.  Patient was signed DNR/DNI at the bedside.  Patient can provide 0 insight into his condition.  Is singing nonsensically at time of my evaluation.   HPI     Prior to Admission medications   Medication Sig Start Date End Date Taking? Authorizing Provider  acetaminophen  (TYLENOL ) 500 MG tablet Take 2 tablets (1,000 mg total) by mouth 2 (two) times daily as needed. 01/28/22  Yes Fargo, Amy E, NP  atorvastatin  (LIPITOR) 10 MG tablet TAKE 1 TABLET EVERY DAY AT 6PM 12/03/21  Yes Johnny Garnette LABOR, MD  dextromethorphan-guaiFENesin (ROBITUSSIN-DM) 10-100 MG/5ML liquid Take 10 mLs by mouth every 6 (six) hours as needed for cough.   Yes [provider]  divalproex  (DEPAKOTE ) 125 MG DR tablet Take 125 mg by mouth at bedtime.   Yes [provider]  escitalopram (LEXAPRO) 20 MG tablet Take 20 mg by mouth daily.   Yes [provider]  ferrous sulfate  325 (65 FE) MG EC tablet TAKE 1 TABLET BY MOUTH EVERY DAY 12/16/21  Yes Johnny Garnette LABOR, MD  fish oil-omega-3 fatty acids  1000 MG capsule Take 1 g by mouth every morning.   Yes [provider]  hydrocortisone cream 1 % Apply 1 Application topically as needed. For rash on right cheek   Yes [provider]  lactose free nutrition (BOOST) LIQD Take 237 mLs by mouth 3 (three) times daily.   Yes [provider]  levothyroxine  (SYNTHROID ) 50 MCG tablet TAKE 1 TABLET BY MOUTH IN THE MORNING 08/17/21  Yes Johnny Garnette LABOR, MD  lidocaine  (HM LIDOCAINE  PATCH) 4 % Place 1 patch onto the skin See admin instructions. Apply to left shoulder once a day, 12 hours on 12 hours off   Yes [provider]  loperamide  (IMODIUM  A-D) 2 MG tablet Take 4 mg by mouth as needed for diarrhea or loose stools (Max 16 mg in 24 hours). . If >3 loose stools in 24hrs, hold all laxatives/stool softeners. If not effective, notify MD. Do Not give if patient has fever, recent history of CDiff, on an antibiotic,recent use of a   Yes [provider]  nitroGLYCERIN  (NITROSTAT ) 0.4 MG SL tablet Place 1 tablet (0.4 mg total) under the tongue every 5 (five) minutes as needed for chest pain. 03/17/15  Yes Okey Vina GAILS, MD  Nutritional Supplements (NUTRITIONAL SHAKE PO) Take 237 mLs by mouth daily. MIGHTY SHAKES   Yes [provider]  nystatin -triamcinolone  (MYCOLOG II) cream Apply 1 Application topically 2 (two) times daily. 11/08/23  Yes [provider]  omeprazole (PRILOSEC) 20 MG capsule Take 20 mg by mouth daily.  10/31/23  Yes [provider]  QUEtiapine  (SEROQUEL ) 25 MG tablet Take 1 tablet (25 mg total) by mouth daily in the afternoon. Give at 2 pm 08/27/22  Yes Fargo, Amy E, NP  simethicone  (MYLICON) 80 MG chewable tablet Chew 80 mg by mouth every 12 (twelve) hours as needed for flatulence.   Yes [provider]  acetaminophen  (TYLENOL ) 325 MG tablet Take 650 mg by mouth every 4 (four) hours as needed.    [provider]  amoxicillin (AMOXIL) 500 MG capsule Take 1,000 mg by mouth 2 (two) times daily. 11/02/23   [provider]   LORazepam  (ATIVAN ) 2 MG tablet Take 2 mg by mouth at bedtime as needed. 11/02/23   [provider]    Allergies: Other    Review of Systems  Unable to perform ROS: Dementia    Updated Vital Signs BP 133/83   Pulse 77   Temp 98.1 F (36.7 C) (Oral)   Resp 16   SpO2 98%   Physical Exam Vitals and nursing note reviewed.  Constitutional:      Appearance: He is not toxic-appearing.  HENT:     Head: Normocephalic and atraumatic.     Mouth/Throat:     Mouth: Mucous membranes are moist.     Pharynx: No oropharyngeal exudate or posterior oropharyngeal erythema.  Eyes:     General:        Right eye: No discharge.        Left eye: No discharge.     Conjunctiva/sclera: Conjunctivae normal.     Pupils: Pupils are equal, round, and reactive to light.  Cardiovascular:     Rate and Rhythm: Normal rate and regular rhythm.  Pulmonary:     Effort: Pulmonary effort is normal. No respiratory distress.     Breath sounds: Normal breath sounds. No wheezing or rales.  Abdominal:     General: Bowel sounds are normal. There is no distension.     Palpations: Abdomen is soft.     Tenderness: There is generalized abdominal tenderness.  Musculoskeletal:        General: No deformity.     Cervical back: Neck supple.     Right lower leg: No edema.     Left lower leg: No edema.  Skin:    General: Skin is warm and dry.     Capillary Refill: Capillary refill takes less than 2 seconds.  Neurological:     Mental Status: He is alert. Mental status is at baseline.  Psychiatric:        Mood and Affect: Mood normal.     (all labs ordered are listed, but only abnormal results are displayed) Labs Reviewed  CBC WITH DIFFERENTIAL/PLATELET - Abnormal; Notable for the following components:      Result Value   RBC 3.22 (*)    Hemoglobin 9.7 (*)    HCT 31.5 (*)    Lymphs Abs 0.6 (*)    All other components within normal limits  COMPREHENSIVE METABOLIC PANEL WITH GFR - Abnormal; Notable for  the following components:   Sodium 146 (*)    Glucose, Bld 179 (*)    BUN 38 (*)    Total Protein 6.0 (*)    Albumin  3.4 (*)    AST 45 (*)    ALT 49 (*)    All other components within normal limits  PROTIME-INR - Abnormal; Notable for the following components:   Prothrombin Time 15.4 (*)    All other components within  normal limits  LIPASE, BLOOD - Abnormal; Notable for the following components:   Lipase 54 (*)    All other components within normal limits  RESP PANEL BY RT-PCR (RSV, FLU A&B, COVID)  [id number]  TYPE AND SCREEN    EKG: EKG Interpretation Date/Time:  Friday November 11 2023 02:12:01 EDT Ventricular Rate:  87 PR Interval:    QRS Duration:  81 QT Interval:  428 QTC Calculation: 515 R Axis:   67  Text Interpretation: Atrial fibrillation Nonspecific T abnormalities, lateral leads Prolonged QT interval When compared with ECG of 07/07/2020, Premature ventricular complexes are no longer present Confirmed by Raford Lenis ([id number]) on 11/11/2023 2:13:38 AM  Radiology: CT ANGIO GI BLEED Addendum Date: 11/11/2023 ADDENDUM REPORT: 11/11/2023 05:28 ADDENDUM: #2 was discussed by telephone with PA [facility] Chari Parmenter on 11/11/2023 at 05:28. At 0521 hours. Electronically Signed   By: VEAR Hurst M.D.   On: 11/11/2023 05:28   Result Date: 11/11/2023 CLINICAL DATA:  89 year old male with coffee-ground emesis. Chronic intrathoracic stomach. EXAM: CTA ABDOMEN AND PELVIS WITHOUT AND WITH CONTRAST TECHNIQUE: Multidetector CT imaging of the abdomen and pelvis was performed using the standard protocol during bolus administration of intravenous contrast. Multiplanar reconstructed images and MIPs were obtained and reviewed to evaluate the vascular anatomy. RADIATION DOSE REDUCTION: This exam was performed according to the departmental dose-optimization program which includes automated exposure control, adjustment of the mA and/or kV according to patient size and/or use of iterative reconstruction  technique. CONTRAST:  OMNIPAQUE  IOHEXOL  350 MG/ML SOLN COMPARISON:  CT chest today reported separately. CT Abdomen and Pelvis 07/28/2021. FINDINGS: VASCULAR Extensive Aortoiliac calcified atherosclerosis. Normal caliber abdominal aorta. Major arterial structures in the abdomen and pelvis remain patent despite the atherosclerosis. On delayed phase the portal venous system appears to remain patent. There is some mass effect on the splenic vein from the gastric abnormality, but splenic vein remains patent. No active contrast extravasation into bowel lumen is identified on this exam. Review of the MIP images confirms the above findings. NON-VASCULAR Lower chest: Detailed separately today, with dilated and obstructed stomach secondary to a gastric volvulus redemonstrated. Pre contrast, arterial and portal venous phase postcontrast images are provided. The entire intrathoracic portion of the dilated stomach is not included on these images. But no contrast extravasation into the stomach visible stomach is identified. See additional details on chest CT today. Hepatobiliary: Chronic gallstones. No acute liver or gallbladder finding. Pancreas: Architectural distortion from the gastric abnormality, otherwise negative. Spleen: Spleen is stable and within normal limits for age, with mild post granulomatous calcifications and mildly heterogeneous enhancement. No splenomegaly. Adrenals/Urinary Tract: Negative adrenal glands. Negative right kidney. Chronic left renal double-J ureteral stent, appears stable since 2023. Chronic urinary bladder wall thickening. Stomach/Bowel: Due to gastric volvulus as detailed separately on chest CT today. The compressed gastric outlet and proximal duodenum is redemonstrated on series 18, image 20. From there the duodenum is within normal limits. Nondilated jejunum. Nondilated distal small bowel. Redundant large bowel with diverticulosis and retained stool. But most large bowel loops are  decompressed, including the splenic flexure and the transverse colon. Furthermore, the mid transverse colon also appears to be herniated and mildly twisted above the diaphragm in conjunction with the gastric hernia and volvulus. See coronal images 92 (proximal) and 98 (distal). But the upstream right colon is nondilated. No pneumoperitoneum.  No free fluid in the abdomen. Moderate size stool ball in the rectum, new since 2023. Lymphatic: No lymphadenopathy identified. Reproductive: Negative. Other: Chronic left  pelvic sidewall and presacral stranding is stable since 2023. no acute pelvic free fluid. Musculoskeletal: Chronic hyperostosis related thoracic spinal ankylosis. Chronic L5 compression fracture is stable since 2023. Superimposed degenerative and/or posttraumatic lower lumbar facet ankylosis. No acute osseous abnormality identified. IMPRESSION: 1. Acute Gastric Outlet Obstruction secondary to Gastric Volvulus in the setting of chronic large hiatal hernia and intrathoracic stomach. See Chest CT today for additional details of the stomach. 2. NOTE ALSO the midline segment of the Transverse Colon also appears to be herniated and twisted in conjunction with #1. However, there are no dilated or obstructed bowel loops at this time distal to the stomach. No pneumoperitoneum or free fluid. 3. No active gastrointestinal bleeding identified by CTA. 4. No other acute finding in the abdomen or pelvis. Advanced large bowel diverticulosis. Advanced Aortic Atherosclerosis (ICD10-I70.0). Chronic left renal double-J ureteral stent. Chronic L5 compression fracture. Electronically Signed: By: VEAR Hurst M.D. On: 11/11/2023 05:02   CT Chest W Contrast Addendum Date: 11/11/2023 ADDENDUM REPORT: 11/11/2023 05:08 ADDENDUM: Study discussed by telephone with PA [facility] Teaster on 11/11/2023 at 0452 hours. Electronically Signed   By: VEAR Hurst M.D.   On: 11/11/2023 05:08   Result Date: 11/11/2023 CLINICAL DATA:  89 year old male  with coffee-ground emesis. Chronic intrathoracic stomach. EXAM: CT CHEST WITH CONTRAST TECHNIQUE: Multidetector CT imaging of the chest was performed during intravenous contrast administration. RADIATION DOSE REDUCTION: This exam was performed according to the departmental dose-optimization program which includes automated exposure control, adjustment of the mA and/or kV according to patient size and/or use of iterative reconstruction technique. CONTRAST:  OMNIPAQUE  IOHEXOL  350 MG/ML SOLN COMPARISON:  Prior CT Abdomen and Pelvis 07/28/2021. Prior chest CT 03/12/2013. CTA Abdomen and Pelvis today reported separately. FINDINGS: Cardiovascular: Chronic sternotomy, CABG. Chronic atherosclerosis and tortuosity of the thoracic aorta, stable since 2015. Mild cardiomegaly appears increased. No pericardial effusion. Central pulmonary arteries grossly patent. Mediastinum/Nodes: Abnormally dilated gas and fluid containing esophagus which tapers to the gastroesophageal junction. Chronic intrathoracic stomach. New since 07/28/2021 the stomach is diffusely dilated, fluid-filled, and twisted such that a gastric volvulus is present. Previously the gastric antrum was intrathoracic. Now up portion of dilated gas and fluid containing stomach is subdiaphragmatic, and the gastric outlet is severely compressed as seen on series 2, images 54 through 62 and coronal images 54 through 65. There is a small volume of edema or free fluid in the posterior mesentery. No superimposed mediastinal mass or lymphadenopathy. There are small chronic post granulomatous calcified mediastinal and hilar lymph nodes. Lungs/Pleura: Chronic left lung base architectural distortion from intrathoracic stomach. Central airways remain patent but there is left lower lobe consolidation now in the setting of gastric distension. Additional sub solid peribronchovascular nodularity in the right upper lobe (series 4, images 48 through 62). Contralateral left upper  lobe mostly dependent ground-glass opacity more resembles atelectasis. No pneumothorax. Small volume mildly complex layering fluid in the left hemithorax could be in the posterior mediastinum or the pleural space on series 2, image 33. There is no drainable pleural effusion. Upper Abdomen: Dilated gas and fluid containing distal stomach subjacent to the diaphragm now with twisted and compressed gastric outlet (series 2, image 54). No pneumoperitoneum is identified. There is a small volume of left quadrant ascites. Otherwise see CTA Abdomen and Pelvis reported separately. Musculoskeletal: Chronically exaggerated thoracic kyphosis with hyperostosis related widespread thoracic spinal ankylosis. This is similar to, mildly progressed from the 2015 CT. Chronic sternotomy. No acute or suspicious osseous lesion identified. IMPRESSION: 1.  Positive for Acute Gastric Volvulus in the setting of chronic intrathoracic stomach from large chronic hiatal hernia. Deedra this is an organo-axial volvulus. Dilated air and fluid containing stomach and esophagus which - rather than fully intrathoracic as seen in 2023 - distal stomach now is subdiaphragmatic, with twisted, compressed and obstructed gastric outlet (series 2, image 54). 2. Small volume of free fluid in the posterior mediastinum and/or left hemithorax. No pneumomediastinum. No pneumoperitoneum in the visible upper abdomen (see also CTA Abdomen and Pelvis today reported separately.). 3. Associated early bilateral lung infection with right upper lobe dependent bronchopneumonia and left lower lobe atelectasis versus consolidation. Electronically Signed: By: VEAR Hurst M.D. On: 11/11/2023 04:47     .Critical Care  Performed by: Bobette Pleasant SAUNDERS, PA-C Authorized by: Bobette Pleasant SAUNDERS, PA-C   Critical care provider statement:    Critical care time (minutes):  45   Critical care was time spent personally by me on the following activities:  Development of treatment  plan with patient or surrogate, discussions with consultants, evaluation of patient's response to treatment, examination of patient, obtaining history from patient or surrogate, ordering and performing treatments and interventions, ordering and review of laboratory studies, ordering and review of radiographic studies, pulse oximetry and re-evaluation of patient's condition    Medications Ordered in the ED  ondansetron  (ZOFRAN ) injection 4 mg (4 mg Intravenous Given 11/11/23 0225)  pantoprazole  (PROTONIX ) injection 40 mg (40 mg Intravenous Given 11/11/23 0330)  iohexol  (OMNIPAQUE ) 350 MG/ML injection 100 mL (100 mLs Intravenous Contrast Given 11/11/23 0413)  LORazepam  (ATIVAN ) injection 1 mg (1 mg Intravenous Given 11/11/23 0342)  Ampicillin -Sulbactam (UNASYN ) 3 g in sodium chloride  0.9 % 100 mL IVPB (0 g Intravenous Stopped 11/11/23 0624)    Clinical Course as of 11/11/23 0702  Fri Nov 11, 2023  0451 Critical result from radiologist Dr. Hurst.  Patient has nearly completely intrathoracic stomach with gastric volvulus with complete gastric outlet obstruction and significant dilation of the stomach.  Additionally, medial aspect of the transverse colon also herniated into the hiatal hernia No evidence of acute hemorrhage on CTA. I appreciate his collaboration in the care of this patient.  Consult to surgery pending. [RS]  0600 Consult to Dr. Ebbie, surgery, who will see the patient today. Recommends GI consult as well. I appreciate his collaboration in the care of this patient.  [RS]  [phone] Consult call received from Dr. Rollin, GI who will see the patient in the ED this morning. Consult to hospitalist Dr. Franky who is amenable to advance patient to surge.  I appreciate their collaboration in the care of this patient. [RS]    Clinical Course User Index [RS] Kashauna Celmer, Pleasant SAUNDERS, PA-C                                 Medical Decision Making 89 year old male presents with concern for vomiting.   Mildly hypertensive on intake, vital signs otherwise reassuring.  Cardiopulmonary and abdominal exams as above with generalized mild abdominal tenderness palpation without rebound or guarding.  Patient at mental status baseline.  DDx is broad, spanning from dissection to viral URI.   Amount and/or Complexity of Data Reviewed Labs: ordered.    Details: CBC with anemia, hemoglobin 9.7 near baseline.  CMP with mildly elevated sodium 146, AST/ALT elevated to 45/49.  Lipase is 54.  INR is normal, RVP is negative. Radiology: ordered.    Details: CT with acute  gastric volvulus in the setting of intrathoracic stomach from a large, hiatal hernia, midline segment of the transverse colon also contained within the hernia.  No active GI bleeding on CTA.  Risk Prescription drug management. Decision regarding hospitalization.   Consults to general surgery and gastroenterology as above who will see the patient in the ED this morning.  Patient admitted to hospital medicine.  This chart was dictated using voice recognition software, Dragon. Despite the best efforts of this provider to proofread and correct errors, errors may still occur which can change documentation meaning.       Final diagnoses:  Gastric volvulus    ED Discharge Orders     None          Bobette Pleasant JONELLE DEVONNA 11/11/23 [id number]    Raford Lenis, MD 11/11/23 [phone]

## 2023-11-12 ENCOUNTER — Encounter (HOSPITAL_COMMUNITY): Payer: Self-pay | Admitting: *Deleted

## 2023-11-12 ENCOUNTER — Inpatient Hospital Stay (HOSPITAL_COMMUNITY): Payer: Self-pay | Admitting: Anesthesiology

## 2023-11-12 ENCOUNTER — Encounter (HOSPITAL_COMMUNITY)
Admission: EM | Disposition: A | Discharge status: Hospice | Payer: Self-pay | Source: Home / Self Care | Attending: Internal Medicine

## 2023-11-12 DIAGNOSIS — K440 Diaphragmatic hernia with obstruction, without gangrene: Secondary | ICD-10-CM

## 2023-11-12 DIAGNOSIS — I110 Hypertensive heart disease with heart failure: Secondary | ICD-10-CM

## 2023-11-12 DIAGNOSIS — I2510 Atherosclerotic heart disease of native coronary artery without angina pectoris: Secondary | ICD-10-CM

## 2023-11-12 DIAGNOSIS — I5032 Chronic diastolic (congestive) heart failure: Secondary | ICD-10-CM

## 2023-11-12 DIAGNOSIS — K209 Esophagitis, unspecified without bleeding: Secondary | ICD-10-CM

## 2023-11-12 HISTORY — PX: ESOPHAGOGASTRODUODENOSCOPY: SHX5428

## 2023-11-12 LAB — COMPREHENSIVE METABOLIC PANEL WITH GFR
ALT: 29 U/L (ref 0–44)
AST: 22 U/L (ref 15–41)
Albumin: 2.8 g/dL — ABNORMAL LOW (ref 3.5–5.0)
Alkaline Phosphatase: 44 U/L (ref 38–126)
Anion gap: 14 (ref 5–15)
BUN: 78 mg/dL — ABNORMAL HIGH (ref 8–23)
CO2: 24 mmol/L (ref 22–32)
Calcium: 8.6 mg/dL — ABNORMAL LOW (ref 8.9–10.3)
Chloride: 110 mmol/L (ref 98–111)
Creatinine, Ser: 1.14 mg/dL (ref 0.61–1.24)
GFR, Estimated: 60 mL/min — ABNORMAL LOW (ref >60)
Glucose, Bld: 145 mg/dL — ABNORMAL HIGH (ref 70–99)
Potassium: 4 mmol/L (ref 3.5–5.1)
Sodium: 148 mmol/L — ABNORMAL HIGH (ref 135–145)
Total Bilirubin: 0.3 mg/dL (ref 0.0–1.2)
Total Protein: 4.9 g/dL — ABNORMAL LOW (ref 6.5–8.1)

## 2023-11-12 LAB — CBC WITH DIFFERENTIAL/PLATELET
Abs Immature Granulocytes: 0.05 K/uL (ref 0.00–0.07)
Basophils Absolute: 0 K/uL (ref 0.0–0.1)
Basophils Relative: 0 %
Eosinophils Absolute: 0 K/uL (ref 0.0–0.5)
Eosinophils Relative: 0 %
HCT: 24.1 % — ABNORMAL LOW (ref 39.0–52.0)
Hemoglobin: 7.4 g/dL — ABNORMAL LOW (ref 13.0–17.0)
Immature Granulocytes: 0 %
Lymphocytes Relative: 9 %
Lymphs Abs: 1.1 K/uL (ref 0.7–4.0)
MCH: 30.6 pg (ref 26.0–34.0)
MCHC: 30.7 g/dL (ref 30.0–36.0)
MCV: 99.6 fL (ref 80.0–100.0)
Monocytes Absolute: 0.6 K/uL (ref 0.1–1.0)
Monocytes Relative: 5 %
Neutro Abs: 9.9 K/uL — ABNORMAL HIGH (ref 1.7–7.7)
Neutrophils Relative %: 86 %
Platelets: 197 K/uL (ref 150–400)
RBC: 2.42 MIL/uL — ABNORMAL LOW (ref 4.22–5.81)
RDW: 14.3 % (ref 11.5–15.5)
WBC: 11.6 K/uL — ABNORMAL HIGH (ref 4.0–10.5)
nRBC: 0.2 % (ref 0.0–0.2)

## 2023-11-12 LAB — HEMOGLOBIN AND HEMATOCRIT, BLOOD
HCT: 23.6 % — ABNORMAL LOW (ref 39.0–52.0)
Hemoglobin: 7.1 g/dL — ABNORMAL LOW (ref 13.0–17.0)

## 2023-11-12 LAB — RENAL FUNCTION PANEL
Albumin: 2.8 g/dL — ABNORMAL LOW (ref 3.5–5.0)
Anion gap: 14 (ref 5–15)
BUN: 83 mg/dL — ABNORMAL HIGH (ref 8–23)
CO2: 25 mmol/L (ref 22–32)
Calcium: 8.6 mg/dL — ABNORMAL LOW (ref 8.9–10.3)
Chloride: 111 mmol/L (ref 98–111)
Creatinine, Ser: 1.37 mg/dL — ABNORMAL HIGH (ref 0.61–1.24)
GFR, Estimated: 48 mL/min — ABNORMAL LOW (ref >60)
Glucose, Bld: 147 mg/dL — ABNORMAL HIGH (ref 70–99)
Phosphorus: 3.7 mg/dL (ref 2.5–4.6)
Potassium: 3.9 mmol/L (ref 3.5–5.1)
Sodium: 149 mmol/L — ABNORMAL HIGH (ref 135–145)

## 2023-11-12 LAB — GLUCOSE, CAPILLARY
Glucose-Capillary: 135 mg/dL — ABNORMAL HIGH (ref 70–99)
Glucose-Capillary: 146 mg/dL — ABNORMAL HIGH (ref 70–99)

## 2023-11-12 LAB — MAGNESIUM: Magnesium: 2.1 mg/dL (ref 1.7–2.4)

## 2023-11-12 LAB — PREPARE RBC (CROSSMATCH)

## 2023-11-12 LAB — CBC
HCT: 23.5 % — ABNORMAL LOW (ref 39.0–52.0)
Hemoglobin: 6.8 g/dL — CL (ref 13.0–17.0)
MCH: 30.6 pg (ref 26.0–34.0)
MCHC: 28.9 g/dL — ABNORMAL LOW (ref 30.0–36.0)
MCV: 105.9 fL — ABNORMAL HIGH (ref 80.0–100.0)
Platelets: 202 K/uL (ref 150–400)
RBC: 2.22 MIL/uL — ABNORMAL LOW (ref 4.22–5.81)
RDW: 14.6 % (ref 11.5–15.5)
WBC: 14.4 K/uL — ABNORMAL HIGH (ref 4.0–10.5)
nRBC: 0 % (ref 0.0–0.2)

## 2023-11-12 SURGERY — EGD (ESOPHAGOGASTRODUODENOSCOPY)
Anesthesia: General

## 2023-11-12 MED ORDER — LIDOCAINE 2% (20 MG/ML) 5 ML SYRINGE
INTRAMUSCULAR | Status: DC | PRN
  Administered 2023-11-12: 40 mg via INTRAVENOUS

## 2023-11-12 MED ORDER — FENTANYL CITRATE (PF) 250 MCG/5ML IJ SOLN
INTRAMUSCULAR | Status: DC | PRN
  Administered 2023-11-12: 25 ug via INTRAVENOUS

## 2023-11-12 MED ORDER — VASOPRESSIN 20 UNIT/ML IV SOLN
INTRAVENOUS | Status: AC
  Filled 2023-11-12: qty 1

## 2023-11-12 MED ORDER — PROPOFOL 10 MG/ML IV BOLUS
INTRAVENOUS | Status: DC | PRN
  Administered 2023-11-12: 90 mg via INTRAVENOUS

## 2023-11-12 MED ORDER — ONDANSETRON HCL 4 MG/2ML IJ SOLN
INTRAMUSCULAR | Status: DC | PRN
Start: 2023-11-12 — End: 2023-11-12
  Administered 2023-11-12: 4 mg via INTRAVENOUS

## 2023-11-12 MED ORDER — VASOPRESSIN 20 UNIT/ML IV SOLN
INTRAVENOUS | Status: DC | PRN
  Administered 2023-11-12 (×2): 2 [IU] via INTRAVENOUS

## 2023-11-12 MED ORDER — FENTANYL CITRATE (PF) 100 MCG/2ML IJ SOLN
INTRAMUSCULAR | Status: AC
  Filled 2023-11-12: qty 2

## 2023-11-12 MED ORDER — DEXAMETHASONE SODIUM PHOSPHATE 10 MG/ML IJ SOLN
INTRAMUSCULAR | Status: DC | PRN
  Administered 2023-11-12: 5 mg via INTRAVENOUS

## 2023-11-12 MED ORDER — LACTATED RINGERS IV SOLN: INTRAVENOUS | Status: DC | PRN

## 2023-11-12 MED ORDER — SUCCINYLCHOLINE CHLORIDE 200 MG/10ML IV SOSY
PREFILLED_SYRINGE | INTRAVENOUS | Status: DC | PRN
  Administered 2023-11-12: 100 mg via INTRAVENOUS

## 2023-11-12 MED ORDER — MUPIROCIN 2 % EX OINT
TOPICAL_OINTMENT | Freq: Two times a day (BID) | CUTANEOUS | Status: DC
  Administered 2023-11-13 – 2023-11-14 (×2): 1 via NASAL
  Filled 2023-11-12: qty 22

## 2023-11-12 MED ORDER — PHENYLEPHRINE 80 MCG/ML (10ML) SYRINGE FOR IV PUSH (FOR BLOOD PRESSURE SUPPORT)
PREFILLED_SYRINGE | INTRAVENOUS | Status: DC | PRN
  Administered 2023-11-12: 180 ug via INTRAVENOUS
  Administered 2023-11-12: 320 ug via INTRAVENOUS

## 2023-11-12 MED ORDER — SODIUM CHLORIDE 0.9% IV SOLUTION: Freq: Once | INTRAVENOUS | Status: AC

## 2023-11-12 NOTE — Anesthesia Preprocedure Evaluation (Signed)
 Anesthesia Evaluation  Patient identified by MRN, date of birth, ID band Patient confused    Reviewed: Allergy & Precautions, NPO status , Patient's Chart, lab work & pertinent test results  Airway Mallampati: II  TM Distance: >3 FB Neck ROM: Full    Dental   Pulmonary  Intrathoracic gastric volvulus.   breath sounds clear to auscultation       Cardiovascular hypertension, Pt. on medications + CAD, + CABG and +CHF  + Valvular Problems/Murmurs  Rhythm:Regular Rate:Normal     Neuro/Psych       Dementia  Neuromuscular disease    GI/Hepatic Neg liver ROS, hiatal hernia,GERD  ,,  Endo/Other  Hypothyroidism    Renal/GU negative Renal ROS     Musculoskeletal  (+) Arthritis ,    Abdominal   Peds  Hematology  (+) Blood dyscrasia, anemia   Anesthesia Other Findings   Reproductive/Obstetrics                              Anesthesia Physical Anesthesia Plan  ASA: 4  Anesthesia Plan: General   Post-op Pain Management:    Induction: Intravenous and Rapid sequence  PONV Risk Score and Plan: 2 and Dexamethasone , Ondansetron  and Treatment may vary due to age or medical condition  Airway Management Planned: Oral ETT  Additional Equipment:   Intra-op Plan:   Post-operative Plan: Extubation in OR and Possible Post-op intubation/ventilation  Informed Consent: I have reviewed the patients History and Physical, chart, labs and discussed the procedure including the risks, benefits and alternatives for the proposed anesthesia with the patient or authorized representative who has indicated his/her understanding and acceptance.   Patient has DNR.  Discussed DNR with power of attorney and Continue DNR.   Consent reviewed with POA  Plan Discussed with:   Anesthesia Plan Comments: (Pts son request DNR be maintained with the exception of meds to support BP. No CP or defibrillation)          Anesthesia Quick Evaluation

## 2023-11-12 NOTE — Consult Note (Signed)
 Consultation Note Date: 11/12/2023   Patient Name: [name].  DOB: 03-24-1927  MRN: [id number]  Age / Sex: 89 y.o., male  PCP: Sherlynn Madden, MD Referring Physician: Rosario Leatrice FERNS, MD  Reason for Consultation: Establishing goals of care  HPI/Patient Profile: 89 y.o. male  admitted on 11/11/2023     Clinical Assessment and Goals of Care: 44 year old gentleman who lives at friend's home skilled care level, has underlying history of advanced dementia, has underlying history of chronic hiatal hernia and has been admitted with organoaxial volvulus.  Has been seen and evaluated by both GI as well as general surgery Palliative care for ongoing goals of care discussions has also been requested Chart reviewed Patient seen and examined Discussed with nursing colleagues Call placed and discussed with son [name] [phone]. At present, patient does not have NG tube, not having nausea or vomiting.   NEXT OF KIN Spouse, son  SUMMARY OF RECOMMENDATIONS   Goals of care discussions: Patient has a DO NOT RESUSCITATE on the chart this has been reviewed Patient remains admitted to hospital medicine service with general surgery and gastroenterology following closely for chronic hiatal hernia now with acute organoaxial volvulus.  At present, the approach appears to be for gastroenterology to endoscopically attempt a decompression.  A surgical approach is being deemed too risky due to his medical problems as well as advanced age/advanced dementia. Call placed and explained all of this to the patient's son to the best of my ability.  He has been given medical information from various other colleagues as well.  Introduced the role and scope of inpatient palliative services at this juncture.  Discussed that a time-limited trial of current interventions will ensue and further goals of care will need to be  outlined if decompression is not able to be achieved due to and or if diet is not able to be advanced.  Gave a brief overview of hospice level of care as well. Follow hospital course and overall disease trajectory of illness for now Palliative care to follow along Thank you for the consult.  Code Status/Advance Care Planning: DNR   Symptom Management:     Palliative Prophylaxis:  Delirium Protocol    Psycho-social/Spiritual:  Desire for further Chaplaincy support:yes Additional Recommendations: Caregiving  Support/Resources  Prognosis:  Unable to determine  Discharge Planning: To Be Determined      Primary Diagnoses: Present on Admission:  Hematemesis  CAD (coronary artery disease)  Chronic diastolic CHF (congestive heart failure) (HCC)  Dementia, senile with depression, with behavioral disturbance (HCC)  Essential hypertension  GERD  HLD (hyperlipidemia)  Hypothyroidism  PAF (paroxysmal atrial fibrillation), CHA2DS2VASc score 4  Acute gastric volvulus  Incarcerated hiatal hernia  Transaminitis  Mild protein malnutrition (HCC)  Hyperglycemia  Acute on chronic blood loss anemia  Hypernatremia   I have reviewed the medical record, interviewed the patient and family, and examined the patient. The following aspects are pertinent.  Past Medical History:  Diagnosis Date   Complication of anesthesia  serious cognisance problems since my OR in January (03/13/2013   Coronary artery disease    Diverticulosis    Hiatal hernia    Hx of echocardiogram    Echo 5/16:  Mild focal basal septal hypertrophy, EF 55%, mild AI, MV repair ok with trivial MR (mean 3 mmHg), mild LAE, mild RAE, PASP 35 mmHg   Hypertension    Hypothyroidism    Irritable bowel syndrome    PAF (paroxysmal atrial fibrillation) (HCC) 05/03/2014   Personal history of colonic polyps    tubular adenoma   Pleural effusion, bilateral 03/12/2013   Small to moderate, L>R   Prostate cancer [facility]) 1991    sees Dr. Narvis at [facility], observation only    S/P CABG x 2 02/28/2013   LIMA to LAD, SVG to OM1, EVH via right thigh   S/P Maze operation for atrial fibrillation 02/28/2013   Complete bilateral atrial lesion set using bipolar radiofrequency and cryothermy ablation with clipping of LA appendage   S/P mitral valve repair, maze procedure, and CABG x2 02/28/2013   Complex valvuloplasty including triangular resection of posterior leaflet, 30 mm Sorin Memo 3D ring annuloplasty   Social History   Socioeconomic History   Marital status: Married    Spouse name: Charolette   Number of children: 3   Years of education: Not on file   Highest education level: Not on file  Occupational History   Occupation: Retired  Tobacco Use   Smoking status: Never   Smokeless tobacco: Never  Vaping Use   Vaping status: Never Used  Substance and Sexual Activity   Alcohol use: No    Alcohol/week: 0.0 standard drinks of alcohol   Drug use: No   Sexual activity: Not Currently  Other Topics Concern   Not on file  Social History Narrative   Married retired Agricultural consultant   Lives at [facility] [facility], independent living though his wife is in a wheelchair and they help each other   1 son lives in [place]  a daughter lives in [place]    Never smoker no alcohol or drugs   Social Drivers of Corporate investment banker Strain: Low Risk  (12/29/2022)   Overall Financial Resource Strain (CARDIA)    Difficulty of Paying Living Expenses: Not hard at all  Food Insecurity: No Food Insecurity (12/29/2022)   Hunger Vital Sign    Worried About Running Out of Food in the Last Year: Never true    Ran Out of Food in the Last Year: Never true  Transportation Needs: No Transportation Needs (12/29/2022)   PRAPARE - Administrator, Civil Service (Medical): No    Lack of Transportation (Non-Medical): No  Physical Activity: Inactive (12/29/2022)   Exercise Vital Sign    Days of Exercise per  Week: 0 days    Minutes of Exercise per Session: 0 min  Stress: No Stress Concern Present (12/29/2022)   [employer] of Occupational Health - Occupational Stress Questionnaire    Feeling of Stress : Not at all  Social Connections: Moderately Isolated (12/29/2022)   Social Connection and Isolation Panel    Frequency of Communication with Friends and Family: Twice a week    Frequency of Social Gatherings with Friends and Family: Twice a week    Attends Religious Services: Never    Database administrator or Organizations: No    Attends Banker Meetings: Never    Marital Status: Married   Family History  Problem  Relation Age of Onset   Hypertension Mother    Sudden death Mother    Stroke Mother    Heart disease Mother    Hypertension Father    Sudden death Father    Stroke Father    Heart disease Father    Heart disease Son    Diabetes Son    Lung cancer Brother    Stomach cancer Brother    Cancer Daughter        rebdomyosarcoma   Kidney cancer Brother        mets   Diabetes Sister    Scheduled Meds:  bisacodyl   10 mg Rectal Daily   Chlorhexidine  Gluconate Cloth  6 each Topical Daily   mupirocin  ointment   Nasal BID   pantoprazole  (PROTONIX ) IV  40 mg Intravenous Q12H   simethicone   80 mg Oral QID   Continuous Infusions:  sodium chloride      ampicillin -sulbactam (UNASYN ) IV Stopped (11/12/23 0630)   lactated ringers      PRN Meds:.acetaminophen  **OR** acetaminophen , alum & mag hydroxide-simeth, haloperidol  lactate, lactated ringers , lip balm, magic mouthwash, menthol , methocarbamol  (ROBAXIN ) injection, metoprolol  tartrate, naphazoline-glycerin , ondansetron  (ZOFRAN ) IV, ondansetron  **OR** [DISCONTINUED] ondansetron  (ZOFRAN ) IV, mouth rinse, phenol, prochlorperazine , sodium chloride  Medications Prior to Admission:  Prior to Admission medications   Medication Sig Start Date End Date Taking? Authorizing Provider  acetaminophen  (TYLENOL ) 500 MG tablet Take 2  tablets (1,000 mg total) by mouth 2 (two) times daily as needed. 01/28/22  Yes Fargo, Amy E, NP  atorvastatin  (LIPITOR) 10 MG tablet TAKE 1 TABLET EVERY DAY AT 6PM 12/03/21  Yes Johnny Garnette LABOR, MD  dextromethorphan-guaiFENesin (ROBITUSSIN-DM) 10-100 MG/5ML liquid Take 10 mLs by mouth every 6 (six) hours as needed for cough.   Yes [provider]  divalproex  (DEPAKOTE ) 125 MG DR tablet Take 125 mg by mouth at bedtime.   Yes [provider]  escitalopram (LEXAPRO) 20 MG tablet Take 20 mg by mouth daily.   Yes [provider]  ferrous sulfate  325 (65 FE) MG EC tablet TAKE 1 TABLET BY MOUTH EVERY DAY 12/16/21  Yes Johnny Garnette LABOR, MD  fish oil-omega-3 fatty acids  1000 MG capsule Take 1 g by mouth every morning.   Yes [provider]  hydrocortisone cream 1 % Apply 1 Application topically as needed. For rash on right cheek   Yes [provider]  lactose free nutrition (BOOST) LIQD Take 237 mLs by mouth 3 (three) times daily.   Yes [provider]  levothyroxine  (SYNTHROID ) 50 MCG tablet TAKE 1 TABLET BY MOUTH IN THE MORNING 08/17/21  Yes Johnny Garnette LABOR, MD  lidocaine  (HM LIDOCAINE  PATCH) 4 % Place 1 patch onto the skin See admin instructions. Apply to left shoulder once a day, 12 hours on 12 hours off   Yes [provider]  loperamide  (IMODIUM  A-D) 2 MG tablet Take 4 mg by mouth as needed for diarrhea or loose stools (Max 16 mg in 24 hours). . If >3 loose stools in 24hrs, hold all laxatives/stool softeners. If not effective, notify MD. Do Not give if patient has fever, recent history of CDiff, on an antibiotic,recent use of a   Yes [provider]  nitroGLYCERIN  (NITROSTAT ) 0.4 MG SL tablet Place 1 tablet (0.4 mg total) under the tongue every 5 (five) minutes as needed for chest pain. 03/17/15  Yes Okey Vina GAILS, MD  Nutritional Supplements (NUTRITIONAL SHAKE PO) Take 237 mLs by mouth daily. MIGHTY SHAKES   Yes [provider]   nystatin -triamcinolone  (MYCOLOG II) cream Apply 1 Application topically 2 (two) times daily. 11/08/23  Yes [provider]  omeprazole (PRILOSEC) 20 MG capsule Take 20 mg by mouth daily. 10/31/23  Yes [provider]  QUEtiapine  (SEROQUEL ) 25 MG tablet Take 1 tablet (25 mg total) by mouth daily in the afternoon. Give at 2 pm 08/27/22  Yes Fargo, Amy E, NP  simethicone  (MYLICON) 80 MG chewable tablet Chew 80 mg by mouth every 12 (twelve) hours as needed for flatulence.   Yes [provider]  acetaminophen  (TYLENOL ) 325 MG tablet Take 650 mg by mouth every 4 (four) hours as needed.    [provider]  amoxicillin (AMOXIL) 500 MG capsule Take 1,000 mg by mouth 2 (two) times daily. 11/02/23   [provider]  LORazepam  (ATIVAN ) 2 MG tablet Take 2 mg by mouth at bedtime as needed. 11/02/23   [provider]   Allergies  Allergen Reactions   Other Other (See Comments)    ANESTHESIA.... 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 ANESTHESIA.... 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 Occurred post op CABG 2015, required readmission and rehab   Review of Systems Confused and restless  Physical Exam Confused and restless appearing Wearing mittens Mumbles a few questions appropriately Monitor noted Abdomen not distended not tender Extremities are warm and dry does not have peripheral edema  Vital Signs: BP (!) 126/104 (BP Location: Right Arm)   Pulse 88   Temp 98.9 F (37.2 C) (Axillary)   Resp 17   Ht 5' 9 (1.753 m)   Wt 59.1 kg   SpO2 100%   BMI 19.24 kg/m  Pain Scale: PAINAD   Pain Score: 0-No pain   SpO2: SpO2: 100 % O2 Device:SpO2: 100 % O2 Flow Rate: .O2 Flow Rate (L/min): 2 L/min  IO: Intake/output summary:  Intake/Output Summary (Last 24 hours) at 11/12/2023 1057 Last data filed at 11/12/2023 0739 Gross per 24 hour  Intake 3354.12 ml  Output 600 ml  Net 2754.12 ml    LBM: Last BM Date :   (pta) Baseline Weight: Weight: 59.1 kg Most recent weight: Weight: 59.1 kg     Palliative Assessment/Data:   PPS 30%  Time In:  9 Time Out:  10.20 Time Total:  80 Greater than 50%  of this time was spent counseling and coordinating care related to the above assessment and plan.  Signed by: Lonia Serve, MD   Please contact Palliative Medicine Team phone at [phone] for questions and concerns.  For individual provider: See Tracey

## 2023-11-12 NOTE — Progress Notes (Signed)
 [facility] Gastroenterology Progress Note  [name]. 89 y.o. 04-Dec-1927  CC: Gastric volvulus   Subjective: Patient seen and examined at bedside.  He is somewhat agitated.  Not able to obtain any meaningful history from the patient.  ROS : Not able to obtain   Objective: Vital signs in last 24 hours: Vitals:   11/12/23 0734 11/12/23 0800  BP: 127/74 (!) 126/104  Pulse: 90 88  Resp: 19 17  Temp: 98.9 F (37.2 C)   SpO2: 99% 100%    Physical Exam: Somewhat agitated, trying to get out of the bed.  Abdomen is mildly distended, not able to appreciate any tenderness with ongoing agitation.  Bowel sound present.  Lab Results: Recent Labs    11/12/23 0247 11/12/23 0950  NA 148* 149*  K 4.0 3.9  CL 110 111  CO2 24 25  GLUCOSE 145* 147*  BUN 78* 83*  CREATININE 1.14 1.37*  CALCIUM  8.6* 8.6*  MG  --  2.1  PHOS  --  3.7   Recent Labs    11/11/23 0217 11/12/23 0247 11/12/23 0950  AST 45* 22  --   ALT 49* 29  --   ALKPHOS 58 44  --   BILITOT 0.4 0.3  --   PROT 6.0* 4.9*  --   ALBUMIN  3.4* 2.8* 2.8*   Recent Labs    11/11/23 0217 11/11/23 0958 11/12/23 0247 11/12/23 0950  WBC 7.6  --  14.4* 11.6*  NEUTROABS 6.2  --   --  9.9*  HGB 9.7*   < > 6.8* 7.4*  HCT 31.5*   < > 23.5* 24.1*  MCV 97.8  --  105.9* 99.6  PLT 197  --  202 197   < > = values in this interval not displayed.   Recent Labs    11/11/23 0217  LABPROT 15.4*  INR 1.2      Assessment/Plan: -Gastric volvulus.  Organoaxial volvulus. - Severe dementia - Anemia  Recommendations ------------------------- - Patient was initially scheduled for EGD yesterday but was rescheduled because of need for urgent procedure to be done by Dr. Rollin yesterday.  I discussed the need for procedure with patient's son today and consent obtained over the phone.  Usually, decompression does not change underlying anatomical defect or anatomical pathology and gastric volvulus usually reoccurs.  Surgery is on  standby.  Risks (bleeding, infection, bowel perforation that could require surgery, sedation-related changes in cardiopulmonary systems), benefits (identification and possible treatment of source of symptoms, exclusion of certain causes of symptoms), and alternatives (watchful waiting, radiographic imaging studies, empiric medical treatment)  were explained to family in detail and patient wishes to proceed.    Layla Lah MD, FACP 11/12/2023, 12:53 PM  Contact #  [phone]

## 2023-11-12 NOTE — Anesthesia Procedure Notes (Addendum)
 Procedure Name: Intubation Date/Time: 11/12/2023 2:10 PM  Performed by: Claudene Hoy CROME, CRNAPre-anesthesia Checklist: Patient identified, Emergency Drugs available, Suction available and Patient being monitored Patient Re-evaluated:Patient Re-evaluated prior to induction Oxygen Delivery Method: Circle System Utilized Preoxygenation: Pre-oxygenation with 100% oxygen Induction Type: IV induction, Rapid sequence and Cricoid Pressure applied Laryngoscope Size: Glidescope and 4 Grade View: Grade I Tube type: Oral Tube size: 7.0 mm Number of attempts: 1 Airway Equipment and Method: Stylet Placement Confirmation: ETT inserted through vocal cords under direct vision, positive ETCO2 and breath sounds checked- equal and bilateral Secured at: 23 cm Tube secured with: Tape Dental Injury: Teeth and Oropharynx as per pre-operative assessment  Comments: Attempted x 1 with DL, couldn't see airway, full of brown liquids, suctioned and glide used, grade 1 view.

## 2023-11-12 NOTE — Op Note (Signed)
 [facility], [place] Patient Name: [name] Procedure Date: 11/12/2023 MRN: [id number] Attending MD: Layla Lah , MD, [id number] Date of Birth: 03-18-1927 CSN: [id number] Age: 89 Admit Type: Inpatient Procedure:                Upper GI endoscopy Indications:              Therapeutic procedure, Abnormal CT of the GI tract Providers:                Layla Lah, MD, Randall Lines, RN, Lorrayne Kitty, Technician Referring MD:              Medicines:                Sedation Administered by an Anesthesia Professional Complications:            No immediate complications. Estimated Blood Loss:     Estimated blood loss was minimal. Procedure:                Pre-Anesthesia Assessment:                           - Prior to the procedure, a History and Physical                            was performed, and patient medications and                            allergies were reviewed. The patient's tolerance of                            previous anesthesia was also reviewed. The risks                            and benefits of the procedure and the sedation                            options and risks were discussed with the patient.                            All questions were answered, and informed consent                            was obtained. Prior Anticoagulants: The patient has                            taken no anticoagulant or antiplatelet agents. ASA                            Grade Assessment: IV - A patient with severe                            systemic disease that is a constant threat to life.  After reviewing the risks and benefits, the patient                            was deemed in satisfactory condition to undergo the                            procedure.                           After obtaining informed consent, the endoscope was                            passed under direct vision. Throughout the                             procedure, the patient's blood pressure, pulse, and                            oxygen saturations were monitored continuously. The                            [id number]-H190 ([id number]) Olympus endoscope was introduced                            through the mouth, and advanced to the second part                            of duodenum. The upper GI endoscopy was technically                            difficult and complex due to abnormal anatomy. The                            patient tolerated the procedure well. Scope In: Scope Out: Findings:      The lumen of the lower third of the esophagus was moderately dilated.      Fluid was found in the lower third of the esophagus.      LA Grade C (one or more mucosal breaks continuous between tops of 2 or       more mucosal folds, less than 75% circumference) esophagitis with no       bleeding was found in the lower third of the esophagus.      Retained fluid was found in the cardia, in the gastric fundus and in the       gastric body. Fluid aspiration was performed.      Gastric volvulus was present in the gastric body. Decompression of the       volvulus was attempted and was successful, with complete decompression       achieved.      The duodenal bulb, first portion of the duodenum and second portion of       the duodenum were normal. Impression:               - Dilation in the lower third of the esophagus.                           -  Fluid in the lower third of the esophagus.                           - LA Grade C esophagitis with no bleeding.                           - Retained gastric fluid. Fluid aspiration                            performed.                           - Gastric volvulus.                           - Normal duodenal bulb, first portion of the                            duodenum and second portion of the duodenum. Moderate Sedation:      Moderate (conscious) sedation was personally administered by an        anesthesia professional. The following parameters were monitored: oxygen       saturation, heart rate, blood pressure, and response to care. Recommendation:           - Return patient to hospital ward for ongoing care.                           - NPO.                           - Continue present medications. Procedure Code(s):        --- Professional ---                           [phone], Esophagogastroduodenoscopy, flexible,                            transoral; diagnostic, including collection of                            specimen(s) by brushing or washing, when performed                            (separate procedure) Diagnosis Code(s):        --- Professional ---                           K22.89, Other specified disease of esophagus                           K20.90, Esophagitis, unspecified without bleeding                           K31.89, Other diseases of stomach and duodenum                           R93.3, Abnormal findings on diagnostic imaging of  other parts of digestive tract CPT copyright 2022 American Medical Association. All rights reserved. The codes documented in this report are preliminary and upon coder review may  be revised to meet current compliance requirements. Layla Lah, MD Layla Lah, MD 11/12/2023 2:41:51 PM Number of Addenda: 0

## 2023-11-12 NOTE — Transfer of Care (Signed)
 Immediate Anesthesia Transfer of Care Note  Patient: [name].  Procedure(s) Performed: EGD (ESOPHAGOGASTRODUODENOSCOPY)  Patient Location: PACU  Anesthesia Type:General  Level of Consciousness: drowsy  Airway & Oxygen Therapy: Patient Spontanous Breathing and Patient connected to face mask oxygen  Post-op Assessment: Report given to RN and Post -op Vital signs reviewed and stable  Post vital signs: Reviewed and stable  Last Vitals:  Vitals Value Taken Time  BP 111/54 11/12/23 14:40  Temp 36.8 C 11/12/23 14:40  Pulse    Resp 22 11/12/23 14:43  SpO2 100 % 11/12/23 14:40  Vitals shown include unfiled device data.  Last Pain:  Vitals:   11/12/23 1440  TempSrc: Temporal  PainSc:          Complications: No notable events documented.

## 2023-11-12 NOTE — Brief Op Note (Signed)
 11/12/2023  2:42 PM  PATIENT:  [name].  89 y.o. male  PRE-OPERATIVE DIAGNOSIS:  Gastric volvulus  POST-OPERATIVE DIAGNOSIS:  gastric volvulus, decompression, hiatal hernia, 2L fluids suctioned from stomach  PROCEDURE:  Procedure(s): EGD (ESOPHAGOGASTRODUODENOSCOPY) (N/A)  SURGEON:  Surgeons and Role:    * Lord Lancour, MD - Primary  Finding ---------- - EGD showed dilated lower part of esophagus with retained fluid which was suctioned.  Was found to have esophagitis -Large amount of retained liquid in the stomach with initial difficult visualization.  Around 2 L of fluid suctioned.  Gastric volvulus found in the gastric body.  I was able to advance scope through this area.  Retained fluid were also found in the gastric antrum which was suctioned.  Recommendations -------------------------- - Finding discussed with patient's son over the phone. -Recommend IV Protonix  40 mg twice a day while in the hospital for esophagitis -Further management per surgical team. -GI will sign off.  Call us  back if needed.  Layla Lah MD, FACP 11/12/2023, 2:43 PM  Contact #  [phone]

## 2023-11-12 NOTE — Anesthesia Postprocedure Evaluation (Signed)
 Anesthesia Post Note  Patient: [name].  Procedure(s) Performed: EGD (ESOPHAGOGASTRODUODENOSCOPY)     Patient location during evaluation: PACU Anesthesia Type: General Level of consciousness: awake and alert and confused Pain management: pain level controlled Vital Signs Assessment: post-procedure vital signs reviewed and stable Respiratory status: spontaneous breathing, nonlabored ventilation, respiratory function stable and patient connected to nasal cannula oxygen Cardiovascular status: blood pressure returned to baseline and stable Postop Assessment: no apparent nausea or vomiting Anesthetic complications: no   No notable events documented.  Last Vitals:  Vitals:   11/12/23 1500 11/12/23 1510  BP: 118/70 (!) 96/56  Pulse:    Resp:  19  Temp:  36.9 C  SpO2: 100% 100%    Last Pain:  Vitals:   11/12/23 1510  TempSrc: Temporal  PainSc:                  Epifanio Lamar FORBES

## 2023-11-12 NOTE — Progress Notes (Signed)
 Subjective/Chief Complaint: No overnight events.    Objective: Vital signs in last 24 hours: Temp:  [97.8 F (36.6 C)-100 F (37.8 C)] 98.9 F (37.2 C) (09/20 0734) Pulse Rate:  [81-109] 88 (09/20 0800) Resp:  [13-25] 17 (09/20 0800) BP: (98-163)/(42-118) 126/104 (09/20 0800) SpO2:  [97 %-100 %] 100 % (09/20 0800) Last BM Date :  (pta)  Intake/Output from previous day: 09/19 0701 - 09/20 0700 In: 2981.6 [I.V.:1698.3; IV Piggyback:1283.3] Out: 600 [Urine:600] Intake/Output this shift: Total I/O In: 372.5 [Blood:372.5] Out: -   General:  in bed, alert.  Answers some questions but not all answers or words make sense.  Not oriented.  In soft mitten restraints.   Resp:  CTAB CV:  RR&R Abd:  soft, NT, ND, +BS Ext:  Warm, well perfused.    Lab Results:  Recent Labs    11/11/23 0217 11/11/23 0958 11/11/23 1945 11/12/23 0247  WBC 7.6  --   --  14.4*  HGB 9.7*   < > 7.1* 6.8*  HCT 31.5*   < > 23.5* 23.5*  PLT 197  --   --  202   < > = values in this interval not displayed.   BMET Recent Labs    11/11/23 0958 11/12/23 0247  NA 148* 148*  K 4.2 4.0  CL 106 110  CO2 27 24  GLUCOSE 155* 145*  BUN 41* 78*  CREATININE 0.99 1.14  CALCIUM  9.3 8.6*   PT/INR Recent Labs    11/11/23 0217  LABPROT 15.4*  INR 1.2   ABG No results for input(s): PHART, HCO3 in the last 72 hours.  Invalid input(s): PCO2, PO2  Studies/Results: CT ANGIO GI BLEED Addendum Date: 11/11/2023 ADDENDUM REPORT: 11/11/2023 05:28 ADDENDUM: #2 was discussed by telephone with PA [facility] SPONSELLER on 11/11/2023 at 05:28. At 0521 hours. Electronically Signed   By: VEAR Hurst M.D.   On: 11/11/2023 05:28   Result Date: 11/11/2023 CLINICAL DATA:  89 year old male with coffee-ground emesis. Chronic intrathoracic stomach. EXAM: CTA ABDOMEN AND PELVIS WITHOUT AND WITH CONTRAST TECHNIQUE: Multidetector CT imaging of the abdomen and pelvis was performed using the standard protocol during bolus  administration of intravenous contrast. Multiplanar reconstructed images and MIPs were obtained and reviewed to evaluate the vascular anatomy. RADIATION DOSE REDUCTION: This exam was performed according to the departmental dose-optimization program which includes automated exposure control, adjustment of the mA and/or kV according to patient size and/or use of iterative reconstruction technique. CONTRAST:  OMNIPAQUE  IOHEXOL  350 MG/ML SOLN COMPARISON:  CT chest today reported separately. CT Abdomen and Pelvis 07/28/2021. FINDINGS: VASCULAR Extensive Aortoiliac calcified atherosclerosis. Normal caliber abdominal aorta. Major arterial structures in the abdomen and pelvis remain patent despite the atherosclerosis. On delayed phase the portal venous system appears to remain patent. There is some mass effect on the splenic vein from the gastric abnormality, but splenic vein remains patent. No active contrast extravasation into bowel lumen is identified on this exam. Review of the MIP images confirms the above findings. NON-VASCULAR Lower chest: Detailed separately today, with dilated and obstructed stomach secondary to a gastric volvulus redemonstrated. Pre contrast, arterial and portal venous phase postcontrast images are provided. The entire intrathoracic portion of the dilated stomach is not included on these images. But no contrast extravasation into the stomach visible stomach is identified. See additional details on chest CT today. Hepatobiliary: Chronic gallstones. No acute liver or gallbladder finding. Pancreas: Architectural distortion from the gastric abnormality, otherwise negative. Spleen: Spleen is stable and  within normal limits for age, with mild post granulomatous calcifications and mildly heterogeneous enhancement. No splenomegaly. Adrenals/Urinary Tract: Negative adrenal glands. Negative right kidney. Chronic left renal double-J ureteral stent, appears stable since 2023. Chronic urinary bladder  wall thickening. Stomach/Bowel: Due to gastric volvulus as detailed separately on chest CT today. The compressed gastric outlet and proximal duodenum is redemonstrated on series 18, image 20. From there the duodenum is within normal limits. Nondilated jejunum. Nondilated distal small bowel. Redundant large bowel with diverticulosis and retained stool. But most large bowel loops are decompressed, including the splenic flexure and the transverse colon. Furthermore, the mid transverse colon also appears to be herniated and mildly twisted above the diaphragm in conjunction with the gastric hernia and volvulus. See coronal images 92 (proximal) and 98 (distal). But the upstream right colon is nondilated. No pneumoperitoneum.  No free fluid in the abdomen. Moderate size stool ball in the rectum, new since 2023. Lymphatic: No lymphadenopathy identified. Reproductive: Negative. Other: Chronic left pelvic sidewall and presacral stranding is stable since 2023. no acute pelvic free fluid. Musculoskeletal: Chronic hyperostosis related thoracic spinal ankylosis. Chronic L5 compression fracture is stable since 2023. Superimposed degenerative and/or posttraumatic lower lumbar facet ankylosis. No acute osseous abnormality identified. IMPRESSION: 1. Acute Gastric Outlet Obstruction secondary to Gastric Volvulus in the setting of chronic large hiatal hernia and intrathoracic stomach. See Chest CT today for additional details of the stomach. 2. NOTE ALSO the midline segment of the Transverse Colon also appears to be herniated and twisted in conjunction with #1. However, there are no dilated or obstructed bowel loops at this time distal to the stomach. No pneumoperitoneum or free fluid. 3. No active gastrointestinal bleeding identified by CTA. 4. No other acute finding in the abdomen or pelvis. Advanced large bowel diverticulosis. Advanced Aortic Atherosclerosis (ICD10-I70.0). Chronic left renal double-J ureteral stent. Chronic L5  compression fracture. Electronically Signed: By: VEAR Hurst M.D. On: 11/11/2023 05:02   CT Chest W Contrast Addendum Date: 11/11/2023 ADDENDUM REPORT: 11/11/2023 05:08 ADDENDUM: Study discussed by telephone with PA [facility] SPONSELLER on 11/11/2023 at 0452 hours. Electronically Signed   By: VEAR Hurst M.D.   On: 11/11/2023 05:08   Result Date: 11/11/2023 CLINICAL DATA:  89 year old male with coffee-ground emesis. Chronic intrathoracic stomach. EXAM: CT CHEST WITH CONTRAST TECHNIQUE: Multidetector CT imaging of the chest was performed during intravenous contrast administration. RADIATION DOSE REDUCTION: This exam was performed according to the departmental dose-optimization program which includes automated exposure control, adjustment of the mA and/or kV according to patient size and/or use of iterative reconstruction technique. CONTRAST:  OMNIPAQUE  IOHEXOL  350 MG/ML SOLN COMPARISON:  Prior CT Abdomen and Pelvis 07/28/2021. Prior chest CT 03/12/2013. CTA Abdomen and Pelvis today reported separately. FINDINGS: Cardiovascular: Chronic sternotomy, CABG. Chronic atherosclerosis and tortuosity of the thoracic aorta, stable since 2015. Mild cardiomegaly appears increased. No pericardial effusion. Central pulmonary arteries grossly patent. Mediastinum/Nodes: Abnormally dilated gas and fluid containing esophagus which tapers to the gastroesophageal junction. Chronic intrathoracic stomach. New since 07/28/2021 the stomach is diffusely dilated, fluid-filled, and twisted such that a gastric volvulus is present. Previously the gastric antrum was intrathoracic. Now up portion of dilated gas and fluid containing stomach is subdiaphragmatic, and the gastric outlet is severely compressed as seen on series 2, images 54 through 62 and coronal images 54 through 65. There is a small volume of edema or free fluid in the posterior mesentery. No superimposed mediastinal mass or lymphadenopathy. There are small chronic post granulomatous  calcified mediastinal  and hilar lymph nodes. Lungs/Pleura: Chronic left lung base architectural distortion from intrathoracic stomach. Central airways remain patent but there is left lower lobe consolidation now in the setting of gastric distension. Additional sub solid peribronchovascular nodularity in the right upper lobe (series 4, images 48 through 62). Contralateral left upper lobe mostly dependent ground-glass opacity more resembles atelectasis. No pneumothorax. Small volume mildly complex layering fluid in the left hemithorax could be in the posterior mediastinum or the pleural space on series 2, image 33. There is no drainable pleural effusion. Upper Abdomen: Dilated gas and fluid containing distal stomach subjacent to the diaphragm now with twisted and compressed gastric outlet (series 2, image 54). No pneumoperitoneum is identified. There is a small volume of left quadrant ascites. Otherwise see CTA Abdomen and Pelvis reported separately. Musculoskeletal: Chronically exaggerated thoracic kyphosis with hyperostosis related widespread thoracic spinal ankylosis. This is similar to, mildly progressed from the 2015 CT. Chronic sternotomy. No acute or suspicious osseous lesion identified. IMPRESSION: 1. Positive for Acute Gastric Volvulus in the setting of chronic intrathoracic stomach from large chronic hiatal hernia. Deedra this is an organo-axial volvulus. Dilated air and fluid containing stomach and esophagus which - rather than fully intrathoracic as seen in 2023 - distal stomach now is subdiaphragmatic, with twisted, compressed and obstructed gastric outlet (series 2, image 54). 2. Small volume of free fluid in the posterior mediastinum and/or left hemithorax. No pneumomediastinum. No pneumoperitoneum in the visible upper abdomen (see also CTA Abdomen and Pelvis today reported separately.). 3. Associated early bilateral lung infection with right upper lobe dependent bronchopneumonia and left lower lobe  atelectasis versus consolidation. Electronically Signed: By: VEAR Hurst M.D. On: 11/11/2023 04:47    Anti-infectives: Anti-infectives (From admission, onward)    Start     Dose/Rate Route Frequency Ordered Stop   11/11/23 1200  Ampicillin -Sulbactam (UNASYN ) 3 g in sodium chloride  0.9 % 100 mL IVPB        3 g 200 mL/hr over 30 Minutes Intravenous Every 6 hours 11/11/23 0808     11/11/23 0530  Ampicillin -Sulbactam (UNASYN ) 3 g in sodium chloride  0.9 % 100 mL IVPB        3 g 200 mL/hr over 30 Minutes Intravenous  Once 11/11/23 0527 11/11/23 9375       Assessment/Plan: Chronic hiatal hernia with organoaxial volvulus  EGD planned but this appears to be waiting on his son to arrive from [place] .   Currently doing OK without NGT.    Given age and dementia operative repair would be extremely high risk for him.   Surgery will follow.   No urgent need for surgical intervention     LOS: 1 day    Jina Nephew 11/12/2023

## 2023-11-12 NOTE — Progress Notes (Addendum)
 PROGRESS NOTE    [name].  FMW:[id number] DOB: 02-10-1928 DOA: 11/11/2023 PCP: Sherlynn Madden, MD  Outpatient Specialists:     Brief Narrative:  As per H&P done on admission: [name]. is a 89 y.o. male with medical history significant of frequent falls, CAD, CABG, chronic diastolic heart failure, paroxysmal atrial fibrillation, s/p maze operation for atrial fibrillation, mitral valve disease, status post MVR, presyncope, dementia, osteoarthritis, bilateral pleural effusion, prostate cancer, diverticulosis, hematuria, history of GI bleed, vitamin B12 deficiency who was brought from his nursing facility due to coffee-ground emesis x 4 that started 2 hours prior to arrival to the emergency department.  He is unable to provide further information at this time given dementia and sedation.   ED course: Initial vital signs were temperature 98.2 F, pulse 86, respiration 19, BP 146/74 mmHg O2 sat 98% on room air.  The patient was subsequently placed on nasal cannula oxygen.  He had an episode of the saturation of 88%.  He received 3 g of Unasyn , lorazepam  1 mg IVP, ondansetron  4 mg IVP and pantoprazole  40 mg IVP.   Lab work: CBC showed a white count of 5.6, normal 9.7 g/dL and platelets 802.  PT 15.4 and INR 1.2.  Lipase mildly elevated at 54 units/L.  Coronavirus, influenza and RSV PCR test was negative.  CMP showed a corrected sodium of 146 mmol/L, the rest of the electrolytes were normal.  Glucose 179, BUN 38 and creatinine 0.92 mg/dL.  Total protein 6.0 and albumin  3.4 g/dL.  AST 45 ALT 49 units/L.  Normal alkaline phosphatase and total bilirubin.   Imaging: CTA GI bleed with acute gastric outlet obstruction secondary to gastric volvulus due to a chronic large hiatal hernia and intrathoracic stomach.  The midline segment of the transverse colon also appears to be herniated and twisted in conjunction with #1.  No active gastrointestinal bleeding.  Advanced colonic  diverticulosis.  Advanced aortic atherosclerosis.  CT chest with contrast showing small volume of free fluid in the posterior mediastinum and/or left hemithorax.  No pneumomediastinum.  No pneumoperitoneum in the visible upper abdomen associated air leak bilateral lung infection with right upper lobe dependent bronchopneumonia and left lower lobe atelectasis versus consolidation.  11/12/2023: Patient was seen alongside patient's nurse.  Patient has undergone EGD with decompression of the gastric volvulus and aspiration of the gastric and lower esophageal fluids.  No significant history from the patient.     Assessment & Plan:   Principal Problem:   Incarcerated hiatal hernia Active Problems:   Hypothyroidism   HLD (hyperlipidemia)   Essential hypertension   GERD   CAD (coronary artery disease)   Chronic diastolic CHF (congestive heart failure) (HCC)   PAF (paroxysmal atrial fibrillation), CHA2DS2VASc score 4   Dementia, senile with depression, with behavioral disturbance (HCC)   Hematemesis   Acute gastric volvulus   Transaminitis   Mild protein malnutrition (HCC)   Hyperglycemia   Acute on chronic blood loss anemia   Hypernatremia   Gastric volvulus:  -Admitted with coffee-ground emesis. - Imaging studies revealed gastric volvulus.  - GI and surgery team consulted to assist with patient's management. - Patient has undergone EGD.  Volvulus has been decompressed.  Fluids were drained. - Risk for aspiration noted. - N.p.o. except for medications. - Patient has advanced dementia. - Goal of care will need to be addressed.   -Continue IV fluids. -Continue pantoprazole  40 mg every 12 hours. -Monitor hematocrit and hemoglobin. -Transfuse packed red blood  cells as needed.  Acute on chronic blood loss anemia/coffee-ground emesis -Last hemoglobin of 7.4 g/dL (down from 9.7 g/dL, however, patient was dehydrated on presentation). - Monitor H/H.      GERD Continue PPI as above.      Hypernatremia/dehydration: -Nausea vomiting/coffee-ground emesis on presentation. - Gastric volvulus as above. - Hydrate patient. - Monitor renal function and electrolytes.       Chronic diastolic CHF (congestive heart failure) (HCC) -Seems clinically compensated.     PAF (paroxysmal atrial fibrillation),  CHA2DS2VASc score 5 Age, CHF, HTN, CAD. Not on anticoagulation. Heart rate is controlled.     CAD (coronary artery disease) -Stable.     Hypothyroidism Resume levothyroxine       HLD (hyperlipidemia) Resume atorvastatin .       Essential hypertension -Continue to monitor and optimize.       Dementia, senile with depression,    with behavioral disturbance (HCC) -No behavioral problems noted currently.     Transaminitis -Resolved.  Moderate protein malnutrition (HCC) -Calorie count. - Consider dietary team consult.     Acute kidney injury: -Likely secondary to volume depletion. - Continue IV fluids. - Monitor renal function and electrolytes.       DVT prophylaxis:  Code Status: DO NOT RESUSCITATE-limited Family Communication:  Disposition Plan: Patient remains inpatient appropriate.   Consultants:  GI. Surgery  Procedures:  EGD revealed: - Dilation in the lower third of the esophagus.                           - Fluid in the lower third of the esophagus.                           - LA Grade C esophagitis with no bleeding.                           - Retained gastric fluid. Fluid aspiration                            performed.                           - Gastric volvulus.                           - Normal duodenal bulb, first portion of the                            duodenum and second portion of the duodenum.  Antimicrobials:  IV Unasyn    Subjective: No history from patient.  Objective: Vitals:   11/12/23 0600 11/12/23 0700 11/12/23 0734 11/12/23 0800  BP: 118/60 (!) 157/118 127/74 (!) 126/104  Pulse: (!) 102 93 90 88  Resp:   19 17   Temp:   98.9 F (37.2 C)   TempSrc:   Axillary   SpO2: 97% 100% 99% 100%  Weight:      Height:        Intake/Output Summary (Last 24 hours) at 11/12/2023 0856 Last data filed at 11/12/2023 0739 Gross per 24 hour  Intake 3354.12 ml  Output 600 ml  Net 2754.12 ml   Filed Weights   11/11/23 1000  Weight: 59.1 kg  Examination:  General exam: Not in any distress.  Awake.   Respiratory system: Clear to auscultation.  Cardiovascular system: S1 & S2 heard Gastrointestinal system: Abdomen is soft and nontender (examined after EGD) Central nervous system: Awake and alert.  Minimal communication.      Data Reviewed: I have personally reviewed following labs and imaging studies  CBC: Recent Labs  Lab 11/11/23 0217 11/11/23 0958 11/11/23 1945 11/12/23 0247  WBC 7.6  --   --  14.4*  NEUTROABS 6.2  --   --   --   HGB 9.7* 9.0* 7.1* 6.8*  HCT 31.5* 29.1* 23.5* 23.5*  MCV 97.8  --   --  105.9*  PLT 197  --   --  202   Basic Metabolic Panel: Recent Labs  Lab 11/11/23 0217 11/11/23 0958 11/12/23 0247  NA 146* 148* 148*  K 4.0 4.2 4.0  CL 104 106 110  CO2 29 27 24   GLUCOSE 179* 155* 145*  BUN 38* 41* 78*  CREATININE 0.92 0.99 1.14  CALCIUM  9.5 9.3 8.6*   GFR: Estimated Creatinine Clearance: 33.1 mL/min (by C-G formula based on SCr of 1.14 mg/dL). Liver Function Tests: Recent Labs  Lab 11/11/23 0217 11/12/23 0247  AST 45* 22  ALT 49* 29  ALKPHOS 58 44  BILITOT 0.4 0.3  PROT 6.0* 4.9*  ALBUMIN  3.4* 2.8*   Recent Labs  Lab 11/11/23 0217  LIPASE 54*   No results for input(s): AMMONIA in the last 168 hours. Coagulation Profile: Recent Labs  Lab 11/11/23 0217  INR 1.2   Cardiac Enzymes: No results for input(s): CKTOTAL, CKMB, CKMBINDEX, TROPONINI in the last 168 hours. BNP (last 3 results) No results for input(s): PROBNP in the last 8760 hours. HbA1C: No results for input(s): HGBA1C in the last 72 hours. CBG: Recent Labs  Lab  11/11/23 1127 11/11/23 1745 11/11/23 2344 11/12/23 0606  GLUCAP 130* 131* 142* 135*   Lipid Profile: No results for input(s): CHOL, HDL, LDLCALC, TRIG, CHOLHDL, LDLDIRECT in the last 72 hours. Thyroid  Function Tests: No results for input(s): TSH, T4TOTAL, FREET4, T3FREE, THYROIDAB in the last 72 hours. Anemia Panel: No results for input(s): VITAMINB12, FOLATE, FERRITIN, TIBC, IRON, RETICCTPCT in the last 72 hours. Urine analysis:    Component Value Date/Time   COLORURINE STRAW (A) 07/07/2020 1750   APPEARANCEUR CLEAR 07/07/2020 1750   LABSPEC 1.005 07/07/2020 1750   PHURINE 7.0 07/07/2020 1750   GLUCOSEU NEGATIVE 07/07/2020 1750   GLUCOSEU NEGATIVE 01/20/2016 1556   HGBUR LARGE (A) 07/07/2020 1750   HGBUR negative 08/02/2007 0849   BILIRUBINUR negative 12/02/2021 1321   KETONESUR 5 (A) 07/07/2020 1750   PROTEINUR Positive (A) 12/02/2021 1321   PROTEINUR NEGATIVE 07/07/2020 1750   UROBILINOGEN 0.2 12/02/2021 1321   UROBILINOGEN 0.2 01/20/2016 1556   NITRITE negative 12/02/2021 1321   NITRITE NEGATIVE 07/07/2020 1750   LEUKOCYTESUR Small (1+) (A) 12/02/2021 1321   LEUKOCYTESUR NEGATIVE 07/07/2020 1750   Sepsis Labs: @LABRCNTIP (procalcitonin:4,lacticidven:4)  ) Recent Results (from the past 240 hours)  Resp panel by RT-PCR (RSV, Flu A&B, Covid) Anterior Nasal Swab     Status: None   Collection Time: 11/11/23  2:20 AM   Specimen: Anterior Nasal Swab  Result Value Ref Range Status   SARS Coronavirus 2 by RT PCR NEGATIVE NEGATIVE Final    Comment: (NOTE) SARS-CoV-2 target nucleic acids are NOT DETECTED.  The SARS-CoV-2 RNA is generally detectable in upper respiratory specimens during the acute phase of infection. The lowest concentration  of SARS-CoV-2 viral copies this assay can detect is 138 copies/mL. A negative result does not preclude SARS-Cov-2 infection and should not be used as the sole basis for treatment or other patient  management decisions. A negative result may occur with  improper specimen collection/handling, submission of specimen other than nasopharyngeal swab, presence of viral mutation(s) within the areas targeted by this assay, and inadequate number of viral copies(<138 copies/mL). A negative result must be combined with clinical observations, patient history, and epidemiological information. The expected result is Negative.  Fact Sheet for Patients:  BloggerCourse.com  Fact Sheet for Healthcare Providers:  SeriousBroker.it  This test is no t yet approved or cleared by the [place]  FDA and  has been authorized for detection and/or diagnosis of SARS-CoV-2 by FDA under an Emergency Use Authorization (EUA). This EUA will remain  in effect (meaning this test can be used) for the duration of the COVID-19 declaration under Section 564(b)(1) of the Act, 21 U.S.C.section 360bbb-3(b)(1), unless the authorization is terminated  or revoked sooner.       Influenza A by PCR NEGATIVE NEGATIVE Final   Influenza B by PCR NEGATIVE NEGATIVE Final    Comment: (NOTE) The Xpert Xpress SARS-CoV-2/FLU/RSV plus assay is intended as an aid in the diagnosis of influenza from Nasopharyngeal swab specimens and should not be used as a sole basis for treatment. Nasal washings and aspirates are unacceptable for Xpert Xpress SARS-CoV-2/FLU/RSV testing.  Fact Sheet for Patients: BloggerCourse.com  Fact Sheet for Healthcare Providers: SeriousBroker.it  This test is not yet approved or cleared by the [place]  FDA and has been authorized for detection and/or diagnosis of SARS-CoV-2 by FDA under an Emergency Use Authorization (EUA). This EUA will remain in effect (meaning this test can be used) for the duration of the COVID-19 declaration under Section 564(b)(1) of the Act, 21 U.S.C. section 360bbb-3(b)(1),  unless the authorization is terminated or revoked.     Resp Syncytial Virus by PCR NEGATIVE NEGATIVE Final    Comment: (NOTE) Fact Sheet for Patients: BloggerCourse.com  Fact Sheet for Healthcare Providers: SeriousBroker.it  This test is not yet approved or cleared by the [place]  FDA and has been authorized for detection and/or diagnosis of SARS-CoV-2 by FDA under an Emergency Use Authorization (EUA). This EUA will remain in effect (meaning this test can be used) for the duration of the COVID-19 declaration under Section 564(b)(1) of the Act, 21 U.S.C. section 360bbb-3(b)(1), unless the authorization is terminated or revoked.  Performed at [facility], [address], [place], [place] [address]   MRSA Next Gen by PCR, Nasal     Status: Abnormal   Collection Time: 11/11/23  9:38 AM   Specimen: Nasal Mucosa; Nasal Swab  Result Value Ref Range Status   MRSA by PCR Next Gen DETECTED (A) NOT DETECTED Final    Comment: (NOTE) The GeneXpert MRSA Assay (FDA approved for NASAL specimens only), is one component of a comprehensive MRSA colonization surveillance program. It is not intended to diagnose MRSA infection nor to guide or monitor treatment for MRSA infections. Test performance is not FDA approved in patients less than 30 years old. Performed at [facility], [address], [place], [place] [address]          Radiology Studies: CT ANGIO GI BLEED Addendum Date: 11/11/2023 ADDENDUM REPORT: 11/11/2023 05:28 ADDENDUM: #2 was discussed by telephone with PA [facility] SPONSELLER on 11/11/2023 at 05:28. At 0521 hours. Electronically Signed   By: VEAR  Shona M.D.   On: 11/11/2023 05:28   Result Date: 11/11/2023 CLINICAL DATA:  89 year old male with coffee-ground emesis. Chronic intrathoracic stomach. EXAM: CTA ABDOMEN AND PELVIS WITHOUT AND WITH CONTRAST TECHNIQUE: Multidetector CT imaging of  the abdomen and pelvis was performed using the standard protocol during bolus administration of intravenous contrast. Multiplanar reconstructed images and MIPs were obtained and reviewed to evaluate the vascular anatomy. RADIATION DOSE REDUCTION: This exam was performed according to the departmental dose-optimization program which includes automated exposure control, adjustment of the mA and/or kV according to patient size and/or use of iterative reconstruction technique. CONTRAST:  100mL OMNIPAQUE  IOHEXOL  350 MG/ML SOLN COMPARISON:  CT chest today reported separately. CT Abdomen and Pelvis 07/28/2021. FINDINGS: VASCULAR Extensive Aortoiliac calcified atherosclerosis. Normal caliber abdominal aorta. Major arterial structures in the abdomen and pelvis remain patent despite the atherosclerosis. On delayed phase the portal venous system appears to remain patent. There is some mass effect on the splenic vein from the gastric abnormality, but splenic vein remains patent. No active contrast extravasation into bowel lumen is identified on this exam. Review of the MIP images confirms the above findings. NON-VASCULAR Lower chest: Detailed separately today, with dilated and obstructed stomach secondary to a gastric volvulus redemonstrated. Pre contrast, arterial and portal venous phase postcontrast images are provided. The entire intrathoracic portion of the dilated stomach is not included on these images. But no contrast extravasation into the stomach visible stomach is identified. See additional details on chest CT today. Hepatobiliary: Chronic gallstones. No acute liver or gallbladder finding. Pancreas: Architectural distortion from the gastric abnormality, otherwise negative. Spleen: Spleen is stable and within normal limits for age, with mild post granulomatous calcifications and mildly heterogeneous enhancement. No splenomegaly. Adrenals/Urinary Tract: Negative adrenal glands. Negative right kidney. Chronic left renal  double-J ureteral stent, appears stable since 2023. Chronic urinary bladder wall thickening. Stomach/Bowel: Due to gastric volvulus as detailed separately on chest CT today. The compressed gastric outlet and proximal duodenum is redemonstrated on series 18, image 20. From there the duodenum is within normal limits. Nondilated jejunum. Nondilated distal small bowel. Redundant large bowel with diverticulosis and retained stool. But most large bowel loops are decompressed, including the splenic flexure and the transverse colon. Furthermore, the mid transverse colon also appears to be herniated and mildly twisted above the diaphragm in conjunction with the gastric hernia and volvulus. See coronal images 92 (proximal) and 98 (distal). But the upstream right colon is nondilated. No pneumoperitoneum.  No free fluid in the abdomen. Moderate size stool ball in the rectum, new since 2023. Lymphatic: No lymphadenopathy identified. Reproductive: Negative. Other: Chronic left pelvic sidewall and presacral stranding is stable since 2023. no acute pelvic free fluid. Musculoskeletal: Chronic hyperostosis related thoracic spinal ankylosis. Chronic L5 compression fracture is stable since 2023. Superimposed degenerative and/or posttraumatic lower lumbar facet ankylosis. No acute osseous abnormality identified. IMPRESSION: 1. Acute Gastric Outlet Obstruction secondary to Gastric Volvulus in the setting of chronic large hiatal hernia and intrathoracic stomach. See Chest CT today for additional details of the stomach. 2. NOTE ALSO the midline segment of the Transverse Colon also appears to be herniated and twisted in conjunction with #1. However, there are no dilated or obstructed bowel loops at this time distal to the stomach. No pneumoperitoneum or free fluid. 3. No active gastrointestinal bleeding identified by CTA. 4. No other acute finding in the abdomen or pelvis. Advanced large bowel diverticulosis. Advanced Aortic Atherosclerosis  (ICD10-I70.0). Chronic left renal double-J ureteral stent. Chronic L5 compression fracture.  Electronically Signed: By: VEAR Hurst M.D. On: 11/11/2023 05:02   CT Chest W Contrast Addendum Date: 11/11/2023 ADDENDUM REPORT: 11/11/2023 05:08 ADDENDUM: Study discussed by telephone with PA [facility] SPONSELLER on 11/11/2023 at 0452 hours. Electronically Signed   By: VEAR Hurst M.D.   On: 11/11/2023 05:08   Result Date: 11/11/2023 CLINICAL DATA:  89 year old male with coffee-ground emesis. Chronic intrathoracic stomach. EXAM: CT CHEST WITH CONTRAST TECHNIQUE: Multidetector CT imaging of the chest was performed during intravenous contrast administration. RADIATION DOSE REDUCTION: This exam was performed according to the departmental dose-optimization program which includes automated exposure control, adjustment of the mA and/or kV according to patient size and/or use of iterative reconstruction technique. CONTRAST:  OMNIPAQUE  IOHEXOL  350 MG/ML SOLN COMPARISON:  Prior CT Abdomen and Pelvis 07/28/2021. Prior chest CT 03/12/2013. CTA Abdomen and Pelvis today reported separately. FINDINGS: Cardiovascular: Chronic sternotomy, CABG. Chronic atherosclerosis and tortuosity of the thoracic aorta, stable since 2015. Mild cardiomegaly appears increased. No pericardial effusion. Central pulmonary arteries grossly patent. Mediastinum/Nodes: Abnormally dilated gas and fluid containing esophagus which tapers to the gastroesophageal junction. Chronic intrathoracic stomach. New since 07/28/2021 the stomach is diffusely dilated, fluid-filled, and twisted such that a gastric volvulus is present. Previously the gastric antrum was intrathoracic. Now up portion of dilated gas and fluid containing stomach is subdiaphragmatic, and the gastric outlet is severely compressed as seen on series 2, images 54 through 62 and coronal images 54 through 65. There is a small volume of edema or free fluid in the posterior mesentery. No superimposed  mediastinal mass or lymphadenopathy. There are small chronic post granulomatous calcified mediastinal and hilar lymph nodes. Lungs/Pleura: Chronic left lung base architectural distortion from intrathoracic stomach. Central airways remain patent but there is left lower lobe consolidation now in the setting of gastric distension. Additional sub solid peribronchovascular nodularity in the right upper lobe (series 4, images 48 through 62). Contralateral left upper lobe mostly dependent ground-glass opacity more resembles atelectasis. No pneumothorax. Small volume mildly complex layering fluid in the left hemithorax could be in the posterior mediastinum or the pleural space on series 2, image 33. There is no drainable pleural effusion. Upper Abdomen: Dilated gas and fluid containing distal stomach subjacent to the diaphragm now with twisted and compressed gastric outlet (series 2, image 54). No pneumoperitoneum is identified. There is a small volume of left quadrant ascites. Otherwise see CTA Abdomen and Pelvis reported separately. Musculoskeletal: Chronically exaggerated thoracic kyphosis with hyperostosis related widespread thoracic spinal ankylosis. This is similar to, mildly progressed from the 2015 CT. Chronic sternotomy. No acute or suspicious osseous lesion identified. IMPRESSION: 1. Positive for Acute Gastric Volvulus in the setting of chronic intrathoracic stomach from large chronic hiatal hernia. Deedra this is an organo-axial volvulus. Dilated air and fluid containing stomach and esophagus which - rather than fully intrathoracic as seen in 2023 - distal stomach now is subdiaphragmatic, with twisted, compressed and obstructed gastric outlet (series 2, image 54). 2. Small volume of free fluid in the posterior mediastinum and/or left hemithorax. No pneumomediastinum. No pneumoperitoneum in the visible upper abdomen (see also CTA Abdomen and Pelvis today reported separately.). 3. Associated early bilateral lung  infection with right upper lobe dependent bronchopneumonia and left lower lobe atelectasis versus consolidation. Electronically Signed: By: VEAR Hurst M.D. On: 11/11/2023 04:47        Scheduled Meds:  bisacodyl   10 mg Rectal Daily   Chlorhexidine  Gluconate Cloth  6 each Topical Daily   pantoprazole  (PROTONIX ) IV  40  mg Intravenous Q12H   simethicone   80 mg Oral QID   Continuous Infusions:  sodium chloride      ampicillin -sulbactam (UNASYN ) IV Stopped (11/12/23 0630)   lactated ringers        LOS: 1 day    Time spent: 55 minutes.    Leatrice Chapel, MD  [facility] Pager #: [phone] 7PM-7AM contact night coverage as above

## 2023-11-13 ENCOUNTER — Other Ambulatory Visit: Payer: Self-pay

## 2023-11-13 ENCOUNTER — Encounter (HOSPITAL_COMMUNITY): Payer: Self-pay

## 2023-11-13 DIAGNOSIS — K440 Diaphragmatic hernia with obstruction, without gangrene: Secondary | ICD-10-CM | POA: Diagnosis not present

## 2023-11-13 LAB — CBC WITH DIFFERENTIAL/PLATELET
Abs Immature Granulocytes: 0.12 K/uL — ABNORMAL HIGH (ref 0.00–0.07)
Basophils Absolute: 0 K/uL (ref 0.0–0.1)
Basophils Relative: 0 %
Eosinophils Absolute: 0 K/uL (ref 0.0–0.5)
Eosinophils Relative: 0 %
HCT: 22.7 % — ABNORMAL LOW (ref 39.0–52.0)
Hemoglobin: 6.8 g/dL — CL (ref 13.0–17.0)
Immature Granulocytes: 1 %
Lymphocytes Relative: 6 %
Lymphs Abs: 0.7 K/uL (ref 0.7–4.0)
MCH: 30.9 pg (ref 26.0–34.0)
MCHC: 30 g/dL (ref 30.0–36.0)
MCV: 103.2 fL — ABNORMAL HIGH (ref 80.0–100.0)
Monocytes Absolute: 0.6 K/uL (ref 0.1–1.0)
Monocytes Relative: 5 %
Neutro Abs: 10.3 K/uL — ABNORMAL HIGH (ref 1.7–7.7)
Neutrophils Relative %: 88 %
Platelets: 194 K/uL (ref 150–400)
RBC: 2.2 MIL/uL — ABNORMAL LOW (ref 4.22–5.81)
RDW: 14.6 % (ref 11.5–15.5)
WBC: 11.7 K/uL — ABNORMAL HIGH (ref 4.0–10.5)
nRBC: 0 % (ref 0.0–0.2)

## 2023-11-13 LAB — HEMOGLOBIN AND HEMATOCRIT, BLOOD
HCT: 22.1 % — ABNORMAL LOW (ref 39.0–52.0)
HCT: 22.3 % — ABNORMAL LOW (ref 39.0–52.0)
HCT: 26.7 % — ABNORMAL LOW (ref 39.0–52.0)
Hemoglobin: 6.7 g/dL — CL (ref 13.0–17.0)
Hemoglobin: 6.8 g/dL — CL (ref 13.0–17.0)
Hemoglobin: 8.3 g/dL — ABNORMAL LOW (ref 13.0–17.0)

## 2023-11-13 LAB — PREPARE RBC (CROSSMATCH)

## 2023-11-13 LAB — GLUCOSE, CAPILLARY
Glucose-Capillary: 105 mg/dL — ABNORMAL HIGH (ref 70–99)
Glucose-Capillary: 130 mg/dL — ABNORMAL HIGH (ref 70–99)
Glucose-Capillary: 137 mg/dL — ABNORMAL HIGH (ref 70–99)

## 2023-11-13 LAB — BASIC METABOLIC PANEL WITH GFR
Anion gap: 20 — ABNORMAL HIGH (ref 5–15)
BUN: 115 mg/dL — ABNORMAL HIGH (ref 8–23)
CO2: 21 mmol/L — ABNORMAL LOW (ref 22–32)
Calcium: 8.4 mg/dL — ABNORMAL LOW (ref 8.9–10.3)
Chloride: 111 mmol/L (ref 98–111)
Creatinine, Ser: 2.23 mg/dL — ABNORMAL HIGH (ref 0.61–1.24)
GFR, Estimated: 27 mL/min — ABNORMAL LOW (ref >60)
Glucose, Bld: 125 mg/dL — ABNORMAL HIGH (ref 70–99)
Potassium: 3.8 mmol/L (ref 3.5–5.1)
Sodium: 153 mmol/L — ABNORMAL HIGH (ref 135–145)

## 2023-11-13 LAB — PHOSPHORUS: Phosphorus: 5.3 mg/dL — ABNORMAL HIGH (ref 2.5–4.6)

## 2023-11-13 LAB — MAGNESIUM: Magnesium: 2.3 mg/dL (ref 1.7–2.4)

## 2023-11-13 MED ORDER — ATORVASTATIN CALCIUM 10 MG PO TABS
10.0000 mg | ORAL_TABLET | Freq: Every day | ORAL | Status: DC
Start: 2023-11-13 — End: 2023-11-14

## 2023-11-13 MED ORDER — SODIUM CHLORIDE 0.9 % IV SOLN
3.0000 g | Freq: Two times a day (BID) | INTRAVENOUS | Status: DC
  Administered 2023-11-13 – 2023-11-14 (×2): 3 g via INTRAVENOUS
  Filled 2023-11-13: qty 8

## 2023-11-13 MED ORDER — SODIUM CHLORIDE 0.9% IV SOLUTION: Freq: Once | INTRAVENOUS | Status: AC

## 2023-11-13 MED ORDER — LEVOTHYROXINE SODIUM 25 MCG PO TABS: 50.0000 ug | ORAL_TABLET | Freq: Every morning | ORAL | Status: DC

## 2023-11-13 MED ORDER — DIVALPROEX SODIUM 125 MG PO DR TAB
125.0000 mg | DELAYED_RELEASE_TABLET | Freq: Every day | ORAL | Status: DC
  Filled 2023-11-13 (×2): qty 1

## 2023-11-13 MED ORDER — DEXTROSE 5 % IV SOLN: INTRAVENOUS | Status: AC

## 2023-11-13 MED ORDER — DEXTROSE-SODIUM CHLORIDE 5-0.45 % IV SOLN: INTRAVENOUS | Status: AC

## 2023-11-13 NOTE — Evaluation (Signed)
 Clinical/Bedside Swallow Evaluation Patient Details  Name: [name]. MRN: [id number] Date of Birth: 01-29-1928  Today's Date: 11/13/2023 Time: SLP Start Time (ACUTE ONLY): 1554 SLP Stop Time (ACUTE ONLY): 1615 SLP Time Calculation (min) (ACUTE ONLY): 21 min  Past Medical History:  Past Medical History:  Diagnosis Date   Complication of anesthesia    serious cognisance problems since my OR in January (03/13/2013   Coronary artery disease    Diverticulosis    Hiatal hernia    Hx of echocardiogram    Echo 5/16:  Mild focal basal septal hypertrophy, EF 55%, mild AI, MV repair ok with trivial MR (mean 3 mmHg), mild LAE, mild RAE, PASP 35 mmHg   Hypertension    Hypothyroidism    Irritable bowel syndrome    PAF (paroxysmal atrial fibrillation) (HCC) 05/03/2014   Personal history of colonic polyps    tubular adenoma   Pleural effusion, bilateral 03/12/2013   Small to moderate, L>R   Prostate cancer [facility] 1991   sees Dr. Narvis at [facility], observation only    S/P CABG x 2 02/28/2013   LIMA to LAD, SVG to OM1, EVH via right thigh   S/P Maze operation for atrial fibrillation 02/28/2013   Complete bilateral atrial lesion set using bipolar radiofrequency and cryothermy ablation with clipping of LA appendage   S/P mitral valve repair, maze procedure, and CABG x2 02/28/2013   Complex valvuloplasty including triangular resection of posterior leaflet, 30 mm Sorin Memo 3D ring annuloplasty   Past Surgical History:  Past Surgical History:  Procedure Laterality Date   CARDIAC CATHETERIZATION  2015   CATARACT EXTRACTION, BILATERAL Bilateral 2014   per Dr. Roz    COLONOSCOPY  2011   per Dr. Jakie, benign polyps, no repeats needed    CORONARY ARTERY BYPASS GRAFT N/A 02/28/2013   Procedure: CORONARY ARTERY BYPASS GRAFTING (CABG);  Surgeon: Sudie VEAR Laine, MD;  Location: [facility] OR;  Service: Open Heart Surgery;  Laterality: N/A;  CABG x 2,  using left internal mammary artery and right leg  saphenous vein harvested endoscopically   ESOPHAGOGASTRODUODENOSCOPY (EGD) WITH PROPOFOL  N/A 01/11/2019   Procedure: ESOPHAGOGASTRODUODENOSCOPY (EGD) WITH PROPOFOL ;  Surgeon: Legrand Victory LITTIE DOUGLAS, MD;  Location: [facility] ENDOSCOPY;  Service: Gastroenterology;  Laterality: N/A;   HEMORRHOID SURGERY     INTRAOPERATIVE TRANSESOPHAGEAL ECHOCARDIOGRAM N/A 02/28/2013   Procedure: INTRAOPERATIVE TRANSESOPHAGEAL ECHOCARDIOGRAM;  Surgeon: Sudie VEAR Laine, MD;  Location: [facility] OR;  Service: Open Heart Surgery;  Laterality: N/A;   LEFT HEART CATHETERIZATION WITH CORONARY ANGIOGRAM N/A 02/26/2013   Procedure: LEFT HEART CATHETERIZATION WITH CORONARY ANGIOGRAM;  Surgeon: Alm LELON Clay, MD;  Location: [facility] CATH LAB;  Service: Cardiovascular;  Laterality: N/A;   MAZE N/A 02/28/2013   Procedure: MAZE;  Surgeon: Sudie VEAR Laine, MD;  Location: [facility] OR;  Service: Open Heart Surgery;  Laterality: N/A;   MITRAL VALVE REPAIR N/A 02/28/2013   Procedure: MITRAL VALVE REPAIR (MVR);  Surgeon: Sudie VEAR Laine, MD;  Location: [facility] OR;  Service: Open Heart Surgery;  Laterality: N/A;   PROSTATE BIOPSY     TRANSURETHRAL RESECTION OF PROSTATE N/A 02/04/2017   Procedure: TRANSURETHRAL RESECTION OF THE PROSTATE (TURP);  Surgeon: Alvaro Hummer, MD;  Location: [facility] ORS;  Service: Urology;  Laterality: N/A;   HPI:  Patient is a 89 y.o. male with medical history significant of frequent falls, CAD, CABG, chronic diastolic heart failure, paroxysmal atrial fibrillation, s/p maze operation for atrial fibrillation, mitral valve disease, status post MVR, presyncope, dementia,  osteoarthritis, bilateral pleural effusion, prostate cancer, diverticulosis, hematuria, history of GI bleed, vitamin B12 deficiency who was brought from his nursing facility due to coffee-ground emesis X 4 that started 2 hours prior to arrival to the emergency department. He is unable to provide further information at this time given dementia and sedation. SLP order for swallow evaluation.     Assessment / Plan / Recommendation  Clinical Impression  Patient presents with clinical s/s of dysphagia as per this BSE. Patient presents with lethargy/drowsiness but was cooperative throughout the evaluation. Followed command to open mouth however, limited view. Oral mucosa very dry. Green secreations were observed during oral care. After oral care, SLP assessed patient's swallow via PO's of: ice chips and thin liquids. No overt s/s of aspiration were observed with ice chips. Patient presented with delayed swallow, throat clearing, and coughing when given cup sips of water. SLP is recommending to continue NPO diet with ice chips with full nurse supervision after oral care has been provided. SLP Visit Diagnosis: Dysphagia, unspecified (R13.10)    Aspiration Risk       Diet Recommendation NPO;Other (Comment) (ice chips after oral care with full nurse supervision)    Medication Administration: Via alternative means Supervision: Full supervision/cueing for compensatory strategies Postural Changes: Seated upright at 90 degrees    Other  Recommendations Oral Care Recommendations: Oral care BID;Oral care prior to ice chip/H20     Assistance Recommended at Discharge    Functional Status Assessment Patient has had a recent decline in their functional status and/or demonstrates limited ability to make significant improvements in function in a reasonable and predictable amount of time  Frequency and Duration min 2x/week  1 week       Prognosis Prognosis for improved oropharyngeal function: Fair Barriers to Reach Goals: Cognitive deficits Barriers/Prognosis Comment: dementia      Swallow Study   General Date of Onset: 11/13/23 HPI: Patient is a 89 y.o. male with medical history significant of frequent falls, CAD, CABG, chronic diastolic heart failure, paroxysmal atrial fibrillation, s/p maze operation for atrial fibrillation, mitral valve disease, status post MVR, presyncope, dementia,  osteoarthritis, bilateral pleural effusion, prostate cancer, diverticulosis, hematuria, history of GI bleed, vitamin B12 deficiency who was brought from his nursing facility due to coffee-ground emesis X 4 that started 2 hours prior to arrival to the emergency department. He is unable to provide further information at this time given dementia and sedation. SLP order for swallow evaluation. Type of Study: Bedside Swallow Evaluation Diet Prior to this Study: NPO Temperature Spikes Noted: No Respiratory Status: Room air History of Recent Intubation: Yes Total duration of intubation (days): 1 days ((for surgery)) Date extubated: 11/12/23 Behavior/Cognition: Alert;Lethargic/Drowsy;Cooperative Oral Cavity Assessment: Dried secretions;Dry Oral Care Completed by SLP: Yes Oral Cavity - Dentition: Adequate natural dentition Patient Positioning: Upright in bed    Oral/Motor/Sensory Function Overall Oral Motor/Sensory Function: Within functional limits   Ice Chips Ice chips: Within functional limits Presentation: Spoon   Thin Liquid Thin Liquid: Impaired Presentation: Cup;Spoon Pharyngeal  Phase Impairments: Suspected delayed Swallow;Throat Clearing - Immediate;Cough - Delayed    Nectar Thick     Honey Thick     Puree     Solid           Damien Hy  Graduate SLP Clinican

## 2023-11-13 NOTE — Progress Notes (Signed)
 PHARMACY NOTE:  ANTIMICROBIAL RENAL DOSAGE ADJUSTMENT  Current antimicrobial regimen includes a mismatch between antimicrobial dosage and estimated renal function.  As per policy approved by the Pharmacy & Therapeutics and Medical Executive Committees, the antimicrobial dosage will be adjusted accordingly.  Current antimicrobial dosage:  Unasy 3 g IV q6h  Indication: aspiration pneumonia  Renal Function:  Estimated Creatinine Clearance: 16.9 mL/min (A) (by C-G formula based on SCr of 2.23 mg/dL (H)). []      On intermittent HD, scheduled: []      On CRRT    Antimicrobial dosage has been changed to:  Unasyn  3 g IV q12h  Additional comments:   Thank you for allowing pharmacy to be a part of this patient's care.  Eleanor Agent, [facility] 11/13/2023 12:12 PM

## 2023-11-13 NOTE — Progress Notes (Signed)
 PROGRESS NOTE    [name].  FMW:[id number] DOB: 1927-11-03 DOA: 11/11/2023 PCP: Sherlynn Madden, MD  Outpatient Specialists:     Brief Narrative:  As per H&P done on admission: [name]. is a 89 y.o. male with medical history significant of frequent falls, CAD, CABG, chronic diastolic heart failure, paroxysmal atrial fibrillation, s/p maze operation for atrial fibrillation, mitral valve disease, status post MVR, presyncope, dementia, osteoarthritis, bilateral pleural effusion, prostate cancer, diverticulosis, hematuria, history of GI bleed, vitamin B12 deficiency who was brought from his nursing facility due to coffee-ground emesis x 4 that started 2 hours prior to arrival to the emergency department.  He is unable to provide further information at this time given dementia and sedation.   ED course: Initial vital signs were temperature 98.2 F, pulse 86, respiration 19, BP 146/74 mmHg O2 sat 98% on room air.  The patient was subsequently placed on nasal cannula oxygen.  He had an episode of the saturation of 88%.  He received 3 g of Unasyn , lorazepam  1 mg IVP, ondansetron  4 mg IVP and pantoprazole  40 mg IVP.   Lab work: CBC showed a white count of 5.6, normal 9.7 g/dL and platelets 802.  PT 15.4 and INR 1.2.  Lipase mildly elevated at 54 units/L.  Coronavirus, influenza and RSV PCR test was negative.  CMP showed a corrected sodium of 146 mmol/L, the rest of the electrolytes were normal.  Glucose 179, BUN 38 and creatinine 0.92 mg/dL.  Total protein 6.0 and albumin  3.4 g/dL.  AST 45 ALT 49 units/L.  Normal alkaline phosphatase and total bilirubin.   Imaging: CTA GI bleed with acute gastric outlet obstruction secondary to gastric volvulus due to a chronic large hiatal hernia and intrathoracic stomach.  The midline segment of the transverse colon also appears to be herniated and twisted in conjunction with #1.  No active gastrointestinal bleeding.  Advanced colonic  diverticulosis.  Advanced aortic atherosclerosis.  CT chest with contrast showing small volume of free fluid in the posterior mediastinum and/or left hemithorax.  No pneumomediastinum.  No pneumoperitoneum in the visible upper abdomen associated air leak bilateral lung infection with right upper lobe dependent bronchopneumonia and left lower lobe atelectasis versus consolidation.  11/12/2023: Patient was seen alongside patient's nurse.  Patient has undergone EGD with decompression of the gastric volvulus and aspiration of the gastric and lower esophageal fluids.  No significant history from the patient.    11/13/2023: Patient seen alongside patient's wife, son and daughter-in-law.  Input from neurosurgery and palliative care team is appreciated.  Patient has received 1 unit of packed red blood cells.  Posttransfusion hemoglobin is 6.8 g/dL.  Will transfuse another unit of packed red blood cells.  Renal panel done earlier today reveals sodium of 153, potassium of 3.8, chloride 111, CO2 of 21, BUN of 115, serum creatinine of 2.23 and blood sugar of 125.  Free water deficit is at least 2.74 L.  Will hydrate patient.  Will monitor renal function and electrolytes.  Hospice option is being broached.   Assessment & Plan:   Principal Problem:   Incarcerated hiatal hernia Active Problems:   Hypothyroidism   HLD (hyperlipidemia)   Essential hypertension   GERD   CAD (coronary artery disease)   Chronic diastolic CHF (congestive heart failure) (HCC)   PAF (paroxysmal atrial fibrillation), CHA2DS2VASc score 4   Dementia, senile with depression, with behavioral disturbance (HCC)   Hematemesis   Acute gastric volvulus   Transaminitis  Mild protein malnutrition (HCC)   Hyperglycemia   Acute on chronic blood loss anemia   Hypernatremia   Gastric volvulus:  -Admitted with coffee-ground emesis. - Imaging studies revealed gastric volvulus.  - GI and surgery team consulted to assist with patient's  management. - Patient has undergone EGD.  Volvulus has been decompressed.  Fluids were drained. - Risk for aspiration noted. - N.p.o. except for medications. - Patient has advanced dementia. - Goal of care will need to be addressed.   -Continue IV fluids. -Continue pantoprazole  40 mg every 12 hours. -Monitor hematocrit and hemoglobin. -Transfuse packed red blood cells as needed. 11/13/2023: Patient remains NPO.  Patient has received 1 unit of packed red blood cells, with posttransfusion hemoglobin of 6.8 g/dL.  Acute on chronic blood loss anemia/coffee-ground emesis -Last hemoglobin of 7.4 g/dL (down from 9.7 g/dL, however, patient was dehydrated on presentation). - Monitor H/H. 11/13/2023: S/p transfusion of 1 unit of packed red blood cells.  Will transfuse another unit of packed red blood cells.  Continue to monitor H/H.  Significantly elevated BUN is likely secondary to upper GI blood loss.  AKI is also noted.      GERD Continue PPI as above.     Hypernatremia/dehydration: -Nausea vomiting/coffee-ground emesis on presentation. - Gastric volvulus as above. - Hydrate patient. - Monitor renal function and electrolytes.   11/13/2023: Continue IV fluids.  2.74 L free water deficit.  D5 water 40 cc/h Low 1 L.  D5 half-normal saline at 75 cc/h.  Monitor renal function and electrolytes.     Chronic diastolic CHF (congestive heart failure) (HCC) -Seems clinically compensated.     PAF (paroxysmal atrial fibrillation),  CHA2DS2VASc score 5 Age, CHF, HTN, CAD. Not on anticoagulation. Heart rate is controlled.     CAD (coronary artery disease) -Stable.     Hypothyroidism Resume levothyroxine       HLD (hyperlipidemia) Resume atorvastatin .       Essential hypertension -Continue to monitor and optimize.       Dementia, senile with depression,    with behavioral disturbance (HCC) -No behavioral problems noted currently.     Transaminitis -Resolved.  Moderate protein  malnutrition (HCC) -Calorie count. - Consider dietary team consult.     Acute kidney injury: -Likely secondary to volume depletion. - Continue IV fluids. - Continue to monitor renal function and electrolytes.   11/13/2023: Renal panel as documented above.  Check urine sodium and urinalysis.  IV fluids.  Continue to monitor renal function and electrolytes.   Guarded prognosis.  Patient is hospice appropriate.  DVT prophylaxis:  Code Status: DO NOT RESUSCITATE-limited Family Communication:  Disposition Plan: Patient remains inpatient appropriate.   Consultants:  GI. Surgery  Procedures:  EGD revealed: - Dilation in the lower third of the esophagus.                           - Fluid in the lower third of the esophagus.                           - LA Grade C esophagitis with no bleeding.                           - Retained gastric fluid. Fluid aspiration  performed.                           - Gastric volvulus.                           - Normal duodenal bulb, first portion of the                            duodenum and second portion of the duodenum.  Antimicrobials:  IV Unasyn    Subjective: No history from patient.  Objective: Vitals:   11/13/23 1500 11/13/23 1600 11/13/23 1700 11/13/23 1814  BP: 123/80 97/62 (!) 98/59 125/83  Pulse: 96 (!) 121 (!) 145 97  Resp: 17 19 (!) 23 17  Temp:    98 F (36.7 C)  TempSrc:    Axillary  SpO2: 97% 100% 94% 94%  Weight:      Height:        Intake/Output Summary (Last 24 hours) at 11/13/2023 1825 Last data filed at 11/13/2023 1743 Gross per 24 hour  Intake 1198.01 ml  Output --  Net 1198.01 ml   Filed Weights   11/11/23 1000 11/12/23 1300  Weight: 59.1 kg 59.1 kg    Examination:  General exam: Not in any distress.  Mildly lethargic.     Respiratory system: Clear to auscultation.  Cardiovascular system: S1 & S2 heard Gastrointestinal system: Abdomen is soft and nontender (examined after  EGD) Central nervous system: Awake and alert.  Minimal communication.      Data Reviewed: I have personally reviewed following labs and imaging studies  CBC: Recent Labs  Lab 11/11/23 0217 11/11/23 0958 11/12/23 0247 11/12/23 0950 11/12/23 1834 11/13/23 0246 11/13/23 1116 11/13/23 1330  WBC 7.6  --  14.4* 11.6*  --  11.7*  --   --   NEUTROABS 6.2  --   --  9.9*  --  10.3*  --   --   HGB 9.7*   < > 6.8* 7.4* 7.1* 6.8* 6.7* 6.8*  HCT 31.5*   < > 23.5* 24.1* 23.6* 22.7* 22.1* 22.3*  MCV 97.8  --  105.9* 99.6  --  103.2*  --   --   PLT 197  --  202 197  --  194  --   --    < > = values in this interval not displayed.   Basic Metabolic Panel: Recent Labs  Lab 11/11/23 0217 11/11/23 0958 11/12/23 0247 11/12/23 0950 11/13/23 0246  NA 146* 148* 148* 149* 153*  K 4.0 4.2 4.0 3.9 3.8  CL 104 106 110 111 111  CO2 29 27 24 25  21*  GLUCOSE 179* 155* 145* 147* 125*  BUN 38* 41* 78* 83* 115*  CREATININE 0.92 0.99 1.14 1.37* 2.23*  CALCIUM  9.5 9.3 8.6* 8.6* 8.4*  MG  --   --   --  2.1 2.3  PHOS  --   --   --  3.7 5.3*   GFR: Estimated Creatinine Clearance: 16.9 mL/min (A) (by C-G formula based on SCr of 2.23 mg/dL (H)). Liver Function Tests: Recent Labs  Lab 11/11/23 0217 11/12/23 0247 11/12/23 0950  AST 45* 22  --   ALT 49* 29  --   ALKPHOS 58 44  --   BILITOT 0.4 0.3  --   PROT 6.0* 4.9*  --   ALBUMIN  3.4* 2.8* 2.8*  Recent Labs  Lab 11/11/23 0217  LIPASE 54*   No results for input(s): AMMONIA in the last 168 hours. Coagulation Profile: Recent Labs  Lab 11/11/23 0217  INR 1.2   Cardiac Enzymes: No results for input(s): CKTOTAL, CKMB, CKMBINDEX, TROPONINI in the last 168 hours. BNP (last 3 results) No results for input(s): PROBNP in the last 8760 hours. HbA1C: No results for input(s): HGBA1C in the last 72 hours. CBG: Recent Labs  Lab 11/12/23 0606 11/12/23 1134 11/13/23 0043 11/13/23 1221 11/13/23 1717  GLUCAP 135* 146* 105* 130*  137*   Lipid Profile: No results for input(s): CHOL, HDL, LDLCALC, TRIG, CHOLHDL, LDLDIRECT in the last 72 hours. Thyroid  Function Tests: No results for input(s): TSH, T4TOTAL, FREET4, T3FREE, THYROIDAB in the last 72 hours. Anemia Panel: No results for input(s): VITAMINB12, FOLATE, FERRITIN, TIBC, IRON, RETICCTPCT in the last 72 hours. Urine analysis:    Component Value Date/Time   COLORURINE STRAW (A) 07/07/2020 1750   APPEARANCEUR CLEAR 07/07/2020 1750   LABSPEC 1.005 07/07/2020 1750   PHURINE 7.0 07/07/2020 1750   GLUCOSEU NEGATIVE 07/07/2020 1750   GLUCOSEU NEGATIVE 01/20/2016 1556   HGBUR LARGE (A) 07/07/2020 1750   HGBUR negative 08/02/2007 0849   BILIRUBINUR negative 12/02/2021 1321   KETONESUR 5 (A) 07/07/2020 1750   PROTEINUR Positive (A) 12/02/2021 1321   PROTEINUR NEGATIVE 07/07/2020 1750   UROBILINOGEN 0.2 12/02/2021 1321   UROBILINOGEN 0.2 01/20/2016 1556   NITRITE negative 12/02/2021 1321   NITRITE NEGATIVE 07/07/2020 1750   LEUKOCYTESUR Small (1+) (A) 12/02/2021 1321   LEUKOCYTESUR NEGATIVE 07/07/2020 1750   Sepsis Labs: @LABRCNTIP (procalcitonin:4,lacticidven:4)  ) Recent Results (from the past 240 hours)  Resp panel by RT-PCR (RSV, Flu A&B, Covid) Anterior Nasal Swab     Status: None   Collection Time: 11/11/23  2:20 AM   Specimen: Anterior Nasal Swab  Result Value Ref Range Status   SARS Coronavirus 2 by RT PCR NEGATIVE NEGATIVE Final    Comment: (NOTE) SARS-CoV-2 target nucleic acids are NOT DETECTED.  The SARS-CoV-2 RNA is generally detectable in upper respiratory specimens during the acute phase of infection. The lowest concentration of SARS-CoV-2 viral copies this assay can detect is 138 copies/mL. A negative result does not preclude SARS-Cov-2 infection and should not be used as the sole basis for treatment or other patient management decisions. A negative result may occur with  improper specimen  collection/handling, submission of specimen other than nasopharyngeal swab, presence of viral mutation(s) within the areas targeted by this assay, and inadequate number of viral copies(<138 copies/mL). A negative result must be combined with clinical observations, patient history, and epidemiological information. The expected result is Negative.  Fact Sheet for Patients:  BloggerCourse.com  Fact Sheet for Healthcare Providers:  SeriousBroker.it  This test is no t yet approved or cleared by the [place]  FDA and  has been authorized for detection and/or diagnosis of SARS-CoV-2 by FDA under an Emergency Use Authorization (EUA). This EUA will remain  in effect (meaning this test can be used) for the duration of the COVID-19 declaration under Section 564(b)(1) of the Act, 21 U.S.C.section 360bbb-3(b)(1), unless the authorization is terminated  or revoked sooner.       Influenza A by PCR NEGATIVE NEGATIVE Final   Influenza B by PCR NEGATIVE NEGATIVE Final    Comment: (NOTE) The Xpert Xpress SARS-CoV-2/FLU/RSV plus assay is intended as an aid in the diagnosis of influenza from Nasopharyngeal swab specimens and should not be used as a sole basis  for treatment. Nasal washings and aspirates are unacceptable for Xpert Xpress SARS-CoV-2/FLU/RSV testing.  Fact Sheet for Patients: BloggerCourse.com  Fact Sheet for Healthcare Providers: SeriousBroker.it  This test is not yet approved or cleared by the [place]  FDA and has been authorized for detection and/or diagnosis of SARS-CoV-2 by FDA under an Emergency Use Authorization (EUA). This EUA will remain in effect (meaning this test can be used) for the duration of the COVID-19 declaration under Section 564(b)(1) of the Act, 21 U.S.C. section 360bbb-3(b)(1), unless the authorization is terminated or revoked.     Resp Syncytial  Virus by PCR NEGATIVE NEGATIVE Final    Comment: (NOTE) Fact Sheet for Patients: BloggerCourse.com  Fact Sheet for Healthcare Providers: SeriousBroker.it  This test is not yet approved or cleared by the [place]  FDA and has been authorized for detection and/or diagnosis of SARS-CoV-2 by FDA under an Emergency Use Authorization (EUA). This EUA will remain in effect (meaning this test can be used) for the duration of the COVID-19 declaration under Section 564(b)(1) of the Act, 21 U.S.C. section 360bbb-3(b)(1), unless the authorization is terminated or revoked.  Performed at [facility], [address], [place], [place] [address]   MRSA Next Gen by PCR, Nasal     Status: Abnormal   Collection Time: 11/11/23  9:38 AM   Specimen: Nasal Mucosa; Nasal Swab  Result Value Ref Range Status   MRSA by PCR Next Gen DETECTED (A) NOT DETECTED Final    Comment: (NOTE) The GeneXpert MRSA Assay (FDA approved for NASAL specimens only), is one component of a comprehensive MRSA colonization surveillance program. It is not intended to diagnose MRSA infection nor to guide or monitor treatment for MRSA infections. Test performance is not FDA approved in patients less than 58 years old. Performed at [facility], [address] [place], [place] [address]          Radiology Studies: No results found.       Scheduled Meds:  atorvastatin   10 mg Oral q1800   bisacodyl   10 mg Rectal Daily   Chlorhexidine  Gluconate Cloth  6 each Topical Daily   divalproex   125 mg Oral QHS   [START ON 11/14/2023] levothyroxine   50 mcg Oral q morning   mupirocin  ointment   Nasal BID   pantoprazole  (PROTONIX ) IV  40 mg Intravenous Q12H   simethicone   80 mg Oral QID   Continuous Infusions:  ampicillin -sulbactam (UNASYN ) IV 3 g (11/13/23 1824)   dextrose  5 % and 0.45 % NaCl 75 mL/hr at 11/13/23 1743   dextrose         LOS: 2 days    Time spent: 55 minutes.    Leatrice Chapel, MD  [facility] Pager #: [phone] 7PM-7AM contact night coverage as above

## 2023-11-13 NOTE — Progress Notes (Signed)
 Hgb is 6.8 this morning. Plan to transfuse 1 unit RBCs.

## 2023-11-13 NOTE — Progress Notes (Signed)
 Daily Progress Note   Patient Name: [name].       Date: 11/13/2023 DOB: 1928-02-13  Age: 89 y.o. MRN#: [id number] Attending Physician: Rosario Leatrice FERNS, MD Primary Care Physician: Sherlynn Madden, MD Admit Date: 11/11/2023  Reason for Consultation/Follow-up: Establishing goals of care  Subjective:  Remains confused, opens eyes but doesn't recognize or verbalize meaningfully, humming periodically Remains in mittens Monitor noted   Length of Stay: 2  Current Medications: Scheduled Meds:   bisacodyl   10 mg Rectal Daily   Chlorhexidine  Gluconate Cloth  6 each Topical Daily   mupirocin  ointment   Nasal BID   pantoprazole  (PROTONIX ) IV  40 mg Intravenous Q12H   simethicone   80 mg Oral QID    Continuous Infusions:  ampicillin -sulbactam (UNASYN ) IV     dextrose  5 % and 0.45 % NaCl 75 mL/hr at 11/13/23 1200    PRN Meds: acetaminophen  **OR** acetaminophen , alum & mag hydroxide-simeth, haloperidol  lactate, lip balm, magic mouthwash, menthol , methocarbamol  (ROBAXIN ) injection, metoprolol  tartrate, naphazoline-glycerin , ondansetron  (ZOFRAN ) IV, ondansetron  **OR** [DISCONTINUED] ondansetron  (ZOFRAN ) IV, mouth rinse, phenol, prochlorperazine , sodium chloride   Physical Exam         Frail and weak appearing Has mittens Shallow regular breath sounds Monitor noted No edema  Vital Signs: BP 109/61 (BP Location: Right Arm)   Pulse 75   Temp (!) 97.4 F (36.3 C) (Axillary)   Resp (!) 22   Ht 5' 9 (1.753 m)   Wt 59.1 kg   SpO2 93%   BMI 19.24 kg/m  SpO2: SpO2: 93 % O2 Device: O2 Device: Room Air O2 Flow Rate: O2 Flow Rate (L/min): 6 L/min  Intake/output summary:  Intake/Output Summary (Last 24 hours) at 11/13/2023 1307 Last data filed at 11/13/2023 1200 Gross per  24 hour  Intake 1270.81 ml  Output --  Net 1270.81 ml   LBM: Last BM Date : 11/12/23 Baseline Weight: Weight: 59.1 kg Most recent weight: Weight: 59.1 kg       Palliative Assessment/Data:      Patient Active Problem List   Diagnosis Date Noted   Hematemesis 11/11/2023   Acute gastric volvulus 11/11/2023   Incarcerated hiatal hernia 11/11/2023   Transaminitis 11/11/2023   Mild protein malnutrition (HCC) 11/11/2023   Hyperglycemia 11/11/2023   Acute on chronic blood loss anemia 11/11/2023  Hypernatremia 11/11/2023   Excoriation 07/19/2023   Osteoarthritis, generalized 02/08/2023   Hematuria 12/31/2022   Vitamin B12 deficiency 11/09/2022   Dementia, senile with depression, with behavioral disturbance (HCC) 11/09/2022   Dementia with agitation (HCC) 08/17/2022   Bilateral leg edema 06/24/2021   GI bleed 01/10/2019   Melena    Acute blood loss anemia    Symptomatic anemia 01/09/2019   Fall at home, initial encounter 01/09/2019   Anemia due to multiple mechanisms 01/08/2019   Stricture or kinking of ureter 12/06/2018   Ureteral stricture, left 04/19/2018   PVC's (premature ventricular contractions) 11/07/2017   Urinary retention 02/04/2017   PAF (paroxysmal atrial fibrillation), CHA2DS2VASc score 4 05/03/2014   Dizziness 05/03/2014   Encounter for therapeutic drug monitoring 05/07/2013   S/P CABG x 2 03/26/2013   S/P MVR (mitral valve repair) 03/26/2013   S/P Maze operation for atrial fibrillation 03/26/2013   Current use of long term anticoagulation 03/16/2013   Chronic diastolic CHF (congestive heart failure) (HCC) 03/12/2013   Pre-syncope 03/12/2013   Pleural effusion, bilateral 03/12/2013   Delirium 03/12/2013   CAD (coronary artery disease) 03/06/2013   S/P mitral valve repair, maze procedure, and CABG x2 02/29/12 02/28/2013   Unstable angina (HCC) 02/26/2013   Special screening for malignant neoplasms, colon 10/05/2010   Benign neoplasm of colon 10/05/2010    Diverticulosis of colon 10/05/2010   ADENOCARCINOMA, PROSTATE, GLEASON GRADE 3 08/13/2009   SEBACEOUS CYST 08/13/2009   Hypothyroidism 08/02/2007   GERD 08/02/2007   Disorder of bone and cartilage 08/02/2007   UNS ADVRS EFF OTH RX MEDICINAL&BIOLOGICAL SBSTNC 08/02/2007   HLD (hyperlipidemia) 12/15/2006   PROSTATE SPECIFIC ANTIGEN, ELEVATED 12/15/2006   Essential hypertension 09/05/2006   Arthropathy 09/05/2006   History of colonic polyps 09/05/2006    Palliative Care Assessment & Plan   Patient Profile:    Assessment: Chronic hiatal hernia with organoaxial volvulus ABL/chronic blood loss anemia Advanced Dementia  poor oral intake at facility, prior to this hospitalization Non ambulatory at baseline EGD decompression successful  11-12-23 Getting 1u pRBCs in am on 11-13-23 Elevated serum creatinine  Recommendations/Plan:  SLP bedside eval, appears to be high aspiration risk.  Monitor hospital course and renal function and Hgb levels.  Goals of care and Disposition options -  Decline trajectory from advanced dementia, in the context of current hospitalization discussed frankly but compassionately with family at bedside. Also debriefed with RN colleague -  DNR DNI No artificial nutrition, dysphagia in the setting of advanced dementia.  Either [facility] versus residential hospice on discharge.     Code Status:    Code Status Orders  (From admission, onward)           Start     Ordered   11/11/23 0903  Do not attempt resuscitation (DNR)- Limited -Do Not Intubate (DNI)  (Code Status)  Continuous       Question Answer Comment  If pulseless and not breathing No CPR or chest compressions.   In Pre-Arrest Conditions (Patient Is Breathing and Has A Pulse) Do not intubate. Provide all appropriate non-invasive medical interventions. Avoid ICU transfer unless indicated or required.   Consent: Discussion documented in EHR or advanced directives reviewed       11/11/23 0903           Code Status History     Date Active Date Inactive Code Status Order ID Comments User Context   11/11/2023 0811 11/11/2023 0903 Full Code [id number]  Celinda,  Alm Lot, MD ED   01/09/2019 1935 01/12/2019 1753 Full Code [id number]  Alfornia Madison, MD ED   02/04/2017 1351 02/05/2017 1613 Full Code [id number]  Alvaro Hummer, MD Inpatient   03/12/2013 1836 03/14/2013 1822 Full Code [id number]  Cornelious Levada LITTIE DEVONNA Inpatient   03/02/2013 0946 03/06/2013 1614 Full Code [id number]  Dusty Sudie DEL, MD Inpatient   02/28/2013 1618 03/02/2013 0946 Full Code [id number]  Dusty Sudie DEL, MD Inpatient   02/26/2013 0254 02/28/2013 1618 Full Code [id number]  Silvester Ales, MD Inpatient       Prognosis:  guarded  Discharge Planning: [facility] versus residential hospice.   Care plan was discussed with  patient's wife, son , daughter in law RN  Thank you for allowing the Palliative Medicine Team to assist in the care of this patient. High MDM.  Greater than 50%  of this time was spent counseling and coordinating care related to the above assessment and plan.  Lonia Serve, MD  Please contact Palliative Medicine Team phone at [phone] for questions and concerns.

## 2023-11-13 NOTE — Progress Notes (Signed)
 1 Day Post-Op   Subjective/Chief Complaint: Got EGD yesterday.  Hospice/palliative care saw yesterday.     Objective: Vital signs in last 24 hours: Temp:  [97.2 F (36.2 C)-98.9 F (37.2 C)] 97.3 F (36.3 C) (09/21 0653) Pulse Rate:  [74-142] 91 (09/21 0800) Resp:  [15-28] 23 (09/21 0800) BP: (90-158)/(33-143) 126/76 (09/21 0800) SpO2:  [94 %-100 %] 94 % (09/21 0800) Weight:  [59.1 kg] 59.1 kg (09/20 1300) Last BM Date : 11/12/23  Intake/Output from previous day: 09/20 0701 - 09/21 0700 In: 1197.4 [I.V.:424.8; Blood:372.5; IV Piggyback:400.1] Out: -  Intake/Output this shift: No intake/output data recorded.  General:  in bed, alert. Not interactive today.  In soft mitten restraints.   Resp:  CTAB CV:  RR&R Abd:  soft, NT, ND, +BS Ext:  Warm, well perfused.    Lab Results:  Recent Labs    11/12/23 0950 11/12/23 1834 11/13/23 0246  WBC 11.6*  --  11.7*  HGB 7.4* 7.1* 6.8*  HCT 24.1* 23.6* 22.7*  PLT 197  --  194   BMET Recent Labs    11/12/23 0950 11/13/23 0246  NA 149* 153*  K 3.9 3.8  CL 111 111  CO2 25 21*  GLUCOSE 147* 125*  BUN 83* 115*  CREATININE 1.37* 2.23*  CALCIUM  8.6* 8.4*   PT/INR Recent Labs    11/11/23 0217  LABPROT 15.4*  INR 1.2   ABG No results for input(s): PHART, HCO3 in the last 72 hours.  Invalid input(s): PCO2, PO2  Studies/Results: No results found.   Anti-infectives: Anti-infectives (From admission, onward)    Start     Dose/Rate Route Frequency Ordered Stop   11/11/23 1200  Ampicillin -Sulbactam (UNASYN ) 3 g in sodium chloride  0.9 % 100 mL IVPB        3 g 200 mL/hr over 30 Minutes Intravenous Every 6 hours 11/11/23 0808     11/11/23 0530  Ampicillin -Sulbactam (UNASYN ) 3 g in sodium chloride  0.9 % 100 mL IVPB        3 g 200 mL/hr over 30 Minutes Intravenous  Once 11/11/23 0527 11/11/23 9375       Assessment/Plan: Chronic hiatal hernia with organoaxial volvulus ABL/chronic blood loss anemia   EGD  decompression successful yesterday.   Getting 1u pRBCs this AM.   Consider starting clears po today.  ? Need for speech eval for swallowing.  Not sure what baseline level is for him.  Would wait until blood transfusion is complete. Would also not give diet at first unsupervised.    Given age and dementia operative repair would be extremely high risk for him.   Surgery will follow.   No urgent need for surgical intervention   Moderate level of decision making.    LOS: 2 days    Jina LITTIE Nephew, MD, FACS, FSSO Surgical Oncology, General Surgery, Trauma and Critical [facility] Surgery, [place] [phone] for weekday/non holidays Check [email or web address] for coverage night/weekend/holidays

## 2023-11-13 NOTE — Plan of Care (Signed)
  Problem: Pain Managment: Goal: General experience of comfort will improve and/or be controlled Outcome: Progressing   Problem: Safety: Goal: Ability to remain free from injury will improve Outcome: Progressing   Problem: Skin Integrity: Goal: Risk for impaired skin integrity will decrease Outcome: Progressing

## 2023-11-14 ENCOUNTER — Encounter (HOSPITAL_COMMUNITY): Payer: Self-pay | Admitting: Gastroenterology

## 2023-11-14 DIAGNOSIS — K440 Diaphragmatic hernia with obstruction, without gangrene: Secondary | ICD-10-CM | POA: Diagnosis not present

## 2023-11-14 LAB — CBC WITH DIFFERENTIAL/PLATELET
Abs Immature Granulocytes: 0.2 K/uL — ABNORMAL HIGH (ref 0.00–0.07)
Basophils Absolute: 0 K/uL (ref 0.0–0.1)
Basophils Relative: 0 %
Eosinophils Absolute: 0 K/uL (ref 0.0–0.5)
Eosinophils Relative: 0 %
HCT: 27.5 % — ABNORMAL LOW (ref 39.0–52.0)
Hemoglobin: 8.5 g/dL — ABNORMAL LOW (ref 13.0–17.0)
Immature Granulocytes: 2 %
Lymphocytes Relative: 7 %
Lymphs Abs: 0.7 K/uL (ref 0.7–4.0)
MCH: 29.8 pg (ref 26.0–34.0)
MCHC: 30.9 g/dL (ref 30.0–36.0)
MCV: 96.5 fL (ref 80.0–100.0)
Monocytes Absolute: 0.7 K/uL (ref 0.1–1.0)
Monocytes Relative: 7 %
Neutro Abs: 8.4 K/uL — ABNORMAL HIGH (ref 1.7–7.7)
Neutrophils Relative %: 84 %
Platelets: 176 K/uL (ref 150–400)
RBC: 2.85 MIL/uL — ABNORMAL LOW (ref 4.22–5.81)
RDW: 19.1 % — ABNORMAL HIGH (ref 11.5–15.5)
WBC: 10 K/uL (ref 4.0–10.5)
nRBC: 1.1 % — ABNORMAL HIGH (ref 0.0–0.2)

## 2023-11-14 LAB — RENAL FUNCTION PANEL
Albumin: 2.8 g/dL — ABNORMAL LOW (ref 3.5–5.0)
Anion gap: 14 (ref 5–15)
BUN: 98 mg/dL — ABNORMAL HIGH (ref 8–23)
CO2: 23 mmol/L (ref 22–32)
Calcium: 8.2 mg/dL — ABNORMAL LOW (ref 8.9–10.3)
Chloride: 114 mmol/L — ABNORMAL HIGH (ref 98–111)
Creatinine, Ser: 2.12 mg/dL — ABNORMAL HIGH (ref 0.61–1.24)
GFR, Estimated: 28 mL/min — ABNORMAL LOW (ref >60)
Glucose, Bld: 159 mg/dL — ABNORMAL HIGH (ref 70–99)
Phosphorus: 3.8 mg/dL (ref 2.5–4.6)
Potassium: 2.9 mmol/L — ABNORMAL LOW (ref 3.5–5.1)
Sodium: 151 mmol/L — ABNORMAL HIGH (ref 135–145)

## 2023-11-14 LAB — SODIUM, URINE, RANDOM: Sodium, Ur: <30 mmol/L

## 2023-11-14 LAB — TYPE AND SCREEN
ABO/RH(D): O POS
Antibody Screen: NEGATIVE
Unit division: 0
Unit division: 0
Unit division: 0

## 2023-11-14 LAB — URINALYSIS, COMPLETE (UACMP) WITH MICROSCOPIC
Bacteria, UA: NONE SEEN
Bilirubin Urine: NEGATIVE
Glucose, UA: NEGATIVE mg/dL
Ketones, ur: NEGATIVE mg/dL
Nitrite: NEGATIVE
Protein, ur: 100 mg/dL — AB
RBC / HPF: >50 RBC/hpf (ref 0–5)
Specific Gravity, Urine: 1.019 (ref 1.005–1.030)
WBC, UA: >50 WBC/hpf (ref 0–5)
pH: 5 (ref 5.0–8.0)

## 2023-11-14 LAB — GLUCOSE, CAPILLARY
Glucose-Capillary: 142 mg/dL — ABNORMAL HIGH (ref 70–99)
Glucose-Capillary: 143 mg/dL — ABNORMAL HIGH (ref 70–99)

## 2023-11-14 LAB — BPAM RBC
Blood Product Expiration Date: 202509252359
Blood Product Expiration Date: 202510212359
Blood Product Expiration Date: 202510212359
ISSUE DATE / TIME: 202509200451
ISSUE DATE / TIME: 202509210629
ISSUE DATE / TIME: 202509211755
Unit Type and Rh: 5100
Unit Type and Rh: 5100
Unit Type and Rh: 5100

## 2023-11-14 MED ORDER — HYDROMORPHONE HCL 1 MG/ML IJ SOLN: 1.0000 mg | INTRAMUSCULAR | Status: DC | PRN

## 2023-11-14 NOTE — Progress Notes (Signed)
 [facility] Liaison Note  Received request from Transitions of Care Manager Jon for hospice services at home after discharge. Spoke with patient's son Ubaldo to initiate education related to hospice philosophy, services, and team approach to care. He was on speaker phone and had his wife and patient's wife listening to the conversation. Family verbalized understanding of information given. Per discussion, the plan is for discharge home by [facility] today 09.22.2025.  DME needs discussed. Patient has a hospital bed in the home and requires no other equipment for delivery.   Please send signed and completed DNR home with patient.  Please provide prescriptions at discharge as needed to ensure ongoing symptom management.   [facility] information and contact numbers given to [place]. Above information shared with Transitions of Care Manager Jon. Please call with any questions or concerns.   Thank you for the opportunity to participate in this patient's care.   Amy Darien BSN, RN [facility] Liaison [phone]

## 2023-11-14 NOTE — Plan of Care (Signed)
  Problem: Education: Goal: Knowledge of General Education information will improve Description: Including pain rating scale, medication(s)/side effects and non-pharmacologic comfort measures Outcome: Not Progressing   Problem: Nutrition: Goal: Adequate nutrition will be maintained Outcome: Not Progressing   Problem: Safety: Goal: Ability to remain free from injury will improve Outcome: Progressing

## 2023-11-14 NOTE — Progress Notes (Signed)
 SLP Cancellation Note  Patient Details Name: [name]. MRN: [id number] DOB: 04-Nov-1927   Cancelled treatment:       Reason Eval/Treat Not Completed: Other (comment) Patient transitioning to comfort care. SLP to respectfully s/o at this time.  Norleen IVAR Blase, MA, CCC-SLP Speech Therapy

## 2023-11-14 NOTE — TOC Initial Note (Signed)
 Transition of Care (TOC) - Initial/Assessment Note    Patient Details  Name: [name]. MRN: [id number] Date of Birth: 1927-05-16  Transition of Care [facility]) CM/SW Contact:    Jon ONEIDA Anon, RN Phone Number: 11/14/2023, 2:29 PM  Clinical Narrative:                 Pt is from [facility]. Pt will return to facility with hospice care services through [facility]. NCM spoke with Eleanor, hospital liaison for [facility] and she accepts referral. Spoke with [name] at [facility] and facility has ready bed. ICM continuing to follow to assist with any DC needs.  Expected Discharge Plan: Home w Hospice Care Barriers to Discharge: Continued Medical Work up   Patient Goals and CMS Choice Patient states their goals for this hospitalization and ongoing recovery are:: To return to SNF with hospice care CMS Medicare.gov Compare Post Acute Care list provided to:: Patient Represenative (must comment) Choice offered to / list presented to : Spouse [place] ownership interest in [facility].provided to:: Spouse    Expected Discharge Plan and Services In-house Referral: Hospice / Palliative Care Discharge Planning Services: CM Consult Post Acute Care Choice: Hospice Living arrangements for the past 2 months: Skilled Nursing Facility                 DME Arranged: N/A DME Agency: NA       HH Arranged: NA HH Agency: Other - See comment Photographer) Date HH Agency Contacted: 11/14/23 Time HH Agency Contacted: 1200 Representative spoke with at [facility] Agency: Eleanor Nail, Hospital Liaison for [facility]  Prior Living Arrangements/Services Living arrangements for the past 2 months: Skilled Nursing Facility Lives with:: Facility Resident Patient language and need for interpreter reviewed:: Yes Do you feel safe going back to the place where you live?: Yes      Need for Family Participation in Patient Care: Yes (Comment) Care giver support system in  place?: Yes (comment) Current home services: DME Criminal Activity/Legal Involvement Pertinent to Current Situation/Hospitalization: No - Comment as needed  Activities of Daily Living   ADL Screening (condition at time of admission) Independently performs ADLs?: No Does the patient have a NEW difficulty with bathing/dressing/toileting/self-feeding that is expected to last >3 days?: No Does the patient have a NEW difficulty with getting in/out of bed, walking, or climbing stairs that is expected to last >3 days?: No Does the patient have a NEW difficulty with communication that is expected to last >3 days?: No Is the patient deaf or have difficulty hearing?: Yes Does the patient have difficulty seeing, even when wearing glasses/contacts?: No Does the patient have difficulty concentrating, remembering, or making decisions?: Yes  Permission Sought/Granted Permission sought to share information with : Family Supports Permission granted to share information with : Yes, Verbal Permission Granted  Share Information with NAME: [name], [name] (Spouse)  [phone]           Emotional Assessment Appearance:: Appears stated age Attitude/Demeanor/Rapport: Unable to Assess Affect (typically observed): Unable to Assess Orientation: :  (Pt is disoriented x4) Alcohol / Substance Use: Not Applicable Psych Involvement: No (comment)  Admission diagnosis:  Hematemesis [K92.0] Gastric volvulus [K31.89] Patient Active Problem List   Diagnosis Date Noted   Hematemesis 11/11/2023   Acute gastric volvulus 11/11/2023   Incarcerated hiatal hernia 11/11/2023   Transaminitis 11/11/2023   Mild protein malnutrition (HCC) 11/11/2023   Hyperglycemia 11/11/2023   Acute on chronic blood loss anemia 11/11/2023  Hypernatremia 11/11/2023   Excoriation 07/19/2023   Osteoarthritis, generalized 02/08/2023   Hematuria 12/31/2022   Vitamin B12 deficiency 11/09/2022   Dementia, senile with depression, with  behavioral disturbance (HCC) 11/09/2022   Dementia with agitation (HCC) 08/17/2022   Bilateral leg edema 06/24/2021   GI bleed 01/10/2019   Melena    Acute blood loss anemia    Symptomatic anemia 01/09/2019   Fall at home, initial encounter 01/09/2019   Anemia due to multiple mechanisms 01/08/2019   Stricture or kinking of ureter 12/06/2018   Ureteral stricture, left 04/19/2018   PVC's (premature ventricular contractions) 11/07/2017   Urinary retention 02/04/2017   PAF (paroxysmal atrial fibrillation), CHA2DS2VASc score 4 05/03/2014   Dizziness 05/03/2014   Encounter for therapeutic drug monitoring 05/07/2013   S/P CABG x 2 03/26/2013   S/P MVR (mitral valve repair) 03/26/2013   S/P Maze operation for atrial fibrillation 03/26/2013   Current use of long term anticoagulation 03/16/2013   Chronic diastolic CHF (congestive heart failure) (HCC) 03/12/2013   Pre-syncope 03/12/2013   Pleural effusion, bilateral 03/12/2013   Delirium 03/12/2013   CAD (coronary artery disease) 03/06/2013   S/P mitral valve repair, maze procedure, and CABG x2 02/29/12 02/28/2013   Unstable angina (HCC) 02/26/2013   Special screening for malignant neoplasms, colon 10/05/2010   Benign neoplasm of colon 10/05/2010   Diverticulosis of colon 10/05/2010   ADENOCARCINOMA, PROSTATE, GLEASON GRADE 3 08/13/2009   SEBACEOUS CYST 08/13/2009   Hypothyroidism 08/02/2007   GERD 08/02/2007   Disorder of bone and cartilage 08/02/2007   UNS ADVRS EFF OTH RX MEDICINAL&BIOLOGICAL SBSTNC 08/02/2007   HLD (hyperlipidemia) 12/15/2006   PROSTATE SPECIFIC ANTIGEN, ELEVATED 12/15/2006   Essential hypertension 09/05/2006   Arthropathy 09/05/2006   History of colonic polyps 09/05/2006   PCP:  Sherlynn Madden, MD Pharmacy:   [id number] [place], [place] - [address] [place] [address] Phone: [phone] Fax: [phone]  [facility] - [place], [place] - [address] [place] [address] Phone: [phone] Fax: [phone]     Social Drivers of Health (SDOH) Social History: SDOH Screenings   Food Insecurity: No Food Insecurity (12/29/2022)  Housing: Low Risk  (12/29/2022)  Transportation Needs: No Transportation Needs (12/29/2022)  Utilities: Not At Risk (12/29/2022)  Alcohol Screen: Low Risk  (12/29/2022)  Depression (PHQ2-9): Low Risk  (12/29/2022)  Financial Resource Strain: Low Risk  (12/29/2022)  Physical Activity: Inactive (12/29/2022)  Social Connections: Moderately Isolated (12/29/2022)  Stress: No Stress Concern Present (12/29/2022)  Tobacco Use: Low Risk  (11/13/2023)  Health Literacy: Inadequate Health Literacy (12/29/2022)   SDOH Interventions:     Readmission Risk Interventions    11/14/2023    2:02 PM  Readmission Risk Prevention Plan  Transportation Screening Complete  PCP or Specialist Appt within 3-5 Days Complete  HRI or Home Care Consult Complete  Social Work Consult for Recovery Care Planning/Counseling Complete  Palliative Care Screening Complete  Medication Review Oceanographer) Complete

## 2023-11-14 NOTE — [place] FL2 (Signed)
 [place]  MEDICAID FL2 LEVEL OF CARE FORM     IDENTIFICATION  Patient Name: [name]. Birthdate: 1928-02-06 Sex: male Admission Date (Current Location): 11/11/2023  [facility] and [place] Number:  Producer, television/film/video and Address:  [facility],  [address], [place] [address]      Provider Number: [id number]  Attending Physician Name and Address:  Rosario Leatrice FERNS, MD  Relative Name and Phone Number:  Ulas, Zuercher (Spouse)  [phone]    Current Level of Care: Hospital Recommended Level of Care: Nursing Facility Prior Approval Number:    Date Approved/Denied:   PASRR Number: [id number]  Discharge Plan: SNF    Current Diagnoses: Patient Active Problem List   Diagnosis Date Noted   Hematemesis 11/11/2023   Acute gastric volvulus 11/11/2023   Incarcerated hiatal hernia 11/11/2023   Transaminitis 11/11/2023   Mild protein malnutrition (HCC) 11/11/2023   Hyperglycemia 11/11/2023   Acute on chronic blood loss anemia 11/11/2023   Hypernatremia 11/11/2023   Excoriation 07/19/2023   Osteoarthritis, generalized 02/08/2023   Hematuria 12/31/2022   Vitamin B12 deficiency 11/09/2022   Dementia, senile with depression, with behavioral disturbance (HCC) 11/09/2022   Dementia with agitation (HCC) 08/17/2022   Bilateral leg edema 06/24/2021   GI bleed 01/10/2019   Melena    Acute blood loss anemia    Symptomatic anemia 01/09/2019   Fall at home, initial encounter 01/09/2019   Anemia due to multiple mechanisms 01/08/2019   Stricture or kinking of ureter 12/06/2018   Ureteral stricture, left 04/19/2018   PVC's (premature ventricular contractions) 11/07/2017   Urinary retention 02/04/2017   PAF (paroxysmal atrial fibrillation), CHA2DS2VASc score 4 05/03/2014   Dizziness 05/03/2014   Encounter for therapeutic drug monitoring 05/07/2013   S/P CABG x 2 03/26/2013   S/P MVR (mitral valve repair) 03/26/2013   S/P Maze operation for atrial fibrillation  03/26/2013   Current use of long term anticoagulation 03/16/2013   Chronic diastolic CHF (congestive heart failure) (HCC) 03/12/2013   Pre-syncope 03/12/2013   Pleural effusion, bilateral 03/12/2013   Delirium 03/12/2013   CAD (coronary artery disease) 03/06/2013   S/P mitral valve repair, maze procedure, and CABG x2 02/29/12 02/28/2013   Unstable angina (HCC) 02/26/2013   Special screening for malignant neoplasms, colon 10/05/2010   Benign neoplasm of colon 10/05/2010   Diverticulosis of colon 10/05/2010   ADENOCARCINOMA, PROSTATE, GLEASON GRADE 3 08/13/2009   SEBACEOUS CYST 08/13/2009   Hypothyroidism 08/02/2007   GERD 08/02/2007   Disorder of bone and cartilage 08/02/2007   UNS ADVRS EFF OTH RX MEDICINAL&BIOLOGICAL SBSTNC 08/02/2007   HLD (hyperlipidemia) 12/15/2006   PROSTATE SPECIFIC ANTIGEN, ELEVATED 12/15/2006   Essential hypertension 09/05/2006   Arthropathy 09/05/2006   History of colonic polyps 09/05/2006    Orientation RESPIRATION BLADDER Height & Weight      (Disoriented x4)  Normal Incontinent Weight: 59.1 kg Height:  5' 9 (175.3 cm)  BEHAVIORAL SYMPTOMS/MOOD NEUROLOGICAL BOWEL NUTRITION STATUS      Incontinent Diet (General)  AMBULATORY STATUS COMMUNICATION OF NEEDS Skin   Extensive Assist Verbally Normal                       Personal Care Assistance Level of Assistance  Bathing, Feeding, Dressing Bathing Assistance: Limited assistance Feeding assistance: Limited assistance Dressing Assistance: Limited assistance     Functional Limitations Info  Sight, Hearing, Speech Sight Info: Impaired Hearing Info: Impaired Speech Info: Impaired    SPECIAL CARE FACTORS FREQUENCY  Contractures Contractures Info: Not present    Additional Factors Info  Code Status, Allergies Code Status Info: DNR Allergies Info: Other           Current Medications (11/14/2023):  This is the current hospital active medication list Current  Facility-Administered Medications  Medication Dose Route Frequency Provider Last Rate Last Admin   acetaminophen  (TYLENOL ) tablet 650 mg  650 mg Oral Q6H PRN Celinda Alm Lot, MD       Or   acetaminophen  (TYLENOL ) suppository 650 mg  650 mg Rectal Q6H PRN Celinda Alm Lot, MD       Ampicillin -Sulbactam (UNASYN ) 3 g in sodium chloride  0.9 % 100 mL IVPB  3 g Intravenous Q12H James, Melissa, [facility]   Stopped at 11/14/23 9043   bisacodyl  (DULCOLAX) suppository 10 mg  10 mg Rectal Daily Sheldon Standing, MD   10 mg at 11/14/23 9074   Chlorhexidine  Gluconate Cloth 2 % PADS 6 each  6 each Topical Daily Celinda Alm Lot, MD   6 each at 11/14/23 9074   dextrose  5 % solution   Intravenous Continuous Rosario Eland I, MD 100 mL/hr at 11/14/23 1400 Infusion Verify at 11/14/23 1400   divalproex  (DEPAKOTE ) DR tablet 125 mg  125 mg Oral QHS Rosario Eland I, MD       HYDROmorphone  (DILAUDID ) injection 1 mg  1 mg Intravenous Q3H PRN Anwar, Zeba, MD       lip balm (CARMEX) ointment   Topical PRN Celinda Alm Lot, MD       magic mouthwash  15 mL Oral QID PRN Sheldon Standing, MD       methocarbamol  (ROBAXIN ) injection 1,000 mg  1,000 mg Intravenous Q6H PRN Sheldon Standing, MD   1,000 mg at 11/13/23 0308   metoprolol  tartrate (LOPRESSOR ) injection 5 mg  5 mg Intravenous Q6H PRN Sheldon Standing, MD   5 mg at 11/12/23 9673   mupirocin  ointment (BACTROBAN ) 2 %   Nasal BID Rosario Eland I, MD   1 Application at 11/14/23 9074   naphazoline-glycerin  (CLEAR EYES REDNESS) ophth solution 1-2 drop  1-2 drop Both Eyes QID PRN Sheldon Standing, MD       ondansetron  (ZOFRAN ) injection 4 mg  4 mg Intravenous Q6H PRN Sheldon Standing, MD       ondansetron  (ZOFRAN ) tablet 4 mg  4 mg Oral Q6H PRN Celinda Alm Lot, MD       Oral care mouth rinse  15 mL Mouth Rinse PRN Celinda Alm Lot, MD       pantoprazole  (PROTONIX ) injection 40 mg  40 mg Intravenous Q12H Celinda Alm Lot, MD   40 mg at 11/14/23 9074   phenol  (CHLORASEPTIC) mouth spray 2 spray  2 spray Mouth/Throat PRN Sheldon Standing, MD       prochlorperazine  (COMPAZINE ) injection 5-10 mg  5-10 mg Intravenous Q4H PRN Sheldon Standing, MD   5 mg at 11/14/23 9781   sodium chloride  (OCEAN) 0.65 % nasal spray 1-2 spray  1-2 spray Each Nare Q6H PRN Sheldon Standing, MD         Discharge Medications: Please see discharge summary for a list of discharge medications.  Relevant Imaging Results:  Relevant Lab Results:   Additional Information SSN: [id number]  Jon ONEIDA Anon, RN

## 2023-11-14 NOTE — TOC Transition Note (Signed)
 Transition of Care [facility]) - Discharge Note   Patient Details  Name: [name]. MRN: [id number] Date of Birth: 12/31/1927  [facility]) CM/SW Contact:  Jon ONEIDA Anon, RN Phone Number: 11/14/2023, 3:45 PM   Clinical Narrative:    Pt will discharge back to [facility] with hospice care services. Pt wife and son are in agreement with DC plan. Pt will receive hospice care services through [facility]. Pt will transport by PTAR at DC. PTAR called at [phone]. DC packet placed at RN station. RN given number to call report to room at [phone] or [phone] ext [phone]. There are no other ICM needs at this time.    Final next level of care: Skilled Nursing Facility (With Hospice Care) Barriers to Discharge: No Barriers Identified   Patient Goals and CMS Choice Patient states their goals for this hospitalization and ongoing recovery are:: To return to [facility] and receive hospice care services CMS Medicare.gov Compare Post Acute Care list provided to:: Patient Represenative (must comment) Choice offered to / list presented to : Spouse [place] ownership interest in [facility].provided to:: Spouse    Discharge Placement              Patient chooses bed at: [facility] Patient to be transferred to facility by: PTAR Name of family member notified: Tobias, [facility])  [phone] Patient and family notified of of transfer: 11/14/23  Discharge Plan and Services Additional resources added to the After Visit Summary for   In-house Referral: Hospice / Palliative Care Discharge Planning Services: CM Consult Post Acute Care Choice: Hospice          DME Arranged: N/A DME Agency: NA       HH Arranged: NA HH Agency: Other - See comment Photographer) Date HH Agency Contacted: 11/14/23 Time HH Agency Contacted: 1200 Representative spoke with at [facility] Agency: Eleanor Nail, Hospital Liaison for  [facility]  Social Drivers of Health (SDOH) Interventions SDOH Screenings   Food Insecurity: No Food Insecurity (12/29/2022)  Housing: Low Risk  (12/29/2022)  Transportation Needs: No Transportation Needs (12/29/2022)  Utilities: Not At Risk (12/29/2022)  Alcohol Screen: Low Risk  (12/29/2022)  Depression (PHQ2-9): Low Risk  (12/29/2022)  Financial Resource Strain: Low Risk  (12/29/2022)  Physical Activity: Inactive (12/29/2022)  Social Connections: Moderately Isolated (12/29/2022)  Stress: No Stress Concern Present (12/29/2022)  Tobacco Use: Low Risk  (11/13/2023)  Health Literacy: Inadequate Health Literacy (12/29/2022)     Readmission Risk Interventions    11/14/2023    2:02 PM  Readmission Risk Prevention Plan  Transportation Screening Complete  PCP or Specialist Appt within 3-5 Days Complete  HRI or Home Care Consult Complete  Social Work Consult for Recovery Care Planning/Counseling Complete  Palliative Care Screening Complete  Medication Review Oceanographer) Complete

## 2023-11-14 NOTE — Progress Notes (Signed)
 Daily Progress Note   Patient Name: [name].       Date: 11/14/2023 DOB: [birth year]  Age: 89 y.o. MRN#: [id number] Attending Physician: Rosario Leatrice FERNS, MD Primary Care Physician: Sherlynn Madden, MD Admit Date: 11/11/2023  Reason for Consultation/Follow-up: Establishing goals of care  Subjective:  Remains confused, opens eyes but doesn't recognize or verbalize meaningfully, family at bedside.  Remains in mittens Monitor noted   Length of Stay: 3  Current Medications: Scheduled Meds:   bisacodyl   10 mg Rectal Daily   Chlorhexidine  Gluconate Cloth  6 each Topical Daily   divalproex   125 mg Oral QHS   mupirocin  ointment   Nasal BID   pantoprazole  (PROTONIX ) IV  40 mg Intravenous Q12H    Continuous Infusions:  ampicillin -sulbactam (UNASYN ) IV Stopped (11/14/23 0956)   dextrose  100 mL/hr at 11/14/23 1018    PRN Meds: acetaminophen  **OR** acetaminophen , HYDROmorphone  (DILAUDID ) injection, lip balm, magic mouthwash, methocarbamol  (ROBAXIN ) injection, metoprolol  tartrate, naphazoline-glycerin , ondansetron  (ZOFRAN ) IV, ondansetron  **OR** [DISCONTINUED] ondansetron  (ZOFRAN ) IV, mouth rinse, phenol, prochlorperazine , sodium chloride   Physical Exam         Frail and weak appearing Has mittens Shallow regular breath sounds Monitor noted No edema  Vital Signs: BP (!) 149/84   Pulse 91   Temp 97.8 F (36.6 C) (Axillary)   Resp 18   Ht 5' 9 (1.753 m)   Wt 59.1 kg   SpO2 100%   BMI 19.24 kg/m  SpO2: SpO2: 100 % O2 Device: O2 Device: Room Air O2 Flow Rate: O2 Flow Rate (L/min): 6 L/min  Intake/output summary:  Intake/Output Summary (Last 24 hours) at 11/14/2023 1224 Last data filed at 11/14/2023 1004 Gross per 24 hour  Intake 2813.06 ml  Output 1475 ml   Net 1338.06 ml   LBM: Last BM Date : 11/14/23 Baseline Weight: Weight: 59.1 kg Most recent weight: Weight: 59.1 kg       Palliative Assessment/Data:      Patient Active Problem List   Diagnosis Date Noted   Hematemesis 11/11/2023   Acute gastric volvulus 11/11/2023   Incarcerated hiatal hernia 11/11/2023   Transaminitis 11/11/2023   Mild protein malnutrition (HCC) 11/11/2023   Hyperglycemia 11/11/2023   Acute on chronic blood loss anemia 11/11/2023   Hypernatremia 11/11/2023   Excoriation 07/19/2023   Osteoarthritis, generalized  02/08/2023   Hematuria 12/31/2022   Vitamin B12 deficiency 11/09/2022   Dementia, senile with depression, with behavioral disturbance (HCC) 11/09/2022   Dementia with agitation (HCC) 08/17/2022   Bilateral leg edema 06/24/2021   GI bleed 01/10/2019   Melena    Acute blood loss anemia    Symptomatic anemia 01/09/2019   Fall at home, initial encounter 01/09/2019   Anemia due to multiple mechanisms 01/08/2019   Stricture or kinking of ureter 12/06/2018   Ureteral stricture, left 04/19/2018   PVC's (premature ventricular contractions) 11/07/2017   Urinary retention 02/04/2017   PAF (paroxysmal atrial fibrillation), CHA2DS2VASc score 4 05/03/2014   Dizziness 05/03/2014   Encounter for therapeutic drug monitoring 05/07/2013   S/P CABG x 2 03/26/2013   S/P MVR (mitral valve repair) 03/26/2013   S/P Maze operation for atrial fibrillation 03/26/2013   Current use of long term anticoagulation 03/16/2013   Chronic diastolic CHF (congestive heart failure) (HCC) 03/12/2013   Pre-syncope 03/12/2013   Pleural effusion, bilateral 03/12/2013   Delirium 03/12/2013   CAD (coronary artery disease) 03/06/2013   S/P mitral valve repair, maze procedure, and CABG x2 02/29/12 02/28/2013   Unstable angina (HCC) 02/26/2013   Special screening for malignant neoplasms, colon 10/05/2010   Benign neoplasm of colon 10/05/2010   Diverticulosis of colon 10/05/2010    ADENOCARCINOMA, PROSTATE, GLEASON GRADE 3 08/13/2009   SEBACEOUS CYST 08/13/2009   Hypothyroidism 08/02/2007   GERD 08/02/2007   Disorder of bone and cartilage 08/02/2007   UNS ADVRS EFF OTH RX MEDICINAL&BIOLOGICAL SBSTNC 08/02/2007   HLD (hyperlipidemia) 12/15/2006   PROSTATE SPECIFIC ANTIGEN, ELEVATED 12/15/2006   Essential hypertension 09/05/2006   Arthropathy 09/05/2006   History of colonic polyps 09/05/2006    Palliative Care Assessment & Plan   Patient Profile:    Assessment: Chronic hiatal hernia with organoaxial volvulus ABL/chronic blood loss anemia Advanced Dementia  poor oral intake at facility, prior to this hospitalization Non ambulatory at baseline EGD decompression successful  11-12-23 Getting 1u pRBCs in am on 11-13-23 Elevated serum creatinine  Recommendations/Plan:  SLP bedside eval noted they recommend NPO due to ongoing high aspiration risk.  Patient's hospital course is one of ongoing decline and his  renal function remains compromised.  Goals of care and Disposition options -  Decline trajectory from advanced dementia, in the context of current hospitalization discussed frankly but compassionately with family at bedside again.    DNR DNI comfort measures  Discontinue medications and interventions not important for comfort.  No artificial nutrition, dysphagia in the setting of advanced dementia.   [facility]charge [facility] with hosp[facility] services for continuation of comfort measures.     Code Status:    Code Status Orders  (From admission, onward)           Start     Ordered   11/11/23 0903  Do not attempt resuscitation (DNR)- Limited -Do Not Intubate (DNI)  (Code Status)  Continuous       Question Answer Comment  If pulseless and not breathing No CPR or chest compressions.   In Pre-Arrest Conditions (Patient Is Breathing and Has A Pulse) Do not intubate. Provide all appropriate non-invasive medical interventions. Avoid ICU transfer unless  indicated or required.   Consent: Discussion documented in EHR or advanced directives reviewed      11/11/23 0903           Code Status History     Date Active Date Inactive Code Status Order ID Comments User Context  11/11/2023 0811 11/11/2023 0903 Full Code [id number]  Celinda Alm Lot, MD ED   01/09/2019 1935 01/12/2019 1753 Full Code [id number]  Alfornia Madison, MD ED   02/04/2017 1351 02/05/2017 1613 Full Code [id number]  Alvaro Hummer, MD Inpatient   03/12/2013 1836 03/14/2013 1822 Full Code [id number]  Cornelious Levada LITTIE DEVONNA Inpatient   03/02/2013 0946 03/06/2013 1614 Full Code [id number]  Dusty Sudie DEL, MD Inpatient   02/28/2013 1618 03/02/2013 0946 Full Code [id number]  Dusty Sudie DEL, MD Inpatient   02/26/2013 0254 02/28/2013 1618 Full Code [id number]  Silvester Ales, MD Inpatient       Prognosis: Less than 2 weeks.   Discharge Planning: Back to [facility] with Hospice    Care plan was discussed with  patient's wife, son , daughter in law    Thank you for allowing the Palliative Medicine Team to assist in the care of this patient. High MDM.  Greater than 50%  of this time was spent counseling and coordinating care related to the above assessment and plan.  Lonia Serve, MD  Please contact Palliative Medicine Team phone at [phone] for questions and concerns.

## 2023-11-14 NOTE — Discharge Summary (Signed)
 Physician Discharge Summary  Patient ID: [name] [name]. MRN: [id number] DOB/AGE: Jun 30, 1927 89 y.o.  Admit date: 11/11/2023 Discharge date: 11/14/2023  Admission Diagnoses:  Discharge Diagnoses:  Principal Problem:   Incarcerated hiatal hernia Active Problems:   Hypothyroidism   HLD (hyperlipidemia)   Essential hypertension   GERD   CAD (coronary artery disease)   Chronic diastolic CHF (congestive heart failure) (HCC)   PAF (paroxysmal atrial fibrillation), CHA2DS2VASc score 4   Dementia, senile with depression, with behavioral disturbance (HCC)   Hematemesis   Acute gastric volvulus   Transaminitis   Mild protein malnutrition (HCC)   Hyperglycemia   Acute on chronic blood loss anemia   Hypernatremia   Discharged Condition: stable  Hospital Course: Patient is a 89 year old male with past medical history significant for frequent falls, CAD, CABG, chronic diastolic heart failure, paroxysmal atrial fibrillation, s/p maze operation for atrial fibrillation, mitral valve disease, status post MVR, presyncope, dementia, osteoarthritis, bilateral pleural effusion, prostate cancer, diverticulosis, hematuria, history of GI bleed, vitamin B12 deficiency who was brought from his nursing facility due to coffee-ground emesis.  Workup revealed volvulus.  Surgery team was consulted to assist with patient's management.  Patient was felt to be a high risk patient for surgery.  GI team was consulted and patient underwent EGD.  Decompression of the volvulus was done during the EEG leading to complete compression, as well as, fluid aspiration (in the lower esophagus, cardia and gastric fundus).  Patient was also managed with IV Unasyn , considering likely risk for aspiration/aspiration pneumonia.  Patient remained critically ill, and the palliative care team was consulted.  The decision has been made to proceed with comfort directed care.  Patient be discharged back to the skilled nursing facility with  comfort measures.  Gastric volvulus:  - See above documentation. - GI team performed EGD and decompression of the gastric volvulus.   Acute on chronic blood loss anemia/coffee-ground emesis: - Patient was transfused with 2 units of packed red blood cells during the hospital stay. - Hemoglobin dropped to 6.8 g/dL, but improved 8.3 g/dL with blood transfusion.SABRA        GERD PPI was continued during the hospital stay.       Hypernatremia/dehydration: -Nausea vomiting/coffee-ground emesis on presentation. - Gastric volvulus as above. - IV fluid was continued during the hospital stay.       Chronic diastolic CHF (congestive heart failure) (HCC) -Seeemed clinically compensated.     PAF (paroxysmal atrial fibrillation),  Not on anticoagulation. Heart rate is controlled.     CAD (coronary artery disease) -Stable.     Hypothyroidism Resume levothyroxine       HLD (hyperlipidemia) Resume atorvastatin .       Essential hypertension -Continue to monitor and optimize.       Dementia, senile with depression,    with behavioral disturbance (HCC) -No behavioral problems noted currently.     Transaminitis -Resolved.   Moderate protein malnutrition (HCC) Acute kidney injury: - AKI has plateaued. - Likely volume related.    Consults: GI, general surgery, and palliative care team  Significant Diagnostic Studies:  CT angio GI bleed revealed: 1. Acute Gastric Outlet Obstruction secondary to Gastric Volvulus in the setting of chronic large hiatal hernia and intrathoracic stomach. See Chest CT today for additional details of the stomach.   2. NOTE ALSO the midline segment of the Transverse Colon also appears to be herniated and twisted in conjunction with #1. However, there are no dilated or obstructed bowel loops at this  time distal to the stomach. No pneumoperitoneum or free fluid.   3. No active gastrointestinal bleeding identified by CTA.   4. No other acute finding in the  abdomen or pelvis. Advanced large bowel diverticulosis. Advanced Aortic Atherosclerosis (ICD10-I70.0). Chronic left renal double-J ureteral stent. Chronic L5 compression fracture.  CT chest revealed: 1. Positive for Acute Gastric Volvulus in the setting of chronic intrathoracic stomach from large chronic hiatal hernia. Deedra this is an organo-axial volvulus. Dilated air and fluid containing stomach and esophagus which - rather than fully intrathoracic as seen in 2023 - distal stomach now is subdiaphragmatic, with twisted, compressed and obstructed gastric outlet (series 2, image 54).   2. Small volume of free fluid in the posterior mediastinum and/or left hemithorax. No pneumomediastinum. No pneumoperitoneum in the visible upper abdomen (see also CTA Abdomen and Pelvis today reported separately.).   3. Associated early bilateral lung infection with right upper lobe dependent bronchopneumonia and left lower lobe atelectasis versus consolidation.   EGD revealed: - Dilation in the lower third of the esophagus.                           - Fluid in the lower third of the esophagus.                           - LA Grade C esophagitis with no bleeding.                           - Retained gastric fluid. Fluid aspiration                            performed.                           - Gastric volvulus.                           - Normal duodenal bulb, first portion of the                            duodenum and second portion of the duodenum.   Treatments:  See above (EGD)   Discharge Exam: Blood pressure 137/66, pulse 80, temperature 97.8 F (36.6 C), temperature source Axillary, resp. rate 14, height 5' 9 (1.753 m), weight 59.1 kg, SpO2 100%.   Disposition: Discharge disposition: 03-Skilled Nursing Facility       Discharge Instructions     Diet general   Complete by: As directed       Allergies as of 11/14/2023       Reactions   Other Other (See Comments)    ANESTHESIA.... 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 ANESTHESIA.... 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 Occurred post op CABG 2015, required readmission and rehab        Medication List     STOP taking these medications    acetaminophen  325 MG tablet Commonly known as: TYLENOL    acetaminophen  500 MG tablet Commonly known as: TYLENOL    amoxicillin 500 MG capsule Commonly known as: AMOXIL   atorvastatin  10 MG tablet Commonly known as: LIPITOR   dextromethorphan-guaiFENesin 10-100 MG/5ML liquid Commonly known as: ROBITUSSIN-DM   divalproex  125 MG  DR tablet Commonly known as: DEPAKOTE    escitalopram 20 MG tablet Commonly known as: LEXAPRO   ferrous sulfate  325 (65 FE) MG EC tablet   fish oil-omega-3 fatty acids  1000 MG capsule   HM Lidocaine  Patch 4 % Generic drug: lidocaine    hydrocortisone cream 1 %   lactose free nutrition Liqd   levothyroxine  50 MCG tablet Commonly known as: SYNTHROID    loperamide  2 MG tablet Commonly known as: IMODIUM  A-D   LORazepam  2 MG tablet Commonly known as: ATIVAN    nitroGLYCERIN  0.4 MG SL tablet Commonly known as: NITROSTAT    NUTRITIONAL SHAKE PO   nystatin -triamcinolone  cream Commonly known as: MYCOLOG II   omeprazole 20 MG capsule Commonly known as: PRILOSEC   QUEtiapine  25 MG tablet Commonly known as: SEROquel    simethicone  80 MG chewable tablet Commonly known as: MYLICON       Time spent: 35 minutes.  SignedBETHA Leatrice LILLETTE Rosario 11/14/2023, 2:43 PM

## 2023-11-14 NOTE — Progress Notes (Signed)
 General Surgery  Patient remains confused on exam this morning. Abdomen soft. Evaluated by SLP with recommendations for NPO status. Noted palliative discussions with family, with plans to transition to comfort care. Surgery will sign off at this time, please call with any further questions or concerns.  Leonor Dawn, MD [facility] Surgery General, Hepatobiliary and Pancreatic Surgery 11/14/23 12:38 PM

## 2023-11-15 ENCOUNTER — Encounter: Payer: Self-pay | Admitting: Nurse Practitioner

## 2023-11-15 ENCOUNTER — Non-Acute Institutional Stay (SKILLED_NURSING_FACILITY): Payer: Self-pay | Admitting: Nurse Practitioner

## 2023-11-15 DIAGNOSIS — R627 Adult failure to thrive: Secondary | ICD-10-CM | POA: Diagnosis not present

## 2023-11-15 DIAGNOSIS — Z66 Do not resuscitate: Secondary | ICD-10-CM

## 2023-11-15 DIAGNOSIS — F0393 Unspecified dementia, unspecified severity, with mood disturbance: Secondary | ICD-10-CM

## 2023-11-15 DIAGNOSIS — F03918 Unspecified dementia, unspecified severity, with other behavioral disturbance: Secondary | ICD-10-CM | POA: Diagnosis not present

## 2023-11-15 NOTE — Progress Notes (Unsigned)
 Location:  [facility] Room Number: N016-A Place of Service:  SNF (31) Provider:  [facility] Scarlette Hogston, N.P.   Patient Care Team: Sherlynn Madden, MD as PCP - General (Internal Medicine) Liane Sharyne MATSU, [facility] (Inactive) as Pharmacist (Pharmacist) Charlanne Fredia CROME, MD as Consulting Physician (Internal Medicine)  Extended Emergency Contact Information Primary Emergency Contact: Fitton,Gerry Address: [address]          [address]          [place], [place] [address] [place] Home Phone: [phone] Relation: Spouse Secondary Emergency Contact: Finkler,Larry Address: [address]          [place], [place] [address] [place] Phone: [phone] Relation: Son  Code Status:  DNR Goals of care: Advanced Directive information    11/11/2023    1:57 AM  Advanced Directives  Does Patient Have a Medical Advance Directive? Unable to assess, patient is non-responsive or altered mental status     Chief Complaint  Patient presents with   Hospitalization Follow-up    Follow-up from recent hospital stay     HPI:  Pt is a 89 y.o. male seen today for medication review following hospital stay  Hospitalized 11/11/2023 to 11/14/2023 for incarcerated hiatal hernia, acute gastric volvulus, acute on chronic blood loss anemia.  Presenting symptoms: Coffee ground emesis and generalized weakness.  The patient underwent EGD, decompression of volvulus, fluid aspiration.  The patient was transfused with 2 units of packed red blood cells due to hemoglobin 6.8, improved to 8.3.  The patient remained critically ill, palliative care was consulted, the decision was made to proceed with a comfort care, return to [facility] with hospice service.  Adult failure to thrive, comfort measures, under hospice service.   Dementia, symptomatic management with Depakote , Seroquel  in the past    Past Medical History:  Diagnosis Date   Complication of anesthesia     serious cognisance problems since my OR in January (03/13/2013   Coronary artery disease    Diverticulosis    Hiatal hernia    Hx of echocardiogram    Echo 5/16:  Mild focal basal septal hypertrophy, EF 55%, mild AI, MV repair ok with trivial MR (mean 3 mmHg), mild LAE, mild RAE, PASP 35 mmHg   Hypertension    Hypothyroidism    Irritable bowel syndrome    PAF (paroxysmal atrial fibrillation) (HCC) 05/03/2014   Personal history of colonic polyps    tubular adenoma   Pleural effusion, bilateral 03/12/2013   Small to moderate, L>R   Prostate cancer [facility]) 1991   sees Dr. Narvis at [facility], observation only    S/P CABG x 2 02/28/2013   LIMA to LAD, SVG to OM1, EVH via right thigh   S/P Maze operation for atrial fibrillation 02/28/2013   Complete bilateral atrial lesion set using bipolar radiofrequency and cryothermy ablation with clipping of LA appendage   S/P mitral valve repair, maze procedure, and CABG x2 02/28/2013   Complex valvuloplasty including triangular resection of posterior leaflet, 30 mm Sorin Memo 3D ring annuloplasty   Past Surgical History:  Procedure Laterality Date   CARDIAC CATHETERIZATION  2015   CATARACT EXTRACTION, BILATERAL Bilateral 2014   per Dr. Roz    COLONOSCOPY  2011   per Dr. Jakie, benign polyps, no repeats needed    CORONARY ARTERY BYPASS GRAFT N/A 02/28/2013   Procedure: CORONARY ARTERY BYPASS GRAFTING (CABG);  Surgeon: Sudie VEAR Laine, MD;  Location: [facility] OR;  Service: Open Heart Surgery;  Laterality: N/A;  CABG x 2,  using left internal mammary artery and right leg saphenous vein harvested endoscopically   ESOPHAGOGASTRODUODENOSCOPY N/A 11/12/2023   Procedure: EGD (ESOPHAGOGASTRODUODENOSCOPY);  Surgeon: Elicia Claw, MD;  Location: [facility];  Service: Gastroenterology;  Laterality: N/A;   ESOPHAGOGASTRODUODENOSCOPY (EGD) WITH PROPOFOL  N/A 01/11/2019   Procedure: ESOPHAGOGASTRODUODENOSCOPY (EGD) WITH PROPOFOL ;  Surgeon: Legrand Victory LITTIE DOUGLAS, MD;   Location: [facility];  Service: Gastroenterology;  Laterality: N/A;   HEMORRHOID SURGERY     INTRAOPERATIVE TRANSESOPHAGEAL ECHOCARDIOGRAM N/A 02/28/2013   Procedure: INTRAOPERATIVE TRANSESOPHAGEAL ECHOCARDIOGRAM;  Surgeon: Sudie VEAR Laine, MD;  Location: [facility] OR;  Service: Open Heart Surgery;  Laterality: N/A;   LEFT HEART CATHETERIZATION WITH CORONARY ANGIOGRAM N/A 02/26/2013   Procedure: LEFT HEART CATHETERIZATION WITH CORONARY ANGIOGRAM;  Surgeon: Alm LELON Clay, MD;  Location: [facility] CATH LAB;  Service: Cardiovascular;  Laterality: N/A;   MAZE N/A 02/28/2013   Procedure: MAZE;  Surgeon: Sudie VEAR Laine, MD;  Location: [facility] OR;  Service: Open Heart Surgery;  Laterality: N/A;   MITRAL VALVE REPAIR N/A 02/28/2013   Procedure: MITRAL VALVE REPAIR (MVR);  Surgeon: Sudie VEAR Laine, MD;  Location: [facility] OR;  Service: Open Heart Surgery;  Laterality: N/A;   PROSTATE BIOPSY     TRANSURETHRAL RESECTION OF PROSTATE N/A 02/04/2017   Procedure: TRANSURETHRAL RESECTION OF THE PROSTATE (TURP);  Surgeon: Alvaro Hummer, MD;  Location: [facility] ORS;  Service: Urology;  Laterality: N/A;    Allergies  Allergen Reactions   Other Other (See Comments)    ANESTHESIA.... 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 ANESTHESIA.... 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 Occurred post op CABG 2015, required readmission and rehab    Outpatient Encounter Medications as of 11/15/2023  Medication Sig   feeding supplement (BOOST HIGH PROTEIN) LIQD Take 1 Container by mouth 3 (three) times daily.   LORazepam  (ATIVAN ) 0.5 MG tablet Take 0.5 mg by mouth every 2 (two) hours as needed for anxiety (and agitation).   morphine  20 MG/5ML solution Take by mouth. Give 0.25 ml by mouth every 4 hours as needed for Pain/Dyspnea   No facility-administered encounter medications on file as of 11/15/2023.    Review of Systems  Unable to perform ROS: Dementia    Immunization History  Administered Date(s) Administered    Fluad Quad(high Dose 65+) 11/21/2018, 12/25/2019, 01/02/2021, 12/16/2021, 11/24/2022   INFLUENZA, HIGH DOSE SEASONAL PF 11/11/2014, 11/12/2015, 11/15/2016, 11/18/2017, 11/21/2018, 01/02/2021   Influenza Split 12/15/2006, 12/06/2007, 12/31/2009, 11/01/2011   Influenza Whole 12/15/2006, 12/06/2007, 12/31/2009   Influenza,inj,Quad PF,6+ Mos 11/01/2012, 11/09/2013   Influenza-Unspecified 11/01/2011, 11/01/2012, 11/09/2013, 11/24/2022   Moderna Covid-19 Vaccine Bivalent Booster 7yrs & up 12/08/2022   Moderna SARS-COV2 Booster Vaccination 01/07/2020   Moderna Sars-Covid-2 Vaccination 02/26/2019, 03/26/2019   PFIZER(Purple Top)SARS-COV-2 Vaccination 12/29/2021   PPD Test 10/06/2022   Pneumococcal Conjugate-13 11/09/2013   Pneumococcal Polysaccharide-23 02/23/2004   Td 02/22/2002   Td (Adult),5 Lf Tetanus Toxid, Preservative Free 02/22/2002   Tdap 01/22/2014   Pertinent  Health Maintenance Due  Topic Date Due   Influenza Vaccine  09/23/2023   Colonoscopy  Discontinued      01/25/2022    1:55 PM 02/05/2022    9:54 AM 05/31/2022    3:57 PM 08/11/2022    2:32 PM 12/29/2022    1:38 PM  Fall Risk  Falls in the past year? 1 1 1  0 1  Was there an injury with Fall? 0 0 0  0 0  Fall Risk Category Calculator 2 2 2  0 2  Fall Risk Category (Retired) Moderate  Moderate      (RETIRED) Patient Fall Risk Level Moderate fall risk  Moderate fall risk      Patient at Risk for Falls Due to History of fall(s);Impaired balance/gait;Impaired mobility History of fall(s);Impaired balance/gait;Impaired mobility History of fall(s);Impaired balance/gait;Impaired mobility;Mental status change History of fall(s);Impaired balance/gait;Impaired mobility;Mental status change History of fall(s);Impaired balance/gait;Impaired mobility  Fall risk Follow up Falls evaluation completed  Falls evaluation completed  Falls evaluation completed;Education provided;Falls prevention discussed Falls evaluation completed;Education  provided;Falls prevention discussed Falls evaluation completed;Education provided;Falls prevention discussed     Data saved with a previous flowsheet row definition   Functional Status Survey:    Vitals:   11/15/23 1112  BP: (!) 160/70  Pulse: 82  Temp: 98.1 F (36.7 C)  SpO2: 94%  Weight: 136 lb 8 oz (61.9 kg)  Height: 5' 9 (1.753 m)   Body mass index is 20.16 kg/m. Physical Exam Vitals and nursing note reviewed.  Constitutional:      Comments: Lethargic, restless in bed  HENT:     Head: Normocephalic and atraumatic.     Nose: Nose normal.     Mouth/Throat:     Mouth: Mucous membranes are moist.  Eyes:     Extraocular Movements: Extraocular movements intact.     Conjunctiva/sclera: Conjunctivae normal.     Pupils: Pupils are equal, round, and reactive to light.  Cardiovascular:     Rate and Rhythm: Normal rate and regular rhythm.     Heart sounds: No murmur heard. Pulmonary:     Effort: Pulmonary effort is normal.     Breath sounds: No rales.  Abdominal:     Palpations: Abdomen is soft.     Tenderness: There is no abdominal tenderness.  Genitourinary:    Comments: Excoriated redness in penis and scrotal areas Musculoskeletal:     Cervical back: Normal range of motion and neck supple.  Skin:    General: Skin is warm and dry.     Coloration: Skin is pale.  Neurological:     General: No focal deficit present.     Motor: No weakness.  Psychiatric:     Comments: Restless in bed     Labs reviewed: Recent Labs    11/12/23 0950 11/13/23 0246 11/14/23 0524  NA 149* 153* 151*  K 3.9 3.8 2.9*  CL 111 111 114*  CO2 25 21* 23  GLUCOSE 147* 125* 159*  BUN 83* 115* 98*  CREATININE 1.37* 2.23* 2.12*  CALCIUM  8.6* 8.4* 8.2*  MG 2.1 2.3  --   PHOS 3.7 5.3* 3.8   Recent Labs    09/13/23 0000 11/11/23 0217 11/12/23 0247 11/12/23 0950 11/14/23 0524  AST 22 45* 22  --   --   ALT 19 49* 29  --   --   ALKPHOS 67 58 44  --   --   BILITOT  --  0.4 0.3  --    --   PROT  --  6.0* 4.9*  --   --   ALBUMIN  3.1* 3.4* 2.8* 2.8* 2.8*   Recent Labs    11/12/23 0950 11/12/23 1834 11/13/23 0246 11/13/23 1116 11/13/23 1330 11/13/23 2142 11/14/23 0252  WBC 11.6*  --  11.7*  --   --   --  10.0  NEUTROABS 9.9*  --  10.3*  --   --   --  8.4*  HGB 7.4*   < >  6.8*   < > 6.8* 8.3* 8.5*  HCT 24.1*   < > 22.7*   < > 22.3* 26.7* 27.5*  MCV 99.6  --  103.2*  --   --   --  96.5  PLT 197  --  194  --   --   --  176   < > = values in this interval not displayed.   Lab Results  Component Value Date   TSH 2.75 08/18/2023   Lab Results  Component Value Date   HGBA1C 5.6 07/01/2016   Lab Results  Component Value Date   CHOL 96 08/18/2023   HDL 35 08/18/2023   LDLCALC 43 08/18/2023   LDLDIRECT 159.3 05/17/2007   TRIG 99 08/18/2023   CHOLHDL 2 01/28/2021    Significant Diagnostic Results in last 30 days:  CT ANGIO GI BLEED Addendum Date: 11/11/2023 ADDENDUM REPORT: 11/11/2023 05:28 ADDENDUM: #2 was discussed by telephone with PA [facility] SPONSELLER on 11/11/2023 at 05:28. At 0521 hours. Electronically Signed   By: VEAR Hurst M.D.   On: 11/11/2023 05:28   Result Date: 11/11/2023 CLINICAL DATA:  89 year old male with coffee-ground emesis. Chronic intrathoracic stomach. EXAM: CTA ABDOMEN AND PELVIS WITHOUT AND WITH CONTRAST TECHNIQUE: Multidetector CT imaging of the abdomen and pelvis was performed using the standard protocol during bolus administration of intravenous contrast. Multiplanar reconstructed images and MIPs were obtained and reviewed to evaluate the vascular anatomy. RADIATION DOSE REDUCTION: This exam was performed according to the departmental dose-optimization program which includes automated exposure control, adjustment of the mA and/or kV according to patient size and/or use of iterative reconstruction technique. CONTRAST:  OMNIPAQUE  IOHEXOL  350 MG/ML SOLN COMPARISON:  CT chest today reported separately. CT Abdomen and Pelvis 07/28/2021.  FINDINGS: VASCULAR Extensive Aortoiliac calcified atherosclerosis. Normal caliber abdominal aorta. Major arterial structures in the abdomen and pelvis remain patent despite the atherosclerosis. On delayed phase the portal venous system appears to remain patent. There is some mass effect on the splenic vein from the gastric abnormality, but splenic vein remains patent. No active contrast extravasation into bowel lumen is identified on this exam. Review of the MIP images confirms the above findings. NON-VASCULAR Lower chest: Detailed separately today, with dilated and obstructed stomach secondary to a gastric volvulus redemonstrated. Pre contrast, arterial and portal venous phase postcontrast images are provided. The entire intrathoracic portion of the dilated stomach is not included on these images. But no contrast extravasation into the stomach visible stomach is identified. See additional details on chest CT today. Hepatobiliary: Chronic gallstones. No acute liver or gallbladder finding. Pancreas: Architectural distortion from the gastric abnormality, otherwise negative. Spleen: Spleen is stable and within normal limits for age, with mild post granulomatous calcifications and mildly heterogeneous enhancement. No splenomegaly. Adrenals/Urinary Tract: Negative adrenal glands. Negative right kidney. Chronic left renal double-J ureteral stent, appears stable since 2023. Chronic urinary bladder wall thickening. Stomach/Bowel: Due to gastric volvulus as detailed separately on chest CT today. The compressed gastric outlet and proximal duodenum is redemonstrated on series 18, image 20. From there the duodenum is within normal limits. Nondilated jejunum. Nondilated distal small bowel. Redundant large bowel with diverticulosis and retained stool. But most large bowel loops are decompressed, including the splenic flexure and the transverse colon. Furthermore, the mid transverse colon also appears to be herniated and mildly  twisted above the diaphragm in conjunction with the gastric hernia and volvulus. See coronal images 92 (proximal) and 98 (distal). But the upstream right colon is nondilated. No pneumoperitoneum.  No free fluid in the abdomen. Moderate size stool ball in the rectum, new since 2023. Lymphatic: No lymphadenopathy identified. Reproductive: Negative. Other: Chronic left pelvic sidewall and presacral stranding is stable since 2023. no acute pelvic free fluid. Musculoskeletal: Chronic hyperostosis related thoracic spinal ankylosis. Chronic L5 compression fracture is stable since 2023. Superimposed degenerative and/or posttraumatic lower lumbar facet ankylosis. No acute osseous abnormality identified. IMPRESSION: 1. Acute Gastric Outlet Obstruction secondary to Gastric Volvulus in the setting of chronic large hiatal hernia and intrathoracic stomach. See Chest CT today for additional details of the stomach. 2. NOTE ALSO the midline segment of the Transverse Colon also appears to be herniated and twisted in conjunction with #1. However, there are no dilated or obstructed bowel loops at this time distal to the stomach. No pneumoperitoneum or free fluid. 3. No active gastrointestinal bleeding identified by CTA. 4. No other acute finding in the abdomen or pelvis. Advanced large bowel diverticulosis. Advanced Aortic Atherosclerosis (ICD10-I70.0). Chronic left renal double-J ureteral stent. Chronic L5 compression fracture. Electronically Signed: By: VEAR Hurst M.D. On: 11/11/2023 05:02   CT Chest W Contrast Addendum Date: 11/11/2023 ADDENDUM REPORT: 11/11/2023 05:08 ADDENDUM: Study discussed by telephone with PA [facility] SPONSELLER on 11/11/2023 at 0452 hours. Electronically Signed   By: VEAR Hurst M.D.   On: 11/11/2023 05:08   Result Date: 11/11/2023 CLINICAL DATA:  89 year old male with coffee-ground emesis. Chronic intrathoracic stomach. EXAM: CT CHEST WITH CONTRAST TECHNIQUE: Multidetector CT imaging of the chest was performed  during intravenous contrast administration. RADIATION DOSE REDUCTION: This exam was performed according to the departmental dose-optimization program which includes automated exposure control, adjustment of the mA and/or kV according to patient size and/or use of iterative reconstruction technique. CONTRAST:  OMNIPAQUE  IOHEXOL  350 MG/ML SOLN COMPARISON:  Prior CT Abdomen and Pelvis 07/28/2021. Prior chest CT 03/12/2013. CTA Abdomen and Pelvis today reported separately. FINDINGS: Cardiovascular: Chronic sternotomy, CABG. Chronic atherosclerosis and tortuosity of the thoracic aorta, stable since 2015. Mild cardiomegaly appears increased. No pericardial effusion. Central pulmonary arteries grossly patent. Mediastinum/Nodes: Abnormally dilated gas and fluid containing esophagus which tapers to the gastroesophageal junction. Chronic intrathoracic stomach. New since 07/28/2021 the stomach is diffusely dilated, fluid-filled, and twisted such that a gastric volvulus is present. Previously the gastric antrum was intrathoracic. Now up portion of dilated gas and fluid containing stomach is subdiaphragmatic, and the gastric outlet is severely compressed as seen on series 2, images 54 through 62 and coronal images 54 through 65. There is a small volume of edema or free fluid in the posterior mesentery. No superimposed mediastinal mass or lymphadenopathy. There are small chronic post granulomatous calcified mediastinal and hilar lymph nodes. Lungs/Pleura: Chronic left lung base architectural distortion from intrathoracic stomach. Central airways remain patent but there is left lower lobe consolidation now in the setting of gastric distension. Additional sub solid peribronchovascular nodularity in the right upper lobe (series 4, images 48 through 62). Contralateral left upper lobe mostly dependent ground-glass opacity more resembles atelectasis. No pneumothorax. Small volume mildly complex layering fluid in the left  hemithorax could be in the posterior mediastinum or the pleural space on series 2, image 33. There is no drainable pleural effusion. Upper Abdomen: Dilated gas and fluid containing distal stomach subjacent to the diaphragm now with twisted and compressed gastric outlet (series 2, image 54). No pneumoperitoneum is identified. There is a small volume of left quadrant ascites. Otherwise see CTA Abdomen and Pelvis reported separately. Musculoskeletal: Chronically exaggerated thoracic kyphosis with hyperostosis related  widespread thoracic spinal ankylosis. This is similar to, mildly progressed from the 2015 CT. Chronic sternotomy. No acute or suspicious osseous lesion identified. IMPRESSION: 1. Positive for Acute Gastric Volvulus in the setting of chronic intrathoracic stomach from large chronic hiatal hernia. Deedra this is an organo-axial volvulus. Dilated air and fluid containing stomach and esophagus which - rather than fully intrathoracic as seen in 2023 - distal stomach now is subdiaphragmatic, with twisted, compressed and obstructed gastric outlet (series 2, image 54). 2. Small volume of free fluid in the posterior mediastinum and/or left hemithorax. No pneumomediastinum. No pneumoperitoneum in the visible upper abdomen (see also CTA Abdomen and Pelvis today reported separately.). 3. Associated early bilateral lung infection with right upper lobe dependent bronchopneumonia and left lower lobe atelectasis versus consolidation. Electronically Signed: By: VEAR Hurst M.D. On: 11/11/2023 04:47    Assessment/Plan Adult failure to thrive Continue with lorazepam  every 2 hours as needed, morphine  as needed for comfort per partners  Dementia, senile with depression, with behavioral disturbance (HCC) Lorazepam  every 12 hours as needed as needed for restlessness, agitation     Family/ staff Communication: Plan of care reviewed with the patient, the patient's son-H POA, and charge nurse  Labs/tests ordered:  None

## 2023-11-15 NOTE — Assessment & Plan Note (Signed)
 Continue with lorazepam  every 2 hours as needed, morphine  as needed for comfort per partners

## 2023-11-15 NOTE — Assessment & Plan Note (Signed)
 Lorazepam  every 12 hours as needed as needed for restlessness, agitation

## 2023-11-20 ENCOUNTER — Telehealth: Payer: Self-pay | Admitting: Family

## 2023-11-23 NOTE — Telephone Encounter (Signed)
 Late Entry: Facility Nurse called to report patient is pulseless and no blood pressure.Has DNR in place.Orders given to release to [facility] of choice.

## 2023-11-23 DEATH — deceased
